# Patient Record
Sex: Female | Born: 1985 | Race: Black or African American | Hispanic: No | Marital: Single | Smoking: Never smoker
Health system: Southern US, Community
[De-identification: ages and names within clinical notes are randomized; demographics above are authoritative.]

## PROBLEM LIST (undated history)

## (undated) ENCOUNTER — Inpatient Hospital Stay (HOSPITAL_COMMUNITY): Payer: Self-pay

## (undated) DIAGNOSIS — D696 Thrombocytopenia, unspecified: Secondary | ICD-10-CM

## (undated) DIAGNOSIS — O99119 Other diseases of the blood and blood-forming organs and certain disorders involving the immune mechanism complicating pregnancy, unspecified trimester: Secondary | ICD-10-CM

## (undated) DIAGNOSIS — O24419 Gestational diabetes mellitus in pregnancy, unspecified control: Secondary | ICD-10-CM

## (undated) DIAGNOSIS — E119 Type 2 diabetes mellitus without complications: Secondary | ICD-10-CM

## (undated) DIAGNOSIS — R51 Headache: Secondary | ICD-10-CM

## (undated) DIAGNOSIS — N760 Acute vaginitis: Secondary | ICD-10-CM

## (undated) DIAGNOSIS — IMO0002 Reserved for concepts with insufficient information to code with codable children: Secondary | ICD-10-CM

## (undated) DIAGNOSIS — O1404 Mild to moderate pre-eclampsia, complicating childbirth: Secondary | ICD-10-CM

## (undated) DIAGNOSIS — Z8742 Personal history of other diseases of the female genital tract: Secondary | ICD-10-CM

## (undated) DIAGNOSIS — K429 Umbilical hernia without obstruction or gangrene: Secondary | ICD-10-CM

## (undated) DIAGNOSIS — B9689 Other specified bacterial agents as the cause of diseases classified elsewhere: Secondary | ICD-10-CM

## (undated) DIAGNOSIS — R87619 Unspecified abnormal cytological findings in specimens from cervix uteri: Secondary | ICD-10-CM

## (undated) HISTORY — DX: Mild to moderate pre-eclampsia, complicating childbirth: O14.04

## (undated) HISTORY — PX: WISDOM TOOTH EXTRACTION: SHX21

## (undated) HISTORY — DX: Type 2 diabetes mellitus without complications: E11.9

## (undated) HISTORY — DX: Personal history of other diseases of the female genital tract: Z87.42

## (undated) HISTORY — DX: Thrombocytopenia, unspecified: D69.6

## (undated) HISTORY — DX: Thrombocytopenia, unspecified: O99.119

---
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
## 2006-03-24 ENCOUNTER — Emergency Department (HOSPITAL_COMMUNITY): Admission: EM | Admit: 2006-03-24 | Discharge: 2006-03-25 | Payer: Self-pay | Admitting: Emergency Medicine

## 2006-04-09 ENCOUNTER — Emergency Department (HOSPITAL_COMMUNITY): Admission: EM | Admit: 2006-04-09 | Discharge: 2006-04-09 | Payer: Self-pay | Admitting: Family Medicine

## 2006-04-17 ENCOUNTER — Emergency Department (HOSPITAL_COMMUNITY): Admission: EM | Admit: 2006-04-17 | Discharge: 2006-04-17 | Payer: Self-pay | Admitting: *Deleted

## 2006-08-17 ENCOUNTER — Emergency Department (HOSPITAL_COMMUNITY): Admission: EM | Admit: 2006-08-17 | Discharge: 2006-08-17 | Payer: Self-pay | Admitting: Emergency Medicine

## 2006-09-03 ENCOUNTER — Emergency Department (HOSPITAL_COMMUNITY): Admission: EM | Admit: 2006-09-03 | Discharge: 2006-09-03 | Payer: Self-pay | Admitting: Emergency Medicine

## 2006-09-10 ENCOUNTER — Emergency Department (HOSPITAL_COMMUNITY): Admission: EM | Admit: 2006-09-10 | Discharge: 2006-09-10 | Payer: Self-pay | Admitting: Emergency Medicine

## 2006-12-05 ENCOUNTER — Emergency Department (HOSPITAL_COMMUNITY): Admission: EM | Admit: 2006-12-05 | Discharge: 2006-12-05 | Payer: Self-pay | Admitting: Emergency Medicine

## 2006-12-26 ENCOUNTER — Emergency Department (HOSPITAL_COMMUNITY): Admission: EM | Admit: 2006-12-26 | Discharge: 2006-12-26 | Payer: Self-pay | Admitting: Emergency Medicine

## 2007-01-26 ENCOUNTER — Emergency Department (HOSPITAL_COMMUNITY): Admission: EM | Admit: 2007-01-26 | Discharge: 2007-01-26 | Payer: Self-pay | Admitting: Emergency Medicine

## 2007-01-31 ENCOUNTER — Emergency Department (HOSPITAL_COMMUNITY): Admission: EM | Admit: 2007-01-31 | Discharge: 2007-01-31 | Payer: Self-pay | Admitting: Emergency Medicine

## 2007-04-02 ENCOUNTER — Encounter: Payer: Self-pay | Admitting: Family Medicine

## 2007-04-02 ENCOUNTER — Ambulatory Visit: Payer: Self-pay | Admitting: *Deleted

## 2007-05-13 ENCOUNTER — Emergency Department (HOSPITAL_COMMUNITY): Admission: EM | Admit: 2007-05-13 | Discharge: 2007-05-13 | Payer: Self-pay | Admitting: Emergency Medicine

## 2007-06-17 ENCOUNTER — Emergency Department (HOSPITAL_COMMUNITY): Admission: EM | Admit: 2007-06-17 | Discharge: 2007-06-17 | Payer: Self-pay | Admitting: Emergency Medicine

## 2007-09-19 ENCOUNTER — Emergency Department (HOSPITAL_COMMUNITY): Admission: EM | Admit: 2007-09-19 | Discharge: 2007-09-20 | Payer: Self-pay | Admitting: Emergency Medicine

## 2008-04-24 ENCOUNTER — Emergency Department (HOSPITAL_COMMUNITY): Admission: EM | Admit: 2008-04-24 | Discharge: 2008-04-25 | Payer: Self-pay | Admitting: Emergency Medicine

## 2008-12-16 ENCOUNTER — Encounter (INDEPENDENT_AMBULATORY_CARE_PROVIDER_SITE_OTHER): Payer: Self-pay | Admitting: Family Medicine

## 2008-12-16 ENCOUNTER — Ambulatory Visit: Payer: Self-pay | Admitting: Family Medicine

## 2008-12-17 ENCOUNTER — Ambulatory Visit (HOSPITAL_COMMUNITY): Admission: RE | Admit: 2008-12-17 | Discharge: 2008-12-17 | Payer: Self-pay | Admitting: Obstetrics and Gynecology

## 2008-12-17 ENCOUNTER — Encounter: Payer: Self-pay | Admitting: Obstetrics & Gynecology

## 2008-12-17 LAB — CONVERTED CEMR LAB
ALT: 42 units/L — ABNORMAL HIGH (ref 0–35)
AST: 29 units/L (ref 0–37)
Alkaline Phosphatase: 77 units/L (ref 39–117)
BUN: 7 mg/dL (ref 6–23)
Creatinine, Ser: 0.56 mg/dL (ref 0.40–1.20)
Free T4: 0.93 ng/dL (ref 0.80–1.80)
HCT: 39.5 % (ref 36.0–46.0)
HCV Ab: NEGATIVE
Hemoglobin: 13.1 g/dL (ref 12.0–15.0)
Hep A IgM: NEGATIVE
MCHC: 33.2 g/dL (ref 30.0–36.0)
Platelets: 154 10*3/uL (ref 150–400)
Potassium: 3.9 meq/L (ref 3.5–5.3)
RDW: 15.3 % (ref 11.5–15.5)
TSH: 1.259 microintl units/mL (ref 0.350–4.500)

## 2008-12-20 ENCOUNTER — Emergency Department (HOSPITAL_COMMUNITY): Admission: EM | Admit: 2008-12-20 | Discharge: 2008-12-21 | Payer: Self-pay | Admitting: *Deleted

## 2008-12-23 ENCOUNTER — Emergency Department (HOSPITAL_COMMUNITY): Admission: EM | Admit: 2008-12-23 | Discharge: 2008-12-23 | Payer: Self-pay | Admitting: Family Medicine

## 2009-01-06 ENCOUNTER — Ambulatory Visit: Payer: Self-pay | Admitting: Obstetrics and Gynecology

## 2009-01-07 ENCOUNTER — Encounter: Payer: Self-pay | Admitting: Obstetrics and Gynecology

## 2009-01-07 LAB — CONVERTED CEMR LAB
Basophils Absolute: 0 10*3/uL (ref 0.0–0.1)
Basophils Relative: 0 % (ref 0–1)
Eosinophils Absolute: 0.2 10*3/uL (ref 0.0–0.7)
Hgb A2 Quant: 2.8 % (ref 2.2–3.2)
Hgb A: 97.2 % (ref 96.8–97.8)
MCHC: 33 g/dL (ref 30.0–36.0)
MCV: 81.7 fL (ref 78.0–100.0)
Monocytes Relative: 4 % (ref 3–12)
Neutrophils Relative %: 72 % (ref 43–77)
Platelets: 145 10*3/uL — ABNORMAL LOW (ref 150–400)
RDW: 15.3 % (ref 11.5–15.5)

## 2009-01-19 ENCOUNTER — Emergency Department (HOSPITAL_COMMUNITY): Admission: EM | Admit: 2009-01-19 | Discharge: 2009-01-19 | Payer: Self-pay | Admitting: Emergency Medicine

## 2009-01-20 ENCOUNTER — Inpatient Hospital Stay (HOSPITAL_COMMUNITY): Admission: AD | Admit: 2009-01-20 | Discharge: 2009-01-20 | Payer: Self-pay | Admitting: Obstetrics and Gynecology

## 2009-01-26 ENCOUNTER — Ambulatory Visit: Payer: Self-pay | Admitting: Obstetrics & Gynecology

## 2009-01-26 ENCOUNTER — Encounter: Payer: Self-pay | Admitting: Obstetrics & Gynecology

## 2009-01-26 ENCOUNTER — Encounter: Payer: Self-pay | Admitting: Family

## 2009-01-28 ENCOUNTER — Ambulatory Visit (HOSPITAL_COMMUNITY): Admission: RE | Admit: 2009-01-28 | Discharge: 2009-01-28 | Payer: Self-pay | Admitting: Obstetrics & Gynecology

## 2009-02-02 ENCOUNTER — Ambulatory Visit: Payer: Self-pay | Admitting: Obstetrics and Gynecology

## 2009-02-04 ENCOUNTER — Ambulatory Visit (HOSPITAL_COMMUNITY): Admission: RE | Admit: 2009-02-04 | Discharge: 2009-02-04 | Payer: Self-pay | Admitting: Obstetrics & Gynecology

## 2009-02-14 ENCOUNTER — Ambulatory Visit: Payer: Self-pay | Admitting: Obstetrics & Gynecology

## 2009-02-15 ENCOUNTER — Encounter: Payer: Self-pay | Admitting: Obstetrics & Gynecology

## 2009-03-02 ENCOUNTER — Ambulatory Visit: Payer: Self-pay | Admitting: Obstetrics & Gynecology

## 2009-03-02 LAB — CONVERTED CEMR LAB
MCV: 80.3 fL (ref 78.0–100.0)
Platelets: 98 10*3/uL — ABNORMAL LOW (ref 150–400)
WBC: 11.9 10*3/uL — ABNORMAL HIGH (ref 4.0–10.5)

## 2009-03-04 ENCOUNTER — Ambulatory Visit (HOSPITAL_COMMUNITY): Admission: RE | Admit: 2009-03-04 | Discharge: 2009-03-04 | Payer: Self-pay | Admitting: Family Medicine

## 2009-03-15 ENCOUNTER — Encounter: Admission: RE | Admit: 2009-03-15 | Discharge: 2009-04-18 | Payer: Self-pay | Admitting: Obstetrics & Gynecology

## 2009-03-16 ENCOUNTER — Ambulatory Visit: Payer: Self-pay | Admitting: Obstetrics & Gynecology

## 2009-03-31 ENCOUNTER — Ambulatory Visit: Payer: Self-pay | Admitting: Obstetrics & Gynecology

## 2009-04-07 ENCOUNTER — Ambulatory Visit: Payer: Self-pay | Admitting: Family Medicine

## 2009-04-11 ENCOUNTER — Ambulatory Visit (HOSPITAL_COMMUNITY): Admission: RE | Admit: 2009-04-11 | Discharge: 2009-04-11 | Payer: Self-pay | Admitting: Family Medicine

## 2009-04-11 ENCOUNTER — Ambulatory Visit: Payer: Self-pay | Admitting: Obstetrics & Gynecology

## 2009-04-11 ENCOUNTER — Encounter: Payer: Self-pay | Admitting: Obstetrics & Gynecology

## 2009-04-11 LAB — CONVERTED CEMR LAB
Hemoglobin: 12.2 g/dL (ref 12.0–15.0)
Platelets: 96 10*3/uL — ABNORMAL LOW (ref 150–400)
RDW: 16 % — ABNORMAL HIGH (ref 11.5–15.5)

## 2009-04-14 ENCOUNTER — Ambulatory Visit: Payer: Self-pay | Admitting: Obstetrics & Gynecology

## 2009-04-18 ENCOUNTER — Encounter: Payer: Self-pay | Admitting: Family

## 2009-04-18 ENCOUNTER — Ambulatory Visit: Payer: Self-pay | Admitting: Obstetrics & Gynecology

## 2009-04-21 ENCOUNTER — Ambulatory Visit: Payer: Self-pay | Admitting: Obstetrics & Gynecology

## 2009-04-26 ENCOUNTER — Ambulatory Visit (HOSPITAL_COMMUNITY): Admission: RE | Admit: 2009-04-26 | Discharge: 2009-04-26 | Payer: Self-pay | Admitting: Obstetrics and Gynecology

## 2009-04-26 ENCOUNTER — Ambulatory Visit: Payer: Self-pay | Admitting: Obstetrics & Gynecology

## 2009-04-28 ENCOUNTER — Ambulatory Visit: Payer: Self-pay | Admitting: Obstetrics & Gynecology

## 2009-05-02 ENCOUNTER — Ambulatory Visit: Payer: Self-pay | Admitting: Obstetrics & Gynecology

## 2009-05-02 LAB — CONVERTED CEMR LAB: GC Probe Amp, Genital: NEGATIVE

## 2009-05-03 ENCOUNTER — Encounter: Payer: Self-pay | Admitting: Obstetrics & Gynecology

## 2009-05-05 ENCOUNTER — Observation Stay (HOSPITAL_COMMUNITY): Admission: AD | Admit: 2009-05-05 | Discharge: 2009-05-06 | Payer: Self-pay | Admitting: Family Medicine

## 2009-05-05 ENCOUNTER — Encounter: Payer: Self-pay | Admitting: Family Medicine

## 2009-05-05 ENCOUNTER — Ambulatory Visit: Payer: Self-pay | Admitting: Obstetrics & Gynecology

## 2009-05-05 ENCOUNTER — Ambulatory Visit: Payer: Self-pay | Admitting: Obstetrics and Gynecology

## 2009-05-06 ENCOUNTER — Encounter: Payer: Self-pay | Admitting: Family Medicine

## 2009-05-12 ENCOUNTER — Ambulatory Visit (HOSPITAL_COMMUNITY): Admission: RE | Admit: 2009-05-12 | Discharge: 2009-05-12 | Payer: Self-pay | Admitting: Obstetrics & Gynecology

## 2009-05-12 ENCOUNTER — Ambulatory Visit: Payer: Self-pay | Admitting: Obstetrics & Gynecology

## 2009-05-16 ENCOUNTER — Ambulatory Visit: Payer: Self-pay | Admitting: Obstetrics & Gynecology

## 2009-05-19 ENCOUNTER — Inpatient Hospital Stay (HOSPITAL_COMMUNITY): Admission: RE | Admit: 2009-05-19 | Discharge: 2009-05-21 | Payer: Self-pay | Admitting: Obstetrics & Gynecology

## 2009-05-19 ENCOUNTER — Ambulatory Visit: Payer: Self-pay | Admitting: Advanced Practice Midwife

## 2009-09-10 ENCOUNTER — Emergency Department (HOSPITAL_COMMUNITY): Admission: EM | Admit: 2009-09-10 | Discharge: 2009-09-10 | Payer: Self-pay | Admitting: Emergency Medicine

## 2009-11-24 ENCOUNTER — Emergency Department (HOSPITAL_COMMUNITY): Admission: EM | Admit: 2009-11-24 | Discharge: 2009-11-24 | Payer: Self-pay | Admitting: Family Medicine

## 2009-12-07 ENCOUNTER — Ambulatory Visit: Payer: Self-pay | Admitting: Obstetrics and Gynecology

## 2009-12-08 ENCOUNTER — Encounter: Payer: Self-pay | Admitting: Obstetrics and Gynecology

## 2009-12-08 LAB — CONVERTED CEMR LAB
HCT: 40.8 % (ref 36.0–46.0)
MCV: 81.3 fL (ref 78.0–100.0)
Platelets: 216 10*3/uL (ref 150–400)
RDW: 15.7 % — ABNORMAL HIGH (ref 11.5–15.5)
Yeast Wet Prep HPF POC: NONE SEEN

## 2010-01-04 ENCOUNTER — Ambulatory Visit: Payer: Self-pay | Admitting: Obstetrics & Gynecology

## 2010-02-22 ENCOUNTER — Ambulatory Visit: Payer: Self-pay | Admitting: Obstetrics and Gynecology

## 2010-03-01 ENCOUNTER — Ambulatory Visit: Payer: Self-pay | Admitting: Obstetrics & Gynecology

## 2010-03-01 ENCOUNTER — Other Ambulatory Visit: Admission: RE | Admit: 2010-03-01 | Discharge: 2010-03-01 | Payer: Self-pay | Admitting: Obstetrics & Gynecology

## 2010-03-15 ENCOUNTER — Ambulatory Visit: Payer: Self-pay | Admitting: Obstetrics and Gynecology

## 2010-04-20 ENCOUNTER — Ambulatory Visit: Payer: Self-pay | Admitting: Obstetrics and Gynecology

## 2010-04-21 ENCOUNTER — Encounter: Payer: Self-pay | Admitting: Obstetrics & Gynecology

## 2010-04-21 ENCOUNTER — Encounter: Payer: Self-pay | Admitting: Obstetrics and Gynecology

## 2010-04-21 LAB — CONVERTED CEMR LAB
Chlamydia, DNA Probe: POSITIVE — AB
hCG, Beta Chain, Quant, S: <2 m[IU]/mL

## 2010-04-28 ENCOUNTER — Ambulatory Visit (HOSPITAL_COMMUNITY): Admission: RE | Admit: 2010-04-28 | Discharge: 2010-04-28 | Payer: Self-pay | Admitting: Family Medicine

## 2010-06-23 ENCOUNTER — Emergency Department (HOSPITAL_COMMUNITY): Admission: EM | Admit: 2010-06-23 | Discharge: 2010-06-23 | Payer: Self-pay | Admitting: Emergency Medicine

## 2010-07-31 ENCOUNTER — Ambulatory Visit: Payer: Self-pay | Admitting: Obstetrics and Gynecology

## 2010-08-09 ENCOUNTER — Emergency Department (HOSPITAL_COMMUNITY)
Admission: EM | Admit: 2010-08-09 | Discharge: 2010-08-09 | Payer: Self-pay | Source: Home / Self Care | Admitting: Emergency Medicine

## 2010-08-16 ENCOUNTER — Ambulatory Visit: Payer: Self-pay | Admitting: Obstetrics & Gynecology

## 2010-08-20 NOTE — L&D Delivery Note (Signed)
Delivery Note At 05:59 a.m.  a viable and healthy female was delivered via controlled delivery by the RN , a vertex Presentation singleton infant  ).  APGAR:9  9, ; weight  8 lb 2 oz .  I arrived a 1 min p del, and then Placenta delivered intact, duncan Presentation, with cord blood obtained, 3-V  Cord:  with the following complications:  none .  Cord pH: n/a  Anesthesia: none  Episiotomy: none Lacerations: none  Suture Repair: none Est. Blood Loss (mL): 500  Mom to postpartum.  Baby to nursery-stable.  Loralie Malta V 03/08/2011, 6:15 AM

## 2010-08-31 ENCOUNTER — Emergency Department (HOSPITAL_COMMUNITY)
Admission: EM | Admit: 2010-08-31 | Discharge: 2010-08-31 | Payer: Self-pay | Source: Home / Self Care | Admitting: Emergency Medicine

## 2010-09-04 LAB — URINE CULTURE
Colony Count: NO GROWTH
Culture  Setup Time: 201201121458
Culture: NO GROWTH

## 2010-09-04 LAB — URINALYSIS, ROUTINE W REFLEX MICROSCOPIC
Bilirubin Urine: NEGATIVE
Hgb urine dipstick: NEGATIVE
Ketones, ur: 15 mg/dL — AB
Nitrite: NEGATIVE
Protein, ur: NEGATIVE mg/dL
Specific Gravity, Urine: 1.023 (ref 1.005–1.030)
Urine Glucose, Fasting: NEGATIVE mg/dL
Urobilinogen, UA: 1 mg/dL (ref 0.0–1.0)
pH: 7 (ref 5.0–8.0)

## 2010-09-05 ENCOUNTER — Emergency Department (HOSPITAL_COMMUNITY)
Admission: EM | Admit: 2010-09-05 | Discharge: 2010-09-05 | Payer: Self-pay | Source: Home / Self Care | Admitting: Emergency Medicine

## 2010-09-06 LAB — HEPATIC FUNCTION PANEL
ALT: 27 U/L (ref 0–35)
AST: 27 U/L (ref 0–37)
Albumin: 3 g/dL — ABNORMAL LOW (ref 3.5–5.2)
Alkaline Phosphatase: 58 U/L (ref 39–117)
Bilirubin, Direct: 0.1 mg/dL (ref 0.0–0.3)
Indirect Bilirubin: 0.5 mg/dL (ref 0.3–0.9)
Total Bilirubin: 0.6 mg/dL (ref 0.3–1.2)
Total Protein: 6.8 g/dL (ref 6.0–8.3)

## 2010-09-06 LAB — DIFFERENTIAL
Basophils Absolute: 0 10*3/uL (ref 0.0–0.1)
Basophils Relative: 0 % (ref 0–1)
Eosinophils Absolute: 0.1 10*3/uL (ref 0.0–0.7)
Eosinophils Relative: 1 % (ref 0–5)
Lymphocytes Relative: 14 % (ref 12–46)
Lymphs Abs: 1.2 10*3/uL (ref 0.7–4.0)
Monocytes Absolute: 0.5 10*3/uL (ref 0.1–1.0)
Monocytes Relative: 6 % (ref 3–12)
Neutro Abs: 7.3 10*3/uL (ref 1.7–7.7)
Neutrophils Relative %: 80 % — ABNORMAL HIGH (ref 43–77)

## 2010-09-06 LAB — CBC
HCT: 38.4 % (ref 36.0–46.0)
Hemoglobin: 13.1 g/dL (ref 12.0–15.0)
MCH: 27.7 pg (ref 26.0–34.0)
MCHC: 34.1 g/dL (ref 30.0–36.0)
MCV: 81.2 fL (ref 78.0–100.0)
Platelets: 127 10*3/uL — ABNORMAL LOW (ref 150–400)
RBC: 4.73 MIL/uL (ref 3.87–5.11)
RDW: 15.1 % (ref 11.5–15.5)
WBC: 9.1 10*3/uL (ref 4.0–10.5)

## 2010-09-06 LAB — URINALYSIS, ROUTINE W REFLEX MICROSCOPIC
Bilirubin Urine: NEGATIVE
Hgb urine dipstick: NEGATIVE
Ketones, ur: 40 mg/dL — AB
Nitrite: NEGATIVE
Protein, ur: NEGATIVE mg/dL
Specific Gravity, Urine: 1.027 (ref 1.005–1.030)
Urine Glucose, Fasting: NEGATIVE mg/dL
Urobilinogen, UA: 1 mg/dL (ref 0.0–1.0)
pH: 6 (ref 5.0–8.0)

## 2010-09-06 LAB — BASIC METABOLIC PANEL
BUN: 8 mg/dL (ref 6–23)
CO2: 25 mEq/L (ref 19–32)
Calcium: 8.8 mg/dL (ref 8.4–10.5)
Chloride: 103 mEq/L (ref 96–112)
Creatinine, Ser: 0.57 mg/dL (ref 0.4–1.2)
GFR calc Af Amer: >60 mL/min (ref >60)
GFR calc non Af Amer: >60 mL/min (ref >60)
Glucose, Bld: 127 mg/dL — ABNORMAL HIGH (ref 70–99)
Potassium: 3.3 mEq/L — ABNORMAL LOW (ref 3.5–5.1)
Sodium: 136 mEq/L (ref 135–145)

## 2010-09-06 LAB — URINE MICROSCOPIC-ADD ON

## 2010-09-06 LAB — LIPASE, BLOOD: Lipase: 19 U/L (ref 11–59)

## 2010-09-06 LAB — PREGNANCY, URINE: Preg Test, Ur: POSITIVE

## 2010-09-15 ENCOUNTER — Ambulatory Visit: Admit: 2010-09-15 | Payer: Self-pay | Admitting: Obstetrics & Gynecology

## 2010-09-17 ENCOUNTER — Inpatient Hospital Stay (HOSPITAL_COMMUNITY)
Admission: AD | Admit: 2010-09-17 | Discharge: 2010-09-17 | Payer: Self-pay | Source: Home / Self Care | Attending: Obstetrics & Gynecology | Admitting: Obstetrics & Gynecology

## 2010-09-17 LAB — CBC
MCH: 27.1 pg (ref 26.0–34.0)
Platelets: 128 10*3/uL — ABNORMAL LOW (ref 150–400)
RBC: 4.73 MIL/uL (ref 3.87–5.11)
RDW: 15 % (ref 11.5–15.5)
WBC: 9.7 10*3/uL (ref 4.0–10.5)

## 2010-09-17 LAB — DIFFERENTIAL
Basophils Relative: 0 % (ref 0–1)
Eosinophils Absolute: 0.1 10*3/uL (ref 0.0–0.7)
Monocytes Relative: 5 % (ref 3–12)
Neutrophils Relative %: 73 % (ref 43–77)

## 2010-09-17 LAB — WET PREP, GENITAL: Yeast Wet Prep HPF POC: NONE SEEN

## 2010-09-17 LAB — URINALYSIS, ROUTINE W REFLEX MICROSCOPIC
Protein, ur: NEGATIVE mg/dL
Urobilinogen, UA: 0.2 mg/dL (ref 0.0–1.0)

## 2010-09-18 LAB — GC/CHLAMYDIA PROBE AMP, GENITAL
Chlamydia, DNA Probe: NEGATIVE
GC Probe Amp, Genital: NEGATIVE

## 2010-09-27 ENCOUNTER — Other Ambulatory Visit: Payer: Self-pay | Admitting: Family Medicine

## 2010-09-27 DIAGNOSIS — E669 Obesity, unspecified: Secondary | ICD-10-CM

## 2010-10-11 ENCOUNTER — Ambulatory Visit (HOSPITAL_COMMUNITY)
Admission: RE | Admit: 2010-10-11 | Discharge: 2010-10-11 | Disposition: A | Discharge status: Home / Self Care | Payer: Medicaid Other | Source: Ambulatory Visit | Attending: Family Medicine | Admitting: Family Medicine

## 2010-10-11 DIAGNOSIS — O358XX0 Maternal care for other (suspected) fetal abnormality and damage, not applicable or unspecified: Secondary | ICD-10-CM | POA: Insufficient documentation

## 2010-10-11 DIAGNOSIS — Z363 Encounter for antenatal screening for malformations: Secondary | ICD-10-CM | POA: Insufficient documentation

## 2010-10-11 DIAGNOSIS — E669 Obesity, unspecified: Secondary | ICD-10-CM | POA: Insufficient documentation

## 2010-10-11 DIAGNOSIS — O99210 Obesity complicating pregnancy, unspecified trimester: Secondary | ICD-10-CM | POA: Insufficient documentation

## 2010-10-11 DIAGNOSIS — Z1389 Encounter for screening for other disorder: Secondary | ICD-10-CM | POA: Insufficient documentation

## 2010-10-30 LAB — URINE CULTURE: Colony Count: 50000

## 2010-10-30 LAB — HCG, QUANTITATIVE, PREGNANCY: hCG, Beta Chain, Quant, S: 75275 m[IU]/mL — ABNORMAL HIGH (ref <5)

## 2010-10-30 LAB — URINALYSIS, ROUTINE W REFLEX MICROSCOPIC
Bilirubin Urine: NEGATIVE
Hgb urine dipstick: NEGATIVE
Nitrite: NEGATIVE
Protein, ur: NEGATIVE mg/dL
Urobilinogen, UA: 0.2 mg/dL (ref 0.0–1.0)

## 2010-10-30 LAB — DIFFERENTIAL
Eosinophils Relative: 1 % (ref 0–5)
Lymphocytes Relative: 26 % (ref 12–46)
Lymphs Abs: 2.2 10*3/uL (ref 0.7–4.0)
Monocytes Absolute: 0.5 10*3/uL (ref 0.1–1.0)
Monocytes Relative: 6 % (ref 3–12)

## 2010-10-30 LAB — CBC
HCT: 38.4 % (ref 36.0–46.0)
Hemoglobin: 12.9 g/dL (ref 12.0–15.0)
MCV: 82.1 fL (ref 78.0–100.0)
RBC: 4.68 MIL/uL (ref 3.87–5.11)
WBC: 8.4 10*3/uL (ref 4.0–10.5)

## 2010-10-30 LAB — GC/CHLAMYDIA PROBE AMP, GENITAL: Chlamydia, DNA Probe: NEGATIVE

## 2010-10-31 LAB — URINALYSIS, ROUTINE W REFLEX MICROSCOPIC
Bilirubin Urine: NEGATIVE
Hgb urine dipstick: NEGATIVE
Ketones, ur: NEGATIVE mg/dL
Protein, ur: NEGATIVE mg/dL
Urobilinogen, UA: 0.2 mg/dL (ref 0.0–1.0)

## 2010-10-31 LAB — DIFFERENTIAL
Basophils Relative: 0 % (ref 0–1)
Eosinophils Absolute: 0.1 10*3/uL (ref 0.0–0.7)
Eosinophils Relative: 2 % (ref 0–5)
Monocytes Absolute: 0.5 10*3/uL (ref 0.1–1.0)
Monocytes Relative: 6 % (ref 3–12)
Neutrophils Relative %: 49 % (ref 43–77)

## 2010-10-31 LAB — CBC
HCT: 39.6 % (ref 36.0–46.0)
Hemoglobin: 13.4 g/dL (ref 12.0–15.0)
RDW: 15 % (ref 11.5–15.5)
WBC: 7.7 10*3/uL (ref 4.0–10.5)

## 2010-10-31 LAB — GC/CHLAMYDIA PROBE AMP, GENITAL
Chlamydia, DNA Probe: NEGATIVE
GC Probe Amp, Genital: NEGATIVE

## 2010-10-31 LAB — COMPREHENSIVE METABOLIC PANEL
ALT: 22 U/L (ref 0–35)
Alkaline Phosphatase: 61 U/L (ref 39–117)
Glucose, Bld: 160 mg/dL — ABNORMAL HIGH (ref 70–99)
Potassium: 3.8 mEq/L (ref 3.5–5.1)
Sodium: 140 mEq/L (ref 135–145)
Total Protein: 6.8 g/dL (ref 6.0–8.3)

## 2010-11-02 LAB — POCT PREGNANCY, URINE: Preg Test, Ur: NEGATIVE

## 2010-11-03 ENCOUNTER — Other Ambulatory Visit: Payer: Self-pay | Admitting: Family Medicine

## 2010-11-03 DIAGNOSIS — Z1389 Encounter for screening for other disorder: Secondary | ICD-10-CM

## 2010-11-05 LAB — DIFFERENTIAL
Basophils Absolute: 0 10*3/uL (ref 0.0–0.1)
Eosinophils Absolute: 0.2 10*3/uL (ref 0.0–0.7)
Lymphocytes Relative: 32 % (ref 12–46)
Monocytes Relative: 7 % (ref 3–12)
Neutro Abs: 4.6 10*3/uL (ref 1.7–7.7)
Neutrophils Relative %: 59 % (ref 43–77)

## 2010-11-05 LAB — URINALYSIS, ROUTINE W REFLEX MICROSCOPIC
Bilirubin Urine: NEGATIVE
Glucose, UA: NEGATIVE mg/dL
Hgb urine dipstick: NEGATIVE
Protein, ur: NEGATIVE mg/dL
Urobilinogen, UA: 1 mg/dL (ref 0.0–1.0)

## 2010-11-05 LAB — POCT I-STAT, CHEM 8
BUN: 8 mg/dL (ref 6–23)
Creatinine, Ser: 0.7 mg/dL (ref 0.4–1.2)
Hemoglobin: 15 g/dL (ref 12.0–15.0)
Potassium: 3.5 mEq/L (ref 3.5–5.1)
Sodium: 140 mEq/L (ref 135–145)

## 2010-11-05 LAB — WET PREP, GENITAL
Trich, Wet Prep: NONE SEEN
WBC, Wet Prep HPF POC: NONE SEEN

## 2010-11-05 LAB — POCT PREGNANCY, URINE: Preg Test, Ur: NEGATIVE

## 2010-11-05 LAB — HCG, SERUM, QUALITATIVE: Preg, Serum: NEGATIVE

## 2010-11-05 LAB — CBC
Hemoglobin: 13.9 g/dL (ref 12.0–15.0)
RBC: 4.96 MIL/uL (ref 3.87–5.11)
RDW: 15.5 % (ref 11.5–15.5)

## 2010-11-05 LAB — GC/CHLAMYDIA PROBE AMP, GENITAL
Chlamydia, DNA Probe: NEGATIVE
GC Probe Amp, Genital: NEGATIVE

## 2010-11-07 LAB — POCT PREGNANCY, URINE: Preg Test, Ur: NEGATIVE

## 2010-11-16 ENCOUNTER — Ambulatory Visit (HOSPITAL_COMMUNITY)
Admission: RE | Admit: 2010-11-16 | Discharge: 2010-11-16 | Disposition: A | Discharge status: Home / Self Care | Payer: Medicaid Other | Source: Ambulatory Visit | Attending: Family Medicine | Admitting: Family Medicine

## 2010-11-16 DIAGNOSIS — Z3689 Encounter for other specified antenatal screening: Secondary | ICD-10-CM | POA: Insufficient documentation

## 2010-11-16 DIAGNOSIS — Z1389 Encounter for screening for other disorder: Secondary | ICD-10-CM

## 2010-11-16 DIAGNOSIS — O3660X0 Maternal care for excessive fetal growth, unspecified trimester, not applicable or unspecified: Secondary | ICD-10-CM | POA: Insufficient documentation

## 2010-11-23 LAB — CBC
HCT: 38 % (ref 36.0–46.0)
HCT: 38.3 % (ref 36.0–46.0)
Hemoglobin: 12.5 g/dL (ref 12.0–15.0)
Platelets: 64 10*3/uL — ABNORMAL LOW (ref 150–400)
RBC: 4.43 MIL/uL (ref 3.87–5.11)
RDW: 15.9 % — ABNORMAL HIGH (ref 11.5–15.5)
WBC: 10.5 10*3/uL (ref 4.0–10.5)
WBC: 12.8 10*3/uL — ABNORMAL HIGH (ref 4.0–10.5)

## 2010-11-23 LAB — DIFFERENTIAL
Eosinophils Relative: 0 % (ref 0–5)
Lymphocytes Relative: 16 % (ref 12–46)
Lymphs Abs: 2 10*3/uL (ref 0.7–4.0)
Monocytes Relative: 7 % (ref 3–12)

## 2010-11-24 LAB — GLUCOSE, CAPILLARY
Glucose-Capillary: 101 mg/dL — ABNORMAL HIGH (ref 70–99)
Glucose-Capillary: 76 mg/dL (ref 70–99)
Glucose-Capillary: 90 mg/dL (ref 70–99)

## 2010-11-24 LAB — POCT URINALYSIS DIP (DEVICE)
Bilirubin Urine: NEGATIVE
Bilirubin Urine: NEGATIVE
Glucose, UA: NEGATIVE mg/dL
Hgb urine dipstick: NEGATIVE
Ketones, ur: NEGATIVE mg/dL
Nitrite: NEGATIVE
Protein, ur: NEGATIVE mg/dL
Specific Gravity, Urine: 1.02 (ref 1.005–1.030)
Specific Gravity, Urine: 1.025 (ref 1.005–1.030)
Urobilinogen, UA: 0.2 mg/dL (ref 0.0–1.0)
pH: 6.5 (ref 5.0–8.0)

## 2010-11-24 LAB — CBC
HCT: 41.3 % (ref 36.0–46.0)
Hemoglobin: 13.6 g/dL (ref 12.0–15.0)
MCV: 85.2 fL (ref 78.0–100.0)
Platelets: 60 10*3/uL — ABNORMAL LOW (ref 150–400)
RDW: 15.8 % — ABNORMAL HIGH (ref 11.5–15.5)

## 2010-11-25 LAB — POCT URINALYSIS DIP (DEVICE)
Bilirubin Urine: NEGATIVE
Bilirubin Urine: NEGATIVE
Bilirubin Urine: NEGATIVE
Glucose, UA: NEGATIVE mg/dL
Glucose, UA: NEGATIVE mg/dL
Glucose, UA: NEGATIVE mg/dL
Glucose, UA: NEGATIVE mg/dL
Hgb urine dipstick: NEGATIVE
Ketones, ur: NEGATIVE mg/dL
Ketones, ur: NEGATIVE mg/dL
Ketones, ur: NEGATIVE mg/dL
Nitrite: NEGATIVE
Nitrite: NEGATIVE
Specific Gravity, Urine: 1.025 (ref 1.005–1.030)
Specific Gravity, Urine: 1.025 (ref 1.005–1.030)
Urobilinogen, UA: 0.2 mg/dL (ref 0.0–1.0)
pH: 6.5 (ref 5.0–8.0)
pH: 6.5 (ref 5.0–8.0)

## 2010-11-26 LAB — POCT URINALYSIS DIP (DEVICE)
Bilirubin Urine: NEGATIVE
Bilirubin Urine: NEGATIVE
Glucose, UA: NEGATIVE mg/dL
Hgb urine dipstick: NEGATIVE
Ketones, ur: NEGATIVE mg/dL
Ketones, ur: NEGATIVE mg/dL
Nitrite: NEGATIVE
Specific Gravity, Urine: 1.025 (ref 1.005–1.030)
Specific Gravity, Urine: 1.025 (ref 1.005–1.030)
pH: 6 (ref 5.0–8.0)
pH: 6 (ref 5.0–8.0)

## 2010-11-27 LAB — POCT URINALYSIS DIP (DEVICE)
Bilirubin Urine: NEGATIVE
Glucose, UA: NEGATIVE mg/dL
Glucose, UA: NEGATIVE mg/dL
Hgb urine dipstick: NEGATIVE
Hgb urine dipstick: NEGATIVE
Ketones, ur: NEGATIVE mg/dL
Protein, ur: 30 mg/dL — AB
Specific Gravity, Urine: 1.025 (ref 1.005–1.030)
Specific Gravity, Urine: 1.025 (ref 1.005–1.030)
pH: 6 (ref 5.0–8.0)

## 2010-11-28 LAB — ABO/RH: ABO/RH(D): A POS

## 2010-11-28 LAB — GC/CHLAMYDIA PROBE AMP, GENITAL
Chlamydia, DNA Probe: POSITIVE — AB
GC Probe Amp, Genital: NEGATIVE

## 2010-11-28 LAB — URINALYSIS, ROUTINE W REFLEX MICROSCOPIC
Bilirubin Urine: NEGATIVE
Glucose, UA: NEGATIVE mg/dL
Hgb urine dipstick: NEGATIVE
Specific Gravity, Urine: 1.031 — ABNORMAL HIGH (ref 1.005–1.030)

## 2010-11-28 LAB — TYPE AND SCREEN: ABO/RH(D): A POS

## 2010-11-28 LAB — COMPREHENSIVE METABOLIC PANEL
Alkaline Phosphatase: 71 U/L (ref 39–117)
BUN: 6 mg/dL (ref 6–23)
Calcium: 8.9 mg/dL (ref 8.4–10.5)
Creatinine, Ser: 0.55 mg/dL (ref 0.4–1.2)
Glucose, Bld: 95 mg/dL (ref 70–99)
Total Protein: 6.2 g/dL (ref 6.0–8.3)

## 2010-11-28 LAB — LIPASE, BLOOD: Lipase: 22 U/L (ref 11–59)

## 2010-11-28 LAB — WET PREP, GENITAL: Trich, Wet Prep: NONE SEEN

## 2010-11-28 LAB — URINE CULTURE: Colony Count: 40000

## 2010-11-28 LAB — POCT PREGNANCY, URINE: Preg Test, Ur: POSITIVE

## 2010-12-09 ENCOUNTER — Emergency Department (HOSPITAL_COMMUNITY)
Admission: EM | Admit: 2010-12-09 | Discharge: 2010-12-09 | Disposition: A | Discharge status: Home / Self Care | Payer: Medicaid Other | Attending: Emergency Medicine | Admitting: Emergency Medicine

## 2010-12-09 DIAGNOSIS — X58XXXA Exposure to other specified factors, initial encounter: Secondary | ICD-10-CM | POA: Insufficient documentation

## 2010-12-09 DIAGNOSIS — H5710 Ocular pain, unspecified eye: Secondary | ICD-10-CM | POA: Insufficient documentation

## 2010-12-09 DIAGNOSIS — O99891 Other specified diseases and conditions complicating pregnancy: Secondary | ICD-10-CM | POA: Insufficient documentation

## 2010-12-09 DIAGNOSIS — S058X9A Other injuries of unspecified eye and orbit, initial encounter: Secondary | ICD-10-CM | POA: Insufficient documentation

## 2011-01-02 NOTE — Group Therapy Note (Signed)
NAMECHASADY, LONGWELL NO.:  [id number]   MEDICAL RECORD NO.:  [id number]          PATIENT TYPE:  WOC   LOCATION:  [facility]                   FACILITY:  [facility]   PHYSICIAN:  Allie Bossier, MD        DATE OF BIRTH:  17-Jun-1986   DATE OF SERVICE:  12/16/2008                                  CLINIC NOTE   REASON FOR VISIT:  Annual exam.   HISTORY OF PRESENT ILLNESS:  [name] is a 25 year old  nulligravida patient here for her annual Pap smear examination.  She  relates a history of abnormal menstrual cycles both in time and  duration.  She notes her last cycle was approximately 4 months ago.  She  relates when she does have cycles, they are heavy and accompanied with  moderate cramping.  Also of note, the patient relates that she is not  using any form of birth control and having a partner for the last year  and having unprotected sex without getting pregnant to the best of her  knowledge.   PAST MEDICAL HISTORY:  Negative.   PAST SURGICAL HISTORY:  Negative test.   PAST OBSTETRICAL HISTORY:  Negative.   PAST GYN HISTORY:  She has a history of abnormal Pap smear and  colposcopy which was then followed up by Pap smears.  She also relates a  history of gonorrhea.   ALLERGIES:  she has no known drug allergies.   SOCIAL HISTORY:  She denies any tobacco or alcohol use.   REVIEW OF SYSTEMS:  Negative.   PHYSICAL EXAM:  VITAL SIGNS:  Reviewed and they are normal.  She is 5  feet 6 inches tall and weighs 256 pounds, which corresponds to a BMI of  41.3.  She is healthy-appearing in no acute distress.  NECK:  Supple without evidence of adenopathy or thyromegaly.  LUNGS:  Clear.  CARDIOVASCULAR EXAM:  Regular without murmur.  ABDOMEN:  Soft, obese, nontender, no hepatosplenomegaly appreciated.   GU EXAM:  Her external female genitalia are normal-appearing.  Her  vagina shows normal rugae.  Her cervix is nulliparous, without lesion.  There is some mild  mucoid vaginal discharge.  Bimanual exam:  It was  difficult to feel any of her internal organs due to her body habitus.   ASSESSMENT/PLAN:  1. Well-woman exam.  2. Menometrorrhagia.  I have a  high suspicion that this is probably      related to polycystic ovarian syndrome given the patient's habitus      and her infertility as well as her abnormal menstrual cycles.  I      will check CBC, CMP and TSH today and have the patient return in 2      weeks.  If the labs are unremarkable, I would consider starting her      on metformin 5 mg twice daily to increase to 1000 mg after 1 month.      Pap smear was also collected today and the patient will be notified      of the results.     ______________________________  Monique Schmidt, D.O.  ______________________________  Allie Bossier, MD    MC/MEDQ  D:  12/16/2008  T:  12/16/2008  Job:  [id number]   cc:   Allie Bossier, MD

## 2011-01-02 NOTE — Group Therapy Note (Signed)
NAME:  Monique Schmidt, EVERITT NO.:  [id number]   MEDICAL RECORD NO.:  [id number]          PATIENT TYPE:  WOC   LOCATION[facility]  [facility]                   FACILITY:  [facility]   PHYSICIAN:  Wilburt Finlay, M.D.     DATE OF BIRTH:  05-27-1986   DATE OF SERVICE:  04/02/2007                                  CLINIC NOTE   CHIEF COMPLAINT:  The patient presents for routine GYN exam.  Also  thinks she might be pregnant.   HISTORY OF PRESENT ILLNESS:  The patient is a 25 year old nulligravida  female who presents for annual GYN exam.  The patient also thinks that  she may be pregnant.  Reports is the partner of 4 months and has been  trying to get pregnant.  She reportedly has regular periods up until  last month when she had a period for 1 day, has not had a period since.  Has taken 2 home pregnancy tests, 1 of which was positive, the other  negative.  Pregnancy test done here at the office was negative.  The  patient denies any abnormal vaginal discharge, no vaginal itching, no  breast tenderness, no significant leaking within the last couple of  months.  She reports using condoms sometimes for contraception.  Has  never tried any other form of birth control in the past.  Her  gynecological history is significant for an abnormal Pap smear in  February, 2007, which she had done at a private physician's office.  Reports having a colposcopy done that same month and a repeat Pap smear  that was normal.  She may have had a biopsy of the cervix done.  It was  not clear on questioning of the patient.   PAST MEDICAL HISTORY:  Noncontributory.   PAST SURGICAL HISTORY:  Noncontributory.   FAMILY HISTORY:  Significant for diabetes in her mother and father.  Father with a heart attack, high blood pressure and the mother with  throat cancer.   SOCIAL HISTORY:  The patient lives by herself.  Denies smoking, drugs or  alcohol use.   ALLERGIES:  No known drug allergies.   CURRENT MEDICATIONS:   None.   REVIEW OF SYSTEMS:  Negative for no nausea, vomiting, no fevers, chills,  no abnormal abdominal pain, no chest pain, shortness of breath.  No  abnormal vaginal bleeding other than 1 abnormal period last month.  No  abnormal vaginal discharge.   ON EXAM:  VITAL SIGNS:  Her temperature is 98.4, pulse 84, blood  pressure 123/86, weight 281.3 lb, height 5 feet 6 inches.  GENERAL:  The patient appears in no apparent distress.  Is morbidly  obese.  HEENT:  Within normal limits.  CV:  Regular rate and rhythm.  No murmurs appreciated.  LUNGS:  Clear to auscultation bilaterally.  ABDOMEN:  Soft, nontender, very obese.  No palpable masses appreciated.  PELVIC:  Normal external genitalia.  Normal vaginal mucosa, Normal  cervix.  No cervical motion tenderness noted.  No adnexal fullness,  masses or tenderness noted.  Uterus appears to be small and mobile,  although somewhat limited by body habitus.  BREAST:  The patient with very large breasts, appeared nontender and no  masses __________ .  EXTREMITIES:  No edema.   ASSESSMENT/PLAN:  A 25 year old nulligravida female in for routine  gynecological exam.   PLAN:  Pregnancy test done today was negative.  Pap smear, gonorrhea,  chlamydia done today.  The patient encouraged to take multivitamin daily  including folic acid since she does intend to become pregnant.  She is  to increase her exercise level and try to lose some weight.  The patient  is also to keep a record of her menstrual periods, especially since she  plans on becoming pregnant.  If her amenorrhea continues and the patient  is concerned, she is asked to follow up with [place] to further evaluate her  amenorrhea.  Otherwise, return to clinic as needed.           ______________________________  Wilburt Finlay, M.D.     LJ/MEDQ  D:  04/02/2007  T:  04/03/2007  Job:  [id number]

## 2011-01-02 NOTE — Assessment & Plan Note (Signed)
NAME:  ALYZE, LAUF NO.:  [id number]   MEDICAL RECORD NO.:  [id number]          PATIENT TYPE:  WOC   LOCATION:  [facility]         FACILITY:  [facility]   PHYSICIAN:  Caren Griffins, CNM       DATE OF BIRTH:  1986/03/12   DATE OF SERVICE:  03/15/2010                                  CLINIC NOTE   REASON FOR VISIT:  Results.   HISTORY:  This is a 25 year old G1, P1-0-0-1, who has a 27-month-old and  is morbidly obese.  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  Her  endocervix showed benign endocervical glands.  These results were shared  with her and explained and she understands the need to get another Pap  smear in 6 months.   She is very anxious about becoming pregnant.  She is wanting to become  pregnant.  She says her baby daddy wants her to become pregnant as soon  as possible and she has been trying for 5-6 months to no avail.   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  Followup Pap in 6 months, had a long discussion with  her about the benefits of waiting until she has a normal Pap smear  before attempting pregnancy.  However, she is against doing this and for  that reason is given a prescription for prenatal vitamins to begin  taking one a day.  We also explained ovulation and gave her a  prescription for basal body temperature if she wants to do that.  We  also discussed the benefits of weight loss and the risk of morbid  obesity in pregnancy.  She agrees to return for another Pap in 6 months.           ______________________________  Caren Griffins, CNM     DP/MEDQ  D:  03/15/2010  T:  03/16/2010  Job:  [id number]

## 2011-01-31 ENCOUNTER — Encounter: Payer: Medicaid Other | Attending: Obstetrics and Gynecology

## 2011-01-31 DIAGNOSIS — O99810 Abnormal glucose complicating pregnancy: Secondary | ICD-10-CM | POA: Insufficient documentation

## 2011-01-31 DIAGNOSIS — Z713 Dietary counseling and surveillance: Secondary | ICD-10-CM | POA: Insufficient documentation

## 2011-02-02 ENCOUNTER — Inpatient Hospital Stay (HOSPITAL_COMMUNITY)
Admission: AD | Admit: 2011-02-02 | Discharge: 2011-02-03 | Disposition: A | Discharge status: Home / Self Care | Payer: Medicaid Other | Source: Ambulatory Visit | Attending: Obstetrics and Gynecology | Admitting: Obstetrics and Gynecology

## 2011-02-02 DIAGNOSIS — O2390 Unspecified genitourinary tract infection in pregnancy, unspecified trimester: Secondary | ICD-10-CM

## 2011-02-02 DIAGNOSIS — N390 Urinary tract infection, site not specified: Secondary | ICD-10-CM | POA: Insufficient documentation

## 2011-02-02 LAB — URINALYSIS, ROUTINE W REFLEX MICROSCOPIC
Ketones, ur: 15 mg/dL — AB
Nitrite: NEGATIVE
pH: 6.5 (ref 5.0–8.0)

## 2011-02-02 LAB — WET PREP, GENITAL: Yeast Wet Prep HPF POC: NONE SEEN

## 2011-02-02 LAB — URINE MICROSCOPIC-ADD ON

## 2011-02-03 LAB — GC/CHLAMYDIA PROBE AMP, GENITAL: Chlamydia, DNA Probe: NEGATIVE

## 2011-02-05 ENCOUNTER — Encounter: Payer: Medicaid Other | Admitting: Dietician

## 2011-02-05 ENCOUNTER — Other Ambulatory Visit: Payer: Self-pay | Admitting: Obstetrics & Gynecology

## 2011-02-05 DIAGNOSIS — O99810 Abnormal glucose complicating pregnancy: Secondary | ICD-10-CM

## 2011-02-05 DIAGNOSIS — IMO0002 Reserved for concepts with insufficient information to code with codable children: Secondary | ICD-10-CM

## 2011-02-05 LAB — URINE CULTURE
Colony Count: 85000
Culture  Setup Time: 201206160117

## 2011-02-05 LAB — POCT URINALYSIS DIP (DEVICE)
Glucose, UA: NEGATIVE mg/dL
Nitrite: NEGATIVE
Protein, ur: NEGATIVE mg/dL
Urobilinogen, UA: 0.2 mg/dL (ref 0.0–1.0)
pH: 6.5 (ref 5.0–8.0)

## 2011-02-08 ENCOUNTER — Other Ambulatory Visit: Payer: Self-pay | Admitting: Obstetrics & Gynecology

## 2011-02-08 ENCOUNTER — Other Ambulatory Visit: Payer: Medicaid Other

## 2011-02-08 DIAGNOSIS — O99340 Other mental disorders complicating pregnancy, unspecified trimester: Secondary | ICD-10-CM

## 2011-02-08 DIAGNOSIS — O24419 Gestational diabetes mellitus in pregnancy, unspecified control: Secondary | ICD-10-CM

## 2011-02-08 LAB — STREP B DNA PROBE: GBS: NEGATIVE

## 2011-02-12 ENCOUNTER — Other Ambulatory Visit: Payer: Self-pay | Admitting: Family Medicine

## 2011-02-12 DIAGNOSIS — O99340 Other mental disorders complicating pregnancy, unspecified trimester: Secondary | ICD-10-CM

## 2011-02-12 LAB — POCT URINALYSIS DIP (DEVICE)
Hgb urine dipstick: NEGATIVE
Nitrite: NEGATIVE
Protein, ur: 30 mg/dL — AB
Urobilinogen, UA: 0.2 mg/dL (ref 0.0–1.0)
pH: 6.5 (ref 5.0–8.0)

## 2011-02-15 ENCOUNTER — Ambulatory Visit (HOSPITAL_COMMUNITY): Payer: Medicaid Other

## 2011-02-15 ENCOUNTER — Other Ambulatory Visit: Payer: Medicaid Other

## 2011-02-15 ENCOUNTER — Ambulatory Visit (HOSPITAL_COMMUNITY)
Admission: RE | Admit: 2011-02-15 | Discharge: 2011-02-15 | Disposition: A | Discharge status: Home / Self Care | Payer: Medicaid Other | Source: Ambulatory Visit | Attending: Obstetrics & Gynecology | Admitting: Obstetrics & Gynecology

## 2011-02-15 DIAGNOSIS — O3660X0 Maternal care for excessive fetal growth, unspecified trimester, not applicable or unspecified: Secondary | ICD-10-CM | POA: Insufficient documentation

## 2011-02-15 DIAGNOSIS — O99810 Abnormal glucose complicating pregnancy: Secondary | ICD-10-CM | POA: Insufficient documentation

## 2011-02-15 DIAGNOSIS — O24419 Gestational diabetes mellitus in pregnancy, unspecified control: Secondary | ICD-10-CM

## 2011-02-19 ENCOUNTER — Encounter: Payer: Medicaid Other | Attending: Obstetrics and Gynecology | Admitting: Dietician

## 2011-02-19 ENCOUNTER — Other Ambulatory Visit: Payer: Medicaid Other

## 2011-02-19 ENCOUNTER — Other Ambulatory Visit: Payer: Self-pay | Admitting: Obstetrics & Gynecology

## 2011-02-19 DIAGNOSIS — O0930 Supervision of pregnancy with insufficient antenatal care, unspecified trimester: Secondary | ICD-10-CM

## 2011-02-19 DIAGNOSIS — O99810 Abnormal glucose complicating pregnancy: Secondary | ICD-10-CM | POA: Insufficient documentation

## 2011-02-19 DIAGNOSIS — Z713 Dietary counseling and surveillance: Secondary | ICD-10-CM | POA: Insufficient documentation

## 2011-02-19 LAB — POCT URINALYSIS DIP (DEVICE)
Nitrite: NEGATIVE
Protein, ur: NEGATIVE mg/dL
pH: 6.5 (ref 5.0–8.0)

## 2011-02-22 ENCOUNTER — Other Ambulatory Visit: Payer: Medicaid Other

## 2011-02-22 DIAGNOSIS — O99810 Abnormal glucose complicating pregnancy: Secondary | ICD-10-CM

## 2011-02-26 ENCOUNTER — Other Ambulatory Visit: Payer: Self-pay | Admitting: Obstetrics & Gynecology

## 2011-02-26 ENCOUNTER — Ambulatory Visit: Payer: Medicaid Other | Admitting: Family Medicine

## 2011-02-26 DIAGNOSIS — O99810 Abnormal glucose complicating pregnancy: Secondary | ICD-10-CM

## 2011-02-26 DIAGNOSIS — O0930 Supervision of pregnancy with insufficient antenatal care, unspecified trimester: Secondary | ICD-10-CM

## 2011-02-26 LAB — POCT URINALYSIS DIP (DEVICE)
Protein, ur: NEGATIVE mg/dL
Specific Gravity, Urine: 1.02 (ref 1.005–1.030)
Urobilinogen, UA: 0.2 mg/dL (ref 0.0–1.0)
pH: 6.5 (ref 5.0–8.0)

## 2011-03-01 ENCOUNTER — Ambulatory Visit (INDEPENDENT_AMBULATORY_CARE_PROVIDER_SITE_OTHER): Payer: Medicaid Other | Admitting: Family Medicine

## 2011-03-01 DIAGNOSIS — O99810 Abnormal glucose complicating pregnancy: Secondary | ICD-10-CM

## 2011-03-01 LAB — FETAL NONSTRESS TEST

## 2011-03-05 ENCOUNTER — Ambulatory Visit (HOSPITAL_COMMUNITY)
Admission: RE | Admit: 2011-03-05 | Discharge: 2011-03-05 | Disposition: A | Discharge status: Home / Self Care | Payer: Medicaid Other | Source: Ambulatory Visit | Attending: Family Medicine | Admitting: Family Medicine

## 2011-03-05 ENCOUNTER — Other Ambulatory Visit: Payer: Self-pay | Admitting: Family Medicine

## 2011-03-05 ENCOUNTER — Ambulatory Visit (INDEPENDENT_AMBULATORY_CARE_PROVIDER_SITE_OTHER): Payer: Medicaid Other | Admitting: Family Medicine

## 2011-03-05 DIAGNOSIS — O99810 Abnormal glucose complicating pregnancy: Secondary | ICD-10-CM

## 2011-03-05 DIAGNOSIS — O368390 Maternal care for abnormalities of the fetal heart rate or rhythm, unspecified trimester, not applicable or unspecified: Secondary | ICD-10-CM | POA: Insufficient documentation

## 2011-03-05 DIAGNOSIS — IMO0002 Reserved for concepts with insufficient information to code with codable children: Secondary | ICD-10-CM

## 2011-03-05 DIAGNOSIS — O24919 Unspecified diabetes mellitus in pregnancy, unspecified trimester: Secondary | ICD-10-CM

## 2011-03-05 DIAGNOSIS — Z3689 Encounter for other specified antenatal screening: Secondary | ICD-10-CM | POA: Insufficient documentation

## 2011-03-05 LAB — POCT URINALYSIS DIP (DEVICE)
Hgb urine dipstick: NEGATIVE
Ketones, ur: NEGATIVE mg/dL
Protein, ur: NEGATIVE mg/dL
pH: 6 (ref 5.0–8.0)

## 2011-03-07 ENCOUNTER — Inpatient Hospital Stay (HOSPITAL_COMMUNITY): Admission: RE | Admit: 2011-03-07 | Payer: Medicaid Other | Source: Ambulatory Visit

## 2011-03-07 ENCOUNTER — Encounter (HOSPITAL_COMMUNITY): Payer: Self-pay | Admitting: *Deleted

## 2011-03-07 NOTE — Plan of Care (Signed)
Spoke with Dorene Grebe, CNM regarding patient being here for induction of labor prior to her scheduled time and bed availability. Per Dorene Grebe, OK to go have some dinner and check back afterwards. Will decide the plan after she returns from dinner.

## 2011-03-08 ENCOUNTER — Inpatient Hospital Stay (HOSPITAL_COMMUNITY)
Admission: AD | Admit: 2011-03-08 | Discharge: 2011-03-10 | DRG: 774 | Disposition: A | Discharge status: Home / Self Care | Payer: Medicaid Other | Source: Ambulatory Visit | Attending: Obstetrics and Gynecology | Admitting: Obstetrics and Gynecology

## 2011-03-08 ENCOUNTER — Encounter (HOSPITAL_COMMUNITY): Payer: Self-pay | Admitting: *Deleted

## 2011-03-08 DIAGNOSIS — D689 Coagulation defect, unspecified: Secondary | ICD-10-CM | POA: Diagnosis not present

## 2011-03-08 DIAGNOSIS — D696 Thrombocytopenia, unspecified: Secondary | ICD-10-CM | POA: Diagnosis not present

## 2011-03-08 DIAGNOSIS — O9913 Other diseases of the blood and blood-forming organs and certain disorders involving the immune mechanism complicating the puerperium: Secondary | ICD-10-CM | POA: Diagnosis not present

## 2011-03-08 DIAGNOSIS — O99814 Abnormal glucose complicating childbirth: Secondary | ICD-10-CM

## 2011-03-08 HISTORY — DX: Gestational diabetes mellitus in pregnancy, unspecified control: O24.419

## 2011-03-08 HISTORY — DX: Reserved for concepts with insufficient information to code with codable children: IMO0002

## 2011-03-08 HISTORY — DX: Unspecified abnormal cytological findings in specimens from cervix uteri: R87.619

## 2011-03-08 LAB — CBC
HCT: 37.3 % (ref 36.0–46.0)
HCT: 39.4 % (ref 36.0–46.0)
Hemoglobin: 13.2 g/dL (ref 12.0–15.0)
MCHC: 33.5 g/dL (ref 30.0–36.0)
Platelets: 60 10*3/uL — ABNORMAL LOW (ref 150–400)
RBC: 4.68 MIL/uL (ref 3.87–5.11)
RBC: 4.95 MIL/uL (ref 3.87–5.11)
RDW: 15.9 % — ABNORMAL HIGH (ref 11.5–15.5)
WBC: 8.2 10*3/uL (ref 4.0–10.5)

## 2011-03-08 LAB — COMPREHENSIVE METABOLIC PANEL
Alkaline Phosphatase: 162 U/L — ABNORMAL HIGH (ref 39–117)
BUN: 10 mg/dL (ref 6–23)
CO2: 20 mEq/L (ref 19–32)
GFR calc Af Amer: >60 mL/min (ref >60)
GFR calc non Af Amer: >60 mL/min (ref >60)
Glucose, Bld: 152 mg/dL — ABNORMAL HIGH (ref 70–99)
Potassium: 3.5 mEq/L (ref 3.5–5.1)
Total Bilirubin: 0.2 mg/dL — ABNORMAL LOW (ref 0.3–1.2)
Total Protein: 7.1 g/dL (ref 6.0–8.3)

## 2011-03-08 LAB — GLUCOSE, CAPILLARY
Glucose-Capillary: 209 mg/dL — ABNORMAL HIGH (ref 70–99)
Glucose-Capillary: 148 mg/dL — ABNORMAL HIGH (ref 70–99)
Glucose-Capillary: 175 mg/dL — ABNORMAL HIGH (ref 70–99)
Glucose-Capillary: 128 mg/dL — ABNORMAL HIGH (ref 70–99)

## 2011-03-08 LAB — RPR: RPR: NONREACTIVE

## 2011-03-08 MED ORDER — TERBUTALINE SULFATE 1 MG/ML IJ SOLN: 0.2500 mg | Freq: Once | INTRAMUSCULAR | Status: DC | PRN

## 2011-03-08 MED ORDER — ONDANSETRON HCL 4 MG/2ML IJ SOLN: 4.0000 mg | INTRAMUSCULAR | Status: DC | PRN

## 2011-03-08 MED ORDER — LACTATED RINGERS IV SOLN: 500.0000 mL | Freq: Once | INTRAVENOUS | Status: DC

## 2011-03-08 MED ORDER — BENZOCAINE-MENTHOL 20-0.5 % EX AERO
1.0000 "application " | INHALATION_SPRAY | CUTANEOUS | Status: DC | PRN
  Administered 2011-03-08: 19:00:00 via TOPICAL

## 2011-03-08 MED ORDER — ACETAMINOPHEN 325 MG PO TABS: 650.0000 mg | ORAL_TABLET | ORAL | Status: DC | PRN

## 2011-03-08 MED ORDER — EPHEDRINE 5 MG/ML INJ
10.0000 mg | INTRAVENOUS | Status: DC | PRN
  Filled 2011-03-08: qty 4

## 2011-03-08 MED ORDER — ONDANSETRON HCL 4 MG/2ML IJ SOLN: 4.0000 mg | Freq: Four times a day (QID) | INTRAMUSCULAR | Status: DC | PRN

## 2011-03-08 MED ORDER — DEXTROSE IN LACTATED RINGERS 5 % IV SOLN: INTRAVENOUS | Status: DC | PRN

## 2011-03-08 MED ORDER — SODIUM CHLORIDE 0.9 % IV SOLN
INTRAVENOUS | Status: DC
  Administered 2011-03-08: 0.8 [IU]/h via INTRAVENOUS
  Filled 2011-03-08: qty 1

## 2011-03-08 MED ORDER — SODIUM CHLORIDE 0.9 % IJ SOLN: 3.0000 mL | INTRAMUSCULAR | Status: DC | PRN

## 2011-03-08 MED ORDER — PRENATAL PLUS 27-1 MG PO TABS
1.0000 | ORAL_TABLET | Freq: Every day | ORAL | Status: DC
  Administered 2011-03-08 – 2011-03-10 (×3): 1 via ORAL
  Filled 2011-03-08 (×3): qty 1

## 2011-03-08 MED ORDER — SODIUM CHLORIDE 0.9 % IV SOLN: 250.0000 mL | INTRAVENOUS | Status: DC

## 2011-03-08 MED ORDER — LACTATED RINGERS IV SOLN
INTRAVENOUS | Status: DC
  Administered 2011-03-08: 500 mL via INTRAVENOUS
  Administered 2011-03-08 (×2): 1000 mL via INTRAVENOUS

## 2011-03-08 MED ORDER — LANOLIN HYDROUS EX OINT: TOPICAL_OINTMENT | CUTANEOUS | Status: DC | PRN

## 2011-03-08 MED ORDER — OXYTOCIN 20 UNITS IN LACTATED RINGERS INFUSION - SIMPLE: 125.0000 mL/h | INTRAVENOUS | Status: DC | PRN

## 2011-03-08 MED ORDER — SIMETHICONE 80 MG PO CHEW: 80.0000 mg | CHEWABLE_TABLET | ORAL | Status: DC | PRN

## 2011-03-08 MED ORDER — LIDOCAINE HCL (PF) 1 % IJ SOLN
30.0000 mL | INTRAMUSCULAR | Status: DC | PRN
  Filled 2011-03-08 (×2): qty 30

## 2011-03-08 MED ORDER — DEXTROSE 50 % IV SOLN: 25.0000 mL | INTRAVENOUS | Status: DC | PRN

## 2011-03-08 MED ORDER — OXYCODONE-ACETAMINOPHEN 5-325 MG PO TABS
1.0000 | ORAL_TABLET | ORAL | Status: DC | PRN
  Administered 2011-03-08: 2 via ORAL
  Administered 2011-03-08 – 2011-03-09 (×2): 1 via ORAL
  Administered 2011-03-09: 2 via ORAL
  Administered 2011-03-09 – 2011-03-10 (×5): 1 via ORAL
  Filled 2011-03-08: qty 1
  Filled 2011-03-08: qty 2
  Filled 2011-03-08 (×5): qty 1
  Filled 2011-03-08: qty 2
  Filled 2011-03-08: qty 1

## 2011-03-08 MED ORDER — DEXTROSE-NACL 5-0.45 % IV SOLN: INTRAVENOUS | Status: DC

## 2011-03-08 MED ORDER — PHENYLEPHRINE 40 MCG/ML (10ML) SYRINGE FOR IV PUSH (FOR BLOOD PRESSURE SUPPORT)
80.0000 ug | PREFILLED_SYRINGE | INTRAVENOUS | Status: DC | PRN
  Filled 2011-03-08: qty 5

## 2011-03-08 MED ORDER — SODIUM CHLORIDE 0.9 % IV SOLN: INTRAVENOUS | Status: DC

## 2011-03-08 MED ORDER — ZOLPIDEM TARTRATE 10 MG PO TABS
10.0000 mg | ORAL_TABLET | Freq: Every evening | ORAL | Status: DC | PRN
  Administered 2011-03-08: 10 mg via ORAL
  Filled 2011-03-08: qty 1

## 2011-03-08 MED ORDER — MISOPROSTOL 25 MCG QUARTER TABLET
25.0000 ug | ORAL_TABLET | ORAL | Status: DC | PRN
  Administered 2011-03-08: 25 ug via VAGINAL
  Filled 2011-03-08: qty 0.25
  Filled 2011-03-08: qty 1

## 2011-03-08 MED ORDER — IBUPROFEN 600 MG PO TABS
600.0000 mg | ORAL_TABLET | Freq: Four times a day (QID) | ORAL | Status: DC
  Administered 2011-03-08 – 2011-03-10 (×7): 600 mg via ORAL
  Filled 2011-03-08 (×6): qty 1

## 2011-03-08 MED ORDER — SENNOSIDES-DOCUSATE SODIUM 8.6-50 MG PO TABS
1.0000 | ORAL_TABLET | Freq: Every day | ORAL | Status: DC
Start: 2011-03-08 — End: 2011-03-10
  Administered 2011-03-08: 2 via ORAL

## 2011-03-08 MED ORDER — TETANUS-DIPHTH-ACELL PERTUSSIS 5-2.5-18.5 LF-MCG/0.5 IM SUSP
0.5000 mL | Freq: Once | INTRAMUSCULAR | Status: DC
  Filled 2011-03-08: qty 0.5

## 2011-03-08 MED ORDER — ZOLPIDEM TARTRATE 5 MG PO TABS: 5.0000 mg | ORAL_TABLET | Freq: Every evening | ORAL | Status: DC | PRN

## 2011-03-08 MED ORDER — FLEET ENEMA 7-19 GM/118ML RE ENEM: 1.0000 | ENEMA | RECTAL | Status: DC | PRN

## 2011-03-08 MED ORDER — WITCH HAZEL-GLYCERIN EX PADS: MEDICATED_PAD | CUTANEOUS | Status: DC | PRN

## 2011-03-08 MED ORDER — LACTATED RINGERS IV SOLN
500.0000 mL | INTRAVENOUS | Status: AC | PRN
  Administered 2011-03-08: 500 mL via INTRAVENOUS

## 2011-03-08 MED ORDER — OXYTOCIN 10 UNIT/ML IJ SOLN
INTRAMUSCULAR | Status: AC
  Administered 2011-03-08: 10 [IU] via INTRAMUSCULAR
  Filled 2011-03-08: qty 1

## 2011-03-08 MED ORDER — BENZOCAINE-MENTHOL 20-0.5 % EX AERO
INHALATION_SPRAY | CUTANEOUS | Status: AC
  Filled 2011-03-08: qty 56

## 2011-03-08 MED ORDER — SODIUM CHLORIDE 0.9 % IJ SOLN: 3.0000 mL | Freq: Two times a day (BID) | INTRAMUSCULAR | Status: DC

## 2011-03-08 MED ORDER — DIPHENHYDRAMINE HCL 25 MG PO CAPS
25.0000 mg | ORAL_CAPSULE | Freq: Four times a day (QID) | ORAL | Status: DC | PRN
  Administered 2011-03-08 (×2): 25 mg via ORAL
  Filled 2011-03-08 (×2): qty 1

## 2011-03-08 MED ORDER — IBUPROFEN 600 MG PO TABS
600.0000 mg | ORAL_TABLET | Freq: Four times a day (QID) | ORAL | Status: DC | PRN
  Administered 2011-03-08 (×2): 600 mg via ORAL
  Filled 2011-03-08 (×2): qty 1

## 2011-03-08 MED ORDER — FENTANYL 2.5 MCG/ML BUPIVACAINE 1/10 % EPIDURAL INFUSION (WH - ANES): 14.0000 mL/h | INTRAMUSCULAR | Status: DC

## 2011-03-08 MED ORDER — CITRIC ACID-SODIUM CITRATE 334-500 MG/5ML PO SOLN: 30.0000 mL | ORAL | Status: DC | PRN

## 2011-03-08 MED ORDER — DIPHENHYDRAMINE HCL 50 MG/ML IJ SOLN: 12.5000 mg | INTRAMUSCULAR | Status: DC | PRN

## 2011-03-08 MED ORDER — ONDANSETRON HCL 4 MG PO TABS: 4.0000 mg | ORAL_TABLET | ORAL | Status: DC | PRN

## 2011-03-08 MED ORDER — SODIUM CHLORIDE 0.9 % IV SOLN
INTRAVENOUS | Status: DC
  Filled 2011-03-08: qty 1

## 2011-03-08 MED ORDER — OXYTOCIN 20 UNITS IN LACTATED RINGERS INFUSION - SIMPLE
125.0000 mL/h | Freq: Once | INTRAVENOUS | Status: DC
  Filled 2011-03-08: qty 1000

## 2011-03-08 MED ORDER — OXYCODONE-ACETAMINOPHEN 5-325 MG PO TABS
2.0000 | ORAL_TABLET | ORAL | Status: DC | PRN
  Administered 2011-03-08 (×2): 2 via ORAL
  Filled 2011-03-08 (×2): qty 2

## 2011-03-08 NOTE — Progress Notes (Signed)
Called Dr. Darrol Angel re. Pt's CBG  175.  She Said "no orders, continue to watch."

## 2011-03-08 NOTE — H&P (Signed)
  Subjective:  Monique Schmidt is a 25 y.o. [id number] female with [facility] 03/14/11 at 39.[redacted]weeks gestation who is being admitted for induction due to GDM A2.  Her current obstetrical history is significant for GDM A2, on glyburide 5mg  bid.  Patient reports some mild contractions. Good fetal movement. Denies any bleeding, discharge or leaking of fluid.     Objective:  pt comfortable.  Vital signs in last 24 hours: Temp:  [98.2 F (36.8 C)] 98.2 F (36.8 C) (07/19 0138) Pulse Rate:  [89] 89  (07/19 0138) Resp:  [20] 20  (07/19 0138) BP: (125)/(80) 125/80 mmHg (07/19 0138) Weight:  [286 lb (129.729 kg)] 286 lb (129.729 kg) (07/19 0138)   General:   alert and cooperative, obese  Skin:   normal  HEENT:  PERRLA and extra ocular movement intact  Lungs:   clear to auscultation bilaterally  Heart:   regular rate and rhythm, S1, S2 normal, no murmur, click, rub or gallop  Breasts:   deferred  Abdomen:  gravid  Pelvis:  deferred  FHT:  150 BPM  Uterine Size: deferred  Presentations: vertex  Cervix:    Dilation: 3cm   Effacement: 50%   Station:  -3   Consistency: medium   Position: posterior   Lab Review  A, Rh -, Rubella-immune, Hepatitis B surface antigen non-reactive, GBS negative, RPR NR  One hour GTT: 214.   Assessment/Plan:  [id number] weeks gestation who presents for induction of labor due to GDM A2. 1. Start cytotec. 2. Expectant management.  3. GDM: pt to be put on glucose stabilizer protocol.    Risks, benefits, alternatives and possible complications have been discussed in detail with the patient.  Pre-admission, admission, and post admission procedures and expectations were discussed in detail.  All questions answered, all appropriate consents will be signed at the Hospital. Admission is planned for today.

## 2011-03-09 LAB — CBC
MCH: 26.3 pg (ref 26.0–34.0)
MCHC: 33 g/dL (ref 30.0–36.0)
Platelets: 72 10*3/uL — ABNORMAL LOW (ref 150–400)
RBC: 4.64 MIL/uL (ref 3.87–5.11)
RDW: 15.8 % — ABNORMAL HIGH (ref 11.5–15.5)

## 2011-03-09 LAB — GLUCOSE, CAPILLARY: Glucose-Capillary: 125 mg/dL — ABNORMAL HIGH (ref 70–99)

## 2011-03-09 NOTE — Progress Notes (Signed)
Mother has very large breast infant maintains latch well . inst mother to use washcloth or rolled blanket under breast to assist with breast support. X- cradle hold on pillow works well . Encourage mom to pre pump before feeding to stimulate nipple. Encouraged freq skin to skin , mom has introduced bottle but states infant didn't like it.

## 2011-03-09 NOTE — Progress Notes (Signed)
UR chart review completed.  

## 2011-03-09 NOTE — Progress Notes (Signed)
Post Partum Day 1 Subjective: no complaints  Objective: Blood pressure 129/95, pulse 87, temperature 98.1 F (36.7 C), temperature source Oral, resp. rate 18, height 5\' 6"  (1.676 m), weight 129.729 kg (286 lb), unknown if currently breastfeeding.  Physical Exam:  General: alert, cooperative and no distress Lochia: appropriate Uterine Fundus: firm DVT Evaluation: No evidence of DVT seen on physical exam.   Basename 03/09/11 0515 03/08/11 0552  HGB 12.2 13.2  HCT 37.0 39.4    Assessment/Plan: Plan for discharge tomorrow, Breastfeeding, Lactation consult and Contraception POPs   LOS: 1 day   Apple Dearmas S 03/09/2011, 7:34 AM

## 2011-03-10 LAB — GLUCOSE, CAPILLARY: Glucose-Capillary: 184 mg/dL — ABNORMAL HIGH (ref 70–99)

## 2011-03-10 MED ORDER — SENNOSIDES-DOCUSATE SODIUM 8.6-50 MG PO TABS: 1.0000 | ORAL_TABLET | Freq: Every day | ORAL | Status: DC

## 2011-03-10 MED ORDER — METFORMIN HCL 500 MG PO TABS: 500.0000 mg | ORAL_TABLET | Freq: Two times a day (BID) | ORAL | Status: DC

## 2011-03-10 MED ORDER — IBUPROFEN 600 MG PO TABS: 600.0000 mg | ORAL_TABLET | Freq: Four times a day (QID) | ORAL | Status: AC

## 2011-03-10 MED ORDER — NORETHINDRONE 0.35 MG PO TABS: 1.0000 | ORAL_TABLET | Freq: Every day | ORAL | Status: DC

## 2011-03-10 NOTE — Discharge Summary (Signed)
  Obstetric Discharge Summary Reason for Admission: onset of labor Prenatal Procedures: NST and ultrasound Intrapartum Procedures: spontaneous vaginal delivery Postpartum Procedures: none Complications-Operative and Postpartum: none  Hemoglobin  Date Value Range Status  03/09/2011 12.2  12.0-15.0 (g/dL) Final     HCT  Date Value Range Status  03/09/2011 37.0  36.0-46.0 (%) Final    Discharge Diagnoses: Term Pregnancy-delivered GDM-possible pregestational DM, thrombocytopenia-resolving  Discharge Information: Date: 03/10/2011 Activity: pelvic rest Diet: routine Medications: PNV, Ibuprophen, Colace and micronor, metformin Condition: stable Instructions: refer to practice specific booklet Discharge to: home Follow-up Information    [facility]. Make an appointment in 6 weeks. (postpartum check. )          Newborn Data: Live born  Information for the patient's newborn:  Danny, Zimny [[id number]]  female  ; APGAR 9/9 , ; weight 8 lb 2 oz ;  Home with mother.  Aquan Kope 03/10/2011, 6:42 AM

## 2011-03-10 NOTE — Progress Notes (Signed)
Post Partum Day 2 Subjective: no complaints, tolerating PO, + flatus and +BM, breast and bottlefeeding, minimal lochia, desires OCPs for pp contraception.  Objective: Blood pressure 102/73, pulse 102, temperature 98.6 F (37 C), temperature source Oral, resp. rate 18, height 5\' 6"  (1.676 m), weight 129.729 kg (286 lb), unknown if currently breastfeeding.  Physical Exam:  General: alert and cooperative Lochia: appropriate Chest: CTAB Heart: RRR no m/r/g Uterine Fundus: firm at umbilicus DVT Evaluation: No evidence of DVT seen on physical exam.   Basename 03/09/11 0515 03/08/11 0552  HGB 12.2 13.2  HCT 37.0 39.4    Assessment/Plan: Discharge home Will discharge home with metformin BID for likely pregestational DM due to CBGs pp being greater than 140.  Thrombocytopenia slightly improved.  Will recheck at pp visit.    LOS: 2 days   Monique Schmidt 03/10/2011, 6:31 AM

## 2011-04-11 ENCOUNTER — Ambulatory Visit: Payer: Medicaid Other | Admitting: Family Medicine

## 2011-04-16 ENCOUNTER — Ambulatory Visit (INDEPENDENT_AMBULATORY_CARE_PROVIDER_SITE_OTHER): Payer: Medicaid Other | Admitting: Family Medicine

## 2011-04-16 ENCOUNTER — Other Ambulatory Visit (HOSPITAL_COMMUNITY)
Admission: RE | Admit: 2011-04-16 | Discharge: 2011-04-16 | Disposition: A | Discharge status: Home / Self Care | Payer: Medicaid Other | Source: Ambulatory Visit | Attending: Family Medicine | Admitting: Family Medicine

## 2011-04-16 ENCOUNTER — Encounter: Payer: Self-pay | Admitting: Family Medicine

## 2011-04-16 DIAGNOSIS — Z113 Encounter for screening for infections with a predominantly sexual mode of transmission: Secondary | ICD-10-CM | POA: Insufficient documentation

## 2011-04-16 DIAGNOSIS — E119 Type 2 diabetes mellitus without complications: Secondary | ICD-10-CM

## 2011-04-16 DIAGNOSIS — R87612 Low grade squamous intraepithelial lesion on cytologic smear of cervix (LGSIL): Secondary | ICD-10-CM

## 2011-04-16 DIAGNOSIS — Z01419 Encounter for gynecological examination (general) (routine) without abnormal findings: Secondary | ICD-10-CM | POA: Insufficient documentation

## 2011-04-16 LAB — POCT PREGNANCY, URINE: Preg Test, Ur: NEGATIVE

## 2011-04-16 NOTE — Progress Notes (Signed)
Patient was seen and examined with PA-S Deniece Portela, agree with her progress note dated 04/16/2011.  Patient had unprotected intercourse approximately 1 week ago. Pregnancy test today was negative. Will have patient return to clinic for repeat pregnancy test and if negative will give Depo shot. Pt instructed to use barrier methods if she has intercourse between now and then. She v/u and agrees.  Patient was late to prenatal care, and did not have a pap during pregnancy. Was LSIL 12/2009 with LSIL on colpo in 02/2010 without additional f/u. Pap done today as noted. Pt will have additional testing/treatment as warranted per pap findings.

## 2011-04-16 NOTE — Progress Notes (Signed)
After the allotted 3 minutes for urine HCG, it was noted to be positive instead of negative. I tried calling all the numbers listed in patient's demographics to ask her to come by for a blood quantitative HCG test. I was unable to reach the patient directly. I left a message with the emergency contact to have her ask the patient to contact our clinic, but she stated that she does not know the patient's new cell phone number. Clinic staff aware of my trying to reach the patient regarding her HCG test results.

## 2011-04-16 NOTE — Progress Notes (Signed)
°  Subjective:     Monique Schmidt is a 25 y.o. female who presents for a postpartum visit. She is 6 weeks postpartum following induction of labor for gestational diabetes with vaginal delivery. I have fully reviewed the prenatal and intrapartum course. The delivery was at 39.1 gestational weeks. Outcome: spontaneous vaginal delivery. Anesthesia: none. Postpartum course has been without complications. Baby's course has been normal. Baby is feeding by bottle. Bleeding no bleeding. Bowel function is normal. Bladder function is normal. Patient is sexually active. Contraception method is none, was taking pills but aunt threw away, is now wanting a depo shot.. Postpartum depression screening: negative.  The following portions of the patient's history were reviewed and updated as appropriate: allergies, current medications, past family history, past medical history, past social history, past surgical history and problem list.  Review of Systems Pertinent items are noted in HPI.   Objective:    Temp(Src) 98.4 F (36.9 C) (Oral)   Ht 5\' 6"  (1.676 m)   Wt 122.063 kg (269 lb 1.6 oz)   BMI 43.43 kg/m2   Breastfeeding? No  General:  alert, cooperative and no distress     Lungs: clear to auscultation bilaterally  Heart:  regular rate and rhythm, S1, S2 normal, no murmur, click, rub or gallop  Abdomen: soft, non-tender; bowel sounds normal; no masses,  no organomegaly   Vulva:  normal  Vagina: normal vagina, no discharge, exudate, lesion, or erythema  Cervix:  no bleeding following Pap, no cervical motion tenderness and no lesions  Corpus: normal  Adnexa:  normal adnexa           Assessment:    6 week postpartum exam. Pap smear done at today's visit.   Plan:    1. Contraception: planning Depo upon second negative pregnancy test, see above 2. Pap smear was done, due to previous abnormal pap 3. Follow up in: 1 week for Depo pending negative pregnancy test or as needed.

## 2011-04-16 NOTE — Patient Instructions (Signed)
Your pregnancy test was negative today. Do not have unprotected intercourse for one week, then return for repeat pregnancy test and Depo shot as you voiced interest in today.  De Quervain's Disease Suzette Battiest disease is a condition often seen in racquet sports where there is a soreness (inflammation) in the cord like structures (tendons) which attach muscle to bone on the thumb side of the wrist. There may be a tightening of the tissues around the tendons. This condition is often helped by giving up or modifying the activity which caused it. When conservative treatment does not help, surgery may be required. Conservative treatment could include changes in the activity which brought about the problem or made it worse. Anti-inflammatory medications and injections may be used to help decrease the inflammation and help with pain control. Your caregiver will help you determine which is best for you. DIAGNOSIS Often the diagnosis (learning what is wrong) can be made by examination. Sometimes x-rays are required. HOME CARE INSTRUCTIONS  Apply ice to the sore area for 20 minutes, 3 times per day while awake. Put the ice in a plastic bag and place a towel between the bag of ice and your skin. This is especially helpful if it can be done after all activities involving the sore wrist.   Temporary splinting may help.   Only take over-the-counter or prescription medicines for pain, discomfort or fever as directed by your caregiver.  SEEK MEDICAL CARE IF:  Pain relief is not obtained with medications, or if you have increasing pain and seem to be getting worse rather than better.  MAKE SURE YOU:   Understand these instructions.   Will watch your condition.   Will get help right away if you are not doing well or get worse.  Document Released: 05/01/2001 Document Re-Released: 08/28/2009 [facility] Patient Information 2011 [place], [place].

## 2011-04-19 ENCOUNTER — Telehealth: Payer: Self-pay | Admitting: Family Medicine

## 2011-04-19 DIAGNOSIS — Z3009 Encounter for other general counseling and advice on contraception: Secondary | ICD-10-CM

## 2011-04-19 NOTE — Telephone Encounter (Signed)
This patient's pap test was normal. We need to contact her regarding her positive pregnancy test and that she needs to come in for a blood test. She is scheduled to come in on 9/4 for repeat urine test and possible depo, but because her urine pregnancy test became positive just after the 3 minutes x 2 tries, we need to get a blood pregnancy test. Please call her and have her come to the clinic for this test, as ordered above. Thanks!

## 2011-04-20 NOTE — Telephone Encounter (Signed)
Pt had returned call earlier and was aware of result. Pt is scheduled on Tues and will do lab work.

## 2011-04-24 ENCOUNTER — Other Ambulatory Visit: Payer: Medicaid Other

## 2011-04-25 ENCOUNTER — Telehealth: Payer: Self-pay | Admitting: *Deleted

## 2011-04-25 ENCOUNTER — Other Ambulatory Visit: Payer: Medicaid Other

## 2011-04-25 NOTE — Telephone Encounter (Signed)
Pt did not show for her lab draw for the beta hcg today. Attempted to call patient all of her numbers did not work. Called her emergency contact Romanie and she gave [place] another number for her [phone]. Called patient at this number but got no answer and no voicemail. Will attempt to call again later.

## 2011-04-26 NOTE — Telephone Encounter (Signed)
Called patient home number and heard a message this number was not valid. Called patient mobile number , also heard message number not valid. Called patient work number and was told she is not there. Called pt. Contact 1 number and told by a female to call the number she gave [place] yesterday. Called that number and was unable to leave a message- no answer and no answering machine or voicemail picked up.

## 2011-04-30 NOTE — Telephone Encounter (Signed)
Attempted to call pt. No answer, no voicemail.  

## 2011-05-02 ENCOUNTER — Encounter: Payer: Self-pay | Admitting: Obstetrics and Gynecology

## 2011-05-02 NOTE — Telephone Encounter (Signed)
Called pt and phone was not answered, unable to leave message. Will forward to front office to send patient telling her she missed her lab appointment and it is very important she come in asap to get it drawn (needs bhcg) after letter is sent please send this back to clinical pool so we can decide if we need to continue to call.

## 2011-05-10 LAB — CBC
HCT: 41.4
Hemoglobin: 13.7
MCV: 82.2
RDW: 15.5
WBC: 9.4

## 2011-05-10 LAB — DIFFERENTIAL
Eosinophils Absolute: 0.6
Eosinophils Relative: 6 — ABNORMAL HIGH
Lymphocytes Relative: 27
Lymphs Abs: 2.6
Monocytes Absolute: 0.6

## 2011-05-10 LAB — I-STAT 8, (EC8 V) (CONVERTED LAB)
Bicarbonate: 27 — ABNORMAL HIGH
Glucose, Bld: 107 — ABNORMAL HIGH
TCO2: 28
pCO2, Ven: 45.8
pH, Ven: 7.379 — ABNORMAL HIGH

## 2011-05-10 LAB — WET PREP, GENITAL
Trich, Wet Prep: NONE SEEN
WBC, Wet Prep HPF POC: NONE SEEN
Yeast Wet Prep HPF POC: NONE SEEN

## 2011-05-10 LAB — URINALYSIS, ROUTINE W REFLEX MICROSCOPIC
Bilirubin Urine: NEGATIVE
Specific Gravity, Urine: 1.022
pH: 6.5

## 2011-05-10 LAB — PREGNANCY, URINE: Preg Test, Ur: NEGATIVE

## 2011-05-10 LAB — URINE MICROSCOPIC-ADD ON

## 2011-05-10 LAB — POCT I-STAT CREATININE: Operator id: 282201

## 2011-05-16 ENCOUNTER — Inpatient Hospital Stay (INDEPENDENT_AMBULATORY_CARE_PROVIDER_SITE_OTHER)
Admission: RE | Admit: 2011-05-16 | Discharge: 2011-05-16 | Disposition: A | Discharge status: Home / Self Care | Payer: Medicaid Other | Source: Ambulatory Visit | Attending: Family Medicine | Admitting: Family Medicine

## 2011-05-16 ENCOUNTER — Emergency Department (HOSPITAL_COMMUNITY)
Admission: EM | Admit: 2011-05-16 | Discharge: 2011-05-16 | Discharge status: Against Medical Advice | Payer: Medicaid Other

## 2011-05-16 DIAGNOSIS — L03039 Cellulitis of unspecified toe: Secondary | ICD-10-CM

## 2011-05-23 LAB — POCT PREGNANCY, URINE: Preg Test, Ur: NEGATIVE

## 2011-05-23 LAB — URINALYSIS, ROUTINE W REFLEX MICROSCOPIC
Bilirubin Urine: NEGATIVE
Glucose, UA: NEGATIVE
Ketones, ur: 15 — AB
Protein, ur: NEGATIVE

## 2011-05-23 LAB — DIFFERENTIAL
Basophils Absolute: 0
Lymphs Abs: 1.1
Monocytes Relative: 1 — ABNORMAL LOW

## 2011-05-23 LAB — CBC
HCT: 46.9 — ABNORMAL HIGH
Platelets: 234
RDW: 14.1

## 2011-05-23 LAB — URINE CULTURE
Colony Count: NO GROWTH
Culture: NO GROWTH

## 2011-05-23 LAB — URINE MICROSCOPIC-ADD ON

## 2011-05-30 LAB — URINALYSIS, ROUTINE W REFLEX MICROSCOPIC
Hgb urine dipstick: NEGATIVE
Nitrite: NEGATIVE
Protein, ur: NEGATIVE
Specific Gravity, Urine: 1.03
Urobilinogen, UA: 0.2

## 2011-05-30 LAB — DIFFERENTIAL
Basophils Absolute: 0.1
Basophils Relative: 1
Lymphocytes Relative: 29
Monocytes Absolute: 0.5
Monocytes Relative: 5
Neutro Abs: 7
Neutrophils Relative %: 64

## 2011-05-30 LAB — CBC
HCT: 44.9
Hemoglobin: 14.7
MCV: 83.7
Platelets: 216
RDW: 15.3 — ABNORMAL HIGH

## 2011-05-30 LAB — COMPREHENSIVE METABOLIC PANEL
Albumin: 3.8
Alkaline Phosphatase: 88
BUN: 7
Creatinine, Ser: 0.66
Glucose, Bld: 105 — ABNORMAL HIGH
Total Bilirubin: 0.7
Total Protein: 7.8

## 2011-05-31 LAB — I-STAT 8, (EC8 V) (CONVERTED LAB)
Acid-Base Excess: 2
Chloride: 104
HCT: 46
Operator id: 270111
Potassium: 3.6
Sodium: 141
TCO2: 31
pH, Ven: 7.347 — ABNORMAL HIGH

## 2011-05-31 LAB — URINALYSIS, ROUTINE W REFLEX MICROSCOPIC
Ketones, ur: NEGATIVE
Nitrite: NEGATIVE
Protein, ur: NEGATIVE
Urobilinogen, UA: 1

## 2011-05-31 LAB — POCT I-STAT CREATININE
Creatinine, Ser: 0.8
Operator id: 270111

## 2011-05-31 LAB — URINE MICROSCOPIC-ADD ON

## 2011-05-31 LAB — WET PREP, GENITAL
WBC, Wet Prep HPF POC: NONE SEEN
Yeast Wet Prep HPF POC: NONE SEEN

## 2011-05-31 LAB — GC/CHLAMYDIA PROBE AMP, GENITAL: GC Probe Amp, Genital: NEGATIVE

## 2011-06-04 LAB — POCT PREGNANCY, URINE
Operator id: 297281
Preg Test, Ur: NEGATIVE

## 2011-07-03 ENCOUNTER — Other Ambulatory Visit: Payer: Medicaid Other

## 2011-07-05 ENCOUNTER — Other Ambulatory Visit: Payer: Medicaid Other

## 2011-07-05 LAB — HCG, QUANTITATIVE, PREGNANCY: hCG, Beta Chain, Quant, S: <2 m[IU]/mL

## 2011-07-06 ENCOUNTER — Telehealth: Payer: Self-pay | Admitting: *Deleted

## 2011-07-06 NOTE — Telephone Encounter (Signed)
Called pt and informed her of test results from yesterday and that she may have her Depo-provera injection.  Pt states that this same thing happened before where she had negative blood pregnancy tests and then was actually pregnant. She is concerned that it will happen again. I told pt that if she would like to abstain from intercourse completely for a month, we can repeat her blood pregnancy teat and then decide what to do. Pt stated that she would like to do that. She also asked for an appt to check for BV as she says she is having vaginal d/c and odor. I gave her an appt for 07/09/11 @ 1300 and she will discuss the pregnancy test/depo situation with the provider as well. Pt voiced understanding.

## 2011-07-06 NOTE — Telephone Encounter (Signed)
Message copied by Jill Side on Fri Jul 06, 2011 11:33 AM ------      Message from: August Luz      Created: Fri Jul 06, 2011 10:07 AM       Pt can have depo

## 2011-07-09 ENCOUNTER — Ambulatory Visit: Payer: Medicaid Other | Admitting: Physician Assistant

## 2011-07-20 ENCOUNTER — Emergency Department (HOSPITAL_COMMUNITY)
Admission: EM | Admit: 2011-07-20 | Discharge: 2011-07-20 | Disposition: A | Discharge status: Home / Self Care | Payer: Medicaid Other | Attending: Emergency Medicine | Admitting: Emergency Medicine

## 2011-07-20 ENCOUNTER — Emergency Department (HOSPITAL_COMMUNITY): Payer: Medicaid Other

## 2011-07-20 ENCOUNTER — Encounter (HOSPITAL_COMMUNITY): Payer: Self-pay | Admitting: *Deleted

## 2011-07-20 DIAGNOSIS — J069 Acute upper respiratory infection, unspecified: Secondary | ICD-10-CM | POA: Insufficient documentation

## 2011-07-20 DIAGNOSIS — R5381 Other malaise: Secondary | ICD-10-CM | POA: Insufficient documentation

## 2011-07-20 DIAGNOSIS — B9789 Other viral agents as the cause of diseases classified elsewhere: Secondary | ICD-10-CM | POA: Insufficient documentation

## 2011-07-20 DIAGNOSIS — B349 Viral infection, unspecified: Secondary | ICD-10-CM

## 2011-07-20 DIAGNOSIS — R5383 Other fatigue: Secondary | ICD-10-CM | POA: Insufficient documentation

## 2011-07-20 DIAGNOSIS — R05 Cough: Secondary | ICD-10-CM | POA: Insufficient documentation

## 2011-07-20 DIAGNOSIS — R059 Cough, unspecified: Secondary | ICD-10-CM | POA: Insufficient documentation

## 2011-07-20 LAB — URINE MICROSCOPIC-ADD ON

## 2011-07-20 LAB — URINALYSIS, ROUTINE W REFLEX MICROSCOPIC
Bilirubin Urine: NEGATIVE
Glucose, UA: NEGATIVE mg/dL
Ketones, ur: NEGATIVE mg/dL
Nitrite: NEGATIVE
Protein, ur: NEGATIVE mg/dL
pH: 6.5 (ref 5.0–8.0)

## 2011-07-20 MED ORDER — ACETAMINOPHEN 500 MG PO TABS
1000.0000 mg | ORAL_TABLET | Freq: Once | ORAL | Status: AC
  Administered 2011-07-20: 1000 mg via ORAL
  Filled 2011-07-20: qty 2

## 2011-07-20 MED ORDER — IBUPROFEN 200 MG PO TABS
400.0000 mg | ORAL_TABLET | Freq: Once | ORAL | Status: AC
  Administered 2011-07-20: 400 mg via ORAL
  Filled 2011-07-20: qty 2

## 2011-07-20 MED ORDER — ALBUTEROL SULFATE HFA 108 (90 BASE) MCG/ACT IN AERS
2.0000 | INHALATION_SPRAY | RESPIRATORY_TRACT | Status: AC
  Administered 2011-07-20: 2 via RESPIRATORY_TRACT

## 2011-07-20 MED ORDER — BENZONATATE 100 MG PO CAPS: 100.0000 mg | ORAL_CAPSULE | Freq: Three times a day (TID) | ORAL | Status: AC | PRN

## 2011-07-20 NOTE — ED Notes (Signed)
Pt states "started feeling bad yesterday, cough with yellow & green, chills, fever, aching all over"

## 2011-07-20 NOTE — ED Provider Notes (Signed)
History     CSN: [id number] Arrival date & time: 07/20/2011  7:12 AM   Chief Complaint  Patient presents with  . URI    HPI Pt was seen at 0740.   Per pt, c/o gradual onset and persistence of constant runny/stuffy nose, cough, subjective home fevers/chills, generlized body aches and fatigue x2 days.  Denies CP/SOB, no abd pain, no N/V/D, no rash, no back pain.   Past Medical History  Diagnosis Date  . Gestational diabetes   . Abnormal Pap smear     Past Surgical History  Procedure Date  . No past surgeries     Family History  Problem Relation Age of Onset  . Hypertension Father   . Diabetes Father   . Diabetes Mother   . Seizures Brother     History  Substance Use Topics  . Smoking status: Never Smoker   . Smokeless tobacco: Never Used  . Alcohol Use: No    OB History    Grav Para Term Preterm Abortions TAB SAB Ect Mult Living   2 2 1       2       Review of Systems ROS: Statement: All systems negative except as marked or noted in the HPI; Constitutional: +subjective home fevers/chills. +generalized body aches/fatigue; ; Eyes: Negative for eye pain, redness and discharge. ; ; ENMT: Negative for ear pain, hoarseness, +nasal congestion, sinus pressure and sore throat. ; ; Cardiovascular: Negative for chest pain, palpitations, diaphoresis, dyspnea and peripheral edema. ; ; Respiratory: +cough.  Negative for wheezing and stridor. ; ; Gastrointestinal: Negative for nausea, vomiting, diarrhea and abdominal pain, blood in stool, hematemesis, jaundice and rectal bleeding. . ; ; Genitourinary: Negative for dysuria, flank pain and hematuria. ; ; Musculoskeletal: Negative for back pain and neck pain. Negative for swelling and trauma.; ; Skin: Negative for pruritus, rash, abrasions, blisters, bruising and skin lesion.; ; Neuro: Negative for headache, lightheadedness and neck stiffness. Negative for weakness, altered level of consciousness , altered mental status, extremity weakness,  paresthesias, involuntary movement, seizure and syncope.     Allergies  Latex  Home Medications   Current Outpatient Rx  Name Route Sig Dispense Refill  . PE-DM-APAP & DOXYLAMIN-DM-APAP (LIQUID) PO MISC Oral Take 30 mLs by mouth once. For cold symptoms     . METFORMIN HCL 500 MG PO TABS Oral Take 1 tablet (500 mg total) by mouth 2 (two) times daily with a meal. 60 tablet 2  . NORETHINDRONE 0.35 MG PO TABS Oral Take 1 tablet (0.35 mg total) by mouth daily. 28 tablet 3  . PRENATAL PLUS 27-1 MG PO TABS Oral Take 1 tablet by mouth daily.      Bernadette Hoit SODIUM 8.6-50 MG PO TABS Oral Take 1-2 tablets by mouth at bedtime. 60 tablet 0    BP 111/74  Pulse 95  Temp(Src) 99.9 F (37.7 C) (Oral)  Resp 18  SpO2 100%  Physical Exam 0745: Physical examination:  Nursing notes reviewed; Vital signs and O2 SAT reviewed;  Constitutional: Well developed, Well nourished, Well hydrated, In no acute distress; Head:  Normocephalic, atraumatic; Eyes: EOMI, PERRL, No scleral icterus; ENMT: TM's clear bilat.  +edemetous nasal turbinates bilat with clear rhinorrhea. Mouth and pharynx normal, Mucous membranes moist; Neck: Supple, Full range of motion, No lymphadenopathy; Cardiovascular: Regular rate and rhythm, No murmur, rub, or gallop; Respiratory: Breath sounds clear & equal bilaterally, No rales, rhonchi, wheezes, Speaking full sentences with ease.  Normal respiratory effort/excursion; Chest: Nontender, Movement  normal; Abdomen: Soft, Nontender, Nondistended, Normal bowel sounds; Extremities: Pulses normal, No tenderness, No edema, No calf edema or asymmetry.; Neuro: AA&Ox3, Major CN grossly intact.  No gross focal motor or sensory deficits in extremities. Gait steady, normal coordination; Skin: Color normal, Warm, Dry, no rash, no petechiae.   ED Course  Procedures  MDM  MDM Reviewed: nursing note and vitals Interpretation: labs and x-ray   Results for orders placed during the hospital  encounter of 07/20/11  URINALYSIS, ROUTINE W REFLEX MICROSCOPIC      Component Value Range   Color, Urine YELLOW  YELLOW    APPearance CLOUDY (*) CLEAR    Specific Gravity, Urine 1.021  1.005 - 1.030    pH 6.5  5.0 - 8.0    Glucose, UA NEGATIVE  NEGATIVE (mg/dL)   Hgb urine dipstick SMALL (*) NEGATIVE    Bilirubin Urine NEGATIVE  NEGATIVE    Ketones, ur NEGATIVE  NEGATIVE (mg/dL)   Protein, ur NEGATIVE  NEGATIVE (mg/dL)   Urobilinogen, UA 0.2  0.0 - 1.0 (mg/dL)   Nitrite NEGATIVE  NEGATIVE    Leukocytes, UA NEGATIVE  NEGATIVE   RAPID STREP SCREEN      Component Value Range   Streptococcus, Group A Screen (Direct) NEGATIVE  NEGATIVE   POCT PREGNANCY, URINE      Component Value Range   Preg Test, Ur NEGATIVE    URINE MICROSCOPIC-ADD ON      Component Value Range   Squamous Epithelial / LPF FEW (*) RARE    WBC, UA 0-2  <3 (WBC/hpf)   RBC / HPF 0-2  <3 (RBC/hpf)   Bacteria, UA MANY (*) RARE    Dg Chest 2 View  07/20/2011  *RADIOLOGY REPORT*  Clinical Data: Cough, chest pain for 1 day  CHEST - 2 VIEW  Comparison: Chest x-ray of 09/19/2007  Findings: The lungs are not optimally aerated.  No infiltrate or effusion is seen. There is minimal peribronchial thickening present.  The heart is within normal limits in size.  No bony abnormality is seen.  IMPRESSION: No active lung disease.  Minimal peribronchial thickening.  Original Report Authenticated By: Juline Patch, M.D.     9:31 AM:  Appears viral illness/URI at this time.  Will tx symptomatically for now.  Dx testing d/w pt.  Questions answered.  Verb understanding, agreeable to d/c home with outpt f/u.        Cashton Hosley Allison Quarry, DO 07/20/11 2038

## 2011-07-20 NOTE — ED Notes (Signed)
ZOX:[id number]<AV> Expected date:07/20/11<BR> Expected time: 5:45 AM<BR> Means of arrival:Ambulance<BR> Comments:<BR> Fall, head and rib pain

## 2011-08-21 NOTE — L&D Delivery Note (Signed)
Delivery Note Called to room by RN and notified that pt delivered precipitously after AROM at 5 cm 15 minutes prior.  At 5:01 AM a viable and healthy female was delivered via Vaginal, Spontaneous Delivery (Presentation: Middle Occiput Anterior).  At arrival in room infant crying vigorously.  APGAR:pending , ; weight pending.   Placenta status: Intact, Spontaneous.  Cord:  with the following complications: .  Cord pH: n/a  Anesthesia: None  Episiotomy: None Lacerations: None Suture Repair: n/a Est. Blood Loss (mL): 50  Mom to postpartum.  Baby to nursery-stable.  [facility] 06/26/2012, 5:38 AM

## 2011-08-28 ENCOUNTER — Emergency Department (HOSPITAL_COMMUNITY)
Admission: EM | Admit: 2011-08-28 | Discharge: 2011-08-29 | Disposition: A | Discharge status: Home / Self Care | Payer: Medicaid Other | Attending: Emergency Medicine | Admitting: Emergency Medicine

## 2011-08-28 ENCOUNTER — Encounter (HOSPITAL_COMMUNITY): Payer: Self-pay | Admitting: Emergency Medicine

## 2011-08-28 DIAGNOSIS — L03119 Cellulitis of unspecified part of limb: Secondary | ICD-10-CM

## 2011-08-28 DIAGNOSIS — K0889 Other specified disorders of teeth and supporting structures: Secondary | ICD-10-CM

## 2011-08-28 DIAGNOSIS — K089 Disorder of teeth and supporting structures, unspecified: Secondary | ICD-10-CM | POA: Insufficient documentation

## 2011-08-28 DIAGNOSIS — L02519 Cutaneous abscess of unspecified hand: Secondary | ICD-10-CM | POA: Insufficient documentation

## 2011-08-29 NOTE — ED Provider Notes (Signed)
History     CSN: [id number]  Arrival date & time 08/28/11  2247   First MD Initiated Contact with Patient 08/29/11 0009      Chief Complaint  Patient presents with  . Hand Pain    swelling since this am.  thinks she was bit by something.  . Dental Pain    HPI Patient reports right hand swelling, itching and pain that began this morning. She reports that she woke up this morning and noticed a "bump" on her right 4th finger. She thought it was a bite of some kind and began to scratch it because it was itching. Since this morning, it has increased in size and redness. Her right hand is now swollen, red, tender, and she has difficulty forming a fist. She has not tried any medications for relief.  Patient also complains of dental pain that began several weeks ago. She reports associated difficulty eating.  She has not tried anything for relief. She does have a dentist and has tried to get an appt.   Past Medical History  Diagnosis Date  . Gestational diabetes   . Abnormal Pap smear     Past Surgical History  Procedure Date  . No past surgeries     Family History  Problem Relation Age of Onset  . Hypertension Father   . Diabetes Father   . Diabetes Mother   . Seizures Brother     History  Substance Use Topics  . Smoking status: Never Smoker   . Smokeless tobacco: Never Used  . Alcohol Use: No    OB History    Grav Para Term Preterm Abortions TAB SAB Ect Mult Living   2 2 1       2       Review of Systems  Allergies  Latex  Home Medications   Current Outpatient Rx  Name Route Sig Dispense Refill  . METFORMIN HCL 500 MG PO TABS Oral Take 1 tablet (500 mg total) by mouth 2 (two) times daily with a meal. 60 tablet 2  . NORETHINDRONE 0.35 MG PO TABS Oral Take 1 tablet (0.35 mg total) by mouth daily. 28 tablet 3  . PE-DM-APAP & DOXYLAMIN-DM-APAP (LIQUID) PO MISC Oral Take 30 mLs by mouth once. For cold symptoms     . PRENATAL PLUS 27-1 MG PO TABS Oral Take 1 tablet by  mouth daily.      Bernadette Hoit SODIUM 8.6-50 MG PO TABS Oral Take 1-2 tablets by mouth at bedtime. 60 tablet 0    BP 104/69  Pulse 66  Temp(Src) 98.5 F (36.9 C) (Oral)  Resp 16  Wt 270 lb (122.471 kg)  SpO2 100%  Physical Exam  Constitutional: She appears well-developed and well-nourished. No distress.  HENT:  Mouth/Throat: Oropharynx is clear and moist and mucous membranes are normal. Normal dentition. No dental abscesses or dental caries.  Musculoskeletal: She exhibits edema and tenderness.       Right hand: She exhibits decreased range of motion, tenderness and swelling. normal sensation noted. Normal strength noted.       Hands: Skin: Skin is warm and dry.    ED Course  Procedures (including critical care time)         The patient return here in the morning for reevaluation of her hand.  Told to return here for that time for any worsening in her condition.  She is advised to use warm compresses on the area MDM  Cellulitis based on history and  physical exam findings.     The patient's full discharge instructions and prescriptions were done by hand as there was a down time in this computer system.  That is the delay for the documentation portion of this as well   Carlyle Dolly, PA-C 08/31/11 1143  Carlyle Dolly, PA-C 08/31/11 1145

## 2011-09-02 NOTE — ED Provider Notes (Signed)
Medical screening examination/treatment/procedure(s) were performed by non-physician practitioner and as supervising physician I was immediately available for consultation/collaboration.   Danyela Posas D Villarin, MD 09/02/11 0552 

## 2011-11-30 ENCOUNTER — Other Ambulatory Visit: Payer: Self-pay | Admitting: Advanced Practice Midwife

## 2011-11-30 ENCOUNTER — Encounter (HOSPITAL_COMMUNITY): Payer: Self-pay | Admitting: *Deleted

## 2011-11-30 ENCOUNTER — Inpatient Hospital Stay (HOSPITAL_COMMUNITY)
Admission: AD | Admit: 2011-11-30 | Discharge: 2011-11-30 | Disposition: A | Discharge status: Home / Self Care | Payer: Medicaid Other | Source: Ambulatory Visit | Attending: Obstetrics & Gynecology | Admitting: Obstetrics & Gynecology

## 2011-11-30 DIAGNOSIS — N760 Acute vaginitis: Secondary | ICD-10-CM | POA: Insufficient documentation

## 2011-11-30 DIAGNOSIS — M545 Low back pain, unspecified: Secondary | ICD-10-CM | POA: Insufficient documentation

## 2011-11-30 DIAGNOSIS — A499 Bacterial infection, unspecified: Secondary | ICD-10-CM | POA: Insufficient documentation

## 2011-11-30 DIAGNOSIS — J301 Allergic rhinitis due to pollen: Secondary | ICD-10-CM | POA: Insufficient documentation

## 2011-11-30 DIAGNOSIS — R51 Headache: Secondary | ICD-10-CM | POA: Insufficient documentation

## 2011-11-30 DIAGNOSIS — R109 Unspecified abdominal pain: Secondary | ICD-10-CM | POA: Insufficient documentation

## 2011-11-30 DIAGNOSIS — B9689 Other specified bacterial agents as the cause of diseases classified elsewhere: Secondary | ICD-10-CM | POA: Insufficient documentation

## 2011-11-30 DIAGNOSIS — O2390 Unspecified genitourinary tract infection in pregnancy, unspecified trimester: Secondary | ICD-10-CM | POA: Insufficient documentation

## 2011-11-30 DIAGNOSIS — N390 Urinary tract infection, site not specified: Secondary | ICD-10-CM | POA: Insufficient documentation

## 2011-11-30 DIAGNOSIS — O2340 Unspecified infection of urinary tract in pregnancy, unspecified trimester: Secondary | ICD-10-CM

## 2011-11-30 LAB — URINALYSIS, ROUTINE W REFLEX MICROSCOPIC
Glucose, UA: NEGATIVE mg/dL
Ketones, ur: 15 mg/dL — AB
Nitrite: NEGATIVE
Specific Gravity, Urine: >1.03 — ABNORMAL HIGH (ref 1.005–1.030)
pH: 6 (ref 5.0–8.0)

## 2011-11-30 LAB — URINE MICROSCOPIC-ADD ON

## 2011-11-30 LAB — WET PREP, GENITAL
Clue Cells Wet Prep HPF POC: NONE SEEN
Trich, Wet Prep: NONE SEEN
Yeast Wet Prep HPF POC: NONE SEEN

## 2011-11-30 MED ORDER — PRENATAL VITAMINS (DIS) PO TABS: 1.0000 | ORAL_TABLET | Freq: Every day | ORAL | Status: DC

## 2011-11-30 MED ORDER — CEPHALEXIN 500 MG PO CAPS: 500.0000 mg | ORAL_CAPSULE | Freq: Two times a day (BID) | ORAL | Status: AC

## 2011-11-30 MED ORDER — METRONIDAZOLE 500 MG PO TABS: 500.0000 mg | ORAL_TABLET | Freq: Two times a day (BID) | ORAL | Status: AC

## 2011-11-30 MED ORDER — ACETAMINOPHEN 500 MG PO TABS
1000.0000 mg | ORAL_TABLET | Freq: Once | ORAL | Status: AC
  Administered 2011-11-30: 1000 mg via ORAL
  Filled 2011-11-30: qty 2

## 2011-11-30 NOTE — MAU Note (Signed)
Onset of back pain and stomach pain x 2 to 3 weeks, headaches  For 3 or 4 days, LMP 08/14/11

## 2011-11-30 NOTE — MAU Provider Note (Signed)
History     CSN: [id number]  Arrival date and time: 11/30/11 1319   First Provider Initiated Contact with Patient 11/30/11 1522      Chief Complaint  Patient presents with  . Back Pain  . Headache  . Abdominal Pain   HPI 26 y.o. G3P1002 at [redacted]w[redacted]d c/o low back and abd pain x 2 weeks, no dysuria, fever, chills, n/v, + vaginal odor, no bleeding. Also c/o headache x 2 days - has not taken anything for headache. Also c/o seasonal allergies - not taking anything.     Past Medical History  Diagnosis Date  . Gestational diabetes   . Abnormal Pap smear     Past Surgical History  Procedure Date  . No past surgeries     Family History  Problem Relation Age of Onset  . Hypertension Father   . Diabetes Father   . Diabetes Mother   . Seizures Brother     History  Substance Use Topics  . Smoking status: Never Smoker   . Smokeless tobacco: Never Used  . Alcohol Use: No    Allergies:  Allergies  Allergen Reactions  . Latex Itching and Rash    No prescriptions prior to admission    Review of Systems  Constitutional: Negative.   HENT:       + sneezing   Respiratory: Negative.   Cardiovascular: Negative.   Gastrointestinal: Positive for abdominal pain. Negative for nausea, vomiting, diarrhea and constipation.  Genitourinary: Negative for dysuria, urgency, frequency, hematuria and flank pain.       Negative for vaginal bleeding, + discharge   Musculoskeletal: Positive for back pain.  Neurological: Positive for headaches.  Psychiatric/Behavioral: Negative.    Physical Exam   Blood pressure 110/67, pulse 76, temperature 98.5 F (36.9 C), temperature source Oral, resp. rate 16, height 5\' 6"  (1.676 m), weight 280 lb 6.4 oz (127.189 kg), last menstrual period 08/14/2011.  Physical Exam  Constitutional: She is oriented to person, place, and time. She appears well-developed and well-nourished. No distress.  HENT:  Head: Normocephalic and atraumatic.  Cardiovascular:  Normal rate, regular rhythm and normal heart sounds.   Respiratory: Effort normal and breath sounds normal. No respiratory distress.  GI: Soft. Bowel sounds are normal. She exhibits no distension and no mass. There is no tenderness. There is no rebound and no guarding.  Genitourinary: There is no rash or lesion on the right labia. There is no rash or lesion on the left labia. Uterus is not deviated, not enlarged, not fixed and not tender. Cervix exhibits no motion tenderness, no discharge and no friability. Right adnexum displays no mass, no tenderness and no fullness. Left adnexum displays no mass, no tenderness and no fullness. No erythema, tenderness or bleeding around the vagina. Vaginal discharge (white, extremely malodorous) found.  Neurological: She is alert and oriented to person, place, and time.  Skin: Skin is warm and dry.  Psychiatric: She has a normal mood and affect.    MAU Course  Procedures Results for orders placed during the hospital encounter of 11/30/11 (from the past 24 hour(s))  URINALYSIS, ROUTINE W REFLEX MICROSCOPIC     Status: Abnormal   Collection Time   11/30/11  1:40 PM      Component Value Range   Color, Urine YELLOW  YELLOW    APPearance CLOUDY (*) CLEAR    Specific Gravity, Urine >1.030 (*) 1.005 - 1.030    pH 6.0  5.0 - 8.0    Glucose, UA  NEGATIVE  NEGATIVE (mg/dL)   Hgb urine dipstick TRACE (*) NEGATIVE    Bilirubin Urine NEGATIVE  NEGATIVE    Ketones, ur 15 (*) NEGATIVE (mg/dL)   Protein, ur 30 (*) NEGATIVE (mg/dL)   Urobilinogen, UA 0.2  0.0 - 1.0 (mg/dL)   Nitrite NEGATIVE  NEGATIVE    Leukocytes, UA MODERATE (*) NEGATIVE   URINE MICROSCOPIC-ADD ON     Status: Abnormal   Collection Time   11/30/11  1:40 PM      Component Value Range   Squamous Epithelial / LPF MANY (*) RARE    WBC, UA 21-50  <3 (WBC/hpf)   RBC / HPF 3-6  <3 (RBC/hpf)   Bacteria, UA MANY (*) RARE   POCT PREGNANCY, URINE     Status: Abnormal   Collection Time   11/30/11  2:19 PM        Component Value Range   Preg Test, Ur POSITIVE (*) NEGATIVE   WET PREP, GENITAL     Status: Abnormal   Collection Time   11/30/11  3:15 PM      Component Value Range   Yeast Wet Prep HPF POC NONE SEEN  NONE SEEN    Trich, Wet Prep NONE SEEN  NONE SEEN    Clue Cells Wet Prep HPF POC NONE SEEN  NONE SEEN    WBC, Wet Prep HPF POC FEW (*) NONE SEEN    + FHR on bedside u/s  Assessment and Plan  26 y.o. G3P1002 at [redacted]w[redacted]d UTI - culture sent, rx Keflex BV - wet prep negative, but will prescribe flagyl based on exam F/U for prenatal care ASAP - pregnancy verification given Headache - tylenol PRN Seasonal allergies - claritin OTC   Neeko Pharo 11/30/2011, 3:50 PM

## 2011-12-01 LAB — GC/CHLAMYDIA PROBE AMP, GENITAL: GC Probe Amp, Genital: NEGATIVE

## 2011-12-02 LAB — URINE CULTURE

## 2011-12-03 ENCOUNTER — Telehealth: Payer: Self-pay | Admitting: *Deleted

## 2011-12-03 NOTE — Telephone Encounter (Signed)
Pharmacist called and stated needed call back about brand of PNV.

## 2011-12-04 NOTE — Telephone Encounter (Signed)
Request routed to Georges Mouse CNM- prescriber of PNV.

## 2011-12-04 NOTE — MAU Provider Note (Signed)
Attestation of Attending Supervision of Advanced Practitioner: Evaluation and management procedures were performed by the OB Fellow/PA/CNM/NP under my supervision and collaboration. Chart reviewed, and agree with management and plan.  Esgar Barnick, M.D. 12/04/2011 11:34 AM   

## 2011-12-05 ENCOUNTER — Ambulatory Visit: Payer: Medicaid Other | Admitting: Family Medicine

## 2011-12-17 ENCOUNTER — Other Ambulatory Visit: Payer: Self-pay

## 2011-12-17 LAB — OB RESULTS CONSOLE RPR: RPR: NONREACTIVE

## 2011-12-17 LAB — OB RESULTS CONSOLE HIV ANTIBODY (ROUTINE TESTING): HIV: NONREACTIVE

## 2011-12-17 LAB — OB RESULTS CONSOLE PLATELET COUNT: Platelets: 164 10*3/uL

## 2012-01-21 ENCOUNTER — Encounter (HOSPITAL_COMMUNITY): Payer: Self-pay | Admitting: Emergency Medicine

## 2012-01-21 ENCOUNTER — Emergency Department (INDEPENDENT_AMBULATORY_CARE_PROVIDER_SITE_OTHER)
Admission: EM | Admit: 2012-01-21 | Discharge: 2012-01-21 | Disposition: A | Discharge status: Home / Self Care | Payer: Medicaid Other | Source: Home / Self Care | Attending: Family Medicine | Admitting: Family Medicine

## 2012-01-21 DIAGNOSIS — Z3490 Encounter for supervision of normal pregnancy, unspecified, unspecified trimester: Secondary | ICD-10-CM

## 2012-01-21 DIAGNOSIS — N898 Other specified noninflammatory disorders of vagina: Secondary | ICD-10-CM

## 2012-01-21 DIAGNOSIS — R197 Diarrhea, unspecified: Secondary | ICD-10-CM

## 2012-01-21 DIAGNOSIS — Z331 Pregnant state, incidental: Secondary | ICD-10-CM

## 2012-01-21 LAB — WET PREP, GENITAL: Yeast Wet Prep HPF POC: NONE SEEN

## 2012-01-21 LAB — POCT URINALYSIS DIP (DEVICE)
Glucose, UA: NEGATIVE mg/dL
Nitrite: NEGATIVE
Protein, ur: NEGATIVE mg/dL
Specific Gravity, Urine: >=1.03 (ref 1.005–1.030)
Urobilinogen, UA: 0.2 mg/dL (ref 0.0–1.0)
pH: 6 (ref 5.0–8.0)

## 2012-01-21 NOTE — ED Notes (Signed)
Pt c/o stomach pain, bad headache, nausea for 2 days. Pt says she is [redacted] weeks pregnant and is having some bleeding, which happened in her first pregnancy also when exposed to an STD. Pt states she has frequency and dysuria, pain, odor and discharge.

## 2012-01-21 NOTE — Discharge Instructions (Signed)
Keep well-hydrated.  We'll call you if any of the pending result is abnormal, we might ask you to return for treatment if necessary. Followup with your OB specialist as scheduled.  [facility] symptoms like pelvic pain, vaginal bleeding or any other concern.

## 2012-01-22 LAB — GC/CHLAMYDIA PROBE AMP, GENITAL
Chlamydia, DNA Probe: NEGATIVE
GC Probe Amp, Genital: NEGATIVE

## 2012-01-23 ENCOUNTER — Telehealth (HOSPITAL_COMMUNITY): Payer: Self-pay | Admitting: *Deleted

## 2012-01-23 NOTE — ED Provider Notes (Signed)
History     CSN: [id number]  Arrival date & time 01/21/12  1150   First MD Initiated Contact with Patient 01/21/12 1224      Chief Complaint  Patient presents with  . Exposure to STD    (Consider location/radiation/quality/duration/timing/severity/associated sxs/prior treatment) HPI Comments: 26 y/o female states she is [redacted] weeks pregnant. Comes c/o vaginal discharge and bad odor for 1 week. Denies pelvic pain or cramps. Reports had pink discharge earlier during the week that is now resolved. Patient denied to me symptoms like burning on urination. Wants to be checked for STD as her "baby daddy has been messing around and has an STD" Patient [facility] also was treated for BV.  Also reports diarrhea in last 2 days no blood. Had had 2 bowel movements today drinking fluids and tolerating solids with no nausea or vomiting. No fever or chills.    Past Medical History  Diagnosis Date  . Gestational diabetes   . Abnormal Pap smear     Past Surgical History  Procedure Date  . No past surgeries     Family History  Problem Relation Age of Onset  . Hypertension Father   . Diabetes Father   . Diabetes Mother   . Seizures Brother     History  Substance Use Topics  . Smoking status: Never Smoker   . Smokeless tobacco: Never Used  . Alcohol Use: No    OB History    Grav Para Term Preterm Abortions TAB SAB Ect Mult Living   3 2 1       2       Review of Systems  Constitutional: Negative for fever, chills and appetite change.  Gastrointestinal: Positive for diarrhea. Negative for nausea, vomiting, abdominal pain and blood in stool.  Genitourinary: Positive for vaginal discharge. Negative for dysuria, flank pain, vaginal bleeding, pelvic pain and dyspareunia.  Musculoskeletal: Negative for back pain.  Skin: Negative for rash.  Neurological: Negative for dizziness and headaches.     Allergies  Latex  Home Medications   Current Outpatient Rx  Name Route Sig Dispense Refill  . PRENATAL VITAMINS (DIS) PO TABS Oral Take 1 tablet by mouth daily. 30 tablet 11    BP 108/71  Pulse 80  Temp(Src) 98 F (36.7 C) (Oral)  Resp 16  SpO2 100%  LMP 08/14/2011  Physical Exam  Nursing note and vitals reviewed. Constitutional: She is oriented to person, place, and time. She appears well-developed and well-nourished. No distress.       Obese. appears comfortable text ing on her phone during most part of the encounter.  HENT:  Mouth/Throat: Oropharynx is clear and moist.  Eyes: No scleral icterus.  Cardiovascular: Normal heart sounds.   Pulmonary/Chest: Breath sounds normal.  Abdominal: Soft. Bowel sounds are normal. She exhibits no distension and no mass. There is no tenderness. There is no rebound and no guarding.       Obese abdomen. Otherwise normal exam. No CVT.  Genitourinary:       White thin aginal discharge.  No vaginal bleeding Cervix close. No tenderness to cervical motion. Uterus enlarged. No tender.   Lymphadenopathy:    She has no cervical adenopathy.  Neurological: She is alert and oriented to person, place, and time.  Skin: No rash noted.    ED Course  Procedures (including critical care time)  Labs Reviewed  POCT URINALYSIS DIP (DEVICE) - Abnormal;  Notable for the following:    Hgb urine dipstick TRACE (*)    All other components within normal limits  POCT PREGNANCY, URINE - Abnormal; Notable for the following:    Preg Test, Ur POSITIVE (*)    All other components within normal limits  WET PREP, GENITAL - Abnormal; Notable for the following:    WBC, Wet Prep HPF POC MODERATE (*)    All other components within normal limits  GC/CHLAMYDIA PROBE AMP, GENITAL  LAB REPORT - SCANNED   No results found.   1. Vaginal Discharge   2. Pregnancy   3. Diarrhea       MDM  Early pregnancy with vaginal discharge. Concerned about STD exposure:  no clue cells, trich or yeast. negative GC/Chl. Normal UA. No oral treatment. Encouraged condom use or abstinence if worried about partner promiscuity. Asked to follow up with Dr. Gaynell Face to monitor he symptoms. Mild diarrhea with no signs of dehydration: encouraged hydration.         Sharin Grave, MD 01/23/12 [phone]

## 2012-01-23 NOTE — ED Notes (Signed)
Pt. called for her lab results.  Pt. verified x 2 and given results (GC/Chlamydia neg., Wet prep: mod. clue cells). Vassie Moselle 01/23/2012

## 2012-01-26 ENCOUNTER — Encounter (HOSPITAL_COMMUNITY): Payer: Self-pay | Admitting: Obstetrics and Gynecology

## 2012-01-26 ENCOUNTER — Inpatient Hospital Stay (HOSPITAL_COMMUNITY)
Admission: AD | Admit: 2012-01-26 | Discharge: 2012-01-26 | Disposition: A | Discharge status: Home / Self Care | Payer: Medicaid Other | Source: Ambulatory Visit | Attending: Obstetrics | Admitting: Obstetrics

## 2012-01-26 DIAGNOSIS — G44209 Tension-type headache, unspecified, not intractable: Secondary | ICD-10-CM | POA: Insufficient documentation

## 2012-01-26 DIAGNOSIS — O99891 Other specified diseases and conditions complicating pregnancy: Secondary | ICD-10-CM | POA: Insufficient documentation

## 2012-01-26 DIAGNOSIS — R51 Headache: Secondary | ICD-10-CM

## 2012-01-26 DIAGNOSIS — O368190 Decreased fetal movements, unspecified trimester, not applicable or unspecified: Secondary | ICD-10-CM | POA: Insufficient documentation

## 2012-01-26 MED ORDER — BUTALBITAL-APAP-CAFFEINE 50-325-40 MG PO TABS
2.0000 | ORAL_TABLET | Freq: Once | ORAL | Status: AC
  Administered 2012-01-26: 2 via ORAL
  Filled 2012-01-26: qty 2

## 2012-01-26 MED ORDER — BUTALBITAL-APAP-CAFFEINE 50-325-40 MG PO TABS: 1.0000 | ORAL_TABLET | Freq: Four times a day (QID) | ORAL | Status: DC | PRN

## 2012-01-26 NOTE — MAU Provider Note (Signed)
  History   Pt presents today c/o HA. She states she has HAs that come and go and is somewhat relieved by Tylenol. She has not taken anything today. She also reports decreased fetal movement. She denies vag dc, bleeding, abd pain, or any other sx at this time.  CSN: [id number]  Arrival date and time: 01/26/12 0746   None     No chief complaint on file.  HPI  OB History    Grav Para Term Preterm Abortions TAB SAB Ect Mult Living   3 2 1       2       Past Medical History  Diagnosis Date  . Gestational diabetes   . Abnormal Pap smear     Past Surgical History  Procedure Date  . No past surgeries     Family History  Problem Relation Age of Onset  . Hypertension Father   . Diabetes Father   . Diabetes Mother   . Seizures Brother     History  Substance Use Topics  . Smoking status: Never Smoker   . Smokeless tobacco: Never Used  . Alcohol Use: No    Allergies:  Allergies  Allergen Reactions  . Latex Itching and Rash    Prescriptions prior to admission  Medication Sig Dispense Refill  . Prenatal Vitamins (DIS) TABS Take 1 tablet by mouth daily.  30 tablet  11    Review of Systems  Constitutional: Negative for fever and chills.  Eyes: Negative for blurred vision, double vision and photophobia.  Respiratory: Negative for cough, hemoptysis, sputum production, shortness of breath and wheezing.   Cardiovascular: Negative for chest pain and palpitations.  Gastrointestinal: Negative for nausea, vomiting, abdominal pain, diarrhea and constipation.  Genitourinary: Negative for dysuria, urgency, frequency, hematuria and flank pain.  Neurological: Positive for headaches. Negative for dizziness.  Psychiatric/Behavioral: Negative for depression and suicidal ideas.   Physical Exam   Last menstrual period 08/14/2011.  Physical Exam  Nursing note and vitals reviewed. Constitutional: She is oriented to person, place, and time. She appears well-developed and  well-nourished. No distress.  HENT:  Head: Normocephalic and atraumatic.  Eyes: EOM are normal. Pupils are equal, round, and reactive to light.  Cardiovascular: Normal rate and regular rhythm.  Exam reveals no gallop and no friction rub.   No murmur heard. Respiratory: Effort normal and breath sounds normal. No respiratory distress. She has no wheezes. She has no rales. She exhibits no tenderness.  GI: Soft. She exhibits no distension and no mass. There is no tenderness. There is no rebound and no guarding.  Neurological: She is alert and oriented to person, place, and time.  Skin: Skin is warm and dry. She is not diaphoretic.  Psychiatric: She has a normal mood and affect. Her behavior is normal. Judgment and thought content normal.    MAU Course  Procedures Bedside US done to confirm FHT. US shows single IUP with good cardiac activity in the 150s.   Pt reports her HA has now completely resolved following po fioricet.  Assessment and Plan  Tension HA: discussed with pt at length. Will give Rx for fioricet. She has a f/u appt scheduled with Dr. Gaynell Face. Discussed diet, activity, risks, and precautions.  Clinton Gallant. Shye Doty III, DrHSc, MPAS, PA-C  01/26/2012, 8:08 AM

## 2012-01-26 NOTE — Discharge Instructions (Signed)
Headache Headaches are caused by many different problems. Most commonly, headache is caused by muscle tension from an injury, fatigue, or emotional upset. Excessive muscle contractions in the scalp and neck result in a headache that often feels like a tight band around the head. Tension headaches often have areas of tenderness over the scalp and the back of the neck. These headaches may last for hours, days, or longer, and some may contribute to migraines in those who have migraine problems. Migraines usually cause a throbbing headache, which is made worse by activity. Sometimes only one side of the head hurts. Nausea, vomiting, eye pain, and avoidance of food are common with migraines. Visual symptoms such as light sensitivity, blind spots, or flashing lights may also occur. Loud noises may worsen migraine headaches. Many factors may cause migraine headaches:  Emotional stress, lack of sleep, and menstrual periods.   Alcohol and some drugs (such as birth control pills).   Diet factors (fasting, caffeine, food preservatives, chocolate).   Environmental factors (weather changes, bright lights, odors, smoke).  Other causes of headaches include minor injuries to the head. Arthritis in the neck; problems with the jaw, eyes, ears, or nose are also causes of headaches. Allergies, drugs, alcohol, and exposure to smoke can also cause moderate headaches. Rebound headaches can occur if someone uses pain medications for a long period of time and then stops. Less commonly, blood vessel problems in the neck and brain (including stroke) can cause various types of headache. Treatment of headaches includes medicines for pain and relaxation. Ice packs or heat applied to the back of the head and neck help some people. Massaging the shoulders, neck and scalp are often very useful. Relaxation techniques and stretching can help prevent these headaches. Avoid alcohol and cigarette smoking as these tend to make headaches  worse. Please see your caregiver if your headache is not better in 2 days.  SEEK IMMEDIATE MEDICAL CARE IF:   You develop a high fever, chills, or repeated vomiting.   You faint or have difficulty with vision.   You develop unusual numbness or weakness of your arms or legs.   Relief of pain is inadequate with medication, or you develop severe pain.   You develop confusion, or neck stiffness.   You have a worsening of a headache or do not obtain relief.  Document Released: 08/06/2005 Document Revised: 07/26/2011 Document Reviewed: 01/30/2007 [employer] Patient Information 2012 [employer]. 

## 2012-02-13 ENCOUNTER — Encounter (HOSPITAL_COMMUNITY): Payer: Self-pay | Admitting: Obstetrics and Gynecology

## 2012-02-13 ENCOUNTER — Inpatient Hospital Stay (HOSPITAL_COMMUNITY)
Admission: AD | Admit: 2012-02-13 | Discharge: 2012-02-13 | Disposition: A | Discharge status: Home / Self Care | Payer: Medicaid Other | Source: Ambulatory Visit | Attending: Obstetrics | Admitting: Obstetrics

## 2012-02-13 DIAGNOSIS — O211 Hyperemesis gravidarum with metabolic disturbance: Secondary | ICD-10-CM | POA: Insufficient documentation

## 2012-02-13 DIAGNOSIS — E860 Dehydration: Secondary | ICD-10-CM | POA: Insufficient documentation

## 2012-02-13 DIAGNOSIS — R51 Headache: Secondary | ICD-10-CM | POA: Insufficient documentation

## 2012-02-13 DIAGNOSIS — R109 Unspecified abdominal pain: Secondary | ICD-10-CM | POA: Insufficient documentation

## 2012-02-13 DIAGNOSIS — R112 Nausea with vomiting, unspecified: Secondary | ICD-10-CM

## 2012-02-13 LAB — URINALYSIS, ROUTINE W REFLEX MICROSCOPIC
Hgb urine dipstick: NEGATIVE
Nitrite: NEGATIVE
Protein, ur: NEGATIVE mg/dL
Specific Gravity, Urine: >1.03 — ABNORMAL HIGH (ref 1.005–1.030)
Urobilinogen, UA: 1 mg/dL (ref 0.0–1.0)

## 2012-02-13 LAB — URINE MICROSCOPIC-ADD ON

## 2012-02-13 MED ORDER — ONDANSETRON 4 MG PO TBDP
4.0000 mg | ORAL_TABLET | Freq: Once | ORAL | Status: AC
  Administered 2012-02-13: 4 mg via ORAL
  Filled 2012-02-13: qty 1

## 2012-02-13 MED ORDER — ONDANSETRON 8 MG PO TBDP: 8.0000 mg | ORAL_TABLET | Freq: Three times a day (TID) | ORAL | Status: AC | PRN

## 2012-02-13 MED ORDER — PROMETHAZINE HCL 25 MG/ML IJ SOLN
25.0000 mg | Freq: Once | INTRAVENOUS | Status: DC
  Filled 2012-02-13: qty 1

## 2012-02-13 MED ORDER — ONDANSETRON HCL 4 MG/2ML IJ SOLN: 4.0000 mg | Freq: Once | INTRAMUSCULAR | Status: DC

## 2012-02-13 NOTE — Discharge Instructions (Signed)
Keep your appointment tomorrow with Dr. Gaynell Face.  Return here as needed.  B.R.A.T. Diet Your doctor has recommended the B.R.A.T. diet for you or your child until the condition improves. This is often used to help control diarrhea and vomiting symptoms. If you or your child can tolerate clear liquids, you may have:  Bananas.   Rice.   Applesauce.   Toast (and other simple starches such as crackers, potatoes, noodles).  Be sure to avoid dairy products, meats, and fatty foods until symptoms are better. Fruit juices such as apple, grape, and prune juice can make diarrhea worse. Avoid these. Continue this diet for 2 days or as instructed by your caregiver. Document Released: 08/06/2005 Document Revised: 07/26/2011 Document Reviewed: 01/23/2007 [facility] Patient Information 2012 [place], [place].
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
## 2012-03-24 ENCOUNTER — Encounter (HOSPITAL_COMMUNITY): Payer: Self-pay | Admitting: *Deleted

## 2012-03-24 ENCOUNTER — Inpatient Hospital Stay (HOSPITAL_COMMUNITY)
Admission: AD | Admit: 2012-03-24 | Discharge: 2012-03-25 | Disposition: A | Discharge status: Home / Self Care | Payer: Medicaid Other | Source: Ambulatory Visit | Attending: Obstetrics | Admitting: Obstetrics

## 2012-03-24 DIAGNOSIS — B359 Dermatophytosis, unspecified: Secondary | ICD-10-CM

## 2012-03-24 DIAGNOSIS — M79669 Pain in unspecified lower leg: Secondary | ICD-10-CM

## 2012-03-24 DIAGNOSIS — R51 Headache: Secondary | ICD-10-CM | POA: Insufficient documentation

## 2012-03-24 DIAGNOSIS — R109 Unspecified abdominal pain: Secondary | ICD-10-CM | POA: Insufficient documentation

## 2012-03-24 DIAGNOSIS — M549 Dorsalgia, unspecified: Secondary | ICD-10-CM

## 2012-03-24 DIAGNOSIS — N61 Mastitis without abscess: Secondary | ICD-10-CM | POA: Insufficient documentation

## 2012-03-24 LAB — URINALYSIS, ROUTINE W REFLEX MICROSCOPIC
Glucose, UA: NEGATIVE mg/dL
Hgb urine dipstick: NEGATIVE
Ketones, ur: 15 mg/dL — AB
Leukocytes, UA: NEGATIVE
Protein, ur: NEGATIVE mg/dL
pH: 6 (ref 5.0–8.0)

## 2012-03-24 LAB — WET PREP, GENITAL: Clue Cells Wet Prep HPF POC: NONE SEEN

## 2012-03-24 MED ORDER — CYCLOBENZAPRINE HCL 10 MG PO TABS
10.0000 mg | ORAL_TABLET | Freq: Once | ORAL | Status: AC
  Administered 2012-03-25: 10 mg via ORAL
  Filled 2012-03-24: qty 1

## 2012-03-24 NOTE — MAU Note (Signed)
Started having pains in back and belly last night, then to work. Had a big cramp in the back of leg while at work.  Then head started hurting.  Has rash under breast

## 2012-03-24 NOTE — MAU Provider Note (Signed)
History     CSN: [id number]  Arrival date and time: 03/24/12 4098   First Provider Initiated Contact with Patient 03/24/12 2245      Chief Complaint  Patient presents with  . Abdominal Pain   HPI  Pains in mid lower back and pelvic region that started last night; pt went to work and began to have big cramp in left calf that lasted for approximately 5 min.  No calf pain at this time.  Pt also reports frontal headache that started while at work today.  +similar headaches in pregnancy before.  No vision changes, nausea or vomiting.  . In addition pt report rash under both breasts, +itching.  Pt denies vaginal bleeding or leaking of fluid.     Past Medical History  Diagnosis Date  . Abnormal Pap smear   . Gestational diabetes     2012    Past Surgical History  Procedure Date  . No past surgeries     Family History  Problem Relation Age of Onset  . Hypertension Father   . Diabetes Father   . Diabetes Mother   . Seizures Brother     History  Substance Use Topics  . Smoking status: Never Smoker   . Smokeless tobacco: Never Used  . Alcohol Use: No    Allergies:  Allergies  Allergen Reactions  . Latex Itching and Rash    Prescriptions prior to admission  Medication Sig Dispense Refill  . Prenatal Vit-Fe Fumarate-FA (PRENATAL MULTIVITAMIN) TABS Take 1 tablet by mouth daily.        Review of Systems  Constitutional: Negative.   Eyes: Negative.   Respiratory: Negative.   Cardiovascular: Negative.   Gastrointestinal: Positive for abdominal pain (lower pelvic pain).  Musculoskeletal: Positive for back pain.       Left calf pain  Skin: Positive for rash (breasts).  Neurological: Positive for headaches.   Physical Exam   Blood pressure 110/63, pulse 87, temperature 98.8 F (37.1 C), temperature source Oral, resp. rate 20, height 5\' 5"  (1.651 m), weight 280 lb (127.007 kg), last menstrual period 08/14/2011, SpO2 100.00%, unknown if currently  breastfeeding.  Physical Exam  Constitutional: She is oriented to person, place, and time. She appears well-developed and well-nourished. No distress.  HENT:  Head: Normocephalic.  Neck: Normal range of motion. Neck supple.  Cardiovascular: Normal rate, regular rhythm and normal heart sounds.   Respiratory: Effort normal and breath sounds normal.       Dry, red area under both breast bilat  GI: Soft. There is no tenderness.  Genitourinary: No bleeding around the vagina. Vaginal discharge (white, adherent) found.       Cervix - closed/thick  Musculoskeletal:       Left calf - 48 cm; right calf - 48.5 cm; no warmth or mass palpated  Neurological: She is alert and oriented to person, place, and time.  Skin: Skin is warm and dry.   FHR 140's, +accels Toco - none  MAU Course  Procedures  Flexeril 10 mg PO > pt reports relief  Consulted with Dr. Gaynell Face > DC home, obtain Doppler Studies in AM, F/U in office  Results for orders placed during the hospital encounter of 03/24/12 (from the past 24 hour(s))  URINALYSIS, ROUTINE W REFLEX MICROSCOPIC     Status: Abnormal   Collection Time   03/24/12  8:25 PM      Component Value Range   Color, Urine YELLOW  YELLOW   APPearance CLEAR  CLEAR  Specific Gravity, Urine >1.030 (*) 1.005 - 1.030   pH 6.0  5.0 - 8.0   Glucose, UA NEGATIVE  NEGATIVE mg/dL   Hgb urine dipstick NEGATIVE  NEGATIVE   Bilirubin Urine NEGATIVE  NEGATIVE   Ketones, ur 15 (*) NEGATIVE mg/dL   Protein, ur NEGATIVE  NEGATIVE mg/dL   Urobilinogen, UA 0.2  0.0 - 1.0 mg/dL   Nitrite NEGATIVE  NEGATIVE   Leukocytes, UA NEGATIVE  NEGATIVE  WET PREP, GENITAL     Status: Abnormal   Collection Time   03/24/12 11:00 PM      Component Value Range   Yeast Wet Prep HPF POC NONE SEEN  NONE SEEN   Trich, Wet Prep NONE SEEN  NONE SEEN   Clue Cells Wet Prep HPF POC NONE SEEN  NONE SEEN   WBC, Wet Prep HPF POC FEW (*) NONE SEEN     Assessment and Plan  Back Pain Calf  Pain Fungal Infection - Breast  Plan: DC to home RX Flexeril Venous Doppler Study in AM Lotrimin Cream - Apply BID  [facility] 03/24/2012, 10:46 PM

## 2012-03-24 NOTE — MAU Note (Signed)
Pt reports pain in lower back and lower abd today. Denies dysuria. Denies bleeding. Also reports a rash under both breasts.

## 2012-03-25 DIAGNOSIS — M549 Dorsalgia, unspecified: Secondary | ICD-10-CM

## 2012-03-25 DIAGNOSIS — O26899 Other specified pregnancy related conditions, unspecified trimester: Secondary | ICD-10-CM

## 2012-03-25 LAB — GC/CHLAMYDIA PROBE AMP, GENITAL: GC Probe Amp, Genital: NEGATIVE

## 2012-03-25 MED ORDER — CYCLOBENZAPRINE HCL 10 MG PO TABS: 10.0000 mg | ORAL_TABLET | Freq: Three times a day (TID) | ORAL | Status: AC | PRN

## 2012-03-25 MED ORDER — CLOTRIMAZOLE 1 % EX CREA: TOPICAL_CREAM | Freq: Two times a day (BID) | CUTANEOUS | Status: DC

## 2012-04-10 LAB — OB RESULTS CONSOLE RPR: RPR: NONREACTIVE

## 2012-04-15 ENCOUNTER — Encounter (HOSPITAL_COMMUNITY): Payer: Self-pay | Admitting: *Deleted

## 2012-04-15 ENCOUNTER — Inpatient Hospital Stay (HOSPITAL_COMMUNITY)
Admission: AD | Admit: 2012-04-15 | Discharge: 2012-04-16 | Disposition: A | Discharge status: Home / Self Care | Payer: Medicaid Other | Source: Ambulatory Visit | Attending: Obstetrics | Admitting: Obstetrics

## 2012-04-15 DIAGNOSIS — O2390 Unspecified genitourinary tract infection in pregnancy, unspecified trimester: Secondary | ICD-10-CM | POA: Insufficient documentation

## 2012-04-15 DIAGNOSIS — O4700 False labor before 37 completed weeks of gestation, unspecified trimester: Secondary | ICD-10-CM | POA: Insufficient documentation

## 2012-04-15 DIAGNOSIS — R109 Unspecified abdominal pain: Secondary | ICD-10-CM | POA: Insufficient documentation

## 2012-04-15 DIAGNOSIS — O2340 Unspecified infection of urinary tract in pregnancy, unspecified trimester: Secondary | ICD-10-CM

## 2012-04-15 DIAGNOSIS — N390 Urinary tract infection, site not specified: Secondary | ICD-10-CM | POA: Insufficient documentation

## 2012-04-15 LAB — URINALYSIS, ROUTINE W REFLEX MICROSCOPIC
Glucose, UA: NEGATIVE mg/dL
Hgb urine dipstick: NEGATIVE
Ketones, ur: NEGATIVE mg/dL
Protein, ur: NEGATIVE mg/dL
Urobilinogen, UA: 0.2 mg/dL (ref 0.0–1.0)

## 2012-04-15 LAB — URINE MICROSCOPIC-ADD ON

## 2012-04-15 NOTE — MAU Note (Signed)
Pt reports "hurting real bad" indicates rt lower abd and groin area. Also reports no bowel movement x 2 days.

## 2012-04-16 LAB — CBC WITH DIFFERENTIAL/PLATELET
Hemoglobin: 11.2 g/dL — ABNORMAL LOW (ref 12.0–15.0)
Lymphocytes Relative: 29 % (ref 12–46)
Lymphs Abs: 2.3 10*3/uL (ref 0.7–4.0)
Neutro Abs: 5.2 10*3/uL (ref 1.7–7.7)
Neutrophils Relative %: 64 % (ref 43–77)
Platelets: 102 10*3/uL — ABNORMAL LOW (ref 150–400)
RBC: 4.12 MIL/uL (ref 3.87–5.11)
WBC: 8.1 10*3/uL (ref 4.0–10.5)

## 2012-04-16 LAB — WET PREP, GENITAL

## 2012-04-16 MED ORDER — NITROFURANTOIN MONOHYD MACRO 100 MG PO CAPS: 100.0000 mg | ORAL_CAPSULE | Freq: Two times a day (BID) | ORAL | Status: AC

## 2012-04-16 NOTE — MAU Provider Note (Signed)
History     CSN: [id number]  Arrival date and time: 04/15/12 2059   First Provider Initiated Contact with Patient 04/15/12 2344      Chief Complaint  Patient presents with  . Abdominal Pain   HPI ROSAMARY Schmidt is a 26 y.o. female @ [redacted]w[redacted]d gestation who presents to MAU with abdominal pain. The pain started 3 days ago. She describes the pain as cramping that comes and goes. She rates the pain as 9/10. The pain is located in the lower abdomen. Associated symptoms include increase vaginal discharge, back pain, nausea and headache. She denies vaginal bleeding. She is concerned because with a previous pregnancy she had similar symptoms and had decreased fluid. Last sexual intercourse 2 days ago. The history was provided by the patient. OB History    Grav Para Term Preterm Abortions TAB SAB Ect Mult Living   4 2 1  1  1   2       Past Medical History  Diagnosis Date  . Abnormal Pap smear   . Gestational diabetes     2012    Past Surgical History  Procedure Date  . No past surgeries     Family History  Problem Relation Age of Onset  . Hypertension Father   . Diabetes Father   . Diabetes Mother   . Seizures Brother     History  Substance Use Topics  . Smoking status: Never Smoker   . Smokeless tobacco: Never Used  . Alcohol Use: No    Allergies:  Allergies  Allergen Reactions  . Latex Itching and Rash    No prescriptions prior to admission    Review of Systems  Constitutional: Negative for fever and chills.  HENT: Positive for ear pain. Negative for nosebleeds, congestion, sore throat and neck pain.   Eyes: Negative for blurred vision, double vision, photophobia and pain.  Respiratory: Negative for cough, shortness of breath and wheezing.   Cardiovascular: Positive for chest pain. Negative for palpitations and leg swelling.  Gastrointestinal: Positive for heartburn, nausea, abdominal pain and constipation. Negative for vomiting and diarrhea.  Genitourinary:  Positive for frequency. Negative for dysuria and urgency (pressure).  Musculoskeletal: Positive for back pain. Negative for myalgias.  Skin: Negative for itching and rash.  Neurological: Positive for dizziness and headaches. Negative for sensory change, speech change, seizures and weakness.  Endo/Heme/Allergies: Does not bruise/bleed easily.  Psychiatric/Behavioral: Negative for depression. The patient is not nervous/anxious.    Physical Exam   Blood pressure 97/54, pulse 87, temperature 98.2 F (36.8 C), temperature source Oral, resp. rate 18, height 5\' 5"  (1.651 m), weight 283 lb (128.368 kg), last menstrual period 08/14/2011, SpO2 99.00%, unknown if currently breastfeeding.  Physical Exam  Constitutional: She is oriented to person, place, and time. She appears well-developed and well-nourished. No distress.  HENT:  Head: Normocephalic and atraumatic.  Eyes: EOM are normal.  Neck: Neck supple.  Cardiovascular: Normal rate.   Respiratory: Effort normal.  GI: Soft. There is no tenderness.  Genitourinary:       External genitalia without lesions. Malodorous white discharge vaginal vault. Cervix closed, thick, high.          Musculoskeletal: Normal range of motion.  Neurological: She is alert and oriented to person, place, and time.  Skin: Skin is warm and dry.  Psychiatric: She has a normal mood and affect. Her behavior is normal. Judgment and thought content normal.   Results for orders placed during the hospital encounter of 04/15/12 (from  the past 24 hour(s))  URINALYSIS, ROUTINE W REFLEX MICROSCOPIC     Status: Abnormal   Collection Time   04/15/12  9:15 PM      Component Value Range   Color, Urine YELLOW  YELLOW   APPearance CLEAR  CLEAR   Specific Gravity, Urine >1.030 (*) 1.005 - 1.030   pH 6.0  5.0 - 8.0   Glucose, UA NEGATIVE  NEGATIVE mg/dL   Hgb urine dipstick NEGATIVE  NEGATIVE   Bilirubin Urine NEGATIVE  NEGATIVE   Ketones, ur NEGATIVE  NEGATIVE mg/dL   Protein,  ur NEGATIVE  NEGATIVE mg/dL   Urobilinogen, UA 0.2  0.0 - 1.0 mg/dL   Nitrite NEGATIVE  NEGATIVE   Leukocytes, UA SMALL (*) NEGATIVE  URINE MICROSCOPIC-ADD ON     Status: Abnormal   Collection Time   04/15/12  9:15 PM      Component Value Range   Squamous Epithelial / LPF FEW (*) RARE   WBC, UA 7-10  <3 WBC/hpf   RBC / HPF 3-6  <3 RBC/hpf   Bacteria, UA MANY (*) RARE  WET PREP, GENITAL     Status: Abnormal   Collection Time   04/16/12 12:05 AM      Component Value Range   Yeast Wet Prep HPF POC NONE SEEN  NONE SEEN   Trich, Wet Prep NONE SEEN  NONE SEEN   Clue Cells Wet Prep HPF POC FEW (*) NONE SEEN   WBC, Wet Prep HPF POC FEW (*) NONE SEEN  FETAL FIBRONECTIN     Status: Normal   Collection Time   04/16/12 12:05 AM      Component Value Range   Fetal Fibronectin NEGATIVE  NEGATIVE  CBC WITH DIFFERENTIAL     Status: Abnormal   Collection Time   04/16/12 12:25 AM      Component Value Range   WBC 8.1  4.0 - 10.5 K/uL   RBC 4.12  3.87 - 5.11 MIL/uL   Hemoglobin 11.2 (*) 12.0 - 15.0 g/dL   HCT 16.1 (*) 09.6 - 04.5 %   MCV 81.3  78.0 - 100.0 fL   MCH 27.2  26.0 - 34.0 pg   MCHC 33.4  30.0 - 36.0 g/dL   RDW 40.9  81.1 - 91.4 %   Platelets 102 (*) 150 - 400 K/uL   Neutrophils Relative 64  43 - 77 %   Neutro Abs 5.2  1.7 - 7.7 K/uL   Lymphocytes Relative 29  12 - 46 %   Lymphs Abs 2.3  0.7 - 4.0 K/uL   Monocytes Relative 6  3 - 12 %   Monocytes Absolute 0.5  0.1 - 1.0 K/uL   Eosinophils Relative 1  0 - 5 %   Eosinophils Absolute 0.1  0.0 - 0.7 K/uL   Basophils Relative 0  0 - 1 %   Basophils Absolute 0.0  0.0 - 0.1 K/uL   EFM reactive tracing, no decelerations, baseline 145, occasional contraction. MAU Course: Discussed with Dr. Gaynell Face  Procedures Assessment: Abdominal pain in pregnancy   Possible early UTI  Plan:  Urine sent for culture l  Rx Macrobid   Follow up in the office Follow-up Information    Call Kathreen Cosier, MD.   Contact information:   [address] [place] [address] [phone]         Medication List  As of 04/16/2012  5:47 AM   START taking these medications  nitrofurantoin (macrocrystal-monohydrate) 100 MG capsule   Commonly known as: MACROBID   Take 1 capsule (100 mg total) by mouth 2 (two) times daily.         CONTINUE taking these medications         cyclobenzaprine 10 MG tablet   Commonly known as: FLEXERIL      prenatal multivitamin Tabs          Where to get your medications    These are the prescriptions that you need to pick up.   You may get these medications from any pharmacy.         nitrofurantoin (macrocrystal-monohydrate) 100 MG capsule          Discussed with the patient and all questioned fully answered. She will call or return if any problems arise. Andreika Vandagriff, RN, FNP, [facility] 04/16/2012, 5:46 AM

## 2012-04-17 LAB — URINE CULTURE
Colony Count: >=100000
Special Requests: NORMAL

## 2012-04-30 ENCOUNTER — Encounter (HOSPITAL_COMMUNITY): Payer: Self-pay | Admitting: Obstetrics

## 2012-05-08 ENCOUNTER — Ambulatory Visit (HOSPITAL_COMMUNITY)
Admission: RE | Admit: 2012-05-08 | Discharge: 2012-05-08 | Disposition: A | Discharge status: Home / Self Care | Payer: Medicaid Other | Source: Ambulatory Visit | Attending: Obstetrics | Admitting: Obstetrics

## 2012-05-08 ENCOUNTER — Encounter: Payer: Medicaid Other | Attending: Obstetrics | Admitting: Dietician

## 2012-05-08 ENCOUNTER — Other Ambulatory Visit: Payer: Self-pay | Admitting: Obstetrics

## 2012-05-08 ENCOUNTER — Encounter (HOSPITAL_COMMUNITY): Payer: Self-pay

## 2012-05-08 DIAGNOSIS — O99810 Abnormal glucose complicating pregnancy: Secondary | ICD-10-CM | POA: Insufficient documentation

## 2012-05-08 DIAGNOSIS — Z713 Dietary counseling and surveillance: Secondary | ICD-10-CM | POA: Insufficient documentation

## 2012-05-08 NOTE — ED Notes (Signed)
Diabetes Education: HT: 66 in, WT: 284.0 lb  G3 P2.  EDD: 07/02/2012.  Seen for GDM Counseling.  3 hr OGGT: Fasting=109, 1 hr= 222; 2 hr= 190; 3 hr= 81.  Currently taking prenatal vitamins.  Only other medication is Tylenol #3 for headaches.  Currently working approximately 4-5 hours per day, 5 days per week at [employer].  No regular exercise other than walking to and from the bus stop on a daily basis for her transportation.  Completed review of the diet and self-care management for gestational diabetes.  Review of carb counting and the menu/meal carbohydrate recommendations.  Provided an Accu-Chek Smartview Meter Kit with Accu-Chek Softclix lancing device.  Provided instruction and on return demonstration her blood glucose was 130 mg at 2 :15 PM. Instructed to monitor fasting and 2 hr post-meal blood glucose levels, to record blood glucose levels and to take her meter and blood glucose log to all clinic appointments.  Maggie Kelton Bultman, RN, CDE

## 2012-05-09 NOTE — Consult Note (Signed)
MATERNAL FETAL MEDICINE CONSULT  Patient Name: Monique Schmidt Medical Record Number:  [id number] Date of Birth: 1986-07-28 Requesting Physician Name:  No att. providers found Date of Service: 05/09/2012  Chief Complaint GDM  History of Present Illness Monique Schmidt was seen today for consultation due to a recent diagnosis of gestational diabetes at the request of Dr. Gaynell Schmidt.  The patient is a 26 y.o. [id number],JY [redacted]w[redacted]d with an EDD of 07/02/2012. She has a history of GDM in her previous two pregnancies. In this currently pregnancy, her 3 hour OGTT results were: 782,956,213 and 81. Monique Schmidt saw her today for diabetes teaching.  It was also noticed in a review of her labs that she has thrombocytopenia. Monique Schmidt did not mention this to me. Her platelet counts found in EPIC are as follows: 09/17/10 - 128; 03/08/11 - 60; 03/09/11 - 72; and 04/16/12 - 102.  Review of Systems Pertinent items are noted in HPI.  Patient History OB History    Grav Para Term Preterm Abortions TAB SAB Ect Mult Living   4 2 1  1  1   2      # Outc Date GA Lbr Len/2nd Wgt Sex Del Anes PTL Lv   1 PAR 9/10    F SVD None No Yes   2 TRM 7/12 [redacted]w[redacted]d 01:27 / 00:02 8lb9oz(3.884kg) F SVD None  Yes   Comments: none   3 SAB            4 CUR               Past Medical History  Diagnosis Date  . Abnormal Pap smear   . Gestational diabetes     2012    Past Surgical History  Procedure Date  . No past surgeries     History   Social History  . Marital Status: Single    Spouse Name: N/A    Number of Children: N/A  . Years of Education: N/A   Social History Main Topics  . Smoking status: Never Smoker   . Smokeless tobacco: Never Used  . Alcohol Use: No  . Drug Use: No  . Sexually Active: Yes    Birth Control/ Protection: None     lst time 1 week ago   Other Topics Concern  . Not on file   Social History Narrative  . No narrative on file    Family History  Problem Relation Age of Onset  .  Hypertension Father   . Diabetes Father   . Diabetes Mother   . Seizures Brother    In addition, the patient has no family history of mental retardation, birth defects, or genetic diseases.  Physical Examination There were no vitals filed for this visit.   Assessment and Recommendations 1) IUP at 32 weeks 2) GDM 3) H/O GDM in past 2 pregnancies 4) Thrombocytopenia 5) Obesity  Monique Schmidt had diabetic teaching today by Monique Schmidt. She understands the goals of therapy include fasting blood sugars less than 90 mg/dl and 2-hour postprandial less than 120 mg/dl with avoidance of hypoglycemia. As pregnancy progresses if diet therapy is not successful in achieving adequate glycemic control, she will require medical therapy. I recommend first starting glyburide 2.5 mg orally with breakfast and increasing as needed to reach desired glycemic goals. If the maximum daily dose of glyburide is reached (10 mg bid) and glycemic control remains inadequate, the patient should be stared on insulin. If the patient requires medical treatment of her  diabetes then fetal surveillance should be instituted with interval serial growth ultrasounds every 4 weeks and twice weekly antepartum testing beginning at 32 weeks.   We reviewed that the goal of good glycemic control is to prevent macrosomia and the subsequent complications such as birth trauma, increased need for C/S and metabolic disturbances in the neonate.  Monique Schmidt will start the diabetic diet and begin checking fasting and 2 hour postprandial blood sugars. She was asked to contact us within a week with her BSs.  As far as I know, she has not had a work-up for thrombocytopenia and she has not been seen a hematologist. 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  Suggest checking a platelet count every 2 weeks. She Schmidt need a course of steroids to receive regional anesthesia.    Monique Nearing,  MD

## 2012-05-25 ENCOUNTER — Inpatient Hospital Stay (HOSPITAL_COMMUNITY)
Admission: AD | Admit: 2012-05-25 | Discharge: 2012-05-25 | Disposition: A | Discharge status: Home / Self Care | Payer: Medicaid Other | Source: Ambulatory Visit | Attending: Obstetrics | Admitting: Obstetrics

## 2012-05-25 ENCOUNTER — Encounter (HOSPITAL_COMMUNITY): Payer: Self-pay | Admitting: *Deleted

## 2012-05-25 DIAGNOSIS — R51 Headache: Secondary | ICD-10-CM | POA: Insufficient documentation

## 2012-05-25 DIAGNOSIS — O2650 Maternal hypotension syndrome, unspecified trimester: Secondary | ICD-10-CM | POA: Insufficient documentation

## 2012-05-25 DIAGNOSIS — O26899 Other specified pregnancy related conditions, unspecified trimester: Secondary | ICD-10-CM

## 2012-05-25 DIAGNOSIS — O479 False labor, unspecified: Secondary | ICD-10-CM

## 2012-05-25 DIAGNOSIS — O4700 False labor before 37 completed weeks of gestation, unspecified trimester: Secondary | ICD-10-CM | POA: Insufficient documentation

## 2012-05-25 LAB — URINE MICROSCOPIC-ADD ON

## 2012-05-25 LAB — COMPREHENSIVE METABOLIC PANEL
AST: 27 U/L (ref 0–37)
BUN: 10 mg/dL (ref 6–23)
CO2: 22 mEq/L (ref 19–32)
Chloride: 101 mEq/L (ref 96–112)
Creatinine, Ser: 0.63 mg/dL (ref 0.50–1.10)
GFR calc non Af Amer: >90 mL/min (ref >90)
Glucose, Bld: 117 mg/dL — ABNORMAL HIGH (ref 70–99)
Total Bilirubin: 0.2 mg/dL — ABNORMAL LOW (ref 0.3–1.2)

## 2012-05-25 LAB — CBC
HCT: 37.2 % (ref 36.0–46.0)
Hemoglobin: 12.1 g/dL (ref 12.0–15.0)
MCV: 80.3 fL (ref 78.0–100.0)
Platelets: 95 10*3/uL — ABNORMAL LOW (ref 150–400)
RBC: 4.63 MIL/uL (ref 3.87–5.11)
WBC: 9 10*3/uL (ref 4.0–10.5)

## 2012-05-25 LAB — WET PREP, GENITAL
Trich, Wet Prep: NONE SEEN
Yeast Wet Prep HPF POC: NONE SEEN

## 2012-05-25 LAB — URINALYSIS, ROUTINE W REFLEX MICROSCOPIC
Ketones, ur: 15 mg/dL — AB
Nitrite: NEGATIVE
pH: 6 (ref 5.0–8.0)

## 2012-05-25 MED ORDER — NIFEDIPINE 10 MG PO CAPS
20.0000 mg | ORAL_CAPSULE | Freq: Once | ORAL | Status: AC
  Administered 2012-05-25: 20 mg via ORAL
  Filled 2012-05-25: qty 2

## 2012-05-25 MED ORDER — LACTATED RINGERS IV BOLUS (SEPSIS)
1000.0000 mL | Freq: Once | INTRAVENOUS | Status: AC
  Administered 2012-05-25: 1000 mL via INTRAVENOUS

## 2012-05-25 NOTE — MAU Provider Note (Signed)
History     CSN: [id number]  Arrival date and time: 05/25/12 1736   None     Chief Complaint  Patient presents with  . Headache  . Contractions  . Loss of Consciousness   HPI 26 y.o. [id number] at [redacted]w[redacted]d with contractions q 3-5 min since last night, no bleeding or LOF. States contractions are getting worse. Headache since last night - no better with benadryl. Also c/o sinus symptoms. Has frequent headaches in pregnancy. Also reports episode at work earlier today, vision "went black" for five-eight minutes while sitting on toilet (states she had her eyes closed during this time). Did not fall or lose consciousness.    Past Medical History  Diagnosis Date  . Abnormal Pap smear   . Gestational diabetes     2012    Past Surgical History  Procedure Date  . No past surgeries     Family History  Problem Relation Age of Onset  . Hypertension Father   . Diabetes Father   . Diabetes Mother   . Seizures Brother     History  Substance Use Topics  . Smoking status: Never Smoker   . Smokeless tobacco: Never Used  . Alcohol Use: No    Allergies:  Allergies  Allergen Reactions  . Latex Itching and Rash    Prescriptions prior to admission  Medication Sig Dispense Refill  . acetaminophen-codeine (TYLENOL #3) 300-30 MG per tablet Take 1 tablet by mouth every 4 (four) hours as needed. pain      . cyclobenzaprine (FLEXERIL) 10 MG tablet Take 10 mg by mouth 3 (three) times daily as needed. For back pain      . Prenatal Vit-Fe Fumarate-FA (PRENATAL MULTIVITAMIN) TABS Take 1 tablet by mouth daily.        Review of Systems  Constitutional: Negative.   Respiratory: Negative.   Cardiovascular: Negative.   Gastrointestinal: Positive for abdominal pain. Negative for nausea, vomiting, diarrhea and constipation.  Genitourinary: Negative for dysuria, urgency, frequency, hematuria and flank pain.       Negative for vaginal bleeding, Positive cramping/contractions and discharge with odor    Musculoskeletal: Negative.   Neurological: Positive for headaches.  Psychiatric/Behavioral: Negative.    Physical Exam   Blood pressure 113/67, pulse 95, temperature 97.7 F (36.5 C), temperature source Oral, resp. rate 20, last menstrual period 08/14/2011, SpO2 100.00%, unknown if currently breastfeeding.  Physical Exam  Nursing note and vitals reviewed. Constitutional: She is oriented to person, place, and time. She appears well-developed and well-nourished. No distress.  Cardiovascular: Normal rate.   Respiratory: Effort normal.  GI: Soft. There is no tenderness.  Genitourinary: No bleeding around the vagina. No vaginal discharge found.       SVE: FT/thick/ballotable   Musculoskeletal: Normal range of motion.  Neurological: She is alert and oriented to person, place, and time.  Skin: Skin is warm and dry.  Psychiatric: She has a normal mood and affect.   EFM: reactive, TOCO: q 3-5 minutes   MAU Course  Procedures Results for orders placed during the hospital encounter of 05/25/12 (from the past 48 hour(s))  CBC     Status: Abnormal   Collection Time   05/25/12  6:03 PM      Component Value Range Comment   WBC 9.0  4.0 - 10.5 K/uL    RBC 4.63  3.87 - 5.11 MIL/uL    Hemoglobin 12.1  12.0 - 15.0 g/dL    HCT 14.7  82.9 - 56.2 %  MCV 80.3  78.0 - 100.0 fL    MCH 26.1  26.0 - 34.0 pg    MCHC 32.5  30.0 - 36.0 g/dL    RDW 16.1  09.6 - 04.5 %    Platelets 95 (*) 150 - 400 K/uL   COMPREHENSIVE METABOLIC PANEL     Status: Abnormal   Collection Time   05/25/12  6:03 PM      Component Value Range Comment   Sodium 135  135 - 145 mEq/L    Potassium 3.3 (*) 3.5 - 5.1 mEq/L    Chloride 101  96 - 112 mEq/L    CO2 22  19 - 32 mEq/L    Glucose, Bld 117 (*) 70 - 99 mg/dL    BUN 10  6 - 23 mg/dL    Creatinine, Ser 4.09  0.50 - 1.10 mg/dL    Calcium 9.1  8.4 - 81.1 mg/dL    Total Protein 6.4  6.0 - 8.3 g/dL    Albumin 2.7 (*) 3.5 - 5.2 g/dL    AST 27  0 - 37 U/L    ALT 34  0 -  35 U/L    Alkaline Phosphatase 88  39 - 117 U/L    Total Bilirubin 0.2 (*) 0.3 - 1.2 mg/dL    GFR calc non Af Amer >90  >90 mL/min    GFR calc Af Amer >90  >90 mL/min   URINALYSIS, ROUTINE W REFLEX MICROSCOPIC     Status: Abnormal   Collection Time   05/25/12  6:25 PM      Component Value Range Comment   Color, Urine YELLOW  YELLOW    APPearance CLOUDY (*) CLEAR    Specific Gravity, Urine >1.030 (*) 1.005 - 1.030    pH 6.0  5.0 - 8.0    Glucose, UA NEGATIVE  NEGATIVE mg/dL    Hgb urine dipstick NEGATIVE  NEGATIVE    Bilirubin Urine SMALL (*) NEGATIVE    Ketones, ur 15 (*) NEGATIVE mg/dL    Protein, ur NEGATIVE  NEGATIVE mg/dL    Urobilinogen, UA 1.0  0.0 - 1.0 mg/dL    Nitrite NEGATIVE  NEGATIVE    Leukocytes, UA TRACE (*) NEGATIVE   WET PREP, GENITAL     Status: Abnormal   Collection Time   05/25/12  6:25 PM      Component Value Range Comment   Yeast Wet Prep HPF POC NONE SEEN  NONE SEEN    Trich, Wet Prep NONE SEEN  NONE SEEN    Clue Cells Wet Prep HPF POC NONE SEEN  NONE SEEN    WBC, Wet Prep HPF POC FEW (*) NONE SEEN MODERATE BACTERIA SEEN  URINE MICROSCOPIC-ADD ON     Status: Abnormal   Collection Time   05/25/12  6:25 PM      Component Value Range Comment   Squamous Epithelial / LPF MANY (*) RARE    WBC, UA 3-6  <3 WBC/hpf    Bacteria, UA FEW (*) RARE    Urine-Other MUCOUS PRESENT         . lactated ringers  1,000 mL Intravenous Once  . NIFEdipine  20 mg Oral Once     Assessment and Plan   1. Preterm contractions   2. Headache in pregnancy       Medication List     As of 05/26/2012  7:04 PM    CONTINUE taking these medications         acetaminophen-codeine  300-30 MG per tablet   Commonly known as: TYLENOL #3      cyclobenzaprine 10 MG tablet   Commonly known as: FLEXERIL      prenatal multivitamin Tabs            Follow-up Information    Follow up with MARSHALL,BERNARD A, MD. (as scheduled)    Contact information:   [address]  [address] [place] [address] [phone]            FRAZIER,NATALIE 05/25/2012, 6:03 PM

## 2012-05-25 NOTE — MAU Note (Signed)
Pts states she was having shortness of breath last night, states she went to the bathroom at work and passed out today

## 2012-06-08 ENCOUNTER — Encounter (HOSPITAL_COMMUNITY): Payer: Self-pay | Admitting: *Deleted

## 2012-06-08 ENCOUNTER — Inpatient Hospital Stay (HOSPITAL_COMMUNITY)
Admission: AD | Admit: 2012-06-08 | Discharge: 2012-06-08 | Disposition: A | Discharge status: Home / Self Care | Payer: Medicaid Other | Source: Ambulatory Visit | Attending: Obstetrics | Admitting: Obstetrics

## 2012-06-08 DIAGNOSIS — O479 False labor, unspecified: Secondary | ICD-10-CM | POA: Insufficient documentation

## 2012-06-08 NOTE — MAU Note (Signed)
Pt states she has been having contractions for a couple of hours

## 2012-06-08 NOTE — MAU Note (Signed)
To recheck pt-if no cervical change-to discharge home-no need to call Dr Tamela Oddi back

## 2012-06-16 ENCOUNTER — Other Ambulatory Visit: Payer: Self-pay | Admitting: Obstetrics and Gynecology

## 2012-06-16 ENCOUNTER — Encounter: Payer: Self-pay | Admitting: Obstetrics and Gynecology

## 2012-06-16 ENCOUNTER — Encounter: Payer: Self-pay | Admitting: *Deleted

## 2012-06-16 ENCOUNTER — Encounter: Payer: Medicaid Other | Attending: Obstetrics | Admitting: Dietician

## 2012-06-16 ENCOUNTER — Ambulatory Visit (INDEPENDENT_AMBULATORY_CARE_PROVIDER_SITE_OTHER): Payer: Medicaid Other | Admitting: Obstetrics and Gynecology

## 2012-06-16 ENCOUNTER — Ambulatory Visit (HOSPITAL_COMMUNITY)
Admission: RE | Admit: 2012-06-16 | Discharge: 2012-06-16 | Disposition: A | Discharge status: Home / Self Care | Payer: Medicaid Other | Source: Ambulatory Visit | Attending: Obstetrics and Gynecology | Admitting: Obstetrics and Gynecology

## 2012-06-16 VITALS — BP 106/76 | Temp 96.8°F | Wt 287.7 lb

## 2012-06-16 DIAGNOSIS — O99810 Abnormal glucose complicating pregnancy: Secondary | ICD-10-CM | POA: Insufficient documentation

## 2012-06-16 DIAGNOSIS — O0990 Supervision of high risk pregnancy, unspecified, unspecified trimester: Secondary | ICD-10-CM

## 2012-06-16 DIAGNOSIS — O24419 Gestational diabetes mellitus in pregnancy, unspecified control: Secondary | ICD-10-CM

## 2012-06-16 DIAGNOSIS — E669 Obesity, unspecified: Secondary | ICD-10-CM | POA: Insufficient documentation

## 2012-06-16 DIAGNOSIS — O24919 Unspecified diabetes mellitus in pregnancy, unspecified trimester: Secondary | ICD-10-CM

## 2012-06-16 DIAGNOSIS — Z713 Dietary counseling and surveillance: Secondary | ICD-10-CM | POA: Insufficient documentation

## 2012-06-16 DIAGNOSIS — E119 Type 2 diabetes mellitus without complications: Secondary | ICD-10-CM

## 2012-06-16 LAB — POCT URINALYSIS DIP (DEVICE)
Bilirubin Urine: NEGATIVE
Hgb urine dipstick: NEGATIVE
Ketones, ur: NEGATIVE mg/dL
pH: 6.5 (ref 5.0–8.0)

## 2012-06-16 LAB — GLUCOSE, CAPILLARY: Glucose-Capillary: 186 mg/dL — ABNORMAL HIGH (ref 70–99)

## 2012-06-16 NOTE — Progress Notes (Signed)
Nutrition Note: 1st visit consult Pt seen for GDM diet education. Pt has gained 27.7# @ [redacted]w[redacted]d, which is > expected. Pt reports having 3 meals & 3 snacks/ d and drinks mostly water. Pt has had GDM during all of her pregnancies & controlled them with diet. When asked what foods affect her BS, pt stated starches, juice & soda. Pt reports no N&V & is taking PNV. Pt given verbal & written GDM education. Reminded pt of all foods that affect BS & reviewed sample meal plan. Disc wt gain goals of 11-20# total or 0.5#/wk. Disc importance of BF & physical activity & eating healthy postpartum to help with wt loss.  Pt agrees to follow GDM diet including 3 meals & 3 snacks with proper CHO/ protein combination. Pt does not receive WIC but plans to apply. Pt does plan to BF. F/u as needed Blondell Reveal, MS, RD, LDN

## 2012-06-16 NOTE — Progress Notes (Signed)
U/S scheduled today at 145pm.

## 2012-06-16 NOTE — Progress Notes (Signed)
Pulse- 93  Edema-feet  Pain/pressure- lower abd  Contractions- started this morning lasted 3 min x 2 Clear vaginal discharge New ob packet given Discuss weight gain 11-20lb

## 2012-06-16 NOTE — Progress Notes (Incomplete)
Diabetes Education:  Seen this AM for meter and instruction.  Her youngest baby is 69 months old and she still has the meter (Accu-Chek) that we gave her with the last pregnancy.  But, she does not have the $3.00 co-pay to buy her strips and lancets for monitoring.  The baby is due on 06/30/2012.  I went ahead and provide her with a True Track meter kit Lot: [id number] Exp: 2014/06/07 and 1 box of strips: [id number] Exp: 2014/07/19 and 1 box Lancets:Lot: [id number]  Exp: 2016/01/21.  On return demonstration, her glucose post-breakfast was 132 using this meter.  Instructed to check glucose levels fasting and 2 hours after the first bite of each meal.  Instructed to record and to bring glucose log and meter to all clinic appointments.  Provided handout "Nutrition, Diabetes and Pregnancy" and reviewed the testing schedule.  [name], RN, RD, CDE

## 2012-06-16 NOTE — Progress Notes (Signed)
Patient transferred care from Dr. Gaynell Face. Patient reports this pregnany complicated by GDM- diet control. All pregnancies affected by GDM, last baby 8lb9oz (biggest baby). Patient did not bring meter or log book. Random CBG today- 186 (patient had bacon and egg croissant 1hr ago). Will schedule follow-up growth ultrasound. Will request records from Dr. Elsie Stain office. FM/labor precautions reviewed GBS and cultures done today. Patient planning on using Nexplanon for birth control

## 2012-06-17 ENCOUNTER — Encounter: Payer: Self-pay | Admitting: *Deleted

## 2012-06-17 LAB — GC/CHLAMYDIA PROBE AMP, GENITAL: GC Probe Amp, Genital: NEGATIVE

## 2012-06-19 ENCOUNTER — Encounter: Payer: Self-pay | Admitting: Obstetrics and Gynecology

## 2012-06-23 ENCOUNTER — Encounter: Payer: Self-pay | Admitting: Family Medicine

## 2012-06-23 ENCOUNTER — Ambulatory Visit (INDEPENDENT_AMBULATORY_CARE_PROVIDER_SITE_OTHER): Payer: Medicaid Other | Admitting: Family Medicine

## 2012-06-23 ENCOUNTER — Encounter: Payer: Medicaid Other | Admitting: Obstetrics & Gynecology

## 2012-06-23 ENCOUNTER — Encounter (HOSPITAL_COMMUNITY): Payer: Self-pay | Admitting: *Deleted

## 2012-06-23 ENCOUNTER — Telehealth (HOSPITAL_COMMUNITY): Payer: Self-pay | Admitting: *Deleted

## 2012-06-23 VITALS — BP 112/81 | Temp 97.6°F | Wt 288.0 lb

## 2012-06-23 DIAGNOSIS — O0990 Supervision of high risk pregnancy, unspecified, unspecified trimester: Secondary | ICD-10-CM

## 2012-06-23 DIAGNOSIS — N898 Other specified noninflammatory disorders of vagina: Secondary | ICD-10-CM

## 2012-06-23 DIAGNOSIS — O24919 Unspecified diabetes mellitus in pregnancy, unspecified trimester: Secondary | ICD-10-CM

## 2012-06-23 LAB — POCT URINALYSIS DIP (DEVICE)
Bilirubin Urine: NEGATIVE
Ketones, ur: NEGATIVE mg/dL
Protein, ur: NEGATIVE mg/dL

## 2012-06-23 MED ORDER — METRONIDAZOLE 500 MG PO TABS: 500.0000 mg | ORAL_TABLET | Freq: Two times a day (BID) | ORAL | Status: DC

## 2012-06-23 MED ORDER — GLYBURIDE 5 MG PO TABS: 5.0000 mg | ORAL_TABLET | Freq: Two times a day (BID) | ORAL | Status: DC

## 2012-06-23 NOTE — Progress Notes (Signed)
Pulse:77 Has a thin clear discharge, c/o odor.

## 2012-06-23 NOTE — Telephone Encounter (Signed)
Preadmission screen  

## 2012-06-23 NOTE — Progress Notes (Signed)
Scheduled for IOL on 06/25/12 @ 0730

## 2012-06-23 NOTE — Progress Notes (Signed)
FBS-112-165 2 hr pp 85-240 1 in range--truly an A2--needs induction.  Will start Glyburide for a couple of days to try to improve neonatal BS. U/S 10/28--6 # 13 oz, vtx, AFI 15, AC measures ahead.  Denies issues with shoulders at previous births. Needs NST for fetal well being today.  NST reviewed and reactive.

## 2012-06-25 ENCOUNTER — Encounter (HOSPITAL_COMMUNITY): Payer: Self-pay

## 2012-06-25 ENCOUNTER — Inpatient Hospital Stay (HOSPITAL_COMMUNITY)
Admission: RE | Admit: 2012-06-25 | Discharge: 2012-06-28 | DRG: 775 | Disposition: A | Discharge status: Home / Self Care | Payer: Medicaid Other | Source: Ambulatory Visit | Attending: Obstetrics & Gynecology | Admitting: Obstetrics & Gynecology

## 2012-06-25 DIAGNOSIS — O99814 Abnormal glucose complicating childbirth: Principal | ICD-10-CM | POA: Diagnosis present

## 2012-06-25 DIAGNOSIS — B9689 Other specified bacterial agents as the cause of diseases classified elsewhere: Secondary | ICD-10-CM | POA: Diagnosis present

## 2012-06-25 DIAGNOSIS — D689 Coagulation defect, unspecified: Secondary | ICD-10-CM | POA: Diagnosis present

## 2012-06-25 DIAGNOSIS — O2390 Unspecified genitourinary tract infection in pregnancy, unspecified trimester: Secondary | ICD-10-CM | POA: Diagnosis present

## 2012-06-25 DIAGNOSIS — D696 Thrombocytopenia, unspecified: Secondary | ICD-10-CM | POA: Diagnosis present

## 2012-06-25 DIAGNOSIS — N898 Other specified noninflammatory disorders of vagina: Secondary | ICD-10-CM

## 2012-06-25 DIAGNOSIS — O24919 Unspecified diabetes mellitus in pregnancy, unspecified trimester: Secondary | ICD-10-CM

## 2012-06-25 DIAGNOSIS — A499 Bacterial infection, unspecified: Secondary | ICD-10-CM | POA: Diagnosis present

## 2012-06-25 DIAGNOSIS — O0990 Supervision of high risk pregnancy, unspecified, unspecified trimester: Secondary | ICD-10-CM

## 2012-06-25 DIAGNOSIS — N760 Acute vaginitis: Secondary | ICD-10-CM | POA: Diagnosis present

## 2012-06-25 LAB — CBC
MCH: 27.4 pg (ref 26.0–34.0)
Platelets: 69 10*3/uL — ABNORMAL LOW (ref 150–400)
RBC: 4.63 MIL/uL (ref 3.87–5.11)
WBC: 8.4 10*3/uL (ref 4.0–10.5)

## 2012-06-25 LAB — BASIC METABOLIC PANEL
Calcium: 8.6 mg/dL (ref 8.4–10.5)
GFR calc Af Amer: >90 mL/min (ref >90)
GFR calc non Af Amer: >90 mL/min (ref >90)
Potassium: 3.3 mEq/L — ABNORMAL LOW (ref 3.5–5.1)
Sodium: 135 mEq/L (ref 135–145)

## 2012-06-25 LAB — PREPARE RBC (CROSSMATCH)

## 2012-06-25 LAB — RPR: RPR Ser Ql: NONREACTIVE

## 2012-06-25 LAB — GLUCOSE, CAPILLARY: Glucose-Capillary: 90 mg/dL (ref 70–99)

## 2012-06-25 MED ORDER — OXYTOCIN 40 UNITS IN LACTATED RINGERS INFUSION - SIMPLE MED: 62.5000 mL/h | INTRAVENOUS | Status: DC

## 2012-06-25 MED ORDER — CITRIC ACID-SODIUM CITRATE 334-500 MG/5ML PO SOLN: 30.0000 mL | ORAL | Status: DC | PRN

## 2012-06-25 MED ORDER — NALBUPHINE SYRINGE 5 MG/0.5 ML
10.0000 mg | INJECTION | INTRAMUSCULAR | Status: DC | PRN
  Administered 2012-06-25 – 2012-06-26 (×5): 10 mg via INTRAVENOUS
  Filled 2012-06-25 (×4): qty 1
  Filled 2012-06-25 (×2): qty 0.5

## 2012-06-25 MED ORDER — TERBUTALINE SULFATE 1 MG/ML IJ SOLN: 0.2500 mg | Freq: Once | INTRAMUSCULAR | Status: AC | PRN

## 2012-06-25 MED ORDER — LIDOCAINE HCL (PF) 1 % IJ SOLN
30.0000 mL | INTRAMUSCULAR | Status: DC | PRN
  Filled 2012-06-25: qty 30

## 2012-06-25 MED ORDER — OXYTOCIN 40 UNITS IN LACTATED RINGERS INFUSION - SIMPLE MED
1.0000 m[IU]/min | INTRAVENOUS | Status: DC
  Administered 2012-06-25: 2 m[IU]/min via INTRAVENOUS
  Filled 2012-06-25: qty 1000

## 2012-06-25 MED ORDER — ACETAMINOPHEN 325 MG PO TABS: 650.0000 mg | ORAL_TABLET | ORAL | Status: DC | PRN

## 2012-06-25 MED ORDER — IBUPROFEN 600 MG PO TABS
600.0000 mg | ORAL_TABLET | Freq: Four times a day (QID) | ORAL | Status: DC | PRN
  Administered 2012-06-26: 600 mg via ORAL
  Filled 2012-06-25: qty 1

## 2012-06-25 MED ORDER — LACTATED RINGERS IV SOLN: 500.0000 mL | INTRAVENOUS | Status: DC | PRN

## 2012-06-25 MED ORDER — OXYTOCIN BOLUS FROM INFUSION
500.0000 mL | INTRAVENOUS | Status: DC
  Administered 2012-06-26: 500 mL via INTRAVENOUS

## 2012-06-25 MED ORDER — ONDANSETRON HCL 4 MG/2ML IJ SOLN: 4.0000 mg | Freq: Four times a day (QID) | INTRAMUSCULAR | Status: DC | PRN

## 2012-06-25 MED ORDER — OXYCODONE-ACETAMINOPHEN 5-325 MG PO TABS
1.0000 | ORAL_TABLET | ORAL | Status: DC | PRN
  Administered 2012-06-26: 1 via ORAL
  Filled 2012-06-25: qty 1

## 2012-06-25 MED ORDER — LACTATED RINGERS IV SOLN
INTRAVENOUS | Status: DC
  Administered 2012-06-25 (×3): via INTRAVENOUS

## 2012-06-25 NOTE — H&P (Signed)
Monique Schmidt is a 26 y.o. female [phone] [redacted]w[redacted]d presenting for IOL d/t uncontrolled gestational diabetes. She had followed with Dr. Gaynell Face for part of her pregnancy and appeared to be non-compliant with her diabetes regimen. She presented to the [facility] earlier this week for treatment and was scheduled for induction. She was placed on Glyburide for the interm time, and asked to hold it for today. She also had been taking flagyl for BV since Monday. She has a history of thrombocytopenia. She has delivered two babies by SVD. Maternal Medical History:  Reason for admission: Reason for Admission:   nauseaFetal activity: Perceived fetal activity is normal.    Prenatal complications: Thrombocytopenia.   Prenatal Complications - Diabetes: gestational.    OB History    Grav Para Term Preterm Abortions TAB SAB Ect Mult Living   4 2 2  1  1   2      Past Medical History  Diagnosis Date  . Abnormal Pap smear   . Gestational diabetes     all pregnancies glyburide started 11/4   Past Surgical History  Procedure Date  . No past surgeries    Family History: family history includes Diabetes in her father and mother; Hypertension in her father; and Seizures in her brother. Social History:  reports that she has never smoked. She has never used smokeless tobacco. She reports that she does not drink alcohol or use illicit drugs.   Prenatal Transfer Tool  Maternal Diabetes: Yes:  Diabetes Type:  Diet controlled Genetic Screening: Normal Maternal Ultrasounds/Referrals: Normal Fetal Ultrasounds or other Referrals:  None Maternal Substance Abuse:  No Significant Maternal Medications:  None Significant Maternal Lab Results:  Lab values include: Group B Strep negative Other Comments:  uncontrolled diabeteic   Review of Systems  Constitutional: Negative for fever and chills.  Eyes: Negative for blurred vision and double vision.  Respiratory: Negative for shortness of breath.   Cardiovascular:  Negative for chest pain.  Gastrointestinal: Negative for nausea, vomiting, abdominal pain and diarrhea.  Genitourinary: Negative for dysuria.  Musculoskeletal: Negative for back pain.  Skin: Negative for rash.  Neurological: Negative for dizziness, weakness and headaches.  All other systems reviewed and are negative.      Blood pressure 110/74, pulse 86, temperature 98.2 F (36.8 C), temperature source Oral, resp. rate 20, height 5\' 6"  (1.676 m), weight 130.636 kg (288 lb), last menstrual period 08/14/2011, unknown if currently breastfeeding. Maternal Exam:  Introitus: Normal vulva. Normal vagina.  Vagina is negative for discharge.  Pelvis: adequate for delivery.   Cervix: Cervix evaluated by digital exam.     Fetal Exam Fetal Monitor Review: Variability: moderate (6-25 bpm).   Pattern: accelerations present and no decelerations.    Fetal State Assessment: Category I - tracings are normal.     Physical Exam  Constitutional: She is oriented to person, place, and time. She appears well-developed and well-nourished. No distress.       Obese   HENT:  Head: Normocephalic and atraumatic.  Eyes: Conjunctivae normal and EOM are normal. Pupils are equal, round, and reactive to light. Right eye exhibits no discharge. Left eye exhibits no discharge. No scleral icterus.  Neck: Normal range of motion. Neck supple.  Cardiovascular: Normal rate, regular rhythm, normal heart sounds and intact distal pulses.   No murmur heard. Respiratory: Effort normal and breath sounds normal. No respiratory distress. She has no wheezes. She has no rales.  GI: Soft. Bowel sounds are normal. She exhibits no distension. There  is no tenderness. There is no rebound and no guarding.  Genitourinary: Vagina normal and uterus normal. No vaginal discharge found.  Musculoskeletal: She exhibits no edema and no tenderness.  Neurological: She is alert and oriented to person, place, and time.  Skin: Skin is warm and  dry. No rash noted.    Prenatal labs: ABO, Rh: A/Positive/-- (04/29 0000) Antibody: Negative (04/29 0000) Rubella:   RPR: Nonreactive (08/22 0000)  HBsAg: Negative (04/29 0000)  HIV: Non-reactive (04/29 0000)  GBS: Negative (10/11 0000)   Assessment/Plan: 1. IOL for gestational diabetes at term 2. Admit to LD with return IOL orders 3. GBS negative 4. Anticipate NSVD 5. Chronic Thrombocytopenia  7. Gestational diabetes  Felix Pacini 06/25/2012, 9:24 AM

## 2012-06-25 NOTE — Progress Notes (Signed)
Patient ID: Monique Schmidt, female   DOB: 01-Sep-1985, 26 y.o.   MRN: [id number] Monique Schmidt is a 26 y.o. [id number] at [redacted]w[redacted]d by ultrasound admitted for induction of labor due to Gestational diabetes.  Subjective: Patient is doing well, asking for pain medicine.   Objective: BP 108/62  Pulse 76  Temp 98 F (36.7 C) (Oral)  Resp 20  Ht 5\' 6"  (1.676 m)  Wt 130.636 kg (288 lb)  BMI 46.48 kg/m2  LMP 08/14/2011  Breastfeeding? Unknown      FHT:  FHR: 145's bpm, variability: moderate,  accelerations:  Present,  decelerations:  Absent UC:  Regular every 5 minutes SVE:   Dilation: 2.5 Effacement (%): 70 Station: -3 Exam by:: bp  Labs: Lab Results  Component Value Date   WBC 8.4 06/25/2012   HGB 12.7 06/25/2012   HCT 37.0 06/25/2012   MCV 79.9 06/25/2012   PLT 69* 06/25/2012   BMET    Component Value Date/Time   NA 135 06/25/2012 0800   K 3.3* 06/25/2012 0800   CL 103 06/25/2012 0800   CO2 21 06/25/2012 0800   GLUCOSE 152* 06/25/2012 0800   BUN 8 06/25/2012 0800   CREATININE 0.51 06/25/2012 0800   CALCIUM 8.6 06/25/2012 0800   GFRNONAA >90 06/25/2012 0800   GFRAA >90 06/25/2012 0800    Assessment / Plan: IOL for gestational diabetes. Pitocin started this morning Thrombocytopenia (chronic) Gestational diabetic Labor: pitocin stated, will AROM will able Preeclampsia:  labs stable Fetal Wellbeing:  Category I Pain Control:  IV pain meds available I/D:  n/a Anticipated MOD:  NSVD  Monique Schmidt 06/25/2012, 12:47 PM

## 2012-06-25 NOTE — Progress Notes (Signed)
RN spoke to MD and dicussed POC for pt regarding CBGs. Orders received will continue to monitor.

## 2012-06-25 NOTE — Progress Notes (Signed)
Monique Schmidt is a 26 y.o. [phone] at [redacted]w[redacted]d by ultrasound admitted for induction of labor due to Gestational diabetes.  Subjective: Patient is doing well. She is an uncontrolled diabetic.  Objective: BP 107/59  Pulse 76  Temp 98 F (36.7 C) (Oral)  Resp 20  Ht 5\' 6"  (1.676 m)  Wt 130.636 kg (288 lb)  BMI 46.48 kg/m2  LMP 08/14/2011  Breastfeeding? Unknown      FHT:  FHR: 145's bpm, variability: moderate,  accelerations:  Present,  decelerations:  Absent UC:   irregular, every 5-10 minutes SVE:   Dilation: 2.5 Effacement (%): 50 Station: -3 Exam by:: B.Smith RN  Labs: Lab Results  Component Value Date   WBC 8.4 06/25/2012   HGB 12.7 06/25/2012   HCT 37.0 06/25/2012   MCV 79.9 06/25/2012   PLT 69* 06/25/2012   BMET    Component Value Date/Time   NA 135 06/25/2012 0800   K 3.3* 06/25/2012 0800   CL 103 06/25/2012 0800   CO2 21 06/25/2012 0800   GLUCOSE 152* 06/25/2012 0800   BUN 8 06/25/2012 0800   CREATININE 0.51 06/25/2012 0800   CALCIUM 8.6 06/25/2012 0800   GFRNONAA >90 06/25/2012 0800   GFRAA >90 06/25/2012 0800    Assessment / Plan: IOL for gestational diabetes. Pitocin started Thrombocytopenia (chronic) Gestational diabetic Labor: pitocin stated, will AROM will able Preeclampsia:  labs stable Fetal Wellbeing:  Category I Pain Control:  IV pain meds available I/D:  n/a Anticipated MOD:  NSVD  Kuneff, Renee 06/25/2012, 11:09 AM

## 2012-06-25 NOTE — Progress Notes (Signed)
   Subjective: Pt reports feeling contractions and pressure; requests pain meds.  Objective: BP 113/83  Pulse 69  Temp 98.2 F (36.8 C) (Oral)  Resp 18  Ht 5\' 6"  (1.676 m)  Wt 130.636 kg (288 lb)  BMI 46.48 kg/m2  LMP 08/14/2011  Breastfeeding? Unknown      FHT:  FHR: 130's bpm, variability: moderate,  accelerations:  Present,  decelerations:  Present intermittent variable with return to baseline. UC:   regular, every 2-4 minutes SVE:   Dilation: 3 Effacement (%): 90 Station: -3 Exam by:: Everrett Coombe RN  Labs: Lab Results  Component Value Date   WBC 8.4 06/25/2012   HGB 12.7 06/25/2012   HCT 37.0 06/25/2012   MCV 79.9 06/25/2012   PLT 69* 06/25/2012    Assessment / Plan: Induction of Labor  Labor: Induction of Labor Preeclampsia:  n/a Fetal Wellbeing:  Category I Pain Control:  Nubain  I/D:  n/a Anticipated MOD:  NSVD  [facility] 06/25/2012, 9:46 PM

## 2012-06-25 NOTE — Progress Notes (Signed)
Patient ID: Monique Schmidt, female   DOB: 1986/04/01, 26 y.o.   MRN: [id number] Monique Schmidt is a 26 y.o. [id number] at [redacted]w[redacted]d by ultrasound admitted for induction of labor due to Gestational diabetes.  Subjective: Patient is doing well. Not uncomfortable.   Objective: BP 126/79  Pulse 76  Temp 98.5 F (36.9 C) (Oral)  Resp 18  Ht 5\' 6"  (1.676 m)  Wt 130.636 kg (288 lb)  BMI 46.48 kg/m2  LMP 08/14/2011  Breastfeeding? Unknown      FHT:  FHR: 145's bpm, variability: moderate,  accelerations:  Present,  decelerations:  Absent UC:  Regular every 1-5 minutes SVE:   Dilation: 3 Effacement (%): 80 Station: -2 Exam by:: [employer]: Lab Results  Component Value Date   WBC 8.4 06/25/2012   HGB 12.7 06/25/2012   HCT 37.0 06/25/2012   MCV 79.9 06/25/2012   PLT 69* 06/25/2012   BMET    Component Value Date/Time   NA 135 06/25/2012 0800   K 3.3* 06/25/2012 0800   CL 103 06/25/2012 0800   CO2 21 06/25/2012 0800   GLUCOSE 152* 06/25/2012 0800   BUN 8 06/25/2012 0800   CREATININE 0.51 06/25/2012 0800   CALCIUM 8.6 06/25/2012 0800   GFRNONAA >90 06/25/2012 0800   GFRAA >90 06/25/2012 0800    Assessment / Plan: IOL for gestational diabetes. Pitocin started this morning Thrombocytopenia (chronic) Gestational diabetic Labor: pitocin, AROM Preeclampsia:  labs stable Fetal Wellbeing:  Category I Pain Control:  IV pain meds available I/D:  n/a Anticipated MOD:  NSVD  Kuneff, Renee 06/25/2012, 4:38 PM

## 2012-06-25 NOTE — Progress Notes (Signed)
1356 to now spent setting up [facility]

## 2012-06-25 NOTE — Progress Notes (Signed)
Patient ID: Monique Schmidt, female   DOB: 1985/12/23, 26 y.o.   MRN: [id number] Monique Schmidt is a 26 y.o. [id number] at [redacted]w[redacted]d by ultrasound admitted for induction of labor due to Gestational diabetes.  Subjective: Patient is doing well. Feeling contractions, but they are mild.    Objective: BP 126/79  Pulse 76  Temp 98.5 F (36.9 C) (Oral)  Resp 18  Ht 5\' 6"  (1.676 m)  Wt 130.636 kg (288 lb)  BMI 46.48 kg/m2  LMP 08/14/2011  Breastfeeding? Unknown      FHT:  FHR: 145's bpm, variability: moderate,  accelerations:  Present,  decelerations:  Absent UC:  Regular every 1-2 minutes SVE:   Dilation: 3 Effacement (%): 80 Station: -3 Exam by:: Ferne Coe RN  Labs: Lab Results  Component Value Date   WBC 8.4 06/25/2012   HGB 12.7 06/25/2012   HCT 37.0 06/25/2012   MCV 79.9 06/25/2012   PLT 69* 06/25/2012   BMET    Component Value Date/Time   NA 135 06/25/2012 0800   K 3.3* 06/25/2012 0800   CL 103 06/25/2012 0800   CO2 21 06/25/2012 0800   GLUCOSE 152* 06/25/2012 0800   BUN 8 06/25/2012 0800   CREATININE 0.51 06/25/2012 0800   CALCIUM 8.6 06/25/2012 0800   GFRNONAA >90 06/25/2012 0800   GFRAA >90 06/25/2012 0800    Assessment / Plan: IOL for gestational diabetes. Pitocin started this morning Thrombocytopenia (chronic) Gestational diabetic Labor: pitocin, relatively no cervical change, attempt to AROM when capable - Considering Pitocin break with cytotec overnight Preeclampsia:  labs stable Fetal Wellbeing:  Category I Pain Control:  IV pain meds available I/D:  n/a Anticipated MOD:  NSVD  Monique Schmidt 06/25/2012, 6:54 PM

## 2012-06-26 ENCOUNTER — Encounter (HOSPITAL_COMMUNITY): Payer: Self-pay

## 2012-06-26 DIAGNOSIS — O2390 Unspecified genitourinary tract infection in pregnancy, unspecified trimester: Secondary | ICD-10-CM

## 2012-06-26 DIAGNOSIS — D696 Thrombocytopenia, unspecified: Secondary | ICD-10-CM

## 2012-06-26 DIAGNOSIS — O9912 Other diseases of the blood and blood-forming organs and certain disorders involving the immune mechanism complicating childbirth: Secondary | ICD-10-CM

## 2012-06-26 DIAGNOSIS — D689 Coagulation defect, unspecified: Secondary | ICD-10-CM

## 2012-06-26 DIAGNOSIS — O99814 Abnormal glucose complicating childbirth: Secondary | ICD-10-CM

## 2012-06-26 MED ORDER — SENNOSIDES-DOCUSATE SODIUM 8.6-50 MG PO TABS
2.0000 | ORAL_TABLET | Freq: Every day | ORAL | Status: DC
  Administered 2012-06-26 – 2012-06-27 (×2): 2 via ORAL

## 2012-06-26 MED ORDER — ONDANSETRON HCL 4 MG/2ML IJ SOLN: 4.0000 mg | INTRAMUSCULAR | Status: DC | PRN

## 2012-06-26 MED ORDER — ZOLPIDEM TARTRATE 5 MG PO TABS: 5.0000 mg | ORAL_TABLET | Freq: Every evening | ORAL | Status: DC | PRN

## 2012-06-26 MED ORDER — TETANUS-DIPHTH-ACELL PERTUSSIS 5-2.5-18.5 LF-MCG/0.5 IM SUSP
0.5000 mL | Freq: Once | INTRAMUSCULAR | Status: AC
  Administered 2012-06-27: 0.5 mL via INTRAMUSCULAR
  Filled 2012-06-26: qty 0.5

## 2012-06-26 MED ORDER — METRONIDAZOLE 500 MG PO TABS
500.0000 mg | ORAL_TABLET | Freq: Two times a day (BID) | ORAL | Status: DC
  Administered 2012-06-26 – 2012-06-28 (×5): 500 mg via ORAL
  Filled 2012-06-26 (×7): qty 1

## 2012-06-26 MED ORDER — PRENATAL MULTIVITAMIN CH: 1.0000 | ORAL_TABLET | Freq: Every day | ORAL | Status: DC

## 2012-06-26 MED ORDER — SIMETHICONE 80 MG PO CHEW: 80.0000 mg | CHEWABLE_TABLET | ORAL | Status: DC | PRN

## 2012-06-26 MED ORDER — ONDANSETRON HCL 4 MG PO TABS: 4.0000 mg | ORAL_TABLET | ORAL | Status: DC | PRN

## 2012-06-26 MED ORDER — WITCH HAZEL-GLYCERIN EX PADS: 1.0000 "application " | MEDICATED_PAD | CUTANEOUS | Status: DC | PRN

## 2012-06-26 MED ORDER — DIBUCAINE 1 % RE OINT: 1.0000 "application " | TOPICAL_OINTMENT | RECTAL | Status: DC | PRN

## 2012-06-26 MED ORDER — IBUPROFEN 600 MG PO TABS
600.0000 mg | ORAL_TABLET | Freq: Four times a day (QID) | ORAL | Status: DC
  Administered 2012-06-26 – 2012-06-28 (×9): 600 mg via ORAL
  Filled 2012-06-26 (×9): qty 1

## 2012-06-26 MED ORDER — OXYCODONE-ACETAMINOPHEN 5-325 MG PO TABS
1.0000 | ORAL_TABLET | ORAL | Status: DC | PRN
  Administered 2012-06-26 – 2012-06-28 (×7): 1 via ORAL
  Administered 2012-06-28: 2 via ORAL
  Filled 2012-06-26 (×7): qty 1
  Filled 2012-06-26: qty 2

## 2012-06-26 MED ORDER — DIPHENHYDRAMINE HCL 25 MG PO CAPS
25.0000 mg | ORAL_CAPSULE | Freq: Four times a day (QID) | ORAL | Status: DC | PRN
  Administered 2012-06-27 – 2012-06-28 (×2): 25 mg via ORAL
  Filled 2012-06-26 (×2): qty 1

## 2012-06-26 MED ORDER — BENZOCAINE-MENTHOL 20-0.5 % EX AERO
1.0000 "application " | INHALATION_SPRAY | CUTANEOUS | Status: DC | PRN
  Filled 2012-06-26: qty 56

## 2012-06-26 MED ORDER — LANOLIN HYDROUS EX OINT: TOPICAL_OINTMENT | CUTANEOUS | Status: DC | PRN

## 2012-06-26 MED ORDER — PRENATAL MULTIVITAMIN CH
1.0000 | ORAL_TABLET | Freq: Every day | ORAL | Status: DC
  Administered 2012-06-26 – 2012-06-28 (×3): 1 via ORAL
  Filled 2012-06-26 (×3): qty 1

## 2012-06-26 NOTE — Progress Notes (Signed)
UR Chart review completed.  

## 2012-06-26 NOTE — Progress Notes (Signed)
   Subjective: Pt reports continued contractions.  Objective: BP 133/86  Pulse 69  Temp 98.3 F (36.8 C) (Oral)  Resp 18  Ht 5\' 6"  (1.676 m)  Wt 130.636 kg (288 lb)  BMI 46.48 kg/m2  LMP 08/14/2011  Breastfeeding? Unknown      FHT:  FHR: 130's bpm, variability: moderate,  accelerations:  Present,  decelerations:  Absent UC:   regular, every 2-3 minutes SVE:   Dilation: 4.5 Effacement (%): 90 Station: -3 Exam by:: Muhammad CNM  AROM > clear fluid  Labs: Lab Results  Component Value Date   WBC 8.4 06/25/2012   HGB 12.7 06/25/2012   HCT 37.0 06/25/2012   MCV 79.9 06/25/2012   PLT 69* 06/25/2012   CBG (last 3)   Basename 06/26/12 0414 06/26/12 0008 06/25/12 2001  GLUCAP 112* 110* 90      Assessment / Plan: Induction of Labor - Slow Progression  Labor: Induction of Labor - Slow Progression Preeclampsia:  n/a Fetal Wellbeing:  Category I Pain Control:  IV Pain Meds I/D:  n/a Anticipated MOD:  NSVD  [facility] 06/26/2012, 4:42 AM

## 2012-06-27 LAB — CBC
MCH: 26.8 pg (ref 26.0–34.0)
MCHC: 33.3 g/dL (ref 30.0–36.0)
MCV: 80.5 fL (ref 78.0–100.0)
Platelets: 82 10*3/uL — ABNORMAL LOW (ref 150–400)
RBC: 4.47 MIL/uL (ref 3.87–5.11)

## 2012-06-27 NOTE — Discharge Summary (Signed)
Obstetric Discharge Summary Reason for Admission: Induction of Labor due to uncontrolled gestational diabetes Prenatal Procedures: none Intrapartum Procedures: spontaneous vaginal delivery Postpartum Procedures: none Complications-Operative and Postpartum: none  Pt states she is doing well. Denies significant pain, states the medication she is receiving here is controlling the pain well. Reports minimal bleeding, only spotting and has not soaked through any pads. States she feels cramps in her uterus, especially when she is breast feeding. States that her milk has come in and the baby is latching, but she would like to talk to lactation consultant again to get more assistance before she goes home. Is tolerating PO well, is up ad lib. Has not had a bowel movement since delivery but was given a stool softener today. Does not feel any pain from constipation. Pt plans for 6-week postpartum follow-up at the [facility]. Plans for Nexplanon for contraception. Denies headache, chest pain, shortness of breath, nausea, vomiting. Pt has history of chronic thrombocytopenia. Platelets are 82 today, which is improved from previous measurement of 69.  Hemoglobin  Date Value Range Status  06/27/2012 12.0  12.0 - 15.0 g/dL Final  1/61/0960 45.4   Final     HCT  Date Value Range Status  06/27/2012 36.0  36.0 - 46.0 % Final  12/17/2011 39   Final   Results for orders placed during the hospital encounter of 06/25/12 (from the past 24 hour(s))  CBC     Status: Abnormal   Collection Time   06/27/12  5:38 AM      Component Value Range   WBC 9.1  4.0 - 10.5 K/uL   RBC 4.47  3.87 - 5.11 MIL/uL   Hemoglobin 12.0  12.0 - 15.0 g/dL   HCT 09.8  11.9 - 14.7 %   MCV 80.5  78.0 - 100.0 fL   MCH 26.8  26.0 - 34.0 pg   MCHC 33.3  30.0 - 36.0 g/dL   RDW 82.9 (*) 56.2 - 13.0 %   Platelets 82 (*) 150 - 400 K/uL     Physical Exam:  General: alert, cooperative and no distress Lochia: appropriate Uterine Fundus: firm DVT  Evaluation: No evidence of DVT seen on physical exam. Negative Homan's sign. No significant calf/ankle edema.  Discharge Diagnoses: Term Pregnancy-delivered  Discharge Information: Date: 06/27/2012 Activity: pelvic rest Diet: routine Medications: Ibuprofen Condition: stable Discharge to: home   Newborn Data: Live born female  Birth Weight: 7 lb 2 oz (3232 g) APGAR: 8, 9  Home with mother.  Monique Schmidt 06/27/2012, 7:33 AM

## 2012-06-28 MED ORDER — IBUPROFEN 600 MG PO TABS: 600.0000 mg | ORAL_TABLET | Freq: Four times a day (QID) | ORAL | Status: DC

## 2012-06-28 NOTE — Discharge Summary (Signed)
I saw and examined patient and agree with above. Patient does not feel ready to go home yet. Will stay until tomorrow. Napoleon Form, MD

## 2012-06-28 NOTE — Discharge Summary (Signed)
Obstetric Discharge Summary Reason for Admission: induction of labor for uncontrolled gestational diabetes Prenatal Procedures: none Intrapartum Procedures: spontaneous vaginal delivery Postpartum Procedures: none Complications-Operative and Postpartum: none  Monique Schmidt is a 26 y.o. female [phone] [redacted]w[redacted]d presenting for IOL d/t uncontrolled gestational diabetes. She had followed with Dr. Gaynell Face for part of her pregnancy and appeared to be non-compliant with her diabetes regimen. She presented to the [facility] earlier this week for treatment and was scheduled for induction. She was placed on Glyburide for the interm time, and asked to hold it for today. She also had been taking flagyl for BV since Monday. She has a history of thrombocytopenia. She has delivered two babies by SVD.  Hemoglobin  Date Value Range Status  06/27/2012 12.0  12.0 - 15.0 g/dL Final  4/54/0981 19.1   Final     HCT  Date Value Range Status  06/27/2012 36.0  36.0 - 46.0 % Final  12/17/2011 39   Final    Physical Exam:  General: alert, cooperative and no distress Lochia: appropriate Uterine Fundus: firm IDVT Evaluation: No evidence of DVT seen on physical exam.  Discharge Diagnoses: Term Pregnancy-delivered; GDM- uncontrolled  Discharge Information: Date: 06/28/2012 Activity: pelvic rest Diet: routine Medications: PNV and Ibuprofen Condition: stable Instructions: refer to practice specific booklet Discharge to: home Follow-up Information    Call [facility]. (Call to make appointment for birth control and 6-week postpartum follow-up)    Contact information:   [facility] [address] [place] [address] [phone]         Newborn Data: Live born female  Birth Weight: 7 lb 2 oz (3232 g) APGAR: 8, 9  Home with mother. Breast and bottlefeeding; desires Nexplanon for contraception.  [name] 06/28/2012, 7:16 AM

## 2012-06-29 LAB — TYPE AND SCREEN: Antibody Screen: NEGATIVE

## 2012-07-14 ENCOUNTER — Telehealth: Payer: Self-pay | Admitting: Obstetrics and Gynecology

## 2012-07-14 NOTE — Telephone Encounter (Signed)
Patient called with c/o boils under her arm; states she's had this before but does not have primary care provider anymore. Advised patient of visiting [facility]--Gave information. Patient agrees and satisfied.

## 2012-07-30 ENCOUNTER — Ambulatory Visit: Payer: Medicaid Other | Admitting: Obstetrics and Gynecology

## 2012-08-01 ENCOUNTER — Ambulatory Visit: Payer: Medicaid Other | Admitting: Obstetrics & Gynecology

## 2012-09-09 ENCOUNTER — Telehealth: Payer: Self-pay | Admitting: *Deleted

## 2012-09-09 ENCOUNTER — Encounter: Payer: Self-pay | Admitting: *Deleted

## 2012-09-09 NOTE — Telephone Encounter (Addendum)
Pt left message which was extremely difficult to comprehend- very muffled.  She said something about needing a note for maternity leave. 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 It needs to state that she went out on maternity leave on 06/26/12 due to birth of child and that she has not returned to work because of missed clinic appts. Her post partum appt is on 09/22/12 @ 3pm.  She asked that the letter be faxed to # [phone]. I told pt that I will compose letter and fax as requested.  Letter was faxed and confirmation of receipt was received.

## 2012-09-22 ENCOUNTER — Ambulatory Visit: Payer: Medicaid Other | Admitting: Obstetrics and Gynecology

## 2012-10-23 ENCOUNTER — Other Ambulatory Visit: Payer: Self-pay | Admitting: Medical

## 2012-10-23 ENCOUNTER — Encounter: Payer: Self-pay | Admitting: Medical

## 2012-10-23 ENCOUNTER — Ambulatory Visit (INDEPENDENT_AMBULATORY_CARE_PROVIDER_SITE_OTHER): Payer: Medicaid Other | Admitting: Medical

## 2012-10-23 VITALS — BP 122/81 | HR 76 | Ht 65.0 in | Wt 290.3 lb

## 2012-10-23 DIAGNOSIS — Z3009 Encounter for other general counseling and advice on contraception: Secondary | ICD-10-CM

## 2012-10-23 LAB — POCT PREGNANCY, URINE: Preg Test, Ur: NEGATIVE

## 2012-10-23 LAB — POCT URINALYSIS DIP (DEVICE)
Hgb urine dipstick: NEGATIVE
Protein, ur: NEGATIVE mg/dL
Specific Gravity, Urine: >=1.03 (ref 1.005–1.030)
Urobilinogen, UA: 0.2 mg/dL (ref 0.0–1.0)

## 2012-10-23 MED ORDER — NORGESTIMATE-ETH ESTRADIOL 0.25-35 MG-MCG PO TABS: 1.0000 | ORAL_TABLET | Freq: Every day | ORAL | Status: DC

## 2012-10-23 NOTE — Progress Notes (Signed)
Patient ID: Monique Schmidt, female   DOB: 1985-12-30, 27 y.o.   MRN: [id number] Subjective:     Monique Schmidt is a 27 y.o. female who presents for a postpartum visit. She is 4 months postpartum following a spontaneous vaginal delivery. I have fully reviewed the prenatal and intrapartum course. The delivery was at 39 gestational weeks. Outcome: spontaneous vaginal delivery. Anesthesia: IV sedation. Postpartum course has been normal. Baby's course has been normal. Baby is feeding by bottle - patient unsure. Bleeding regular periods. Bowel function is normal. Bladder function is normal. Patient is sexually active. Contraception method is none. Postpartum depression screening: negative. The patient is complaining of intermittent mid to lower back pain. The patient feels that this is due to the large size of her breasts. She has been evaluated for this previously and was told she would have to lose weight before she would be considered a surgical candidate. The patient has been given contact information for [facility] in the past to establish primary care.   The following portions of the patient's history were reviewed and updated as appropriate: allergies, current medications, past family history, past medical history, past social history, past surgical history and problem list.  Review of Systems Pertinent items are noted in HPI.   Objective:    BP 122/81  Pulse 76  Ht 5\' 5"  (1.651 m)  Wt 290 lb 4.8 oz (131.679 kg)  BMI 48.31 kg/m2  LMP 09/26/2012  Breastfeeding? No  General:  alert and cooperative   Breasts:  not performed  Lungs: clear to auscultation bilaterally  Heart:  regular rate and rhythm, S1, S2 normal, no murmur, click, rub or gallop  Abdomen: soft, non-tender; bowel sounds normal; no masses,  no organomegaly   Vulva:  normal  Vagina: normal vagina, no discharge, exudate, lesion, or erythema  Cervix:  clean, normal discharge, no bleeding  Corpus: normal size, contour, position,  consistency, mobility, non-tender  Adnexa:  normal adnexa and no mass, fullness, tenderness  Rectal Exam: Not performed.         Assessment:     Normal postpartum exam. Pap smear not done at today's visit.   Plan:    1. Contraception: OCP (estrogen/progesterone) 2. Rx sent to patient's pharmacy. UPT negative today. Patient instructed to start pills the Sunday after her next period.  3. Patient instructed to contact [facility] to establish primary care and discuss her back pain. May take Ibuprofen for the discomfort as needed until then.  4. Follow up in: 1 year for annual exam or sooner as needed

## 2012-10-23 NOTE — Patient Instructions (Signed)

## 2012-11-10 ENCOUNTER — Emergency Department (HOSPITAL_COMMUNITY)
Admission: EM | Admit: 2012-11-10 | Discharge: 2012-11-10 | Disposition: A | Discharge status: Home / Self Care | Payer: Medicaid Other | Attending: Emergency Medicine | Admitting: Emergency Medicine

## 2012-11-10 ENCOUNTER — Emergency Department (HOSPITAL_COMMUNITY): Payer: Medicaid Other

## 2012-11-10 ENCOUNTER — Encounter (HOSPITAL_COMMUNITY): Payer: Self-pay | Admitting: Nurse Practitioner

## 2012-11-10 DIAGNOSIS — W010XXA Fall on same level from slipping, tripping and stumbling without subsequent striking against object, initial encounter: Secondary | ICD-10-CM | POA: Insufficient documentation

## 2012-11-10 DIAGNOSIS — Y9289 Other specified places as the place of occurrence of the external cause: Secondary | ICD-10-CM | POA: Insufficient documentation

## 2012-11-10 DIAGNOSIS — X500XXA Overexertion from strenuous movement or load, initial encounter: Secondary | ICD-10-CM | POA: Insufficient documentation

## 2012-11-10 DIAGNOSIS — Z862 Personal history of diseases of the blood and blood-forming organs and certain disorders involving the immune mechanism: Secondary | ICD-10-CM | POA: Insufficient documentation

## 2012-11-10 DIAGNOSIS — S93401A Sprain of unspecified ligament of right ankle, initial encounter: Secondary | ICD-10-CM

## 2012-11-10 DIAGNOSIS — Z79899 Other long term (current) drug therapy: Secondary | ICD-10-CM | POA: Insufficient documentation

## 2012-11-10 DIAGNOSIS — S93409A Sprain of unspecified ligament of unspecified ankle, initial encounter: Secondary | ICD-10-CM | POA: Insufficient documentation

## 2012-11-10 DIAGNOSIS — Z8632 Personal history of gestational diabetes: Secondary | ICD-10-CM | POA: Insufficient documentation

## 2012-11-10 DIAGNOSIS — Y939 Activity, unspecified: Secondary | ICD-10-CM | POA: Insufficient documentation

## 2012-11-10 MED ORDER — IBUPROFEN 400 MG PO TABS
800.0000 mg | ORAL_TABLET | Freq: Once | ORAL | Status: AC
  Administered 2012-11-10: 800 mg via ORAL
  Filled 2012-11-10: qty 2

## 2012-11-10 MED ORDER — IBUPROFEN 800 MG PO TABS: 800.0000 mg | ORAL_TABLET | Freq: Three times a day (TID) | ORAL | Status: DC

## 2012-11-10 NOTE — Progress Notes (Signed)
Orthopedic Tech Progress Note Patient Details:  Monique Schmidt 1986-02-14 [id number]  Ortho Devices Type of Ortho Device: ASO;Crutches Ortho Device/Splint Location: right ankle Ortho Device/Splint Interventions: Application   Nikki Dom 11/10/2012, 9:31 PM

## 2012-11-10 NOTE — ED Notes (Addendum)
Per ems: pt called for R ankle pain after falling on curb today. Denies any other injuries or complaints. No obvious injuries. Cms intact, ambulatory

## 2012-11-10 NOTE — ED Provider Notes (Signed)
History     CSN: [id number]  Arrival date & time 11/10/12  1647   First MD Initiated Contact with Patient 11/10/12 2024      Chief Complaint  Patient presents with  . Ankle Pain    (Consider location/radiation/quality/duration/timing/severity/associated sxs/prior treatment) HPI Comments: Patient is a 27 year old female who presents with right ankle pain that started earlier today after she tripped on a curb. The mechanism of injury was sudden ankle inversion. Patient reports hearing a "pop" sudden onset of throbbing, severe pain that is localized to right ankle. Patient reports progressive worsening of pain. Ankle movement and weight bearing activity make the pain worse. Nothing makes the pain better. Patient reports associated swelling. Patient has not tried anything for pain relief. Patient denies obvious deformity, numbness/tingling, coolness/weakness of extremity, bruising, and any other injury.     Patient is a 27 y.o. female presenting with ankle pain.  Ankle Pain   Past Medical History  Diagnosis Date  . Abnormal Pap smear   . Gestational diabetes     all pregnancies glyburide started 11/4  . Thrombocytopenia     Past Surgical History  Procedure Laterality Date  . No past surgeries      Family History  Problem Relation Age of Onset  . Hypertension Father   . Diabetes Father   . Diabetes Mother   . Seizures Brother     History  Substance Use Topics  . Smoking status: Never Smoker   . Smokeless tobacco: Never Used  . Alcohol Use: No    OB History   Grav Para Term Preterm Abortions TAB SAB Ect Mult Living   4 3 3  1  1   3       Review of Systems  Musculoskeletal: Positive for arthralgias.  All other systems reviewed and are negative.    Allergies  Latex  Home Medications   Current Outpatient Rx  Name  Route  Sig  Dispense  Refill  . norgestimate-ethinyl estradiol (ORTHO-CYCLEN,SPRINTEC,PREVIFEM) 0.25-35 MG-MCG tablet   Oral   Take 1 tablet  by mouth daily.   1 Package   11     BP 121/89  Pulse 74  Temp(Src) 97.7 F (36.5 C) (Oral)  Resp 20  SpO2 98%  LMP 09/26/2012  Breastfeeding? Unknown  Physical Exam  Nursing note and vitals reviewed. Constitutional: She is oriented to person, place, and time. She appears well-developed and well-nourished. No distress.  HENT:  Head: Normocephalic and atraumatic.  Eyes: Conjunctivae are normal.  Neck: Normal range of motion. Neck supple.  Cardiovascular: Normal rate and regular rhythm.  Exam reveals no gallop and no friction rub.   No murmur heard. Pulmonary/Chest: Effort normal and breath sounds normal. She has no wheezes. She has no rales. She exhibits no tenderness.  Abdominal: Soft. There is no tenderness.  Musculoskeletal:  Right ankle ROM limited due to pain. Mild edema. No obvious deformity. Tenderness to palpation of medical and lateral malleolus.   Neurological: She is alert and oriented to person, place, and time. Coordination normal.  Speech is goal-oriented. Moves limbs without ataxia.   Skin: Skin is warm and dry.  Psychiatric: She has a normal mood and affect. Her behavior is normal.    ED Course  Procedures (including critical care time)  Labs Reviewed - No data to display Dg Ankle Complete Right  11/10/2012  *RADIOLOGY REPORT*  Clinical Data: The right ankle pain and swelling secondary to a twisting fall today.  RIGHT ANKLE -  COMPLETE 3+ VIEW  Comparison: None.  Findings: There is no fracture, dislocation, or appreciable joint effusion.  IMPRESSION: Normal exam.   Original Report Authenticated By: Francene Boyers, M.D.      1. Right ankle sprain, initial encounter       MDM  8:33 PM Xray shows no fracture. Patient will have ASo ankle brace, crutches, and pain medication. No signs of neurovascular compromise. No further evaluation needed at this time. Patient instructed to return with worsening or concerning symptoms.        Emilia Beck,  PA-C 11/10/12 2235

## 2012-11-11 NOTE — ED Provider Notes (Signed)
Medical screening examination/treatment/procedure(s) were performed by non-physician practitioner and as supervising physician I was immediately available for consultation/collaboration.  Flint Melter, MD 11/11/12 [phone]

## 2013-01-02 ENCOUNTER — Encounter (HOSPITAL_COMMUNITY): Payer: Self-pay

## 2013-01-02 ENCOUNTER — Inpatient Hospital Stay (HOSPITAL_COMMUNITY)
Admission: AD | Admit: 2013-01-02 | Discharge: 2013-01-02 | Disposition: A | Discharge status: Home / Self Care | Payer: Medicaid Other | Source: Ambulatory Visit | Attending: Obstetrics & Gynecology | Admitting: Obstetrics & Gynecology

## 2013-01-02 DIAGNOSIS — M545 Low back pain, unspecified: Secondary | ICD-10-CM | POA: Insufficient documentation

## 2013-01-02 DIAGNOSIS — N949 Unspecified condition associated with female genital organs and menstrual cycle: Secondary | ICD-10-CM | POA: Insufficient documentation

## 2013-01-02 DIAGNOSIS — R102 Pelvic and perineal pain: Secondary | ICD-10-CM

## 2013-01-02 DIAGNOSIS — M549 Dorsalgia, unspecified: Secondary | ICD-10-CM

## 2013-01-02 DIAGNOSIS — N912 Amenorrhea, unspecified: Secondary | ICD-10-CM

## 2013-01-02 LAB — CBC
HCT: 38.7 % (ref 36.0–46.0)
Hemoglobin: 13.1 g/dL (ref 12.0–15.0)
MCH: 27.7 pg (ref 26.0–34.0)
MCHC: 33.9 g/dL (ref 30.0–36.0)
RBC: 4.73 MIL/uL (ref 3.87–5.11)

## 2013-01-02 LAB — WET PREP, GENITAL
Clue Cells Wet Prep HPF POC: NONE SEEN
Trich, Wet Prep: NONE SEEN

## 2013-01-02 LAB — URINALYSIS, ROUTINE W REFLEX MICROSCOPIC
Glucose, UA: NEGATIVE mg/dL
Leukocytes, UA: NEGATIVE
Nitrite: NEGATIVE
Specific Gravity, Urine: >1.03 — ABNORMAL HIGH (ref 1.005–1.030)
pH: 6 (ref 5.0–8.0)

## 2013-01-02 LAB — POCT PREGNANCY, URINE: Preg Test, Ur: NEGATIVE

## 2013-01-02 MED ORDER — IBUPROFEN 600 MG PO TABS: 600.0000 mg | ORAL_TABLET | Freq: Four times a day (QID) | ORAL | Status: DC | PRN

## 2013-01-02 NOTE — MAU Note (Signed)
Pt states has had back pain since January and no menstrual cycle since December. Has not taken pregnancy test. Did note spotting three days ago, however none today. Denies uti s/s.

## 2013-01-02 NOTE — Discharge Instructions (Signed)
Back Exercises Back exercises help treat and prevent back injuries. The goal is to increase your strength in your belly (abdominal) and back muscles. These exercises can also help with flexibility. Start these exercises when told by your doctor. HOME CARE Back exercises include: Pelvic Tilt.  Lie on your back with your knees bent. Tilt your pelvis until the lower part of your back is against the floor. Hold this position 5 to 10 sec. Repeat this exercise 5 to 10 times. Knee to Chest.  Pull 1 knee up against your chest and hold for 20 to 30 seconds. Repeat this with the other knee. This may be done with the other leg straight or bent, whichever feels better. Then, pull both knees up against your chest. Sit-Ups or Curl-Ups.  Bend your knees 90 degrees. Start with tilting your pelvis, and do a partial, slow sit-up. Only lift your upper half 30 to 45 degrees off the floor. Take at least 2 to 3 seonds for each sit-up. Do not do sit-ups with your knees out straight. If partial sit-ups are difficult, simply do the above but with only tightening your belly (abdominal) muscles and holding it as told. Hip-Lift.  Lie on your back with your knees flexed 90 degrees. Push down with your feet and shoulders as you raise your hips 2 inches off the floor. Hold for 10 seconds, repeat 5 to 10 times. Back Arches.  Lie on your stomach. Prop yourself up on bent elbows. Slowly press on your hands, causing an arch in your low back. Repeat 3 to 5 times. Shoulder-Lifts.  Lie face down with arms beside your body. Keep hips and belly pressed to floor as you slowly lift your head and shoulders off the floor. Do not overdo your exercises. Be careful in the beginning. Exercises may cause you some mild back discomfort. If the pain lasts for more than 15 minutes, stop the exercises until you see your doctor. Improvement with exercise for back problems is slow.  Document Released: 09/08/2010 Document Revised: 10/29/2011  Document Reviewed: 06/07/2011 Exit[employer] Patient Information 2013 [employer].  

## 2013-01-02 NOTE — MAU Provider Note (Signed)
History     CSN: [id number]  Arrival date and time: 01/02/13 1510   First Provider Initiated Contact with Patient 01/02/13 1610      Chief Complaint  Patient presents with  . Back Pain  . Possible Pregnancy   HPI [name] is a 27 y.o. [phone] who presents to MAU today with complaint of pelvic pain, low back pain and amenorrhea. The patient states that she hasn't had a period since December. She delivered her last child in late November and was breast feeding x 1 month. She stopped, but states that she is still "leaking milk." She had some spotting last month, but denies other bleeding. She denies vaginal discharge, UTI symptoms or constipation. She has had nausea without vomiting and occasional loose stools. She rates her back and pelvic pain at 8/10 now. She has not taken any pain medication. She states that she has been tired and sometimes feels tachycardic.   OB History   Grav Para Term Preterm Abortions TAB SAB Ect Mult Living   4 3 3  1  1   3       Past Medical History  Diagnosis Date  . Abnormal Pap smear   . Gestational diabetes     all pregnancies glyburide started 11/4  . Thrombocytopenia     Past Surgical History  Procedure Laterality Date  . No past surgeries      Family History  Problem Relation Age of Onset  . Hypertension Father   . Diabetes Father   . Diabetes Mother   . Seizures Brother     History  Substance Use Topics  . Smoking status: Never Smoker   . Smokeless tobacco: Never Used  . Alcohol Use: No    Allergies:  Allergies  Allergen Reactions  . Latex Itching and Rash    Prescriptions prior to admission  Medication Sig Dispense Refill  . amoxicillin (AMOXIL) 500 MG capsule Take 500 mg by mouth 3 (three) times daily.      . [DISCONTINUED] ibuprofen (ADVIL,MOTRIN) 800 MG tablet Take 1 tablet (800 mg total) by mouth 3 (three) times daily.  21 tablet  0  . [DISCONTINUED] norgestimate-ethinyl estradiol  (ORTHO-CYCLEN,SPRINTEC,PREVIFEM) 0.25-35 MG-MCG tablet Take 1 tablet by mouth daily.  1 Package  11    Review of Systems  Constitutional: Negative for fever and malaise/fatigue.  Gastrointestinal: Positive for nausea, abdominal pain and diarrhea. Negative for vomiting and constipation.  Genitourinary: Negative for dysuria, urgency and frequency.       Neg - vaginal bleeding, discharge  Musculoskeletal: Positive for back pain.   Physical Exam   Blood pressure 119/78, pulse 78, temperature 97.4 F (36.3 C), temperature source Oral, resp. rate 18, height 5\' 5"  (1.651 m), weight 297 lb 8 oz (134.945 kg), not currently breastfeeding.  Physical Exam  Constitutional: She is oriented to person, place, and time. She appears well-developed and well-nourished. No distress.  HENT:  Head: Normocephalic and atraumatic.  Cardiovascular: Normal rate, regular rhythm and normal heart sounds.   Respiratory: Effort normal and breath sounds normal. No respiratory distress.  GI: Soft. Bowel sounds are normal. She exhibits no distension. There is tenderness (very mild diffuse tenderness to palpation of the abdomen).  Genitourinary: Uterus is not enlarged and not tender. Cervix exhibits no motion tenderness, no discharge and no friability. Right adnexum displays tenderness. Right adnexum displays no mass. Left adnexum displays no mass and no tenderness. Vaginal discharge (scant thin white discharge noted) found.  Musculoskeletal: She exhibits  no edema and no tenderness.       Lumbar back: She exhibits pain. She exhibits no tenderness, no bony tenderness, no swelling and no edema.  Neurological: She is alert and oriented to person, place, and time.  Skin: Skin is warm and dry. No erythema.  Psychiatric: She has a normal mood and affect.   Results for orders placed during the hospital encounter of 01/02/13 (from the past 24 hour(s))  URINALYSIS, ROUTINE W REFLEX MICROSCOPIC     Status: Abnormal   Collection  Time    01/02/13  4:07 PM      Result Value Range   Color, Urine YELLOW  YELLOW   APPearance CLEAR  CLEAR   Specific Gravity, Urine >1.030 (*) 1.005 - 1.030   pH 6.0  5.0 - 8.0   Glucose, UA NEGATIVE  NEGATIVE mg/dL   Hgb urine dipstick NEGATIVE  NEGATIVE   Bilirubin Urine NEGATIVE  NEGATIVE   Ketones, ur NEGATIVE  NEGATIVE mg/dL   Protein, ur NEGATIVE  NEGATIVE mg/dL   Urobilinogen, UA 0.2  0.0 - 1.0 mg/dL   Nitrite NEGATIVE  NEGATIVE   Leukocytes, UA NEGATIVE  NEGATIVE  POCT PREGNANCY, URINE     Status: None   Collection Time    01/02/13  4:07 PM      Result Value Range   Preg Test, Ur NEGATIVE  NEGATIVE  WET PREP, GENITAL     Status: Abnormal   Collection Time    01/02/13  4:21 PM      Result Value Range   Yeast Wet Prep HPF POC NONE SEEN  NONE SEEN   Trich, Wet Prep NONE SEEN  NONE SEEN   Clue Cells Wet Prep HPF POC NONE SEEN  NONE SEEN   WBC, Wet Prep HPF POC FEW (*) NONE SEEN  HCG, SERUM, QUALITATIVE     Status: None   Collection Time    01/02/13  4:27 PM      Result Value Range   Preg, Serum NEGATIVE  NEGATIVE  CBC     Status: Abnormal   Collection Time    01/02/13  4:27 PM      Result Value Range   WBC 8.1  4.0 - 10.5 K/uL   RBC 4.73  3.87 - 5.11 MIL/uL   Hemoglobin 13.1  12.0 - 15.0 g/dL   HCT 40.9  81.1 - 91.4 %   MCV 81.8  78.0 - 100.0 fL   MCH 27.7  26.0 - 34.0 pg   MCHC 33.9  30.0 - 36.0 g/dL   RDW 78.2  95.6 - 21.3 %   Platelets 144 (*) 150 - 400 K/uL    MAU Course  Procedures None  MDM UA, UPT, Wet prep, GC/Chlamydia, CBC and qualitative hCG today  Assessment and Plan  A: Low back pain Pelvic pain Amenorrhea  P: Discharge home Patient advised to follow-up with [facility] clinic for further evaluation of pelvic pain after [place]. Order placed for [place] for next week.  Patient advised to call [facility] to establish primary care for back pain if persists Patient AVS contains information about back pain Discussed with patient that continuing to produce milk is  most likely reason for amenorrhea, since she is no longer breast feeding, she may start OCPs today.  Patient may return to MAU as needed or if her condition were to change or worsen  Freddi Starr, PA-C  01/02/2013, 5:28 PM

## 2013-01-03 LAB — GC/CHLAMYDIA PROBE AMP
CT Probe RNA: NEGATIVE
GC Probe RNA: NEGATIVE

## 2013-01-06 NOTE — MAU Provider Note (Signed)

## 2013-01-15 ENCOUNTER — Ambulatory Visit (HOSPITAL_COMMUNITY): Admission: RE | Admit: 2013-01-15 | Payer: Medicaid Other | Source: Ambulatory Visit

## 2013-01-16 ENCOUNTER — Ambulatory Visit (HOSPITAL_COMMUNITY): Admission: RE | Admit: 2013-01-16 | Payer: Medicaid Other | Source: Ambulatory Visit

## 2013-01-28 ENCOUNTER — Ambulatory Visit (HOSPITAL_COMMUNITY)
Admission: RE | Admit: 2013-01-28 | Discharge: 2013-01-28 | Disposition: A | Discharge status: Home / Self Care | Payer: Medicaid Other | Source: Ambulatory Visit | Attending: Medical | Admitting: Medical

## 2013-01-28 ENCOUNTER — Inpatient Hospital Stay (HOSPITAL_COMMUNITY)
Admission: AD | Admit: 2013-01-28 | Discharge: 2013-01-28 | Disposition: A | Discharge status: Home / Self Care | Payer: Medicaid Other | Source: Ambulatory Visit | Attending: Obstetrics & Gynecology | Admitting: Obstetrics & Gynecology

## 2013-01-28 DIAGNOSIS — N912 Amenorrhea, unspecified: Secondary | ICD-10-CM | POA: Insufficient documentation

## 2013-01-28 DIAGNOSIS — N949 Unspecified condition associated with female genital organs and menstrual cycle: Secondary | ICD-10-CM | POA: Insufficient documentation

## 2013-01-28 LAB — POCT PREGNANCY, URINE: Preg Test, Ur: NEGATIVE

## 2013-01-28 MED ORDER — IBUPROFEN 600 MG PO TABS
600.0000 mg | ORAL_TABLET | Freq: Once | ORAL | Status: AC
  Administered 2013-01-28: 600 mg via ORAL
  Filled 2013-01-28: qty 1

## 2013-01-28 MED ORDER — MEDROXYPROGESTERONE ACETATE 10 MG PO TABS: 10.0000 mg | ORAL_TABLET | Freq: Every day | ORAL | Status: DC

## 2013-01-28 NOTE — MAU Provider Note (Signed)
[name] is a 27 y.o. [phone] who presents to MAU today after outpatient [place]. The patient was supposed to follow-up in clinic for further evaluation of amenorrhea, but was brought here for [place] results instead. She states that she has been taking the OCPs previously prescribed but has still not had a period. She states she started spotting today very lightly. She denies pain today. Patient states that she is in the last week of her first pack of OCPs.   BP 116/76  Pulse 81  Temp(Src) 98.1 F (36.7 C) (Oral)  Resp 20  SpO2 100%  LMP 01/14/2013 GENERAL: Well-developed, well-nourished female in no acute distress.  HEENT: Normocephalic, atraumatic.   LUNGS: Effort normal HEART: Regular rate  SKIN: Warm, dry and without erythema PSYCH: Normal mood and affect  US Transvaginal Non-ob  01/28/2013   *RADIOLOGY REPORT*  Clinical Data: Pelvic pain.  Amenorrhea.  TRANSABDOMINAL AND TRANSVAGINAL ULTRASOUND OF PELVIS  Technique:  Both transabdominal and transvaginal ultrasound examinations of the pelvis were performed.  Transabdominal technique was performed for global imaging of the pelvis including uterus, ovaries, adnexal regions, and pelvic cul-de-sac.  It was necessary to proceed with endovaginal exam following the transabdominal exam to visualize the endometrium and ovaries.  Comparison:  None.  Findings: Uterus:  9.5 x 5.3 x 5.6 cm.  No fibroids identified. Heterogeneous echogenicity of uterine myometrium noted.  Endometrium: Double layer endometrial thickness measures 13 mm transvaginally.  No focal lesion visualized.   Somewhat indistinct endometrial - myometrial junction.  Right ovary: 3.7 x 2.5 x 2.9 cm.  Normal appearance.  No adnexal mass identified.  Left ovary: 3.2 x 2.3 x 2.4 cm.  Normal appearance.  No adnexal mass identified.  Other Findings:  No free fluid  IMPRESSION:  1.  Nonspecific heterogeneous myometrium and indistinct endometrial - myometrial junction.  This finding can be seen  with uterine adenomyosis.  Pelvic MRI without contrast could be performed for further evaluation if clinically warranted. 2.  Normal appearance of both ovaries.  No adnexal mass identified.   Original Report Authenticated By: Myles Rosenthal, M.D.   US Pelvis Complete  01/28/2013   *RADIOLOGY REPORT*  Clinical Data: Pelvic pain.  Amenorrhea.  TRANSABDOMINAL AND TRANSVAGINAL ULTRASOUND OF PELVIS  Technique:  Both transabdominal and transvaginal ultrasound examinations of the pelvis were performed.  Transabdominal technique was performed for global imaging of the pelvis including uterus, ovaries, adnexal regions, and pelvic cul-de-sac.  It was necessary to proceed with endovaginal exam following the transabdominal exam to visualize the endometrium and ovaries.  Comparison:  None.  Findings: Uterus:  9.5 x 5.3 x 5.6 cm.  No fibroids identified. Heterogeneous echogenicity of uterine myometrium noted.  Endometrium: Double layer endometrial thickness measures 13 mm transvaginally.  No focal lesion visualized.   Somewhat indistinct endometrial - myometrial junction.  Right ovary: 3.7 x 2.5 x 2.9 cm.  Normal appearance.  No adnexal mass identified.  Left ovary: 3.2 x 2.3 x 2.4 cm.  Normal appearance.  No adnexal mass identified.  Other Findings:  No free fluid  IMPRESSION:  1.  Nonspecific heterogeneous myometrium and indistinct endometrial - myometrial junction.  This finding can be seen with uterine adenomyosis.  Pelvic MRI without contrast could be performed for further evaluation if clinically warranted. 2.  Normal appearance of both ovaries.  No adnexal mass identified.   Original Report Authenticated By: Myles Rosenthal, M.D.   MDM UPT today - Negative Rx for Provera sent to pharmacy and cancelled after  patient confirmed that she had not yet completed a full pack of OCPs. Will follow-up in clinic and see if she has a period at the end of this pack. If not then we can consider other options.    A: Amenorrhea  P: Discharge home Spotting today may indicate the beginning of a period as she should have one this week based on OCPs Patient encouraged to follow-up in clinic in 3-4 weeks. Referral note sent. They will call patient with an appointment Patient may return to MAU as needed.   Freddi Starr, PA-C 01/28/2013 3:06 PM

## 2013-01-28 NOTE — MAU Note (Signed)
Patient to MAU after a pelvic ultrasound. Patient states she is having spotting, no pain.

## 2013-01-28 NOTE — MAU Provider Note (Signed)
Attestation of Attending Supervision of Advanced Practitioner (CNM/NP): Evaluation and management procedures were performed by the Advanced Practitioner under my supervision and collaboration.  I have reviewed the Advanced Practitioner's note and chart, and I agree with the management and plan.  HARRAWAY-SMITH, Maysie Parkhill 4:57 PM     

## 2013-02-19 ENCOUNTER — Ambulatory Visit (INDEPENDENT_AMBULATORY_CARE_PROVIDER_SITE_OTHER): Payer: Medicaid Other | Admitting: Family Medicine

## 2013-02-19 ENCOUNTER — Encounter: Payer: Self-pay | Admitting: Family Medicine

## 2013-02-19 VITALS — BP 117/84 | HR 86 | Ht 66.0 in | Wt 297.9 lb

## 2013-02-19 DIAGNOSIS — L738 Other specified follicular disorders: Secondary | ICD-10-CM

## 2013-02-19 DIAGNOSIS — L739 Follicular disorder, unspecified: Secondary | ICD-10-CM

## 2013-02-19 DIAGNOSIS — N938 Other specified abnormal uterine and vaginal bleeding: Secondary | ICD-10-CM

## 2013-02-19 DIAGNOSIS — N949 Unspecified condition associated with female genital organs and menstrual cycle: Secondary | ICD-10-CM

## 2013-02-19 LAB — POCT PREGNANCY, URINE: Preg Test, Ur: NEGATIVE

## 2013-02-19 MED ORDER — CLINDAMYCIN HCL 300 MG PO CAPS: 300.0000 mg | ORAL_CAPSULE | Freq: Three times a day (TID) | ORAL | Status: DC

## 2013-02-19 NOTE — Progress Notes (Signed)
Patient ID: Monique Schmidt, female   DOB: 12/16/1985, 27 y.o.   MRN: [id number]  S:  Pt seen in MAU 6/11 for amenorrhea.  LMP February, just spotting. Had baby in November, breastfed for 2 months, not currently breastfeeding but has resumed sexual activity. No real periods since then. Has had irregular periods since menarche (age 71), but more regular prior to first pregnancy. Previously monthly lasting 7 days. Has been on Depo shots and OCPs at various times in past but no current birth control. Started OCPs in May but continues to have some spotting, pt not sure if it is during placebo week of OCP pack. This is only her second pack of pills since starting. She is currently on the white placebo pills of current pack and has had no bleeding this week. However, today she has had some spotting and cramping.  Pelvic ultrasound in May 2014 showed: "Nonspecific heterogeneous myometrium and indistinct endometrial - myometrial junction. This finding can be seen with uterine  adenomyosis. Pelvic MRI without contrast could be performed for further evaluation if clinically warranted. Normal appearance of both ovaries. No adnexal mass identified."  Pt also complains of frequent boils in axillae and current sores on left breast, some healed, some draining pus. No fever/chills.   Past Medical History  Diagnosis Date  . Abnormal Pap smear   . Gestational diabetes     all pregnancies glyburide started 11/4  . Thrombocytopenia    Past Surgical History  Procedure Laterality Date  . No past surgeries     No smoking  Filed Vitals:   02/19/13 1524  BP: 117/84  Pulse: 86   GEN:  Morbidly obese, no distress HEENT:  NCAT, EOMI, conjunctiva clear BREAST/AXILLAE:  Several small 0.5 cm lesions on left breast, some with hyperpigmentation and scarring, some with scabs, some pustular with surrounding erythema., No axillary lesions or lymphadenopathy. CV: RRR, no murmur RESP:  CTAB ABD:  Soft, non-tender, no  guarding or rebound, normal bowel sounds EXTREM:  Warm, well perfused, no edema or tenderness NEURO:  Alert, oriented, no focal deficits GU:  Deferred (GC/Chlamydia, wet prep, pelvic exam done in MAU in May)    Current Outpatient Prescriptions on File Prior to Visit  Medication Sig Dispense Refill  . amoxicillin (AMOXIL) 500 MG capsule Take 500 mg by mouth 3 (three) times daily.      Marland Kitchen ibuprofen (ADVIL,MOTRIN) 600 MG tablet Take 1 tablet (600 mg total) by mouth every 6 (six) hours as needed for pain.  30 tablet  0  . medroxyPROGESTERone (PROVERA) 10 MG tablet Take 1 tablet (10 mg total) by mouth daily.  10 tablet  0   No current facility-administered medications on file prior to visit.   ASSESSMENT/PLAN: - Continue current OCP regimen.  - She is morbidly obese and likely has some degree of PCOS. However, she has been able to conceive. OCPs will ensure that she does not have unopposed estrogen. - Reassured pt that she does not have to have a heavy period each month. However, she could continue to monitor her bleeding, and if she has undesirable amount of breakthrough bleeding during the first three weeks of her pill packs, we may need to change dosage of OCPs. - Follow up in 3-6 months - Likely hidradenitis suppurativa, folliculitis. Treat with clindamycin PO. Hot compresses.

## 2013-02-19 NOTE — Patient Instructions (Signed)
Folliculitis  Folliculitis is redness, soreness, and swelling (inflammation) of the hair follicles. This condition can occur anywhere on the body. People with weakened immune systems, diabetes, or obesity have a greater risk of getting folliculitis. CAUSES  Bacterial infection. This is the most common cause.  Fungal infection.  Viral infection.  Contact with certain chemicals, especially oils and tars. Long-term folliculitis can result from bacteria that live in the nostrils. The bacteria may trigger multiple outbreaks of folliculitis over time. SYMPTOMS Folliculitis most commonly occurs on the scalp, thighs, legs, back, buttocks, and areas where hair is shaved frequently. An early sign of folliculitis is a small, white or yellow, pus-filled, itchy lesion (pustule). These lesions appear on a red, inflamed follicle. They are usually less than 0.2 inches (5 mm) wide. When there is an infection of the follicle that goes deeper, it becomes a boil or furuncle. A group of closely packed boils creates a larger lesion (carbuncle). Carbuncles tend to occur in hairy, sweaty areas of the body. DIAGNOSIS  Your caregiver can usually tell what is wrong by doing a physical exam. A sample may be taken from one of the lesions and tested in a lab. This can help determine what is causing your folliculitis. TREATMENT  Treatment may include:  Applying warm compresses to the affected areas.  Taking antibiotic medicines orally or applying them to the skin.  Draining the lesions if they contain a large amount of pus or fluid.  Laser hair removal for cases of long-lasting folliculitis. This helps to prevent regrowth of the hair. HOME CARE INSTRUCTIONS  Apply warm compresses to the affected areas as directed by your caregiver.  If antibiotics are prescribed, take them as directed. Finish them even if you start to feel better.  You may take over-the-counter medicines to relieve itching.  Do not shave  irritated skin.  Follow up with your caregiver as directed. SEEK IMMEDIATE MEDICAL CARE IF:   You have increasing redness, swelling, or pain in the affected area.  You have a fever. MAKE SURE YOU:  Understand these instructions.  Will watch your condition.  Will get help right away if you are not doing well or get worse. Document Released: 10/15/2001 Document Revised: 02/05/2012 Document Reviewed: 11/06/2011 [facility] Patient Information 2014 [place], [place].  Dysfunctional Uterine Bleeding Normally, menstrual periods begin between ages 68 to 26 in young women. A normal menstrual cycle/period may begin every 23 days up to 35 days and lasts from 1 to 7 days. Around 12 to 14 days before your menstrual period starts, ovulation (ovary produces an egg) occurs. When counting the time between menstrual periods, count from the first day of bleeding of the previous period to the first day of bleeding of the next period. Dysfunctional (abnormal) uterine bleeding is bleeding that is different from a normal menstrual period. Your periods may come earlier or later than usual. They may be lighter, have blood clots or be heavier. You may have bleeding between periods, or you may skip one period or more. You may have bleeding after sexual intercourse, bleeding after menopause, or no menstrual period. CAUSES   Pregnancy (normal, miscarriage, tubal).  IUDs (intrauterine device, birth control).  Birth control pills.  Hormone treatment.  Menopause.  Infection of the cervix.  Blood clotting problems.  Infection of the inside lining of the uterus.  Endometriosis, inside lining of the uterus growing in the pelvis and other female organs.  Adhesions (scar tissue) inside the uterus.  Obesity or severe weight loss.  Uterine polyps inside the uterus.  Cancer of the vagina, cervix, or uterus.  Ovarian cysts or polycystic ovary syndrome.  Medical problems (diabetes, thyroid disease).  Uterine  fibroids (noncancerous tumor).  Problems with your female hormones.  Endometrial hyperplasia, very thick lining and enlarged cells inside of the uterus.  Medicines that interfere with ovulation.  Radiation to the pelvis or abdomen.  Chemotherapy. DIAGNOSIS   Your doctor will discuss the history of your menstrual periods, medicines you are taking, changes in your weight, stress in your life, and any medical problems you may have.  Your doctor will do a physical and pelvic examination.  Your doctor may want to perform certain tests to make a diagnosis, such as:  Pap test.  Blood tests.  Cultures for infection.  CT scan.  Ultrasound.  Hysteroscopy.  Laparoscopy.  MRI.  Hysterosalpingography.  D and C.  Endometrial biopsy. TREATMENT  Treatment will depend on the cause of the dysfunctional uterine bleeding (DUB). Treatment may include:  Observing your menstrual periods for a couple of months.  Prescribing medicines for medical problems, including:  Antibiotics.  Hormones.  Birth control pills.  Removing an IUD (intrauterine device, birth control).  Surgery:  D and C (scrape and remove tissue from inside the uterus).  Laparoscopy (examine inside the abdomen with a lighted tube).  Uterine ablation (destroy lining of the uterus with electrical current, laser, heat, or freezing).  Hysteroscopy (examine cervix and uterus with a lighted tube).  Hysterectomy (remove the uterus). HOME CARE INSTRUCTIONS   If medicines were prescribed, take exactly as directed. Do not change or switch medicines without consulting your caregiver.  Long term heavy bleeding may result in iron deficiency. Your caregiver may have prescribed iron pills. They help replace the iron that your body lost from heavy bleeding. Take exactly as directed.  Do not take aspirin or medicines that contain aspirin one week before or during your menstrual period. Aspirin may make the bleeding  worse.  If you need to change your sanitary pad or tampon more than once every 2 hours, stay in bed with your feet elevated and a cold pack on your lower abdomen. Rest as much as possible, until the bleeding stops or slows down.  Eat well-balanced meals. Eat foods high in iron. Examples are:  Leafy green vegetables.  Whole-grain breads and cereals.  Eggs.  Meat.  Liver.  Do not try to lose weight until the abnormal bleeding has stopped and your blood iron level is back to normal. Do not lift more than ten pounds or do strenuous activities when you are bleeding.  For a couple of months, make note on your calendar, marking the start and ending of your period, and the type of bleeding (light, medium, heavy, spotting, clots or missed periods). This is for your caregiver to better evaluate your problem. SEEK MEDICAL CARE IF:   You develop nausea (feeling sick to your stomach) and vomiting, dizziness, or diarrhea while you are taking your medicine.  You are getting lightheaded or weak.  You have any problems that may be related to the medicine you are taking.  You develop pain with your DUB.  You want to remove your IUD.  You want to stop or change your birth control pills or hormones.  You have any type of abnormal bleeding mentioned above.  You are over 24 years old and have not had a menstrual period yet.  You are 27 years old and you are still having menstrual periods.  You have any of the symptoms mentioned above.  You develop a rash. SEEK IMMEDIATE MEDICAL CARE IF:   An oral temperature above 102 F (38.9 C) develops.  You develop chills.  You are changing your sanitary pad or tampon more than once an hour.  You develop abdominal pain.  You pass out or faint. Document Released: 08/03/2000 Document Revised: 10/29/2011 Document Reviewed: 07/05/2009 [facility] Patient Information 2014 [place], [place].

## 2013-03-02 ENCOUNTER — Emergency Department (HOSPITAL_COMMUNITY)
Admission: EM | Admit: 2013-03-02 | Discharge: 2013-03-02 | Disposition: A | Discharge status: Home / Self Care | Payer: Medicaid Other | Attending: Emergency Medicine | Admitting: Emergency Medicine

## 2013-03-02 ENCOUNTER — Encounter (HOSPITAL_COMMUNITY): Payer: Self-pay | Admitting: Family Medicine

## 2013-03-02 DIAGNOSIS — Z862 Personal history of diseases of the blood and blood-forming organs and certain disorders involving the immune mechanism: Secondary | ICD-10-CM | POA: Insufficient documentation

## 2013-03-02 DIAGNOSIS — Z91040 Latex allergy status: Secondary | ICD-10-CM | POA: Insufficient documentation

## 2013-03-02 DIAGNOSIS — Z3202 Encounter for pregnancy test, result negative: Secondary | ICD-10-CM | POA: Insufficient documentation

## 2013-03-02 DIAGNOSIS — Z8632 Personal history of gestational diabetes: Secondary | ICD-10-CM | POA: Insufficient documentation

## 2013-03-02 DIAGNOSIS — R112 Nausea with vomiting, unspecified: Secondary | ICD-10-CM | POA: Insufficient documentation

## 2013-03-02 DIAGNOSIS — T398X1A Poisoning by other nonopioid analgesics and antipyretics, not elsewhere classified, accidental (unintentional), initial encounter: Secondary | ICD-10-CM | POA: Insufficient documentation

## 2013-03-02 DIAGNOSIS — Z79899 Other long term (current) drug therapy: Secondary | ICD-10-CM | POA: Insufficient documentation

## 2013-03-02 DIAGNOSIS — T50905A Adverse effect of unspecified drugs, medicaments and biological substances, initial encounter: Secondary | ICD-10-CM

## 2013-03-02 DIAGNOSIS — Y929 Unspecified place or not applicable: Secondary | ICD-10-CM | POA: Insufficient documentation

## 2013-03-02 DIAGNOSIS — Y939 Activity, unspecified: Secondary | ICD-10-CM | POA: Insufficient documentation

## 2013-03-02 LAB — URINALYSIS, ROUTINE W REFLEX MICROSCOPIC
Bilirubin Urine: NEGATIVE
Hgb urine dipstick: NEGATIVE
Nitrite: NEGATIVE
Specific Gravity, Urine: 1.029 (ref 1.005–1.030)
Urobilinogen, UA: 1 mg/dL (ref 0.0–1.0)
pH: 6 (ref 5.0–8.0)

## 2013-03-02 LAB — URINE MICROSCOPIC-ADD ON

## 2013-03-02 LAB — POCT PREGNANCY, URINE: Preg Test, Ur: NEGATIVE

## 2013-03-02 MED ORDER — IBUPROFEN 600 MG PO TABS: 600.0000 mg | ORAL_TABLET | Freq: Four times a day (QID) | ORAL | Status: DC | PRN

## 2013-03-02 MED ORDER — OXYCODONE-ACETAMINOPHEN 5-325 MG PO TABS: 1.0000 | ORAL_TABLET | Freq: Four times a day (QID) | ORAL | Status: DC | PRN

## 2013-03-02 MED ORDER — ONDANSETRON 4 MG PO TBDP
4.0000 mg | ORAL_TABLET | Freq: Once | ORAL | Status: AC
  Administered 2013-03-02: 4 mg via ORAL
  Filled 2013-03-02: qty 1

## 2013-03-02 MED ORDER — OXYCODONE-ACETAMINOPHEN 5-325 MG PO TABS
1.0000 | ORAL_TABLET | Freq: Once | ORAL | Status: AC
  Administered 2013-03-02: 1 via ORAL
  Filled 2013-03-02: qty 1

## 2013-03-02 MED ORDER — IBUPROFEN 800 MG PO TABS
800.0000 mg | ORAL_TABLET | Freq: Once | ORAL | Status: AC
  Administered 2013-03-02: 800 mg via ORAL
  Filled 2013-03-02: qty 1

## 2013-03-02 NOTE — ED Provider Notes (Signed)
History    CSN: [id number] Arrival date & time 03/02/13  0023  First MD Initiated Contact with Patient 03/02/13 0413     Chief Complaint  Patient presents with  . Emesis   HPI Monique Schmidt is a 27 y.o. female who had 5 teeth pulled under general anesthesia on Tuesday, she's had some nausea and vomiting since that time, she notices this when she takes Vicodin for pain, she has no abdominal pain, no fevers, no chills. Patient did not get her prescription for antibiotics filled. Nausea and vomiting has been moderate to severe, ongoing, intermittent, without alleviating or exacerbating factors.  Past Medical History  Diagnosis Date  . Abnormal Pap smear   . Gestational diabetes     all pregnancies glyburide started 11/4  . Thrombocytopenia    Past Surgical History  Procedure Laterality Date  . Wisdom tooth extraction     Family History  Problem Relation Age of Onset  . Hypertension Father   . Diabetes Father   . Diabetes Mother   . Seizures Brother    History  Substance Use Topics  . Smoking status: Never Smoker   . Smokeless tobacco: Never Used  . Alcohol Use: No   OB History   Grav Para Term Preterm Abortions TAB SAB Ect Mult Living   4 3 3  1  1   3      Review of Systems At least 10pt or greater review of systems completed and are negative except where specified in the HPI.  Allergies  Latex  Home Medications   Current Outpatient Rx  Name  Route  Sig  Dispense  Refill  . HYDROcodone-acetaminophen (NORCO/VICODIN) 5-325 MG per tablet   Oral   Take 1 tablet by mouth every 6 (six) hours as needed for pain.         . norgestimate-ethinyl estradiol (ORTHO-CYCLEN,SPRINTEC,PREVIFEM) 0.25-35 MG-MCG tablet   Oral   Take 1 tablet by mouth daily.          BP 111/73  Pulse 85  Temp(Src) 98.9 F (37.2 C) (Oral)  Resp 20  Ht 5\' 6"  (1.676 m)  Wt 275 lb (124.739 kg)  BMI 44.41 kg/m2  SpO2 100%  LMP 02/20/2013 Physical Exam  Nursing notes reviewed.   Electronic medical record reviewed. VITAL SIGNS:   Filed Vitals:   03/02/13 0058 03/02/13 0641  BP: 111/73 105/60  Pulse: 85 90  Temp: 98.9 F (37.2 C)   TempSrc: Oral   Resp: 20 18  Height: 5\' 6"  (1.676 m)   Weight: 275 lb (124.739 kg)   SpO2: 100% 100%   CONSTITUTIONAL: Awake, oriented, appears non-toxic HENT: Atraumatic, normocephalic, oral mucosa pink and moist, airway patent. Tooth sockets are closed, they do not appear erythematous, there is no purulent drainage.  Nares patent without drainage. External ears normal. EYES: Conjunctiva clear, EOMI, PERRLA NECK: Trachea midline, non-tender, supple CARDIOVASCULAR: Normal heart rate, Normal rhythm, No murmurs, rubs, gallops PULMONARY/CHEST: Clear to auscultation, no rhonchi, wheezes, or rales. Symmetrical breath sounds. Non-tender. ABDOMINAL: Non-distended, soft, non-tender - no rebound or guarding.  BS normal. NEUROLOGIC: Non-focal, moving all four extremities, no gross sensory or motor deficits. EXTREMITIES: No clubbing, cyanosis, or edema SKIN: Warm, Dry, No erythema, No rash  ED Course  Procedures (including critical care time) Labs Reviewed  URINALYSIS, ROUTINE W REFLEX MICROSCOPIC - Abnormal; Notable for the following:    APPearance CLOUDY (*)    Leukocytes, UA MODERATE (*)    All other components within normal  limits  URINE MICROSCOPIC-ADD ON - Abnormal; Notable for the following:    Squamous Epithelial / LPF MANY (*)    All other components within normal limits  POCT PREGNANCY, URINE   No results found. 1. Nausea and vomiting   2. Medication side effect, initial encounter     MDM  On further history, patient is concerned she is pregnant, urine pregnancy test is negative. Nausea and vomiting secondary likely to Vicodin, nausea control Zofran in the emergency department, prescribed Percocet instead of Vicodin. Tooth sockets are intact, do not appear infected, I do not think she needs antibiotics at this time.  Followup with dentist.  Jones Skene, MD 03/06/13 1610

## 2013-03-02 NOTE — ED Notes (Signed)
Patient states that she had teeth pulled under general anesthesia on Tuesday. States she has had nausea and vomiting since that time. Has been taking Vicodin for pain.

## 2013-05-26 ENCOUNTER — Emergency Department (HOSPITAL_COMMUNITY)
Admission: EM | Admit: 2013-05-26 | Discharge: 2013-05-26 | Disposition: A | Discharge status: Home / Self Care | Payer: Medicaid Other | Attending: Emergency Medicine | Admitting: Emergency Medicine

## 2013-05-26 ENCOUNTER — Encounter (HOSPITAL_COMMUNITY): Payer: Self-pay | Admitting: Emergency Medicine

## 2013-05-26 DIAGNOSIS — O9989 Other specified diseases and conditions complicating pregnancy, childbirth and the puerperium: Secondary | ICD-10-CM | POA: Insufficient documentation

## 2013-05-26 DIAGNOSIS — R05 Cough: Secondary | ICD-10-CM | POA: Insufficient documentation

## 2013-05-26 DIAGNOSIS — R51 Headache: Secondary | ICD-10-CM | POA: Insufficient documentation

## 2013-05-26 DIAGNOSIS — R6883 Chills (without fever): Secondary | ICD-10-CM | POA: Insufficient documentation

## 2013-05-26 DIAGNOSIS — R059 Cough, unspecified: Secondary | ICD-10-CM | POA: Insufficient documentation

## 2013-05-26 DIAGNOSIS — O2390 Unspecified genitourinary tract infection in pregnancy, unspecified trimester: Secondary | ICD-10-CM | POA: Insufficient documentation

## 2013-05-26 DIAGNOSIS — Z880 Allergy status to penicillin: Secondary | ICD-10-CM | POA: Insufficient documentation

## 2013-05-26 DIAGNOSIS — R112 Nausea with vomiting, unspecified: Secondary | ICD-10-CM | POA: Insufficient documentation

## 2013-05-26 DIAGNOSIS — R5381 Other malaise: Secondary | ICD-10-CM | POA: Insufficient documentation

## 2013-05-26 DIAGNOSIS — R197 Diarrhea, unspecified: Secondary | ICD-10-CM | POA: Insufficient documentation

## 2013-05-26 DIAGNOSIS — H9209 Otalgia, unspecified ear: Secondary | ICD-10-CM | POA: Insufficient documentation

## 2013-05-26 DIAGNOSIS — Z79899 Other long term (current) drug therapy: Secondary | ICD-10-CM | POA: Insufficient documentation

## 2013-05-26 DIAGNOSIS — O98819 Other maternal infectious and parasitic diseases complicating pregnancy, unspecified trimester: Secondary | ICD-10-CM | POA: Insufficient documentation

## 2013-05-26 DIAGNOSIS — Z91040 Latex allergy status: Secondary | ICD-10-CM | POA: Insufficient documentation

## 2013-05-26 DIAGNOSIS — Z8632 Personal history of gestational diabetes: Secondary | ICD-10-CM | POA: Insufficient documentation

## 2013-05-26 DIAGNOSIS — Z3490 Encounter for supervision of normal pregnancy, unspecified, unspecified trimester: Secondary | ICD-10-CM

## 2013-05-26 DIAGNOSIS — B9689 Other specified bacterial agents as the cause of diseases classified elsewhere: Secondary | ICD-10-CM

## 2013-05-26 DIAGNOSIS — Z862 Personal history of diseases of the blood and blood-forming organs and certain disorders involving the immune mechanism: Secondary | ICD-10-CM | POA: Insufficient documentation

## 2013-05-26 LAB — CBC WITH DIFFERENTIAL/PLATELET
Hemoglobin: 14 g/dL (ref 12.0–15.0)
Lymphs Abs: 2 10*3/uL (ref 0.7–4.0)
Monocytes Relative: 6 % (ref 3–12)
Neutro Abs: 6 10*3/uL (ref 1.7–7.7)
Neutrophils Relative %: 69 % (ref 43–77)
RBC: 5 MIL/uL (ref 3.87–5.11)

## 2013-05-26 LAB — COMPREHENSIVE METABOLIC PANEL
Albumin: 3.6 g/dL (ref 3.5–5.2)
Alkaline Phosphatase: 81 U/L (ref 39–117)
BUN: 8 mg/dL (ref 6–23)
Chloride: 98 mEq/L (ref 96–112)
GFR calc non Af Amer: >90 mL/min (ref >90)
Glucose, Bld: 102 mg/dL — ABNORMAL HIGH (ref 70–99)
Potassium: 3.5 mEq/L (ref 3.5–5.1)

## 2013-05-26 LAB — URINALYSIS, ROUTINE W REFLEX MICROSCOPIC
Leukocytes, UA: NEGATIVE
Nitrite: NEGATIVE
Specific Gravity, Urine: 1.034 — ABNORMAL HIGH (ref 1.005–1.030)
pH: 6 (ref 5.0–8.0)

## 2013-05-26 LAB — WET PREP, GENITAL

## 2013-05-26 LAB — POCT PREGNANCY, URINE: Preg Test, Ur: POSITIVE — AB

## 2013-05-26 MED ORDER — METRONIDAZOLE 500 MG PO TABS
2000.0000 mg | ORAL_TABLET | Freq: Once | ORAL | Status: AC
  Administered 2013-05-26: 2000 mg via ORAL
  Filled 2013-05-26: qty 4

## 2013-05-26 MED ORDER — ONDANSETRON 4 MG PO TBDP
4.0000 mg | ORAL_TABLET | Freq: Once | ORAL | Status: AC
  Administered 2013-05-26: 4 mg via ORAL
  Filled 2013-05-26: qty 1

## 2013-05-26 MED ORDER — SODIUM CHLORIDE 0.9 % IV BOLUS (SEPSIS)
1000.0000 mL | Freq: Once | INTRAVENOUS | Status: AC
  Administered 2013-05-26: 1000 mL via INTRAVENOUS

## 2013-05-26 MED ORDER — ONDANSETRON HCL 4 MG PO TABS: 4.0000 mg | ORAL_TABLET | Freq: Four times a day (QID) | ORAL | Status: DC

## 2013-05-26 MED ORDER — ACETAMINOPHEN 325 MG PO TABS
650.0000 mg | ORAL_TABLET | Freq: Once | ORAL | Status: AC
  Administered 2013-05-26: 650 mg via ORAL
  Filled 2013-05-26: qty 2

## 2013-05-26 NOTE — ED Provider Notes (Signed)
CSN: [id number]     Arrival date & time 05/26/13  1438 History  This chart was scribed for non-physician practitioner Marlon Pel, PA-C, working with Juliet Rude. Rubin Payor, MD by Ronal Fear, ED scribe. This patient was seen in room TR08C/TR08C and the patient's care was started at 5:20 PM.    Chief Complaint  Patient presents with  . Influenza    Patient is a 27 y.o. female presenting with flu symptoms. The history is provided by the patient. No language interpreter was used.  Influenza Presenting symptoms: cough, diarrhea, fatigue, headache, nausea and vomiting   Presenting symptoms: no sore throat   Severity:  Mild Onset quality:  Gradual Duration:  1 week Progression:  Worsening Chronicity:  New Relieved by:  None tried Worsened by:  Nothing tried Ineffective treatments:  Drinking and rest Associated symptoms: chills and ear pain    Pt states that she has a foul smelling vaginal discharge. she denies cramping,abdominal pain, sore throat. Pt has no hx of HTN asthma or diabetes.  Pt denies recent travel to another country. Has been in [place] for the past 6 months. She has not been around any sick contacts.  LMP - 2 months ago  Past Medical History  Diagnosis Date  . Abnormal Pap smear   . Gestational diabetes     all pregnancies glyburide started 11/4  . Thrombocytopenia    Past Surgical History  Procedure Laterality Date  . Wisdom tooth extraction     Family History  Problem Relation Age of Onset  . Hypertension Father   . Diabetes Father   . Diabetes Mother   . Seizures Brother    History  Substance Use Topics  . Smoking status: Never Smoker   . Smokeless tobacco: Never Used  . Alcohol Use: No   OB History   Grav Para Term Preterm Abortions TAB SAB Ect Mult Living   4 3 3  1  1   3      Review of Systems  Constitutional: Positive for chills and fatigue.  HENT: Positive for ear pain. Negative for sore throat.   Respiratory: Positive for cough.    Gastrointestinal: Positive for nausea, vomiting and diarrhea.  Genitourinary: Positive for vaginal discharge.  Neurological: Positive for headaches.    Allergies  Penicillins and Latex  Home Medications   Current Outpatient Rx  Name  Route  Sig  Dispense  Refill  . norgestimate-ethinyl estradiol (ORTHO-CYCLEN,SPRINTEC,PREVIFEM) 0.25-35 MG-MCG tablet   Oral   Take 1 tablet by mouth daily.         . ondansetron (ZOFRAN) 4 MG tablet   Oral   Take 1 tablet (4 mg total) by mouth every 6 (six) hours.   12 tablet   0    BP 116/56  Pulse 91  Temp(Src) 98.7 F (37.1 C) (Oral)  Resp 18  SpO2 98% Physical Exam  Nursing note and vitals reviewed. Constitutional: She appears well-developed and well-nourished. No distress.  HENT:  Head: Normocephalic and atraumatic.  Eyes: Pupils are equal, round, and reactive to light.  Neck: Normal range of motion. Neck supple.  Cardiovascular: Normal rate and regular rhythm.   Pulmonary/Chest: Effort normal.  Abdominal: Soft.  Genitourinary: Uterus is enlarged. No tenderness or bleeding around the vagina. No foreign body around the vagina. Vaginal discharge found.  Foul smelling odor  Neurological: She is alert.  Skin: Skin is warm and dry.    ED Course  Procedures (including critical care time)  DIAGNOSTIC STUDIES: Oxygen Saturation  is 98% on room air, normal by my interpretation.   Pt has positive urine pregnancy test  COORDINATION OF CARE:      Labs Review Labs Reviewed  WET PREP, GENITAL - Abnormal; Notable for the following:    Clue Cells Wet Prep HPF POC FEW (*)    WBC, Wet Prep HPF POC FEW (*)    All other components within normal limits  URINALYSIS, ROUTINE W REFLEX MICROSCOPIC - Abnormal; Notable for the following:    APPearance CLOUDY (*)    Specific Gravity, Urine 1.034 (*)    All other components within normal limits  COMPREHENSIVE METABOLIC PANEL - Abnormal; Notable for the following:    Sodium 134 (*)     Glucose, Bld 102 (*)    Total Bilirubin 0.2 (*)    All other components within normal limits  POCT PREGNANCY, URINE - Abnormal; Notable for the following:    Preg Test, Ur POSITIVE (*)    All other components within normal limits  GC/CHLAMYDIA PROBE AMP  CBC WITH DIFFERENTIAL  LIPASE, BLOOD   Imaging Review No results found.  MDM   1. Pregnancy   2. Bacterial vaginosis      Patient has positive urine pregnancy. She was informed about this and is upset since she has an 8 month old baby. Wet prep shows.   Plan: 1. prenatal vitamins. 2. Flagyl to treat for BV. 3. Will wait  for gc cultures for treatment of poss STD's since pt says she is monogamous   [facility]  27 y.o.Monique Schmidt's evaluation in the Emergency Department is complete. It has been determined that no acute conditions requiring further emergency intervention are present at this time. The patient/guardian have been advised of the diagnosis and plan. We have discussed signs and symptoms that warrant return to the ED, such as changes or worsening in symptoms.  Vital signs are stable at discharge. Filed Vitals:   05/26/13 1445  BP: 116/56  Pulse: 91  Temp: 98.7 F (37.1 C)  Resp: 18    Patient/guardian has voiced understanding and agreed to follow-up with the PCP or specialist.  I personally performed the services described in this documentation, which was scribed in my presence. The recorded information has been reviewed and is accurate.   Dorthula Matas, PA-C 05/26/13 1716  Dorthula Matas, PA-C 05/26/13 1720

## 2013-05-26 NOTE — ED Notes (Signed)
Pt here with flu like sx with cough, congestion and body aches

## 2013-05-27 LAB — GC/CHLAMYDIA PROBE AMP: GC Probe RNA: NEGATIVE

## 2013-05-29 NOTE — ED Provider Notes (Signed)
Medical screening examination/treatment/procedure(s) were performed by non-physician practitioner and as supervising physician I was immediately available for consultation/collaboration.  Serita Degroote R. Kamyla Olejnik, MD 05/29/13 1016 

## 2013-06-15 ENCOUNTER — Emergency Department (HOSPITAL_COMMUNITY): Payer: Medicaid Other

## 2013-06-15 ENCOUNTER — Encounter (HOSPITAL_COMMUNITY): Payer: Self-pay | Admitting: Emergency Medicine

## 2013-06-15 ENCOUNTER — Emergency Department (HOSPITAL_COMMUNITY)
Admission: EM | Admit: 2013-06-15 | Discharge: 2013-06-15 | Disposition: A | Discharge status: Home / Self Care | Payer: Medicaid Other | Attending: Emergency Medicine | Admitting: Emergency Medicine

## 2013-06-15 DIAGNOSIS — R059 Cough, unspecified: Secondary | ICD-10-CM | POA: Insufficient documentation

## 2013-06-15 DIAGNOSIS — Z91040 Latex allergy status: Secondary | ICD-10-CM | POA: Insufficient documentation

## 2013-06-15 DIAGNOSIS — J3489 Other specified disorders of nose and nasal sinuses: Secondary | ICD-10-CM | POA: Insufficient documentation

## 2013-06-15 DIAGNOSIS — Z880 Allergy status to penicillin: Secondary | ICD-10-CM | POA: Insufficient documentation

## 2013-06-15 DIAGNOSIS — R51 Headache: Secondary | ICD-10-CM | POA: Insufficient documentation

## 2013-06-15 DIAGNOSIS — Z3490 Encounter for supervision of normal pregnancy, unspecified, unspecified trimester: Secondary | ICD-10-CM

## 2013-06-15 DIAGNOSIS — E669 Obesity, unspecified: Secondary | ICD-10-CM | POA: Insufficient documentation

## 2013-06-15 DIAGNOSIS — B9689 Other specified bacterial agents as the cause of diseases classified elsewhere: Secondary | ICD-10-CM | POA: Insufficient documentation

## 2013-06-15 DIAGNOSIS — M549 Dorsalgia, unspecified: Secondary | ICD-10-CM | POA: Insufficient documentation

## 2013-06-15 DIAGNOSIS — A499 Bacterial infection, unspecified: Secondary | ICD-10-CM | POA: Insufficient documentation

## 2013-06-15 DIAGNOSIS — R110 Nausea: Secondary | ICD-10-CM | POA: Insufficient documentation

## 2013-06-15 DIAGNOSIS — Z862 Personal history of diseases of the blood and blood-forming organs and certain disorders involving the immune mechanism: Secondary | ICD-10-CM | POA: Insufficient documentation

## 2013-06-15 DIAGNOSIS — O2390 Unspecified genitourinary tract infection in pregnancy, unspecified trimester: Secondary | ICD-10-CM | POA: Insufficient documentation

## 2013-06-15 DIAGNOSIS — H9209 Otalgia, unspecified ear: Secondary | ICD-10-CM | POA: Insufficient documentation

## 2013-06-15 DIAGNOSIS — J029 Acute pharyngitis, unspecified: Secondary | ICD-10-CM | POA: Insufficient documentation

## 2013-06-15 DIAGNOSIS — R109 Unspecified abdominal pain: Secondary | ICD-10-CM | POA: Insufficient documentation

## 2013-06-15 DIAGNOSIS — R0602 Shortness of breath: Secondary | ICD-10-CM | POA: Insufficient documentation

## 2013-06-15 DIAGNOSIS — Z792 Long term (current) use of antibiotics: Secondary | ICD-10-CM | POA: Insufficient documentation

## 2013-06-15 DIAGNOSIS — R0789 Other chest pain: Secondary | ICD-10-CM | POA: Insufficient documentation

## 2013-06-15 DIAGNOSIS — N760 Acute vaginitis: Secondary | ICD-10-CM | POA: Insufficient documentation

## 2013-06-15 DIAGNOSIS — R05 Cough: Secondary | ICD-10-CM | POA: Insufficient documentation

## 2013-06-15 DIAGNOSIS — K5900 Constipation, unspecified: Secondary | ICD-10-CM | POA: Insufficient documentation

## 2013-06-15 LAB — URINALYSIS, ROUTINE W REFLEX MICROSCOPIC
Bilirubin Urine: NEGATIVE
Glucose, UA: NEGATIVE mg/dL
Hgb urine dipstick: NEGATIVE
Nitrite: NEGATIVE
Specific Gravity, Urine: 1.027 (ref 1.005–1.030)
pH: 6 (ref 5.0–8.0)

## 2013-06-15 LAB — COMPREHENSIVE METABOLIC PANEL
ALT: 16 U/L (ref 0–35)
AST: 16 U/L (ref 0–37)
Albumin: 3.1 g/dL — ABNORMAL LOW (ref 3.5–5.2)
Calcium: 9.3 mg/dL (ref 8.4–10.5)
Chloride: 99 mEq/L (ref 96–112)
Creatinine, Ser: 0.56 mg/dL (ref 0.50–1.10)
Sodium: 133 mEq/L — ABNORMAL LOW (ref 135–145)

## 2013-06-15 LAB — POCT I-STAT TROPONIN I: Troponin i, poc: 0.01 ng/mL (ref 0.00–0.08)

## 2013-06-15 LAB — CBC WITH DIFFERENTIAL/PLATELET
Basophils Relative: 0 % (ref 0–1)
Eosinophils Relative: 4 % (ref 0–5)
Hemoglobin: 12.5 g/dL (ref 12.0–15.0)
Lymphs Abs: 1.4 10*3/uL (ref 0.7–4.0)
MCH: 27.2 pg (ref 26.0–34.0)
MCHC: 34.3 g/dL (ref 30.0–36.0)
MCV: 79.1 fL (ref 78.0–100.0)
Monocytes Absolute: 0.5 10*3/uL (ref 0.1–1.0)
Neutro Abs: 4.7 10*3/uL (ref 1.7–7.7)
RBC: 4.6 MIL/uL (ref 3.87–5.11)

## 2013-06-15 LAB — WET PREP, GENITAL
Trich, Wet Prep: NONE SEEN
Yeast Wet Prep HPF POC: NONE SEEN

## 2013-06-15 LAB — HCG, QUANTITATIVE, PREGNANCY: hCG, Beta Chain, Quant, S: 39622 m[IU]/mL — ABNORMAL HIGH (ref <5)

## 2013-06-15 MED ORDER — POTASSIUM CHLORIDE CRYS ER 20 MEQ PO TBCR
40.0000 meq | EXTENDED_RELEASE_TABLET | Freq: Once | ORAL | Status: AC
  Administered 2013-06-15: 40 meq via ORAL
  Filled 2013-06-15: qty 2

## 2013-06-15 MED ORDER — SODIUM CHLORIDE 0.9 % IV BOLUS (SEPSIS)
1000.0000 mL | Freq: Once | INTRAVENOUS | Status: AC
  Administered 2013-06-15: 1000 mL via INTRAVENOUS

## 2013-06-15 MED ORDER — ONDANSETRON HCL 4 MG PO TABS: 4.0000 mg | ORAL_TABLET | Freq: Four times a day (QID) | ORAL | Status: DC

## 2013-06-15 MED ORDER — ONDANSETRON HCL 4 MG/2ML IJ SOLN
4.0000 mg | Freq: Once | INTRAMUSCULAR | Status: AC
Start: 2013-06-15 — End: 2013-06-15
  Administered 2013-06-15: 4 mg via INTRAVENOUS
  Filled 2013-06-15: qty 2

## 2013-06-15 MED ORDER — METRONIDAZOLE 500 MG PO TABS: 500.0000 mg | ORAL_TABLET | Freq: Two times a day (BID) | ORAL | Status: DC

## 2013-06-15 MED ORDER — PRENATAL COMPLETE 14-0.4 MG PO TABS: 1.0000 | ORAL_TABLET | Freq: Once | ORAL | Status: DC

## 2013-06-15 NOTE — ED Notes (Signed)
Pt presents with c/o bilateral ear pain, sore throat, cold symptoms, and abdominal pain. Pt says she is pregnant and her abdominal pain also started two days ago. Pt denies nausea and vomiting, but does admit to a vaginal odor.

## 2013-06-15 NOTE — ED Provider Notes (Signed)
CSN: [id number]     Arrival date & time 06/15/13  1455 History   First MD Initiated Contact with Patient 06/15/13 1541     Chief Complaint  Patient presents with  . Headache  . Sore Throat  . Abdominal Pain   (Consider location/radiation/quality/duration/timing/severity/associated sxs/prior Treatment) The history is provided by the patient. No language interpreter was used.  Monique Schmidt is a 27 y/o F with PMHx of gestational DM, thrombocytopenia, presenting to the ED with cold-like symptoms. Patient reported that she has been having sore throat, cough, nasal congestion, that has been ongoing for the past 2 weeks. Patient reported that the sore throat has gotten progressively worse - stated that it "hurts real bad" - worse with swallowing; patient concern do to daughter having streptococcal pharyngitis. Patient reported that started to experience abdominal pain x 2 days ago - patient reported that she has been having mild abdominal cramping localized to the lower abdomen. Patient reported that she started to notice an odor described as a fishy odor to the vaginal region approximately 2 days ago. Patient reported that she's been having bilateral ear pain-described as a throbbing soreness sensation-denied drainage. Patient reports she's been experiencing chest discomfort, described as a throbbing sensation localized to the center of the chest that is intermittent lasting approximately 30 minutes-no trend identified. Patient denied taking any prenatal vitamins, reported that the last and that she was seen emergency apartment 05/26/2013 she was diagnosed with pregnancy. G4 P3 003. LMP according to patient was back in August 2014. Denied vaginal bleeding, vaginal discharge, dysuria, hematuria, neck pain, neck stiffness, diarrhea, vomiting. [facility] - patient reported that she has an appointment on 06/22/2013.  Past Medical History  Diagnosis Date  . Abnormal Pap smear   . Gestational  diabetes     all pregnancies glyburide started 11/4  . Thrombocytopenia    Past Surgical History  Procedure Laterality Date  . Wisdom tooth extraction     Family History  Problem Relation Age of Onset  . Hypertension Father   . Diabetes Father   . Diabetes Mother   . Seizures Brother    History  Substance Use Topics  . Smoking status: Never Smoker   . Smokeless tobacco: Never Used  . Alcohol Use: No   OB History   Grav Para Term Preterm Abortions TAB SAB Ect Mult Living   5 3 3  1  1   3      Review of Systems  Constitutional: Negative for fever and chills.  HENT: Positive for sore throat. Negative for trouble swallowing.   Respiratory: Positive for shortness of breath. Negative for chest tightness.   Cardiovascular: Positive for chest pain.  Gastrointestinal: Positive for nausea, abdominal pain and constipation. Negative for vomiting, blood in stool and anal bleeding.  Genitourinary: Negative for vaginal bleeding, vaginal discharge and vaginal pain.  Musculoskeletal: Positive for back pain.  Neurological: Positive for headaches. Negative for dizziness and weakness.  All other systems reviewed and are negative.    Allergies  Penicillins and Latex  Home Medications   Current Outpatient Rx  Name  Route  Sig  Dispense  Refill  . metroNIDAZOLE (FLAGYL) 500 MG tablet   Oral   Take 1 tablet (500 mg total) by mouth 2 (two) times daily.   14 tablet   0   . ondansetron (ZOFRAN) 4 MG tablet   Oral   Take 1 tablet (4 mg total) by mouth every 6 (six) hours.   12  tablet   0   . Prenatal Vit-Fe Fumarate-FA (PRENATAL COMPLETE) 14-0.4 MG TABS   Oral   Take 1 tablet by mouth once.   60 each   0    BP 135/83  Pulse 91  Temp(Src) 97.6 F (36.4 C) (Oral)  Resp 16  SpO2 100%  LMP 02/20/2013 Physical Exam  Nursing note and vitals reviewed. Constitutional: She is oriented to person, place, and time. She appears well-developed and well-nourished. No distress.  HENT:    Head: Normocephalic and atraumatic.  Right Ear: External ear normal.  Left Ear: External ear normal.  Mouth/Throat: Oropharynx is clear and moist. No oropharyngeal exudate.  Negative erythema, swelling, inflammation, exudate, petechiae noted to the posterior oropharynx and tonsils bilaterally. Uvula midline, symmetrical elevation. Negative peritonsillar abscess findings.    Eyes: Conjunctivae and EOM are normal. Pupils are equal, round, and reactive to light. Right eye exhibits no discharge. Left eye exhibits no discharge.  Neck: Normal range of motion. Neck supple.  Cardiovascular: Normal rate, regular rhythm and normal heart sounds.  Exam reveals no friction rub.   No murmur heard. Pulses:      Radial pulses are 2+ on the right side, and 2+ on the left side.       Dorsalis pedis pulses are 2+ on the right side, and 2+ on the left side.  Pulmonary/Chest: Effort normal and breath sounds normal. No respiratory distress. She has no wheezes. She has no rales.  Abdominal: Soft. Bowel sounds are normal. She exhibits no distension. There is tenderness. There is no rebound.    Obese Discomfort upon palpation to bilateral pelvic regions and suprapubic  Genitourinary: Vagina normal. No vaginal discharge found.  Negative swelling, erythema, inflammation, lesions, sores noted to the external genitalia and vaginal canal. Negative blood in the vaginal vault. Negative discharge noted. Mild odor noted. Negative CMT. Mild  bilateral adnexal tenderness.  Pelvic exam chaperoned with tech  Musculoskeletal: Normal range of motion.  Lymphadenopathy:    She has no cervical adenopathy.  Neurological: She is alert and oriented to person, place, and time. She exhibits normal muscle tone. Coordination normal.  Skin: Skin is warm and dry. No rash noted. She is not diaphoretic. No erythema.  Psychiatric: She has a normal mood and affect. Her behavior is normal. Thought content normal.    ED Course  Procedures  (including critical care time)  7:13 PM This provider was at bedside and spoke with the patient regarding labs and imaging in great detail. Discussed with patient the medications that she is to take. Discussed with patient to avoid any alcohol, illicit drug use, and smoking during pregnancy. Discussed with patient importance of following up with [facility] hospital. Discussed with patient signs and symptoms of threatened pregnancy.    Date: 06/15/2013  Rate: 85  Rhythm: normal sinus rhythm  QRS Axis: normal  Intervals: normal  ST/T Wave abnormalities: normal  Conduction Disutrbances:none  Narrative Interpretation:   Old EKG Reviewed: none available EKG analyzed reviewed by this provider and attending physician.  Labs Review Labs Reviewed  WET PREP, GENITAL - Abnormal; Notable for the following:    Clue Cells Wet Prep HPF POC FEW (*)    WBC, Wet Prep HPF POC FEW (*)    All other components within normal limits  HCG, QUANTITATIVE, PREGNANCY - Abnormal; Notable for the following:    hCG, Beta Chain, Quant, [place] [address] (*)    All other components within normal limits  COMPREHENSIVE METABOLIC PANEL - Abnormal; Notable  for the following:    Sodium 133 (*)    Potassium 3.3 (*)    Albumin 3.1 (*)    Total Bilirubin <0.1 (*)    All other components within normal limits  CBC WITH DIFFERENTIAL - Abnormal; Notable for the following:    Platelets 81 (*)    All other components within normal limits  URINALYSIS, ROUTINE W REFLEX MICROSCOPIC - Abnormal; Notable for the following:    APPearance CLOUDY (*)    All other components within normal limits  RAPID STREP SCREEN  GC/CHLAMYDIA PROBE AMP  CULTURE, GROUP A STREP  POCT I-STAT TROPONIN I   Imaging Review US Ob Comp Less 14 Wks  06/15/2013   CLINICAL DATA:  Pelvic pain. Rule out ectopic pregnancy. Quantitative beta HCG is [id number].  EXAM: OBSTETRIC <14 WK ULTRASOUND  TECHNIQUE: Transabdominal ultrasound was performed for evaluation of the  gestation as well as the maternal uterus and adnexal regions.  COMPARISON:  01/28/2013 pelvic ultrasound  FINDINGS: Intrauterine gestational sac: Present  Yolk sac:  Present  Embryo:  Present  Cardiac Activity: Present  Heart Rate: 162 bpm  CRL:   65.4  mm   12 w 6 d                  [place] EDC: 12/22/2013  Maternal uterus/adnexae: No subchorionic hemorrhage. The right ovary is not seen. The left ovary has a normal appearance.  IMPRESSION: 1. Single living intrauterine embryo corresponding to an age of 12 weeks 6 days. 2. EDC is 12/22/2013 by today's exam. 3. No ectopic pregnancy or adnexal mass identified.   Electronically Signed   By: Rosalie Gums M.D.   On: 06/15/2013 18:34    EKG Interpretation     Ventricular Rate:  85 PR Interval:  146 QRS Duration: 85 QT Interval:  332 QTC Calculation: 395 R Axis:   69 Text Interpretation:  Sinus rhythm Low voltage, precordial leads            MDM   1. Bacterial vaginosis   2. Pregnant   3. Abdominal pain     Patient presenting to the ED with cold-like symptoms. Patient reported chest pain localize the center for chest, throbbing.  Patient was seen in the emergency department on 05/26/2013 for similar symptoms. Patient was diagnosed being pregnant. Patient has not been on prenatal vitamins. Reported that she's been experiencing lower, pain for the past 2 days, described as a cramping sensation-denied vaginal bleeding, vaginal discharge, dysuria, hematuria. Reports she's been experiencing a foul odor to the vaginal region. Alert and oriented. Lungs clear to auscultation bilaterally. Heart rate and rhythm normal. Pulses strong and palpable, radial and DP bilaterally. Bowel sounds normal active in all 4 quadrants, soft. Obese. Discomfort upon palpation to bilateral pelvic and suprapubic region. Pelvic exam performed-bilateral adnexal tenderness identified. Negative blood in vaginal vault. Negative discharge identified. EKG negative ischemic findings.  Negative elevation troponin. Urine negative for infection, negative nitrites and leukocytosis identified-negative hemoglobin in urine. CMP noted dehydration-hyponatremia of 133, hypokalemia of 3.3. CBC negative elevation white blood cell count. Beta hCG [id number]. Rapid strep test negative. Few clue cells noted on wet prep. GC Chlamydia probe pending. Ultrasound noted single living intrauterine pregnancy with a gestational age of [redacted] weeks 6 days, [facility] 12/22/2013. Yolk sac present, embryo present, cardiac activity with 162 beats per minute. No ectopic pregnancy or adnexal masses identified. Patient properly hydrated in ED setting, IV fluids and by mouth potassium administered. Patient able to tolerate food  by mouth and drink by mouth. Zofran IV administered. Patient feeling better. Doubt PE. Doubt cardiac issue. Negative findings for ectopic pregnancy. Suspicion to be URI viral vs allergic. Possible beginnings of bacterial vaginosis. Patient stable, afebrile. Discharged patient with zofran, prenatal vitamins, and flagyl. Discussed with patient to keep appointment with [facility] on 06/22/2013. Discussed with patient to rest and stay hydrated. Discussed with patient to closely monitor symptoms and if symptoms are to worsen or change report back to emergency department - strict return instructions given. Patient agreed to plan of care, understood, all questions answered.      Raymon Mutton, PA-C 06/16/13 1441

## 2013-06-16 LAB — GC/CHLAMYDIA PROBE AMP
CT Probe RNA: NEGATIVE
GC Probe RNA: NEGATIVE

## 2013-06-16 NOTE — ED Provider Notes (Signed)
Medical screening examination/treatment/procedure(s) were performed by non-physician practitioner and as supervising physician I was immediately available for consultation/collaboration.  EKG Interpretation     Ventricular Rate:  85 PR Interval:  146 QRS Duration: 85 QT Interval:  332 QTC Calculation: 395 R Axis:   69 Text Interpretation:  Sinus rhythm Low voltage, precordial leads              Dagmar Hait, MD 06/16/13 2311

## 2013-06-17 LAB — CULTURE, GROUP A STREP

## 2013-06-22 ENCOUNTER — Encounter: Payer: Self-pay | Admitting: Obstetrics

## 2013-07-13 ENCOUNTER — Encounter: Payer: Medicaid Other | Admitting: Obstetrics & Gynecology

## 2013-07-14 ENCOUNTER — Encounter (HOSPITAL_COMMUNITY): Payer: Self-pay | Admitting: *Deleted

## 2013-07-14 ENCOUNTER — Inpatient Hospital Stay (HOSPITAL_COMMUNITY)
Admission: AD | Admit: 2013-07-14 | Discharge: 2013-07-14 | Disposition: A | Discharge status: Home / Self Care | Payer: Medicaid Other | Source: Ambulatory Visit | Attending: Family Medicine | Admitting: Family Medicine

## 2013-07-14 DIAGNOSIS — O368190 Decreased fetal movements, unspecified trimester, not applicable or unspecified: Secondary | ICD-10-CM | POA: Insufficient documentation

## 2013-07-14 DIAGNOSIS — O99891 Other specified diseases and conditions complicating pregnancy: Secondary | ICD-10-CM | POA: Insufficient documentation

## 2013-07-14 DIAGNOSIS — M545 Low back pain, unspecified: Secondary | ICD-10-CM | POA: Insufficient documentation

## 2013-07-14 DIAGNOSIS — R21 Rash and other nonspecific skin eruption: Secondary | ICD-10-CM | POA: Insufficient documentation

## 2013-07-14 DIAGNOSIS — R109 Unspecified abdominal pain: Secondary | ICD-10-CM | POA: Insufficient documentation

## 2013-07-14 LAB — URINALYSIS, ROUTINE W REFLEX MICROSCOPIC
Bilirubin Urine: NEGATIVE
Hgb urine dipstick: NEGATIVE
Leukocytes, UA: NEGATIVE
Nitrite: NEGATIVE
Protein, ur: NEGATIVE mg/dL
Urobilinogen, UA: 0.2 mg/dL (ref 0.0–1.0)
pH: 6 (ref 5.0–8.0)

## 2013-07-14 MED ORDER — HYDROCORTISONE 1 % EX CREA: TOPICAL_CREAM | CUTANEOUS | Status: DC

## 2013-07-14 NOTE — MAU Provider Note (Signed)
Chart reviewed and agree with management and plan.  

## 2013-07-14 NOTE — MAU Note (Signed)
Lower abd pain for a week. Feels like have uti. No pain when voids. Has not felt FM for 2 wks. No PNC

## 2013-07-14 NOTE — MAU Provider Note (Signed)
History     CSN: [id number]  Arrival date and time: 07/14/13 1421   First Provider Initiated Contact with Patient 07/14/13 1524      Chief Complaint  Patient presents with  . Abdominal Pain  . Decreased Fetal Movement   HPI Monique Schmidt is a 27 y.o. [phone] at [redacted]w[redacted]d who presents to MAU today with complaint of rash and back pain. The patient states that she has been having occasional lower back pain and lower abdominal pain. She also states that she noticed some bumps on her skin with itching last night. She denies any swelling of the lips or tongue or SOB or difficulty breathing. She states that she took 2 Benadryl last night with out much relief. She denies any pets or anyone else at home with the same symptoms. She states that she has a fairly new mattress. She has not yet started prenatal care. She had an appointment in the clinic yesterday, but wasn't able to go.   OB History   Grav Para Term Preterm Abortions TAB SAB Ect Mult Living   5 3 3  1  1   3       Past Medical History  Diagnosis Date  . Abnormal Pap smear   . Gestational diabetes     all pregnancies glyburide started 11/4  . Thrombocytopenia     Past Surgical History  Procedure Laterality Date  . Wisdom tooth extraction      Family History  Problem Relation Age of Onset  . Hypertension Father   . Diabetes Father   . Diabetes Mother   . Seizures Brother     History  Substance Use Topics  . Smoking status: Never Smoker   . Smokeless tobacco: Never Used  . Alcohol Use: No    Allergies:  Allergies  Allergen Reactions  . Penicillins Itching  . Latex Itching and Rash    Prescriptions prior to admission  Medication Sig Dispense Refill  . acetaminophen (TYLENOL) 500 MG tablet Take 1,000 mg by mouth every 6 (six) hours as needed for moderate pain.      . diphenhydrAMINE (BENADRYL) 25 MG tablet Take 25 mg by mouth 2 (two) times daily.      . Prenatal Vit-Fe Fumarate-FA (PRENATAL COMPLETE)  14-0.4 MG TABS Take 1 tablet by mouth once.  60 each  0    Review of Systems  Gastrointestinal: Positive for abdominal pain. Negative for nausea, vomiting, diarrhea and constipation.  Genitourinary: Negative for dysuria, urgency and frequency.       Neg - vaginal bleeding, discharge  Musculoskeletal: Positive for back pain.  Skin: Positive for itching and rash.   Physical Exam   Blood pressure 96/53, pulse 82, temperature 98.7 F (37.1 C), resp. rate 20, height 5\' 6"  (1.676 m), weight 302 lb 3.2 oz (137.077 kg), last menstrual period 02/20/2013, SpO2 100.00%.  Physical Exam  Constitutional: She is oriented to person, place, and time. She appears well-developed and well-nourished. No distress.  HENT:  Head: Normocephalic and atraumatic.  Cardiovascular: Normal rate.   Respiratory: Effort normal.  GI: Soft. She exhibits no distension and no mass. There is no tenderness. There is no rebound and no guarding.  Neurological: She is alert and oriented to person, place, and time.  Skin: Skin is warm and dry. Rash noted. No erythema.  Multiple 0.5 cm raised macules with significant excoriations on the face, chest and arms. No active drainage.   Psychiatric: She has a normal mood and affect.  Results for orders placed during the hospital encounter of 07/14/13 (from the past 24 hour(s))  URINALYSIS, ROUTINE W REFLEX MICROSCOPIC     Status: Abnormal   Collection Time    07/14/13  2:49 PM      Result Value Range   Color, Urine YELLOW  YELLOW   APPearance HAZY (*) CLEAR   Specific Gravity, Urine >1.030 (*) 1.005 - 1.030   pH 6.0  5.0 - 8.0   Glucose, UA NEGATIVE  NEGATIVE mg/dL   Hgb urine dipstick NEGATIVE  NEGATIVE   Bilirubin Urine NEGATIVE  NEGATIVE   Ketones, ur NEGATIVE  NEGATIVE mg/dL   Protein, ur NEGATIVE  NEGATIVE mg/dL   Urobilinogen, UA 0.2  0.0 - 1.0 mg/dL   Nitrite NEGATIVE  NEGATIVE   Leukocytes, UA NEGATIVE  NEGATIVE    MAU Course  Procedures None  MDM Discussed  patient with Dr. Shawnie Pons. She came to MAU to see the patient's rash. She recommends Benadryl, hydrocortisone cream and re-washing all bed sheets.   Assessment and Plan  A: Rash Round ligament pain  P: Discharge home Rx for Hydrocortisone cream sent to patient's pharmacy Patient advised to take Zyrtec and Benadryl over-the-counter Patient advised to take Tylenol PRN for pain Patient advised to call [facility] clinic to reschedule appointment to start prenatal care Patient may return to MAU as needed or if her condition were to change or worsen  Monique Starr, PA-C  07/14/2013, 3:24 PM

## 2013-07-14 NOTE — MAU Note (Signed)
fht obtained 150-157. Patient is in with c/o new onset of generalized rash and itching (denies having any new products or eaten anything new). She states that she noticed the rash yesterday and it have gotten worse. The rash is red, non-draining throughout her body, face included. She also c/o llq constant pain (patient states that she had that type of pain when she had UTI in the past). She denies vaginal bleeding or abnormal vaginal discharge. Patient states that she missed her first appt at the [facility] clinic yesterday.

## 2013-07-23 ENCOUNTER — Ambulatory Visit (INDEPENDENT_AMBULATORY_CARE_PROVIDER_SITE_OTHER): Payer: Medicaid Other | Admitting: Obstetrics & Gynecology

## 2013-07-23 ENCOUNTER — Encounter: Payer: Self-pay | Admitting: Obstetrics & Gynecology

## 2013-07-23 ENCOUNTER — Other Ambulatory Visit (HOSPITAL_COMMUNITY)
Admission: RE | Admit: 2013-07-23 | Discharge: 2013-07-23 | Disposition: A | Discharge status: Home / Self Care | Payer: Medicaid Other | Source: Ambulatory Visit | Attending: Obstetrics & Gynecology | Admitting: Obstetrics & Gynecology

## 2013-07-23 VITALS — BP 105/61 | Temp 97.6°F | Ht 65.0 in | Wt 301.5 lb

## 2013-07-23 DIAGNOSIS — O24919 Unspecified diabetes mellitus in pregnancy, unspecified trimester: Secondary | ICD-10-CM

## 2013-07-23 DIAGNOSIS — O26892 Other specified pregnancy related conditions, second trimester: Secondary | ICD-10-CM

## 2013-07-23 DIAGNOSIS — O24912 Unspecified diabetes mellitus in pregnancy, second trimester: Secondary | ICD-10-CM

## 2013-07-23 DIAGNOSIS — O0992 Supervision of high risk pregnancy, unspecified, second trimester: Secondary | ICD-10-CM

## 2013-07-23 DIAGNOSIS — Z01419 Encounter for gynecological examination (general) (routine) without abnormal findings: Secondary | ICD-10-CM | POA: Insufficient documentation

## 2013-07-23 DIAGNOSIS — O26899 Other specified pregnancy related conditions, unspecified trimester: Secondary | ICD-10-CM | POA: Insufficient documentation

## 2013-07-23 DIAGNOSIS — O9989 Other specified diseases and conditions complicating pregnancy, childbirth and the puerperium: Secondary | ICD-10-CM

## 2013-07-23 DIAGNOSIS — Z113 Encounter for screening for infections with a predominantly sexual mode of transmission: Secondary | ICD-10-CM | POA: Insufficient documentation

## 2013-07-23 LAB — POCT URINALYSIS DIP (DEVICE)
Glucose, UA: NEGATIVE mg/dL
Hgb urine dipstick: NEGATIVE
Ketones, ur: NEGATIVE mg/dL
Leukocytes, UA: NEGATIVE
Nitrite: NEGATIVE
Urobilinogen, UA: 0.2 mg/dL (ref 0.0–1.0)

## 2013-07-23 NOTE — Progress Notes (Signed)
U/S scheduled for 07/28/13 at 330 pm.ML for [facility] office to call for an appointment with Remonia Richter.

## 2013-07-23 NOTE — Patient Instructions (Signed)
Migraine Headache A migraine headache is an intense, throbbing pain on one or both sides of your head. A migraine can last for 30 minutes to several hours. CAUSES  The exact cause of a migraine headache is not always known. However, a migraine may be caused when nerves in the brain become irritated and release chemicals that cause inflammation. This causes pain. SYMPTOMS  Pain on one or both sides of your head.  Pulsating or throbbing pain.  Severe pain that prevents daily activities.  Pain that is aggravated by any physical activity.  Nausea, vomiting, or both.  Dizziness.  Pain with exposure to bright lights, loud noises, or activity.  General sensitivity to bright lights, loud noises, or smells. Before you get a migraine, you may get warning signs that a migraine is coming (aura). An aura may include:  Seeing flashing lights.  Seeing bright spots, halos, or zig-zag lines.  Having tunnel vision or blurred vision.  Having feelings of numbness or tingling.  Having trouble talking.  Having muscle weakness. MIGRAINE TRIGGERS  Alcohol.  Smoking.  Stress.  Menstruation.  Aged cheeses.  Foods or drinks that contain nitrates, glutamate, aspartame, or tyramine.  Lack of sleep.  Chocolate.  Caffeine.  Hunger.  Physical exertion.  Fatigue.  Medicines used to treat chest pain (nitroglycerine), birth control pills, estrogen, and some blood pressure medicines. DIAGNOSIS  A migraine headache is often diagnosed based on:  Symptoms.  Physical examination.  A CT scan or MRI of your head. TREATMENT Medicines may be given for pain and nausea. Medicines can also be given to help prevent recurrent migraines.  HOME CARE INSTRUCTIONS  Only take over-the-counter or prescription medicines for pain or discomfort as directed by your caregiver. The use of long-term narcotics is not recommended.  Lie down in a dark, quiet room when you have a migraine.  Keep a journal  to find out what may trigger your migraine headaches. For example, write down:  What you eat and drink.  How much sleep you get.  Any change to your diet or medicines.  Limit alcohol consumption.  Quit smoking if you smoke.  Get 7 to 9 hours of sleep, or as recommended by your caregiver.  Limit stress.  Keep lights dim if bright lights bother you and make your migraines worse. SEEK IMMEDIATE MEDICAL CARE IF:   Your migraine becomes severe.  You have a fever.  You have a stiff neck.  You have vision loss.  You have muscular weakness or loss of muscle control.  You start losing your balance or have trouble walking.  You feel faint or pass out.  You have severe symptoms that are different from your first symptoms. MAKE SURE YOU:   Understand these instructions.  Will watch your condition.  Will get help right away if you are not doing well or get worse. Document Released: 08/06/2005 Document Revised: 10/29/2011 Document Reviewed: 07/27/2011 [employer] Patient Information 2014 [employer].  

## 2013-07-23 NOTE — Progress Notes (Signed)
P=96 Discharge with odor. Initial prenatal visit with early glucola

## 2013-07-23 NOTE — Progress Notes (Signed)
   Subjective: new OB visit    Monique Schmidt is a [id number] [redacted]w[redacted]d being seen today for her first obstetrical visit.  Her obstetrical history is significant for Gestational diabetes and obesity, headaches. Patient does intend to breast feed. Pregnancy history fully reviewed.  Patient reports headache.  Filed Vitals:   07/23/13 0838 07/23/13 0843  BP: 105/61   Temp: 97.6 F (36.4 C)   Height:  5\' 5"  (1.651 m)  Weight: 301 lb 8 oz (136.76 kg)     HISTORY: OB History  Gravida Para Term Preterm AB SAB TAB Ectopic Multiple Living  5 3 3  1 1    3     # Outcome Date GA Lbr Len/2nd Weight Sex Delivery Anes PTL Lv  5 CUR           4 TRM 06/26/12 [redacted]w[redacted]d 21:07 / 00:01 7 lb 2 oz (3.232 kg) F SVD None  Y  3 TRM 03/08/11 [redacted]w[redacted]d 01:27 / 00:02 8 lb 9 oz (3.884 kg) F SVD None  Y     Comments: none  2 TRM 05/19/09 [redacted]w[redacted]d   F SVD None N Y  1 SAB              Past Medical History  Diagnosis Date  . Abnormal Pap smear   . Gestational diabetes     all pregnancies glyburide started 11/4  . Thrombocytopenia    Past Surgical History  Procedure Laterality Date  . Wisdom tooth extraction     Family History  Problem Relation Age of Onset  . Hypertension Father   . Diabetes Father   . Diabetes Mother   . Seizures Brother      Exam    Uterus:     Pelvic Exam:    Perineum: No Hemorrhoids   Vulva: normal   Vagina:  normal discharge   pH:     Cervix: no lesions   Adnexa: not evaluated   Bony Pelvis: gynecoid  System: Breast:  normal appearance, no masses or tenderness, large, pendelous    Skin: normal coloration and turgor, no rashes    Neurologic: oriented, normal mood   Extremities: normal strength, tone, and muscle mass   HEENT PERRLA and thyroid without masses   Mouth/Teeth dental hygiene good   Neck supple   Cardiovascular: regular rate and rhythm   Respiratory:  appears well, vitals normal, no respiratory distress, acyanotic, normal RR, neck free of mass or lymphadenopathy,  chest clear, no wheezing, crepitations, rhonchi, normal symmetric air entry   Abdomen: obese, pannus   Urinary: urethral meatus normal      Assessment:    Pregnancy: [id number] Patient Active Problem List   Diagnosis Date Noted  . Headache in pregnancy, antepartum 07/23/2013  . Supervision of high-risk pregnancy 06/16/2012  . Diabetes mellitus, antepartum 06/16/2012  . Diabetes mellitus 04/16/2011  . Low grade squamous intraepithelial lesion on cytologic smear of cervix (lgsil) 04/16/2011        Plan:     Initial labs drawn. Prenatal vitamins. Problem list reviewed and updated. Genetic Screening discussed too late  Ultrasound discussed; fetal survey: ordered.  Follow up in 2 weeks. 50% of 30 min visit spent on counseling and coordination of care.  See Monique Schmidt to evaluate headache   Monique Schmidt 07/23/2013

## 2013-07-23 NOTE — Progress Notes (Signed)
Nutrition note: 1st visit consult Pt has h/o obesity and has had GDM in previous pregnancies. Pt has gained 3.5# @ [redacted]w[redacted]d, which is wnl.  Pt reports eating 3 meals & 3 snacks/d. Pt reports having heartburn but no N/V. Pt is taking PNV. Pt received verbal & written education on general nutrition during pregnancy. Discussed healthy/ safe fish consumption during pregnancy. Discussed wt gain goals of 11-20# or 0.5#/wk. Pt agrees to continue taking PNV. Pt does not have WIC but plans to apply. Pt plans to BF. F/u if referred Blondell Reveal, MS, RD, LDN

## 2013-07-24 LAB — OBSTETRIC PANEL
Antibody Screen: NEGATIVE
Basophils Absolute: 0 10*3/uL (ref 0.0–0.1)
Basophils Relative: 0 % (ref 0–1)
Eosinophils Relative: 4 % (ref 0–5)
HCT: 34.6 % — ABNORMAL LOW (ref 36.0–46.0)
MCHC: 34.4 g/dL (ref 30.0–36.0)
Monocytes Absolute: 0.3 10*3/uL (ref 0.1–1.0)
Monocytes Relative: 4 % (ref 3–12)
Neutro Abs: 6 10*3/uL (ref 1.7–7.7)
RDW: 15.5 % (ref 11.5–15.5)
WBC: 8.4 10*3/uL (ref 4.0–10.5)

## 2013-07-24 LAB — PRESCRIPTION MONITORING PROFILE (19 PANEL)
Amphetamine/Meth: NEGATIVE ng/mL
Barbiturate Screen, Urine: NEGATIVE ng/mL
Benzodiazepine Screen, Urine: NEGATIVE ng/mL
Buprenorphine, Urine: NEGATIVE ng/mL
Cannabinoid Scrn, Ur: NEGATIVE ng/mL
Fentanyl, Ur: NEGATIVE ng/mL
Methaqualone: NEGATIVE ng/mL
Opiate Screen, Urine: NEGATIVE ng/mL
Phencyclidine, Ur: NEGATIVE ng/mL
Tapentadol, urine: NEGATIVE ng/mL
Zolpidem, Urine: NEGATIVE ng/mL

## 2013-07-24 LAB — CULTURE, OB URINE: Organism ID, Bacteria: NO GROWTH

## 2013-07-24 LAB — HIV ANTIBODY (ROUTINE TESTING W REFLEX): HIV: NONREACTIVE

## 2013-07-27 ENCOUNTER — Encounter: Payer: Medicaid Other | Admitting: Nurse Practitioner

## 2013-07-27 ENCOUNTER — Telehealth: Payer: Self-pay | Admitting: *Deleted

## 2013-07-27 LAB — HEMOGLOBINOPATHY EVALUATION
Hgb A2 Quant: 2.8 % (ref 2.2–3.2)
Hgb A: 97.2 % (ref 96.8–97.8)
Hgb F Quant: 0 % (ref 0.0–2.0)

## 2013-07-27 NOTE — Telephone Encounter (Signed)
Pt called the front desk and pt wanted to know her test results cause "I have a boil and I want to know if I have an infection."  I asked pt what test results.  Pt stated "that I had left a urine".  I informed pt that her urine came back normal so she does not have a UTI.  Pt then starts talking about how she is itching all over and that she went to the hospital and they did not do anything for it.  I advised pt to take benadryl.  Pt stated "I have taken Benadryl and as soon as it wears off I start itching again."  I advised pt to continue to take benadryl as instructed on directions and when she comes in to her appt scheduled on Monday they will be able to further assess her itching.   Somehow the call was disconnected.  Attempted call back pt and was unable to leave message.

## 2013-07-27 NOTE — Telephone Encounter (Signed)
Pt called nurse line requesting test results and request a prescription for vitamins.

## 2013-07-28 ENCOUNTER — Ambulatory Visit (HOSPITAL_COMMUNITY): Admission: RE | Admit: 2013-07-28 | Payer: Medicaid Other | Source: Ambulatory Visit

## 2013-07-30 ENCOUNTER — Ambulatory Visit (HOSPITAL_COMMUNITY)
Admission: RE | Admit: 2013-07-30 | Discharge: 2013-07-30 | Disposition: A | Discharge status: Home / Self Care | Payer: Medicaid Other | Source: Ambulatory Visit | Attending: Obstetrics & Gynecology | Admitting: Obstetrics & Gynecology

## 2013-07-30 DIAGNOSIS — O358XX0 Maternal care for other (suspected) fetal abnormality and damage, not applicable or unspecified: Secondary | ICD-10-CM | POA: Insufficient documentation

## 2013-07-30 DIAGNOSIS — E669 Obesity, unspecified: Secondary | ICD-10-CM | POA: Insufficient documentation

## 2013-07-30 DIAGNOSIS — O09299 Supervision of pregnancy with other poor reproductive or obstetric history, unspecified trimester: Secondary | ICD-10-CM | POA: Insufficient documentation

## 2013-07-30 DIAGNOSIS — O0992 Supervision of high risk pregnancy, unspecified, second trimester: Secondary | ICD-10-CM

## 2013-08-03 ENCOUNTER — Ambulatory Visit (INDEPENDENT_AMBULATORY_CARE_PROVIDER_SITE_OTHER): Payer: Medicaid Other | Admitting: Obstetrics & Gynecology

## 2013-08-03 VITALS — BP 103/61 | Temp 97.8°F | Wt 303.0 lb

## 2013-08-03 DIAGNOSIS — O9989 Other specified diseases and conditions complicating pregnancy, childbirth and the puerperium: Secondary | ICD-10-CM

## 2013-08-03 DIAGNOSIS — O0992 Supervision of high risk pregnancy, unspecified, second trimester: Secondary | ICD-10-CM

## 2013-08-03 DIAGNOSIS — Z23 Encounter for immunization: Secondary | ICD-10-CM

## 2013-08-03 DIAGNOSIS — R062 Wheezing: Secondary | ICD-10-CM

## 2013-08-03 DIAGNOSIS — N898 Other specified noninflammatory disorders of vagina: Secondary | ICD-10-CM

## 2013-08-03 DIAGNOSIS — R87612 Low grade squamous intraepithelial lesion on cytologic smear of cervix (LGSIL): Secondary | ICD-10-CM

## 2013-08-03 DIAGNOSIS — D689 Coagulation defect, unspecified: Secondary | ICD-10-CM

## 2013-08-03 DIAGNOSIS — D696 Thrombocytopenia, unspecified: Secondary | ICD-10-CM | POA: Insufficient documentation

## 2013-08-03 LAB — POCT URINALYSIS DIP (DEVICE)
Bilirubin Urine: NEGATIVE
Glucose, UA: NEGATIVE mg/dL
Hgb urine dipstick: NEGATIVE
Leukocytes, UA: NEGATIVE
Nitrite: NEGATIVE
Specific Gravity, Urine: >=1.03 (ref 1.005–1.030)
Urobilinogen, UA: 0.2 mg/dL (ref 0.0–1.0)
pH: 7 (ref 5.0–8.0)

## 2013-08-03 LAB — TSH: TSH: 0.029 u[IU]/mL — ABNORMAL LOW (ref 0.350–4.500)

## 2013-08-03 MED ORDER — AZITHROMYCIN 250 MG PO TABS: ORAL_TABLET | ORAL | Status: DC

## 2013-08-03 MED ORDER — ALBUTEROL SULFATE HFA 108 (90 BASE) MCG/ACT IN AERS: 2.0000 | INHALATION_SPRAY | Freq: Four times a day (QID) | RESPIRATORY_TRACT | Status: DC | PRN

## 2013-08-03 MED ORDER — CETIRIZINE HCL 10 MG PO TABS: 10.0000 mg | ORAL_TABLET | Freq: Every day | ORAL | Status: DC

## 2013-08-03 NOTE — Progress Notes (Signed)
P=93  Pt c/o of smelly discharge.  Pt c/o of night sweats.

## 2013-08-03 NOTE — Progress Notes (Signed)
U/S scheduled 08/25/13 at 1015 am. Patient referred to [facility] to see Dr. Reola Calkins. [facility] will call patient and ML at home # to advise of this. Patient has contact information for [facility].

## 2013-08-03 NOTE — Progress Notes (Signed)
Nml anatomy.  Needs f/u views for heart and CI.  Failed early 1 hour GCT.  Needs 3 hr.  AFP tumor marker sent rather than quad screen.  Will resend today. Unable to palpate fundus due to habitus.  FH difficult to obtain (under pannus on the left). Pt c/o productive cough, wheezing for 3 months.  Pt has not had relief from Claritin.  Pt has been prescribed an inhaler in the past but does not know if she has asthma.  Needs referral to [facility]. End expiratory wheezes on lung exam. Z pack, zyrtec, and albuterol inhaler given. Pt c/o vag odor--apocrine gland odor appreciated during exam.  BD affirm sent.

## 2013-08-04 ENCOUNTER — Encounter: Payer: Self-pay | Admitting: Obstetrics & Gynecology

## 2013-08-04 ENCOUNTER — Other Ambulatory Visit: Payer: Self-pay | Admitting: Obstetrics & Gynecology

## 2013-08-04 DIAGNOSIS — E038 Other specified hypothyroidism: Secondary | ICD-10-CM

## 2013-08-04 LAB — AFP, QUAD SCREEN
Age Alone: 1:765 {titer}
HCG, Total: 15756 m[IU]/mL
Interpretation-AFP: NEGATIVE
MoM for AFP: 1.46
MoM for hCG: 1.63
Open Spina bifida: NEGATIVE
Tri 18 Scr Risk Est: NEGATIVE
uE3 Mom: 0.69
uE3 Value: 0.7 ng/mL

## 2013-08-07 ENCOUNTER — Ambulatory Visit: Payer: Medicaid Other | Admitting: Family Medicine

## 2013-08-07 ENCOUNTER — Encounter: Payer: Self-pay | Admitting: Family Medicine

## 2013-08-07 ENCOUNTER — Ambulatory Visit (INDEPENDENT_AMBULATORY_CARE_PROVIDER_SITE_OTHER): Payer: Medicaid Other | Admitting: Family Medicine

## 2013-08-07 VITALS — BP 139/84 | HR 90 | Temp 98.3°F | Wt 307.0 lb

## 2013-08-07 DIAGNOSIS — J45909 Unspecified asthma, uncomplicated: Secondary | ICD-10-CM

## 2013-08-07 DIAGNOSIS — J4540 Moderate persistent asthma, uncomplicated: Secondary | ICD-10-CM | POA: Insufficient documentation

## 2013-08-07 HISTORY — DX: Moderate persistent asthma, uncomplicated: J45.40

## 2013-08-07 MED ORDER — BECLOMETHASONE DIPROPIONATE 40 MCG/ACT IN AERS: 2.0000 | INHALATION_SPRAY | Freq: Two times a day (BID) | RESPIRATORY_TRACT | Status: DC

## 2013-08-07 NOTE — Progress Notes (Signed)
Patient ID: Monique Schmidt, female   DOB: 1986-05-29, 29 y.o.   MRN: [id number] FAMILY MEDICINE OFFICE NOTE  Chief Complaint:  R/o asthma  Primary Care Physician: Default, Provider, MD  HPI:  Monique Schmidt is a 27 yo [phone] @[redacted]weeks  gestation who presents for wheezing and SOB as a new patient at the request of Dr. Penne Lash.   - has been having difficulty with breathing ever since October - no cold or inciting event - has had wheezing in all her pregnancies that was treated with albuterol inhaler but never carried a diagnosis of asthma - no wheezing in between  - now when she lays down or walks she has wheezing - when she comes up the stairs any day she has wheezing - wakes her up almost nightly since October.   Triggers: cigarrette smoke and exercise  No fevers, chills, cough, chest pain, nausea, vomiting, diarrhea.   - also has sinus congestion during her pregnancy.  Occasional sweats  Was seen by Dr. Penne Lash on 12/15 and given zpack, zyrtec and albuterol inhaler but has not filled them yet. Planning on going today.   Pregnancy going well- +FM, no vb, lof, ctx   PMHx:  Past Medical History  Diagnosis Date  . Abnormal Pap smear   . Gestational diabetes     all pregnancies glyburide started 11/4  . Thrombocytopenia     Past Surgical History  Procedure Laterality Date  . Wisdom tooth extraction      FAMHx:  Family History  Problem Relation Age of Onset  . Hypertension Father   . Diabetes Father   . Diabetes Mother   . Seizures Brother     SOCHx:   reports that she has never smoked. She has never used smokeless tobacco. She reports that she does not drink alcohol or use illicit drugs.  ALLERGIES:  Allergies  Allergen Reactions  . Penicillins Itching  . Latex Itching and Rash    ROS: Pertinent ROS as seen in HPI. Otherwise negative.   HOME MEDS: Current Outpatient Prescriptions  Medication Sig Dispense Refill  . acetaminophen (TYLENOL) 500 MG  tablet Take 1,000 mg by mouth every 6 (six) hours as needed for moderate pain.      Marland Kitchen albuterol (PROVENTIL HFA;VENTOLIN HFA) 108 (90 BASE) MCG/ACT inhaler Inhale 2 puffs into the lungs every 6 (six) hours as needed for wheezing or shortness of breath.  1 Inhaler  2  . azithromycin (ZITHROMAX Z-PAK) 250 MG tablet Take 2 tablets the first day then one tablet each day until pack is finished.  6 each  0  . beclomethasone (QVAR) 40 MCG/ACT inhaler Inhale 2 puffs into the lungs 2 (two) times daily.  1 Inhaler  12  . cetirizine (ZYRTEC) 10 MG tablet Take 1 tablet (10 mg total) by mouth daily.  30 tablet  3  . diphenhydrAMINE (BENADRYL) 25 MG tablet Take 25 mg by mouth 2 (two) times daily.      . hydrocortisone cream 1 % Apply to affected area 2 times daily  15 g  0  . Prenatal Vit-Fe Fumarate-FA (PRENATAL COMPLETE) 14-0.4 MG TABS Take 1 tablet by mouth once.  60 each  0   No current facility-administered medications for this visit.    LABS/IMAGING: No results found for this or any previous visit (from the past 48 hour(s)). No results found.  VITALS: BP 139/84  Pulse 90  Temp(Src) 98.3 F (36.8 C) (Oral)  Wt 307 lb (139.254 kg)  SpO2 100%  LMP 02/20/2013  EXAM: Gen: NAD, well appearing, obese HEENT: oropharynx clear, pupils equal and reactive PULM: slightly prolonged expiration, end expiratory wheezing noted. No rhonchi, rales. Good air movement CV: RRR, no murmurs ABD: soft, NT, ND. Gravid EXT: 2+ DP pulses, no edema  PEAK FLOWS 220, 210, 270   ASSESSMENT: Moderate persistent asthma  PLAN: See assessment and plan section

## 2013-08-07 NOTE — Patient Instructions (Signed)
Asthma and Asthma Action Plan, Adult Asthma is a condition that affects your lungs. It is characterized by swelling and narrowing of your airways as well as increased mucus production. The narrowing comes from swelling and muscle spasms inside the airways. When this happens, breathing can be difficult and you can have coughing, wheezing, and shortness of breath. Knowing more about asthma can help you manage it better. Asthma cannot be cured, but medicines and lifestyle changes can help control it. Asthma can be a minor problem for some people but if it is not controlled it can lead to a life-threatening asthma attack. Asthma can change over time. It is important to work with your caregiver to manage your asthma symptoms.  CAUSES  The exact cause of asthma is unknown. Asthma is believed to be caused by inherited (genetic) and environmental exposures. Swelling and redness (inflammation) of the airways occurs in asthma. This can be triggered by allergies, viral lung infections, or irritants in the air. Allergic reactions can cause you to wheeze immediately or several hours after an exposure. Asthma triggers are different for each person. It is important to pay attention and know what triggers your asthma. Common triggers for asthma attacks include:  Animal dander from the skin, hair, or feathers of animals.  Dust mites contained in house dust.  Cockroaches.  Pollen from trees or grass.  Mold.  Cigarette or tobacco smoke. Smoking cannot be allowed in homes of people with asthma. People with asthma should not smoke and should not be around smokers.  Air pollutants such as dust, household cleaners, hair sprays, aerosol sprays, paint fumes, strong chemicals, or strong odors.  Cold air or weather changes. Cold air may cause inflammation. Winds increase molds and pollens in the air. There is not one best climate for people with asthma.  Strong emotions such as crying or laughing  hard.  Stress.  Certain medicines such as aspirin or beta-blockers.  Sulfites in such foods and drinks as dried fruits and wine.  Infections or inflammatory conditions such as the flu, a cold, or an inflammation of the nasal membranes (rhinitis).  Gastroesophageal reflux disease (GERD). GERD is a condition where stomach acid backs up into your throat (esophagus).  Exercise or strenous activity. Proper pre-exercise medicines allow most people to participate in sports. SYMPTOMS   Feeling short of breath.  Chest tightness or pain.  Difficulty sleeping due to coughing, wheezing, or feeling short of breath.  A whistling or wheezing sound with exhalation.  Coughing or wheezing that is worse when you.  Have a virus (such as a cold or the flu).  Are suffering from allergies.  Are exposed to certain fumes or chemicals.  Exercise. Signs that your asthma is probably getting worse include:   More frequent and bothersome asthma signs and symptoms.  Increasing difficulty breathing. This can be measured by a peak flow meter, which is a simple device used to check how well your lungs are working.  An increasingly frequent need to use a quick-relief inhaler. DIAGNOSIS  The diagnosis of asthma is made by review of your medical history, a physical exam, and possibly from other tests. Lung function studies may help with the diagnosis. TREATMENT  Asthma cannot be cured. However, for the majority of adults, asthma can be controlled with treatment. Besides avoidance of triggers of your asthma, medicines are often required. There are 2 classes of medicine used for asthma treatment: controller medicines (reduce inflammation and symptoms) andreliever or rescue medicines (relieve asthma symptoms during  acute attacks). You may require daily medicines to control your asthma. The most effective long-term controller medicines for asthma are inhaled corticosteroids (blocks inflammation). Other long-term  control medicines include:  Leukotriene receptor antagonists (blocks a pathway of inflammation).  Long-acting beta2-agonists (relaxes the muscles of the airways for at least 12 hours) with an inhaled corticosteroid.  Cromolyn sodium or nedocromil (alters certain inflammatory cells' ability to release chemicals that cause inflammation).  Immunomodulators (alters the immune system to prevent asthma symptoms).  Theophylline (relaxes muscles in the airways). You may also require a short-acting beta2-agonist to relieve asthma symptoms during an acute attack. You should understand what to do during an acute attack. Inhaled medicines are effective when used properly. Read the instructions on how to use your medicines correctly and speak to your caregiver if you have questions. Follow up with your caregiver on a regular basis to make sure your asthma is well-controlled. If your asthma is not well-controlled, if you have been hospitalized for asthma, or if multiple medicines or medium to high doses of inhaled corticosteroids are needed to control your asthma, request a referral to an asthma specialist. HOME CARE INSTRUCTIONS   Take medicines as directed by your caregiver.  Control your home environment in the following ways to help prevent asthma attacks:  Change your heating and air conditioning filter at least once a month.  Place a filter or cheesecloth over your heating and air conditioning vents.  Limit the use of fireplaces and wood stoves.  Do not smoke. Do not stay in places where others are smoking.  Get rid of pests (such as roaches and mice) and their droppings.  If you see mold on a plant, throw it away.  Clean your floors and dust every week. Use unscented cleaning products. Use a vacuum cleaner with a HEPA filter if possible. If vacuuming or cleaning triggers your asthma, try to find someone else to do these chores.  Floors in your house should be wood, tile, or vinyl. Carpet  can trap dander and dust.  Use allergy-proof pillows, mattress covers, and box spring covers.  Wash bedsheets and blankets every week in hot water and dry in a dryer.  Use a blanket that is made of polyester or cotton with a tight nap.  Do not use a dust ruffle on your bed.  Clean bathrooms and kitchens with bleach and repaint with mold-resistant paint.  Wash hands frequently.  Talk to your caregiver about an action plan for managing asthma attacks. This includes the use of a peak flow meter which measures the severity of the attack and medicines that can help stop the attack. An action plan can help minimize or stop the attack without having to seek medical care.  Remain calm during an asthma attack.  Always have a plan prepared for seeking medical attention. This should include contacting your caregiver and in the case of a severe attack, calling your local emergency services 911 in [place] SEEK MEDICAL CARE IF:   You have wheezing, shortness of breath, or a cough even if taking medicine to prevent attacks.  You have thickening of sputum.  Your sputum changes from clear or white to yellow, green, gray, or bloody.  You have any problems that may be related to the medicines you are taking (such as a rash, itching, swelling, or trouble breathing).  You are using a reliever medicine more than 2 3 times per week.  Your peak flow is still at 50 79% of personal best after  following your action plan for 1 hour. SEEK IMMEDIATE MEDICAL CARE IF:   You are short of breath even at rest.  You get short of breath when doing very little physical activity.  You have difficulty eating, drinking, or talking due to asthma symptoms.  You have chest pain or you feel that your heart is beating fast.  You have a bluish color to your lips or fingernails.  You are lightheaded, dizzy, or faint.  You have a fever or persistent symptoms for more than 2 3 days.  You have a fever and symptoms  suddenly get worse.  You seem to be getting worse and are unresponsive to treatment during an asthma attack.  Your peak flow is less than 50% of personal best.   ASTHMA ACTION PLAN, ADULT Patient Name: __________________________________________________ Date: ________ Follow-up appointment with physician:   Physician Name: ____________________  Telephone: ____________________  Follow-up recommendation: ____________________ Always take all of your medicines to all of your appointments. POSSIBLE TRIGGERS  Animal dander from the skin, hair, or feathers of animals.  Dust mites contained in house dust.  Cockroaches.  Pollen from trees or grass.  Mold.  Cigarette or tobacco smoke.  Air pollutants such as dust, household cleaners, hair sprays, aerosol sprays, paint fumes, strong chemicals, or strong odors.  Cold air or weather changes. Cold air may cause inflammation. Winds increase molds and pollens in the air.  Strong emotions such as crying or laughing hard.  Stress.  Certain medicines such as aspirin or beta-blockers.  Sulfites in such foods and drinks as dried fruits and wine.  Infections or inflammatory conditions such as a flu, cold, or inflammation of the nasal membranes (rhinitis).  Gastroesophageal reflux disease (GERD). GERD is a condition where stomach acid backs up into your throat (esophagus).  Exercise or strenous activity. WHEN WELL: ASTHMA UNDER CONTROL Symptoms: No cough or wheezing, chest tightness, or shortness of breath either during the day or at night; can participate in usual activities. If using a peak flow meter: My optimal peak flow is: _____ to _____ (should be 80 100% of personal best) Medicines: Every day:  Controller: ________________ How much? ________________ When? ________________  Controller: ________________ How much? ________________ When? ________________ Before exercise:  Reliever: ________________ How much? ________________  When? ________________ If symptoms are noted:  Reliever/Rescue: ________________ How much? ________________ When? ________________ Call your physician if using a reliever more than 2 3 times per week. WHEN NOT WELL: ASTHMA GETTING WORSE Symptoms: Cough, wheeze, shortness of breath, chest tightness, waking at night due to asthma, unable to participate in all of usual activities. If using a peak flow meter: My peak flow is: _____ to _____ (50-79% of personal best) Add the following medicine to those used daily:  Reliever/Rescue: ________________ How much? ________________ When? ________________ If symptoms and peak flow return to GREEN ZONE after 1 hour of above treatment, continue monitoring to make sure you remain in green zone. If symptoms and peak flow DO NOT return to GREEN ZONE after 1 hour of above treatment:  Reliever/Rescue: ________________ How much? ________________ When? ________________  Oral Steroids: ________________ How much? ________________ When? ________________  Call your physician if: ________________________________________________________________ IF SYMPTOMS GET WORSE: ASTHMA IS SEVERE  GET HELP NOW! Symptoms: Severely short of breath, rescue meds have not helped, cannot participate in usual activities, you are having trouble walking or talking due to asthma symptoms, you are dizzy or faint, your fingernails or lips are bluish, your symptoms are the same or worse after 24  hours in Yellow Caution Zone. If using a peak flow meter: My peak flow is: less than _____ (50% of personal best) Add the following medicine to those used daily:  Reliever/Rescue: ________________ How much? ________________ When? ________________  Oral Steroids: ________________ How much? ________________ When? ________________  CALL YOUR PHYSICIAN IMMEDIATELY. If you are in the red danger zone and cannot reach your physician immediately, call your local emergency services (911 in [place] without  delay. If emergency services are far away and will take a long time to get to you, have someone drive you directly to a hospital emergency department or meet emergency services en route to you. Document Released: 06/03/2009 Document Revised: 07/23/2012 Document Reviewed: 05/02/2011 [facility] Patient Information 2014 [place], [place].

## 2013-08-07 NOTE — Assessment & Plan Note (Signed)
Suspect given hx and exam that pt does have an underlying diagnosis of asthma.  Currently not controlled and thus not appropriate to further diagnose with formal PFTs.  - Peak flows severely limited to <40% of expected but in part due to poor effort - given amount of nighttime symptoms, suspect more of a moderate persistent asthma currently - rx of QVAR given with instructions for use - urged to fill prescriptions from Dr. Penne Lash and to take as directed - rx albuterol for prn use - asthma action plan filled out and given.  - plan to f/u in 2 weeks for repeat visit and to re-assess symptoms.

## 2013-08-11 ENCOUNTER — Institutional Professional Consult (permissible substitution): Payer: Medicaid Other | Admitting: Nurse Practitioner

## 2013-08-13 ENCOUNTER — Encounter (HOSPITAL_COMMUNITY): Payer: Self-pay | Admitting: *Deleted

## 2013-08-13 ENCOUNTER — Inpatient Hospital Stay (HOSPITAL_COMMUNITY)
Admission: AD | Admit: 2013-08-13 | Discharge: 2013-08-13 | Disposition: A | Discharge status: Home / Self Care | Payer: Medicaid Other | Source: Ambulatory Visit | Attending: Obstetrics & Gynecology | Admitting: Obstetrics & Gynecology

## 2013-08-13 DIAGNOSIS — O368190 Decreased fetal movements, unspecified trimester, not applicable or unspecified: Secondary | ICD-10-CM | POA: Insufficient documentation

## 2013-08-13 DIAGNOSIS — O99891 Other specified diseases and conditions complicating pregnancy: Secondary | ICD-10-CM | POA: Insufficient documentation

## 2013-08-13 DIAGNOSIS — N898 Other specified noninflammatory disorders of vagina: Secondary | ICD-10-CM | POA: Insufficient documentation

## 2013-08-13 DIAGNOSIS — O26899 Other specified pregnancy related conditions, unspecified trimester: Secondary | ICD-10-CM

## 2013-08-13 LAB — WET PREP, GENITAL
Clue Cells Wet Prep HPF POC: NONE SEEN
Yeast Wet Prep HPF POC: NONE SEEN

## 2013-08-13 LAB — URINALYSIS, ROUTINE W REFLEX MICROSCOPIC
Bilirubin Urine: NEGATIVE
Glucose, UA: NEGATIVE mg/dL
Hgb urine dipstick: NEGATIVE
Ketones, ur: 15 mg/dL — AB
Leukocytes, UA: NEGATIVE
Protein, ur: NEGATIVE mg/dL
Urobilinogen, UA: 1 mg/dL (ref 0.0–1.0)
pH: 6 (ref 5.0–8.0)

## 2013-08-13 MED ORDER — ACETAMINOPHEN 325 MG PO TABS
650.0000 mg | ORAL_TABLET | Freq: Once | ORAL | Status: AC
  Administered 2013-08-13: 650 mg via ORAL
  Filled 2013-08-13: qty 2

## 2013-08-13 NOTE — MAU Note (Signed)
Pt reports no fetal movement x 1 week. Vaginal odor.

## 2013-08-13 NOTE — MAU Provider Note (Signed)
History     CSN: [id number]  Arrival date and time: 08/13/13 2100   First Provider Initiated Contact with Patient 08/13/13 2145      Chief Complaint  Patient presents with  . Decreased Fetal Movement   HPI 27 yo [id number] at 21wks 2 days presents with vaginal discharge that started 1 week ago.  Had a recent wet prep, which was negative.  Was treated with bronchitis with azithromycin about 1 week ago.  Discharge not improved after antibiotics and remains unchanged.  Admits to foul smell.  Denies dysuria, nausea, vomiting, abdominal pain.  Does have slight headache.  No provoking or palliating factors.    OB History   Grav Para Term Preterm Abortions TAB SAB Ect Mult Living   5 3 3  1  1   3       Past Medical History  Diagnosis Date  . Abnormal Pap smear   . Gestational diabetes     all pregnancies glyburide started 11/4  . Thrombocytopenia     Past Surgical History  Procedure Laterality Date  . Wisdom tooth extraction      Family History  Problem Relation Age of Onset  . Hypertension Father   . Diabetes Father   . Diabetes Mother   . Seizures Brother     History  Substance Use Topics  . Smoking status: Never Smoker   . Smokeless tobacco: Never Used  . Alcohol Use: No    Allergies:  Allergies  Allergen Reactions  . Penicillins Itching  . Latex Itching and Rash    Prescriptions prior to admission  Medication Sig Dispense Refill  . acetaminophen (TYLENOL) 500 MG tablet Take 1,000 mg by mouth every 6 (six) hours as needed for moderate pain.      Marland Kitchen albuterol (PROVENTIL HFA;VENTOLIN HFA) 108 (90 BASE) MCG/ACT inhaler Inhale 2 puffs into the lungs every 6 (six) hours as needed for wheezing or shortness of breath.  1 Inhaler  2  . azithromycin (ZITHROMAX Z-PAK) 250 MG tablet Take 2 tablets the first day then one tablet each day until pack is finished.  6 each  0  . beclomethasone (QVAR) 40 MCG/ACT inhaler Inhale 2 puffs into the lungs 2 (two) times daily.  1  Inhaler  12  . cetirizine (ZYRTEC) 10 MG tablet Take 1 tablet (10 mg total) by mouth daily.  30 tablet  3  . diphenhydrAMINE (BENADRYL) 25 MG tablet Take 25 mg by mouth 2 (two) times daily.      . hydrocortisone cream 1 % Apply to affected area 2 times daily  15 g  0  . Prenatal Vit-Fe Fumarate-FA (PRENATAL COMPLETE) 14-0.4 MG TABS Take 1 tablet by mouth once.  60 each  0    Review of Systems  Constitutional: Negative for fever and chills.  Eyes: Negative for blurred vision.  Cardiovascular: Negative for chest pain.  Gastrointestinal: Negative for nausea, vomiting, abdominal pain, diarrhea and constipation.  Genitourinary: Negative for dysuria, urgency, frequency and flank pain.  Neurological: Positive for headaches. Negative for dizziness.   Physical Exam   Blood pressure 114/79, pulse 87, temperature 97.8 F (36.6 C), temperature source Oral, resp. rate 20, height 5\' 6"  (1.676 m), weight 137.893 kg (304 lb), last menstrual period 02/20/2013, SpO2 99.00%.  Physical Exam  Constitutional: She is oriented to person, place, and time. She appears well-developed and well-nourished.  GI: Soft. She exhibits no distension and no mass. There is no tenderness. There is no rebound and no guarding.  Genitourinary: Vaginal discharge (thin, white) found.  Neurological: She is alert and oriented to person, place, and time.  Skin: Skin is warm.  Psychiatric: She has a normal mood and affect. Her behavior is normal. Judgment and thought content normal.   FHT: 166  Microscopic wet-mount exam shows negative for pathogens, normal epithelial cells, white blood cells. Urine dipstick shows negative for all components.     MAU Course  Procedures  MDM No evidence of BV or yeast infection.  Assessment and Plan  1.  Vaginal Discharge - no evidence of pathogens.  Likely physiological in nature.  GC/Chlamydia pending.  F/u with outpt clinic on Monday.  STINSON, JACOB JEHIEL 08/13/2013, 9:58 PM

## 2013-08-14 LAB — GC/CHLAMYDIA PROBE AMP
CT Probe RNA: NEGATIVE
GC Probe RNA: NEGATIVE

## 2013-08-17 ENCOUNTER — Encounter: Payer: Medicaid Other | Admitting: Family

## 2013-08-18 ENCOUNTER — Telehealth: Payer: Self-pay

## 2013-08-18 ENCOUNTER — Institutional Professional Consult (permissible substitution): Payer: Medicaid Other | Admitting: Nurse Practitioner

## 2013-08-18 NOTE — Telephone Encounter (Signed)
Called pt. Phone number listed never rang-- heard a single beep. No other number listed. Will send letter stating we are trying to schedule an appointment for 3hr test.

## 2013-08-18 NOTE — Telephone Encounter (Signed)
Message copied by Louanna Raw on Tue Aug 18, 2013 11:24 AM ------      Message from: Lesly Dukes      Created: Sat Aug 15, 2013  9:37 AM       Please call patient and see when she pans on coming to complete her GTT.  Please follow on a list to make sure she returns ------

## 2013-08-19 NOTE — Telephone Encounter (Signed)
Will also forward to front office so they can attempt to reschedule obfu and 3hr gtt

## 2013-08-19 NOTE — Telephone Encounter (Signed)
Called Monique Schmidt and left a message we are trying to reach her to schedule an appointment , please call clinic. Also called Contact number and heard message number is disconnected.  Needs to schedule ob fu and gtt.  Letter already sent.

## 2013-08-20 NOTE — L&D Delivery Note (Signed)
Delivery Note At 7:15 PM a viable female was delivered via Vaginal, Spontaneous Delivery (Presentation: Left Occiput Anterior).  APGAR: 9, 9; weight .   Placenta status: Intact, Spontaneous.  Cord: 3 vessels with the following complications: None.  Cord pH: NA  Anesthesia: None  Episiotomy: None Lacerations: 1st degree Suture Repair: hemostatic Est. Blood Loss (mL): 250  Mom to postpartum.  Baby to Couplet care / Skin to Skin.  Called to delivery. Mother pushed over 1st degree tear with only nurse in the room. Infant delivered to maternal abdomen. I arrived once infant on maternal abdomen. Cord clamped and cut by sister. Active management of 3rd stage with traction and Pitocin. Placenta delivered intact with 3v cord. Tear hemostatic and did not require a suture EBL 250. Counts correct. Hemostatic.   Tawana ScaleMichael Ryan Jkayla Spiewak, MD OB Fellow    Minta BalsamMichael R Tiondra Fang 12/15/2013, 7:50 PM

## 2013-08-25 ENCOUNTER — Telehealth: Payer: Self-pay

## 2013-08-25 ENCOUNTER — Ambulatory Visit (HOSPITAL_COMMUNITY): Payer: Medicaid Other

## 2013-08-25 NOTE — Telephone Encounter (Signed)
Message copied by Louanna RawAMPBELL, Adalyna Godbee M on Tue Aug 25, 2013  4:05 PM ------      Message from: Odelia GageLINTON, CHERYL A      Created: Tue Aug 25, 2013  1:12 PM      Regarding: 3 hr       Patient has an appointment on 08/28/2013 @ 8:45 for her 3 hr.            Thank you ------

## 2013-08-25 NOTE — Telephone Encounter (Signed)
Called pt. To inform her of appointment on 1/9 for 3hr GTT. Pt. Knew about appointment on 1/12  But was unaware of appointment on 1/9. Informed pt. She needs to come to this appointment as her results for 1hr gtt were abnormal and it is important that we have the 3hr gtt results for appointment on 1/12. Informed pt. She will have to come to appointment fasting and will be here for 3 hours. Pt. Verbalized understanding and stated she will be here.

## 2013-08-28 ENCOUNTER — Other Ambulatory Visit: Payer: Medicaid Other

## 2013-08-28 DIAGNOSIS — O219 Vomiting of pregnancy, unspecified: Secondary | ICD-10-CM

## 2013-08-28 MED ORDER — ONDANSETRON HCL 4 MG PO TABS: 4.0000 mg | ORAL_TABLET | Freq: Three times a day (TID) | ORAL | Status: DC | PRN

## 2013-08-31 ENCOUNTER — Ambulatory Visit (HOSPITAL_COMMUNITY): Payer: Medicaid Other

## 2013-08-31 ENCOUNTER — Encounter: Payer: Medicaid Other | Admitting: Obstetrics & Gynecology

## 2013-09-03 ENCOUNTER — Ambulatory Visit: Payer: Medicaid Other | Admitting: Family Medicine

## 2013-09-07 ENCOUNTER — Encounter (HOSPITAL_COMMUNITY): Payer: Self-pay | Admitting: Emergency Medicine

## 2013-09-07 ENCOUNTER — Emergency Department (HOSPITAL_COMMUNITY)
Admission: EM | Admit: 2013-09-07 | Discharge: 2013-09-07 | Disposition: A | Discharge status: Home / Self Care | Payer: Medicaid Other | Attending: Emergency Medicine | Admitting: Emergency Medicine

## 2013-09-07 ENCOUNTER — Ambulatory Visit (HOSPITAL_COMMUNITY): Payer: Medicaid Other | Attending: Obstetrics & Gynecology

## 2013-09-07 ENCOUNTER — Encounter: Payer: Medicaid Other | Admitting: Obstetrics & Gynecology

## 2013-09-07 DIAGNOSIS — IMO0002 Reserved for concepts with insufficient information to code with codable children: Secondary | ICD-10-CM | POA: Insufficient documentation

## 2013-09-07 DIAGNOSIS — O9989 Other specified diseases and conditions complicating pregnancy, childbirth and the puerperium: Secondary | ICD-10-CM | POA: Insufficient documentation

## 2013-09-07 DIAGNOSIS — R21 Rash and other nonspecific skin eruption: Secondary | ICD-10-CM | POA: Insufficient documentation

## 2013-09-07 DIAGNOSIS — Z880 Allergy status to penicillin: Secondary | ICD-10-CM | POA: Insufficient documentation

## 2013-09-07 DIAGNOSIS — N390 Urinary tract infection, site not specified: Secondary | ICD-10-CM

## 2013-09-07 DIAGNOSIS — Z3490 Encounter for supervision of normal pregnancy, unspecified, unspecified trimester: Secondary | ICD-10-CM

## 2013-09-07 DIAGNOSIS — R102 Pelvic and perineal pain: Secondary | ICD-10-CM

## 2013-09-07 DIAGNOSIS — O2390 Unspecified genitourinary tract infection in pregnancy, unspecified trimester: Secondary | ICD-10-CM | POA: Insufficient documentation

## 2013-09-07 DIAGNOSIS — Z862 Personal history of diseases of the blood and blood-forming organs and certain disorders involving the immune mechanism: Secondary | ICD-10-CM | POA: Insufficient documentation

## 2013-09-07 DIAGNOSIS — Z91040 Latex allergy status: Secondary | ICD-10-CM | POA: Insufficient documentation

## 2013-09-07 DIAGNOSIS — Z79899 Other long term (current) drug therapy: Secondary | ICD-10-CM | POA: Insufficient documentation

## 2013-09-07 DIAGNOSIS — Z8632 Personal history of gestational diabetes: Secondary | ICD-10-CM | POA: Insufficient documentation

## 2013-09-07 LAB — URINALYSIS, ROUTINE W REFLEX MICROSCOPIC
Bilirubin Urine: NEGATIVE
Glucose, UA: NEGATIVE mg/dL
KETONES UR: NEGATIVE mg/dL
NITRITE: NEGATIVE
PH: 6 (ref 5.0–8.0)
PROTEIN: 30 mg/dL — AB
Specific Gravity, Urine: 1.034 — ABNORMAL HIGH (ref 1.005–1.030)
Urobilinogen, UA: 1 mg/dL (ref 0.0–1.0)

## 2013-09-07 LAB — URINE MICROSCOPIC-ADD ON

## 2013-09-07 MED ORDER — CEPHALEXIN 500 MG PO CAPS: 500.0000 mg | ORAL_CAPSULE | Freq: Two times a day (BID) | ORAL | Status: DC

## 2013-09-07 MED ORDER — CEFTRIAXONE SODIUM 1 G IJ SOLR
1.0000 g | Freq: Once | INTRAMUSCULAR | Status: AC
  Administered 2013-09-07: 1 g via INTRAMUSCULAR
  Filled 2013-09-07: qty 10

## 2013-09-07 MED ORDER — DIPHENHYDRAMINE HCL 25 MG PO CAPS
50.0000 mg | ORAL_CAPSULE | Freq: Once | ORAL | Status: AC
  Administered 2013-09-07: 50 mg via ORAL
  Filled 2013-09-07: qty 2

## 2013-09-07 NOTE — Discharge Instructions (Signed)
Take antibiotics. Follow up very closely with OB on Tuesday.  If you were given medicines take as directed.  If you are on coumadin or contraceptives realize their levels and effectiveness is altered by many different medicines.  If you have any reaction (rash, tongues swelling, other) to the medicines stop taking and see a physician.   Please follow up as directed and return to the ER or see a physician for new or worsening symptoms.  Thank you.

## 2013-09-07 NOTE — ED Notes (Signed)
Pt states every time she eats anything for the last 4 days, she breaks out with a rash and itching to mid chest. Pt states shes also c/o 4 days ago lower abdominal pain that comes and goes. Pt states she has white vaginal discharge with itchiness. Pt states she fell last week and hurt her back. Pt in NAD at this time, ambulatory to room. Rash noted to chest area. Pt also requesting STD check. Pt states it burns when she pees. Pt also states shes had diarrhea four days straight. Pt threw up once yesterday. Denies nausea at this time.

## 2013-09-07 NOTE — ED Notes (Signed)
The pt is c/o lower abd pain with n diarrhea.  She also wants a rash checked she had ahd for a while and she wanst to bechecked for a std with back pains.  lmp July .  She reports that she is 20some weeks preg and she is high risk.  When asked  Why she [facility] here [facility] of [facility] she replied because they would sen her back here and tell her nothing was wrong plus this was the closest hosp

## 2013-09-08 LAB — URINE CULTURE
Colony Count: NO GROWTH
Culture: NO GROWTH

## 2013-09-10 NOTE — ED Provider Notes (Signed)
CSN: [id number]     Arrival date & time 09/07/13  0042 History   First MD Initiated Contact with Patient 09/07/13 0207     Chief Complaint  Patient presents with  . multiple complaints    (Consider location/radiation/quality/duration/timing/severity/associated sxs/prior Treatment) HPI Comments: 28 yo female [redacted] wks pregnant presents with multiple concerns.  General Rash for 4 days, worse with eating any type of food, pruritic.  Pt has seen OB for issues and per pt they tell her everything is okay and no treatment is given. She has dysuria, pt has had UTI in the past, feels similar, no current abx.  She has OB to fup with.  Suprapubic abdominal pain, worse with urinating.  No vaginal bleeding.  Pt has not felt contractions.  The history is provided by the patient.    Past Medical History  Diagnosis Date  . Abnormal Pap smear   . Gestational diabetes     all pregnancies glyburide started 11/4  . Thrombocytopenia    Past Surgical History  Procedure Laterality Date  . Wisdom tooth extraction     Family History  Problem Relation Age of Onset  . Hypertension Father   . Diabetes Father   . Diabetes Mother   . Seizures Brother    History  Substance Use Topics  . Smoking status: Never Smoker   . Smokeless tobacco: Never Used  . Alcohol Use: No   OB History   Grav Para Term Preterm Abortions TAB SAB Ect Mult Living   5 3 3  1  1   3      Review of Systems  Constitutional: Negative for fever and chills.  HENT: Negative for congestion.   Eyes: Negative for visual disturbance.  Respiratory: Negative for shortness of breath.   Cardiovascular: Negative for chest pain.  Gastrointestinal: Positive for abdominal pain. Negative for vomiting.  Genitourinary: Negative for dysuria, flank pain and vaginal bleeding.  Musculoskeletal: Positive for back pain. Negative for neck pain and neck stiffness.  Skin: Positive for rash.  Neurological: Negative for light-headedness and headaches.     Allergies  Penicillins and Latex  Home Medications   Current Outpatient Rx  Name  Route  Sig  Dispense  Refill  . acetaminophen (TYLENOL) 500 MG tablet   Oral   Take 1,000 mg by mouth every 6 (six) hours as needed for moderate pain.         Marland Kitchen. albuterol (PROVENTIL HFA;VENTOLIN HFA) 108 (90 BASE) MCG/ACT inhaler   Inhalation   Inhale 2 puffs into the lungs every 6 (six) hours as needed for wheezing or shortness of breath.   1 Inhaler   2   . beclomethasone (QVAR) 40 MCG/ACT inhaler   Inhalation   Inhale 2 puffs into the lungs 2 (two) times daily.   1 Inhaler   12   . Prenatal Vit-Fe Fumarate-FA (PRENATAL COMPLETE) 14-0.4 MG TABS   Oral   Take 1 tablet by mouth once.   60 each   0   . cephALEXin (KEFLEX) 500 MG capsule   Oral   Take 1 capsule (500 mg total) by mouth 2 (two) times daily.   14 capsule   0   . cetirizine (ZYRTEC) 10 MG tablet   Oral   Take 1 tablet (10 mg total) by mouth daily.   30 tablet   3    BP 122/82  Pulse 93  Temp(Src) 97.6 F (36.4 C) (Oral)  Resp 17  Wt 305 lb 9.6 oz (138.619  kg)  SpO2 100%  LMP 02/20/2013 Physical Exam  Nursing note and vitals reviewed. Constitutional: She is oriented to person, place, and time. She appears well-developed and well-nourished.  HENT:  Head: Normocephalic and atraumatic.  Eyes: Conjunctivae are normal. Right eye exhibits no discharge. Left eye exhibits no discharge.  Neck: Normal range of motion. Neck supple. No tracheal deviation present.  Cardiovascular: Normal rate and regular rhythm.   Pulmonary/Chest: Effort normal and breath sounds normal.  Abdominal: Soft. She exhibits no distension (obese). There is no tenderness. There is no guarding.  Musculoskeletal: She exhibits no edema.  Neurological: She is alert and oriented to person, place, and time.  Skin: Skin is warm. Rash (mild papular on arms, mild excoriations, no petechia) noted.  Psychiatric: She has a normal mood and affect.    ED  Course  Procedures (including critical care time) Labs Review Labs Reviewed  URINALYSIS, ROUTINE W REFLEX MICROSCOPIC - Abnormal; Notable for the following:    APPearance TURBID (*)    Specific Gravity, Urine 1.034 (*)    Hgb urine dipstick TRACE (*)    Protein, ur 30 (*)    Leukocytes, UA LARGE (*)    All other components within normal limits  URINE MICROSCOPIC-ADD ON - Abnormal; Notable for the following:    Squamous Epithelial / LPF MANY (*)    Bacteria, UA MANY (*)    All other components within normal limits  URINE CULTURE   Imaging Review No results found.  EKG Interpretation   None       MDM   1. UTI (lower urinary tract infection)   2. Pregnancy   3. Suprapubic pain    Rash - non specific, discussed po benadryl as needed. Discussed potential risk for all medicines.  Abd discomfort/ dysuria - UTI.  Rocephin in ED and po abx. No vaginal bleeding, abd exam benign.  Discussed performing vaginal exam - risks/ benefits with unknown placenta issues/ position, pt will fup with OB for pelvic exam. Stressed close fup with OB. Well appearing in ED.  Results and differential diagnosis were discussed with the patient. Close follow up outpatient was discussed, patient comfortable with the plan.   Called patient to check clinical status, spoke with family who said patient was doing okay but unable to talk at that time, gave information for call back.     Enid Skeens, MD 09/11/13 [phone]

## 2013-09-17 ENCOUNTER — Telehealth: Payer: Self-pay | Admitting: *Deleted

## 2013-09-17 DIAGNOSIS — B379 Candidiasis, unspecified: Secondary | ICD-10-CM

## 2013-09-17 DIAGNOSIS — Z3490 Encounter for supervision of normal pregnancy, unspecified, unspecified trimester: Secondary | ICD-10-CM

## 2013-09-17 MED ORDER — PRENATAL COMPLETE 14-0.4 MG PO TABS: 1.0000 | ORAL_TABLET | Freq: Once | ORAL | Status: DC

## 2013-09-17 MED ORDER — FLUCONAZOLE 150 MG PO TABS: 150.0000 mg | ORAL_TABLET | Freq: Once | ORAL | Status: DC

## 2013-09-17 NOTE — Telephone Encounter (Signed)
Pt called the front desk, requesting to speak with nurse.  Spoke with pt and pt desires prescription for prenatal vitamins and yeast infection.  Pharmacy verified.  Pt has no further request/questions.

## 2013-09-21 ENCOUNTER — Ambulatory Visit (INDEPENDENT_AMBULATORY_CARE_PROVIDER_SITE_OTHER): Payer: Medicaid Other | Admitting: Obstetrics & Gynecology

## 2013-09-21 ENCOUNTER — Other Ambulatory Visit: Payer: Self-pay | Admitting: Obstetrics & Gynecology

## 2013-09-21 VITALS — BP 118/76 | Temp 99.1°F | Wt 305.8 lb

## 2013-09-21 DIAGNOSIS — G4700 Insomnia, unspecified: Secondary | ICD-10-CM

## 2013-09-21 DIAGNOSIS — O99119 Other diseases of the blood and blood-forming organs and certain disorders involving the immune mechanism complicating pregnancy, unspecified trimester: Secondary | ICD-10-CM

## 2013-09-21 DIAGNOSIS — O24419 Gestational diabetes mellitus in pregnancy, unspecified control: Secondary | ICD-10-CM | POA: Insufficient documentation

## 2013-09-21 DIAGNOSIS — O0990 Supervision of high risk pregnancy, unspecified, unspecified trimester: Secondary | ICD-10-CM

## 2013-09-21 DIAGNOSIS — O26899 Other specified pregnancy related conditions, unspecified trimester: Secondary | ICD-10-CM

## 2013-09-21 DIAGNOSIS — O99810 Abnormal glucose complicating pregnancy: Secondary | ICD-10-CM

## 2013-09-21 DIAGNOSIS — O9989 Other specified diseases and conditions complicating pregnancy, childbirth and the puerperium: Secondary | ICD-10-CM

## 2013-09-21 DIAGNOSIS — O99713 Diseases of the skin and subcutaneous tissue complicating pregnancy, third trimester: Secondary | ICD-10-CM

## 2013-09-21 DIAGNOSIS — J4540 Moderate persistent asthma, uncomplicated: Secondary | ICD-10-CM

## 2013-09-21 DIAGNOSIS — R51 Headache: Secondary | ICD-10-CM

## 2013-09-21 DIAGNOSIS — L299 Pruritus, unspecified: Secondary | ICD-10-CM

## 2013-09-21 DIAGNOSIS — B379 Candidiasis, unspecified: Secondary | ICD-10-CM

## 2013-09-21 DIAGNOSIS — D696 Thrombocytopenia, unspecified: Secondary | ICD-10-CM

## 2013-09-21 DIAGNOSIS — E669 Obesity, unspecified: Secondary | ICD-10-CM

## 2013-09-21 DIAGNOSIS — R519 Headache, unspecified: Secondary | ICD-10-CM

## 2013-09-21 DIAGNOSIS — O99210 Obesity complicating pregnancy, unspecified trimester: Secondary | ICD-10-CM | POA: Insufficient documentation

## 2013-09-21 DIAGNOSIS — D689 Coagulation defect, unspecified: Secondary | ICD-10-CM

## 2013-09-21 DIAGNOSIS — J45909 Unspecified asthma, uncomplicated: Secondary | ICD-10-CM

## 2013-09-21 HISTORY — DX: Obesity complicating pregnancy, unspecified trimester: O99.210

## 2013-09-21 LAB — COMPREHENSIVE METABOLIC PANEL
ALK PHOS: 73 U/L (ref 39–117)
ALT: 8 U/L (ref 0–35)
AST: 12 U/L (ref 0–37)
Albumin: 3.2 g/dL — ABNORMAL LOW (ref 3.5–5.2)
BUN: 6 mg/dL (ref 6–23)
CALCIUM: 8.6 mg/dL (ref 8.4–10.5)
CHLORIDE: 101 meq/L (ref 96–112)
CO2: 22 mEq/L (ref 19–32)
Creat: 0.45 mg/dL — ABNORMAL LOW (ref 0.50–1.10)
Glucose, Bld: 125 mg/dL — ABNORMAL HIGH (ref 70–99)
POTASSIUM: 3.5 meq/L (ref 3.5–5.3)
Sodium: 133 mEq/L — ABNORMAL LOW (ref 135–145)
TOTAL PROTEIN: 6 g/dL (ref 6.0–8.3)
Total Bilirubin: 0.2 mg/dL (ref 0.2–1.2)

## 2013-09-21 LAB — HEMOGLOBIN A1C
Hgb A1c MFr Bld: 6.6 % — ABNORMAL HIGH (ref <5.7)
Mean Plasma Glucose: 143 mg/dL — ABNORMAL HIGH (ref <117)

## 2013-09-21 LAB — HIV ANTIBODY (ROUTINE TESTING W REFLEX): HIV: NONREACTIVE

## 2013-09-21 LAB — RPR

## 2013-09-21 MED ORDER — ZOLPIDEM TARTRATE 10 MG PO TABS: 5.0000 mg | ORAL_TABLET | Freq: Every evening | ORAL | Status: DC | PRN

## 2013-09-21 MED ORDER — HYDROXYZINE HCL 25 MG PO TABS: 25.0000 mg | ORAL_TABLET | Freq: Four times a day (QID) | ORAL | Status: DC | PRN

## 2013-09-21 MED ORDER — FLUCONAZOLE 150 MG PO TABS: 150.0000 mg | ORAL_TABLET | Freq: Once | ORAL | Status: DC

## 2013-09-21 NOTE — Patient Instructions (Signed)
Return to clinic for any obstetric concerns or go to MAU for evaluation  

## 2013-09-21 NOTE — Progress Notes (Signed)
P= 100 C/o of still having a little white discharge/itching. Took Diflucan on 1/29.  Discussed the need to have a 3hr gtt; pt. Did not come to lab appointment on 1/9. Pt. Agrees to schedule appt. For when it would work best for her before she leaves today.  States "my body doesn't feel right today, I feel weak." C/o of intermittent dizziness and states she has felt "off" for the last two days.  Requests sleep aid prescription.   Ambien prescription printed and pt. Left before she could get it. Prescription called in to [facility] on [address]

## 2013-09-21 NOTE — Progress Notes (Signed)
Needs 3 hr GTT, had 1 hr GTT of 169 at 18 weeks.  Keeps missing appointments.  If no 3 hr GTT by 30 weeks, consider treating as outright GDM.  Patient did not give urine sample. Diffuse pruritus reported, no rashes, cholestasis labs checked in addition to third trimester labs Diflucan refill ordered for possible yeast infection.   Ambien prescribed as needed for insomnia. Next growth scan is 09/22/13, will follow up results and manage accordingly. No other complaints or concerns.  Fetal movement and labor precautions reviewed.

## 2013-09-22 ENCOUNTER — Ambulatory Visit (HOSPITAL_COMMUNITY): Payer: Medicaid Other

## 2013-09-22 LAB — CBC
HCT: 33.4 % — ABNORMAL LOW (ref 36.0–46.0)
Hemoglobin: 11.5 g/dL — ABNORMAL LOW (ref 12.0–15.0)
MCH: 26.6 pg (ref 26.0–34.0)
MCHC: 34.4 g/dL (ref 30.0–36.0)
MCV: 77.3 fL — AB (ref 78.0–100.0)
PLATELETS: 126 10*3/uL — AB (ref 150–400)
RBC: 4.32 MIL/uL (ref 3.87–5.11)
RDW: 15.6 % — AB (ref 11.5–15.5)
WBC: 9.3 10*3/uL (ref 4.0–10.5)

## 2013-09-23 LAB — BILE ACIDS, TOTAL: Bile Acids Total: 7 umol/L (ref 0–19)

## 2013-09-25 ENCOUNTER — Other Ambulatory Visit: Payer: Medicaid Other

## 2013-09-25 ENCOUNTER — Ambulatory Visit (HOSPITAL_COMMUNITY)
Admission: RE | Admit: 2013-09-25 | Discharge: 2013-09-25 | Disposition: A | Discharge status: Home / Self Care | Payer: Medicaid Other | Source: Ambulatory Visit | Attending: Obstetrics & Gynecology | Admitting: Obstetrics & Gynecology

## 2013-09-25 DIAGNOSIS — E669 Obesity, unspecified: Secondary | ICD-10-CM | POA: Insufficient documentation

## 2013-09-25 DIAGNOSIS — O99210 Obesity complicating pregnancy, unspecified trimester: Principal | ICD-10-CM

## 2013-09-25 DIAGNOSIS — Z3689 Encounter for other specified antenatal screening: Secondary | ICD-10-CM | POA: Insufficient documentation

## 2013-09-25 DIAGNOSIS — O0992 Supervision of high risk pregnancy, unspecified, second trimester: Secondary | ICD-10-CM

## 2013-09-25 DIAGNOSIS — O09299 Supervision of pregnancy with other poor reproductive or obstetric history, unspecified trimester: Secondary | ICD-10-CM | POA: Insufficient documentation

## 2013-09-25 DIAGNOSIS — O99810 Abnormal glucose complicating pregnancy: Secondary | ICD-10-CM

## 2013-09-26 ENCOUNTER — Encounter: Payer: Self-pay | Admitting: Obstetrics & Gynecology

## 2013-09-26 LAB — GLUCOSE TOLERANCE, 3 HOURS
GLUCOSE 3 HOUR GTT: 143 mg/dL (ref 70–144)
GLUCOSE, 1 HOUR-GESTATIONAL: 237 mg/dL — AB (ref 70–189)
GLUCOSE, 2 HOUR-GESTATIONAL: 219 mg/dL — AB (ref 70–164)
Glucose Tolerance, Fasting: 139 mg/dL — ABNORMAL HIGH (ref 70–104)

## 2013-09-27 LAB — T4, FREE: FREE T4: 1.1 ng/dL (ref 0.80–1.80)

## 2013-09-27 LAB — TSH: TSH: 0.292 u[IU]/mL — ABNORMAL LOW (ref 0.350–4.500)

## 2013-09-27 LAB — T3, FREE: T3, Free: 3 pg/mL (ref 2.3–4.2)

## 2013-09-28 ENCOUNTER — Telehealth: Payer: Self-pay | Admitting: *Deleted

## 2013-09-28 ENCOUNTER — Encounter: Payer: Self-pay | Admitting: Obstetrics & Gynecology

## 2013-09-28 NOTE — Telephone Encounter (Signed)
Spoke with patient concerning GDM diagnosis, will schedule appt to meet with diabetic educator next Monday 10/05/13 @ 1000.  Pt verbalizes understanding.

## 2013-09-28 NOTE — Telephone Encounter (Signed)
Message copied by Dorothyann PengHAIZLIP, Markeshia Giebel E on Mon Sep 28, 2013  4:09 PM ------      Message from: Tereso NewcomerANYANWU, UGONNA A      Created: Sat Sep 26, 2013  6:38 PM       Patient has GDM, needs appointment on Monday to get DM education and supplies.Please call to inform patient of results and recommendations.       ------

## 2013-09-29 ENCOUNTER — Encounter: Payer: Self-pay | Admitting: Obstetrics & Gynecology

## 2013-10-02 ENCOUNTER — Encounter: Payer: Self-pay | Admitting: *Deleted

## 2013-10-05 ENCOUNTER — Ambulatory Visit: Payer: Medicaid Other

## 2013-10-07 ENCOUNTER — Encounter: Payer: Self-pay | Admitting: *Deleted

## 2013-10-12 ENCOUNTER — Encounter: Payer: Medicaid Other | Attending: Family Medicine | Admitting: *Deleted

## 2013-10-12 ENCOUNTER — Ambulatory Visit (INDEPENDENT_AMBULATORY_CARE_PROVIDER_SITE_OTHER): Payer: Medicaid Other | Admitting: Family Medicine

## 2013-10-12 ENCOUNTER — Encounter: Payer: Self-pay | Admitting: Family Medicine

## 2013-10-12 VITALS — BP 111/65 | Temp 97.6°F | Wt 309.8 lb

## 2013-10-12 DIAGNOSIS — E669 Obesity, unspecified: Secondary | ICD-10-CM

## 2013-10-12 DIAGNOSIS — O99713 Diseases of the skin and subcutaneous tissue complicating pregnancy, third trimester: Secondary | ICD-10-CM

## 2013-10-12 DIAGNOSIS — Z713 Dietary counseling and surveillance: Secondary | ICD-10-CM | POA: Insufficient documentation

## 2013-10-12 DIAGNOSIS — O9989 Other specified diseases and conditions complicating pregnancy, childbirth and the puerperium: Secondary | ICD-10-CM

## 2013-10-12 DIAGNOSIS — O99119 Other diseases of the blood and blood-forming organs and certain disorders involving the immune mechanism complicating pregnancy, unspecified trimester: Secondary | ICD-10-CM

## 2013-10-12 DIAGNOSIS — D689 Coagulation defect, unspecified: Secondary | ICD-10-CM

## 2013-10-12 DIAGNOSIS — O0990 Supervision of high risk pregnancy, unspecified, unspecified trimester: Secondary | ICD-10-CM

## 2013-10-12 DIAGNOSIS — O99810 Abnormal glucose complicating pregnancy: Secondary | ICD-10-CM | POA: Insufficient documentation

## 2013-10-12 DIAGNOSIS — L299 Pruritus, unspecified: Secondary | ICD-10-CM

## 2013-10-12 DIAGNOSIS — O24419 Gestational diabetes mellitus in pregnancy, unspecified control: Secondary | ICD-10-CM

## 2013-10-12 DIAGNOSIS — D696 Thrombocytopenia, unspecified: Secondary | ICD-10-CM

## 2013-10-12 DIAGNOSIS — O99210 Obesity complicating pregnancy, unspecified trimester: Secondary | ICD-10-CM

## 2013-10-12 LAB — POCT URINALYSIS DIP (DEVICE)
Bilirubin Urine: NEGATIVE
Glucose, UA: 500 mg/dL — AB
Ketones, ur: NEGATIVE mg/dL
Leukocytes, UA: NEGATIVE
Nitrite: NEGATIVE
PH: 6 (ref 5.0–8.0)
PROTEIN: NEGATIVE mg/dL
Specific Gravity, Urine: >=1.03 (ref 1.005–1.030)
Urobilinogen, UA: 0.2 mg/dL (ref 0.0–1.0)

## 2013-10-12 LAB — PROTEIN / CREATININE RATIO, URINE
Creatinine, Urine: 169.9 mg/dL
Protein Creatinine Ratio: 0.09 (ref <0.15)
Total Protein, Urine: 15 mg/dL

## 2013-10-12 MED ORDER — ACCU-CHEK FASTCLIX LANCETS MISC: 1.0000 | Freq: Four times a day (QID) | Status: DC

## 2013-10-12 MED ORDER — GLUCOSE BLOOD VI STRP: ORAL_STRIP | Status: DC

## 2013-10-12 MED ORDER — CYCLOBENZAPRINE HCL 10 MG PO TABS: 10.0000 mg | ORAL_TABLET | Freq: Every evening | ORAL | Status: DC | PRN

## 2013-10-12 NOTE — Progress Notes (Signed)
  Patient was seen for Gestational Diabetes self-management. She has a history of same, required medication    States the definition of Gestational Diabetes  States when to check blood glucose levels  Demonstrates proper blood glucose monitoring techniques  States the effect of stress and exercise on blood glucose levels  States the importance of limiting caffeine and abstaining from alcohol and smoking  Plan:  Consider  increasing your activity level by walking daily as tolerated Begin checking BG before breakfast and 1-2 hours after first bit of breakfast, lunch and dinner after  as directed by MD  Take medication  as directed by MD  Blood glucose monitor given: Accu [employer]k Nano BG Monitoring Kit Lot # [id number] 11/18/14 Exp: 11/18/14 Blood glucose reading: 143  Patient instructed to monitor glucose levels: FBS: 60 - <90 2 hour: <120  Patient received the following handouts:  Nutrition Diabetes and Pregnancy  Patient will be seen for follow-up next week.

## 2013-10-12 NOTE — Patient Instructions (Signed)
Third Trimester of Pregnancy  The third trimester is from week 29 through week 42, months 7 through 9. The third trimester is a time when the fetus is growing rapidly. At the end of the ninth month, the fetus is about 20 inches in length and weighs 6 10 pounds.   BODY CHANGES  Your body goes through many changes during pregnancy. The changes vary from woman to woman.    Your weight will continue to increase. You can expect to gain 25 35 pounds (11 16 kg) by the end of the pregnancy.   You may begin to get stretch marks on your hips, abdomen, and breasts.   You may urinate more often because the fetus is moving lower into your pelvis and pressing on your bladder.   You may develop or continue to have heartburn as a result of your pregnancy.   You may develop constipation because certain hormones are causing the muscles that push waste through your intestines to slow down.   You may develop hemorrhoids or swollen, bulging veins (varicose veins).   You may have pelvic pain because of the weight gain and pregnancy hormones relaxing your joints between the bones in your pelvis. Back aches may result from over exertion of the muscles supporting your posture.   Your breasts will continue to grow and be tender. A yellow discharge may leak from your breasts called colostrum.   Your belly button may stick out.   You may feel short of breath because of your expanding uterus.   You may notice the fetus "dropping," or moving lower in your abdomen.   You may have a bloody mucus discharge. This usually occurs a few days to a week before labor begins.   Your cervix becomes thin and soft (effaced) near your due date.  WHAT TO EXPECT AT YOUR PRENATAL EXAMS   You will have prenatal exams every 2 weeks until week 36. Then, you will have weekly prenatal exams. During a routine prenatal visit:   You will be weighed to make sure you and the fetus are growing normally.   Your blood pressure is taken.   Your abdomen will be  measured to track your baby's growth.   The fetal heartbeat will be listened to.   Any test results from the previous visit will be discussed.   You may have a cervical check near your due date to see if you have effaced.  At around 36 weeks, your caregiver will check your cervix. At the same time, your caregiver will also perform a test on the secretions of the vaginal tissue. This test is to determine if a type of bacteria, Group B streptococcus, is present. Your caregiver will explain this further.  Your caregiver may ask you:   What your birth plan is.   How you are feeling.   If you are feeling the baby move.   If you have had any abnormal symptoms, such as leaking fluid, bleeding, severe headaches, or abdominal cramping.   If you have any questions.  Other tests or screenings that may be performed during your third trimester include:   Blood tests that check for low iron levels (anemia).   Fetal testing to check the health, activity level, and growth of the fetus. Testing is done if you have certain medical conditions or if there are problems during the pregnancy.  FALSE LABOR  You may feel small, irregular contractions that eventually go away. These are called Braxton Hicks contractions, or   false labor. Contractions may last for hours, days, or even weeks before true labor sets in. If contractions come at regular intervals, intensify, or become painful, it is best to be seen by your caregiver.   SIGNS OF LABOR    Menstrual-like cramps.   Contractions that are 5 minutes apart or less.   Contractions that start on the top of the uterus and spread down to the lower abdomen and back.   A sense of increased pelvic pressure or back pain.   A watery or bloody mucus discharge that comes from the vagina.  If you have any of these signs before the 37th week of pregnancy, call your caregiver right away. You need to go to the hospital to get checked immediately.  HOME CARE INSTRUCTIONS    Avoid all  smoking, herbs, alcohol, and unprescribed drugs. These chemicals affect the formation and growth of the baby.   Follow your caregiver's instructions regarding medicine use. There are medicines that are either safe or unsafe to take during pregnancy.   Exercise only as directed by your caregiver. Experiencing uterine cramps is a good sign to stop exercising.   Continue to eat regular, healthy meals.   Wear a good support bra for breast tenderness.   Do not use hot tubs, steam rooms, or saunas.   Wear your seat belt at all times when driving.   Avoid raw meat, uncooked cheese, cat litter boxes, and soil used by cats. These carry germs that can cause birth defects in the baby.   Take your prenatal vitamins.   Try taking a stool softener (if your caregiver approves) if you develop constipation. Eat more high-fiber foods, such as fresh vegetables or fruit and whole grains. Drink plenty of fluids to keep your urine clear or pale yellow.   Take warm sitz baths to soothe any pain or discomfort caused by hemorrhoids. Use hemorrhoid cream if your caregiver approves.   If you develop varicose veins, wear support hose. Elevate your feet for 15 minutes, 3 4 times a day. Limit salt in your diet.   Avoid heavy lifting, wear low heal shoes, and practice good posture.   Rest a lot with your legs elevated if you have leg cramps or low back pain.   Visit your dentist if you have not gone during your pregnancy. Use a soft toothbrush to brush your teeth and be gentle when you floss.   A sexual relationship may be continued unless your caregiver directs you otherwise.   Do not travel far distances unless it is absolutely necessary and only with the approval of your caregiver.   Take prenatal classes to understand, practice, and ask questions about the labor and delivery.   Make a trial run to the hospital.   Pack your hospital bag.   Prepare the baby's nursery.   Continue to go to all your prenatal visits as directed  by your caregiver.  SEEK MEDICAL CARE IF:   You are unsure if you are in labor or if your water has broken.   You have dizziness.   You have mild pelvic cramps, pelvic pressure, or nagging pain in your abdominal area.   You have persistent nausea, vomiting, or diarrhea.   You have a bad smelling vaginal discharge.   You have pain with urination.  SEEK IMMEDIATE MEDICAL CARE IF:    You have a fever.   You are leaking fluid from your vagina.   You have spotting or bleeding from your vagina.     You have severe abdominal cramping or pain.   You have rapid weight loss or gain.   You have shortness of breath with chest pain.   You notice sudden or extreme swelling of your face, hands, ankles, feet, or legs.   You have not felt your baby move in over an hour.   You have severe headaches that do not go away with medicine.   You have vision changes.  Document Released: 07/31/2001 Document Revised: 04/08/2013 Document Reviewed: 10/07/2012  ExitCare Patient Information 2014 [employer].

## 2013-10-12 NOTE — Progress Notes (Signed)
P=107  had ctx yesterday but resolved with rest/hydration

## 2013-10-12 NOTE — Progress Notes (Signed)
Nutrition note: GDM diet education Pt is a newly diagnosed GDM pt but had it in a previous pregnancy. Pt has gained 11.8# @ 7945w6d, which is wnl. Pt reports eating 3 meals & 3 snacks/d. Pt reports drinking mostly water but does drink 20 oz Dr Reino KentPepper most days. Pt is taking PNV. Reviewed GDM diet with pt & encouraged pt to decrease/ stop drinking Dr Reino KentPepper. Pt agrees to follow GDM diet with 3 meals & 3 snacks/d with proper CHO/ protein combination. F/u in 2-4 wks Blondell RevealLaura Sumire Halbleib, MS, RD, LDN, [facility]

## 2013-10-12 NOTE — Progress Notes (Signed)
S: 28 yo [id number] @ 367w6d here for ROBV - new diagnosis of gestational DM - doesn't have meter or strips - irregular contractions that resolved with rest yesterday. None since.  - having back pain - no itching - no lof, vb.  +FM  O: see flowsheet  A/P DM - diabetes education and nutrition today. Will need meter and strips - have her f/u in 1 week for review and likely medication start - prot/cr ratio today  Back pain - msk pain- rx of flexeril x10 but let her know she cannot take with ambien at the same time  Asthma - controlled.   F/u in 1 week

## 2013-10-14 ENCOUNTER — Telehealth: Payer: Self-pay | Admitting: General Practice

## 2013-10-14 DIAGNOSIS — O24419 Gestational diabetes mellitus in pregnancy, unspecified control: Secondary | ICD-10-CM

## 2013-10-14 MED ORDER — GLUCOSE BLOOD VI STRP: ORAL_STRIP | Status: DC

## 2013-10-14 NOTE — Telephone Encounter (Signed)
Pt called and stated she wanted to know what was going on with her strips and that she needs another Rx for flagyl.

## 2013-10-14 NOTE — Telephone Encounter (Signed)
Called pt and informed her that her Rx for glucose test strips has been sent to her pharmacy.  She also requested Rx for Flagyl because she thinks she has another bacterial infection. I explained that she will need evaluation prior to receiving that Rx. I advised her to be sure to mention her sx when she comes in for her next visit on 10/19/13 so that appropriate testing can be done. Pt voiced understanding.

## 2013-10-19 ENCOUNTER — Telehealth: Payer: Self-pay | Admitting: Obstetrics and Gynecology

## 2013-10-19 ENCOUNTER — Encounter: Payer: Medicaid Other | Admitting: Family Medicine

## 2013-10-19 NOTE — Telephone Encounter (Signed)
Patient states she overslept

## 2013-10-26 ENCOUNTER — Encounter: Payer: Self-pay | Admitting: Obstetrics and Gynecology

## 2013-10-26 ENCOUNTER — Encounter: Payer: Medicaid Other | Admitting: Obstetrics and Gynecology

## 2013-10-27 ENCOUNTER — Encounter: Payer: Self-pay | Admitting: *Deleted

## 2013-10-29 ENCOUNTER — Inpatient Hospital Stay (HOSPITAL_COMMUNITY)
Admission: AD | Admit: 2013-10-29 | Discharge: 2013-10-29 | Disposition: A | Discharge status: Home / Self Care | Payer: Medicaid Other | Source: Ambulatory Visit | Attending: Obstetrics & Gynecology | Admitting: Obstetrics & Gynecology

## 2013-10-29 ENCOUNTER — Encounter (HOSPITAL_COMMUNITY): Payer: Self-pay | Admitting: *Deleted

## 2013-10-29 DIAGNOSIS — L299 Pruritus, unspecified: Secondary | ICD-10-CM

## 2013-10-29 DIAGNOSIS — M549 Dorsalgia, unspecified: Secondary | ICD-10-CM | POA: Insufficient documentation

## 2013-10-29 DIAGNOSIS — O99713 Diseases of the skin and subcutaneous tissue complicating pregnancy, third trimester: Secondary | ICD-10-CM

## 2013-10-29 DIAGNOSIS — A499 Bacterial infection, unspecified: Secondary | ICD-10-CM | POA: Insufficient documentation

## 2013-10-29 DIAGNOSIS — E669 Obesity, unspecified: Secondary | ICD-10-CM | POA: Insufficient documentation

## 2013-10-29 DIAGNOSIS — O4700 False labor before 37 completed weeks of gestation, unspecified trimester: Secondary | ICD-10-CM | POA: Insufficient documentation

## 2013-10-29 DIAGNOSIS — B9689 Other specified bacterial agents as the cause of diseases classified elsewhere: Secondary | ICD-10-CM | POA: Insufficient documentation

## 2013-10-29 DIAGNOSIS — O99210 Obesity complicating pregnancy, unspecified trimester: Secondary | ICD-10-CM

## 2013-10-29 DIAGNOSIS — O2390 Unspecified genitourinary tract infection in pregnancy, unspecified trimester: Secondary | ICD-10-CM | POA: Insufficient documentation

## 2013-10-29 DIAGNOSIS — R109 Unspecified abdominal pain: Secondary | ICD-10-CM | POA: Insufficient documentation

## 2013-10-29 DIAGNOSIS — N760 Acute vaginitis: Secondary | ICD-10-CM | POA: Insufficient documentation

## 2013-10-29 DIAGNOSIS — O99810 Abnormal glucose complicating pregnancy: Secondary | ICD-10-CM | POA: Insufficient documentation

## 2013-10-29 DIAGNOSIS — O24419 Gestational diabetes mellitus in pregnancy, unspecified control: Secondary | ICD-10-CM

## 2013-10-29 HISTORY — DX: Headache: R51

## 2013-10-29 HISTORY — DX: Umbilical hernia without obstruction or gangrene: K42.9

## 2013-10-29 LAB — WET PREP, GENITAL
Trich, Wet Prep: NONE SEEN
YEAST WET PREP: NONE SEEN

## 2013-10-29 LAB — URINALYSIS, ROUTINE W REFLEX MICROSCOPIC
Bilirubin Urine: NEGATIVE
GLUCOSE, UA: NEGATIVE mg/dL
Hgb urine dipstick: NEGATIVE
Ketones, ur: NEGATIVE mg/dL
LEUKOCYTES UA: NEGATIVE
Nitrite: NEGATIVE
PH: 6 (ref 5.0–8.0)
Protein, ur: NEGATIVE mg/dL
Specific Gravity, Urine: >1.03 — ABNORMAL HIGH (ref 1.005–1.030)
Urobilinogen, UA: 0.2 mg/dL (ref 0.0–1.0)

## 2013-10-29 LAB — FETAL FIBRONECTIN: Fetal Fibronectin: POSITIVE — AB

## 2013-10-29 LAB — OB RESULTS CONSOLE GC/CHLAMYDIA
CHLAMYDIA, DNA PROBE: NEGATIVE
Gonorrhea: NEGATIVE

## 2013-10-29 MED ORDER — BUTALBITAL-APAP-CAFFEINE 50-325-40 MG PO TABS: 1.0000 | ORAL_TABLET | Freq: Four times a day (QID) | ORAL | Status: DC | PRN

## 2013-10-29 MED ORDER — METRONIDAZOLE 500 MG PO TABS: 500.0000 mg | ORAL_TABLET | Freq: Two times a day (BID) | ORAL | Status: DC

## 2013-10-29 MED ORDER — BUTALBITAL-APAP-CAFFEINE 50-325-40 MG PO TABS
2.0000 | ORAL_TABLET | Freq: Once | ORAL | Status: AC
  Administered 2013-10-29: 2 via ORAL
  Filled 2013-10-29: qty 2

## 2013-10-29 NOTE — MAU Note (Signed)
Patient states she has back pain for 3-4 days. Started having lower abdominal pain today that feels "like baby dropping". Feels like she cannot urinate at times. Feels dizzy, with some nausea, no vomiting. Had spotting a few days ago, none now but has a mucus discharge. Hurts with walking.

## 2013-10-29 NOTE — MAU Provider Note (Signed)
History   [name] is a 28 yo G5P3013 at 6644w2d reporting to the MAU for back pain for 3-4 days. The pain comes and goes. She also started having lower abdominal pain today that feels "like baby dropping". Feels like she has to urinate but none comes out. The patient reports that she feels dizzy and has experienced some nausea today but no vomiting. She had light spotting a few days ago which has subsided, but now reports a mucus-like discharge. Patient states that it "hurts when she walks".  [facility] The pregnancy is complicated by gestational diabetes (A2-glyburide), obesity and headaches. +FM, Contractions, and Headaches.  Denies VB, LOF.    CSN: [id number]  Arrival date and time: 10/29/13 1846   None     Chief Complaint  Patient presents with  . Back Pain  . Abdominal Pain   HPI  OB History   Grav Para Term Preterm Abortions TAB SAB Ect Mult Living   5 3 3  1  1   3       Past Medical History  Diagnosis Date  . Abnormal Pap smear   . Gestational diabetes     all pregnancies glyburide started 11/4  . Thrombocytopenia   . Headache(784.0)   . Vaginal Pap smear, abnormal   . Hernia, umbilical     Past Surgical History  Procedure Laterality Date  . Wisdom tooth extraction      Family History  Problem Relation Age of Onset  . Hypertension Father   . Diabetes Father   . Diabetes Mother   . Seizures Brother     History  Substance Use Topics  . Smoking status: Never Smoker   . Smokeless tobacco: Never Used  . Alcohol Use: No    Allergies:  Allergies  Allergen Reactions  . Penicillins Itching  . Latex Itching and Rash    Prescriptions prior to admission  Medication Sig Dispense Refill  . albuterol (PROVENTIL HFA;VENTOLIN HFA) 108 (90 BASE) MCG/ACT inhaler Inhale 2 puffs into the lungs every 6 (six) hours as needed for wheezing or shortness of breath.  1 Inhaler  2  . beclomethasone (QVAR) 40 MCG/ACT inhaler Inhale 2 puffs into the  lungs 2 (two) times daily.  1 Inhaler  12  . cyclobenzaprine (FLEXERIL) 10 MG tablet Take 1 tablet (10 mg total) by mouth at bedtime as needed for muscle spasms.  10 tablet  0  . hydrOXYzine (ATARAX/VISTARIL) 25 MG tablet Take 1-2 tablets (25-50 mg total) by mouth every 6 (six) hours as needed for anxiety or itching.  60 tablet  2  . Prenatal Vit-Fe Fumarate-FA (PRENATAL MULTIVITAMIN) TABS tablet Take 1 tablet by mouth daily at 12 noon.      Marland Kitchen. zolpidem (AMBIEN) 10 MG tablet Take 5-10 mg by mouth at bedtime as needed for sleep.      Marland Kitchen. ACCU-CHEK FASTCLIX LANCETS MISC 1 each by Percutaneous route 4 (four) times daily.  102 each  12  . glucose blood test strip Check blood sugar 4 times daily as instructed  50 each  12  . zolpidem (AMBIEN) 10 MG tablet Take 0.5-1 tablets (5-10 mg total) by mouth at bedtime as needed for sleep.  30 tablet  3    Review of Systems  Constitutional: Negative.   Eyes: Negative.   Respiratory: Negative.   Cardiovascular: Negative.   Gastrointestinal: Positive for nausea and abdominal pain.  Genitourinary: Positive for dysuria.  Musculoskeletal: Positive for back pain.  Skin: Negative.   Neurological: Positive for dizziness and headaches.  Endo/Heme/Allergies: Negative.   Psychiatric/Behavioral: Negative.    Physical Exam   Blood pressure 102/58, pulse 102, temperature 98.4 F (36.9 C), temperature source Oral, resp. rate 18, height 5' 1.5" (1.562 m), weight 144.244 kg (318 lb), last menstrual period 02/20/2013, SpO2 100.00%.  Physical Exam  Constitutional: She is oriented to person, place, and time. She appears well-developed and well-nourished.  HENT:  Head: Normocephalic and atraumatic.  Eyes: Conjunctivae are normal.  Neck: Normal range of motion. Neck supple.  Cardiovascular: Normal rate, regular rhythm and normal heart sounds.   Respiratory: Effort normal and breath sounds normal.  Genitourinary:  Gravid uterus Leukorrhea SVE closed/thick/high   Musculoskeletal: Normal range of motion.  Neurological: She is alert and oriented to person, place, and time.  Skin: Skin is warm and dry.  Psychiatric: She has a normal mood and affect. Her behavior is normal. Thought content normal.   EFM Baseline 145 Moderate variability Accelerations Present No decels Category 1 MAU Course  Procedures  MDM Results for orders placed during the hospital encounter of 10/29/13 (from the past 24 hour(s))  URINALYSIS, ROUTINE W REFLEX MICROSCOPIC     Status: Abnormal   Collection Time    10/29/13  7:05 PM      Result Value Ref Range   Color, Urine YELLOW  YELLOW   APPearance CLEAR  CLEAR   Specific Gravity, Urine >1.030 (*) 1.005 - 1.030   pH 6.0  5.0 - 8.0   Glucose, UA NEGATIVE  NEGATIVE mg/dL   Hgb urine dipstick NEGATIVE  NEGATIVE   Bilirubin Urine NEGATIVE  NEGATIVE   Ketones, ur NEGATIVE  NEGATIVE mg/dL   Protein, ur NEGATIVE  NEGATIVE mg/dL   Urobilinogen, UA 0.2  0.0 - 1.0 mg/dL   Nitrite NEGATIVE  NEGATIVE   Leukocytes, UA NEGATIVE  NEGATIVE  WET PREP, GENITAL     Status: Abnormal   Collection Time    10/29/13  8:50 PM      Result Value Ref Range   Yeast Wet Prep HPF POC NONE SEEN  NONE SEEN   Trich, Wet Prep NONE SEEN  NONE SEEN   Clue Cells Wet Prep HPF POC MODERATE (*) NONE SEEN   WBC, Wet Prep HPF POC MODERATE (*) NONE SEEN  FETAL FIBRONECTIN     Status: Abnormal   Collection Time    10/29/13  8:50 PM      Result Value Ref Range   Fetal Fibronectin POSITIVE (*) NEGATIVE     Assessment and Plan  28 yo [id number] with IUP @ [redacted]w[redacted]d Obesity GDMA2 +FFN: discussed  With Dr. Macon Large; no need for tx with a closed cx and rare contractions +BV  Discharge to home with PTL precautions Rx for Metronidazole Encouraged to increase PO fluid intake Keep next scheduled prenatal visit at [facility]   Monique Schmidt 10/29/2013, 8:59 PM   I have seen and examined this patient and agree the above  assessment. Monique Schmidt,Monique Schmidt 10/29/2013 10:40 PM

## 2013-10-29 NOTE — Discharge Instructions (Signed)
Abdominal Pain During Pregnancy °Abdominal pain is common in pregnancy. Most of the time, it does not cause harm. There are many causes of abdominal pain. Some causes are more serious than others. Some of the causes of abdominal pain in pregnancy are easily diagnosed. Occasionally, the diagnosis takes time to understand. Other times, the cause is not determined. Abdominal pain can be a sign that something is very wrong with the pregnancy, or the pain may have nothing to do with the pregnancy at all. For this reason, always tell your health care provider if you have any abdominal discomfort. °HOME CARE INSTRUCTIONS  °Monitor your abdominal pain for any changes. The following actions may help to alleviate any discomfort you are experiencing: °· Do not have sexual intercourse or put anything in your vagina until your symptoms go away completely. °· Get plenty of rest until your pain improves. °· Drink clear fluids if you feel nauseous. Avoid solid food as long as you are uncomfortable or nauseous. °· Only take over-the-counter or prescription medicine as directed by your health care provider. °· Keep all follow-up appointments with your health care provider. °SEEK IMMEDIATE MEDICAL CARE IF: °· You are bleeding, leaking fluid, or passing tissue from the vagina. °· You have increasing pain or cramping. °· You have persistent vomiting. °· You have painful or bloody urination. °· You have a fever. °· You notice a decrease in your baby's movements. °· You have extreme weakness or feel faint. °· You have shortness of breath, with or without abdominal pain. °· You develop a severe headache with abdominal pain. °· You have abnormal vaginal discharge with abdominal pain. °· You have persistent diarrhea. °· You have abdominal pain that continues even after rest, or gets worse. °MAKE SURE YOU:  °· Understand these instructions. °· Will watch your condition. °· Will get help right away if you are not doing well or get  worse. °Document Released: 08/06/2005 Document Revised: 05/27/2013 Document Reviewed: 03/05/2013 °Exit[employer]® Patient Information ©2014 [employer]. ° °

## 2013-10-30 ENCOUNTER — Other Ambulatory Visit: Payer: Self-pay | Admitting: Obstetrics & Gynecology

## 2013-10-30 LAB — GC/CHLAMYDIA PROBE AMP
CT Probe RNA: NEGATIVE
GC Probe RNA: NEGATIVE

## 2013-10-30 NOTE — MAU Provider Note (Signed)
Attestation of Attending Supervision of Advanced Practitioner (PA/CNM/NP): Evaluation and management procedures were performed by the Advanced Practitioner under my supervision and collaboration.  I have reviewed the Advanced Practitioner's note and chart, and I agree with the management and plan.  Idonia Zollinger, MD, FACOG Attending Obstetrician & Gynecologist Faculty Practice, [facility]  

## 2013-11-09 ENCOUNTER — Encounter: Payer: Self-pay | Admitting: Family Medicine

## 2013-11-09 ENCOUNTER — Encounter: Payer: Medicaid Other | Attending: Obstetrics and Gynecology | Admitting: *Deleted

## 2013-11-09 ENCOUNTER — Telehealth: Payer: Self-pay | Admitting: *Deleted

## 2013-11-09 ENCOUNTER — Ambulatory Visit (INDEPENDENT_AMBULATORY_CARE_PROVIDER_SITE_OTHER): Payer: Medicaid Other | Admitting: Family Medicine

## 2013-11-09 VITALS — BP 125/80 | Temp 97.6°F | Wt 314.6 lb

## 2013-11-09 DIAGNOSIS — O24419 Gestational diabetes mellitus in pregnancy, unspecified control: Secondary | ICD-10-CM

## 2013-11-09 DIAGNOSIS — O99210 Obesity complicating pregnancy, unspecified trimester: Secondary | ICD-10-CM

## 2013-11-09 DIAGNOSIS — E669 Obesity, unspecified: Secondary | ICD-10-CM | POA: Insufficient documentation

## 2013-11-09 DIAGNOSIS — O99810 Abnormal glucose complicating pregnancy: Secondary | ICD-10-CM

## 2013-11-09 DIAGNOSIS — Z713 Dietary counseling and surveillance: Secondary | ICD-10-CM | POA: Insufficient documentation

## 2013-11-09 DIAGNOSIS — O0990 Supervision of high risk pregnancy, unspecified, unspecified trimester: Secondary | ICD-10-CM

## 2013-11-09 DIAGNOSIS — Z6841 Body Mass Index (BMI) 40.0 and over, adult: Secondary | ICD-10-CM

## 2013-11-09 DIAGNOSIS — Z23 Encounter for immunization: Secondary | ICD-10-CM

## 2013-11-09 LAB — POCT URINALYSIS DIP (DEVICE)
GLUCOSE, UA: NEGATIVE mg/dL
HGB URINE DIPSTICK: NEGATIVE
KETONES UR: 40 mg/dL — AB
Nitrite: NEGATIVE
Protein, ur: 30 mg/dL — AB
Urobilinogen, UA: 1 mg/dL (ref 0.0–1.0)
pH: 6 (ref 5.0–8.0)

## 2013-11-09 MED ORDER — TETANUS-DIPHTH-ACELL PERTUSSIS 5-2.5-18.5 LF-MCG/0.5 IM SUSP
0.5000 mL | Freq: Once | INTRAMUSCULAR | Status: AC
  Administered 2013-11-09: 0.5 mL via INTRAMUSCULAR

## 2013-11-09 MED ORDER — GLYBURIDE 5 MG PO TABS: 5.0000 mg | ORAL_TABLET | Freq: Two times a day (BID) | ORAL | Status: DC

## 2013-11-09 NOTE — Patient Instructions (Signed)
Third Trimester of Pregnancy  The third trimester is from week 29 through week 42, months 7 through 9. The third trimester is a time when the fetus is growing rapidly. At the end of the ninth month, the fetus is about 20 inches in length and weighs 6 10 pounds.   BODY CHANGES  Your body goes through many changes during pregnancy. The changes vary from woman to woman.    Your weight will continue to increase. You can expect to gain 25 35 pounds (11 16 kg) by the end of the pregnancy.   You may begin to get stretch marks on your hips, abdomen, and breasts.   You may urinate more often because the fetus is moving lower into your pelvis and pressing on your bladder.   You may develop or continue to have heartburn as a result of your pregnancy.   You may develop constipation because certain hormones are causing the muscles that push waste through your intestines to slow down.   You may develop hemorrhoids or swollen, bulging veins (varicose veins).   You may have pelvic pain because of the weight gain and pregnancy hormones relaxing your joints between the bones in your pelvis. Back aches may result from over exertion of the muscles supporting your posture.   Your breasts will continue to grow and be tender. A yellow discharge may leak from your breasts called colostrum.   Your belly button may stick out.   You may feel short of breath because of your expanding uterus.   You may notice the fetus "dropping," or moving lower in your abdomen.   You may have a bloody mucus discharge. This usually occurs a few days to a week before labor begins.   Your cervix becomes thin and soft (effaced) near your due date.  WHAT TO EXPECT AT YOUR PRENATAL EXAMS   You will have prenatal exams every 2 weeks until week 36. Then, you will have weekly prenatal exams. During a routine prenatal visit:   You will be weighed to make sure you and the fetus are growing normally.   Your blood pressure is taken.   Your abdomen will be  measured to track your baby's growth.   The fetal heartbeat will be listened to.   Any test results from the previous visit will be discussed.   You may have a cervical check near your due date to see if you have effaced.  At around 36 weeks, your caregiver will check your cervix. At the same time, your caregiver will also perform a test on the secretions of the vaginal tissue. This test is to determine if a type of bacteria, Group B streptococcus, is present. Your caregiver will explain this further.  Your caregiver may ask you:   What your birth plan is.   How you are feeling.   If you are feeling the baby move.   If you have had any abnormal symptoms, such as leaking fluid, bleeding, severe headaches, or abdominal cramping.   If you have any questions.  Other tests or screenings that may be performed during your third trimester include:   Blood tests that check for low iron levels (anemia).   Fetal testing to check the health, activity level, and growth of the fetus. Testing is done if you have certain medical conditions or if there are problems during the pregnancy.  FALSE LABOR  You may feel small, irregular contractions that eventually go away. These are called Braxton Hicks contractions, or   false labor. Contractions may last for hours, days, or even weeks before true labor sets in. If contractions come at regular intervals, intensify, or become painful, it is best to be seen by your caregiver.   SIGNS OF LABOR    Menstrual-like cramps.   Contractions that are 5 minutes apart or less.   Contractions that start on the top of the uterus and spread down to the lower abdomen and back.   A sense of increased pelvic pressure or back pain.   A watery or bloody mucus discharge that comes from the vagina.  If you have any of these signs before the 37th week of pregnancy, call your caregiver right away. You need to go to the hospital to get checked immediately.  HOME CARE INSTRUCTIONS    Avoid all  smoking, herbs, alcohol, and unprescribed drugs. These chemicals affect the formation and growth of the baby.   Follow your caregiver's instructions regarding medicine use. There are medicines that are either safe or unsafe to take during pregnancy.   Exercise only as directed by your caregiver. Experiencing uterine cramps is a good sign to stop exercising.   Continue to eat regular, healthy meals.   Wear a good support bra for breast tenderness.   Do not use hot tubs, steam rooms, or saunas.   Wear your seat belt at all times when driving.   Avoid raw meat, uncooked cheese, cat litter boxes, and soil used by cats. These carry germs that can cause birth defects in the baby.   Take your prenatal vitamins.   Try taking a stool softener (if your caregiver approves) if you develop constipation. Eat more high-fiber foods, such as fresh vegetables or fruit and whole grains. Drink plenty of fluids to keep your urine clear or pale yellow.   Take warm sitz baths to soothe any pain or discomfort caused by hemorrhoids. Use hemorrhoid cream if your caregiver approves.   If you develop varicose veins, wear support hose. Elevate your feet for 15 minutes, 3 4 times a day. Limit salt in your diet.   Avoid heavy lifting, wear low heal shoes, and practice good posture.   Rest a lot with your legs elevated if you have leg cramps or low back pain.   Visit your dentist if you have not gone during your pregnancy. Use a soft toothbrush to brush your teeth and be gentle when you floss.   A sexual relationship may be continued unless your caregiver directs you otherwise.   Do not travel far distances unless it is absolutely necessary and only with the approval of your caregiver.   Take prenatal classes to understand, practice, and ask questions about the labor and delivery.   Make a trial run to the hospital.   Pack your hospital bag.   Prepare the baby's nursery.   Continue to go to all your prenatal visits as directed  by your caregiver.  SEEK MEDICAL CARE IF:   You are unsure if you are in labor or if your water has broken.   You have dizziness.   You have mild pelvic cramps, pelvic pressure, or nagging pain in your abdominal area.   You have persistent nausea, vomiting, or diarrhea.   You have a bad smelling vaginal discharge.   You have pain with urination.  SEEK IMMEDIATE MEDICAL CARE IF:    You have a fever.   You are leaking fluid from your vagina.   You have spotting or bleeding from your vagina.     You have severe abdominal cramping or pain.   You have rapid weight loss or gain.   You have shortness of breath with chest pain.   You notice sudden or extreme swelling of your face, hands, ankles, feet, or legs.   You have not felt your baby move in over an hour.   You have severe headaches that do not go away with medicine.   You have vision changes.  Document Released: 07/31/2001 Document Revised: 04/08/2013 Document Reviewed: 10/07/2012  ExitCare Patient Information 2014 [employer].

## 2013-11-09 NOTE — Addendum Note (Signed)
Addended by: Minta BalsamDOM, Reynard Christoffersen R on: 11/09/2013 10:47 AM   Modules accepted: Orders

## 2013-11-09 NOTE — Progress Notes (Signed)
Pulse-92  Pain/pressure-vaginal pressure

## 2013-11-09 NOTE — Telephone Encounter (Addendum)
Message copied by Jill SideAY, Zerina Hallinan L on Mon Nov 09, 2013  4:48 PM ------      Message from: Jolyn LentDOM, MICHAEL R      Created: Mon Nov 09, 2013 12:25 PM       Please coordinate NST biweekly and AFI for A2/B diabetes. Thank you.            MRO  ------ Called pt and informed her that we need to begin twice weekly NST's due to her having Diabetes.  Pt agreed to appt on 3/26 @ 1400.  Pt also agreed to time change for appt on 3/30 to 1000 in order to complete NST and Ob visit.         Lucca Ballo RNC

## 2013-11-09 NOTE — Progress Notes (Signed)
Unable to accurately measure fundal height - q6wk growth scan. Fasting 158/116/141/141 2hB 180/142/261/181/136 2HL 131/121 2HD 125/116/108 Need additional data but limited numbers. Based on current values will start on glyburide 5mg  BID Likely chornic - Fetal echo and optho exam Baseline pro Cr  +FM, no lof, no vb, occ ctx Otherwise no complaints. Improved itching  Monique Schmidt is a 28 y.o. [phone] at 1037w6d here for ROB visit.  Discussed with Patient:  -Plans to breast feed.  All questions answered. -Continue prenatal vitamins. -Reviewed fetal kick counts Pt to perform daily at a time when the baby is active, lie laterally with both hands on belly in quiet room and count all movements (hiccups, shoulder rolls, obvious kicks, etc); pt is to report to clinic L&D for less than 10 movements felt in a one hour time period-pt told as soon as she counts 10 movements the count is complete.  - Routine precautions discussed (depression, infection s/s).   Patient provided with all pertinent phone numbers for emergencies. - RTC for any VB, regular, painful cramps/ctxs occurring at a rate of >2/10 min, fever (100.5 or higher), n/v/d, any pain that is unresolving or worsening, LOF, decreased fetal movement, CP, SOB, edema - RTC in 1 weeks for next appt.   Problems: Patient Active Problem List   Diagnosis Date Noted  . BMI 50.0-59.9, adult 11/09/2013  . Pruritus of pregnancy in third trimester 09/21/2013  . Obesity in pregnancy, antepartum 09/21/2013  . Gestational diabetes mellitus, antepartum 09/21/2013  . Moderate persistent asthma 08/07/2013  . Thrombocytopenia complicating pregnancy 08/03/2013  . Headache in pregnancy, antepartum 07/23/2013  . Supervision of high-risk pregnancy 06/16/2012  . 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 Tdap will be given today 2. F/u in 1 week to meet with provider and review glucose  numbers 3. q6wk growth scan based on body habitus  [ ]  Vaccines: Flu:  Tdap:

## 2013-11-09 NOTE — Addendum Note (Signed)
Addended by: Candelaria StagersHAIZLIP, Aysa Larivee E on: 11/09/2013 11:35 AM   Modules accepted: Orders

## 2013-11-09 NOTE — Progress Notes (Signed)
[facility] ( no available [facility] that would work [facility]

## 2013-11-09 NOTE — Progress Notes (Signed)
DIABETES: Review of need for structure of testing glucose, eating and taking medications.  *States why dietary management is important in controlling blood glucose  Describes the effects of carbohydrates on blood glucose levels  Demonstrates ability to create a balanced meal plan  Demonstrates carbohydrate counting   States when to check blood glucose levels  Demonstrates proper blood glucose monitoring techniques  States the effect of stress and exercise on blood glucose levels  States the importance of limiting caffeine and abstaining from alcohol and smoking  Plan:  Aim for 2 Carb Choices per meal (30 grams) +/- 1 either way for breakfast Aim for 3 Carb Choices per meal (45 grams) +/- 1 either way from lunch and dinner Aim for 1-2 Carbs per snack Continue reading food labels for Total Carbohydrate and sugar grams of foods Consider  increasing your activity level by walking daily as tolerated Begin checking BG before breakfast and 2 hours after first bit of breakfast, lunch and dinner after  as directed by MD  Take medication  as directed by MD  Patient received the following handouts:  Carbohydrate Counting List  Meal Planning worksheet  Patient will be seen for follow-up as needed.

## 2013-11-10 LAB — PROTEIN / CREATININE RATIO, URINE
Creatinine, Urine: 201.7 mg/dL
Protein Creatinine Ratio: 0.07 (ref <0.15)
Total Protein, Urine: 14 mg/dL

## 2013-11-12 ENCOUNTER — Ambulatory Visit (INDEPENDENT_AMBULATORY_CARE_PROVIDER_SITE_OTHER): Payer: Medicaid Other | Admitting: Obstetrics & Gynecology

## 2013-11-12 VITALS — BP 101/67

## 2013-11-12 DIAGNOSIS — O0990 Supervision of high risk pregnancy, unspecified, unspecified trimester: Secondary | ICD-10-CM

## 2013-11-12 DIAGNOSIS — O99810 Abnormal glucose complicating pregnancy: Secondary | ICD-10-CM

## 2013-11-12 DIAGNOSIS — O24419 Gestational diabetes mellitus in pregnancy, unspecified control: Secondary | ICD-10-CM

## 2013-11-12 LAB — POCT URINALYSIS DIP (DEVICE)
Bilirubin Urine: NEGATIVE
Glucose, UA: NEGATIVE mg/dL
HGB URINE DIPSTICK: NEGATIVE
Ketones, ur: NEGATIVE mg/dL
Nitrite: NEGATIVE
PH: 6.5 (ref 5.0–8.0)
Protein, ur: NEGATIVE mg/dL
Specific Gravity, Urine: 1.025 (ref 1.005–1.030)
Urobilinogen, UA: 1 mg/dL (ref 0.0–1.0)

## 2013-11-12 LAB — US OB FOLLOW UP

## 2013-11-12 NOTE — Progress Notes (Signed)
NST performed today was reviewed and was found to be reactive.  Continue recommended antenatal testing and prenatal care. Recently treated for BV after wet prep on 10/29/13 showed moderate clue cells. Wet prep repeated today, will follow up results and manage accordingly. No other complaints or concerns.  Fetal movement and labor precautions reviewed.

## 2013-11-12 NOTE — Progress Notes (Signed)
P = 94   Pt reports having vaginal d/c w/ fishy odor.  She also thinks she has a UTI - CCUA obtained - negative.   Pt to exam room for wet prep

## 2013-11-12 NOTE — Patient Instructions (Signed)
Return to clinic for any obstetric concerns or go to MAU for evaluation  

## 2013-11-13 LAB — WET PREP, GENITAL
Clue Cells Wet Prep HPF POC: NONE SEEN
Trich, Wet Prep: NONE SEEN
WBC WET PREP: NONE SEEN
Yeast Wet Prep HPF POC: NONE SEEN

## 2013-11-13 NOTE — Progress Notes (Signed)
NST reviewed and reactive.  Elnita Surprenant L. Harraway-Smith, M.D., FACOG    

## 2013-11-16 ENCOUNTER — Ambulatory Visit (INDEPENDENT_AMBULATORY_CARE_PROVIDER_SITE_OTHER): Payer: Medicaid Other | Admitting: Family Medicine

## 2013-11-16 VITALS — BP 116/78 | Temp 97.0°F | Wt 318.2 lb

## 2013-11-16 DIAGNOSIS — O99810 Abnormal glucose complicating pregnancy: Secondary | ICD-10-CM

## 2013-11-16 DIAGNOSIS — O0990 Supervision of high risk pregnancy, unspecified, unspecified trimester: Secondary | ICD-10-CM

## 2013-11-16 DIAGNOSIS — Z6841 Body Mass Index (BMI) 40.0 and over, adult: Secondary | ICD-10-CM

## 2013-11-16 DIAGNOSIS — O24419 Gestational diabetes mellitus in pregnancy, unspecified control: Secondary | ICD-10-CM

## 2013-11-16 LAB — POCT URINALYSIS DIP (DEVICE)
Bilirubin Urine: NEGATIVE
Glucose, UA: NEGATIVE mg/dL
Hgb urine dipstick: NEGATIVE
KETONES UR: NEGATIVE mg/dL
NITRITE: NEGATIVE
PROTEIN: NEGATIVE mg/dL
Specific Gravity, Urine: 1.02 (ref 1.005–1.030)
Urobilinogen, UA: 0.2 mg/dL (ref 0.0–1.0)
pH: 7 (ref 5.0–8.0)

## 2013-11-16 MED ORDER — GLYBURIDE 5 MG PO TABS: 10.0000 mg | ORAL_TABLET | Freq: Every day | ORAL | Status: DC

## 2013-11-16 NOTE — Progress Notes (Signed)
Fasting 136/85/118/132/104/90/95/94 2hB 252/111/116/108/152/98/101/84 2hL 150/125/63/157/162/122/168/ 2hD125/180/178/158/197/164 Glyburide 5mg  [employer] to 10mg  for elevated sugars Optho and echo scheduled. Labs reviewed NST/AFI started  Reactive NST  US unable to measure fh acurately 44cm +Fm, no lof, no vb, occassional Ctx No other complaints Monique SprinklesLatasha N Schmidt is a 28 y.o. [id number] at [id number] here for ROB visit.  Discussed with Patient:  -Plans to breast/bottle feed.  All questions answered. -Continue prenatal vitamins. -Reviewed fetal kick counts Pt to perform daily at a time when the baby is active, lie laterally with both hands on belly in quiet room and count all movements (hiccups, shoulder rolls, obvious kicks, etc); pt is to report to clinic L&D for less than 10 movements felt in a one hour time period-pt told as soon as she counts 10 movements the count is complete.  - Routine precautions discussed (depression, infection s/s).   Patient provided with all pertinent phone numbers for emergencies. - RTC for any VB, regular, painful cramps/ctxs occurring at a rate of >2/10 min, fever (100.5 or higher), n/v/d, any pain that is unresolving or worsening, LOF, decreased fetal movement, CP, SOB, edema - RTC in 1 weeks for next appt.  Problems: Patient Active Problem List   Diagnosis Date Noted  . BMI 50.0-59.9, adult 11/09/2013  . Pruritus of pregnancy in third trimester 09/21/2013  . Obesity in pregnancy, antepartum 09/21/2013  . Gestational diabetes mellitus, antepartum 09/21/2013  . Moderate persistent asthma 08/07/2013  . Thrombocytopenia complicating pregnancy 08/03/2013  . Headache in pregnancy, antepartum 07/23/2013  . Supervision of high-risk pregnancy 06/16/2012  . 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   [ ]  Vaccines: recd [ ]  BCM: nexplanon [ ]  Readiness: baby has a place to sleep, car seat, other baby  necessities.  Edu: [x ] PTL precautions

## 2013-11-16 NOTE — Patient Instructions (Signed)
Third Trimester of Pregnancy  The third trimester is from week 29 through week 42, months 7 through 9. The third trimester is a time when the fetus is growing rapidly. At the end of the ninth month, the fetus is about 20 inches in length and weighs 6 10 pounds.   BODY CHANGES  Your body goes through many changes during pregnancy. The changes vary from woman to woman.    Your weight will continue to increase. You can expect to gain 25 35 pounds (11 16 kg) by the end of the pregnancy.   You may begin to get stretch marks on your hips, abdomen, and breasts.   You may urinate more often because the fetus is moving lower into your pelvis and pressing on your bladder.   You may develop or continue to have heartburn as a result of your pregnancy.   You may develop constipation because certain hormones are causing the muscles that push waste through your intestines to slow down.   You may develop hemorrhoids or swollen, bulging veins (varicose veins).   You may have pelvic pain because of the weight gain and pregnancy hormones relaxing your joints between the bones in your pelvis. Back aches may result from over exertion of the muscles supporting your posture.   Your breasts will continue to grow and be tender. A yellow discharge may leak from your breasts called colostrum.   Your belly button may stick out.   You may feel short of breath because of your expanding uterus.   You may notice the fetus "dropping," or moving lower in your abdomen.   You may have a bloody mucus discharge. This usually occurs a few days to a week before labor begins.   Your cervix becomes thin and soft (effaced) near your due date.  WHAT TO EXPECT AT YOUR PRENATAL EXAMS   You will have prenatal exams every 2 weeks until week 36. Then, you will have weekly prenatal exams. During a routine prenatal visit:   You will be weighed to make sure you and the fetus are growing normally.   Your blood pressure is taken.   Your abdomen will be  measured to track your baby's growth.   The fetal heartbeat will be listened to.   Any test results from the previous visit will be discussed.   You may have a cervical check near your due date to see if you have effaced.  At around 36 weeks, your caregiver will check your cervix. At the same time, your caregiver will also perform a test on the secretions of the vaginal tissue. This test is to determine if a type of bacteria, Group B streptococcus, is present. Your caregiver will explain this further.  Your caregiver may ask you:   What your birth plan is.   How you are feeling.   If you are feeling the baby move.   If you have had any abnormal symptoms, such as leaking fluid, bleeding, severe headaches, or abdominal cramping.   If you have any questions.  Other tests or screenings that may be performed during your third trimester include:   Blood tests that check for low iron levels (anemia).   Fetal testing to check the health, activity level, and growth of the fetus. Testing is done if you have certain medical conditions or if there are problems during the pregnancy.  FALSE LABOR  You may feel small, irregular contractions that eventually go away. These are called Braxton Hicks contractions, or   false labor. Contractions may last for hours, days, or even weeks before true labor sets in. If contractions come at regular intervals, intensify, or become painful, it is best to be seen by your caregiver.   SIGNS OF LABOR    Menstrual-like cramps.   Contractions that are 5 minutes apart or less.   Contractions that start on the top of the uterus and spread down to the lower abdomen and back.   A sense of increased pelvic pressure or back pain.   A watery or bloody mucus discharge that comes from the vagina.  If you have any of these signs before the 37th week of pregnancy, call your caregiver right away. You need to go to the hospital to get checked immediately.  HOME CARE INSTRUCTIONS    Avoid all  smoking, herbs, alcohol, and unprescribed drugs. These chemicals affect the formation and growth of the baby.   Follow your caregiver's instructions regarding medicine use. There are medicines that are either safe or unsafe to take during pregnancy.   Exercise only as directed by your caregiver. Experiencing uterine cramps is a good sign to stop exercising.   Continue to eat regular, healthy meals.   Wear a good support bra for breast tenderness.   Do not use hot tubs, steam rooms, or saunas.   Wear your seat belt at all times when driving.   Avoid raw meat, uncooked cheese, cat litter boxes, and soil used by cats. These carry germs that can cause birth defects in the baby.   Take your prenatal vitamins.   Try taking a stool softener (if your caregiver approves) if you develop constipation. Eat more high-fiber foods, such as fresh vegetables or fruit and whole grains. Drink plenty of fluids to keep your urine clear or pale yellow.   Take warm sitz baths to soothe any pain or discomfort caused by hemorrhoids. Use hemorrhoid cream if your caregiver approves.   If you develop varicose veins, wear support hose. Elevate your feet for 15 minutes, 3 4 times a day. Limit salt in your diet.   Avoid heavy lifting, wear low heal shoes, and practice good posture.   Rest a lot with your legs elevated if you have leg cramps or low back pain.   Visit your dentist if you have not gone during your pregnancy. Use a soft toothbrush to brush your teeth and be gentle when you floss.   A sexual relationship may be continued unless your caregiver directs you otherwise.   Do not travel far distances unless it is absolutely necessary and only with the approval of your caregiver.   Take prenatal classes to understand, practice, and ask questions about the labor and delivery.   Make a trial run to the hospital.   Pack your hospital bag.   Prepare the baby's nursery.   Continue to go to all your prenatal visits as directed  by your caregiver.  SEEK MEDICAL CARE IF:   You are unsure if you are in labor or if your water has broken.   You have dizziness.   You have mild pelvic cramps, pelvic pressure, or nagging pain in your abdominal area.   You have persistent nausea, vomiting, or diarrhea.   You have a bad smelling vaginal discharge.   You have pain with urination.  SEEK IMMEDIATE MEDICAL CARE IF:    You have a fever.   You are leaking fluid from your vagina.   You have spotting or bleeding from your vagina.     You have severe abdominal cramping or pain.   You have rapid weight loss or gain.   You have shortness of breath with chest pain.   You notice sudden or extreme swelling of your face, hands, ankles, feet, or legs.   You have not felt your baby move in over an hour.   You have severe headaches that do not go away with medicine.   You have vision changes.  Document Released: 07/31/2001 Document Revised: 04/08/2013 Document Reviewed: 10/07/2012  ExitCare Patient Information 2014 [employer].

## 2013-11-16 NOTE — Progress Notes (Signed)
P - 97 

## 2013-11-19 ENCOUNTER — Ambulatory Visit (HOSPITAL_COMMUNITY)
Admission: RE | Admit: 2013-11-19 | Discharge: 2013-11-19 | Disposition: A | Discharge status: Home / Self Care | Payer: Medicaid Other | Source: Ambulatory Visit | Attending: Family Medicine | Admitting: Family Medicine

## 2013-11-19 DIAGNOSIS — O24419 Gestational diabetes mellitus in pregnancy, unspecified control: Secondary | ICD-10-CM

## 2013-11-19 DIAGNOSIS — O99810 Abnormal glucose complicating pregnancy: Secondary | ICD-10-CM | POA: Insufficient documentation

## 2013-11-19 DIAGNOSIS — O99210 Obesity complicating pregnancy, unspecified trimester: Secondary | ICD-10-CM

## 2013-11-19 DIAGNOSIS — O09299 Supervision of pregnancy with other poor reproductive or obstetric history, unspecified trimester: Secondary | ICD-10-CM | POA: Insufficient documentation

## 2013-11-19 DIAGNOSIS — Z6841 Body Mass Index (BMI) 40.0 and over, adult: Secondary | ICD-10-CM

## 2013-11-19 DIAGNOSIS — E669 Obesity, unspecified: Secondary | ICD-10-CM | POA: Insufficient documentation

## 2013-11-23 ENCOUNTER — Ambulatory Visit (INDEPENDENT_AMBULATORY_CARE_PROVIDER_SITE_OTHER): Payer: Medicaid Other | Admitting: Family Medicine

## 2013-11-23 ENCOUNTER — Encounter: Payer: Self-pay | Admitting: Family Medicine

## 2013-11-23 VITALS — BP 124/63 | Temp 97.9°F | Wt 318.4 lb

## 2013-11-23 DIAGNOSIS — O99810 Abnormal glucose complicating pregnancy: Secondary | ICD-10-CM

## 2013-11-23 DIAGNOSIS — O24419 Gestational diabetes mellitus in pregnancy, unspecified control: Secondary | ICD-10-CM

## 2013-11-23 LAB — OB RESULTS CONSOLE GBS: STREP GROUP B AG: NEGATIVE

## 2013-11-23 MED ORDER — FLUCONAZOLE 150 MG PO TABS: 150.0000 mg | ORAL_TABLET | Freq: Every day | ORAL | Status: DC

## 2013-11-23 NOTE — Progress Notes (Signed)
NST reviewed and reactive. No BS--reports FBS 86 today U/S growth 4/2 6 lb 3 oz 76% GBS today C/o itching--looks like yeast--will treat.

## 2013-11-23 NOTE — Progress Notes (Signed)
P=  C/o of white discharge causing itching and burning-- wet prep today.

## 2013-11-23 NOTE — Patient Instructions (Signed)
Gestational Diabetes Mellitus Gestational diabetes mellitus, often simply referred to as gestational diabetes, is a type of diabetes that some women develop during pregnancy. In gestational diabetes, the pancreas does not make enough insulin (a hormone), the cells are less responsive to the insulin that is made (insulin resistance), or both.Normally, insulin moves sugars from food into the tissue cells. The tissue cells use the sugars for energy. The lack of insulin or the lack of normal response to insulin causes excess sugars to build up in the blood instead of going into the tissue cells. As a result, high blood sugar (hyperglycemia) develops. The effect of high sugar (glucose) levels can cause many complications.  RISK FACTORS You have an increased chance of developing gestational diabetes if you have a family history of diabetes and also have one or more of the following risk factors:  A body mass index over 30 (obesity).  A previous pregnancy with gestational diabetes.  An older age at the time of pregnancy. If blood glucose levels are kept in the normal range during pregnancy, women can have a healthy pregnancy. If your blood glucose levels are not well controlled, there may be risks to you, your unborn baby (fetus), your labor and delivery, or your newborn baby.  SYMPTOMS  If symptoms are experienced, they are much like symptoms you would normally expect during pregnancy. The symptoms of gestational diabetes include:   Increased thirst (polydipsia).  Increased urination (polyuria).  Increased urination during the night (nocturia).  Weight loss. This weight loss may be rapid.  Frequent, recurring infections.  Tiredness (fatigue).  Weakness.  Vision changes, such as blurred vision.  Fruity smell to your breath.  Abdominal pain. DIAGNOSIS Diabetes is diagnosed when blood glucose levels are increased. Your blood glucose level may be checked by one or more of the following  blood tests:  A fasting blood glucose test. You will not be allowed to eat for at least 8 hours before a blood sample is taken.  A random blood glucose test. Your blood glucose is checked at any time of the day regardless of when you ate.  A hemoglobin A1c blood glucose test. A hemoglobin A1c test provides information about blood glucose control over the previous 3 months.  An oral glucose tolerance test (OGTT). Your blood glucose is measured after you have not eaten (fasted) for 1 3 hours and then after you drink a glucose-containing beverage. Since the hormones that cause insulin resistance are highest at about 24 28 weeks of a pregnancy, an OGTT is usually performed during that time. If you have risk factors for gestational diabetes, your caregiver may test you for gestational diabetes earlier than 24 weeks of pregnancy. TREATMENT   You will need to take diabetes medicine or insulin daily to keep blood glucose levels in the desired range.  You will need to match insulin dosing with exercise and healthy food choices. The treatment goal is to maintain the before meal (preprandial), bedtime, and overnight blood glucose level at 60 99 mg/dL during pregnancy. The treatment goal is to further maintain peak after meal blood sugar (postprandial glucose) level at 100 140 mg/dL. HOME CARE INSTRUCTIONS   Have your hemoglobin A1c level checked twice a year.  Perform daily blood glucose monitoring as directed by your caregiver. It is common to perform frequent blood glucose monitoring.  Monitor urine ketones when you are ill and as directed by your caregiver.  Take your diabetes medicine and insulin as directed by your caregiver to maintain   your blood glucose level in the desired range.  Never run out of diabetes medicine or insulin. It is needed every day.  Adjust insulin based on your intake of carbohydrates. Carbohydrates can raise blood glucose levels but need to be included in your diet.  Carbohydrates provide vitamins, minerals, and fiber which are an essential part of a healthy diet. Carbohydrates are found in fruits, vegetables, whole grains, dairy products, legumes, and foods containing added sugars.    Eat healthy foods. Alternate 3 meals with 3 snacks.  Maintain a healthy weight gain. The usual total expected weight gain varies according to your prepregnancy body mass index (BMI).  Carry a medical alert card or wear your medical alert jewelry.  Carry a 15 gram carbohydrate snack with you at all times to treat low blood glucose (hypoglycemia). Some examples of 15 gram carbohydrate snacks include:  Glucose tablets, 3 or 4   Glucose gel, 15 gram tube  Raisins, 2 tablespoons (24 g)  Jelly beans, 6  Animal crackers, 8  Fruit juice, regular soda, or low fat milk, 4 ounces (120 mL)  Gummy treats, 9    Recognize hypoglycemia. Hypoglycemia during pregnancy occurs with blood glucose levels of 60 mg/dL and below. The risk for hypoglycemia increases when fasting or skipping meals, during or after intense exercise, and during sleep. Hypoglycemia symptoms can include:  Tremors or shakes.  Decreased ability to concentrate.  Sweating.  Increased heart rate.  Headache.  Dry mouth.  Hunger.  Irritability.  Anxiety.  Restless sleep.  Altered speech or coordination.  Confusion.  Treat hypoglycemia promptly. If you are alert and able to safely swallow, follow the 15:15 rule:  Take 15 20 grams of rapid-acting glucose or carbohydrate. Rapid-acting options include glucose gel, glucose tablets, or 4 ounces (120 mL) of fruit juice, regular soda, or low fat milk.  Check your blood glucose level 15 minutes after taking the glucose.   Take 15 20 grams more of glucose if the repeat blood glucose level is still 70 mg/dL or below.  Eat a meal or snack within 1 hour once blood glucose levels return to normal.  Be alert to polyuria and polydipsia which are early  signs of hyperglycemia. An early awareness of hyperglycemia allows for prompt treatment. Treat hyperglycemia as directed by your caregiver.  Engage in at least 30 minutes of physical activity a day or as directed by your caregiver. Ten minutes of physical activity timed 30 minutes after each meal is encouraged to control postprandial blood glucose levels.  Adjust your insulin dosing and food intake as needed if you start a new exercise or sport.  Follow your sick day plan at any time you are unable to eat or drink as usual.  Avoid tobacco and alcohol use.  Follow up with your caregiver regularly.  Follow the advice of your caregiver regarding your prenatal and post-delivery (postpartum) appointments, meal planning, exercise, medicines, vitamins, blood tests, other medical tests, and physical activities.  Perform daily skin and foot care. Examine your skin and feet daily for cuts, bruises, redness, nail problems, bleeding, blisters, or sores.  Brush your teeth and gums at least twice a day and floss at least once a day. Follow up with your dentist regularly.  Schedule an eye exam during the first trimester of your pregnancy or as directed by your caregiver.  Share your diabetes management plan with your workplace or school.  Stay up-to-date with immunizations.  Learn to manage stress.  Obtain ongoing diabetes education and   support as needed. SEEK MEDICAL CARE IF:   You are unable to eat food or drink fluids for more than 6 hours.  You have nausea and vomiting for more than 6 hours.  You have a blood glucose level of 200 mg/dL and you have ketones in your urine.  There is a change in mental status.  You develop vision problems.  You have a persistent headache.  You have upper abdominal pain or discomfort.  You develop an additional serious illness.  You have diarrhea for more than 6 hours.  You have been sick or have had a fever for a couple of days and are not getting  better. SEEK IMMEDIATE MEDICAL CARE IF:   You have difficulty breathing.  You no longer feel the baby moving.  You are bleeding or have discharge from your vagina.  You start having premature contractions or labor. MAKE SURE YOU:  Understand these instructions.  Will watch your condition.  Will get help right away if you are not doing well or get worse. Document Released: 11/12/2000 Document Revised: 12/01/2012 Document Reviewed: 03/04/2012 [employer] Patient Information 2014 [employer].  Breastfeeding Deciding to breastfeed is one of the best choices you can make for you and your baby. A change in hormones during pregnancy causes your breast tissue to grow and increases the number and size of your milk ducts. These hormones also allow proteins, sugars, and fats from your blood supply to make breast milk in your milk-producing glands. Hormones prevent breast milk from being released before your baby is born as well as prompt milk flow after birth. Once breastfeeding has begun, thoughts of your baby, as well as his or her sucking or crying, can stimulate the release of milk from your milk-producing glands.  BENEFITS OF BREASTFEEDING For Your Baby  Your first milk (colostrum) helps your baby's digestive system function better.   There are antibodies in your milk that help your baby fight off infections.   Your baby has a lower incidence of asthma, allergies, and sudden infant death syndrome.   The nutrients in breast milk are better for your baby than infant formulas and are designed uniquely for your baby's needs.   Breast milk improves your baby's brain development.   Your baby is less likely to develop other conditions, such as childhood obesity, asthma, or type 2 diabetes mellitus.  For You   Breastfeeding helps to create a very special bond between you and your baby.   Breastfeeding is convenient. Breast milk is always available at the correct temperature and  costs nothing.   Breastfeeding helps to burn calories and helps you lose the weight gained during pregnancy.   Breastfeeding makes your uterus contract to its prepregnancy size faster and slows bleeding (lochia) after you give birth.   Breastfeeding helps to lower your risk of developing type 2 diabetes mellitus, osteoporosis, and breast or ovarian cancer later in life. SIGNS THAT YOUR BABY IS HUNGRY Early Signs of Hunger  Increased alertness or activity.  Stretching.  Movement of the head from side to side.  Movement of the head and opening of the mouth when the corner of the mouth or cheek is stroked (rooting).  Increased sucking sounds, smacking lips, cooing, sighing, or squeaking.  Hand-to-mouth movements.  Increased sucking of fingers or hands. Late Signs of Hunger  Fussing.  Intermittent crying. Extreme Signs of Hunger Signs of extreme hunger will require calming and consoling before your baby will be able to breastfeed successfully. Do not   wait for the following signs of extreme hunger to occur before you initiate breastfeeding:   Restlessness.  A loud, strong cry.   Screaming. BREASTFEEDING BASICS Breastfeeding Initiation  Find a comfortable place to sit or lie down, with your neck and back well supported.  Place a pillow or rolled up blanket under your baby to bring him or her to the level of your breast (if you are seated). Nursing pillows are specially designed to help support your arms and your baby while you breastfeed.  Make sure that your baby's abdomen is facing your abdomen.   Gently massage your breast. With your fingertips, massage from your chest wall toward your nipple in a circular motion. This encourages milk flow. You may need to continue this action during the feeding if your milk flows slowly.  Support your breast with 4 fingers underneath and your thumb above your nipple. Make sure your fingers are well away from your nipple and your  baby's mouth.   Stroke your baby's lips gently with your finger or nipple.   When your baby's mouth is open wide enough, quickly bring your baby to your breast, placing your entire nipple and as much of the colored area around your nipple (areola) as possible into your baby's mouth.   More areola should be visible above your baby's upper lip than below the lower lip.   Your baby's tongue should be between his or her lower gum and your breast.   Ensure that your baby's mouth is correctly positioned around your nipple (latched). Your baby's lips should create a seal on your breast and be turned out (everted).  It is common for your baby to suck about 2 3 minutes in order to start the flow of breast milk. Latching Teaching your baby how to latch on to your breast properly is very important. An improper latch can cause nipple pain and decreased milk supply for you and poor weight gain in your baby. Also, if your baby is not latched onto your nipple properly, he or she may swallow some air during feeding. This can make your baby fussy. Burping your baby when you switch breasts during the feeding can help to get rid of the air. However, teaching your baby to latch on properly is still the best way to prevent fussiness from swallowing air while breastfeeding. Signs that your baby has successfully latched on to your nipple:    Silent tugging or silent sucking, without causing you pain.   Swallowing heard between every 3 4 sucks.    Muscle movement above and in front of his or her ears while sucking.  Signs that your baby has not successfully latched on to nipple:   Sucking sounds or smacking sounds from your baby while breastfeeding.  Nipple pain. If you think your baby has not latched on correctly, slip your finger into the corner of your baby's mouth to break the suction and place it between your baby's gums. Attempt breastfeeding initiation again. Signs of Successful  Breastfeeding Signs from your baby:   A gradual decrease in the number of sucks or complete cessation of sucking.   Falling asleep.   Relaxation of his or her body.   Retention of a small amount of milk in his or her mouth.   Letting go of your breast by himself or herself. Signs from you:  Breasts that have increased in firmness, weight, and size 1 3 hours after feeding.   Breasts that are softer immediately after breastfeeding.    Increased milk volume, as well as a change in milk consistency and color by the 5th day of breastfeeding.   Nipples that are not sore, cracked, or bleeding. Signs That Your Baby is Getting Enough Milk  Wetting at least 3 diapers in a 24-hour period. The urine should be clear and pale yellow by age 5 days.  At least 3 stools in a 24-hour period by age 5 days. The stool should be soft and yellow.  At least 3 stools in a 24-hour period by age 7 days. The stool should be seedy and yellow.  No loss of weight greater than 10% of birth weight during the first 3 days of age.  Average weight gain of 4 7 ounces (120 210 mL) per week after age 4 days.  Consistent daily weight gain by age 5 days, without weight loss after the age of 2 weeks. After a feeding, your baby may spit up a small amount. This is common. BREASTFEEDING FREQUENCY AND DURATION Frequent feeding will help you make more milk and can prevent sore nipples and breast engorgement. Breastfeed when you feel the need to reduce the fullness of your breasts or when your baby shows signs of hunger. This is called "breastfeeding on demand." Avoid introducing a pacifier to your baby while you are working to establish breastfeeding (the first 4 6 weeks after your baby is born). After this time you may choose to use a pacifier. Research has shown that pacifier use during the first year of a baby's life decreases the risk of sudden infant death syndrome (SIDS). Allow your baby to feed on each breast as  long as he or she wants. Breastfeed until your baby is finished feeding. When your baby unlatches or falls asleep while feeding from the first breast, offer the second breast. Because newborns are often sleepy in the first few weeks of life, you may need to awaken your baby to get him or her to feed. Breastfeeding times will vary from baby to baby. However, the following rules can serve as a guide to help you ensure that your baby is properly fed:  Newborns (babies 4 weeks of age or younger) may breastfeed every 1 3 hours.  Newborns should not go longer than 3 hours during the day or 5 hours during the night without breastfeeding.  You should breastfeed your baby a minimum of 8 times in a 24-hour period until you begin to introduce solid foods to your baby at around 6 months of age. BREAST MILK PUMPING Pumping and storing breast milk allows you to ensure that your baby is exclusively fed your breast milk, even at times when you are unable to breastfeed. This is especially important if you are going back to work while you are still breastfeeding or when you are not able to be present during feedings. Your lactation consultant can give you guidelines on how long it is safe to store breast milk.  A breast pump is a machine that allows you to pump milk from your breast into a sterile bottle. The pumped breast milk can then be stored in a refrigerator or freezer. Some breast pumps are operated by hand, while others use electricity. Ask your lactation consultant which type will work best for you. Breast pumps can be purchased, but some hospitals and breastfeeding support groups lease breast pumps on a monthly basis. A lactation consultant can teach you how to hand express breast milk, if you prefer not to use a pump.  CARING FOR   YOUR BREASTS WHILE YOU BREASTFEED Nipples can become dry, cracked, and sore while breastfeeding. The following recommendations can help keep your breasts moisturized and  healthy:  Avoid using soap on your nipples.   Wear a supportive bra. Although not required, special nursing bras and tank tops are designed to allow access to your breasts for breastfeeding without taking off your entire bra or top. Avoid wearing underwire style bras or extremely tight bras.  Air dry your nipples for 3 4minutes after each feeding.   Use only cotton bra pads to absorb leaked breast milk. Leaking of breast milk between feedings is normal.   Use lanolin on your nipples after breastfeeding. Lanolin helps to maintain your skin's normal moisture barrier. If you use pure lanolin you do not need to wash it off before feeding your baby again. Pure lanolin is not toxic to your baby. You may also hand express a few drops of breast milk and gently massage that milk into your nipples and allow the milk to air dry. In the first few weeks after giving birth, some women experience extremely full breasts (engorgement). Engorgement can make your breasts feel heavy, warm, and tender to the touch. Engorgement peaks within 3 5 days after you give birth. The following recommendations can help ease engorgement:  Completely empty your breasts while breastfeeding or pumping. You may want to start by applying warm, moist heat (in the shower or with warm water-soaked hand towels) just before feeding or pumping. This increases circulation and helps the milk flow. If your baby does not completely empty your breasts while breastfeeding, pump any extra milk after he or she is finished.  Wear a snug bra (nursing or regular) or tank top for 1 2 days to signal your body to slightly decrease milk production.  Apply ice packs to your breasts, unless this is too uncomfortable for you.  Make sure that your baby is latched on and positioned properly while breastfeeding. If engorgement persists after 48 hours of following these recommendations, contact your health care provider or a lactation consultant. OVERALL  HEALTH CARE RECOMMENDATIONS WHILE BREASTFEEDING  Eat healthy foods. Alternate between meals and snacks, eating 3 of each per day. Because what you eat affects your breast milk, some of the foods may make your baby more irritable than usual. Avoid eating these foods if you are sure that they are negatively affecting your baby.  Drink milk, fruit juice, and water to satisfy your thirst (about 10 glasses a day).   Rest often, relax, and continue to take your prenatal vitamins to prevent fatigue, stress, and anemia.  Continue breast self-awareness checks.  Avoid chewing and smoking tobacco.  Avoid alcohol and drug use. Some medicines that may be harmful to your baby can pass through breast milk. It is important to ask your health care provider before taking any medicine, including all over-the-counter and prescription medicine as well as vitamin and herbal supplements. It is possible to become pregnant while breastfeeding. If birth control is desired, ask your health care provider about options that will be safe for your baby. SEEK MEDICAL CARE IF:   You feel like you want to stop breastfeeding or have become frustrated with breastfeeding.  You have painful breasts or nipples.  Your nipples are cracked or bleeding.  Your breasts are red, tender, or warm.  You have a swollen area on either breast.  You have a fever or chills.  You have nausea or vomiting.  You have drainage other than breast   milk from your nipples.  Your breasts do not become full before feedings by the 5th day after you give birth.  You feel sad and depressed.  Your baby is too sleepy to eat well.  Your baby is having trouble sleeping.   Your baby is wetting less than 3 diapers in a 24-hour period.  Your baby has less than 3 stools in a 24-hour period.  Your baby's skin or the white part of his or her eyes becomes yellow.   Your baby is not gaining weight by 5 days of age. SEEK IMMEDIATE MEDICAL CARE  IF:   Your baby is overly tired (lethargic) and does not want to wake up and feed.  Your baby develops an unexplained fever. Document Released: 08/06/2005 Document Revised: 04/08/2013 Document Reviewed: 01/28/2013 [employer] Patient Information 2014 [employer].  

## 2013-11-24 LAB — WET PREP, GENITAL
Clue Cells Wet Prep HPF POC: NONE SEEN
Trich, Wet Prep: NONE SEEN
WBC, Wet Prep HPF POC: NONE SEEN

## 2013-11-26 ENCOUNTER — Ambulatory Visit (INDEPENDENT_AMBULATORY_CARE_PROVIDER_SITE_OTHER): Payer: Medicaid Other | Admitting: *Deleted

## 2013-11-26 ENCOUNTER — Encounter: Payer: Self-pay | Admitting: Family Medicine

## 2013-11-26 VITALS — BP 115/61

## 2013-11-26 DIAGNOSIS — O99810 Abnormal glucose complicating pregnancy: Secondary | ICD-10-CM | POA: Diagnosis not present

## 2013-11-26 DIAGNOSIS — O24419 Gestational diabetes mellitus in pregnancy, unspecified control: Secondary | ICD-10-CM

## 2013-11-26 LAB — US OB FOLLOW UP

## 2013-11-26 LAB — CULTURE, BETA STREP (GROUP B ONLY)

## 2013-11-26 NOTE — Progress Notes (Addendum)
P = 96  Category 1 tracing with baseline in 140s.  Multiple accels.

## 2013-11-30 ENCOUNTER — Encounter: Payer: Self-pay | Admitting: Obstetrics & Gynecology

## 2013-11-30 ENCOUNTER — Ambulatory Visit (INDEPENDENT_AMBULATORY_CARE_PROVIDER_SITE_OTHER): Payer: Medicaid Other | Admitting: Obstetrics & Gynecology

## 2013-11-30 VITALS — BP 108/70 | Wt 318.1 lb

## 2013-11-30 DIAGNOSIS — O99119 Other diseases of the blood and blood-forming organs and certain disorders involving the immune mechanism complicating pregnancy, unspecified trimester: Secondary | ICD-10-CM

## 2013-11-30 DIAGNOSIS — O99810 Abnormal glucose complicating pregnancy: Secondary | ICD-10-CM

## 2013-11-30 DIAGNOSIS — D696 Thrombocytopenia, unspecified: Secondary | ICD-10-CM

## 2013-11-30 DIAGNOSIS — D689 Coagulation defect, unspecified: Secondary | ICD-10-CM

## 2013-11-30 DIAGNOSIS — O24419 Gestational diabetes mellitus in pregnancy, unspecified control: Secondary | ICD-10-CM

## 2013-11-30 LAB — CBC
HCT: 35.8 % — ABNORMAL LOW (ref 36.0–46.0)
Hemoglobin: 12 g/dL (ref 12.0–15.0)
MCH: 25.7 pg — ABNORMAL LOW (ref 26.0–34.0)
MCHC: 33.5 g/dL (ref 30.0–36.0)
MCV: 76.7 fL — AB (ref 78.0–100.0)
Platelets: 104 10*3/uL — ABNORMAL LOW (ref 150–400)
RBC: 4.67 MIL/uL (ref 3.87–5.11)
RDW: 16.1 % — AB (ref 11.5–15.5)
WBC: 11.2 10*3/uL — AB (ref 4.0–10.5)

## 2013-11-30 LAB — COMPREHENSIVE METABOLIC PANEL
ALT: 15 U/L (ref 0–35)
AST: 18 U/L (ref 0–37)
Albumin: 3.3 g/dL — ABNORMAL LOW (ref 3.5–5.2)
Alkaline Phosphatase: 102 U/L (ref 39–117)
BILIRUBIN TOTAL: 0.2 mg/dL (ref 0.2–1.2)
BUN: 8 mg/dL (ref 6–23)
CO2: 23 mEq/L (ref 19–32)
CREATININE: 0.52 mg/dL (ref 0.50–1.10)
Calcium: 9.3 mg/dL (ref 8.4–10.5)
Chloride: 105 mEq/L (ref 96–112)
Glucose, Bld: 75 mg/dL (ref 70–99)
Potassium: 3.5 mEq/L (ref 3.5–5.3)
Sodium: 137 mEq/L (ref 135–145)
Total Protein: 6.1 g/dL (ref 6.0–8.3)

## 2013-11-30 LAB — POCT URINALYSIS DIP (DEVICE)
Bilirubin Urine: NEGATIVE
Glucose, UA: NEGATIVE mg/dL
Hgb urine dipstick: NEGATIVE
Ketones, ur: NEGATIVE mg/dL
NITRITE: NEGATIVE
PROTEIN: NEGATIVE mg/dL
Specific Gravity, Urine: 1.02 (ref 1.005–1.030)
UROBILINOGEN UA: 1 mg/dL (ref 0.0–1.0)
pH: 7 (ref 5.0–8.0)

## 2013-11-30 NOTE — Patient Instructions (Signed)
Return to clinic for any obstetric concerns or go to MAU for evaluation  

## 2013-11-30 NOTE — Progress Notes (Signed)
P = 83   US for growth on 4/23

## 2013-11-30 NOTE — Progress Notes (Signed)
Blood sugars Fasting 82/92/ 85/70/99/93 2 hr PP B: 137/119/152/114/114/113  L: 70/230/233/92/120/126 D: 130/116/123/114/92/98 Had two days 4/8 and 4/9 of high BS, normal range after this. Emphasized diet control. Continue Gluburide 10 mg po bid for now, reevaluate next week. 11/19/13 scan EFW 2818g/76%, AFI 16.04, next growth scan 4/23. Will check Hgb A1C today, also CBC (has thrombocytopenia) and CMET. NST performed today was reviewed and was found to be reactive.  Continue recommended antenatal testing and prenatal care. No other complaints or concerns.  Fetal movement and labor precautions reviewed.

## 2013-12-01 LAB — HEMOGLOBIN A1C
Hgb A1c MFr Bld: 7.2 % — ABNORMAL HIGH (ref <5.7)
Mean Plasma Glucose: 160 mg/dL — ABNORMAL HIGH (ref <117)

## 2013-12-03 ENCOUNTER — Ambulatory Visit (INDEPENDENT_AMBULATORY_CARE_PROVIDER_SITE_OTHER): Payer: Medicaid Other | Admitting: *Deleted

## 2013-12-03 DIAGNOSIS — O99810 Abnormal glucose complicating pregnancy: Secondary | ICD-10-CM

## 2013-12-03 DIAGNOSIS — O24419 Gestational diabetes mellitus in pregnancy, unspecified control: Secondary | ICD-10-CM

## 2013-12-03 LAB — US OB FOLLOW UP

## 2013-12-03 NOTE — Progress Notes (Signed)
NST performed today was reviewed and was found to be reactive. Normal AFI at 11.7 cm.  Continue recommended antenatal testing and prenatal care.

## 2013-12-07 ENCOUNTER — Other Ambulatory Visit: Payer: Medicaid Other

## 2013-12-10 ENCOUNTER — Encounter: Payer: Self-pay | Admitting: Family Medicine

## 2013-12-10 ENCOUNTER — Ambulatory Visit (INDEPENDENT_AMBULATORY_CARE_PROVIDER_SITE_OTHER): Payer: Medicaid Other | Admitting: Family Medicine

## 2013-12-10 ENCOUNTER — Ambulatory Visit (HOSPITAL_COMMUNITY)
Admission: RE | Admit: 2013-12-10 | Discharge: 2013-12-10 | Disposition: A | Discharge status: Home / Self Care | Payer: Medicaid Other | Source: Ambulatory Visit | Attending: Family Medicine | Admitting: Family Medicine

## 2013-12-10 VITALS — BP 107/69 | HR 83

## 2013-12-10 DIAGNOSIS — O0990 Supervision of high risk pregnancy, unspecified, unspecified trimester: Secondary | ICD-10-CM

## 2013-12-10 DIAGNOSIS — O24419 Gestational diabetes mellitus in pregnancy, unspecified control: Secondary | ICD-10-CM

## 2013-12-10 DIAGNOSIS — O99810 Abnormal glucose complicating pregnancy: Secondary | ICD-10-CM | POA: Insufficient documentation

## 2013-12-10 MED ORDER — GLYBURIDE 5 MG PO TABS: 10.0000 mg | ORAL_TABLET | Freq: Two times a day (BID) | ORAL | Status: DC

## 2013-12-10 NOTE — Progress Notes (Signed)
+  FM, no lof, no vb, occassional ctx  Pt reports not taking sugars, Pt ran out of glybrudie this week Reactive and reassuring NST Schedule IOL for 39wk ( 28April) 0730 Growth scan 89%tile (AC>97% HC 5%tile - reorder glyburide - continue testing - concern for non compliance - NST monday  Complains of vaginal burning and odor Wet mount collected.

## 2013-12-10 NOTE — Progress Notes (Signed)
Pt states she missed appt on 4/20 due to court date.  US for growth done today.  She c/o vaginal burning and odor.  IOL scheduled on 4/28 @ 0730.

## 2013-12-10 NOTE — Patient Instructions (Signed)
Third Trimester of Pregnancy  The third trimester is from week 29 through week 42, months 7 through 9. The third trimester is a time when the fetus is growing rapidly. At the end of the ninth month, the fetus is about 20 inches in length and weighs 6 10 pounds.   BODY CHANGES  Your body goes through many changes during pregnancy. The changes vary from woman to woman.    Your weight will continue to increase. You can expect to gain 25 35 pounds (11 16 kg) by the end of the pregnancy.   You may begin to get stretch marks on your hips, abdomen, and breasts.   You may urinate more often because the fetus is moving lower into your pelvis and pressing on your bladder.   You may develop or continue to have heartburn as a result of your pregnancy.   You may develop constipation because certain hormones are causing the muscles that push waste through your intestines to slow down.   You may develop hemorrhoids or swollen, bulging veins (varicose veins).   You may have pelvic pain because of the weight gain and pregnancy hormones relaxing your joints between the bones in your pelvis. Back aches may result from over exertion of the muscles supporting your posture.   Your breasts will continue to grow and be tender. A yellow discharge may leak from your breasts called colostrum.   Your belly button may stick out.   You may feel short of breath because of your expanding uterus.   You may notice the fetus "dropping," or moving lower in your abdomen.   You may have a bloody mucus discharge. This usually occurs a few days to a week before labor begins.   Your cervix becomes thin and soft (effaced) near your due date.  WHAT TO EXPECT AT YOUR PRENATAL EXAMS   You will have prenatal exams every 2 weeks until week 36. Then, you will have weekly prenatal exams. During a routine prenatal visit:   You will be weighed to make sure you and the fetus are growing normally.   Your blood pressure is taken.   Your abdomen will be  measured to track your baby's growth.   The fetal heartbeat will be listened to.   Any test results from the previous visit will be discussed.   You may have a cervical check near your due date to see if you have effaced.  At around 36 weeks, your caregiver will check your cervix. At the same time, your caregiver will also perform a test on the secretions of the vaginal tissue. This test is to determine if a type of bacteria, Group B streptococcus, is present. Your caregiver will explain this further.  Your caregiver may ask you:   What your birth plan is.   How you are feeling.   If you are feeling the baby move.   If you have had any abnormal symptoms, such as leaking fluid, bleeding, severe headaches, or abdominal cramping.   If you have any questions.  Other tests or screenings that may be performed during your third trimester include:   Blood tests that check for low iron levels (anemia).   Fetal testing to check the health, activity level, and growth of the fetus. Testing is done if you have certain medical conditions or if there are problems during the pregnancy.  FALSE LABOR  You may feel small, irregular contractions that eventually go away. These are called Braxton Hicks contractions, or   false labor. Contractions may last for hours, days, or even weeks before true labor sets in. If contractions come at regular intervals, intensify, or become painful, it is best to be seen by your caregiver.   SIGNS OF LABOR    Menstrual-like cramps.   Contractions that are 5 minutes apart or less.   Contractions that start on the top of the uterus and spread down to the lower abdomen and back.   A sense of increased pelvic pressure or back pain.   A watery or bloody mucus discharge that comes from the vagina.  If you have any of these signs before the 37th week of pregnancy, call your caregiver right away. You need to go to the hospital to get checked immediately.  HOME CARE INSTRUCTIONS    Avoid all  smoking, herbs, alcohol, and unprescribed drugs. These chemicals affect the formation and growth of the baby.   Follow your caregiver's instructions regarding medicine use. There are medicines that are either safe or unsafe to take during pregnancy.   Exercise only as directed by your caregiver. Experiencing uterine cramps is a good sign to stop exercising.   Continue to eat regular, healthy meals.   Wear a good support bra for breast tenderness.   Do not use hot tubs, steam rooms, or saunas.   Wear your seat belt at all times when driving.   Avoid raw meat, uncooked cheese, cat litter boxes, and soil used by cats. These carry germs that can cause birth defects in the baby.   Take your prenatal vitamins.   Try taking a stool softener (if your caregiver approves) if you develop constipation. Eat more high-fiber foods, such as fresh vegetables or fruit and whole grains. Drink plenty of fluids to keep your urine clear or pale yellow.   Take warm sitz baths to soothe any pain or discomfort caused by hemorrhoids. Use hemorrhoid cream if your caregiver approves.   If you develop varicose veins, wear support hose. Elevate your feet for 15 minutes, 3 4 times a day. Limit salt in your diet.   Avoid heavy lifting, wear low heal shoes, and practice good posture.   Rest a lot with your legs elevated if you have leg cramps or low back pain.   Visit your dentist if you have not gone during your pregnancy. Use a soft toothbrush to brush your teeth and be gentle when you floss.   A sexual relationship may be continued unless your caregiver directs you otherwise.   Do not travel far distances unless it is absolutely necessary and only with the approval of your caregiver.   Take prenatal classes to understand, practice, and ask questions about the labor and delivery.   Make a trial run to the hospital.   Pack your hospital bag.   Prepare the baby's nursery.   Continue to go to all your prenatal visits as directed  by your caregiver.  SEEK MEDICAL CARE IF:   You are unsure if you are in labor or if your water has broken.   You have dizziness.   You have mild pelvic cramps, pelvic pressure, or nagging pain in your abdominal area.   You have persistent nausea, vomiting, or diarrhea.   You have a bad smelling vaginal discharge.   You have pain with urination.  SEEK IMMEDIATE MEDICAL CARE IF:    You have a fever.   You are leaking fluid from your vagina.   You have spotting or bleeding from your vagina.     You have severe abdominal cramping or pain.   You have rapid weight loss or gain.   You have shortness of breath with chest pain.   You notice sudden or extreme swelling of your face, hands, ankles, feet, or legs.   You have not felt your baby move in over an hour.   You have severe headaches that do not go away with medicine.   You have vision changes.  Document Released: 07/31/2001 Document Revised: 04/08/2013 Document Reviewed: 10/07/2012  ExitCare Patient Information 2014 [employer].

## 2013-12-11 ENCOUNTER — Telehealth: Payer: Self-pay | Admitting: Family Medicine

## 2013-12-11 ENCOUNTER — Telehealth (HOSPITAL_COMMUNITY): Payer: Self-pay | Admitting: *Deleted

## 2013-12-11 LAB — WET PREP, GENITAL
Clue Cells Wet Prep HPF POC: NONE SEEN
TRICH WET PREP: NONE SEEN

## 2013-12-11 NOTE — Telephone Encounter (Signed)
Preadmission screen  

## 2013-12-14 ENCOUNTER — Ambulatory Visit (INDEPENDENT_AMBULATORY_CARE_PROVIDER_SITE_OTHER): Payer: Medicaid Other | Admitting: *Deleted

## 2013-12-14 VITALS — BP 123/67 | HR 95

## 2013-12-14 DIAGNOSIS — O99810 Abnormal glucose complicating pregnancy: Secondary | ICD-10-CM

## 2013-12-14 DIAGNOSIS — O24419 Gestational diabetes mellitus in pregnancy, unspecified control: Secondary | ICD-10-CM

## 2013-12-14 NOTE — Progress Notes (Signed)
NST reviewed and reactive.  

## 2013-12-14 NOTE — Progress Notes (Signed)
IOL scheduled tomorrow @ 0730 

## 2013-12-15 ENCOUNTER — Encounter (HOSPITAL_COMMUNITY): Payer: Self-pay

## 2013-12-15 ENCOUNTER — Inpatient Hospital Stay (HOSPITAL_COMMUNITY)
Admission: RE | Admit: 2013-12-15 | Discharge: 2013-12-17 | DRG: 774 | Disposition: A | Discharge status: Home / Self Care | Payer: Medicaid Other | Source: Ambulatory Visit | Attending: Obstetrics & Gynecology | Admitting: Obstetrics & Gynecology

## 2013-12-15 VITALS — BP 130/89 | HR 88 | Temp 97.9°F | Resp 20 | Ht 66.0 in | Wt 318.0 lb

## 2013-12-15 DIAGNOSIS — E669 Obesity, unspecified: Secondary | ICD-10-CM | POA: Diagnosis present

## 2013-12-15 DIAGNOSIS — D696 Thrombocytopenia, unspecified: Secondary | ICD-10-CM

## 2013-12-15 DIAGNOSIS — O99814 Abnormal glucose complicating childbirth: Secondary | ICD-10-CM

## 2013-12-15 DIAGNOSIS — Z6841 Body Mass Index (BMI) 40.0 and over, adult: Secondary | ICD-10-CM

## 2013-12-15 DIAGNOSIS — L299 Pruritus, unspecified: Secondary | ICD-10-CM

## 2013-12-15 DIAGNOSIS — O864 Pyrexia of unknown origin following delivery: Secondary | ICD-10-CM

## 2013-12-15 DIAGNOSIS — O9912 Other diseases of the blood and blood-forming organs and certain disorders involving the immune mechanism complicating childbirth: Secondary | ICD-10-CM

## 2013-12-15 DIAGNOSIS — O99214 Obesity complicating childbirth: Secondary | ICD-10-CM

## 2013-12-15 DIAGNOSIS — D689 Coagulation defect, unspecified: Secondary | ICD-10-CM | POA: Diagnosis present

## 2013-12-15 DIAGNOSIS — O99713 Diseases of the skin and subcutaneous tissue complicating pregnancy, third trimester: Secondary | ICD-10-CM

## 2013-12-15 DIAGNOSIS — O24419 Gestational diabetes mellitus in pregnancy, unspecified control: Secondary | ICD-10-CM

## 2013-12-15 DIAGNOSIS — Z3490 Encounter for supervision of normal pregnancy, unspecified, unspecified trimester: Secondary | ICD-10-CM

## 2013-12-15 LAB — CBC
HCT: 38.6 % (ref 36.0–46.0)
HCT: 38.7 % (ref 36.0–46.0)
HEMOGLOBIN: 13.1 g/dL (ref 12.0–15.0)
Hemoglobin: 13.2 g/dL (ref 12.0–15.0)
MCH: 27.2 pg (ref 26.0–34.0)
MCH: 27.6 pg (ref 26.0–34.0)
MCHC: 33.9 g/dL (ref 30.0–36.0)
MCHC: 34.1 g/dL (ref 30.0–36.0)
MCV: 80.2 fL (ref 78.0–100.0)
MCV: 80.8 fL (ref 78.0–100.0)
PLATELETS: 75 10*3/uL — AB (ref 150–400)
Platelets: 86 10*3/uL — ABNORMAL LOW (ref 150–400)
RBC: 4.81 MIL/uL (ref 3.87–5.11)
RBC: 4.79 MIL/uL (ref 3.87–5.11)
RDW: 16.2 % — ABNORMAL HIGH (ref 11.5–15.5)
RDW: 16.1 % — AB (ref 11.5–15.5)
WBC: 9.7 10*3/uL (ref 4.0–10.5)
WBC: 8.6 10*3/uL (ref 4.0–10.5)

## 2013-12-15 LAB — RPR

## 2013-12-15 LAB — GLUCOSE, CAPILLARY
GLUCOSE-CAPILLARY: 184 mg/dL — AB (ref 70–99)
GLUCOSE-CAPILLARY: 83 mg/dL (ref 70–99)
GLUCOSE-CAPILLARY: 96 mg/dL (ref 70–99)
GLUCOSE-CAPILLARY: 93 mg/dL (ref 70–99)
Glucose-Capillary: 131 mg/dL — ABNORMAL HIGH (ref 70–99)
Glucose-Capillary: 136 mg/dL — ABNORMAL HIGH (ref 70–99)
Glucose-Capillary: 114 mg/dL — ABNORMAL HIGH (ref 70–99)
Glucose-Capillary: 101 mg/dL — ABNORMAL HIGH (ref 70–99)
Glucose-Capillary: 94 mg/dL (ref 70–99)

## 2013-12-15 LAB — TYPE AND SCREEN
ABO/RH(D): A POS
Antibody Screen: NEGATIVE

## 2013-12-15 MED ORDER — ONDANSETRON HCL 4 MG/2ML IJ SOLN: 4.0000 mg | Freq: Four times a day (QID) | INTRAMUSCULAR | Status: DC | PRN

## 2013-12-15 MED ORDER — DIPHENHYDRAMINE HCL 25 MG PO CAPS
25.0000 mg | ORAL_CAPSULE | Freq: Four times a day (QID) | ORAL | Status: DC | PRN
  Administered 2013-12-15 – 2013-12-16 (×2): 25 mg via ORAL
  Filled 2013-12-15 (×3): qty 1

## 2013-12-15 MED ORDER — WITCH HAZEL-GLYCERIN EX PADS: 1.0000 "application " | MEDICATED_PAD | CUTANEOUS | Status: DC | PRN

## 2013-12-15 MED ORDER — BENZOCAINE-MENTHOL 20-0.5 % EX AERO
1.0000 "application " | INHALATION_SPRAY | CUTANEOUS | Status: DC | PRN
  Administered 2013-12-16: 1 via TOPICAL
  Filled 2013-12-15: qty 56

## 2013-12-15 MED ORDER — TETANUS-DIPHTH-ACELL PERTUSSIS 5-2.5-18.5 LF-MCG/0.5 IM SUSP: 0.5000 mL | Freq: Once | INTRAMUSCULAR | Status: DC

## 2013-12-15 MED ORDER — OXYTOCIN BOLUS FROM INFUSION: 500.0000 mL | INTRAVENOUS | Status: DC

## 2013-12-15 MED ORDER — LANOLIN HYDROUS EX OINT: TOPICAL_OINTMENT | CUTANEOUS | Status: DC | PRN

## 2013-12-15 MED ORDER — OXYCODONE-ACETAMINOPHEN 5-325 MG PO TABS
1.0000 | ORAL_TABLET | ORAL | Status: DC | PRN
  Administered 2013-12-16 (×2): 2 via ORAL
  Filled 2013-12-15 (×2): qty 2

## 2013-12-15 MED ORDER — ACETAMINOPHEN 325 MG PO TABS
650.0000 mg | ORAL_TABLET | ORAL | Status: DC | PRN
  Administered 2013-12-15: 650 mg via ORAL
  Filled 2013-12-15: qty 2

## 2013-12-15 MED ORDER — LACTATED RINGERS IV SOLN
INTRAVENOUS | Status: DC
  Administered 2013-12-15: 08:00:00 via INTRAVENOUS

## 2013-12-15 MED ORDER — DEXTROSE IN LACTATED RINGERS 5 % IV SOLN: INTRAVENOUS | Status: DC

## 2013-12-15 MED ORDER — SENNOSIDES-DOCUSATE SODIUM 8.6-50 MG PO TABS
2.0000 | ORAL_TABLET | ORAL | Status: DC
  Administered 2013-12-15 – 2013-12-16 (×2): 2 via ORAL
  Filled 2013-12-15 (×2): qty 2

## 2013-12-15 MED ORDER — SODIUM CHLORIDE 0.9 % IV SOLN
INTRAVENOUS | Status: DC
  Filled 2013-12-15: qty 1

## 2013-12-15 MED ORDER — ZOLPIDEM TARTRATE 5 MG PO TABS: 5.0000 mg | ORAL_TABLET | Freq: Every evening | ORAL | Status: DC | PRN

## 2013-12-15 MED ORDER — CITRIC ACID-SODIUM CITRATE 334-500 MG/5ML PO SOLN: 30.0000 mL | ORAL | Status: DC | PRN

## 2013-12-15 MED ORDER — SODIUM CHLORIDE 0.9 % IV SOLN
INTRAVENOUS | Status: DC
  Administered 2013-12-15: 1.4 [IU]/h via INTRAVENOUS
  Filled 2013-12-15: qty 1

## 2013-12-15 MED ORDER — LACTATED RINGERS IV SOLN: 500.0000 mL | INTRAVENOUS | Status: DC | PRN

## 2013-12-15 MED ORDER — PRENATAL MULTIVITAMIN CH
1.0000 | ORAL_TABLET | Freq: Every day | ORAL | Status: DC
  Administered 2013-12-16 – 2013-12-17 (×2): 1 via ORAL
  Filled 2013-12-15 (×2): qty 1

## 2013-12-15 MED ORDER — IBUPROFEN 600 MG PO TABS
600.0000 mg | ORAL_TABLET | Freq: Four times a day (QID) | ORAL | Status: DC
  Filled 2013-12-15: qty 1

## 2013-12-15 MED ORDER — DIBUCAINE 1 % RE OINT: 1.0000 "application " | TOPICAL_OINTMENT | RECTAL | Status: DC | PRN

## 2013-12-15 MED ORDER — IBUPROFEN 600 MG PO TABS: 600.0000 mg | ORAL_TABLET | Freq: Four times a day (QID) | ORAL | Status: DC | PRN

## 2013-12-15 MED ORDER — TERBUTALINE SULFATE 1 MG/ML IJ SOLN
0.2500 mg | Freq: Once | INTRAMUSCULAR | Status: DC | PRN
Start: 2013-12-15 — End: 2013-12-15

## 2013-12-15 MED ORDER — OXYTOCIN 40 UNITS IN LACTATED RINGERS INFUSION - SIMPLE MED
1.0000 m[IU]/min | INTRAVENOUS | Status: DC
  Administered 2013-12-15: 1 m[IU]/min via INTRAVENOUS
  Administered 2013-12-15: 3 m[IU]/min via INTRAVENOUS
  Administered 2013-12-15: 4 m[IU]/min via INTRAVENOUS
  Filled 2013-12-15: qty 1000

## 2013-12-15 MED ORDER — ONDANSETRON HCL 4 MG PO TABS: 4.0000 mg | ORAL_TABLET | ORAL | Status: DC | PRN

## 2013-12-15 MED ORDER — SIMETHICONE 80 MG PO CHEW: 80.0000 mg | CHEWABLE_TABLET | ORAL | Status: DC | PRN

## 2013-12-15 MED ORDER — FENTANYL CITRATE 0.05 MG/ML IJ SOLN
100.0000 ug | INTRAMUSCULAR | Status: DC | PRN
  Administered 2013-12-15 (×2): 100 ug via INTRAVENOUS
  Filled 2013-12-15 (×2): qty 2

## 2013-12-15 MED ORDER — ONDANSETRON HCL 4 MG/2ML IJ SOLN: 4.0000 mg | INTRAMUSCULAR | Status: DC | PRN

## 2013-12-15 MED ORDER — OXYTOCIN 40 UNITS IN LACTATED RINGERS INFUSION - SIMPLE MED: 62.5000 mL/h | INTRAVENOUS | Status: DC

## 2013-12-15 MED ORDER — OXYCODONE-ACETAMINOPHEN 5-325 MG PO TABS
1.0000 | ORAL_TABLET | ORAL | Status: DC | PRN
  Administered 2013-12-15: 2 via ORAL
  Filled 2013-12-15: qty 2

## 2013-12-15 MED ORDER — LIDOCAINE HCL (PF) 1 % IJ SOLN
30.0000 mL | INTRAMUSCULAR | Status: DC | PRN
  Filled 2013-12-15: qty 30

## 2013-12-15 NOTE — Progress Notes (Signed)
Monique SprinklesLatasha N Schmidt is a 28 y.o. [phone] at 228w0d by R=12 admitted for induction of labor due to Gestational diabetes.  Subjective: Resting, experiencing increasing pressure and some pain with each contraction. Declines epidural, requests IV pain medicine. Denies LOF.  Objective: BP 96/68  Pulse 77  Temp(Src) 98.4 F (36.9 C) (Oral)  Resp 18  Ht 5\' 6"  (1.676 m)  Wt 144.244 kg (318 lb)  BMI 51.35 kg/m2  LMP 02/20/2013      FHT:  FHR: 150s bpm, variability: moderate (improved, prior minimal variability 1552 to 1610),  accelerations:  Present,  decelerations:  Absent UC:   regular, every 2-4 minutes, pressure > pain SVE:   Dilation: 5 Effacement (%): 50 Station: -2 Exam by:: Cammy CopaJennifer Lineberry, RN  Labs: Lab Results  Component Value Date   WBC 9.7 12/15/2013   HGB 13.1 12/15/2013   HCT 38.6 12/15/2013   MCV 80.2 12/15/2013   PLT 86* 12/15/2013   Results for Monique SprinklesMILLER, Monique N (MRN [id number]) as of 12/15/2013 17:27  Ref. Range 12/15/2013 10:31 12/15/2013 12:05 12/15/2013 13:09 12/15/2013 15:01 12/15/2013 16:04  Glucose-Capillary Latest Range: 70-99 mg/dL 147131 (H) 829136 (H) 562114 (H) 94 83    Assessment / Plan: Induction of labor due to gestational diabetes,  progressing well on pitocin  Labor: IOL: Progressing on Pitocin, will continue to increase then AROM Preeclampsia:  n/a - stable BPs Fetal Wellbeing:  Category I Pain Control:  Fentanyl 100mg  q 1 hr PRN. (Declines epidural) I/D:  GBS (negative) Anticipated MOD:  NSVD DM2: Glucostablizer goal < 120. Currently improved 136-->114-->94-->83 Obesity: monitor for PPH and shoulder dystocia.   Monique PilarAlexander Karamalegos, DO [facility], PGY-1 12/15/2013, 4:21 PM

## 2013-12-15 NOTE — Progress Notes (Signed)
Order placed for CBC. 

## 2013-12-15 NOTE — Progress Notes (Signed)
Delivery table requested.

## 2013-12-15 NOTE — H&P (Signed)
Attestation of Attending Supervision of Advanced Practitioner (CNM/NP): Evaluation and management procedures were performed by the Advanced Practitioner under my supervision and collaboration.  I have reviewed the Advanced Practitioner's note and chart, and I agree with the management and plan.  Rebekah Sprinkle Harraway-Smith 12:06 PM

## 2013-12-15 NOTE — Telephone Encounter (Signed)
complete

## 2013-12-15 NOTE — Progress Notes (Signed)
Beacon Monitor not available. Patient turned to right side and able to trace FHR.

## 2013-12-15 NOTE — Progress Notes (Signed)
Marisa SprinklesLatasha N Barnfield is a 28 y.o. [phone] at 4665w0d by R=12 admitted for induction of labor due to Gestational diabetes.  Subjective: Resting, having significant pain with contractions. Fentanyl having decreased efficacy.  Objective: BP 108/57  Pulse 77  Temp(Src) 98.4 F (36.9 C) (Oral)  Resp 18  Ht 5\' 6"  (1.676 m)  Wt 144.244 kg (318 lb)  BMI 51.35 kg/m2  LMP 02/20/2013      FHT:  FHR: 150s bpm, variability: moderate (improved, prior minimal variability 1552 to 1610),  accelerations:  Present,  decelerations:  Absent UC:   regular, every 2-4 minutes, pressure > pain SVE:   Dilation: 8 Effacement (%): 80;70 Station: -2 Exam by:: Dr. Ike Benedom  Labs: Lab Results  Component Value Date   WBC 9.7 12/15/2013   HGB 13.1 12/15/2013   HCT 38.6 12/15/2013   MCV 80.2 12/15/2013   PLT 86* 12/15/2013   Results for Marisa SprinklesMILLER, Dustyn N (MRN [id number]) as of 12/15/2013 19:11  Ref. Range 12/15/2013 14:01 12/15/2013 15:01 12/15/2013 16:04 12/15/2013 17:10 12/15/2013 18:09  Glucose-Capillary Latest Range: 70-99 mg/dL 147101 (H) 94 83 96 93   Assessment / Plan: Induction of labor due to gestational diabetes,  progressing well on pitocin  Labor: IOL: Progressing on Pitocin, will continue to increase then AROM Preeclampsia:  n/a - stable BPs Fetal Wellbeing:  Category I Pain Control:  Fentanyl 100mg  q 1 hr PRN. Unable to get epidural because of plt of 86. Repeating CBC Thrombocytopenia: normal Blood pressure. Typical through pregnancy I/D:  GBS (negative) Anticipated MOD:  NSVD DM2: Glucostablizer goal < 120. Currently stable at goal Obesity: monitor for [facility] and shoulder dystocia. Also hx of asthma. No hemabate  Tawana ScaleMichael Ryan Macgregor Aeschliman, MD OB Fellow

## 2013-12-15 NOTE — Progress Notes (Signed)
Patient up to bathroom

## 2013-12-15 NOTE — Progress Notes (Signed)
Monique SprinklesLatasha N Schmidt is a 28 y.o. [phone] at 8020w0d by R=12 admitted for induction of labor due to Gestational diabetes.  Subjective:   Objective: BP 122/87  Pulse 82  Temp(Src) 98.5 F (36.9 C) (Oral)  Resp 18  Ht 5\' 6"  (1.676 m)  Wt 144.244 kg (318 lb)  BMI 51.35 kg/m2  LMP 02/20/2013      FHT:  FHR: 150s bpm, variability: moderate,  accelerations:  Present,  decelerations:  Absent UC:   regular, every 2-4 minutes, minimally painful SVE:   Dilation: 4 Effacement (%): 40 Station: -3 Exam by:: Dr. Ike Benedom  Labs: Lab Results  Component Value Date   WBC 9.7 12/15/2013   HGB 13.1 12/15/2013   HCT 38.6 12/15/2013   MCV 80.2 12/15/2013   PLT 86* 12/15/2013   Results for Monique SprinklesMILLER, Monique N (MRN [id number]) as of 12/15/2013 12:36  Ref. Range 12/15/2013 09:27 12/15/2013 10:31 12/15/2013 12:05  Glucose-Capillary Latest Range: 70-99 mg/dL 147184 (H) 829131 (H) 562136 (H)   Assessment / Plan: Induction of labor due to gestational diabetes,  progressing well on pitocin  Labor: Progressing on Pitocin, will continue to increase then AROM Preeclampsia:  no signs or symptoms of toxicity Fetal Wellbeing:  Category I Pain Control:  Epidural and Fentanyl PRN I/D:  n/a Anticipated MOD:  NSVD DM2: Glucostablizer goal < 120. Currently 130s Obesity: monitor for PPH and shoulder dystocia.   Minta BalsamMichael R Abbygale Schmidt 12/15/2013, 12:32 PM

## 2013-12-15 NOTE — H&P (Signed)
Monique Schmidt is a 28 y.o. female @ 7219w0d by L/2 presenting for IOL 2/2 GDM. History  OB History   Grav Para Term Preterm Abortions TAB SAB Ect Mult Living   5 3 3  1  1   3      Past Medical History  Diagnosis Date  . Abnormal Pap smear   . Gestational diabetes     all pregnancies glyburide started 11/4  . Thrombocytopenia   . Headache(784.0)   . Vaginal Pap smear, abnormal   . Hernia, umbilical    Past Surgical History  Procedure Laterality Date  . Wisdom tooth extraction     Family History: family history includes Diabetes in her father and mother; Hypertension in her father; Seizures in her brother. Social History:  reports that she has never smoked. She has never used smokeless tobacco. She reports that she does not drink alcohol or use illicit drugs.   Prenatal Transfer Tool  Maternal Diabetes: Yes:  Diabetes Type:  Insulin/Medication controlled Genetic Screening: Declined tool late to care.  Maternal Ultrasounds/Referrals: Normal Fetal Ultrasounds or other Referrals:  None Maternal Substance Abuse:  No Significant Maternal Medications:  None Significant Maternal Lab Results:  None Other Comments:  None  ROS  Dilation: 2 Effacement (%): 80 Station: -2 Exam by:: Thressa ShellerHeather Hogan, CNM Blood pressure 109/62, pulse 104, temperature 97.8 F (36.6 C), temperature source Oral, resp. rate 18, height 5\' 6"  (1.676 m), weight 144.244 kg (318 lb), last menstrual period 02/20/2013. Exam Physical Exam  Nursing note and vitals reviewed. Constitutional: She is oriented to person, place, and time. She appears well-developed and well-nourished. No distress.  Cardiovascular: Normal rate.   Respiratory: Effort normal.  GI: Soft. There is no tenderness.  Neurological: She is alert and oriented to person, place, and time.  Skin: Skin is warm and dry.  Psychiatric: She has a normal mood and affect.    Prenatal labs: ABO, Rh: A/POS/-- (12/04 0943) Antibody: NEG (12/04  0943) Rubella: 6.62 (12/04 0943) RPR: NON REAC (02/02 1130)  HBsAg: NEGATIVE (12/04 0943)  HIV: NON REACTIVE (02/02 1130)  GBS: Negative (04/06 0000)   Assessment/Plan: 28 y.o. [id number] at 5619w0d here for IOL 2/2 GDM Admit to labor and delivery Routine care Anticipate NSVD Glucose q 4 hours. Low dose pitocin per protocol     Tawnya CrookHeather Donovan Hogan 12/15/2013, 7:59 AM

## 2013-12-16 ENCOUNTER — Telehealth: Payer: Self-pay

## 2013-12-16 LAB — GLUCOSE, CAPILLARY: Glucose-Capillary: 151 mg/dL — ABNORMAL HIGH (ref 70–99)

## 2013-12-16 MED ORDER — HYDROCODONE-ACETAMINOPHEN 5-325 MG PO TABS
2.0000 | ORAL_TABLET | Freq: Four times a day (QID) | ORAL | Status: DC | PRN
  Administered 2013-12-16 – 2013-12-17 (×2): 2 via ORAL
  Filled 2013-12-16 (×3): qty 2

## 2013-12-16 MED ORDER — FLUCONAZOLE 150 MG PO TABS: 150.0000 mg | ORAL_TABLET | Freq: Once | ORAL | Status: DC

## 2013-12-16 MED ORDER — METFORMIN HCL 500 MG PO TABS
500.0000 mg | ORAL_TABLET | Freq: Two times a day (BID) | ORAL | Status: DC
  Filled 2013-12-16 (×3): qty 1

## 2013-12-16 MED ORDER — HYDROXYZINE HCL 25 MG PO TABS
25.0000 mg | ORAL_TABLET | ORAL | Status: DC | PRN
  Administered 2013-12-16: 25 mg via ORAL
  Filled 2013-12-16: qty 1

## 2013-12-16 MED ORDER — TRAMADOL HCL 50 MG PO TABS
50.0000 mg | ORAL_TABLET | Freq: Four times a day (QID) | ORAL | Status: DC | PRN
  Administered 2013-12-16 – 2013-12-17 (×4): 50 mg via ORAL
  Filled 2013-12-16 (×4): qty 1

## 2013-12-16 NOTE — Telephone Encounter (Signed)
Called pt and informed pt of her yeast infection.  Pt informed me that she has already taken the medication that starts with "F".  Pt did not have any other questions.

## 2013-12-16 NOTE — Progress Notes (Signed)
Post Partum Day #1 Subjective: Pt reports adequate pain control. Fever up to 101 on two occasions last evening.  Pt denies chills, c/o diaphoresis.  No dyspnea or chest pain.  Tolerating PO well.  Ambulating and voiding. Lochia rubra decreasing.   Objective: Blood pressure 124/84, pulse 82, temperature 98.4 F (36.9 C), temperature source Oral, resp. rate 17, height 5\' 6"  (1.676 m), weight 144.244 kg (318 lb), last menstrual period 02/20/2013, SpO2 100.00%, unknown if currently breastfeeding.  Physical Exam:  General: alert and no distress CV: RRR, No MRG Lungs: CTA BL Lochia: appropriate Uterine Fundus: firm Incision: NA DVT Evaluation: No calf or ankle edema present. Tenderness to palpation of right calf. Homan's negative. No discrepancy in circumference   Recent Labs  12/15/13 0759 12/15/13 1838  HGB 13.1 13.2  HCT 38.6 38.7   Results for orders placed during the hospital encounter of 12/15/13 (from the past 24 hour(s))  GLUCOSE, CAPILLARY     Status: Abnormal   Collection Time    12/15/13  9:27 AM      Result Value Ref Range   Glucose-Capillary 184 (*) 70 - 99 mg/dL  GLUCOSE, CAPILLARY     Status: Abnormal   Collection Time    12/15/13 10:31 AM      Result Value Ref Range   Glucose-Capillary 131 (*) 70 - 99 mg/dL  GLUCOSE, CAPILLARY     Status: Abnormal   Collection Time    12/15/13 12:05 PM      Result Value Ref Range   Glucose-Capillary 136 (*) 70 - 99 mg/dL  GLUCOSE, CAPILLARY     Status: Abnormal   Collection Time    12/15/13  1:09 PM      Result Value Ref Range   Glucose-Capillary 114 (*) 70 - 99 mg/dL  GLUCOSE, CAPILLARY     Status: Abnormal   Collection Time    12/15/13  2:01 PM      Result Value Ref Range   Glucose-Capillary 101 (*) 70 - 99 mg/dL  GLUCOSE, CAPILLARY     Status: None   Collection Time    12/15/13  3:01 PM      Result Value Ref Range   Glucose-Capillary 94  70 - 99 mg/dL  GLUCOSE, CAPILLARY     Status: None   Collection Time   12/15/13  4:04 PM      Result Value Ref Range   Glucose-Capillary 83  70 - 99 mg/dL  GLUCOSE, CAPILLARY     Status: None   Collection Time    12/15/13  5:10 PM      Result Value Ref Range   Glucose-Capillary 96  70 - 99 mg/dL  GLUCOSE, CAPILLARY     Status: None   Collection Time    12/15/13  6:09 PM      Result Value Ref Range   Glucose-Capillary 93  70 - 99 mg/dL  CBC     Status: Abnormal   Collection Time    12/15/13  6:38 PM      Result Value Ref Range   WBC 8.6  4.0 - 10.5 K/uL   RBC 4.79  3.87 - 5.11 MIL/uL   Hemoglobin 13.2  12.0 - 15.0 g/dL   HCT 16.038.7  10.936.0 - 32.346.0 %   MCV 80.8  78.0 - 100.0 fL   MCH 27.6  26.0 - 34.0 pg   MCHC 34.1  30.0 - 36.0 g/dL   RDW 55.716.1 (*) 32.211.5 - 02.515.5 %   Platelets  75 (*) 150 - 400 K/uL  GLUCOSE, CAPILLARY     Status: Abnormal   Collection Time    12/16/13  6:04 AM      Result Value Ref Range   Glucose-Capillary 151 (*) 70 - 99 mg/dL     Assessment/Plan: 28 y.o. [id number] 7578w0d s/p NSVD after induction for GDM. 1. GDM: fasting glucose this morning 151 but was not true fasting.  Pt will need 70mg -3hour Glucose tolerance test at 6 week follow-up. Continue Glyburide.  Pt deferred treatment with Metformin 2. Fever: monitor fever curve today 3. Continue routine postpartum care 4. Breastfeeding and bottlefeeding 5. Plans mirena for contraception.   LOS: 1 day   [name] 12/16/2013, 8:03 AM   I spoke with and examined patient and agree with resident's note and plan of care.  Tawana ScaleMichael Ryan Wilford Merryfield, MD OB Fellow 12/16/2013 9:18 AM

## 2013-12-16 NOTE — Discharge Summary (Signed)
Obstetric Discharge Summary Reason for Admission: induction of labor and For GDM Prenatal Procedures: none Intrapartum Procedures: spontaneous vaginal delivery Postpartum Procedures: none Complications-Operative and Postpartum: none Hemoglobin  Date Value Ref Range Status  12/15/2013 13.2  12.0 - 15.0 g/dL Final  1/61/09604/29/2013 45.412.8   Final     HCT  Date Value Ref Range Status  12/15/2013 38.7  36.0 - 46.0 % Final  12/17/2011 39   Final    Physical Exam:  General: alert and no distress Lochia: appropriate Uterine Fundus: firm Incision: NA DVT Evaluation: No evidence of DVT seen on physical exam. Negative Homan's sign.  Discharge Diagnoses: Term Pregnancy-delivered  Hospital Course: Marisa SprinklesLatasha N Volkman is a 28 y.o. female [phone] who presented @ 1151w0d by L/2  for IOL 2/2 GDM.  At 7:15 PM a viable female was delivered via Vaginal, Spontaneous Delivery (Presentation: Left Occiput Anterior). APGAR: 9, 9; weight .  Placenta status: Intact, Spontaneous. Cord: 3 vessels with the following complications: None. Cord pH: NA  Anesthesia: None  Episiotomy: None  Lacerations: 1st degree  Suture Repair: hemostatic  Est. Blood Loss (mL): 250  Mom to postpartum. Baby to Couplet care / Skin to Skin.  Called to delivery. Mother pushed over 1st degree tear with only nurse in the room. Infant delivered to maternal abdomen. I arrived once infant on maternal abdomen. Cord clamped and cut by sister. Active management of 3rd stage with traction and Pitocin. Placenta delivered intact with 3v cord. Tear hemostatic and did not require a suture EBL 250. Counts correct. Hemostatic.   For her GDM treatment with Metformin was initiated during the postpartum period in leui to prior regimen of Glyburide. Pt was noncompliant in hospital and unlikely to take at home. Patient will need 2hour 75mg  Glucose test at first postpartum follow-up appointment. Pt aware.  The patient was stable and was discharged home on PPD  #2. Breastfeeding and Bottlefeeding with [facility].  Plans Mirena for contraception.  Discharge Information: Date: 12/17/2013 Activity: pelvic rest Diet: routine Medications: Ibuprofen Condition: stable Instructions: refer to practice specific booklet Discharge to: home   Newborn Data: Live born female  Birth Weight: 8 lb 13.8 oz (4020 g) APGAR: 9, 9  Home with mother.

## 2013-12-16 NOTE — Progress Notes (Signed)
UR chart review completed.  

## 2013-12-17 MED ORDER — METFORMIN HCL 500 MG PO TABS: 500.0000 mg | ORAL_TABLET | Freq: Two times a day (BID) | ORAL | Status: DC

## 2013-12-17 MED ORDER — IBUPROFEN 600 MG PO TABS: 600.0000 mg | ORAL_TABLET | Freq: Four times a day (QID) | ORAL | Status: DC

## 2013-12-17 NOTE — Discharge Instructions (Signed)
Breastfeeding °Deciding to breastfeed is one of the best choices you can make for you and your baby. A change in hormones during pregnancy causes your breast tissue to grow and increases the number and size of your milk ducts. These hormones also allow proteins, sugars, and fats from your blood supply to make breast milk in your milk-producing glands. Hormones prevent breast milk from being released before your baby is born as well as prompt milk flow after birth. Once breastfeeding has begun, thoughts of your baby, as well as his or her sucking or crying, can stimulate the release of milk from your milk-producing glands.  °BENEFITS OF BREASTFEEDING °For Your Baby °· Your first milk (colostrum) helps your baby's digestive system function better.   °· There are antibodies in your milk that help your baby fight off infections.   °· Your baby has a lower incidence of asthma, allergies, and sudden infant death syndrome.   °· The nutrients in breast milk are better for your baby than infant formulas and are designed uniquely for your baby's needs.   °· Breast milk improves your baby's brain development.   °· Your baby is less likely to develop other conditions, such as childhood obesity, asthma, or type 2 diabetes mellitus.   °For You  °· Breastfeeding helps to create a very special bond between you and your baby.   °· Breastfeeding is convenient. Breast milk is always available at the correct temperature and costs nothing.   °· Breastfeeding helps to burn calories and helps you lose the weight gained during pregnancy.   °· Breastfeeding makes your uterus contract to its prepregnancy size faster and slows bleeding (lochia) after you give birth.   °· Breastfeeding helps to lower your risk of developing type 2 diabetes mellitus, osteoporosis, and breast or ovarian cancer later in life. °SIGNS THAT YOUR BABY IS HUNGRY °Early Signs of Hunger  °· Increased alertness or activity. °· Stretching. °· Movement of the head from  side to side. °· Movement of the head and opening of the mouth when the corner of the mouth or cheek is stroked (rooting). °· Increased sucking sounds, smacking lips, cooing, sighing, or squeaking. °· Hand-to-mouth movements. °· Increased sucking of fingers or hands. °Late Signs of Hunger °· Fussing. °· Intermittent crying. °Extreme Signs of Hunger °Signs of extreme hunger will require calming and consoling before your baby will be able to breastfeed successfully. Do not wait for the following signs of extreme hunger to occur before you initiate breastfeeding:   °· Restlessness. °· A loud, strong cry. °·  Screaming. °BREASTFEEDING BASICS °Breastfeeding Initiation °· Find a comfortable place to sit or lie down, with your neck and back well supported. °· Place a pillow or rolled up blanket under your baby to bring him or her to the level of your breast (if you are seated). Nursing pillows are specially designed to help support your arms and your baby while you breastfeed. °· Make sure that your baby's abdomen is facing your abdomen.   °· Gently massage your breast. With your fingertips, massage from your chest wall toward your nipple in a circular motion. This encourages milk flow. You may need to continue this action during the feeding if your milk flows slowly. °· Support your breast with 4 fingers underneath and your thumb above your nipple. Make sure your fingers are well away from your nipple and your baby's mouth.   °· Stroke your baby's lips gently with your finger or nipple.   °· When your baby's mouth is open wide enough, quickly bring your baby to your   breast, placing your entire nipple and as much of the colored area around your nipple (areola) as possible into your baby's mouth.   °· More areola should be visible above your baby's upper lip than below the lower lip.   °· Your baby's tongue should be between his or her lower gum and your breast.   °· Ensure that your baby's mouth is correctly positioned  around your nipple (latched). Your baby's lips should create a seal on your breast and be turned out (everted). °· It is common for your baby to suck about 2 3 minutes in order to start the flow of breast milk. °Latching °Teaching your baby how to latch on to your breast properly is very important. An improper latch can cause nipple pain and decreased milk supply for you and poor weight gain in your baby. Also, if your baby is not latched onto your nipple properly, he or she may swallow some air during feeding. This can make your baby fussy. Burping your baby when you switch breasts during the feeding can help to get rid of the air. However, teaching your baby to latch on properly is still the best way to prevent fussiness from swallowing air while breastfeeding. °Signs that your baby has successfully latched on to your nipple:    °· Silent tugging or silent sucking, without causing you pain.   °· Swallowing heard between every 3 4 sucks.   °·  Muscle movement above and in front of his or her ears while sucking.   °Signs that your baby has not successfully latched on to nipple:  °· Sucking sounds or smacking sounds from your baby while breastfeeding. °· Nipple pain. °If you think your baby has not latched on correctly, slip your finger into the corner of your baby's mouth to break the suction and place it between your baby's gums. Attempt breastfeeding initiation again. °Signs of Successful Breastfeeding °Signs from your baby:   °· A gradual decrease in the number of sucks or complete cessation of sucking.   °· Falling asleep.   °· Relaxation of his or her body.   °· Retention of a small amount of milk in his or her mouth.   °· Letting go of your breast by himself or herself. °Signs from you: °· Breasts that have increased in firmness, weight, and size 1 3 hours after feeding.   °· Breasts that are softer immediately after breastfeeding. °· Increased milk volume, as well as a change in milk consistency and color by  the 5th day of breastfeeding.   °· Nipples that are not sore, cracked, or bleeding. °Signs That Your Baby is Getting Enough Milk °· Wetting at least 3 diapers in a 24-hour period. The urine should be clear and pale yellow by age 5 days. °· At least 3 stools in a 24-hour period by age 5 days. The stool should be soft and yellow. °· At least 3 stools in a 24-hour period by age 7 days. The stool should be seedy and yellow. °· No loss of weight greater than 10% of birth weight during the first 3 days of age. °· Average weight gain of 4 7 ounces (120 210 mL) per week after age 4 days. °· Consistent daily weight gain by age 5 days, without weight loss after the age of 2 weeks. °After a feeding, your baby may spit up a small amount. This is common. °BREASTFEEDING FREQUENCY AND DURATION °Frequent feeding will help you make more milk and can prevent sore nipples and breast engorgement. Breastfeed when you feel the need to reduce   the fullness of your breasts or when your baby shows signs of hunger. This is called "breastfeeding on demand." Avoid introducing a pacifier to your baby while you are working to establish breastfeeding (the first 4 6 weeks after your baby is born). After this time you may choose to use a pacifier. Research has shown that pacifier use during the first year of a baby's life decreases the risk of sudden infant death syndrome (SIDS). °Allow your baby to feed on each breast as long as he or she wants. Breastfeed until your baby is finished feeding. When your baby unlatches or falls asleep while feeding from the first breast, offer the second breast. Because newborns are often sleepy in the first few weeks of life, you may need to awaken your baby to get him or her to feed. °Breastfeeding times will vary from baby to baby. However, the following rules can serve as a guide to help you ensure that your baby is properly fed: °· Newborns (babies 4 weeks of age or younger) may breastfeed every 1 3  hours. °· Newborns should not go longer than 3 hours during the day or 5 hours during the night without breastfeeding. °· You should breastfeed your baby a minimum of 8 times in a 24-hour period until you begin to introduce solid foods to your baby at around 6 months of age. °BREAST MILK PUMPING °Pumping and storing breast milk allows you to ensure that your baby is exclusively fed your breast milk, even at times when you are unable to breastfeed. This is especially important if you are going back to work while you are still breastfeeding or when you are not able to be present during feedings. Your lactation consultant can give you guidelines on how long it is safe to store breast milk.  °A breast pump is a machine that allows you to pump milk from your breast into a sterile bottle. The pumped breast milk can then be stored in a refrigerator or freezer. Some breast pumps are operated by hand, while others use electricity. Ask your lactation consultant which type will work best for you. Breast pumps can be purchased, but some hospitals and breastfeeding support groups lease breast pumps on a monthly basis. A lactation consultant can teach you how to hand express breast milk, if you prefer not to use a pump.  °CARING FOR YOUR BREASTS WHILE YOU BREASTFEED °Nipples can become dry, cracked, and sore while breastfeeding. The following recommendations can help keep your breasts moisturized and healthy: °· Avoid using soap on your nipples.   °· Wear a supportive bra. Although not required, special nursing bras and tank tops are designed to allow access to your breasts for breastfeeding without taking off your entire bra or top. Avoid wearing underwire style bras or extremely tight bras. °· Air dry your nipples for 3 4 minutes after each feeding.   °· Use only cotton bra pads to absorb leaked breast milk. Leaking of breast milk between feedings is normal.   °· Use lanolin on your nipples after breastfeeding. Lanolin helps to  maintain your skin's normal moisture barrier. If you use pure lanolin you do not need to wash it off before feeding your baby again. Pure lanolin is not toxic to your baby. You may also hand express a few drops of breast milk and gently massage that milk into your nipples and allow the milk to air dry. °In the first few weeks after giving birth, some women experience extremely full breasts (engorgement). Engorgement can make   your breasts feel heavy, warm, and tender to the touch. Engorgement peaks within 3 5 days after you give birth. The following recommendations can help ease engorgement: °· Completely empty your breasts while breastfeeding or pumping. You may want to start by applying warm, moist heat (in the shower or with warm water-soaked hand towels) just before feeding or pumping. This increases circulation and helps the milk flow. If your baby does not completely empty your breasts while breastfeeding, pump any extra milk after he or she is finished. °· Wear a snug bra (nursing or regular) or tank top for 1 2 days to signal your body to slightly decrease milk production. °· Apply ice packs to your breasts, unless this is too uncomfortable for you. °· Make sure that your baby is latched on and positioned properly while breastfeeding. °If engorgement persists after 48 hours of following these recommendations, contact your health care provider or a lactation consultant. °OVERALL HEALTH CARE RECOMMENDATIONS WHILE BREASTFEEDING °· Eat healthy foods. Alternate between meals and snacks, eating 3 of each per day. Because what you eat affects your breast milk, some of the foods may make your baby more irritable than usual. Avoid eating these foods if you are sure that they are negatively affecting your baby. °· Drink milk, fruit juice, and water to satisfy your thirst (about 10 glasses a day).   °· Rest often, relax, and continue to take your prenatal vitamins to prevent fatigue, stress, and anemia. °· Continue  breast self-awareness checks. °· Avoid chewing and smoking tobacco. °· Avoid alcohol and drug use. °Some medicines that may be harmful to your baby can pass through breast milk. It is important to ask your health care provider before taking any medicine, including all over-the-counter and prescription medicine as well as vitamin and herbal supplements. °It is possible to become pregnant while breastfeeding. If birth control is desired, ask your health care provider about options that will be safe for your baby. °SEEK MEDICAL CARE IF:  °· You feel like you want to stop breastfeeding or have become frustrated with breastfeeding. °· You have painful breasts or nipples. °· Your nipples are cracked or bleeding. °· Your breasts are red, tender, or warm. °· You have a swollen area on either breast. °· You have a fever or chills. °· You have nausea or vomiting. °· You have drainage other than breast milk from your nipples. °· Your breasts do not become full before feedings by the 5th day after you give birth. °· You feel sad and depressed. °· Your baby is too sleepy to eat well. °· Your baby is having trouble sleeping.   °· Your baby is wetting less than 3 diapers in a 24-hour period. °· Your baby has less than 3 stools in a 24-hour period. °· Your baby's skin or the white part of his or her eyes becomes yellow.   °· Your baby is not gaining weight by 5 days of age. °SEEK IMMEDIATE MEDICAL CARE IF:  °· Your baby is overly tired (lethargic) and does not want to wake up and feed. °· Your baby develops an unexplained fever. °Document Released: 08/06/2005 Document Revised: 04/08/2013 Document Reviewed: 01/28/2013 °[employer]® Patient Information ©2014 [employer]. ° °Vaginal Delivery °Care After °Refer to this sheet in the next few weeks. These discharge instructions provide you with information on caring for yourself after delivery. Your caregiver may also give you specific instructions. Your treatment has been planned  according to the most current medical practices available, but problems sometimes occur.   Call your caregiver if you have any problems or questions after you go home. °HOME CARE INSTRUCTIONS °· Take over-the-counter or prescription medicines only as directed by your caregiver or pharmacist. °· Do not drink alcohol, especially if you are breastfeeding or taking medicine to relieve pain. °· Do not chew or smoke tobacco. °· Do not use illegal drugs. °· Continue to use good perineal care. Good perineal care includes: °· Wiping your perineum from front to back. °· Keeping your perineum clean. °· Do not use tampons or douche until your caregiver says it is okay. °· Shower, wash your hair, and take tub baths as directed by your caregiver. °· Wear a well-fitting bra that provides breast support. °· Eat healthy foods. °· Drink enough fluids to keep your urine clear or pale yellow. °· Eat high-fiber foods such as whole grain cereals and breads, brown rice, beans, and fresh fruits and vegetables every day. These foods may help prevent or relieve constipation. °· Follow your cargiver's recommendations regarding resumption of activities such as climbing stairs, driving, lifting, exercising, or traveling. °· Talk to your caregiver about resuming sexual activities. Resumption of sexual activities is dependent upon your risk of infection, your rate of healing, and your comfort and desire to resume sexual activity. °· Try to have someone help you with your household activities and your newborn for at least a few days after you leave the hospital. °· Rest as much as possible. Try to rest or take a nap when your newborn is sleeping. °· Increase your activities gradually. °· Keep all of your scheduled postpartum appointments. It is very important to keep your scheduled follow-up appointments. At these appointments, your caregiver will be checking to make sure that you are healing physically and emotionally. °SEEK MEDICAL CARE IF:   °· You are passing large clots from your vagina. Save any clots to show your caregiver. °· You have a foul smelling discharge from your vagina. °· You have trouble urinating. °· You are urinating frequently. °· You have pain when you urinate. °· You have a change in your bowel movements. °· You have increasing redness, pain, or swelling near your vaginal incision (episiotomy) or vaginal tear. °· You have pus draining from your episiotomy or vaginal tear. °· Your episiotomy or vaginal tear is separating. °· You have painful, hard, or reddened breasts. °· You have a severe headache. °· You have blurred vision or see spots. °· You feel sad or depressed. °· You have thoughts of hurting yourself or your newborn. °· You have questions about your care, the care of your newborn, or medicines. °· You are dizzy or lightheaded. °· You have a rash. °· You have nausea or vomiting. °· You were breastfeeding and have not had a menstrual period within 12 weeks after you stopped breastfeeding. °· You are not breastfeeding and have not had a menstrual period by the 12th week after delivery. °· You have a fever. °SEEK IMMEDIATE MEDICAL CARE IF:  °· You have persistent pain. °· You have chest pain. °· You have shortness of breath. °· You faint. °· You have leg pain. °· You have stomach pain. °· Your vaginal bleeding saturates two or more sanitary pads in 1 hour. °MAKE SURE YOU:  °· Understand these instructions. °· Will watch your condition. °· Will get help right away if you are not doing well or get worse. °Document Released: 08/03/2000 Document Revised: 04/30/2012 Document Reviewed: 04/02/2012 °Exit[employer]® Patient Information ©2014 [employer]. ° °

## 2013-12-17 NOTE — Discharge Summary (Signed)
Attestation of Attending Supervision of Fellow: Evaluation and management procedures were performed by the Fellow under my supervision and collaboration.  I have reviewed the Fellow's note and chart, and I agree with the management and plan.    

## 2014-01-13 ENCOUNTER — Encounter: Payer: Self-pay | Admitting: General Practice

## 2014-01-18 ENCOUNTER — Encounter: Payer: Self-pay | Admitting: Family Medicine

## 2014-01-18 ENCOUNTER — Ambulatory Visit (INDEPENDENT_AMBULATORY_CARE_PROVIDER_SITE_OTHER): Payer: Medicaid Other | Admitting: Family Medicine

## 2014-01-18 MED ORDER — NORGESTIMATE-ETH ESTRADIOL 0.25-35 MG-MCG PO TABS: 1.0000 | ORAL_TABLET | Freq: Every day | ORAL | Status: DC

## 2014-01-18 MED ORDER — PRENATAL VITAMINS 0.8 MG PO TABS: 1.0000 | ORAL_TABLET | Freq: Every day | ORAL | Status: DC

## 2014-01-18 NOTE — Progress Notes (Signed)
Subjective:    Monique Schmidt is a 28 y.o. [phone] African American female who presents for a postpartum visit. She is 6 weeks postpartum following a spontaneous vaginal delivery. I have fully reviewed the prenatal and intrapartum course. The delivery was at 39 gestational weeks. Outcome: spontaneous vaginal delivery. Anesthesia: epidural. Postpartum course has been uncomplicated. She did have gestational DM with this pregnancy but did not fast today for a 2 hour.  Baby's course has been uncomplicated. Baby is feeding by breast. Bleeding no bleeding. Bowel function is normal. Bladder function is normal. Patient is not sexually active. Contraception method is OCP (estrogen/progesterone). Postpartum depression screening: negative.  The following portions of the patient's history were reviewed and updated as appropriate: allergies, current medications, past medical history, past surgical history and problem list.  Review of Systems Pertinent items are noted in HPI.   Filed Vitals:   01/18/14 1538  BP: 129/87  Pulse: 86  Temp: 98.7 F (37.1 C)  TempSrc: Oral  Weight: 304 lb 1.6 oz (137.939 kg)    Objective:     General:  alert, cooperative and no distress   Breasts:  deferred, no complaints  Lungs: clear to auscultation bilaterally  Heart:  regular rate and rhythm  Abdomen: soft, nontender   Vulva: normal  Vagina: normal vagina  Cervix:  closed  Corpus: Well-involuted  Adnexa:  Non-palpable  Rectal Exam: no hemorrhoids        Assessment:   normal postpartum exam 6 wks s/p SVD Depression screening Contraception counseling   Plan:   Contraception: discussed mirena but pt states her aunt just have to have surgery after it perforated her uterus .discussed other long acting forms of birth control but pt declines stating her boyfriend is in prison until next year so will "think about it" wants OCPs in the meantime. orthocyclen lo rxd  Follow up in: 1 year or as needed.

## 2014-01-18 NOTE — Progress Notes (Signed)
Patient states that she is undecided on birth control. She is complaining of pain from her hernia. Thinks she may have BV.

## 2014-01-19 ENCOUNTER — Telehealth: Payer: Self-pay | Admitting: *Deleted

## 2014-01-19 LAB — WET PREP, GENITAL
TRICH WET PREP: NONE SEEN
WBC, Wet Prep HPF POC: NONE SEEN
Yeast Wet Prep HPF POC: NONE SEEN

## 2014-01-19 MED ORDER — METRONIDAZOLE 500 MG PO TABS: 500.0000 mg | ORAL_TABLET | Freq: Two times a day (BID) | ORAL | Status: DC

## 2014-01-19 NOTE — Addendum Note (Signed)
Addended by: Catalina Antigua on: 01/19/2014 09:35 AM   Modules accepted: Orders

## 2014-01-19 NOTE — Telephone Encounter (Signed)
Message copied by Dorothyann Peng on Tue Jan 19, 2014 11:06 AM ------      Message from: Catalina Antigua      Created: Tue Jan 19, 2014  9:35 AM       Please inform patient of positive BV, Rx e-prescribed            Thanks      Peggy ------

## 2014-01-19 NOTE — Telephone Encounter (Signed)
Attempted to call patient, no answer, message left for patient to call clinic but prescription was waiting for her at her pharmacy.

## 2014-01-21 NOTE — Telephone Encounter (Signed)
Called patient, no answer- left message that we are trying to reach you with some results, nothing urgent but please give us a call back at the clinics 

## 2014-01-25 ENCOUNTER — Encounter: Payer: Self-pay | Admitting: General Practice

## 2014-01-25 NOTE — Telephone Encounter (Signed)
Called patient, no answer- left message that we are trying to reach you with some results, nothing urgent but please give [place] a call back. Will send letter

## 2014-02-06 ENCOUNTER — Other Ambulatory Visit: Payer: Self-pay | Admitting: Obstetrics and Gynecology

## 2014-02-24 ENCOUNTER — Other Ambulatory Visit: Payer: Self-pay | Admitting: *Deleted

## 2014-02-24 DIAGNOSIS — B9689 Other specified bacterial agents as the cause of diseases classified elsewhere: Secondary | ICD-10-CM

## 2014-02-24 DIAGNOSIS — N760 Acute vaginitis: Principal | ICD-10-CM

## 2014-02-24 MED ORDER — METRONIDAZOLE 500 MG PO TABS: 500.0000 mg | ORAL_TABLET | Freq: Two times a day (BID) | ORAL | Status: DC

## 2014-02-24 NOTE — Telephone Encounter (Signed)
Pt called and stated she vomited medication for BV, reordered Flagyl.  Encouraged patient to try gel, pt refused.

## 2014-04-15 ENCOUNTER — Ambulatory Visit: Payer: Medicaid Other | Admitting: Family Medicine

## 2014-05-19 ENCOUNTER — Emergency Department (INDEPENDENT_AMBULATORY_CARE_PROVIDER_SITE_OTHER)
Admission: EM | Admit: 2014-05-19 | Discharge: 2014-05-19 | Disposition: A | Discharge status: Home / Self Care | Payer: Medicaid Other | Source: Home / Self Care | Attending: Emergency Medicine | Admitting: Emergency Medicine

## 2014-05-19 ENCOUNTER — Encounter (HOSPITAL_COMMUNITY): Payer: Self-pay | Admitting: Emergency Medicine

## 2014-05-19 DIAGNOSIS — N926 Irregular menstruation, unspecified: Secondary | ICD-10-CM

## 2014-05-19 DIAGNOSIS — R1030 Lower abdominal pain, unspecified: Secondary | ICD-10-CM

## 2014-05-19 DIAGNOSIS — R109 Unspecified abdominal pain: Secondary | ICD-10-CM

## 2014-05-19 LAB — POCT URINALYSIS DIP (DEVICE)
BILIRUBIN URINE: NEGATIVE
Glucose, UA: NEGATIVE mg/dL
Ketones, ur: NEGATIVE mg/dL
LEUKOCYTES UA: NEGATIVE
Nitrite: NEGATIVE
PROTEIN: NEGATIVE mg/dL
Specific Gravity, Urine: 1.025 (ref 1.005–1.030)
UROBILINOGEN UA: 0.2 mg/dL (ref 0.0–1.0)
pH: 5.5 (ref 5.0–8.0)

## 2014-05-19 LAB — POCT PREGNANCY, URINE: Preg Test, Ur: NEGATIVE

## 2014-05-19 LAB — HCG, QUANTITATIVE, PREGNANCY: hCG, Beta Chain, Quant, S: <1 m[IU]/mL (ref <5)

## 2014-05-19 NOTE — ED Provider Notes (Signed)
Medical screening examination/treatment/procedure(s) were performed by non-physician practitioner and as supervising physician I was immediately available for consultation/collaboration.  Leslee Homeavid Marquerite Forsman, M.D.  Reuben Likesavid C Kagen Kunath, MD 05/19/14 [phone]

## 2014-05-19 NOTE — ED Provider Notes (Signed)
CSN: [id number]     Arrival date & time 05/19/14  1823 History   First MD Initiated Contact with Patient 05/19/14 1942     Chief Complaint  Patient presents with  . Hernia  . Possible Pregnancy   (Consider location/radiation/quality/duration/timing/severity/associated sxs/prior Treatment) HPI     28 year old female presents requesting a pregnancy test. She says that she has a hernia that she has already seen a Careers advisersurgeon about. She was told that she had to come here to make sure she is not pregnant because she missed her last period. She says that in previous pregnancy she had negative urine pregnancy test but the blood test was positive. She has abdominal pain from the hernia that is unchanged. She is still having normal bowel movements. No systemic symptoms. No previous history of missed periods apart from when she was pregnant  Past Medical History  Diagnosis Date  . Abnormal Pap smear   . Gestational diabetes     all pregnancies glyburide started 11/4  . Thrombocytopenia   . Headache(784.0)   . Vaginal Pap smear, abnormal   . Hernia, umbilical    Past Surgical History  Procedure Laterality Date  . Wisdom tooth extraction     Family History  Problem Relation Age of Onset  . Hypertension Father   . Diabetes Father   . Diabetes Mother   . Seizures Brother    History  Substance Use Topics  . Smoking status: Never Smoker   . Smokeless tobacco: Never Used  . Alcohol Use: No   OB History   Grav Para Term Preterm Abortions TAB SAB Ect Mult Living   5 4 4  1  1   4      Review of Systems  Gastrointestinal: Positive for abdominal pain. Negative for constipation.  Genitourinary: Positive for menstrual problem.  All other systems reviewed and are negative.   Allergies  Penicillins and Latex  Home Medications   Prior to Admission medications   Medication Sig Start Date End Date Taking? Authorizing Provider  albuterol (PROVENTIL HFA;VENTOLIN HFA) 108 (90 BASE) MCG/ACT  inhaler Inhale 2 puffs into the lungs every 6 (six) hours as needed for wheezing or shortness of breath. 08/03/13   Lesly DukesKelly H Leggett, MD  metroNIDAZOLE (FLAGYL) 500 MG tablet Take 1 tablet (500 mg total) by mouth 2 (two) times daily. 02/24/14   Peggy Constant, MD  norgestimate-ethinyl estradiol (ORTHO-CYCLEN,SPRINTEC,PREVIFEM) 0.25-35 MG-MCG tablet Take 1 tablet by mouth daily. 01/18/14   Vale HavenKeli L Beck, MD  Prenatal Multivit-Min-Fe-FA (PRENATAL VITAMINS) 0.8 MG tablet Take 1 tablet by mouth daily. 01/18/14   Vale HavenKeli L Beck, MD   BP 111/83  Pulse 87  Temp(Src) 97.7 F (36.5 C) (Oral)  Resp 16  SpO2 99%  Breastfeeding? Yes Physical Exam  Nursing note and vitals reviewed. Constitutional: She is oriented to person, place, and time. Vital signs are normal. She appears well-developed and well-nourished. No distress.  HENT:  Head: Normocephalic and atraumatic.  Pulmonary/Chest: Effort normal. No respiratory distress.  Neurological: She is alert and oriented to person, place, and time. She has normal strength. Coordination normal.  Skin: Skin is warm and dry. No rash noted. She is not diaphoretic.  Psychiatric: She has a normal mood and affect. Judgment normal.    ED Course  Procedures (including critical care time) Labs Review Labs Reviewed  POCT URINALYSIS DIP (DEVICE) - Abnormal; Notable for the following:    Hgb urine dipstick SMALL (*)    All other components within normal limits  HCG, QUANTITATIVE, PREGNANCY  POCT PREGNANCY, URINE    Imaging Review No results found.   MDM   1. Missed period   2. Lower abdominal pain    Will send hCG quantitative. She may call the surgeon tomorrow to schedule her preop appointment.    Graylon Good, PA-C 05/19/14 2008

## 2014-05-19 NOTE — Discharge Instructions (Signed)
Abdominal Pain, Women °Abdominal (stomach, pelvic, or belly) pain can be caused by many things. It is important to tell your doctor: °· The location of the pain. °· Does it come and go or is it present all the time? °· Are there things that start the pain (eating certain foods, exercise)? °· Are there other symptoms associated with the pain (fever, nausea, vomiting, diarrhea)? °All of this is helpful to know when trying to find the cause of the pain. °CAUSES  °· Stomach: virus or bacteria infection, or ulcer. °· Intestine: appendicitis (inflamed appendix), regional ileitis (Crohn's disease), ulcerative colitis (inflamed colon), irritable bowel syndrome, diverticulitis (inflamed diverticulum of the colon), or cancer of the stomach or intestine. °· Gallbladder disease or stones in the gallbladder. °· Kidney disease, kidney stones, or infection. °· Pancreas infection or cancer. °· Fibromyalgia (pain disorder). °· Diseases of the female organs: °¨ Uterus: fibroid (non-cancerous) tumors or infection. °¨ Fallopian tubes: infection or tubal pregnancy. °¨ Ovary: cysts or tumors. °¨ Pelvic adhesions (scar tissue). °¨ Endometriosis (uterus lining tissue growing in the pelvis and on the pelvic organs). °¨ Pelvic congestion syndrome (female organs filling up with blood just before the menstrual period). °¨ Pain with the menstrual period. °¨ Pain with ovulation (producing an egg). °¨ Pain with an IUD (intrauterine device, birth control) in the uterus. °¨ Cancer of the female organs. °· Functional pain (pain not caused by a disease, may improve without treatment). °· Psychological pain. °· Depression. °DIAGNOSIS  °Your doctor will decide the seriousness of your pain by doing an examination. °· Blood tests. °· X-rays. °· Ultrasound. °· CT scan (computed tomography, special type of X-ray). °· MRI (magnetic resonance imaging). °· Cultures, for infection. °· Barium enema (dye inserted in the large intestine, to better view it with  X-rays). °· Colonoscopy (looking in intestine with a lighted tube). °· Laparoscopy (minor surgery, looking in abdomen with a lighted tube). °· Major abdominal exploratory surgery (looking in abdomen with a large incision). °TREATMENT  °The treatment will depend on the cause of the pain.  °· Many cases can be observed and treated at home. °· Over-the-counter medicines recommended by your caregiver. °· Prescription medicine. °· Antibiotics, for infection. °· Birth control pills, for painful periods or for ovulation pain. °· Hormone treatment, for endometriosis. °· Nerve blocking injections. °· Physical therapy. °· Antidepressants. °· Counseling with a psychologist or psychiatrist. °· Minor or major surgery. °HOME CARE INSTRUCTIONS  °· Do not take laxatives, unless directed by your caregiver. °· Take over-the-counter pain medicine only if ordered by your caregiver. Do not take aspirin because it can cause an upset stomach or bleeding. °· Try a clear liquid diet (broth or water) as ordered by your caregiver. Slowly move to a bland diet, as tolerated, if the pain is related to the stomach or intestine. °· Have a thermometer and take your temperature several times a day, and record it. °· Bed rest and sleep, if it helps the pain. °· Avoid sexual intercourse, if it causes pain. °· Avoid stressful situations. °· Keep your follow-up appointments and tests, as your caregiver orders. °· If the pain does not go away with medicine or surgery, you may try: °¨ Acupuncture. °¨ Relaxation exercises (yoga, meditation). °¨ Group therapy. °¨ Counseling. °SEEK MEDICAL CARE IF:  °· You notice certain foods cause stomach pain. °· Your home care treatment is not helping your pain. °· You need stronger pain medicine. °· You want your IUD removed. °· You feel faint or   lightheaded. °· You develop nausea and vomiting. °· You develop a rash. °· You are having side effects or an allergy to your medicine. °SEEK IMMEDIATE MEDICAL CARE IF:  °· Your  pain does not go away or gets worse. °· You have a fever. °· Your pain is felt only in portions of the abdomen. The right side could possibly be appendicitis. The left lower portion of the abdomen could be colitis or diverticulitis. °· You are passing blood in your stools (bright red or black tarry stools, with or without vomiting). °· You have blood in your urine. °· You develop chills, with or without a fever. °· You pass out. °MAKE SURE YOU:  °· Understand these instructions. °· Will watch your condition. °· Will get help right away if you are not doing well or get worse. °Document Released: 06/03/2007 Document Revised: 12/21/2013 Document Reviewed: 06/23/2009 °ExitCare® Patient Information ©2015 [employer]. This information is not intended to replace advice given to you by your health care provider. Make sure you discuss any questions you have with your health care provider. ° °

## 2014-05-19 NOTE — ED Notes (Signed)
C/o umbilical hernia onset x3 yrs; painful; 8/10 Also reports poss preg; LMP = 03/26/14 Alert, no signs of acute distress.

## 2014-05-26 ENCOUNTER — Encounter (HOSPITAL_COMMUNITY): Payer: Self-pay | Admitting: *Deleted

## 2014-05-26 ENCOUNTER — Inpatient Hospital Stay (HOSPITAL_COMMUNITY)
Admission: AD | Admit: 2014-05-26 | Discharge: 2014-05-26 | Disposition: A | Discharge status: Home / Self Care | Payer: Medicaid Other | Source: Ambulatory Visit | Attending: Obstetrics & Gynecology | Admitting: Obstetrics & Gynecology

## 2014-05-26 DIAGNOSIS — R1033 Periumbilical pain: Secondary | ICD-10-CM | POA: Diagnosis not present

## 2014-05-26 DIAGNOSIS — N390 Urinary tract infection, site not specified: Secondary | ICD-10-CM | POA: Insufficient documentation

## 2014-05-26 DIAGNOSIS — N898 Other specified noninflammatory disorders of vagina: Secondary | ICD-10-CM | POA: Diagnosis present

## 2014-05-26 LAB — URINALYSIS, ROUTINE W REFLEX MICROSCOPIC
Bilirubin Urine: NEGATIVE
GLUCOSE, UA: NEGATIVE mg/dL
Ketones, ur: NEGATIVE mg/dL
Nitrite: NEGATIVE
Protein, ur: NEGATIVE mg/dL
Specific Gravity, Urine: 1.025 (ref 1.005–1.030)
UROBILINOGEN UA: 0.2 mg/dL (ref 0.0–1.0)
pH: 6 (ref 5.0–8.0)

## 2014-05-26 LAB — URINE MICROSCOPIC-ADD ON

## 2014-05-26 LAB — POCT PREGNANCY, URINE: PREG TEST UR: NEGATIVE

## 2014-05-26 LAB — WET PREP, GENITAL
Clue Cells Wet Prep HPF POC: NONE SEEN
Trich, Wet Prep: NONE SEEN
YEAST WET PREP: NONE SEEN

## 2014-05-26 LAB — HIV ANTIBODY (ROUTINE TESTING W REFLEX): HIV: NONREACTIVE

## 2014-05-26 MED ORDER — ACETAMINOPHEN 500 MG PO TABS
1000.0000 mg | ORAL_TABLET | Freq: Once | ORAL | Status: AC
  Administered 2014-05-26: 1000 mg via ORAL

## 2014-05-26 MED ORDER — ACETAMINOPHEN 500 MG PO TABS
ORAL_TABLET | ORAL | Status: AC
  Filled 2014-05-26: qty 1

## 2014-05-26 MED ORDER — CIPROFLOXACIN HCL 500 MG PO TABS: 500.0000 mg | ORAL_TABLET | Freq: Two times a day (BID) | ORAL | Status: DC

## 2014-05-26 MED ORDER — FLUCONAZOLE 150 MG PO TABS: 150.0000 mg | ORAL_TABLET | Freq: Once | ORAL | Status: DC

## 2014-05-26 NOTE — Discharge Instructions (Signed)

## 2014-05-26 NOTE — MAU Note (Signed)
Patient states she has had lower abdominal pain for about one month. Has a know umbilical hernia. Brown vaginal discharge. Nausea and vomiting for about one month.

## 2014-05-26 NOTE — MAU Provider Note (Signed)
CC: Possible Pregnancy, Hernia and Abdominal Pain    First Provider Initiated Contact with Patient 05/26/14 1757      HPI Monique Schmidt is a 28 y.o. [id number] who presents with onset of 1-2 wks ago of malodorous vaginal discharge, vaginal itch, dysuria, frequency and urgency of urination. She has chronic periumbilical pain from a hernia diagnosed last pregnancy. Missed appointment with [facility] for F/U. BMs normal.  LMP 03/26/14. 5 months postpartum and bottle feeding. On OCPs and missed at least 2 doses early in last 2 cycles, did not double up.  Has a headache now. Gets yeast with abx.   Past Medical History  Diagnosis Date  . Abnormal Pap smear   . Gestational diabetes     all pregnancies glyburide started 11/4  . Thrombocytopenia   . Headache(784.0)   . Vaginal Pap smear, abnormal   . Hernia, umbilical     OB History  Gravida Para Term Preterm AB SAB TAB Ectopic Multiple Living  '5 4 4  1 1    4    ' # Outcome Date GA Lbr Len/2nd Weight Sex Delivery Anes PTL Lv  5 TRM 12/15/13 37w0d06:43 / 00:02 4.02 kg (8 lb 13.8 oz) F SVD None  Y  4 TRM 06/26/12 322w1d1:07 / 00:01 3.232 kg (7 lb 2 oz) F SVD None  Y  3 TRM 03/08/11 3926w1d:27 / 00:02 3.884 kg (8 lb 9 oz) F SVD None  Y     Comments: none  2 TRM 05/19/09 40w31w0d SVD None N Y  1 SAB               Past Surgical History  Procedure Laterality Date  . Wisdom tooth extraction      History   Social History  . Marital Status: Single    Spouse Name: N/A    Number of Children: N/A  . Years of Education: N/A   Occupational History  . Not on file.   Social History Main Topics  . Smoking status: Never Smoker   . Smokeless tobacco: Never Used  . Alcohol Use: No  . Drug Use: No  . Sexual Activity: Yes    Birth Control/ Protection: None, Pill     Comment: lst time 1 week ago   Other Topics Concern  . Not on file   Social History Narrative  . No narrative on file    No current  facility-administered medications on file prior to encounter.   Current Outpatient Prescriptions on File Prior to Encounter  Medication Sig Dispense Refill  . albuterol (PROVENTIL HFA;VENTOLIN HFA) 108 (90 BASE) MCG/ACT inhaler Inhale 2 puffs into the lungs every 6 (six) hours as needed for wheezing or shortness of breath.  1 Inhaler  2  . metroNIDAZOLE (FLAGYL) 500 MG tablet Take 1 tablet (500 mg total) by mouth 2 (two) times daily.  14 tablet  0  . norgestimate-ethinyl estradiol (ORTHO-CYCLEN,SPRINTEC,PREVIFEM) 0.25-35 MG-MCG tablet Take 1 tablet by mouth daily.  1 Package  11  . Prenatal Multivit-Min-Fe-FA (PRENATAL VITAMINS) 0.8 MG tablet Take 1 tablet by mouth daily.  30 tablet  12    Allergies  Allergen Reactions  . Penicillins Itching  . Latex Itching and Rash    ROS Pertinent items in HPI  PHYSICAL EXAM Filed Vitals:   05/26/14 1751  BP: 125/62  Pulse: 79  Temp: 98.6 F (37 C)  Resp: 18   General: Morbidly obese AA female in  no acute distress Cardiovascular: Normal rate Respiratory: Normal effort Abdomen: Soft, nontender Back: No CVAT Extremities: No edema Neurologic: Alert and oriented Speculum exam: NEFG; vagina with malodorous white discharge discharge, no blood; cervix clean Bimanual exam: cervix closed, no CMT; uterus NSSP; no adnexal tenderness or masses   LAB RESULTS Results for orders placed during the hospital encounter of 05/26/14 (from the past 24 hour(s))  URINALYSIS, ROUTINE W REFLEX MICROSCOPIC     Status: Abnormal   Collection Time    05/26/14  5:15 PM      Result Value Ref Range   Color, Urine YELLOW  YELLOW   APPearance CLOUDY (*) CLEAR   Specific Gravity, Urine 1.025  1.005 - 1.030   pH 6.0  5.0 - 8.0   Glucose, UA NEGATIVE  NEGATIVE mg/dL   Hgb urine dipstick MODERATE (*) NEGATIVE   Bilirubin Urine NEGATIVE  NEGATIVE   Ketones, ur NEGATIVE  NEGATIVE mg/dL   Protein, ur NEGATIVE  NEGATIVE mg/dL   Urobilinogen, UA 0.2  0.0 - 1.0 mg/dL    Nitrite NEGATIVE  NEGATIVE   Leukocytes, UA MODERATE (*) NEGATIVE  URINE MICROSCOPIC-ADD ON     Status: Abnormal   Collection Time    05/26/14  5:15 PM      Result Value Ref Range   Squamous Epithelial / LPF MANY (*) RARE   WBC, UA 21-50  <3 WBC/hpf   RBC / HPF 3-6  <3 RBC/hpf   Bacteria, UA MANY (*) RARE  POCT PREGNANCY, URINE     Status: None   Collection Time    05/26/14  5:35 PM      Result Value Ref Range   Preg Test, Ur NEGATIVE  NEGATIVE  WET PREP, GENITAL     Status: Abnormal   Collection Time    05/26/14  6:00 PM      Result Value Ref Range   Yeast Wet Prep HPF POC NONE SEEN  NONE SEEN   Trich, Wet Prep NONE SEEN  NONE SEEN   Clue Cells Wet Prep HPF POC NONE SEEN  NONE SEEN   WBC, Wet Prep HPF POC FEW (*) NONE SEEN    IMAGING No results found.  MAU COURSE Acetaminophen 1000 given> H/A resolved   ASSESSMENT  1. UTI (lower urinary tract infection)   Hx umbilical hernia with chronic abd pain  PLAN Discharge home. See AVS for patient education. Reviewed how to take OCPs and LARC advised.  Advised needs 2 hr OGTT to F/U GDM   Medication List         albuterol 108 (90 BASE) MCG/ACT inhaler  Commonly known as:  PROVENTIL HFA;VENTOLIN HFA  Inhale 2 puffs into the lungs every 6 (six) hours as needed for wheezing or shortness of breath.     ciprofloxacin 500 MG tablet  Commonly known as:  CIPRO  Take 1 tablet (500 mg total) by mouth 2 (two) times daily.     fluconazole 150 MG tablet  Commonly known as:  DIFLUCAN  Take 1 tablet (150 mg total) by mouth once.     Prenatal Vitamins 0.8 MG tablet  Take 1 tablet by mouth daily.       Follow-up Information   Follow up with [facility]    . Schedule an appointment as soon as possible for a visit in 1 week.   Contact information:   [place] [id number] [phone]        Dayten Juba  Freida Busman, CNM 05/26/2014 6:06 PM

## 2014-05-26 NOTE — MAU Note (Addendum)
5 children with pt in [facility], unable to bring other pts to area due to noise

## 2014-05-27 LAB — GC/CHLAMYDIA PROBE AMP
CT Probe RNA: NEGATIVE
GC Probe RNA: NEGATIVE

## 2014-05-27 NOTE — MAU Provider Note (Signed)
Attestation of Attending Supervision of Advanced Practitioner: Evaluation and management procedures were performed by the PA/NP/CNM/OB Fellow under my supervision/collaboration. Chart reviewed and agree with management and plan.  Tamra Koos V 05/27/2014 4:13 PM

## 2014-06-05 ENCOUNTER — Encounter (HOSPITAL_COMMUNITY): Payer: Self-pay | Admitting: Emergency Medicine

## 2014-06-05 ENCOUNTER — Emergency Department (HOSPITAL_COMMUNITY)
Admission: EM | Admit: 2014-06-05 | Discharge: 2014-06-05 | Disposition: A | Discharge status: Home / Self Care | Payer: Medicaid Other | Attending: Emergency Medicine | Admitting: Emergency Medicine

## 2014-06-05 ENCOUNTER — Emergency Department (HOSPITAL_COMMUNITY): Payer: Medicaid Other

## 2014-06-05 DIAGNOSIS — S8391XA Sprain of unspecified site of right knee, initial encounter: Secondary | ICD-10-CM | POA: Insufficient documentation

## 2014-06-05 DIAGNOSIS — D696 Thrombocytopenia, unspecified: Secondary | ICD-10-CM | POA: Insufficient documentation

## 2014-06-05 DIAGNOSIS — W19XXXA Unspecified fall, initial encounter: Secondary | ICD-10-CM

## 2014-06-05 DIAGNOSIS — E669 Obesity, unspecified: Secondary | ICD-10-CM | POA: Diagnosis not present

## 2014-06-05 DIAGNOSIS — Z91040 Latex allergy status: Secondary | ICD-10-CM | POA: Insufficient documentation

## 2014-06-05 DIAGNOSIS — Z8719 Personal history of other diseases of the digestive system: Secondary | ICD-10-CM | POA: Insufficient documentation

## 2014-06-05 DIAGNOSIS — W01198A Fall on same level from slipping, tripping and stumbling with subsequent striking against other object, initial encounter: Secondary | ICD-10-CM | POA: Diagnosis not present

## 2014-06-05 DIAGNOSIS — S8392XA Sprain of unspecified site of left knee, initial encounter: Secondary | ICD-10-CM

## 2014-06-05 DIAGNOSIS — Z880 Allergy status to penicillin: Secondary | ICD-10-CM | POA: Diagnosis not present

## 2014-06-05 DIAGNOSIS — Z8632 Personal history of gestational diabetes: Secondary | ICD-10-CM | POA: Insufficient documentation

## 2014-06-05 DIAGNOSIS — S93402A Sprain of unspecified ligament of left ankle, initial encounter: Secondary | ICD-10-CM | POA: Diagnosis not present

## 2014-06-05 DIAGNOSIS — Y92009 Unspecified place in unspecified non-institutional (private) residence as the place of occurrence of the external cause: Secondary | ICD-10-CM | POA: Diagnosis not present

## 2014-06-05 DIAGNOSIS — S8991XA Unspecified injury of right lower leg, initial encounter: Secondary | ICD-10-CM | POA: Diagnosis present

## 2014-06-05 DIAGNOSIS — Z79899 Other long term (current) drug therapy: Secondary | ICD-10-CM | POA: Insufficient documentation

## 2014-06-05 MED ORDER — OXYCODONE-ACETAMINOPHEN 5-325 MG PO TABS: 1.0000 | ORAL_TABLET | Freq: Once | ORAL | Status: DC

## 2014-06-05 MED ORDER — ONDANSETRON HCL 4 MG/2ML IJ SOLN
4.0000 mg | Freq: Once | INTRAMUSCULAR | Status: AC
  Administered 2014-06-05: 4 mg via INTRAVENOUS
  Filled 2014-06-05: qty 2

## 2014-06-05 MED ORDER — METHOCARBAMOL 500 MG PO TABS: 500.0000 mg | ORAL_TABLET | Freq: Two times a day (BID) | ORAL | Status: DC | PRN

## 2014-06-05 MED ORDER — SODIUM CHLORIDE 0.9 % IV BOLUS (SEPSIS)
1000.0000 mL | Freq: Once | INTRAVENOUS | Status: AC
  Administered 2014-06-05: 1000 mL via INTRAVENOUS

## 2014-06-05 MED ORDER — MORPHINE SULFATE 4 MG/ML IJ SOLN
4.0000 mg | Freq: Once | INTRAMUSCULAR | Status: AC
  Administered 2014-06-05: 4 mg via INTRAVENOUS
  Filled 2014-06-05: qty 1

## 2014-06-05 NOTE — ED Provider Notes (Signed)
Medical screening examination/treatment/procedure(s) were performed by non-physician practitioner and as supervising physician I was immediately available for consultation/collaboration.   EKG Interpretation None        Lyanne CoKevin M Akshath Mccarey, MD 06/05/14 [phone]

## 2014-06-05 NOTE — Discharge Instructions (Signed)
Rest, Ice intermittently (in the first 24-48 hours), Gentle compression with an Ace wrap, and elevate (Limb above the level of the heart) °  °Take up to 800mg of ibuprofen (that is usually 4 over the counter pills)  3 times a day for 5 days. Take with food. ° ° °Please follow with your primary care doctor in the next 2 days for a check-up. They must obtain records for further management.  ° °Do not hesitate to return to the Emergency Department for any new, worsening or concerning symptoms.  ° ° ° °Ankle Sprain °An ankle sprain is an injury to the strong, fibrous tissues (ligaments) that hold the bones of your ankle joint together.  °CAUSES °An ankle sprain is usually caused by a fall or by twisting your ankle. Ankle sprains most commonly occur when you step on the outer edge of your foot, and your ankle turns inward. People who participate in sports are more prone to these types of injuries.  °SYMPTOMS  °· Pain in your ankle. The pain may be present at rest or only when you are trying to stand or walk. °· Swelling. °· Bruising. Bruising may develop immediately or within 1 to 2 days after your injury. °· Difficulty standing or walking, particularly when turning corners or changing directions. °DIAGNOSIS  °Your caregiver will ask you details about your injury and perform a physical exam of your ankle to determine if you have an ankle sprain. During the physical exam, your caregiver will press on and apply pressure to specific areas of your foot and ankle. Your caregiver will try to move your ankle in certain ways. An X-ray exam may be done to be sure a bone was not broken or a ligament did not separate from one of the bones in your ankle (avulsion fracture).  °TREATMENT  °Certain types of braces can help stabilize your ankle. Your caregiver can make a recommendation for this. Your caregiver may recommend the use of medicine for pain. If your sprain is severe, your caregiver may refer you to a surgeon who helps to  restore function to parts of your skeletal system (orthopedist) or a physical therapist. °HOME CARE INSTRUCTIONS  °· Apply ice to your injury for 1-2 days or as directed by your caregiver. Applying ice helps to reduce inflammation and pain. °¨ Put ice in a plastic bag. °¨ Place a towel between your skin and the bag. °¨ Leave the ice on for 15-20 minutes at a time, every 2 hours while you are awake. °· Only take over-the-counter or prescription medicines for pain, discomfort, or fever as directed by your caregiver. °· Elevate your injured ankle above the level of your heart as much as possible for 2-3 days. °· If your caregiver recommends crutches, use them as instructed. Gradually put weight on the affected ankle. Continue to use crutches or a cane until you can walk without feeling pain in your ankle. °· If you have a plaster splint, wear the splint as directed by your caregiver. Do not rest it on anything harder than a pillow for the first 24 hours. Do not put weight on it. Do not get it wet. You may take it off to take a shower or bath. °· You may have been given an elastic bandage to wear around your ankle to provide support. If the elastic bandage is too tight (you have numbness or tingling in your foot or your foot becomes cold and blue), adjust the bandage to make it comfortable. °·   If you have an air splint, you may blow more air into it or let air out to make it more comfortable. You may take your splint off at night and before taking a shower or bath. Wiggle your toes in the splint several times per day to decrease swelling. °SEEK MEDICAL CARE IF:  °· You have rapidly increasing bruising or swelling. °· Your toes feel extremely cold or you lose feeling in your foot. °· Your pain is not relieved with medicine. °SEEK IMMEDIATE MEDICAL CARE IF: °· Your toes are numb or blue. °· You have severe pain that is increasing. °MAKE SURE YOU:  °· Understand these instructions. °· Will watch your condition. °· Will get  help right away if you are not doing well or get worse. °Document Released: 08/06/2005 Document Revised: 04/30/2012 Document Reviewed: 08/18/2011 °Exit[employer]® Patient Information ©2015 [employer]. This information is not intended to replace advice given to you by your health care provider. Make sure you discuss any questions you have with your health care provider. ° °

## 2014-06-05 NOTE — ED Notes (Signed)
Patient transported to X-ray 

## 2014-06-05 NOTE — ED Notes (Signed)
Bed: [id number] Expected date:  Expected time:  Means of arrival:  Comments: EMS 55F ankle injury

## 2014-06-05 NOTE — ED Provider Notes (Signed)
CSN: [id number]     Arrival date & time 06/05/14  2440 History   First MD Initiated Contact with Patient 06/05/14 0248     Chief Complaint  Patient presents with  . Fall  . right leg injury      (Consider location/radiation/quality/duration/timing/severity/associated sxs/prior Treatment) HPI  Monique Schmidt is a 28 y.o. female complaint to right lower leg below the knee. Patient states she was dancing, states that she thinks she tripped over a rug in the right leg twisted in front of her. She has been drinking beer tonight. Pain is severe, 10 out of 10, exacerbated by movement, palpation. She's been nonambulatory since the event. Initially patient was given 3 doses of IV fentanyl and route with relief she states the pain is 10 out of 10 again. States that she cannot properly localize the pain, pain is diffuse below the knee. Endorses decreased sensation to the lower tremor he  Past Medical History  Diagnosis Date  . Abnormal Pap smear   . Gestational diabetes     all pregnancies glyburide started 11/4  . Thrombocytopenia   . Headache(784.0)   . Vaginal Pap smear, abnormal   . Hernia, umbilical    Past Surgical History  Procedure Laterality Date  . Wisdom tooth extraction     Family History  Problem Relation Age of Onset  . Hypertension Father   . Diabetes Father   . Diabetes Mother   . Seizures Brother    History  Substance Use Topics  . Smoking status: Never Smoker   . Smokeless tobacco: Never Used  . Alcohol Use: No   OB History   Grav Para Term Preterm Abortions TAB SAB Ect Mult Living   5 4 4  1  1   4      Review of Systems  10 systems reviewed and found to be negative, except as noted in the HPI.   Allergies  Penicillins and Latex  Home Medications   Prior to Admission medications   Medication Sig Start Date End Date Taking? Authorizing Provider  albuterol (PROVENTIL HFA;VENTOLIN HFA) 108 (90 BASE) MCG/ACT inhaler Inhale 2 puffs into the lungs every  6 (six) hours as needed for wheezing or shortness of breath. 08/03/13  Yes Lesly Dukes, MD  Prenatal Vit-Fe Fumarate-FA (PRENATAL MULTIVITAMIN) TABS tablet Take 1 tablet by mouth daily at 12 noon.   Yes Historical Provider, MD  methocarbamol (ROBAXIN) 500 MG tablet Take 1 tablet (500 mg total) by mouth 2 (two) times daily as needed for muscle spasms. 06/05/14   Donnalynn Wheeless, PA-C   BP 126/67  Pulse 89  Temp(Src) 98.6 F (37 C) (Oral)  Resp 17  SpO2 100%  LMP 04/23/2014 Physical Exam  Nursing note and vitals reviewed. Constitutional: She is oriented to person, place, and time. She appears well-developed and well-nourished. No distress.  Obese  HENT:  Head: Normocephalic.  Eyes: Conjunctivae and EOM are normal.  Cardiovascular: Normal rate, regular rhythm and intact distal pulses.   Pulmonary/Chest: Effort normal and breath sounds normal. No stridor.  Abdominal: Soft. Bowel sounds are normal.  Musculoskeletal: Normal range of motion. She exhibits tenderness.  Right Lower extremity and splint.  No gross deformity. Dorsalis pedis 2+. Patient is diffusely tender to palpation in the knee, tib-fib and ankle area. Reports reduced sensation to the foot and toes. Cap refill is brisk  No deformity, erythema or abrasions. FROM. No effusion or crepitance. Anterior and posterior drawer show no abnormal laxity. Stable to valgus  and varus stress. Joint lines are non-tender. Neurovascularly intact. Pt ambulates with non-antalgic gait.    Neurological: She is alert and oriented to person, place, and time.  Psychiatric: She has a normal mood and affect.    ED Course  Procedures (including critical care time) Labs Review Labs Reviewed - No data to display  Imaging Review Dg Ankle Complete Right  06/05/2014   CLINICAL DATA:  Larey SeatFell on a couch, twisting leg this evening.  EXAM: RIGHT ANKLE - COMPLETE 3+ VIEW; RIGHT FOOT COMPLETE - 3+ VIEW  COMPARISON:  None.  FINDINGS: No fracture deformity  nor dislocation. The ankle mortise appears congruent and the tibiofibular syndesmosis intact. No destructive bony lesions. Ankle soft tissue swelling without subcutaneous gas radiopaque foreign bodies.  IMPRESSION: Ankle soft tissue swelling without fracture deformity or dislocation.   Electronically Signed   By: Awilda Metroourtnay  Bloomer   On: 06/05/2014 04:00   Dg Knee Complete 4 Views Right  06/05/2014   CLINICAL DATA:  Fall.  Knee pain that radiates.  Initial encounter  EXAM: RIGHT KNEE - COMPLETE 4+ VIEW  COMPARISON:  None.  FINDINGS: There is no evidence of fracture, dislocation, or definitive joint effusion. No joint narrowing.  IMPRESSION: No acute fracture.   Electronically Signed   By: Tiburcio PeaJonathan  Watts M.D.   On: 06/05/2014 03:57   Dg Foot Complete Right  06/05/2014   CLINICAL DATA:  Larey SeatFell on a couch, twisting leg this evening.  EXAM: RIGHT ANKLE - COMPLETE 3+ VIEW; RIGHT FOOT COMPLETE - 3+ VIEW  COMPARISON:  None.  FINDINGS: No fracture deformity nor dislocation. The ankle mortise appears congruent and the tibiofibular syndesmosis intact. No destructive bony lesions. Ankle soft tissue swelling without subcutaneous gas radiopaque foreign bodies.  IMPRESSION: Ankle soft tissue swelling without fracture deformity or dislocation.   Electronically Signed   By: Awilda Metroourtnay  Bloomer   On: 06/05/2014 04:00     EKG Interpretation None      MDM   Final diagnoses:  Fall at home, initial encounter  Knee sprain and strain, left, initial encounter  Ankle sprain, left, initial encounter    Filed Vitals:   06/05/14 0239 06/05/14 0300 06/05/14 0500  BP:  125/71 126/67  Pulse:  84 89  Temp:  98.8 F (37.1 C) 98.6 F (37 C)  TempSrc:  Oral Oral  Resp:  19 17  SpO2: 97% 100% 100%    Medications  morphine 4 MG/ML injection 4 mg (4 mg Intravenous Given 06/05/14 0353)  sodium chloride 0.9 % bolus 1,000 mL (0 mLs Intravenous Stopped 06/05/14 0455)  ondansetron (ZOFRAN) injection 4 mg (4 mg Intravenous  Given 06/05/14 0351)    [name] is a 28 y.o. female presenting with diffuse pain to right lower extremity distal to the knee. Patient slipped and fell at home. She been drinking prior to arrival. She is neurovascularly intact, no focal tenderness to palpation on the leg and knee. X-rays of knee ankle and foot are negative. No gross ligamentous injury on physical exam of the knee. Patient will be placed in a knee immobilizer, given crutches and pain medication. I have asked her to follow with the orthopedist Dr. Roda ShuttersXu  Evaluation does not show pathology that would require ongoing emergent intervention or inpatient treatment. Pt is hemodynamically stable and mentating appropriately. Discussed findings and plan with patient/guardian, who agrees with care plan. All questions answered. Return precautions discussed and outpatient follow up given.   Discharge Medication List as of 06/05/2014  4:41 AM  START taking these medications   Details  methocarbamol (ROBAXIN) 500 MG tablet Take 1 tablet (500 mg total) by mouth 2 (two) times daily as needed for muscle spasms., Starting 06/05/2014, Until Discontinued, [employer]             Samari Bittinger, PA-C 06/05/14 [phone]

## 2014-06-05 NOTE — ED Notes (Addendum)
Per EMS , pt. Is from home with complained of fall  with right lower leg injury, denies LOC. Pt. Was partying at home with family @ 0145 this morning,  fell and landed on a couch but "had right leg twisted forward" . Pt. Claimed of drinking beers  During the party. Splint on affected LE in placed. Alert and oriented x3. 3 doses of of IV Fentanyl given per EMS, claimed relief of pain upon arrival to ED.

## 2014-06-07 ENCOUNTER — Ambulatory Visit: Payer: Medicaid Other | Admitting: Internal Medicine

## 2014-06-21 ENCOUNTER — Encounter (HOSPITAL_COMMUNITY): Payer: Self-pay | Admitting: Emergency Medicine

## 2014-07-21 ENCOUNTER — Encounter: Payer: Self-pay | Admitting: Family Medicine

## 2014-07-22 ENCOUNTER — Encounter: Payer: Self-pay | Admitting: Obstetrics & Gynecology

## 2014-07-28 ENCOUNTER — Inpatient Hospital Stay (HOSPITAL_COMMUNITY)
Admission: AD | Admit: 2014-07-28 | Discharge: 2014-07-28 | Disposition: A | Discharge status: Home / Self Care | Payer: Medicaid Other | Source: Ambulatory Visit | Attending: Obstetrics & Gynecology | Admitting: Obstetrics & Gynecology

## 2014-07-28 DIAGNOSIS — M545 Low back pain: Secondary | ICD-10-CM

## 2014-07-28 DIAGNOSIS — Z3200 Encounter for pregnancy test, result unknown: Secondary | ICD-10-CM | POA: Diagnosis present

## 2014-07-28 DIAGNOSIS — Z3202 Encounter for pregnancy test, result negative: Secondary | ICD-10-CM | POA: Diagnosis not present

## 2014-07-28 LAB — URINALYSIS, ROUTINE W REFLEX MICROSCOPIC
Bilirubin Urine: NEGATIVE
Glucose, UA: NEGATIVE mg/dL
Hgb urine dipstick: NEGATIVE
Ketones, ur: NEGATIVE mg/dL
Leukocytes, UA: NEGATIVE
NITRITE: NEGATIVE
PH: 6 (ref 5.0–8.0)
Protein, ur: NEGATIVE mg/dL
Urobilinogen, UA: 1 mg/dL (ref 0.0–1.0)

## 2014-07-28 LAB — POCT PREGNANCY, URINE: Preg Test, Ur: NEGATIVE

## 2014-07-28 NOTE — MAU Provider Note (Signed)
Subjective:  Ms. Marisa SprinklesLatasha N Leicht is a 28 y.o. female [phone] who presents for a pregnancy test. She has had pregnancy symptoms; irregular period and back pain. She has not taken a pregnancy test at home.    Objective:  Filed Vitals:   07/28/14 1833  BP: 106/71  Pulse: 83  Temp: 98.3 F (36.8 C)  Resp: 18   GENERAL: Well-developed, well-nourished female in no acute distress.  HEENT: Normocephalic, atraumatic.   LUNGS: Effort normal SKIN: Warm, dry and without erythema PSYCH: Normal mood and affect  Results for orders placed or performed during the hospital encounter of 07/28/14 (from the past 48 hour(s))  Pregnancy, urine POC     Status: None   Collection Time: 07/28/14  6:44 PM  Result Value Ref Range   Preg Test, Ur NEGATIVE NEGATIVE    Comment:        THE SENSITIVITY OF THIS METHODOLOGY IS >24 mIU/mL     Assessment: Encounter for pregnancy test with results negative.    Plan: Discharge home in stable condition At home pregnancy test encouraged Return to MAU for emergencies only  Iona HansenJennifer Irene Rasch, NP 07/28/2014 7:23 PM

## 2014-07-28 NOTE — MAU Note (Signed)
No period since September, has occasional spotting but not a normal period.  Has not done HPT.  Lower abd & back pain for last 3 days.  Denies bleeding, has an odor but has not noticed a discharge.  Rash on neck.

## 2014-08-16 ENCOUNTER — Telehealth: Payer: Self-pay | Admitting: General Practice

## 2014-08-16 NOTE — Telephone Encounter (Signed)
-----   Message from Lesly DukesKelly H Leggett, MD sent at 08/16/2014  9:49 AM EST ----- It's an old TSH she needs.  Can you call her and see if someone is following her thyroid.   ----- Message -----    From: Kathee Deltonarrie L Merridy Pascoe, RN    Sent: 08/16/2014   9:38 AM      To: Lesly DukesKelly H Leggett, MD  Patient isn't currently pregnant. Are you thinking of someone else maybe?   ----- Message -----    From: Lesly DukesKelly H Leggett, MD    Sent: 08/16/2014   8:59 AM      To: [facility]  Where is patient getting prenatal care?  I think she needs a TSH drawn.

## 2014-08-16 NOTE — Telephone Encounter (Signed)
Per chart review, [facility]. Called patient, no answer- left message that we are trying to reach you in regards to current care you're receiving, please call us back at the clinics

## 2014-08-17 NOTE — Telephone Encounter (Signed)
Called patient and discussed need to have thyroid levels drawn for follow up. Patient states that she has [facility] and discuss hernia repair as well. Told patient to contact them to reschedule an appt and they can check her thyroid levels there and discussed importance of follow up. Patient verbalized understanding and states that her breast have been causing problems for years like with her back pain and wants to talk to someone about a breast reduction. Told patient that she could discuss that with a general surgeon who is also the person who would do her hernia repair. Patient verbalized understanding and had no other questions

## 2014-08-25 ENCOUNTER — Ambulatory Visit: Payer: Medicaid Other | Admitting: Family Medicine

## 2014-10-19 ENCOUNTER — Ambulatory Visit (INDEPENDENT_AMBULATORY_CARE_PROVIDER_SITE_OTHER): Payer: Self-pay | Admitting: General Surgery

## 2014-10-19 NOTE — H&P (Signed)
History of Present Illness Axel Filler(Joanathan Affeldt MD; 10/19/2014 2:31 PM) Patient words: hernia.  The patient is a 29 year old female who presents with an umbilical hernia. The patient is a 29 year old female who [facility] an [facility] her[facility]. The patient states that this hernia was there after her last child was born in April 2015. Patient states that she has continued abdominal pain the umbilicus. She does not was a bulge in the area. Patient's had no signs or symptoms of incarceration or strangulation.   Other Problems Fay Records(Ashley Beck, CMA; 10/19/2014 2:08 PM) Back Pain Inguinal Hernia Migraine Headache  Past Surgical History Fay Records(Ashley Beck, CMA; 10/19/2014 2:08 PM) No pertinent past surgical history  Diagnostic Studies History Fay Records(Ashley Beck, CMA; 10/19/2014 2:08 PM) Colonoscopy never Mammogram never Pap Smear 1-5 years ago  Allergies Fay Records(Ashley Beck, CMA; 10/19/2014 2:10 PM) Penicillin G Procaine *PENICILLINS* Latex Itching.  Medication History Fay Records(Ashley Beck, [place]; 10/19/2014 2:10 PM) No Current Medications Medications Reconciled  Social History Fay Records(Ashley Beck, [place]; 10/19/2014 2:09 PM) Caffeine use Carbonated beverages, Coffee, Tea. No alcohol use No drug use Tobacco use Never smoker.  Family History Fay Records(Ashley Beck, [place]; 10/19/2014 2:08 PM) Alcohol Abuse Family Members In General. Diabetes Mellitus Family Members In General, Father, Mother. Heart Disease Father. Heart disease in female family member before age 31 Hypertension Family Members In General, Father. Kidney Disease Family Members In General, Father. Prostate Cancer Family Members In General. Respiratory Condition Mother. Seizure disorder Brother, Family Members In General.  Pregnancy / Birth History Fay Records(Ashley Beck, CMA; 10/19/2014 2:09 PM) Age at menarche 10 years. Gravida 5 Irregular periods Maternal age 31-25 Para 4  Review of Systems Fay Records(Ashley Beck CMA; 10/19/2014 2:09  PM) General Present- Fever, Night Sweats and Weight Gain. Not Present- Appetite Loss, Chills, Fatigue and Weight Loss. Skin Not Present- Change in Wart/Mole, Dryness, Hives, Jaundice, New Lesions, Non-Healing Wounds, Rash and Ulcer. HEENT Present- Seasonal Allergies. Not Present- Earache, Hearing Loss, Hoarseness, Nose Bleed, Oral Ulcers, Ringing in the Ears, Sinus Pain, Sore Throat, Visual Disturbances, Wears glasses/contact lenses and Yellow Eyes. Respiratory Not Present- Bloody sputum, Chronic Cough, Difficulty Breathing, Snoring and Wheezing. Breast Not Present- Breast Mass, Breast Pain, Nipple Discharge and Skin Changes. Cardiovascular Present- Leg Cramps and Swelling of Extremities. Not Present- Chest Pain, Difficulty Breathing Lying Down, Palpitations, Rapid Heart Rate and Shortness of Breath. Gastrointestinal Present- Abdominal Pain, Nausea and Vomiting. Not Present- Bloating, Bloody Stool, Change in Bowel Habits, Chronic diarrhea, Constipation, Difficulty Swallowing, Excessive gas, Gets full quickly at meals, Hemorrhoids, Indigestion and Rectal Pain. Female Genitourinary Not Present- Frequency, Nocturia, Painful Urination, Pelvic Pain and Urgency. Musculoskeletal Present- Back Pain. Not Present- Joint Pain, Joint Stiffness, Muscle Pain, Muscle Weakness and Swelling of Extremities. Neurological Present- Fainting, Headaches and Weakness. Not Present- Decreased Memory, Numbness, Seizures, Tingling, Tremor and Trouble walking. Psychiatric Not Present- Anxiety, Bipolar, Change in Sleep Pattern, Depression, Fearful and Frequent crying. Endocrine Present- Hot flashes. Not Present- Cold Intolerance, Excessive Hunger, Hair Changes, Heat Intolerance and New Diabetes. Hematology Not Present- Easy Bruising, Excessive bleeding, Gland problems, HIV and Persistent Infections.   Vitals Fay Records(Ashley Beck CMA; 10/19/2014 2:11 PM) 10/19/2014 2:10 PM Weight: 311 lb Height: 66in Body Surface Area: 2.56 m Body  Mass Index: 50.2 kg/m Temp.: 96.37F(Temporal)  Pulse: 75 (Regular)  BP: 122/78 (Sitting, Left Arm, Standard)    Physical Exam Axel Filler(Shalik Sanfilippo MD; 10/19/2014 2:31 PM) General Mental Status-Alert. General Appearance-Consistent with stated age. Hydration-Well hydrated. Voice-Normal.  Head and Neck Head-normocephalic, atraumatic with  no lesions or palpable masses. Trachea-midline. Thyroid Gland Characteristics - normal size and consistency.  Chest and Lung Exam Chest and lung exam reveals -quiet, even and easy respiratory effort with no use of accessory muscles and on auscultation, normal breath sounds, no adventitious sounds and normal vocal resonance. Inspection Chest Wall - Normal. Back - normal.  Cardiovascular Cardiovascular examination reveals -normal heart sounds, regular rate and rhythm with no murmurs and normal pedal pulses bilaterally.  Abdomen Inspection Skin - Scar - no surgical scars. Hernias - Umbilical hernia - Reducible(3-4 cm umbilical hernia). Palpation/Percussion Normal exam - Soft, Non Tender, No Rebound tenderness, No Rigidity (guarding) and No hepatosplenomegaly. Auscultation Normal exam - Bowel sounds normal.    Assessment & Plan Axel Filler MD; 10/19/2014 2:32 PM) UMBILICAL HERNIA WITHOUT OBSTRUCTION AND WITHOUT GANGRENE (553.1  K42.9) Impression: 29 year old female with umbilical hernia The patient is to undergo pregnancy test tomorrow. If this is positive we will not schedule surgery. I discussed this with the patient in detail. If the test is negative we'll proceed with laparoscopic umbilical hernia repair with mesh. All risks and benefits were discussed with the patient to generally include, but not limited to: infection, bleeding, damage to surrounding structures, acute and chronic nerve pain, and recurrence. Alternatives were offered and described. All questions were answered and the patient voiced understanding of the  procedure and wishes to proceed at this point with hernia repair.

## 2014-11-09 ENCOUNTER — Telehealth: Payer: Self-pay | Admitting: General Practice

## 2014-11-09 NOTE — Telephone Encounter (Signed)
Patient called into front office stating she was told she needed to follow up on her hormone levels. Told patient that looking in her chart I see where we discussed in December that she needed to have her thyroid levels checked and asked if she had found a PCP yet for that. Patient states no and that she had tried to get in with [facility] but they gave her the run around. Recommended to patient she contact them for appt and to call at 930am each morning until she gets a new patient appt and provided patient with their phone number. Patient verbalized understanding and had no other questions

## 2014-11-11 ENCOUNTER — Encounter (HOSPITAL_COMMUNITY): Payer: Self-pay

## 2014-11-11 ENCOUNTER — Encounter (HOSPITAL_COMMUNITY)
Admission: RE | Admit: 2014-11-11 | Discharge: 2014-11-11 | Disposition: A | Discharge status: Home / Self Care | Payer: Medicaid Other | Source: Ambulatory Visit | Attending: General Surgery | Admitting: General Surgery

## 2014-11-11 DIAGNOSIS — K429 Umbilical hernia without obstruction or gangrene: Secondary | ICD-10-CM | POA: Diagnosis not present

## 2014-11-11 DIAGNOSIS — E119 Type 2 diabetes mellitus without complications: Secondary | ICD-10-CM | POA: Diagnosis not present

## 2014-11-11 DIAGNOSIS — Z6841 Body Mass Index (BMI) 40.0 and over, adult: Secondary | ICD-10-CM | POA: Diagnosis not present

## 2014-11-11 DIAGNOSIS — G43909 Migraine, unspecified, not intractable, without status migrainosus: Secondary | ICD-10-CM | POA: Diagnosis not present

## 2014-11-11 DIAGNOSIS — J45909 Unspecified asthma, uncomplicated: Secondary | ICD-10-CM | POA: Diagnosis not present

## 2014-11-11 LAB — BASIC METABOLIC PANEL
Anion gap: 10 (ref 5–15)
BUN: 11 mg/dL (ref 6–23)
CHLORIDE: 103 mmol/L (ref 96–112)
CO2: 24 mmol/L (ref 19–32)
CREATININE: 0.58 mg/dL (ref 0.50–1.10)
Calcium: 9.2 mg/dL (ref 8.4–10.5)
GFR calc non Af Amer: >90 mL/min (ref >90)
Glucose, Bld: 157 mg/dL — ABNORMAL HIGH (ref 70–99)
Potassium: 3.5 mmol/L (ref 3.5–5.1)
Sodium: 137 mmol/L (ref 135–145)

## 2014-11-11 LAB — CBC
HCT: 41.2 % (ref 36.0–46.0)
Hemoglobin: 13.6 g/dL (ref 12.0–15.0)
MCH: 26.9 pg (ref 26.0–34.0)
MCHC: 33 g/dL (ref 30.0–36.0)
MCV: 81.6 fL (ref 78.0–100.0)
PLATELETS: 140 10*3/uL — AB (ref 150–400)
RBC: 5.05 MIL/uL (ref 3.87–5.11)
RDW: 15.8 % — AB (ref 11.5–15.5)
WBC: 5.7 10*3/uL (ref 4.0–10.5)

## 2014-11-11 LAB — HCG, SERUM, QUALITATIVE: PREG SERUM: NEGATIVE

## 2014-11-11 NOTE — Pre-Procedure Instructions (Signed)
Marisa SprinklesLatasha N Stolarz  11/11/2014   Your procedure is scheduled on:  11/22/14  [facility] at 11 AM.  Call this number if you have problems the morning of surgery: [phone]   Remember:   Do not eat food or drink liquids after midnight.   Take these medicines the morning of surgery with A SIP OF WATER: all inhalers   Do not wear jewelry, make-up or nail polish.  Do not wear lotions, powders, or perfumes. You may wear deodorant.  Do not shave 48 hours prior to surgery. Men may shave face and neck.  Do not bring valuables to the hospital.  [facility] is not responsible                  for any belongings or valuables.               Contacts, dentures or bridgework may not be worn into surgery.  Leave suitcase in the car. After surgery it may be brought to your room.  For patients admitted to the hospital, discharge time is determined by your                treatment team.               Patients discharged the day of surgery will not be allowed to drive  home.  Name and phone number of your driver:  Special Instructions: Shower using CHG 2 nights before surgery and the night before surgery.  If you shower the day of surgery use CHG.  Use special wash - you have one bottle of CHG for all showers.  You should use approximately 1/3 of the bottle for each shower.   Please read over the following fact sheets that you were given: Pain Booklet, Coughing and Deep Breathing and Surgical Site Infection Prevention

## 2014-11-21 MED ORDER — SODIUM CHLORIDE 0.9 % IV SOLN
1500.0000 mg | INTRAVENOUS | Status: AC
  Administered 2014-11-22: 1500 mg via INTRAVENOUS
  Filled 2014-11-21: qty 1500

## 2014-11-22 ENCOUNTER — Encounter (HOSPITAL_COMMUNITY)
Admission: RE | Disposition: A | Discharge status: Home / Self Care | Payer: Self-pay | Source: Ambulatory Visit | Attending: General Surgery

## 2014-11-22 ENCOUNTER — Encounter (HOSPITAL_COMMUNITY): Payer: Self-pay | Admitting: *Deleted

## 2014-11-22 ENCOUNTER — Ambulatory Visit (HOSPITAL_COMMUNITY): Payer: Medicaid Other | Admitting: Certified Registered Nurse Anesthetist

## 2014-11-22 ENCOUNTER — Ambulatory Visit (HOSPITAL_COMMUNITY)
Admission: RE | Admit: 2014-11-22 | Discharge: 2014-11-22 | Disposition: A | Discharge status: Home / Self Care | Payer: Medicaid Other | Source: Ambulatory Visit | Attending: General Surgery | Admitting: General Surgery

## 2014-11-22 DIAGNOSIS — G43909 Migraine, unspecified, not intractable, without status migrainosus: Secondary | ICD-10-CM | POA: Diagnosis not present

## 2014-11-22 DIAGNOSIS — Z6841 Body Mass Index (BMI) 40.0 and over, adult: Secondary | ICD-10-CM | POA: Insufficient documentation

## 2014-11-22 DIAGNOSIS — E119 Type 2 diabetes mellitus without complications: Secondary | ICD-10-CM | POA: Diagnosis not present

## 2014-11-22 DIAGNOSIS — K429 Umbilical hernia without obstruction or gangrene: Secondary | ICD-10-CM | POA: Diagnosis not present

## 2014-11-22 DIAGNOSIS — J45909 Unspecified asthma, uncomplicated: Secondary | ICD-10-CM | POA: Diagnosis not present

## 2014-11-22 HISTORY — PX: UMBILICAL HERNIA REPAIR: SHX196

## 2014-11-22 SURGERY — REPAIR, HERNIA, UMBILICAL, LAPAROSCOPIC
Anesthesia: General | Site: Abdomen

## 2014-11-22 MED ORDER — HYDROMORPHONE HCL 1 MG/ML IJ SOLN
INTRAMUSCULAR | Status: AC
  Administered 2014-11-22: 0.5 mg via INTRAVENOUS
  Filled 2014-11-22: qty 1

## 2014-11-22 MED ORDER — BUPIVACAINE HCL (PF) 0.25 % IJ SOLN
INTRAMUSCULAR | Status: AC
  Filled 2014-11-22: qty 30

## 2014-11-22 MED ORDER — HYDROMORPHONE HCL 1 MG/ML IJ SOLN
0.2500 mg | INTRAMUSCULAR | Status: DC | PRN
  Administered 2014-11-22 (×4): 0.5 mg via INTRAVENOUS

## 2014-11-22 MED ORDER — OXYCODONE HCL 5 MG/5ML PO SOLN: 5.0000 mg | Freq: Once | ORAL | Status: AC | PRN

## 2014-11-22 MED ORDER — ROCURONIUM BROMIDE 100 MG/10ML IV SOLN
INTRAVENOUS | Status: DC | PRN
  Administered 2014-11-22: 20 mg via INTRAVENOUS
  Administered 2014-11-22: 30 mg via INTRAVENOUS

## 2014-11-22 MED ORDER — OXYCODONE HCL 5 MG PO TABS
5.0000 mg | ORAL_TABLET | Freq: Once | ORAL | Status: AC | PRN
  Administered 2014-11-22: 5 mg via ORAL

## 2014-11-22 MED ORDER — MIDAZOLAM HCL 5 MG/5ML IJ SOLN
INTRAMUSCULAR | Status: DC | PRN
  Administered 2014-11-22: 2 mg via INTRAVENOUS

## 2014-11-22 MED ORDER — GLYCOPYRROLATE 0.2 MG/ML IJ SOLN
INTRAMUSCULAR | Status: AC
  Filled 2014-11-22: qty 2

## 2014-11-22 MED ORDER — ALBUTEROL SULFATE HFA 108 (90 BASE) MCG/ACT IN AERS
INHALATION_SPRAY | RESPIRATORY_TRACT | Status: AC
  Filled 2014-11-22: qty 13.4

## 2014-11-22 MED ORDER — LIDOCAINE HCL (CARDIAC) 20 MG/ML IV SOLN
INTRAVENOUS | Status: DC | PRN
  Administered 2014-11-22: 100 mg via INTRAVENOUS

## 2014-11-22 MED ORDER — DEXAMETHASONE SODIUM PHOSPHATE 4 MG/ML IJ SOLN
INTRAMUSCULAR | Status: DC | PRN
  Administered 2014-11-22: 4 mg via INTRAVENOUS

## 2014-11-22 MED ORDER — BUPIVACAINE HCL 0.25 % IJ SOLN
INTRAMUSCULAR | Status: DC | PRN
  Administered 2014-11-22: 12 mL

## 2014-11-22 MED ORDER — SUCCINYLCHOLINE CHLORIDE 20 MG/ML IJ SOLN
INTRAMUSCULAR | Status: DC | PRN
  Administered 2014-11-22: 80 mg via INTRAVENOUS

## 2014-11-22 MED ORDER — ONDANSETRON HCL 4 MG/2ML IJ SOLN
INTRAMUSCULAR | Status: DC | PRN
  Administered 2014-11-22 (×2): 4 mg via INTRAVENOUS

## 2014-11-22 MED ORDER — NEOSTIGMINE METHYLSULFATE 10 MG/10ML IV SOLN
INTRAVENOUS | Status: DC | PRN
  Administered 2014-11-22: 1 mg via INTRAVENOUS
  Administered 2014-11-22: 3 mg via INTRAVENOUS

## 2014-11-22 MED ORDER — GLYCOPYRROLATE 0.2 MG/ML IJ SOLN
INTRAMUSCULAR | Status: DC | PRN
Start: 2014-11-22 — End: 2014-11-22
  Administered 2014-11-22: .2 mg via INTRAVENOUS
  Administered 2014-11-22: .4 mg via INTRAVENOUS

## 2014-11-22 MED ORDER — LACTATED RINGERS IV SOLN
INTRAVENOUS | Status: DC
  Administered 2014-11-22: 12:00:00 via INTRAVENOUS

## 2014-11-22 MED ORDER — OXYCODONE HCL 5 MG PO TABS
5.0000 mg | ORAL_TABLET | ORAL | Status: DC | PRN
  Administered 2014-11-22: 5 mg via ORAL
  Filled 2014-11-22: qty 2

## 2014-11-22 MED ORDER — OXYCODONE-ACETAMINOPHEN 5-325 MG PO TABS: 1.0000 | ORAL_TABLET | ORAL | Status: DC | PRN

## 2014-11-22 MED ORDER — SODIUM CHLORIDE 0.9 % IR SOLN
Status: DC | PRN
  Administered 2014-11-22: 1000 mL

## 2014-11-22 MED ORDER — PROPOFOL 10 MG/ML IV BOLUS
INTRAVENOUS | Status: DC | PRN
  Administered 2014-11-22: 160 mg via INTRAVENOUS
  Administered 2014-11-22: 20 mg via INTRAVENOUS
  Administered 2014-11-22: 10 mg via INTRAVENOUS

## 2014-11-22 MED ORDER — FENTANYL CITRATE 0.05 MG/ML IJ SOLN
INTRAMUSCULAR | Status: DC | PRN
  Administered 2014-11-22 (×2): 50 ug via INTRAVENOUS

## 2014-11-22 MED ORDER — FENTANYL CITRATE 0.05 MG/ML IJ SOLN
INTRAMUSCULAR | Status: AC
  Filled 2014-11-22: qty 5

## 2014-11-22 MED ORDER — OXYCODONE HCL 5 MG PO TABS
ORAL_TABLET | ORAL | Status: AC
  Filled 2014-11-22: qty 2

## 2014-11-22 MED ORDER — CHLORHEXIDINE GLUCONATE 4 % EX LIQD: 1.0000 "application " | Freq: Once | CUTANEOUS | Status: DC

## 2014-11-22 MED ORDER — MIDAZOLAM HCL 2 MG/2ML IJ SOLN
INTRAMUSCULAR | Status: AC
  Filled 2014-11-22: qty 2

## 2014-11-22 MED ORDER — DEXTROSE 5 % IV SOLN
10.0000 mg | INTRAVENOUS | Status: DC | PRN
  Administered 2014-11-22: 15 ug/min via INTRAVENOUS

## 2014-11-22 MED ORDER — PROPOFOL 10 MG/ML IV BOLUS
INTRAVENOUS | Status: AC
  Filled 2014-11-22: qty 20

## 2014-11-22 SURGICAL SUPPLY — 49 items
APPLIER CLIP LOGIC TI 5 (MISCELLANEOUS) IMPLANT
APPLIER CLIP ROT 10 11.4 M/L (STAPLE)
BENZOIN TINCTURE PRP APPL 2/3 (GAUZE/BANDAGES/DRESSINGS) ×3 IMPLANT
BLADE SURG 11 STRL SS (BLADE) ×3 IMPLANT
BLADE SURG ROTATE 9660 (MISCELLANEOUS) IMPLANT
CANISTER SUCTION 2500CC (MISCELLANEOUS) IMPLANT
CHLORAPREP W/TINT 26ML (MISCELLANEOUS) ×3 IMPLANT
CLIP APPLIE ROT 10 11.4 M/L (STAPLE) IMPLANT
CLOSURE WOUND 1/2 X4 (GAUZE/BANDAGES/DRESSINGS) ×1
COVER SURGICAL LIGHT HANDLE (MISCELLANEOUS) ×3 IMPLANT
DEVICE SECURE STRAP 25 ABSORB (INSTRUMENTS) IMPLANT
DEVICE TROCAR PUNCTURE CLOSURE (ENDOMECHANICALS) ×3 IMPLANT
DRAPE LAPAROSCOPIC ABDOMINAL (DRAPES) ×3 IMPLANT
ELECT REM PT RETURN 9FT ADLT (ELECTROSURGICAL) ×3
ELECTRODE REM PT RTRN 9FT ADLT (ELECTROSURGICAL) ×1 IMPLANT
GAUZE SPONGE 2X2 8PLY STRL LF (GAUZE/BANDAGES/DRESSINGS) ×1 IMPLANT
GAUZE SPONGE 4X4 12PLY STRL (GAUZE/BANDAGES/DRESSINGS) IMPLANT
GLOVE BIO SURGEON STRL SZ7.5 (GLOVE) IMPLANT
GLOVE SURG SS PI 7.0 STRL IVOR (GLOVE) ×6 IMPLANT
GLOVE SURG SS PI 7.5 STRL IVOR (GLOVE) ×3 IMPLANT
GOWN STRL REUS W/ TWL LRG LVL3 (GOWN DISPOSABLE) ×2 IMPLANT
GOWN STRL REUS W/ TWL XL LVL3 (GOWN DISPOSABLE) ×1 IMPLANT
GOWN STRL REUS W/TWL LRG LVL3 (GOWN DISPOSABLE) ×4
GOWN STRL REUS W/TWL XL LVL3 (GOWN DISPOSABLE) ×2
KIT BASIN OR (CUSTOM PROCEDURE TRAY) ×3 IMPLANT
KIT ROOM TURNOVER OR (KITS) ×3 IMPLANT
MARKER SKIN DUAL TIP RULER LAB (MISCELLANEOUS) ×3 IMPLANT
MESH VENTRALIGHT ST 4.5IN (Mesh General) ×3 IMPLANT
NEEDLE INSUFFLATION 14GA 120MM (NEEDLE) ×3 IMPLANT
NEEDLE SPNL 22GX3.5 QUINCKE BK (NEEDLE) ×3 IMPLANT
NS IRRIG 1000ML POUR BTL (IV SOLUTION) ×3 IMPLANT
PAD ARMBOARD 7.5X6 YLW CONV (MISCELLANEOUS) ×6 IMPLANT
SCISSORS LAP 5X35 DISP (ENDOMECHANICALS) ×3 IMPLANT
SET IRRIG TUBING LAPAROSCOPIC (IRRIGATION / IRRIGATOR) IMPLANT
SLEEVE ENDOPATH XCEL 5M (ENDOMECHANICALS) ×6 IMPLANT
SPONGE GAUZE 2X2 STER 10/PKG (GAUZE/BANDAGES/DRESSINGS) ×2
STRIP CLOSURE SKIN 1/2X4 (GAUZE/BANDAGES/DRESSINGS) ×2 IMPLANT
SUT CHROMIC 2 0 SH (SUTURE) ×3 IMPLANT
SUT MNCRL AB 4-0 PS2 18 (SUTURE) ×3 IMPLANT
SUT PROLENE 2 0 KS (SUTURE) ×6 IMPLANT
TAPE CLOTH SURG 4X10 WHT LF (GAUZE/BANDAGES/DRESSINGS) ×3 IMPLANT
TOWEL OR 17X24 6PK STRL BLUE (TOWEL DISPOSABLE) ×3 IMPLANT
TOWEL OR 17X26 10 PK STRL BLUE (TOWEL DISPOSABLE) ×3 IMPLANT
TRAY FOLEY CATH 14FR (SET/KITS/TRAYS/PACK) IMPLANT
TRAY LAPAROSCOPIC (CUSTOM PROCEDURE TRAY) ×3 IMPLANT
TROCAR XCEL BLUNT TIP 100MML (ENDOMECHANICALS) IMPLANT
TROCAR XCEL NON-BLD 11X100MML (ENDOMECHANICALS) IMPLANT
TROCAR XCEL NON-BLD 5MMX100MML (ENDOMECHANICALS) ×3 IMPLANT
TUBING INSUFFLATION (TUBING) ×3 IMPLANT

## 2014-11-22 NOTE — Transfer of Care (Signed)
Immediate Anesthesia Transfer of Care Note  Patient: Monique Schmidt  Procedure(s) Performed: Procedure(s): LAPAROSCOPIC UMBILICAL HERNIA (N/A)  Patient Location: PACU  Anesthesia Type:General  Level of Consciousness: awake, alert  and oriented  Airway & Oxygen Therapy: Patient Spontanous Breathing and Patient connected to nasal cannula oxygen  Post-op Assessment: Report given to RN and Post -op Vital signs reviewed and stable  Post vital signs: Reviewed and stable  Last Vitals:  Filed Vitals:   11/22/14 1130  BP: 154/81  Pulse: 75  Temp: 36.7 C  Resp: 18    Complications: No apparent anesthesia complications   Denies pain

## 2014-11-22 NOTE — Op Note (Signed)
11/22/2014  2:14 PM  PATIENT:  Monique Schmidt  29 y.o. female  PRE-OPERATIVE DIAGNOSIS:  umbilical hernia  POST-OPERATIVE DIAGNOSIS:  umbilical hernia  PROCEDURE:  Procedure(s): LAPAROSCOPIC UMBILICAL HERNIA (N/A)  SURGEON:  Surgeon(s) and Role:    * Axel FillerArmando Nyree Applegate, MD - Primary  ANESTHESIA:   local and general  EBL:   <5cc  BLOOD ADMINISTERED:none  DRAINS: none   LOCAL MEDICATIONS USED:  BUPIVICAINE   SPECIMEN:  No Specimen  DISPOSITION OF SPECIMEN:  N/A  COUNTS:  YES  TOURNIQUET:  * No tourniquets in log *  DICTATION: .Dragon Dictation Details of the procedure:   After the patient was consented patient was taken back to the operating room patient was then placed in supine position bilateral SCDs in place.  The patient was prepped and draped in the usual sterile fashion. After antibiotics were confirmed a timeout was called and all facts were verified. The Veress needle technique was used to insuflate the abdomen at Palmer's point. The abdomen was insufflated to 14 mm mercury. Subsequently a 5 mm trocar was placed a camera inserted there was no injury to any intra-abdominal organs.  There was seen to be an umbilical  Hernia 1cm.  A second camera port was in placed into the left lower quadrant.   At this the Falicform ligament was taken down  with Bovie cautery maintaining hemostasis. A 5mm port was placed in the epigastrium . I proceeded to reduce the hernia contents.  Once the hernia was cleared away, a Bard Ventralight 11.4cm  mesh was inserted into the abdomen.  The mesh was secured circumferentially with am Securestrap tacker in a double crown fashion. The falciform ligament was then brought up to the abdominal wall and secured with the Securestrap.  Transfascial sutures were placed at the 12:00, 3:00, 6:00, 9:00 positition with 2-0 prolenes. The omentum was brought over the area of the mesh. The pneumoperitoneum was evacuated  & all trocars  were removed. The skin was  reapproximated with 4-0  Monocryl sutures in a subcuticular fashion. The skin was dressed with Steri-Strips tape and gauze.  The patient was taken to the recovery room in stable condition.    PLAN OF CARE: Discharge to home after PACU  PATIENT DISPOSITION:  PACU - hemodynamically stable.   Delay start of Pharmacological VTE agent (>24hrs) due to surgical blood loss or risk of bleeding: not applicable

## 2014-11-22 NOTE — H&P (Signed)
History of Present Illness Axel Filler(Askari Kinley MD; 10/19/2014 2:31 PM) Patient words: hernia.  The patient is a 29 year old female who presents with an umbilical hernia. The patient is a 29 year old female who is referred by [facility] for evaluation of an umbilical hernia. The patient states that this hernia was there after her last child was born in April 2015. Patient states that she has continued abdominal pain the umbilicus. She does not was a bulge in the area. Patient's had no signs or symptoms of incarceration or strangulation.   Other Problems Fay Records(Ashley Beck, CMA; 10/19/2014 2:08 PM) Back Pain Inguinal Hernia Migraine Headache  Past Surgical History Fay Records(Ashley Beck, CMA; 10/19/2014 2:08 PM) No pertinent past surgical history  Diagnostic Studies History Fay Records(Ashley Beck, CMA; 10/19/2014 2:08 PM) Colonoscopy never Mammogram never Pap Smear 1-5 years ago  Allergies Fay Records(Ashley Beck, CMA; 10/19/2014 2:10 PM) Penicillin G Procaine *PENICILLINS* Latex Itching.  Medication History Fay Records([place]; 10/19/2014 2:10 PM) No Current Medications Medications Reconciled  Social History Fay Records(Ashley Beck[place]; 10/19/2014 2:09 PM) Caffeine use Carbonated beverages, Coffee, Tea. No alcohol use No drug use Tobacco use Never smoker.  Family History Fay Records(Ashley Beck, [place]; 10/19/2014 2:08 PM) Alcohol Abuse Family Members In General. Diabetes Mellitus Family Members In General, Father, Mother. Heart Disease Father. Heart disease in female family member before age 31 Hypertension Family Members In General, Father. Kidney Disease Family Members In General, Father. Prostate Cancer Family Members In General. Respiratory Condition Mother. Seizure disorder Brother, Family Members In General.  Pregnancy / Birth History Fay Records(Ashley Beck, CMA; 10/19/2014 2:09 PM) Age at menarche 10 years. Gravida 5 Irregular periods Maternal age 31-25 Para 4  Review of Systems Fay Records(Ashley Beck CMA; 10/19/2014 2:09  PM) General Present- Fever, Night Sweats and Weight Gain. Not Present- Appetite Loss, Chills, Fatigue and Weight Loss. Skin Not Present- Change in Wart/Mole, Dryness, Hives, Jaundice, New Lesions, Non-Healing Wounds, Rash and Ulcer. HEENT Present- Seasonal Allergies. Not Present- Earache, Hearing Loss, Hoarseness, Nose Bleed, Oral Ulcers, Ringing in the Ears, Sinus Pain, Sore Throat, Visual Disturbances, Wears glasses/contact lenses and Yellow Eyes. Respiratory Not Present- Bloody sputum, Chronic Cough, Difficulty Breathing, Snoring and Wheezing. Breast Not Present- Breast Mass, Breast Pain, Nipple Discharge and Skin Changes. Cardiovascular Present- Leg Cramps and Swelling of Extremities. Not Present- Chest Pain, Difficulty Breathing Lying Down, Palpitations, Rapid Heart Rate and Shortness of Breath. Gastrointestinal Present- Abdominal Pain, Nausea and Vomiting. Not Present- Bloating, Bloody Stool, Change in Bowel Habits, Chronic diarrhea, Constipation, Difficulty Swallowing, Excessive gas, Gets full quickly at meals, Hemorrhoids, Indigestion and Rectal Pain. Female Genitourinary Not Present- Frequency, Nocturia, Painful Urination, Pelvic Pain and Urgency. Musculoskeletal Present- Back Pain. Not Present- Joint Pain, Joint Stiffness, Muscle Pain, Muscle Weakness and Swelling of Extremities. Neurological Present- Fainting, Headaches and Weakness. Not Present- Decreased Memory, Numbness, Seizures, Tingling, Tremor and Trouble walking. Psychiatric Not Present- Anxiety, Bipolar, Change in Sleep Pattern, Depression, Fearful and Frequent crying. Endocrine Present- Hot flashes. Not Present- Cold Intolerance, Excessive Hunger, Hair Changes, Heat Intolerance and New Diabetes. Hematology Not Present- Easy Bruising, Excessive bleeding, Gland problems, HIV and Persistent Infections.   Vitals Fay Records(Ashley Beck CMA; 10/19/2014 2:11 PM) 10/19/2014 2:10 PM Weight: 311 lb Height: 66in Body Surface Area: 2.56 m Body  Mass Index: 50.2 kg/m Temp.: 96.57F(Temporal)  Pulse: 75 (Regular)  BP: 122/78 (Sitting, Left Arm, Standard)    Physical Exam Axel Filler(Tyreisha Ungar MD; 10/19/2014 2:31 PM) General Mental Status-Alert. General Appearance-Consistent with stated age. Hydration-Well hydrated. Voice-Normal.  Head and Neck Head-normocephalic, atraumatic with  no lesions or palpable masses. Trachea-midline. Thyroid Gland Characteristics - normal size and consistency.  Chest and Lung Exam Chest and lung exam reveals -quiet, even and easy respiratory effort with no use of accessory muscles and on auscultation, normal breath sounds, no adventitious sounds and normal vocal resonance. Inspection Chest Wall - Normal. Back - normal.  Cardiovascular Cardiovascular examination reveals -normal heart sounds, regular rate and rhythm with no murmurs and normal pedal pulses bilaterally.  Abdomen Inspection Skin - Scar - no surgical scars. Hernias - Umbilical hernia - Reducible(3-4 cm umbilical hernia). Palpation/Percussion Normal exam - Soft, Non Tender, No Rebound tenderness, No Rigidity (guarding) and No hepatosplenomegaly. Auscultation Normal exam - Bowel sounds normal.    Assessment & Plan Axel Filler MD; 10/19/2014 2:32 PM) UMBILICAL HERNIA WITHOUT OBSTRUCTION AND WITHOUT GANGRENE (553.1  K42.9) Impression: 30 year old female with umbilical hernia We'll proceed with laparoscopic umbilical hernia repair with mesh.  All risks and benefits were discussed with the patient to generally include, but not limited to: infection, bleeding, damage to surrounding structures, acute and chronic nerve pain, and recurrence. Alternatives were offered and described. All questions were answered and the patient voiced understanding of the procedure and wishes to proceed at this point with hernia repair.

## 2014-11-22 NOTE — Anesthesia Preprocedure Evaluation (Signed)
Anesthesia Evaluation  Patient identified by MRN, date of birth, ID band Patient awake    Reviewed: Allergy & Precautions, NPO status , Patient's Chart, lab work & pertinent test results  History of Anesthesia Complications Negative for: history of anesthetic complications  Airway Mallampati: III  TM Distance: >3 FB Neck ROM: Full    Dental  (+) Teeth Intact   Pulmonary asthma ,  breath sounds clear to auscultation        Cardiovascular negative cardio ROS  Rhythm:Regular     Neuro/Psych  Headaches, negative psych ROS   GI/Hepatic negative GI ROS, Neg liver ROS,   Endo/Other  diabetesMorbid obesity  Renal/GU negative Renal ROS     Musculoskeletal   Abdominal (+) + obese,   Peds  Hematology negative hematology ROS (+)   Anesthesia Other Findings   Reproductive/Obstetrics                             Anesthesia Physical Anesthesia Plan  ASA: II  Anesthesia Plan: General   Post-op Pain Management:    Induction: Intravenous  Airway Management Planned: Oral ETT  Additional Equipment: None  Intra-op Plan:   Post-operative Plan: Extubation in OR  Informed Consent: I have reviewed the patients History and Physical, chart, labs and discussed the procedure including the risks, benefits and alternatives for the proposed anesthesia with the patient or authorized representative who has indicated his/her understanding and acceptance.   Dental advisory given  Plan Discussed with: CRNA and Surgeon  Anesthesia Plan Comments:         Anesthesia Quick Evaluation

## 2014-11-22 NOTE — Anesthesia Procedure Notes (Signed)
Procedure Name: Intubation Date/Time: 11/22/2014 1:50 PM Performed by: Daiva EvesAVENEL, Saleha Kalp W Pre-anesthesia Checklist: Patient identified, Timeout performed, Suction available, Patient being monitored and Emergency Drugs available Patient Re-evaluated:Patient Re-evaluated prior to inductionOxygen Delivery Method: Circle system utilized Preoxygenation: Pre-oxygenation with 100% oxygen Intubation Type: IV induction Ventilation: Mask ventilation without difficulty Laryngoscope Size: Mac and 4 Grade View: Grade I Tube type: Oral Tube size: 7.5 mm Number of attempts: 1 Airway Equipment and Method: Stylet Placement Confirmation: ETT inserted through vocal cords under direct vision,  breath sounds checked- equal and bilateral,  positive ETCO2 and CO2 detector Secured at: 22 cm Tube secured with: Tape Dental Injury: Teeth and Oropharynx as per pre-operative assessment

## 2014-11-22 NOTE — Anesthesia Postprocedure Evaluation (Signed)
  Anesthesia Post-op Note  Patient: Monique SprinklesLatasha N Fewell  Procedure(s) Performed: Procedure(s) (LRB): LAPAROSCOPIC UMBILICAL HERNIA (N/A)  Patient Location: PACU  Anesthesia Type: General  Level of Consciousness: awake and alert   Airway and Oxygen Therapy: Patient Spontanous Breathing  Post-op Pain: mild  Post-op Assessment: Post-op Vital signs reviewed, Patient's Cardiovascular Status Stable, Respiratory Function Stable, Patent Airway and No signs of Nausea or vomiting  Last Vitals:  Filed Vitals:   11/22/14 1645  BP:   Pulse: 78  Temp:   Resp:     Post-op Vital Signs: stable   Complications: No apparent anesthesia complications

## 2014-11-22 NOTE — Discharge Instructions (Signed)
[facility] _______[facility] ° °UMBILICAL HERNIA REPAIR: POST OP INSTRUCTIONS ° °Always review your discharge instruction sheet given to you by the facility where your surgery was performed. °IF YOU HAVE DISABILITY OR FAMILY LEAVE FORMS, YOU MUST BRING THEM TO THE OFFICE FOR PROCESSING.   °DO NOT GIVE THEM TO YOUR DOCTOR. ° °1. A  prescription for pain medication may be given to you upon discharge.  Take your pain medication as prescribed, if needed.  If narcotic pain medicine is not needed, then you may take acetaminophen (Tylenol) or ibuprofen (Advil) as needed. °2. Take your usually prescribed medications unless otherwise directed. °3. If you need a refill on your pain medication, please contact your pharmacy.  They will contact our office to request authorization. Prescriptions will not be filled after 5 pm or on week-ends. °4. You should follow a light diet the first 24 hours after arrival home, such as soup and crackers, etc.  Be sure to include lots of fluids daily.  Resume your normal diet the day after surgery. °5. Most patients will experience some swelling and bruising around the umbilicus or in the groin and scrotum.  Ice packs and reclining will help.  Swelling and bruising can take several days to resolve.  °6. It is common to experience some constipation if taking pain medication after surgery.  Increasing fluid intake and taking a stool softener (such as Colace) will usually help or prevent this problem from occurring.  A mild laxative (Milk of Magnesia or Miralax) should be taken according to package directions if there are no bowel movements after 48 hours. °7. Unless discharge instructions indicate otherwise, you may remove your bandages 24-48 hours after surgery, and you may shower at that time.  You may have steri-strips (small skin tapes) in place directly over the incision.  These strips should be left on the skin for 7-10 days.  If your surgeon used skin glue on the incision, you  may shower in 24 hours.  The glue will flake off over the next 2-3 weeks.  Any sutures or staples will be removed at the office during your follow-up visit. °8. ACTIVITIES:  You may resume regular (light) daily activities beginning the next day--such as daily self-care, walking, climbing stairs--gradually increasing activities as tolerated.  You may have sexual intercourse when it is comfortable.  Refrain from any heavy lifting or straining until approved by your doctor. °a. You may drive when you are no longer taking prescription pain medication, you can comfortably wear a seatbelt, and you can safely maneuver your car and apply brakes. °b. RETURN TO WORK:  __________________________________________________________ °9. You should see your doctor in the office for a follow-up appointment approximately 2-3 weeks after your surgery.  Make sure that you call for this appointment within a day or two after you arrive home to insure a convenient appointment time. °10. OTHER INSTRUCTIONS:  __________________________________________________________________________________________________________________________________________________________________________________________  °WHEN TO CALL YOUR DOCTOR: °1. Fever over 101.0 °2. Inability to urinate °3. Nausea and/or vomiting °4. Extreme swelling or bruising °5. Continued bleeding from incision. °6. Increased pain, redness, or drainage from the incision ° °The clinic staff is available to answer your questions during regular business hours.  Please don’t hesitate to call and ask to speak to one of the nurses for clinical concerns.  If you have a medical emergency, go to the nearest emergency room or call 911.  A surgeon from [facility] is always on call at the hospital ° ° °[address], [address], [place], [place]  [address] ? ° [address] [address], [place], [place]   [address] °[phone] ? [phone] ? FAX [phone] °Web site:  [email or web address] ° °

## 2014-11-23 ENCOUNTER — Encounter (HOSPITAL_COMMUNITY): Payer: Self-pay | Admitting: General Surgery

## 2014-12-25 NOTE — Telephone Encounter (Signed)
error 

## 2015-01-12 ENCOUNTER — Encounter (HOSPITAL_COMMUNITY): Payer: Self-pay | Admitting: Emergency Medicine

## 2015-01-12 ENCOUNTER — Emergency Department (HOSPITAL_COMMUNITY)
Admission: EM | Admit: 2015-01-12 | Discharge: 2015-01-12 | Disposition: A | Discharge status: Home / Self Care | Payer: Medicaid Other | Attending: Emergency Medicine | Admitting: Emergency Medicine

## 2015-01-12 DIAGNOSIS — Z8719 Personal history of other diseases of the digestive system: Secondary | ICD-10-CM | POA: Insufficient documentation

## 2015-01-12 DIAGNOSIS — Z880 Allergy status to penicillin: Secondary | ICD-10-CM | POA: Diagnosis not present

## 2015-01-12 DIAGNOSIS — N760 Acute vaginitis: Secondary | ICD-10-CM | POA: Diagnosis not present

## 2015-01-12 DIAGNOSIS — M549 Dorsalgia, unspecified: Secondary | ICD-10-CM | POA: Diagnosis not present

## 2015-01-12 DIAGNOSIS — Z91040 Latex allergy status: Secondary | ICD-10-CM | POA: Diagnosis not present

## 2015-01-12 DIAGNOSIS — Z862 Personal history of diseases of the blood and blood-forming organs and certain disorders involving the immune mechanism: Secondary | ICD-10-CM | POA: Diagnosis not present

## 2015-01-12 DIAGNOSIS — Z8632 Personal history of gestational diabetes: Secondary | ICD-10-CM | POA: Insufficient documentation

## 2015-01-12 DIAGNOSIS — H6692 Otitis media, unspecified, left ear: Secondary | ICD-10-CM | POA: Insufficient documentation

## 2015-01-12 DIAGNOSIS — Z3202 Encounter for pregnancy test, result negative: Secondary | ICD-10-CM | POA: Insufficient documentation

## 2015-01-12 DIAGNOSIS — J069 Acute upper respiratory infection, unspecified: Secondary | ICD-10-CM | POA: Diagnosis not present

## 2015-01-12 DIAGNOSIS — J029 Acute pharyngitis, unspecified: Secondary | ICD-10-CM

## 2015-01-12 DIAGNOSIS — L293 Anogenital pruritus, unspecified: Secondary | ICD-10-CM | POA: Diagnosis present

## 2015-01-12 DIAGNOSIS — B9689 Other specified bacterial agents as the cause of diseases classified elsewhere: Secondary | ICD-10-CM

## 2015-01-12 LAB — URINALYSIS, ROUTINE W REFLEX MICROSCOPIC
BILIRUBIN URINE: NEGATIVE
Glucose, UA: NEGATIVE mg/dL
HGB URINE DIPSTICK: NEGATIVE
Ketones, ur: NEGATIVE mg/dL
Leukocytes, UA: NEGATIVE
Nitrite: NEGATIVE
PH: 6 (ref 5.0–8.0)
Protein, ur: NEGATIVE mg/dL
Specific Gravity, Urine: 1.027 (ref 1.005–1.030)
Urobilinogen, UA: 0.2 mg/dL (ref 0.0–1.0)

## 2015-01-12 LAB — WET PREP, GENITAL
Trich, Wet Prep: NONE SEEN
Yeast Wet Prep HPF POC: NONE SEEN

## 2015-01-12 LAB — POC URINE PREG, ED: Preg Test, Ur: NEGATIVE

## 2015-01-12 LAB — RAPID STREP SCREEN (MED CTR MEBANE ONLY): STREPTOCOCCUS, GROUP A SCREEN (DIRECT): NEGATIVE

## 2015-01-12 MED ORDER — AZITHROMYCIN 250 MG PO TABS: 250.0000 mg | ORAL_TABLET | Freq: Every day | ORAL | Status: DC

## 2015-01-12 MED ORDER — LIDOCAINE HCL (PF) 1 % IJ SOLN
0.9000 mL | Freq: Once | INTRAMUSCULAR | Status: DC
  Filled 2015-01-12: qty 5

## 2015-01-12 MED ORDER — METRONIDAZOLE 500 MG PO TABS: 500.0000 mg | ORAL_TABLET | Freq: Two times a day (BID) | ORAL | Status: DC

## 2015-01-12 MED ORDER — CEFTRIAXONE SODIUM 250 MG IJ SOLR
250.0000 mg | Freq: Once | INTRAMUSCULAR | Status: DC
Start: 2015-01-12 — End: 2015-01-12
  Filled 2015-01-12: qty 250

## 2015-01-12 MED ORDER — AZITHROMYCIN 250 MG PO TABS
1000.0000 mg | ORAL_TABLET | Freq: Once | ORAL | Status: DC
  Filled 2015-01-12: qty 4

## 2015-01-12 NOTE — Discharge Instructions (Signed)
Please call your doctor for a followup appointment within 24-48 hours. When you talk to your doctor please let them know that you were seen in the emergency department and have them acquire all of your records so that they can discuss the findings with you and formulate a treatment plan to fully care for your new and ongoing problems. Please follow-up with health and wellness Center Please follow-up with ear, nose, throat physician Please follow-up with health or health department Please follow-up with women's Please avoid any sexual activity until all lab results have returned Recommend partner(s) within the past 3-6 months to be checked and assessed Please take medication as prescribed. While on Flagyl there is to be no drinking alcohol for this can result in worsening symptoms such as nausea and vomiting. Please continue to monitor symptoms closely and if symptoms are to worsen or change (fever greater than 101, chills, sweating, nausea, vomiting, chest pain, shortness of breathe, difficulty breathing, weakness, numbness, tingling, worsening or changes to pain pattern, neck swelling, visual distortions, headache, dizziness, inability to keep food or fluids down, worsening or changes to ear pain, swelling to the back of the ear, decreased hearing, inability to swallow, coughing up blood, stomach pain, irregular vaginal bleeding) please report back to the Emergency Department immediately.   Otitis Media Otitis media is redness, soreness, and puffiness (swelling) in the space just behind your eardrum (middle ear). It may be caused by allergies or infection. It often happens along with a cold. HOME CARE  Take your medicine as told. Finish it even if you start to feel better.  Only take over-the-counter or prescription medicines for pain, discomfort, or fever as told by your doctor.  Follow up with your doctor as told. GET HELP IF:  You have otitis media only in one ear, or bleeding from your  nose, or both.  You notice a lump on your neck.  You are not getting better in 3-5 days.  You feel worse instead of better. GET HELP RIGHT AWAY IF:   You have pain that is not helped with medicine.  You have puffiness, redness, or pain around your ear.  You get a stiff neck.  You cannot move part of your face (paralysis).  You notice that the bone behind your ear hurts when you touch it. MAKE SURE YOU:   Understand these instructions.  Will watch your condition.  Will get help right away if you are not doing well or get worse. Document Released: 01/23/2008 Document Revised: 08/11/2013 Document Reviewed: 03/03/2013 [facility] Patient Information 2015 [place], [place]. This information is not intended to replace advice given to you by your health care provider. Make sure you discuss any questions you have with your health care provider. Sore Throat A sore throat is pain, burning, irritation, or scratchiness of the throat. There is often pain or tenderness when swallowing or talking. A sore throat may be accompanied by other symptoms, such as coughing, sneezing, fever, and swollen neck glands. A sore throat is often the first sign of another sickness, such as a cold, flu, strep throat, or mononucleosis (commonly known as mono). Most sore throats go away without medical treatment. CAUSES  The most common causes of a sore throat include:  A viral infection, such as a cold, flu, or mono.  A bacterial infection, such as strep throat, tonsillitis, or whooping cough.  Seasonal allergies.  Dryness in the air.  Irritants, such as smoke or pollution.  Gastroesophageal reflux disease (GERD). HOME CARE INSTRUCTIONS  Only take over-the-counter medicines as directed by your caregiver.  Drink enough fluids to keep your urine clear or pale yellow.  Rest as needed.  Try using throat sprays, lozenges, or sucking on hard candy to ease any pain (if older than 4 years or as  directed).  Sip warm liquids, such as broth, herbal tea, or warm water with honey to relieve pain temporarily. You may also eat or drink cold or frozen liquids such as frozen ice pops.  Gargle with salt water (mix 1 tsp salt with 8 oz of water).  Do not smoke and avoid secondhand smoke.  Put a cool-mist humidifier in your bedroom at night to moisten the air. You can also turn on a hot shower and sit in the bathroom with the door closed for 5-10 minutes. SEEK IMMEDIATE MEDICAL CARE IF:  You have difficulty breathing.  You are unable to swallow fluids, soft foods, or your saliva.  You have increased swelling in the throat.  Your sore throat does not get better in 7 days.  You have nausea and vomiting.  You have a fever or persistent symptoms for more than 2-3 days.  You have a fever and your symptoms suddenly get worse. MAKE SURE YOU:   Understand these instructions.  Will watch your condition.  Will get help right away if you are not doing well or get worse. Document Released: 09/13/2004 Document Revised: 07/23/2012 Document Reviewed: 04/13/2012 [facility] Patient Information 2015 [place], [place]. This information is not intended to replace advice given to you by your health care provider. Make sure you discuss any questions you have with your health care provider. Bacterial Vaginosis Bacterial vaginosis is a vaginal infection that occurs when the normal balance of bacteria in the vagina is disrupted. It results from an overgrowth of certain bacteria. This is the most common vaginal infection in women of childbearing age. Treatment is important to prevent complications, especially in pregnant women, as it can cause a premature delivery. CAUSES  Bacterial vaginosis is caused by an increase in harmful bacteria that are normally present in smaller amounts in the vagina. Several different kinds of bacteria can cause bacterial vaginosis. However, the reason that the condition develops is  not fully understood. RISK FACTORS Certain activities or behaviors can put you at an increased risk of developing bacterial vaginosis, including:  Having a new sex partner or multiple sex partners.  Douching.  Using an intrauterine device (IUD) for contraception. Women do not get bacterial vaginosis from toilet seats, bedding, swimming pools, or contact with objects around them. SIGNS AND SYMPTOMS  Some women with bacterial vaginosis have no signs or symptoms. Common symptoms include:  Grey vaginal discharge.  A fishlike odor with discharge, especially after sexual intercourse.  Itching or burning of the vagina and vulva.  Burning or pain with urination. DIAGNOSIS  Your health care provider will take a medical history and examine the vagina for signs of bacterial vaginosis. A sample of vaginal fluid may be taken. Your health care provider will look at this sample under a microscope to check for bacteria and abnormal cells. A vaginal pH test may also be done.  TREATMENT  Bacterial vaginosis may be treated with antibiotic medicines. These may be given in the form of a pill or a vaginal cream. A second round of antibiotics may be prescribed if the condition comes back after treatment.  HOME CARE INSTRUCTIONS   Only take over-the-counter or prescription medicines as directed by your health care provider.  If  antibiotic medicine was prescribed, take it as directed. Make sure you finish it even if you start to feel better.  Do not have sex until treatment is completed.  Tell all sexual partners that you have a vaginal infection. They should see their health care provider and be treated if they have problems, such as a mild rash or itching.  Practice safe sex by using condoms and only having one sex partner. SEEK MEDICAL CARE IF:   Your symptoms are not improving after 3 days of treatment.  You have increased discharge or pain.  You have a fever. MAKE SURE YOU:   Understand these  instructions.  Will watch your condition.  Will get help right away if you are not doing well or get worse. FOR MORE INFORMATION  Centers for Disease Control and Prevention, Division of STD Prevention: SolutionApps.co.za American Sexual Health Association (ASHA): www.ashastd.org  Document Released: 08/06/2005 Document Revised: 05/27/2013 Document Reviewed: 03/18/2013 [facility] Patient Information 2015 [place], [place]. This information is not intended to replace advice given to you by your health care provider. Make sure you discuss any questions you have with your health care provider.   Emergency Department Resource Guide 1) Find a Doctor and Pay Out of Pocket Although you won't have to find out who is covered by your insurance plan, it is a good idea to ask around and get recommendations. You will then need to call the office and see if the doctor you have chosen will accept you as a new patient and what types of options they offer for patients who are self-pay. Some doctors offer discounts or will set up payment plans for their patients who do not have insurance, but you will need to ask so you aren't surprised when you get to your appointment.  2) Contact Your Local Health Department Not all health departments have doctors that can see patients for sick visits, but many do, so it is worth a call to see if yours does. If you don't know where your local health department is, you can check in your phone book. The CDC also has a tool to help you locate your state's health department, and many state websites also have listings of all of their local health departments.  3) Find a Walk-in Clinic If your illness is not likely to be very severe or complicated, you may want to try a walk in clinic. These are popping up all over the country in pharmacies, drugstores, and shopping centers. They're usually staffed by nurse practitioners or physician assistants that have been trained to treat common illnesses  and complaints. They're usually fairly quick and inexpensive. However, if you have serious medical issues or chronic medical problems, these are probably not your best option.  No Primary Care Doctor: - Call Health Connect at  [phone] - they can help you locate a primary care doctor that  accepts your insurance, provides certain services, etc. - Physician Referral Service- [phone]  Chronic Pain Problems: Organization         Address  Phone   Notes  [facility]  [phone] Patients need to be referred by their primary care doctor.   Medication Assistance: Organization         Address  Phone   Notes  [facility] Medication [facility] [address] [place], [place] [address] [phone] --Must be a resident of [facility] -- Must have NO insurance coverage whatsoever (no Medicaid/ Medicare, etc.) -- The  pt. MUST have a primary care doctor that directs their care regularly and follows them in the community   MedAssist  [phone]   [employer]  [phone]    Agencies that provide inexpensive medical care: Organization         Address  Phone   Notes  [facility]  [phone]   [facility]    [phone]   [facility] [address] [place], [place] [address] [phone]   [facility] [address] [place] [place] [phone]   Planned Parenthood    [phone]   [facility]    [phone]   [facility]  [address], [place] Phone:  [phone], Fax:  [phone] Hours of Operation:  9 am - 6 pm, M-F.  Also accepts Medicaid/Medicare and self-pay.  [facility]  [address], [place] Phone: [phone], Fax: [phone]. Hours of Operation:  8:30 am - 5:30 pm, M-F.  Also accepts Medicaid and self-pay.  [facility] [place] [address] [place] Phone: [phone]   [facility] [address] Natasha Bence [place], [place] [phone], Ext. [phone] Mondays & Thursdays: 7-9 AM.  First 15 patients are seen on a first come, first serve basis.    Medicaid-accepting [facility] Providers:  Organization         Address  Phone   Notes  [facility] [address], [place] [phone] Also accepts self-pay patients.  [facility] [address] Laurell Josephs [place], [place]  [phone]   [facility] [address], [place] [phone]   [facility] [address] [place] [phone]   Renaye Rakers [address], [place]   [phone] Only accepts [place] patients after they have their name applied to their card.   Self-Pay (no insurance) in [facility]:  Organization         Address  Phone   Notes  Sickle Cell Patients, [facility] Internal Medicine [address] [place], [place] [phone]   [facility] [address] [place], [place] [phone]   [facility] [place]  [address], [place] [phone]   [facility]/Dr. Osei-Bonsu  [address], [place] or [address], [place] [phone] Phone number for both [place] and [place] locations is the same.  [facility] [address], [place] [phone]   [facility] [address], [place] or [address] [phone]   [facility] [address], [place] [phone], phone; [phone], fax Sees patients 1st and 3rd Saturday of every month.  Must not qualify for public or private insurance (i.e. Medicaid, Medicare, [place] Health Choice, Veterans' Benefits)  Household income should be no more than 200% of the poverty level  The clinic cannot treat you if you are pregnant or think you are pregnant  Sexually transmitted diseases are not treated at the clinic.    Dental Care: Organization         Address  Phone  Notes  [facility] [address]  Joellyn Quails, [place] [phone] Accepts children up to age 72 who are enrolled in Medicaid or [place] Health Choice; pregnant women with a Medicaid card; and children who have applied for Medicaid or [place] Health Choice, but were declined, whose parents can pay a reduced fee at time of service.  [facility]  [address], [place] [phone] Accepts children up to age 44 who are enrolled in [place] or [place] Health Choice; pregnant women with a Medicaid card; and children who have applied for Medicaid or [place] Health Choice, but were declined, whose parents can pay a reduced fee at time of service.  [facility]  [address] [place], [place] [phone] Patients are seen by appointment only. Walk-ins are not accepted. [facility] will see patients 19 years of age and older. Monday - Tuesday (8am-5pm) Most Wednesdays (8:30-5pm) $30 per visit, cash only  [facility]  [address], [facility] [phone] Patients are seen by appointment only. Walk-ins are not accepted. [facility] will see patients 82 years of age and older. One Wednesday Evening (Monthly: Volunteer Based).  $30 per visit, cash only  [employer]  [phone] for adults; Children under age 9, call Graduate Pediatric Dentistry at [phone]. Children aged 9-14, please call [phone] to request a pediatric application.  Dental services are provided in all areas of dental care including fillings, crowns and bridges, complete and partial dentures, implants, gum treatment, root canals, and extractions. Preventive care is  also provided. Treatment is provided to both adults and children. Patients are selected via a lottery and there is often a waiting list.   [facility] [address], [place]  [phone] [email or web address]   [facility] [address] [place], [place] [phone], Ext. [phone] Second and Fourth Thursday of each month, opens at 6:30 AM; Clinic ends at 9 AM.  Patients are seen on a first-come first-served basis, and a limited number are seen during each clinic.   [facility]  [address] Ether Griffins [place], [place] [phone]   Eligibility Requirements You must have lived in [place], [place], or [place] counties for at least the last three months.   You cannot be eligible for state or federal sponsored [place], including [employer], [place], or [employer].   You generally cannot be eligible for healthcare insurance through your employer.    How to apply: Eligibility screenings are held every Tuesday and Wednesday afternoon from 1:00 pm until 4:00 pm. You do not need an appointment for the interview!  [facility] [address], [place], [place] [phone]   [facility] Health Department  [phone]   [facility] Health Department  [phone]   [facility] Health Department  [phone]    Behavioral Health Resources in the Community: Intensive Outpatient Programs Organization         Address  Phone  Notes  [facility] [address], [place], [place] [phone]   [facility] Outpatient [address] [place], [place] [phone]   [facility] [address] [place], [place]  [phone]   [facility] Mental Health [address],  [place], [place] [phone] or [phone]   Substance Abuse Resources Organization         Address  Phone  Notes  [facility] Drug  Services  [phone]   [facility]  [phone]   [facility]  [phone]   [facility]  [phone]   Residential & Outpatient Substance Abuse Program  [phone]   Psychological Services Organization         Address  Phone  Notes  [facility]  [phone]   [facility]  [phone]- [phone]   [facility] [address], [place] [phone] or [phone]    Mobile Crisis Teams Organization         Address  Phone  Notes  [facility], Mobile Crisis Care Unit  [phone]   [facility]  [address] [place], [place] [phone]   Doristine LocksSharon DeEsch [address] [place] [phone]    Self-Help/Support Groups Organization         Address  Phone             Notes  [facility] [place] - variety of support groups  336- [phone] Call for more information  Narcotics Anonymous (NA), [facility] [address], [place]  2 meetings at this location   Statisticianesidential Treatment Programs Organization         Address  Phone  Notes  [facility] [address],    [place]  [phone]   [facility]  [address], [place] [phone], [place], [place] [phone]   [facility] [address], [place] [phone] Admissions: 8am-3pm M-F  [facility] [address]    [place], [place] [phone]   [facility] [address], [place], [place] [phone]   [facility] [address]  [place], [place] [phone]   [facility] - Intensive Outpatient [address], [place], [place] [phone]   [facility] ([facility] [address]  [place], [place] [phone] or [phone]   [facility] ([facility]) [address] [place], [place] [phone] Accepts Medicaid  [facility] [address]  [place] [phone]  Substance Abuse/Addiction Treatment   [facility] Resources Organization         Address  Phone  Notes  [facility]  [phone]   Angie FavaJulie Brannon, PhD [address], [address] [place], [place]   [phone] or [phone](336) [phone]   [facility]   [address] [place], [place] [phone]) [phone]   [facility] [address], [place], [place] [phone](336) [phone] Insurance/Medicaid/sponsorship through [facility]  [facility] [address]                                    [place], [place] [phone](336) [phone] Therapy/tele-psych/case  [facility] [address].   [place], [place] [phone]    Dr. Lolly MustacheArfeen  [phone]   [facility] [place]  [employer] [facility] 1) [address],  2) [address], [place] 3)  [address], [place] [phone]) [phone]  [phone]   [facility] Child Abuse Hotline [phone](336) [phone] or [phone](336) [phone] (After Hours)

## 2015-01-12 NOTE — ED Provider Notes (Signed)
CSN: [id number]     Arrival date & time 01/12/15  1906 History  This chart was scribed for Raymon Mutton, PA-C, working with Arby Barrette, MD by Chestine Spore, ED Scribe. The patient was seen in room TR07C/TR07C at 9:02 PM.    Chief Complaint  Patient presents with  . Sore Throat  . Vaginal Itching     The history is provided by the patient. No language interpreter was used.    HPI Comments: Monique Schmidt is a 29 y.o. female with a medical hx of HA, Thrombocytopenia, gestational DM, and umbilical hernia who presents to the Emergency Department complaining of sore throat and vaginal discharge onset 2 days. Patient stated that the sore throat is worse with swallowing, but states that she is able to swallow. Patient reported that she has an associated dry cough with left ear pressure. Patient reports that she has positive sick contacts of her mother and sister that recently had a 24-hour virus. Patient also reports she is allergic to pollen. Denied taking any medications for relief. Stated that she's been experiencing vaginal itching for the past 2 days with a vaginal discharge with a fishy odor. States that her last menstrual period was in February 2016-reported that her periods normally regular. Patient reports that she is sexually active and sometimes uses protection. Reported that she's been having mild lower back pain. Denied birth control. Denied fever, chills, chest pain, shortness of breath, difficulty breathing, difficulty swallowing, neck pain, neck stiffness, blurred vision, sudden loss of vision, changes to voice, nausea, vomiting, diarrhea, pelvic pain, abdominal pain, melanotic hematochezia, dysuria. PCP none  Past Medical History  Diagnosis Date  . Abnormal Pap smear   . Gestational diabetes     all pregnancies glyburide started 11/4  . Thrombocytopenia   . Headache(784.0)   . Vaginal Pap smear, abnormal   . Hernia, umbilical    Past Surgical History  Procedure Laterality  Date  . Wisdom tooth extraction    . Umbilical hernia repair N/A 11/22/2014    Procedure: LAPAROSCOPIC UMBILICAL HERNIA;  Surgeon: Axel Filler, MD;  Location: [facility] OR;  Service: General;  Laterality: N/A;   Family History  Problem Relation Age of Onset  . Hypertension Father   . Diabetes Father   . Diabetes Mother   . Seizures Brother    History  Substance Use Topics  . Smoking status: Never Smoker   . Smokeless tobacco: Never Used  . Alcohol Use: No   OB History    Gravida Para Term Preterm AB TAB SAB Ectopic Multiple Living   Review of Systems  Constitutional: Negative for fever and chills.  HENT: Positive for ear pain (left) and sore throat. Negative for congestion, trouble swallowing and voice change.   Eyes: Negative for visual disturbance.  Respiratory: Positive for cough. Negative for shortness of breath.   Cardiovascular: Negative for chest pain.  Gastrointestinal: Negative for nausea, vomiting, abdominal pain and blood in stool.  Genitourinary: Positive for vaginal discharge. Negative for dysuria, hematuria and pelvic pain.       Vaginal itching  Musculoskeletal: Positive for back pain. Negative for neck pain and neck stiffness.      Allergies  Penicillins and Latex  Home Medications   Prior to Admission medications   Medication Sig Start Date End Date Taking? Authorizing Provider  azithromycin (ZITHROMAX) 250 MG tablet Take 1 tablet (250 mg total) by mouth daily. Take  first 2 tablets together, then 1 every day until finished. 01/12/15   Pratham Cassatt, PA-C  metroNIDAZOLE (FLAGYL) 500 MG tablet Take 1 tablet (500 mg total) by mouth 2 (two) times daily. 01/12/15   Leshae Mcclay, PA-C  Multiple Vitamins-Minerals (MULTIVITAMIN WITH MINERALS) tablet Take 1 tablet by mouth daily.    Historical Provider, MD  oxyCODONE-acetaminophen (ROXICET) 5-325 MG per tablet Take 1-2 tablets by mouth every 4 (four) hours as needed. 11/22/14   Axel Filler,  MD   BP 129/94 mmHg  Pulse 80  Temp(Src) 97.5 F (36.4 C) (Oral)  Resp 16  SpO2 100% Physical Exam  Constitutional: She is oriented to person, place, and time. She appears well-developed and well-nourished. No distress.  HENT:  Head: Normocephalic and atraumatic.  Right Ear: Hearing and external ear normal.  Left Ear: Hearing, external ear and ear canal normal. No tenderness. No mastoid tenderness. Tympanic membrane is erythematous. Tympanic membrane is not perforated, not retracted and not bulging.  Mouth/Throat: Oropharynx is clear and moist. No oropharyngeal exudate.  Negative swelling, erythema, inflammation, lesions, sores, deformities, exudate identified to the posterior oropharynx and tonsils bilaterally. Negative petechiae noted to soft palate. Negative ulcerations or pustules identified to the posterior oropharynx. Uvula midline with medical elevation. Negative uvula swelling. Negative swelling to the peritonsillar region-negative pain upon palpation to peritonsillar region. Negative sublingual lesions-negative tenderness upon palpation to the floor of the mouth. Negative trismus.   Right ear unremarkable. Left ear noted swelling with erythema identified to the tympanic membrane. Negative findings of perforation.  Eyes: Conjunctivae and EOM are normal. Pupils are equal, round, and reactive to light. Right eye exhibits no discharge. Left eye exhibits no discharge.  Neck: Normal range of motion. Neck supple. No tracheal deviation present.  Negative neck stiffness Negative nuchal rigidity Negative cervical lymphadenopathy Negative meningeal signs  Cardiovascular: Normal rate, regular rhythm and normal heart sounds.  Exam reveals no friction rub.   No murmur heard. Pulmonary/Chest: Effort normal and breath sounds normal. No respiratory distress. She has no wheezes. She has no rales.  Patient is able to speak in full sentences without difficulty Negative use of excess her  muscles Negative stridor  Abdominal: Soft. Bowel sounds are normal. She exhibits no distension. There is no tenderness. There is no rebound and no guarding.  Genitourinary: Vaginal discharge found.  Pelvic exam: Negative swelling, erythema, inflammation, lesions, sores, deformities, area fluctuance or induration identified to the external genitalia. Negative bright red blood in the vaginal vault. Thick white discharge identified with an odor. Cervix identified with negative friability. Negative CMT, bilateral adnexal tenderness. Exam chaperoned with nurse, Clydie Braun.  Musculoskeletal: Normal range of motion.  Lymphadenopathy:    She has no cervical adenopathy.  Neurological: She is alert and oriented to person, place, and time. No cranial nerve deficit. She exhibits normal muscle tone. Coordination normal.  Skin: Skin is warm and dry. No rash noted. She is not diaphoretic. No erythema.  Psychiatric: She has a normal mood and affect. Her behavior is normal. Thought content normal.  Nursing note and vitals reviewed.   ED Course  Procedures (including critical care time) DIAGNOSTIC STUDIES: Oxygen Saturation is 100% on RA, nl by my interpretation.    COORDINATION OF CARE: 9:17 PM-Discussed treatment plan which includes pelvic exam, rapid strep screen and culture, UA with pt at bedside and pt agreed to plan.   Results for orders placed or performed during the hospital encounter of 01/12/15  Rapid strep screen  Result Value Ref Range  Streptococcus, Group A Screen (Direct) NEGATIVE NEGATIVE  Wet prep, genital  Result Value Ref Range   Yeast Wet Prep HPF POC NONE SEEN NONE SEEN   Trich, Wet Prep NONE SEEN NONE SEEN   Clue Cells Wet Prep HPF POC MANY (A) NONE SEEN   WBC, Wet Prep HPF POC RARE (A) NONE SEEN  Urinalysis, Routine w reflex microscopic  Result Value Ref Range   Color, Urine YELLOW YELLOW   APPearance HAZY (A) CLEAR   Specific Gravity, Urine 1.027 1.005 - 1.030   pH 6.0 5.0 -  8.0   Glucose, UA NEGATIVE NEGATIVE mg/dL   Hgb urine dipstick NEGATIVE NEGATIVE   Bilirubin Urine NEGATIVE NEGATIVE   Ketones, ur NEGATIVE NEGATIVE mg/dL   Protein, ur NEGATIVE NEGATIVE mg/dL   Urobilinogen, UA 0.2 0.0 - 1.0 mg/dL   Nitrite NEGATIVE NEGATIVE   Leukocytes, UA NEGATIVE NEGATIVE  POC Urine Pregnancy, ED (do NOT order at [facility])  Result Value Ref Range   Preg Test, Ur NEGATIVE NEGATIVE    Labs Review Labs Reviewed  WET PREP, GENITAL - Abnormal; Notable for the following:    Clue Cells Wet Prep HPF POC MANY (*)    WBC, Wet Prep HPF POC RARE (*)    All other components within normal limits  URINALYSIS, ROUTINE W REFLEX MICROSCOPIC (NOT AT [facility]) - Abnormal; Notable for the following:    APPearance HAZY (*)    All other components within normal limits  RAPID STREP SCREEN (NOT AT [facility])  CULTURE, GROUP A STREP  POC URINE PREG, ED  GC/CHLAMYDIA PROBE AMP ([place]) NOT AT [facility]    Imaging Review No results found.   EKG Interpretation None      MDM   Final diagnoses:  Acute left otitis media, recurrence not specified, unspecified otitis media type  Sore throat  Bacterial vaginosis    Medications - No data to display  Filed Vitals:   01/12/15 1910 01/12/15 2250  BP: 134/85 129/94  Pulse: 93 80  Temp: 98.3 F (36.8 C) 97.5 F (36.4 C)  TempSrc:  Oral  Resp: 18 16  SpO2: 100% 100%   I personally performed the services described in this documentation, which was scribed in my presence. The recorded information has been reviewed and is accurate.  Rapid strep test negative. Urinalysis negative for hemoglobin, nitrites, leukocytes-negative findings of infection. Urine pregnancy negative. Wet prep noted many clue cells with negative findings of yeast, Trichomonas, white blood cells. Patient presenting to the ED with vaginal itching and discharge that started 2 days ago-wet prep identified many clue cells consistent with bacterial vaginosis. Pelvic exam  unremarkable-negative tenderness-doubt PID or cervicitis. Negative findings of UTI or pyelonephritis. Rapid strep test negative-doubt peritonsillar abscess, retropharyngeal abscess, Ludwig's angina. Patient able to speak in full sentences, patient able to tolerate fluids by mouth without difficulty. On physical examination beginnings of left otitis media noted - doubt mastoiditis. Patient stable, afebrile. Patient not septic appearing. Negative signs of respiratory distress. Discharged patient. Discharged patient with anti-biotics. Discussed with patient to rest and stay hydrated. Referred [facility] to [facility], ear nose and throat physician, and [facility]. Discussed with patient to avoid any sexual activity until all results have returned. Discussed with patient to closely monitor symptoms and if symptoms are to worsen or change to report back to the ED - strict return instructions given.  Patient agreed to plan of care, understood, all questions answered.   Raymon MuttonMarissa Zuriyah Shatz, PA-C 01/12/15 2303  Arby Barrette, MD 01/14/15 [phone]

## 2015-01-12 NOTE — ED Notes (Signed)
Pt. reports sore throat with occasional dry cough onset Monday , denies fever or chills, pt. also requesting pregnancy test.

## 2015-01-13 LAB — GC/CHLAMYDIA PROBE AMP (~~LOC~~) NOT AT ARMC
Chlamydia: NEGATIVE
NEISSERIA GONORRHEA: NEGATIVE

## 2015-01-15 LAB — CULTURE, GROUP A STREP

## 2015-03-25 ENCOUNTER — Emergency Department (HOSPITAL_COMMUNITY)
Admission: EM | Admit: 2015-03-25 | Discharge: 2015-03-25 | Disposition: A | Discharge status: Home / Self Care | Payer: Medicaid Other | Attending: Emergency Medicine | Admitting: Emergency Medicine

## 2015-03-25 ENCOUNTER — Emergency Department (HOSPITAL_COMMUNITY)
Admission: EM | Admit: 2015-03-25 | Discharge: 2015-03-25 | Disposition: A | Discharge status: Other Acute Inpatient Hospital | Payer: Medicaid Other

## 2015-03-25 ENCOUNTER — Encounter (HOSPITAL_COMMUNITY): Payer: Self-pay

## 2015-03-25 DIAGNOSIS — G43909 Migraine, unspecified, not intractable, without status migrainosus: Secondary | ICD-10-CM | POA: Diagnosis not present

## 2015-03-25 DIAGNOSIS — R1032 Left lower quadrant pain: Secondary | ICD-10-CM | POA: Insufficient documentation

## 2015-03-25 DIAGNOSIS — Z8719 Personal history of other diseases of the digestive system: Secondary | ICD-10-CM | POA: Diagnosis not present

## 2015-03-25 DIAGNOSIS — Z3202 Encounter for pregnancy test, result negative: Secondary | ICD-10-CM | POA: Diagnosis not present

## 2015-03-25 DIAGNOSIS — Z862 Personal history of diseases of the blood and blood-forming organs and certain disorders involving the immune mechanism: Secondary | ICD-10-CM | POA: Diagnosis not present

## 2015-03-25 DIAGNOSIS — Z8632 Personal history of gestational diabetes: Secondary | ICD-10-CM | POA: Diagnosis not present

## 2015-03-25 DIAGNOSIS — Z9889 Other specified postprocedural states: Secondary | ICD-10-CM | POA: Diagnosis not present

## 2015-03-25 DIAGNOSIS — Z91040 Latex allergy status: Secondary | ICD-10-CM | POA: Diagnosis not present

## 2015-03-25 DIAGNOSIS — Z880 Allergy status to penicillin: Secondary | ICD-10-CM | POA: Diagnosis not present

## 2015-03-25 DIAGNOSIS — R51 Headache: Secondary | ICD-10-CM | POA: Diagnosis present

## 2015-03-25 LAB — I-STAT BETA HCG BLOOD, ED (MC, WL, AP ONLY)

## 2015-03-25 LAB — CBC
HCT: 42.7 % (ref 36.0–46.0)
HEMOGLOBIN: 14.3 g/dL (ref 12.0–15.0)
MCH: 27.4 pg (ref 26.0–34.0)
MCHC: 33.5 g/dL (ref 30.0–36.0)
MCV: 82 fL (ref 78.0–100.0)
Platelets: 164 10*3/uL (ref 150–400)
RBC: 5.21 MIL/uL — ABNORMAL HIGH (ref 3.87–5.11)
RDW: 15.2 % (ref 11.5–15.5)
WBC: 7.7 10*3/uL (ref 4.0–10.5)

## 2015-03-25 LAB — COMPREHENSIVE METABOLIC PANEL
ALBUMIN: 4 g/dL (ref 3.5–5.0)
ALT: 18 U/L (ref 14–54)
ANION GAP: 9 (ref 5–15)
AST: 22 U/L (ref 15–41)
Alkaline Phosphatase: 70 U/L (ref 38–126)
BILIRUBIN TOTAL: 0.6 mg/dL (ref 0.3–1.2)
BUN: 10 mg/dL (ref 6–20)
CO2: 26 mmol/L (ref 22–32)
Calcium: 9.3 mg/dL (ref 8.9–10.3)
Chloride: 104 mmol/L (ref 101–111)
Creatinine, Ser: 0.65 mg/dL (ref 0.44–1.00)
GFR calc non Af Amer: >60 mL/min (ref >60)
GLUCOSE: 130 mg/dL — AB (ref 65–99)
POTASSIUM: 3.7 mmol/L (ref 3.5–5.1)
SODIUM: 139 mmol/L (ref 135–145)
TOTAL PROTEIN: 7.8 g/dL (ref 6.5–8.1)

## 2015-03-25 LAB — URINALYSIS, ROUTINE W REFLEX MICROSCOPIC
Bilirubin Urine: NEGATIVE
Glucose, UA: NEGATIVE mg/dL
HGB URINE DIPSTICK: NEGATIVE
Ketones, ur: NEGATIVE mg/dL
LEUKOCYTES UA: NEGATIVE
Nitrite: NEGATIVE
Protein, ur: NEGATIVE mg/dL
Specific Gravity, Urine: 1.026 (ref 1.005–1.030)
Urobilinogen, UA: 0.2 mg/dL (ref 0.0–1.0)
pH: 6.5 (ref 5.0–8.0)

## 2015-03-25 LAB — LIPASE, BLOOD: LIPASE: 16 U/L — AB (ref 22–51)

## 2015-03-25 MED ORDER — KETOROLAC TROMETHAMINE 30 MG/ML IJ SOLN
30.0000 mg | Freq: Once | INTRAMUSCULAR | Status: AC
Start: 2015-03-25 — End: 2015-03-25
  Administered 2015-03-25: 30 mg via INTRAVENOUS
  Filled 2015-03-25: qty 1

## 2015-03-25 MED ORDER — METOCLOPRAMIDE HCL 5 MG/ML IJ SOLN
10.0000 mg | Freq: Once | INTRAMUSCULAR | Status: AC
Start: 2015-03-25 — End: 2015-03-25
  Administered 2015-03-25: 10 mg via INTRAVENOUS
  Filled 2015-03-25: qty 2

## 2015-03-25 MED ORDER — DIPHENHYDRAMINE HCL 50 MG/ML IJ SOLN
25.0000 mg | Freq: Once | INTRAMUSCULAR | Status: AC
  Administered 2015-03-25: 25 mg via INTRAVENOUS
  Filled 2015-03-25: qty 1

## 2015-03-25 MED ORDER — SODIUM CHLORIDE 0.9 % IV BOLUS (SEPSIS)
1000.0000 mL | Freq: Once | INTRAVENOUS | Status: AC
  Administered 2015-03-25: 1000 mL via INTRAVENOUS

## 2015-03-25 NOTE — ED Provider Notes (Signed)
CSN: [id number]     Arrival date & time 03/25/15  1705 History  This chart was scribed for non-physician practitioner, Emilia Beck, PA-C working with Mancel Bale, MD by Doreatha Martin, ED scribe. This patient was seen in room WTR9/WTR9 and the patient's care was started at 8:49 PM    Chief Complaint  Patient presents with  . Migraine  . Abdominal Pain  . Back Pain   The history is provided by the patient. No language interpreter was used.    HPI Comments: Monique Schmidt is a 29 y.o. female with hx of HA, thrombocytopenia who presents to the Emergency Department complaining of moderate, throbbing HA onset 5 days ago. She states associated moderate upper back pain onset 5 days ago and abdominal pain. She states that she lifted up a heavy object a few days ago causing her abdomen to hurt at the site of her hernia repair. No OTC medications tried PTA. She states Hx of umbilical hernia repair 11/22/14. No head injury noted. She denies fever, vision changes, difficulty walking.    Past Medical History  Diagnosis Date  . Abnormal Pap smear   . Gestational diabetes     all pregnancies glyburide started 11/4  . Thrombocytopenia   . Headache(784.0)   . Vaginal Pap smear, abnormal   . Hernia, umbilical    Past Surgical History  Procedure Laterality Date  . Wisdom tooth extraction    . Umbilical hernia repair N/A 11/22/2014    Procedure: LAPAROSCOPIC UMBILICAL HERNIA;  Surgeon: Axel Filler, MD;  Location: [facility] OR;  Service: General;  Laterality: N/A;   Family History  Problem Relation Age of Onset  . Hypertension Father   . Diabetes Father   . Diabetes Mother   . Seizures Brother    History  Substance Use Topics  . Smoking status: Never Smoker   . Smokeless tobacco: Never Used  . Alcohol Use: No   OB History    Gravida Para Term Preterm AB TAB SAB Ectopic Multiple Living   5 4 4  1  1   4      Review of Systems  Constitutional: Negative for fever.  Eyes: Negative for visual  disturbance.  Gastrointestinal: Positive for abdominal pain.  Musculoskeletal: Positive for back pain.  Neurological: Positive for headaches.  All other systems reviewed and are negative.   Allergies  Penicillins and Latex  Home Medications   Prior to Admission medications   Medication Sig Start Date End Date Taking? Authorizing Provider  oxyCODONE-acetaminophen (PERCOCET) 10-325 MG per tablet Take 1 tablet by mouth 3 (three) times daily as needed for pain.  03/16/15  Yes Historical Provider, MD  azithromycin (ZITHROMAX) 250 MG tablet Take 1 tablet (250 mg total) by mouth daily. Take first 2 tablets together, then 1 every day until finished. Patient not taking: Reported on 03/25/2015 01/12/15   Marissa Sciacca, PA-C  metroNIDAZOLE (FLAGYL) 500 MG tablet Take 1 tablet (500 mg total) by mouth 2 (two) times daily. Patient not taking: Reported on 03/25/2015 01/12/15   Marissa Sciacca, PA-C  oxyCODONE-acetaminophen (ROXICET) 5-325 MG per tablet Take 1-2 tablets by mouth every 4 (four) hours as needed. Patient not taking: Reported on 03/25/2015 11/22/14   Axel Filler, MD   BP 140/88 mmHg  Pulse 95  Temp(Src) 98.2 F (36.8 C) (Oral)  SpO2 99%  LMP 03/10/2015 Physical Exam  Constitutional: She is oriented to person, place, and time. She appears well-developed and well-nourished. No distress.  HENT:  Head: Normocephalic and  atraumatic.  Eyes: Conjunctivae and EOM are normal. Pupils are equal, round, and reactive to light.  Neck: Normal range of motion. Neck supple.  Cardiovascular: Normal rate and regular rhythm.  Exam reveals no gallop and no friction rub.   No murmur heard. Pulmonary/Chest: Effort normal and breath sounds normal. No respiratory distress. She has no wheezes. She has no rales. She exhibits no tenderness.  Abdominal: Soft. She exhibits no distension. There is tenderness. There is no rebound.  Mild LLQ tenderness to palpation. No other focal tenderness.   Musculoskeletal: Normal  range of motion.  Neurological: She is alert and oriented to person, place, and time. Coordination normal.  Speech is goal-oriented. Moves limbs without ataxia.   Skin: Skin is warm and dry.  Psychiatric: She has a normal mood and affect. Her behavior is normal.  Nursing note and vitals reviewed.   ED Course  Procedures (including critical care time) DIAGNOSTIC STUDIES: Oxygen Saturation is 99% on RA, normal by my interpretation.    COORDINATION OF CARE: 8:53 PM Discussed treatment plan with pt at bedside and pt agreed to plan.   Labs Review Labs Reviewed  LIPASE, BLOOD - Abnormal; Notable for the following:    Lipase 16 (*)    All other components within normal limits  COMPREHENSIVE METABOLIC PANEL - Abnormal; Notable for the following:    Glucose, Bld 130 (*)    All other components within normal limits  CBC - Abnormal; Notable for the following:    RBC 5.21 (*)    All other components within normal limits  URINALYSIS, ROUTINE W REFLEX MICROSCOPIC (NOT AT [facility]) - Abnormal; Notable for the following:    APPearance CLOUDY (*)    All other components within normal limits  I-STAT BETA HCG BLOOD, ED ([facility], [facility], [facility] ONLY)    Imaging Review No results found.   EKG Interpretation None      MDM   Final diagnoses:  Migraine without status migrainosus, not intractable, unspecified migraine type  Left lower quadrant pain    10:18 PM Patient reports improvement of her headache and abdominal pain. Vitals stable and patient afebrile. Labs and urinalysis without acute changes. No signs of acute infection.   I personally performed the services described in this documentation, which was scribed in my presence. The recorded information has been reviewed and is accurate.    Emilia Beck, PA-C 03/25/15 2219  Mancel Bale, MD 03/25/15 [phone]

## 2015-03-25 NOTE — Discharge Instructions (Signed)
Refer to attached documents for more information. Return to the ED with worsening or concerning symptoms.  °

## 2015-03-25 NOTE — ED Notes (Signed)
Patient c/o intermittent migraine x 1 week. Patient denies sensitivity to light. Patient has not taken anything for her migraine. Patient also c/o low back pain x 1 week. Patient denies any dysuria or heavy lifting. Patient also c/o lower abdominal pain that started today with diarrhea. Patient denies any vaginal discharge  Or dysuria.

## 2015-04-12 ENCOUNTER — Encounter (HOSPITAL_COMMUNITY): Payer: Self-pay | Admitting: Emergency Medicine

## 2015-04-12 ENCOUNTER — Emergency Department (HOSPITAL_COMMUNITY)
Admission: EM | Admit: 2015-04-12 | Discharge: 2015-04-12 | Disposition: A | Discharge status: Home / Self Care | Payer: Medicaid Other | Attending: Emergency Medicine | Admitting: Emergency Medicine

## 2015-04-12 DIAGNOSIS — N390 Urinary tract infection, site not specified: Secondary | ICD-10-CM | POA: Diagnosis not present

## 2015-04-12 DIAGNOSIS — Z880 Allergy status to penicillin: Secondary | ICD-10-CM | POA: Insufficient documentation

## 2015-04-12 DIAGNOSIS — Z8632 Personal history of gestational diabetes: Secondary | ICD-10-CM | POA: Insufficient documentation

## 2015-04-12 DIAGNOSIS — J029 Acute pharyngitis, unspecified: Secondary | ICD-10-CM | POA: Diagnosis present

## 2015-04-12 DIAGNOSIS — Z91040 Latex allergy status: Secondary | ICD-10-CM | POA: Insufficient documentation

## 2015-04-12 DIAGNOSIS — Z862 Personal history of diseases of the blood and blood-forming organs and certain disorders involving the immune mechanism: Secondary | ICD-10-CM | POA: Insufficient documentation

## 2015-04-12 DIAGNOSIS — H6591 Unspecified nonsuppurative otitis media, right ear: Secondary | ICD-10-CM | POA: Diagnosis not present

## 2015-04-12 DIAGNOSIS — R0602 Shortness of breath: Secondary | ICD-10-CM | POA: Diagnosis not present

## 2015-04-12 DIAGNOSIS — Z3202 Encounter for pregnancy test, result negative: Secondary | ICD-10-CM | POA: Insufficient documentation

## 2015-04-12 DIAGNOSIS — H6091 Unspecified otitis externa, right ear: Secondary | ICD-10-CM | POA: Diagnosis not present

## 2015-04-12 DIAGNOSIS — H6691 Otitis media, unspecified, right ear: Secondary | ICD-10-CM

## 2015-04-12 LAB — CBC WITH DIFFERENTIAL/PLATELET
BASOS ABS: 0.1 10*3/uL (ref 0.0–0.1)
Basophils Relative: 1 % (ref 0–1)
EOS ABS: 0.1 10*3/uL (ref 0.0–0.7)
Eosinophils Relative: 2 % (ref 0–5)
HEMATOCRIT: 39.2 % (ref 36.0–46.0)
HEMOGLOBIN: 13.2 g/dL (ref 12.0–15.0)
LYMPHS PCT: 40 % (ref 12–46)
Lymphs Abs: 2.3 10*3/uL (ref 0.7–4.0)
MCH: 27.9 pg (ref 26.0–34.0)
MCHC: 33.7 g/dL (ref 30.0–36.0)
MCV: 82.9 fL (ref 78.0–100.0)
MONOS PCT: 5 % (ref 3–12)
Monocytes Absolute: 0.3 10*3/uL (ref 0.1–1.0)
NEUTROS ABS: 3 10*3/uL (ref 1.7–7.7)
NEUTROS PCT: 52 % (ref 43–77)
Platelets: 137 10*3/uL — ABNORMAL LOW (ref 150–400)
RBC: 4.73 MIL/uL (ref 3.87–5.11)
RDW: 15.1 % (ref 11.5–15.5)
WBC: 5.8 10*3/uL (ref 4.0–10.5)

## 2015-04-12 LAB — COMPREHENSIVE METABOLIC PANEL
ALBUMIN: 3.4 g/dL — AB (ref 3.5–5.0)
ALT: 94 U/L — ABNORMAL HIGH (ref 14–54)
ANION GAP: 8 (ref 5–15)
AST: 69 U/L — ABNORMAL HIGH (ref 15–41)
Alkaline Phosphatase: 78 U/L (ref 38–126)
BILIRUBIN TOTAL: 0.4 mg/dL (ref 0.3–1.2)
BUN: <5 mg/dL — ABNORMAL LOW (ref 6–20)
CO2: 25 mmol/L (ref 22–32)
Calcium: 8.8 mg/dL — ABNORMAL LOW (ref 8.9–10.3)
Chloride: 105 mmol/L (ref 101–111)
Creatinine, Ser: 0.64 mg/dL (ref 0.44–1.00)
GFR calc non Af Amer: >60 mL/min (ref >60)
Glucose, Bld: 147 mg/dL — ABNORMAL HIGH (ref 65–99)
POTASSIUM: 3.7 mmol/L (ref 3.5–5.1)
Sodium: 138 mmol/L (ref 135–145)
TOTAL PROTEIN: 6.5 g/dL (ref 6.5–8.1)

## 2015-04-12 LAB — URINE MICROSCOPIC-ADD ON

## 2015-04-12 LAB — URINALYSIS, ROUTINE W REFLEX MICROSCOPIC
BILIRUBIN URINE: NEGATIVE
GLUCOSE, UA: NEGATIVE mg/dL
Hgb urine dipstick: NEGATIVE
KETONES UR: NEGATIVE mg/dL
Nitrite: NEGATIVE
PROTEIN: NEGATIVE mg/dL
Specific Gravity, Urine: 1.023 (ref 1.005–1.030)
Urobilinogen, UA: 1 mg/dL (ref 0.0–1.0)
pH: 6 (ref 5.0–8.0)

## 2015-04-12 LAB — POC URINE PREG, ED: Preg Test, Ur: NEGATIVE

## 2015-04-12 MED ORDER — CIPROFLOXACIN-DEXAMETHASONE 0.3-0.1 % OT SUSP
4.0000 [drp] | Freq: Once | OTIC | Status: AC
  Administered 2015-04-12: 4 [drp] via OTIC
  Filled 2015-04-12: qty 7.5

## 2015-04-12 MED ORDER — CLOTRIMAZOLE 1 % VA CREA: 1.0000 | TOPICAL_CREAM | Freq: Every day | VAGINAL | Status: AC

## 2015-04-12 MED ORDER — DEXAMETHASONE 4 MG PO TABS
10.0000 mg | ORAL_TABLET | Freq: Once | ORAL | Status: AC
  Administered 2015-04-12: 10 mg via ORAL
  Filled 2015-04-12: qty 3

## 2015-04-12 MED ORDER — CIPROFLOXACIN-DEXAMETHASONE 0.3-0.1 % OT SUSP: 4.0000 [drp] | Freq: Two times a day (BID) | OTIC | Status: AC

## 2015-04-12 MED ORDER — CEPHALEXIN 250 MG PO CAPS
500.0000 mg | ORAL_CAPSULE | Freq: Once | ORAL | Status: AC
  Administered 2015-04-12: 500 mg via ORAL
  Filled 2015-04-12: qty 2

## 2015-04-12 MED ORDER — CEPHALEXIN 500 MG PO CAPS: 500.0000 mg | ORAL_CAPSULE | Freq: Four times a day (QID) | ORAL | Status: AC

## 2015-04-12 NOTE — ED Provider Notes (Signed)
CSN: [id number]     Arrival date & time 04/12/15  4098 History   First MD Initiated Contact with Patient 04/12/15 1039     Chief Complaint  Patient presents with  . Abdominal Pain  . Sore Throat  . Ear Fullness     (Consider location/radiation/quality/duration/timing/severity/associated sxs/prior Treatment) HPI  29 year old female with multiple complaints however the most concerning to her is she has 1 day of right ear fullness and pain with some discharge. Also sore throat, subjective fevers and nausea. She had some cough. She had similar symptoms in the past with ear infections. Review of systems is largely positive for dysuria, shortness of breath, cough, abdominal pain from her hernia. He has no diarrhea, constipation, vomiting or measured fevers.  Past Medical History  Diagnosis Date  . Abnormal Pap smear   . Gestational diabetes     all pregnancies glyburide started 11/4  . Thrombocytopenia   . Headache(784.0)   . Vaginal Pap smear, abnormal   . Hernia, umbilical    Past Surgical History  Procedure Laterality Date  . Wisdom tooth extraction    . Umbilical hernia repair N/A 11/22/2014    Procedure: LAPAROSCOPIC UMBILICAL HERNIA;  Surgeon: Axel Filler, MD;  Location: [facility] OR;  Service: General;  Laterality: N/A;   Family History  Problem Relation Age of Onset  . Hypertension Father   . Diabetes Father   . Diabetes Mother   . Seizures Brother    Social History  Substance Use Topics  . Smoking status: Never Smoker   . Smokeless tobacco: Never Used  . Alcohol Use: No   OB History    Gravida Para Term Preterm AB TAB SAB Ectopic Multiple Living   Review of Systems  Constitutional: Positive for fever. Negative for chills and appetite change.  HENT: Positive for sore throat. Negative for congestion, drooling, ear pain and voice change.   Eyes: Negative for pain and itching.  Respiratory: Positive for cough and shortness of breath. Negative for  chest tightness and wheezing.   Cardiovascular: Negative for chest pain, palpitations and leg swelling.  Gastrointestinal: Positive for nausea and abdominal pain. Negative for vomiting and constipation.  Endocrine: Negative for polydipsia and polyuria.  Musculoskeletal: Negative for back pain, gait problem and neck pain.  All other systems reviewed and are negative.     Allergies  Penicillins and Latex  Home Medications   Prior to Admission medications   Medication Sig Start Date End Date Taking? Authorizing Provider  azithromycin (ZITHROMAX) 250 MG tablet Take 1 tablet (250 mg total) by mouth daily. Take first 2 tablets together, then 1 every day until finished. Patient not taking: Reported on 03/25/2015 01/12/15   Marissa Sciacca, PA-C  metroNIDAZOLE (FLAGYL) 500 MG tablet Take 1 tablet (500 mg total) by mouth 2 (two) times daily. Patient not taking: Reported on 03/25/2015 01/12/15   Marissa Sciacca, PA-C  oxyCODONE-acetaminophen (PERCOCET) 10-325 MG per tablet Take 1 tablet by mouth 3 (three) times daily as needed for pain.  03/16/15   Historical Provider, MD  oxyCODONE-acetaminophen (ROXICET) 5-325 MG per tablet Take 1-2 tablets by mouth every 4 (four) hours as needed. Patient not taking: Reported on 03/25/2015 11/22/14   Axel Filler, MD   BP 125/89 mmHg  Pulse 77  Temp(Src) 99 F (37.2 C) (Oral)  Resp 18  Ht  (1.676 m)  SpO2 100%  LMP 03/10/2015 Physical Exam  Constitutional: She  is oriented to person, place, and time. She appears well-developed and well-nourished.  HENT:  Head: Normocephalic and atraumatic.  Right Ear: There is swelling. Tympanic membrane is injected. A middle ear effusion is present.  Left Ear: No drainage, swelling or tenderness. Tympanic membrane is not injected. A middle ear effusion is present.  Eyes: Conjunctivae and EOM are normal. Right eye exhibits no discharge. Left eye exhibits no discharge.  Cardiovascular: Normal rate and regular rhythm.    Pulmonary/Chest: Effort normal and breath sounds normal. No respiratory distress.  Abdominal: Soft. Bowel sounds are normal. She exhibits no distension. There is no tenderness. There is no rebound.  Musculoskeletal: Normal range of motion. She exhibits no edema or tenderness.  Neurological: She is alert and oriented to person, place, and time.  Skin: Skin is warm and dry.  Nursing note and vitals reviewed.   ED Course  Procedures (including critical care time) Labs Review Labs Reviewed  URINALYSIS, ROUTINE W REFLEX MICROSCOPIC (NOT AT [facility]) - Abnormal; Notable for the following:    APPearance CLOUDY (*)    Leukocytes, UA MODERATE (*)    All other components within normal limits  COMPREHENSIVE METABOLIC PANEL - Abnormal; Notable for the following:    Glucose, Bld 147 (*)    BUN <5 (*)    Calcium 8.8 (*)    Albumin 3.4 (*)    AST 69 (*)    ALT 94 (*)    All other components within normal limits  CBC WITH DIFFERENTIAL/PLATELET - Abnormal; Notable for the following:    Platelets 137 (*)    All other components within normal limits  URINE MICROSCOPIC-ADD ON - Abnormal; Notable for the following:    Squamous Epithelial / LPF MANY (*)    All other components within normal limits  POC URINE PREG, ED    Imaging Review No results found. I have personally reviewed and evaluated these images and lab results as part of my medical decision-making.   EKG Interpretation None      MDM   Final diagnoses:  Otitis externa, right  Right otitis media, recurrence not specified, unspecified chronicity, unspecified otitis media type  UTI (lower urinary tract infection)   Right middle hear with otitis externa and possibly early bacterial AOM. Will treat with drops and po abx. Also with likely viral pharyngitis, will give decadron. Abdomen benign, doubt obstruction, incarcerated hernia or other significant pathology.   I have personally and contemperaneously reviewed labs and imaging and  used in my decision making as above.   A medical screening exam was performed and I feel the patient has had an appropriate workup for their chief complaint at this time and likelihood of emergent condition existing is low. They have been counseled on decision, discharge, follow up and which symptoms necessitate immediate return to the emergency department. They or their family verbally stated understanding and agreement with plan and discharged in stable condition.      Marily Memos, MD 04/12/15 1728

## 2015-04-12 NOTE — Discharge Instructions (Signed)
°Emergency Department Resource Guide °1) Find a Doctor and Pay Out of Pocket °Although you won't have to find out who is covered by your insurance plan, it is a good idea to ask around and get recommendations. You will then need to call the office and see if the doctor you have chosen will accept you as a new patient and what types of options they offer for patients who are self-pay. Some doctors offer discounts or will set up payment plans for their patients who do not have insurance, but you will need to ask so you aren't surprised when you get to your appointment. ° °2) Contact Your Local Health Department °Not all health departments have doctors that can see patients for sick visits, but many do, so it is worth a call to see if yours does. If you don't know where your local health department is, you can check in your phone book. The CDC also has a tool to help you locate your state's health department, and many state websites also have listings of all of their local health departments. ° °3) Find a Walk-in Clinic °If your illness is not likely to be very severe or complicated, you may want to try a walk in clinic. These are popping up all over the country in pharmacies, drugstores, and shopping centers. They're usually staffed by nurse practitioners or physician assistants that have been trained to treat common illnesses and complaints. They're usually fairly quick and inexpensive. However, if you have serious medical issues or chronic medical problems, these are probably not your best option. ° °No Primary Care Doctor: °- Call [facility] at  832-8000 - they can help you locate a primary care doctor that  accepts your insurance, provides certain services, etc. °- Physician Referral Service- 1-800-533-3463 ° °Chronic Pain Problems: °Organization         Address  Phone   Notes  °[facility]  (336) 297-2271 Patients need to be referred by their primary care doctor.  ° °Medication  Assistance: °Organization         Address  Phone   Notes  °[place] [facility] [address] °[place], [place] [address] (336) 641-8030 --Must be a resident of [place] °-- Must have NO insurance coverage whatsoever (no Medicaid/ Medicare, etc.) °-- The pt. MUST have a primary care doctor that directs their care regularly and follows them in the community °  °[facility]  (866) 331-1348   °[employer]  (888) 892-1162   ° °Agencies that provide inexpensive medical care: °Organization         Address  Phone   Notes  °[facility]  (336) 832-8035   °[facility]    (336) 832-7272   °[facility] [address] °[place], [place] [address] (336) 832-4777   °[facility] [address], °[place] (336) 271-4999   °[facility]    (336) 373-0678   °[facility]    (336) 272-1050   °[facility] ° [address], [place] Phone:  (336) 832-4444, Fax:  (336) 832-4440 Hours of Operation:  9 am - 6 pm, M-F.  Also accepts Medicaid/Medicare and self-pay.  °[facility] ° [address], [place] Phone: (336) 832-3150, Fax: (336) 832-3151. Hours of Operation:  8:30 am - 5:30 pm, M-F.  Also accepts Medicaid and self-pay.  °[facility] [place] [address], [place] Phone: (336) 878-6027   °[facility] [address], [place], [place] (336)723-1848, Ext. 123 Mondays & Thursdays: 7-9 AM.  First 15 patients are seen on a first come, first serve basis. °  ° °Medicaid-accepting [place] Providers: ° °Organization         Address  Phone   Notes  °[facility] [address], [place] (336) 641-2100 Also accepts self-pay patients.  °[facility] [address], [place] ° (336) 856-9996   °[facility] [address], [place]  (336) 288-8857   °[facility] [address] [place] [address], [place] (336) 299-7000   °Veita Bland [address], [place]  ° (336) 373-1557 Only accepts [place] Access Medicaid patients after they have their name applied to their card.  ° °Self-Pay (no insurance) in [place]: ° °Organization         Address  Phone   Notes  °Sickle Cell Patients, [facility] [address], [place] (336) 832-1970   °[facility] [address], [place] (336) 832-4400   °[facility] ° [address], [place] (336) 992-4800   °[facility]/Dr. Osei-Bonsu ° [address] [place] [address], [place] or [address], [place] (336) 841-8500 Phone number for both [place] and [place] locations is the same.  °[facility] [address], [place] (336) 299-0000   °[facility] [address] [place] [address], [place] or [address] (336) 852-7530 °(336) 878-2260   °[facility] [address], [place] (336) 350-1642, phone; (336) 294-5005, fax Sees patients 1st and 3rd Saturday of every month.  Must not qualify for public or private insurance (i.e. Medicaid, Medicare, [place] Health Choice, Veterans' Benefits) • Household income should be no more than 200% of the poverty level •The clinic cannot treat you if you are pregnant or think you are pregnant • Sexually transmitted diseases are not treated at the clinic.  ° ° °Dental Care: °Organization         Address  Phone  Notes  °[place] [facility] [address], [place] (336) 641-6152 Accepts children up to age 21 who are enrolled in Medicaid or [place] Health Choice; pregnant women with a Medicaid card; and children who have applied for Medicaid or [place] Health Choice, but were declined, whose parents can pay a reduced fee at time of service.  °[place]  [facility] [place]  [address], [place] (336) 641-7733 Accepts children up to age 21 who are enrolled in Medicaid or [place] Health Choice; pregnant women with a Medicaid card; and children who have applied for Medicaid or [place] Health Choice, but were declined, whose parents can pay a reduced fee at time of service.  °[facility] ° [address],  (336) 641-4533 Patients are seen by appointment only. Walk-ins are not accepted. [facility] will see patients 18 years of age and older. °Monday - Tuesday (8am-5pm) °Most Wednesdays (8:30-5pm) °$30 per visit, cash only  °[facility] ° [address], [place] (336) 641-4533 Patients are seen by appointment only. Walk-ins are not accepted. [facility] will see patients 18 years of age and older. °One   Wednesday Evening (Monthly: Volunteer Based).  $30 per visit, cash only  °[facility]  (919) 537-3737 for adults; Children under age 4, call Graduate Pediatric Dentistry at (919) [phone]. Children aged 4-14, please call (919) [phone] to request a pediatric application. ° Dental services are provided in all areas of dental care including fillings, crowns and bridges, complete and partial dentures, implants, gum treatment, root canals, and extractions. Preventive care is also provided. Treatment is provided to both adults and children. °Patients are selected via a lottery and there is often a waiting list. °  °[facility] [address], °[place] ° (336) 763-8833 [email or web address] °  °[facility] [address], [place], [place] (336)723-1848, Ext. 123 Second and Fourth Thursday of each month, opens at 6:30 AM; Clinic ends at 9 AM.  Patients are seen on a first-come first-served basis, and a limited number are seen during each clinic.  ° °[facility] ° [address], [place], [place] (336) 723-7904    Eligibility Requirements °You must have lived in [place], or [place] counties for at least the last three months. °  You cannot be eligible for state or federal sponsored healthcare insurance, including Veterans Administration, Medicaid, or Medicare. °  You generally cannot be eligible for healthcare insurance through your employer.  °  How to apply: °Eligibility screenings are held every Tuesday and Wednesday afternoon from 1:00 pm until 4:00 pm. You do not need an appointment for the interview!  °[facility] [address], [place], [place] 336-631-2330   °[facility]  336-342-8273   °[facility]  336-703-3100   °[facility]  336-570-6415   ° °Behavioral Health Resources in the Community: °Intensive Outpatient Programs °Organization         Address  Phone  Notes  °[facility] [address], [place], [place] 336-878-6098   °[facility] [address], [place], [place] 336-832-9800   °[facility] [address], [place], [place] ° 336-882-2125   °[facility] [address],  °[place], [place] 1-800-853-5163 or 336-641-4981   °Substance Abuse Resources °Organization         Address  Phone  Notes  °[facility]  336-882-2125   °[facility]  336-784-9470   °[facility]  336-285-9073   °[facility]  336-845-3988   °Residential & Outpatient Substance Abuse Program  1-800-659-3381   °Psychological Services °Organization         Address  Phone  Notes  °[facility]  336- 832-9600   °[facility]  336- 378-7881   °[facility] [address], [place] 1-800-853-5163 or 336-641-4981   ° °Mobile Crisis Teams °Organization         Address  Phone  Notes  °[facility], Mobile Crisis Care Unit  1-877-626-1772   °Assertive °[facility] ° [address]  [place], [place] 336-834-9664   °Sharon DeEsch [address] °[place] 336-554-5454   ° °Self-Help/Support Groups °Organization         Address  Phone             Notes  °[facility] [place] [facility] [place] - variety of support groups  336- 373-1402 Call for more information  °Narcotics Anonymous (NA), [facility] [address], °[place]  2 meetings at this location  ° °  Residential Treatment Programs °Organization         Address  Phone  Notes  °[facility] [address],    °[place]  1-866-801-8205   °[facility] ° [address], [place], [place] 704-293-8524   °[facility] [address], [place] 336-845-3988 Admissions: 8am-3pm M-F  °[facility] [address]    °[place], [place] 336-841-1104   °[facility] [address], [place], [place] 336-379-7146   °[facility] [address]  °[place], [place] 336-285-9073   °[facility] Intensive Outpatient [address], [place], [place] 336-852-3033   °[facility] [address]  °[place], [place] 1-877-615-2722 or 336-784-9470   °[facility] [address] [place], [place] 336-227-7417 Accepts Medicaid  °[facility] [address]  °[place] 1-800-659-3381 Substance Abuse/Addiction Treatment  ° °[place] Behavioral Health Resources °Organization         Address  Phone  Notes  °[facility]  (888) 581-9988   °Julie Brannon, PhD [address] [place], [place]   (336) 349-5553 or (336) 951-0000   °[facility]   [address] °[place], [place] (336) 349-4454   °[facility] [address], [place], [place] (336) 342-8316 Insurance/Medicaid/sponsorship through [facility]  °[facility] [address]                                    [place], [place] (336) 342-8316 Therapy/tele-psych/case    °[facility] [address]  ° [place], [place] (336) 349-2233    °Dr. Arfeen  (336) 349-4544   °[facility] [place]  [employer] [place] [facility] 1) [address], [place] °2) [address], [place] °3)  [address], [place] (336) 349-3220 °(336) 342-7768 ° °(336) 342-8140   °[place] [facility] (336) 342-1394 or (336) 342-3537 (After Hours)    ° ° °

## 2015-04-12 NOTE — ED Notes (Signed)
Pt here from home with c/o sore throat and right ear fullness , pt also c/o lower abd pain , pt does have burning upon urination , no bleeding or discharge noted

## 2015-05-16 ENCOUNTER — Emergency Department (HOSPITAL_COMMUNITY): Payer: Medicaid Other

## 2015-05-16 ENCOUNTER — Encounter (HOSPITAL_COMMUNITY): Payer: Self-pay | Admitting: Emergency Medicine

## 2015-05-16 ENCOUNTER — Emergency Department (HOSPITAL_COMMUNITY)
Admission: EM | Admit: 2015-05-16 | Discharge: 2015-05-16 | Disposition: A | Discharge status: Home / Self Care | Payer: Medicaid Other | Attending: Emergency Medicine | Admitting: Emergency Medicine

## 2015-05-16 DIAGNOSIS — S161XXA Strain of muscle, fascia and tendon at neck level, initial encounter: Secondary | ICD-10-CM | POA: Diagnosis not present

## 2015-05-16 DIAGNOSIS — Y9389 Activity, other specified: Secondary | ICD-10-CM | POA: Diagnosis not present

## 2015-05-16 DIAGNOSIS — Y92410 Unspecified street and highway as the place of occurrence of the external cause: Secondary | ICD-10-CM | POA: Diagnosis not present

## 2015-05-16 DIAGNOSIS — Z862 Personal history of diseases of the blood and blood-forming organs and certain disorders involving the immune mechanism: Secondary | ICD-10-CM | POA: Diagnosis not present

## 2015-05-16 DIAGNOSIS — M62830 Muscle spasm of back: Secondary | ICD-10-CM | POA: Insufficient documentation

## 2015-05-16 DIAGNOSIS — S29092A Other injury of muscle and tendon of back wall of thorax, initial encounter: Secondary | ICD-10-CM | POA: Diagnosis not present

## 2015-05-16 DIAGNOSIS — S199XXA Unspecified injury of neck, initial encounter: Secondary | ICD-10-CM | POA: Diagnosis present

## 2015-05-16 DIAGNOSIS — Z8632 Personal history of gestational diabetes: Secondary | ICD-10-CM | POA: Insufficient documentation

## 2015-05-16 DIAGNOSIS — Z8719 Personal history of other diseases of the digestive system: Secondary | ICD-10-CM | POA: Diagnosis not present

## 2015-05-16 DIAGNOSIS — Z880 Allergy status to penicillin: Secondary | ICD-10-CM | POA: Diagnosis not present

## 2015-05-16 DIAGNOSIS — Z91040 Latex allergy status: Secondary | ICD-10-CM | POA: Insufficient documentation

## 2015-05-16 DIAGNOSIS — Z3202 Encounter for pregnancy test, result negative: Secondary | ICD-10-CM | POA: Diagnosis not present

## 2015-05-16 DIAGNOSIS — Y998 Other external cause status: Secondary | ICD-10-CM | POA: Diagnosis not present

## 2015-05-16 DIAGNOSIS — R110 Nausea: Secondary | ICD-10-CM | POA: Diagnosis not present

## 2015-05-16 LAB — I-STAT BETA HCG BLOOD, ED (MC, WL, AP ONLY): I-stat hCG, quantitative: <5 m[IU]/mL (ref <5)

## 2015-05-16 MED ORDER — TRAMADOL HCL 50 MG PO TABS: 50.0000 mg | ORAL_TABLET | Freq: Four times a day (QID) | ORAL | Status: DC | PRN

## 2015-05-16 MED ORDER — CYCLOBENZAPRINE HCL 10 MG PO TABS
10.0000 mg | ORAL_TABLET | Freq: Once | ORAL | Status: AC
  Administered 2015-05-16: 10 mg via ORAL
  Filled 2015-05-16: qty 1

## 2015-05-16 MED ORDER — CYCLOBENZAPRINE HCL 10 MG PO TABS: 10.0000 mg | ORAL_TABLET | Freq: Two times a day (BID) | ORAL | Status: DC | PRN

## 2015-05-16 MED ORDER — NAPROXEN 500 MG PO TABS: 500.0000 mg | ORAL_TABLET | Freq: Two times a day (BID) | ORAL | Status: DC

## 2015-05-16 MED ORDER — HYDROCODONE-ACETAMINOPHEN 5-325 MG PO TABS
1.0000 | ORAL_TABLET | Freq: Once | ORAL | Status: AC
  Administered 2015-05-16: 1 via ORAL
  Filled 2015-05-16: qty 1
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
## 2015-05-16 NOTE — ED Notes (Signed)
Declined W/C at D/C and was escorted to lobby by RN. 

## 2015-05-16 NOTE — ED Notes (Signed)
At time of D/C Pt refused Ultram  And Naproxen because they do not help pain. Pt stated " I want to know why they can not give me some vics. I will go see my doctor."

## 2015-06-30 ENCOUNTER — Emergency Department (HOSPITAL_COMMUNITY)
Admission: EM | Admit: 2015-06-30 | Discharge: 2015-06-30 | Disposition: A | Discharge status: Home / Self Care | Payer: Medicaid Other | Attending: Emergency Medicine | Admitting: Emergency Medicine

## 2015-06-30 ENCOUNTER — Encounter (HOSPITAL_COMMUNITY): Payer: Self-pay | Admitting: *Deleted

## 2015-06-30 ENCOUNTER — Emergency Department (HOSPITAL_COMMUNITY): Payer: Medicaid Other

## 2015-06-30 DIAGNOSIS — Z791 Long term (current) use of non-steroidal anti-inflammatories (NSAID): Secondary | ICD-10-CM | POA: Diagnosis not present

## 2015-06-30 DIAGNOSIS — R1030 Lower abdominal pain, unspecified: Secondary | ICD-10-CM | POA: Insufficient documentation

## 2015-06-30 DIAGNOSIS — O9989 Other specified diseases and conditions complicating pregnancy, childbirth and the puerperium: Secondary | ICD-10-CM | POA: Diagnosis not present

## 2015-06-30 DIAGNOSIS — Z91040 Latex allergy status: Secondary | ICD-10-CM | POA: Insufficient documentation

## 2015-06-30 DIAGNOSIS — O24414 Gestational diabetes mellitus in pregnancy, insulin controlled: Secondary | ICD-10-CM | POA: Insufficient documentation

## 2015-06-30 DIAGNOSIS — Z880 Allergy status to penicillin: Secondary | ICD-10-CM | POA: Insufficient documentation

## 2015-06-30 DIAGNOSIS — Z3A09 9 weeks gestation of pregnancy: Secondary | ICD-10-CM | POA: Insufficient documentation

## 2015-06-30 DIAGNOSIS — O26899 Other specified pregnancy related conditions, unspecified trimester: Secondary | ICD-10-CM

## 2015-06-30 DIAGNOSIS — Z862 Personal history of diseases of the blood and blood-forming organs and certain disorders involving the immune mechanism: Secondary | ICD-10-CM | POA: Diagnosis not present

## 2015-06-30 DIAGNOSIS — R102 Pelvic and perineal pain: Secondary | ICD-10-CM

## 2015-06-30 DIAGNOSIS — Z3490 Encounter for supervision of normal pregnancy, unspecified, unspecified trimester: Secondary | ICD-10-CM

## 2015-06-30 LAB — COMPREHENSIVE METABOLIC PANEL
ALT: 16 U/L (ref 14–54)
AST: 22 U/L (ref 15–41)
Albumin: 3.1 g/dL — ABNORMAL LOW (ref 3.5–5.0)
Alkaline Phosphatase: 59 U/L (ref 38–126)
Anion gap: 9 (ref 5–15)
BILIRUBIN TOTAL: 0.3 mg/dL (ref 0.3–1.2)
BUN: 7 mg/dL (ref 6–20)
CHLORIDE: 100 mmol/L — AB (ref 101–111)
CO2: 25 mmol/L (ref 22–32)
CREATININE: 0.67 mg/dL (ref 0.44–1.00)
Calcium: 8.9 mg/dL (ref 8.9–10.3)
Glucose, Bld: 248 mg/dL — ABNORMAL HIGH (ref 65–99)
POTASSIUM: 3.5 mmol/L (ref 3.5–5.1)
Sodium: 134 mmol/L — ABNORMAL LOW (ref 135–145)
TOTAL PROTEIN: 6.8 g/dL (ref 6.5–8.1)

## 2015-06-30 LAB — URINALYSIS, ROUTINE W REFLEX MICROSCOPIC
BILIRUBIN URINE: NEGATIVE
Glucose, UA: NEGATIVE mg/dL
Hgb urine dipstick: NEGATIVE
KETONES UR: NEGATIVE mg/dL
NITRITE: NEGATIVE
PH: 6 (ref 5.0–8.0)
PROTEIN: NEGATIVE mg/dL
Specific Gravity, Urine: 1.03 (ref 1.005–1.030)
UROBILINOGEN UA: 1 mg/dL (ref 0.0–1.0)

## 2015-06-30 LAB — CBC
HCT: 40.1 % (ref 36.0–46.0)
Hemoglobin: 13.6 g/dL (ref 12.0–15.0)
MCH: 28.1 pg (ref 26.0–34.0)
MCHC: 33.9 g/dL (ref 30.0–36.0)
MCV: 82.9 fL (ref 78.0–100.0)
PLATELETS: 154 10*3/uL (ref 150–400)
RBC: 4.84 MIL/uL (ref 3.87–5.11)
RDW: 14.6 % (ref 11.5–15.5)
WBC: 6.8 10*3/uL (ref 4.0–10.5)

## 2015-06-30 LAB — URINE MICROSCOPIC-ADD ON

## 2015-06-30 LAB — WET PREP, GENITAL
Clue Cells Wet Prep HPF POC: NONE SEEN
Trich, Wet Prep: NONE SEEN
Yeast Wet Prep HPF POC: NONE SEEN

## 2015-06-30 LAB — I-STAT BETA HCG BLOOD, ED (MC, WL, AP ONLY)

## 2015-06-30 LAB — LIPASE, BLOOD: LIPASE: 24 U/L (ref 11–51)

## 2015-06-30 MED ORDER — NITROFURANTOIN MONOHYD MACRO 100 MG PO CAPS: 100.0000 mg | ORAL_CAPSULE | Freq: Two times a day (BID) | ORAL | Status: DC

## 2015-06-30 MED ORDER — IBUPROFEN 400 MG PO TABS
600.0000 mg | ORAL_TABLET | Freq: Once | ORAL | Status: AC
  Administered 2015-06-30: 600 mg via ORAL
  Filled 2015-06-30 (×2): qty 1

## 2015-06-30 NOTE — ED Notes (Signed)
PT ambulated with baseline gait; VSS; A&Ox3; no signs of distress; respirations even and unlabored; skin warm and dry; no questions upon discharge.  

## 2015-06-30 NOTE — ED Notes (Signed)
Pt reports abdominal pain and diarrhea for several days. Denies N/V.

## 2015-06-30 NOTE — ED Provider Notes (Signed)
CSN: [id number]     Arrival date & time 06/30/15  0906 History   First MD Initiated Contact with Patient 06/30/15 [phone]     Chief Complaint  Patient presents with  . Pelvic Pain     (Consider location/radiation/quality/duration/timing/severity/associated sxs/prior Treatment) Patient is a 29 y.o. female presenting with pelvic pain.  Pelvic Pain This is a new problem. The current episode started 2 days ago. The problem occurs constantly. The problem has not changed since onset.Associated symptoms include abdominal pain. Pertinent negatives include no chest pain, no headaches and no shortness of breath. The symptoms are aggravated by bending. Nothing relieves the symptoms. She has tried nothing for the symptoms.    Past Medical History  Diagnosis Date  . Abnormal Pap smear   . Gestational diabetes     all pregnancies glyburide started 11/4  . Thrombocytopenia (HCC)   . Headache(784.0)   . Vaginal Pap smear, abnormal   . Hernia, umbilical    Past Surgical History  Procedure Laterality Date  . Wisdom tooth extraction    . Umbilical hernia repair N/A 11/22/2014    Procedure: LAPAROSCOPIC UMBILICAL HERNIA;  Surgeon: Axel Filler, MD;  Location: [facility] OR;  Service: General;  Laterality: N/A;   Family History  Problem Relation Age of Onset  . Hypertension Father   . Diabetes Father   . Diabetes Mother   . Seizures Brother    Social History  Substance Use Topics  . Smoking status: Never Smoker   . Smokeless tobacco: Never Used  . Alcohol Use: No   OB History    Gravida Para Term Preterm AB TAB SAB Ectopic Multiple Living   Review of Systems  Respiratory: Negative for shortness of breath.   Cardiovascular: Negative for chest pain.  Gastrointestinal: Positive for abdominal pain.  Genitourinary: Positive for pelvic pain.  Neurological: Negative for headaches.  All other systems reviewed and are negative.     Allergies  Penicillins and Latex  Home  Medications   Prior to Admission medications   Medication Sig Start Date End Date Taking? Authorizing Provider  cyclobenzaprine (FLEXERIL) 10 MG tablet Take 1 tablet (10 mg total) by mouth 2 (two) times daily as needed for muscle spasms. 05/16/15   Hope Orlene Och, NP  naproxen (NAPROSYN) 500 MG tablet Take 1 tablet (500 mg total) by mouth 2 (two) times daily. 05/16/15   Hope Orlene Och, NP  nitrofurantoin, macrocrystal-monohydrate, (MACROBID) 100 MG capsule Take 1 capsule (100 mg total) by mouth 2 (two) times daily. X 7 days 06/30/15   Mirian Mo, MD  oxyCODONE-acetaminophen (PERCOCET) 10-325 MG per tablet Take 1 tablet by mouth 3 (three) times daily as needed for pain.  03/16/15   Historical Provider, MD  traMADol (ULTRAM) 50 MG tablet Take 1 tablet (50 mg total) by mouth every 6 (six) hours as needed. 05/16/15   Hope Orlene Och, NP   BP 102/59 mmHg  Pulse 85  Temp(Src) 97.9 F (36.6 C) (Oral)  Resp 18  SpO2 100% Physical Exam  Constitutional: She is oriented to person, place, and time. She appears well-developed and well-nourished.  HENT:  Head: Normocephalic and atraumatic.  Right Ear: External ear normal.  Left Ear: External ear normal.  Eyes: Conjunctivae and EOM are normal. Pupils are equal, round, and reactive to light.  Neck: Normal range of motion. Neck supple.  Cardiovascular: Normal rate, regular rhythm, normal heart sounds and intact distal  pulses.   Pulmonary/Chest: Effort normal and breath sounds normal.  Abdominal: Soft. Bowel sounds are normal. There is tenderness in the suprapubic area.  Genitourinary: Cervix exhibits discharge (mild white). Cervix exhibits no motion tenderness and no friability.  Musculoskeletal: Normal range of motion.  Neurological: She is alert and oriented to person, place, and time.  Skin: Skin is warm and dry.  Vitals reviewed.   ED Course  Procedures (including critical care time) Labs Review Labs Reviewed  WET PREP, GENITAL - Abnormal; Notable  for the following:    WBC, Wet Prep HPF POC FEW (*)    All other components within normal limits  COMPREHENSIVE METABOLIC PANEL - Abnormal; Notable for the following:    Sodium 134 (*)    Chloride 100 (*)    Glucose, Bld 248 (*)    Albumin 3.1 (*)    All other components within normal limits  URINALYSIS, ROUTINE W REFLEX MICROSCOPIC (NOT AT [facility]) - Abnormal; Notable for the following:    APPearance CLOUDY (*)    Leukocytes, UA TRACE (*)    All other components within normal limits  URINE MICROSCOPIC-ADD ON - Abnormal; Notable for the following:    Squamous Epithelial / LPF MANY (*)    Bacteria, UA MANY (*)    All other components within normal limits  I-STAT BETA HCG BLOOD, ED ([facility], [facility], [facility] ONLY) - Abnormal; Notable for the following:    I-stat hCG, quantitative >2000.0 (*)    All other components within normal limits  LIPASE, BLOOD  CBC  GC/CHLAMYDIA PROBE AMP ([place]) NOT AT [facility]    Imaging Review [place] Ob Comp Less 14 Wks  06/30/2015  CLINICAL DATA:  Two week history of right lower quadrant and pelvic pain EXAM: OBSTETRIC <14 WK US AND TRANSVAGINAL OB US TECHNIQUE: Both transabdominal and transvaginal ultrasound examinations were performed for complete evaluation of the gestation as well as the maternal uterus, adnexal regions, and pelvic cul-de-sac. Transvaginal technique was performed to assess early pregnancy. COMPARISON:  None. FINDINGS: Intrauterine gestational sac: Visualized/normal in shape. Yolk sac:  Visualized Embryo:  Visualized Cardiac Activity: Visualized Heart Rate: 168  bpm CRL:  25  mm   9 w   2 d                  US EDC: January 31, 2016 Maternal uterus/adnexae: There is a subchorionic hemorrhage measuring 2.3 x 1.6 cm. Cervical os is closed. There are no extrauterine maternal pelvic or adnexal masses. No maternal free pelvic fluid. IMPRESSION: Single live intrauterine gestation with estimated gestational age of 9+ weeks. Subchorionic hemorrhage present. No maternal  extrauterine pelvic mass or free fluid. Electronically Signed   By: Bretta BangWilliam  Woodruff III M.D.   On: 06/30/2015 11:21   [place] Ob Transvaginal  06/30/2015  CLINICAL DATA:  Two week history of right lower quadrant and pelvic pain EXAM: OBSTETRIC <14 WK US AND TRANSVAGINAL OB US TECHNIQUE: Both transabdominal and transvaginal ultrasound examinations were performed for complete evaluation of the gestation as well as the maternal uterus, adnexal regions, and pelvic cul-de-sac. Transvaginal technique was performed to assess early pregnancy. COMPARISON:  None. FINDINGS: Intrauterine gestational sac: Visualized/normal in shape. Yolk sac:  Visualized Embryo:  Visualized Cardiac Activity: Visualized Heart Rate: 168  bpm CRL:  25  mm   9 w   2 d                  US EDC: January 31, 2016 Maternal uterus/adnexae: There  is a subchorionic hemorrhage measuring 2.3 x 1.6 cm. Cervical os is closed. There are no extrauterine maternal pelvic or adnexal masses. No maternal free pelvic fluid. IMPRESSION: Single live intrauterine gestation with estimated gestational age of 9+ weeks. Subchorionic hemorrhage present. No maternal extrauterine pelvic mass or free fluid. Electronically Signed   By: Bretta Bang III M.D.   On: 06/30/2015 11:21   I have personally reviewed and evaluated these images and lab results as part of my medical decision-making.   EKG Interpretation None      MDM   Final diagnoses:  Lower abdominal pain  Pregnancy    29 y.o. female with pertinent PMH of obesity presents with mild abd pain as above.  No dysuria.  Mild vaginal dc.  Physical exam as above.  Wu with new pregnancy.  IUP, viable.  DC home with macrobid for asymptomatic bacteriuria.    I have reviewed all laboratory and imaging studies if ordered as above  1. Lower abdominal pain   2. Pelvic pain during pregnancy   3. Pregnancy         Mirian Mo, MD 06/30/15 1239

## 2015-06-30 NOTE — ED Notes (Signed)
MD at bedside to discuss pregnacy with patient

## 2015-06-30 NOTE — Discharge Instructions (Signed)

## 2015-06-30 NOTE — ED Notes (Signed)
Patient transported to Ultrasound 

## 2015-07-01 LAB — GC/CHLAMYDIA PROBE AMP (~~LOC~~) NOT AT ARMC
Chlamydia: NEGATIVE
Neisseria Gonorrhea: NEGATIVE

## 2015-07-21 ENCOUNTER — Inpatient Hospital Stay (HOSPITAL_COMMUNITY)
Admission: AD | Admit: 2015-07-21 | Discharge: 2015-07-21 | Disposition: A | Discharge status: Home / Self Care | Payer: Medicaid Other | Source: Ambulatory Visit | Attending: Obstetrics & Gynecology | Admitting: Obstetrics & Gynecology

## 2015-07-21 ENCOUNTER — Encounter (HOSPITAL_COMMUNITY): Payer: Self-pay | Admitting: *Deleted

## 2015-07-21 DIAGNOSIS — G43009 Migraine without aura, not intractable, without status migrainosus: Secondary | ICD-10-CM | POA: Diagnosis not present

## 2015-07-21 DIAGNOSIS — Z3A12 12 weeks gestation of pregnancy: Secondary | ICD-10-CM | POA: Diagnosis not present

## 2015-07-21 DIAGNOSIS — O26891 Other specified pregnancy related conditions, first trimester: Secondary | ICD-10-CM | POA: Insufficient documentation

## 2015-07-21 LAB — URINALYSIS, ROUTINE W REFLEX MICROSCOPIC
BILIRUBIN URINE: NEGATIVE
GLUCOSE, UA: NEGATIVE mg/dL
HGB URINE DIPSTICK: NEGATIVE
KETONES UR: NEGATIVE mg/dL
Leukocytes, UA: NEGATIVE
Nitrite: NEGATIVE
PH: 6 (ref 5.0–8.0)
Protein, ur: 30 mg/dL — AB

## 2015-07-21 LAB — URINE MICROSCOPIC-ADD ON: RBC / HPF: NONE SEEN RBC/hpf (ref 0–5)

## 2015-07-21 LAB — WET PREP, GENITAL
Clue Cells Wet Prep HPF POC: NONE SEEN
SPERM: NONE SEEN
TRICH WET PREP: NONE SEEN
Yeast Wet Prep HPF POC: NONE SEEN

## 2015-07-21 MED ORDER — BUTALBITAL-APAP-CAFFEINE 50-325-40 MG PO TABS: 1.0000 | ORAL_TABLET | Freq: Four times a day (QID) | ORAL | Status: AC | PRN

## 2015-07-21 MED ORDER — DEXAMETHASONE SODIUM PHOSPHATE 10 MG/ML IJ SOLN
10.0000 mg | Freq: Once | INTRAMUSCULAR | Status: AC
  Administered 2015-07-21: 10 mg via INTRAVENOUS
  Filled 2015-07-21: qty 1

## 2015-07-21 MED ORDER — LACTATED RINGERS IV BOLUS (SEPSIS)
1000.0000 mL | Freq: Once | INTRAVENOUS | Status: AC
  Administered 2015-07-21: 1000 mL via INTRAVENOUS

## 2015-07-21 MED ORDER — METOCLOPRAMIDE HCL 5 MG/ML IJ SOLN
10.0000 mg | Freq: Once | INTRAMUSCULAR | Status: AC
  Administered 2015-07-21: 10 mg via INTRAVENOUS
  Filled 2015-07-21: qty 2

## 2015-07-21 MED ORDER — DIPHENHYDRAMINE HCL 50 MG/ML IJ SOLN
12.5000 mg | Freq: Once | INTRAMUSCULAR | Status: AC
  Administered 2015-07-21: 12.5 mg via INTRAVENOUS
  Filled 2015-07-21: qty 1

## 2015-07-21 NOTE — MAU Note (Signed)
Pt reports having lower abd pain for 2-3 days. C/o vaginal odor ( no discharge) and a headache.

## 2015-07-21 NOTE — Discharge Instructions (Signed)

## 2015-07-21 NOTE — MAU Provider Note (Signed)
History     CSN: [id number]  Arrival date and time: 07/21/15 [id number]   First Provider Initiated Contact with Patient 07/21/15 1000      Chief Complaint  Patient presents with  . Abdominal Pain  . Headache   HPI   [name] is a 29 y.o. female [phone] at 3131w2d with a history of migraine headache presenting to MAU with a migraine.   The migraine started today around 0700; the pain started from the back and has radiated to the front of her head. She has not taken anything for pain. She currently rates her pain 9  She has taken Fioricet in the past for migraines and feels that has worked well.   Patient has an appointment scheduled in the clinic this month.   OB History    Gravida Para Term Preterm AB TAB SAB Ectopic Multiple Living   6 4 4  1  1   4       Past Medical History  Diagnosis Date  . Abnormal Pap smear   . Gestational diabetes     all pregnancies glyburide started 11/4  . Thrombocytopenia (HCC)   . Headache(784.0)   . Vaginal Pap smear, abnormal   . Hernia, umbilical     Past Surgical History  Procedure Laterality Date  . Wisdom tooth extraction    . Umbilical hernia repair N/A 11/22/2014    Procedure: LAPAROSCOPIC UMBILICAL HERNIA;  Surgeon: Axel FillerArmando Ramirez, MD;  Location: [facility] OR;  Service: General;  Laterality: N/A;    Family History  Problem Relation Age of Onset  . Hypertension Father   . Diabetes Father   . Diabetes Mother   . Seizures Brother     Social History  Substance Use Topics  . Smoking status: Never Smoker   . Smokeless tobacco: Never Used  . Alcohol Use: No    Allergies:  Allergies  Allergen Reactions  . Latex Itching and Rash  . Penicillins Itching and Rash    Has patient had a PCN reaction causing immediate rash, facial/tongue/throat swelling, SOB or lightheadedness with hypotension: Yes Has patient had a PCN reaction causing severe rash involving mucus membranes or skin necrosis: Yes Has patient had a PCN reaction  that required hospitalization No Has patient had a PCN reaction occurring within the last 10 years: Yes If all of the above answers are "NO", then may proceed with Cephalosporin use.     Prescriptions prior to admission  Medication Sig Dispense Refill Last Dose  . aspirin-acetaminophen-caffeine (EXCEDRIN MIGRAINE) 250-250-65 MG tablet Take 3 tablets by mouth daily as needed for headache or migraine.   07/18/2015  . Oxycodone HCl 10 MG TABS Take 1 tablet by mouth 3 (three) times daily as needed. For headaches/back pain  0 Past Month at Unknown time  . cyclobenzaprine (FLEXERIL) 10 MG tablet Take 1 tablet (10 mg total) by mouth 2 (two) times daily as needed for muscle spasms. (Patient not taking: Reported on 07/21/2015) 20 tablet 0 Not Taking at Unknown time  . naproxen (NAPROSYN) 500 MG tablet Take 1 tablet (500 mg total) by mouth 2 (two) times daily. (Patient not taking: Reported on 07/21/2015) 30 tablet 0 Not Taking at Unknown time  . nitrofurantoin, macrocrystal-monohydrate, (MACROBID) 100 MG capsule Take 1 capsule (100 mg total) by mouth 2 (two) times daily. X 7 days (Patient not taking: Reported on 07/21/2015) 14 capsule 0 Not Taking at Unknown time  . traMADol (ULTRAM) 50 MG tablet Take 1 tablet (50 mg total)  by mouth every 6 (six) hours as needed. (Patient not taking: Reported on 07/21/2015) 15 tablet 0 Not Taking at Unknown time   Results for orders placed or performed during the hospital encounter of 07/21/15 (from the past 48 hour(s))  Urinalysis, Routine w reflex microscopic (not at [facility])     Status: Abnormal   Collection Time: 07/21/15  8:35 AM  Result Value Ref Range   Color, Urine YELLOW YELLOW   APPearance CLOUDY (A) CLEAR   Specific Gravity, Urine >1.030 (H) 1.005 - 1.030   pH 6.0 5.0 - 8.0   Glucose, UA NEGATIVE NEGATIVE mg/dL   Hgb urine dipstick NEGATIVE NEGATIVE   Bilirubin Urine NEGATIVE NEGATIVE   Ketones, ur NEGATIVE NEGATIVE mg/dL   Protein, ur 30 (A) NEGATIVE mg/dL    Nitrite NEGATIVE NEGATIVE   Leukocytes, UA NEGATIVE NEGATIVE  Urine microscopic-add on     Status: Abnormal   Collection Time: 07/21/15  8:35 AM  Result Value Ref Range   Squamous Epithelial / LPF 6-30 (A) NONE SEEN   WBC, UA 0-5 0 - 5 WBC/hpf   RBC / HPF NONE SEEN 0 - 5 RBC/hpf   Bacteria, UA FEW (A) NONE SEEN   Urine-Other MUCOUS PRESENT   Wet prep, genital     Status: Abnormal   Collection Time: 07/21/15  9:43 AM  Result Value Ref Range   Yeast Wet Prep HPF POC NONE SEEN NONE SEEN   Trich, Wet Prep NONE SEEN NONE SEEN   Clue Cells Wet Prep HPF POC NONE SEEN NONE SEEN   WBC, Wet Prep HPF POC FEW (A) NONE SEEN    Comment: MODERATE BACTERIA SEEN   Sperm NONE SEEN     Review of Systems  Constitutional: Negative for fever.  Eyes: Positive for photophobia.  Gastrointestinal: Positive for nausea.  Neurological: Positive for headaches.   Physical Exam   Blood pressure 112/57, pulse 93, temperature 98.1 F (36.7 C), temperature source Oral, resp. rate 18, not currently breastfeeding.  Physical Exam  Constitutional: She is oriented to person, place, and time. She appears well-developed and well-nourished. No distress.  HENT:  Head: Normocephalic.  Eyes: Pupils are equal, round, and reactive to light.  Neck: Neck supple.  Respiratory: Effort normal.  Musculoskeletal: Normal range of motion.  Neurological: She is alert and oriented to person, place, and time. She has normal strength and normal reflexes. She is not disoriented. No cranial nerve deficit or sensory deficit. GCS eye subscore is 4. GCS verbal subscore is 5. GCS motor subscore is 6.  Skin: Skin is warm. She is not diaphoretic.    MAU Course  Procedures  None  MDM  + fetal heart tones via doppler  HA cocktail given: decadron, benadryl,  reglan. 1 liter of LR given   Patient rates her HA 0/10 at the time of discharge. Patient tolerating PO fluids and crackers.   Assessment and Plan   A:  1. Migraine  without aura and without status migrainosus, not intractable    P:  Discharge home in stable condition Keep appointment scheduled in the clinic RX: Fioricet Return to MAU if symptoms worsen  Increase PO fluid intake.   Duane Lope, NP 07/21/2015 1:29 PM

## 2015-07-22 LAB — GC/CHLAMYDIA PROBE AMP (~~LOC~~) NOT AT ARMC
Chlamydia: NEGATIVE
Neisseria Gonorrhea: NEGATIVE

## 2015-08-01 ENCOUNTER — Ambulatory Visit (INDEPENDENT_AMBULATORY_CARE_PROVIDER_SITE_OTHER): Payer: Medicaid Other | Admitting: Family Medicine

## 2015-08-01 ENCOUNTER — Encounter: Payer: Medicaid Other | Attending: Family Medicine | Admitting: *Deleted

## 2015-08-01 VITALS — BP 106/61 | HR 85 | Temp 97.4°F | Wt 324.3 lb

## 2015-08-01 DIAGNOSIS — Z713 Dietary counseling and surveillance: Secondary | ICD-10-CM | POA: Insufficient documentation

## 2015-08-01 DIAGNOSIS — E669 Obesity, unspecified: Secondary | ICD-10-CM

## 2015-08-01 DIAGNOSIS — O24419 Gestational diabetes mellitus in pregnancy, unspecified control: Secondary | ICD-10-CM

## 2015-08-01 DIAGNOSIS — O0992 Supervision of high risk pregnancy, unspecified, second trimester: Secondary | ICD-10-CM

## 2015-08-01 DIAGNOSIS — O99212 Obesity complicating pregnancy, second trimester: Secondary | ICD-10-CM

## 2015-08-01 DIAGNOSIS — L298 Other pruritus: Secondary | ICD-10-CM

## 2015-08-01 DIAGNOSIS — O24912 Unspecified diabetes mellitus in pregnancy, second trimester: Secondary | ICD-10-CM

## 2015-08-01 DIAGNOSIS — N898 Other specified noninflammatory disorders of vagina: Secondary | ICD-10-CM

## 2015-08-01 DIAGNOSIS — O24919 Unspecified diabetes mellitus in pregnancy, unspecified trimester: Secondary | ICD-10-CM | POA: Insufficient documentation

## 2015-08-01 LAB — POCT URINALYSIS DIP (DEVICE)
BILIRUBIN URINE: NEGATIVE
GLUCOSE, UA: NEGATIVE mg/dL
Hgb urine dipstick: NEGATIVE
NITRITE: NEGATIVE
Protein, ur: NEGATIVE mg/dL
SPECIFIC GRAVITY, URINE: 1.025 (ref 1.005–1.030)
UROBILINOGEN UA: 0.2 mg/dL (ref 0.0–1.0)
pH: 5.5 (ref 5.0–8.0)

## 2015-08-01 LAB — COMPREHENSIVE METABOLIC PANEL
ALT: 10 U/L (ref 6–29)
AST: 14 U/L (ref 10–30)
Albumin: 3.4 g/dL — ABNORMAL LOW (ref 3.6–5.1)
Alkaline Phosphatase: 50 U/L (ref 33–115)
BUN: 8 mg/dL (ref 7–25)
CHLORIDE: 100 mmol/L (ref 98–110)
CO2: 21 mmol/L (ref 20–31)
Calcium: 9 mg/dL (ref 8.6–10.2)
Creat: 0.46 mg/dL — ABNORMAL LOW (ref 0.50–1.10)
Glucose, Bld: 194 mg/dL — ABNORMAL HIGH (ref 65–99)
POTASSIUM: 3.8 mmol/L (ref 3.5–5.3)
Sodium: 134 mmol/L — ABNORMAL LOW (ref 135–146)
TOTAL PROTEIN: 6.4 g/dL (ref 6.1–8.1)
Total Bilirubin: 0.4 mg/dL (ref 0.2–1.2)

## 2015-08-01 LAB — TSH: TSH: 1.16 u[IU]/mL (ref 0.350–4.500)

## 2015-08-01 MED ORDER — PROMETHAZINE HCL 25 MG PO TABS: 25.0000 mg | ORAL_TABLET | Freq: Four times a day (QID) | ORAL | Status: DC | PRN

## 2015-08-01 MED ORDER — ACCU-CHEK FASTCLIX LANCETS MISC: 1.0000 [IU] | Freq: Four times a day (QID) | Status: DC

## 2015-08-01 MED ORDER — TERCONAZOLE 0.4 % VA CREA
1.0000 | TOPICAL_CREAM | Freq: Every day | VAGINAL | Status: DC
Start: 2015-08-01 — End: 2015-09-12

## 2015-08-01 MED ORDER — PRENATAL VITAMINS 0.8 MG PO TABS: 1.0000 | ORAL_TABLET | Freq: Every day | ORAL | Status: DC

## 2015-08-01 MED ORDER — ASPIRIN 81 MG PO CHEW: 81.0000 mg | CHEWABLE_TABLET | Freq: Every day | ORAL | Status: DC

## 2015-08-01 MED ORDER — GLUCOSE BLOOD VI STRP: ORAL_STRIP | Status: DC

## 2015-08-01 MED ORDER — ACCU-CHEK NANO SMARTVIEW W/DEVICE KIT: 1.0000 | PACK | Status: DC

## 2015-08-01 NOTE — Progress Notes (Signed)
Nutrition note: 1st visit consult & GDM diet education Pt has h/o obesity, had GDM with previous pregnancy, & has GDM with this pregnancy. Pt has gained 19.3# @ 5057w6d, which is > expected. Pt reports eating 3 meals & 2 snacks/d. Pt has not started a PNV yet. Pt reports having some N&V and heartburn. NKFA. Pt reports walking for ~20 mins 4x/wk. Pt received verbal & written review of GDM diet. Encouraged PNV daily. Discussed wt gain goals of 11-20# or 0.5#/wk. Pt agrees to follow GDM diet with 3 meals & 1-3 snacks/d with proper CHO/ protein combination. Pt does not have WIC but plans to apply. Pt plans to BF. F/u in 4-6 wks Blondell RevealLaura Kile Kabler, MS, RD, LDN, [facility]

## 2015-08-01 NOTE — Progress Notes (Signed)
  Patient was seen on 08/01/15 for Gestational Diabetes self-management . This is patient's 5th pregnancy. She had GDM with her 31yo. Both parents have T2DM. FBS today $RemoveB'216mg'gyOuNrBo$ /dl. Patient will begin testing QID today. In discussion with Dr Elly Modena I will call patient on 08/04/15 to review readings and determine pharmacological needs.  The following learning objectives were met by the patient :   States the definition of Gestational Diabetes  States why dietary management is important in controlling blood glucose  States when to check blood glucose levels  Demonstrates proper blood glucose monitoring techniques  States the effect of stress and exercise on blood glucose levels  States the importance of limiting caffeine and abstaining from alcohol and smoking  Plan:  Consider  increasing your activity level by walking daily as tolerated Begin checking BG before breakfast and 2 hours after first bit of breakfast, lunch and dinner after  as directed by MD  Take medication  as directed by MD  Blood glucose monitor given: Accu Chek Nano order sent to pharmacy per Dr. Kennon Rounds Blood glucose reading: FBS $RemoveBefo'216mg'NZYUlxpnfBZ$ /dl  Patient instructed to monitor glucose levels: FBS: 60 - <90 2 hour: <120  Patient received the following handouts:  Nutrition Diabetes and Pregnancy  Carbohydrate Counting List  Meal Planning worksheet  Patient will be seen for follow-up as needed.

## 2015-08-01 NOTE — Patient Instructions (Signed)
Gestational Diabetes Mellitus Gestational diabetes mellitus, often simply referred to as gestational diabetes, is a type of diabetes that some women develop during pregnancy. In gestational diabetes, the pancreas does not make enough insulin (a hormone), the cells are less responsive to the insulin that is made (insulin resistance), or both.Normally, insulin moves sugars from food into the tissue cells. The tissue cells use the sugars for energy. The lack of insulin or the lack of normal response to insulin causes excess sugars to build up in the blood instead of going into the tissue cells. As a result, high blood sugar (hyperglycemia) develops. The effect of high sugar (glucose) levels can cause many problems.  RISK FACTORS You have an increased chance of developing gestational diabetes if you have a family history of diabetes and also have one or more of the following risk factors:  A body mass index over 30 (obesity).  A previous pregnancy with gestational diabetes.  An older age at the time of pregnancy. If blood glucose levels are kept in the normal range during pregnancy, women can have a healthy pregnancy. If your blood glucose levels are not well controlled, there may be risks to you, your unborn baby (fetus), your labor and delivery, or your newborn baby.  SYMPTOMS  If symptoms are experienced, they are much like symptoms you would normally expect during pregnancy. The symptoms of gestational diabetes include:   Increased thirst (polydipsia).  Increased urination (polyuria).  Increased urination during the night (nocturia).  Weight loss. This weight loss may be rapid.  Frequent, recurring infections.  Tiredness (fatigue).  Weakness.  Vision changes, such as blurred vision.  Fruity smell to your breath.  Abdominal pain. DIAGNOSIS Diabetes is diagnosed when blood glucose levels are increased. Your blood glucose level may be checked by one or more of the following blood  tests:  A fasting blood glucose test. You will not be allowed to eat for at least 8 hours before a blood sample is taken.  A random blood glucose test. Your blood glucose is checked at any time of the day regardless of when you ate.  An oral glucose tolerance test (OGTT). Your blood glucose is measured after you have not eaten (fasted) for 1-3 hours and then after you drink a glucose-containing beverage. Since the hormones that cause insulin resistance are highest at about 24-28 weeks of a pregnancy, an OGTT is usually performed during that time. If you have risk factors, you may be screened for undiagnosed type 2 diabetes at your first prenatal visit. TREATMENT  Gestational diabetes should be managed first with diet and exercise. Medicines may be added only if they are needed.  You will need to take diabetes medicine or insulin daily to keep blood glucose levels in the desired range.  You will need to match insulin dosing with exercise and healthy food choices. If you have gestational diabetes, your treatment goal is to maintain the following blood glucose levels:  Before meals (preprandial): at or below 95 mg/dL.  After meals (postprandial):  One hour after a meal: at or below 140 mg/dL.  Two hours after a meal: at or below 120 mg/dL. If you have pre-existing type 1 or type 2 diabetes, your treatment goal is to maintain the following blood glucose levels:  Before meals, at bedtime, and overnight: 60-99 mg/dL.  After meals: peak of 100-129 mg/dL. HOME CARE INSTRUCTIONS   Have your hemoglobin A1c level checked twice a year.  Perform daily blood glucose monitoring as directed by   your health care provider. It is common to perform frequent blood glucose monitoring.  Monitor urine ketones when you are ill and as directed by your health care provider.  Take your diabetes medicine and insulin as directed by your health care provider to maintain your blood glucose level in the desired  range.  Never run out of diabetes medicine or insulin. It is needed every day.  Adjust insulin based on your intake of carbohydrates. Carbohydrates can raise blood glucose levels but need to be included in your diet. Carbohydrates provide vitamins, minerals, and fiber which are an essential part of a healthy diet. Carbohydrates are found in fruits, vegetables, whole grains, dairy products, legumes, and foods containing added sugars.  Eat healthy foods. Alternate 3 meals with 3 snacks.  Maintain a healthy weight gain. The usual total expected weight gain varies according to your prepregnancy body mass index (BMI).  Carry a medical alert card or wear your medical alert jewelry.  Carry a 15-gram carbohydrate snack with you at all times to treat low blood glucose (hypoglycemia). Some examples of 15-gram carbohydrate snacks include:  Glucose tablets, 3 or 4.  Glucose gel, 15-gram tube.  Raisins, 2 tablespoons (24 g).  Jelly beans, 6.  Animal crackers, 8.  Fruit juice, regular soda, or low-fat milk, 4 ounces (120 mL).  Gummy treats, 9.  Recognize hypoglycemia. Hypoglycemia during pregnancy occurs with blood glucose levels of 60 mg/dL and below. The risk for hypoglycemia increases when fasting or skipping meals, during or after intense exercise, and during sleep. Hypoglycemia symptoms can include:  Tremors or shakes.  Decreased ability to concentrate.  Sweating.  Increased heart rate.  Headache.  Dry mouth.  Hunger.  Irritability.  Anxiety.  Restless sleep.  Altered speech or coordination.  Confusion.  Treat hypoglycemia promptly. If you are alert and able to safely swallow, follow the 15:15 rule:  Take 15-20 grams of rapid-acting glucose or carbohydrate. Rapid-acting options include glucose gel, glucose tablets, or 4 ounces (120 mL) of fruit juice, regular soda, or low-fat milk.  Check your blood glucose level 15 minutes after taking the glucose.  Take 15-20  grams more of glucose if the repeat blood glucose level is still 70 mg/dL or below.  Eat a meal or snack within 1 hour once blood glucose levels return to normal.  Be alert to polyuria (excess urination) and polydipsia (excess thirst) which are early signs of hyperglycemia. An early awareness of hyperglycemia allows for prompt treatment. Treat hyperglycemia as directed by your health care provider.  Engage in at least 30 minutes of physical activity a day or as directed by your health care provider. Ten minutes of physical activity timed 30 minutes after each meal is encouraged to control postprandial blood glucose levels.  Adjust your insulin dosing and food intake as needed if you start a new exercise or sport.  Follow your sick-day plan at any time you are unable to eat or drink as usual.  Avoid tobacco and alcohol use.  Keep all follow-up visits as directed by your health care provider.  Follow the advice of your health care provider regarding your prenatal and post-delivery (postpartum) appointments, meal planning, exercise, medicines, vitamins, blood tests, other medical tests, and physical activities.  Perform daily skin and foot care. Examine your skin and feet daily for cuts, bruises, redness, nail problems, bleeding, blisters, or sores.  Brush your teeth and gums at least twice a day and floss at least once a day. Follow up with your dentist   regularly.  Schedule an eye exam during the first trimester of your pregnancy or as directed by your health care provider.  Share your diabetes management plan with your workplace or school.  Stay up-to-date with immunizations.  Learn to manage stress.  Obtain ongoing diabetes education and support as needed.  Learn about and consider breastfeeding your baby.  You should have your blood sugar level checked 6-12 weeks after delivery. This is done with an oral glucose tolerance test (OGTT). SEEK MEDICAL CARE IF:   You are unable to  eat food or drink fluids for more than 6 hours.  You have nausea and vomiting for more than 6 hours.  You have a blood glucose level of 200 mg/dL and you have ketones in your urine.  There is a change in mental status.  You develop vision problems.  You have a persistent headache.  You have upper abdominal pain or discomfort.  You develop an additional serious illness.  You have diarrhea for more than 6 hours.  You have been sick or have had a fever for a couple of days and are not getting better. SEEK IMMEDIATE MEDICAL CARE IF:   You have difficulty breathing.  You no longer feel the baby moving.  You are bleeding or have discharge from your vagina.  You start having premature contractions or labor. MAKE SURE YOU:  Understand these instructions.  Will watch your condition.  Will get help right away if you are not doing well or get worse.   This information is not intended to replace advice given to you by your health care provider. Make sure you discuss any questions you have with your health care provider.   Document Released: 11/12/2000 Document Revised: 08/27/2014 Document Reviewed: 03/04/2012 [employer] Interactive Patient Education 2016 [employer]  Breastfeeding Deciding to breastfeed is one of the best choices you can make for you and your baby. A change in hormones during pregnancy causes your breast tissue to grow and increases the number and size of your milk ducts. These hormones also allow proteins, sugars, and fats from your blood supply to make breast milk in your milk-producing glands. Hormones prevent breast milk from being released before your baby is born as well as prompt milk flow after birth. Once breastfeeding has begun, thoughts of your baby, as well as his or her sucking or crying, can stimulate the release of milk from your milk-producing glands.  BENEFITS OF BREASTFEEDING For Your Baby  Your first milk (colostrum) helps your baby's digestive  system function better.  There are antibodies in your milk that help your baby fight off infections.  Your baby has a lower incidence of asthma, allergies, and sudden infant death syndrome.  The nutrients in breast milk are better for your baby than infant formulas and are designed uniquely for your baby's needs.  Breast milk improves your baby's brain development.  Your baby is less likely to develop other conditions, such as childhood obesity, asthma, or type 2 diabetes mellitus. For You  Breastfeeding helps to create a very special bond between you and your baby.  Breastfeeding is convenient. Breast milk is always available at the correct temperature and costs nothing.  Breastfeeding helps to burn calories and helps you lose the weight gained during pregnancy.  Breastfeeding makes your uterus contract to its prepregnancy size faster and slows bleeding (lochia) after you give birth.   Breastfeeding helps to lower your risk of developing type 2 diabetes mellitus, osteoporosis, and breast or ovarian cancer   later in life. SIGNS THAT YOUR BABY IS HUNGRY Early Signs of Hunger  Increased alertness or activity.  Stretching.  Movement of the head from side to side.  Movement of the head and opening of the mouth when the corner of the mouth or cheek is stroked (rooting).  Increased sucking sounds, smacking lips, cooing, sighing, or squeaking.  Hand-to-mouth movements.  Increased sucking of fingers or hands. Late Signs of Hunger  Fussing.  Intermittent crying. Extreme Signs of Hunger Signs of extreme hunger will require calming and consoling before your baby will be able to breastfeed successfully. Do not wait for the following signs of extreme hunger to occur before you initiate breastfeeding:  Restlessness.  A loud, strong cry.  Screaming. BREASTFEEDING BASICS Breastfeeding Initiation  Find a comfortable place to sit or lie down, with your neck and back well  supported.  Place a pillow or rolled up blanket under your baby to bring him or her to the level of your breast (if you are seated). Nursing pillows are specially designed to help support your arms and your baby while you breastfeed.  Make sure that your baby's abdomen is facing your abdomen.  Gently massage your breast. With your fingertips, massage from your chest wall toward your nipple in a circular motion. This encourages milk flow. You may need to continue this action during the feeding if your milk flows slowly.  Support your breast with 4 fingers underneath and your thumb above your nipple. Make sure your fingers are well away from your nipple and your baby's mouth.  Stroke your baby's lips gently with your finger or nipple.  When your baby's mouth is open wide enough, quickly bring your baby to your breast, placing your entire nipple and as much of the colored area around your nipple (areola) as possible into your baby's mouth.  More areola should be visible above your baby's upper lip than below the lower lip.  Your baby's tongue should be between his or her lower gum and your breast.  Ensure that your baby's mouth is correctly positioned around your nipple (latched). Your baby's lips should create a seal on your breast and be turned out (everted).  It is common for your baby to suck about 2-3 minutes in order to start the flow of breast milk. Latching Teaching your baby how to latch on to your breast properly is very important. An improper latch can cause nipple pain and decreased milk supply for you and poor weight gain in your baby. Also, if your baby is not latched onto your nipple properly, he or she may swallow some air during feeding. This can make your baby fussy. Burping your baby when you switch breasts during the feeding can help to get rid of the air. However, teaching your baby to latch on properly is still the best way to prevent fussiness from swallowing air while  breastfeeding. Signs that your baby has successfully latched on to your nipple:  Silent tugging or silent sucking, without causing you pain.  Swallowing heard between every 3-4 sucks.  Muscle movement above and in front of his or her ears while sucking. Signs that your baby has not successfully latched on to nipple:  Sucking sounds or smacking sounds from your baby while breastfeeding.  Nipple pain. If you think your baby has not latched on correctly, slip your finger into the corner of your baby's mouth to break the suction and place it between your baby's gums. Attempt breastfeeding initiation again. Signs   of Successful Breastfeeding Signs from your baby:  A gradual decrease in the number of sucks or complete cessation of sucking.  Falling asleep.  Relaxation of his or her body.  Retention of a small amount of milk in his or her mouth.  Letting go of your breast by himself or herself. Signs from you:  Breasts that have increased in firmness, weight, and size 1-3 hours after feeding.  Breasts that are softer immediately after breastfeeding.  Increased milk volume, as well as a change in milk consistency and color by the fifth day of breastfeeding.  Nipples that are not sore, cracked, or bleeding. Signs That Your Baby is Getting Enough Milk  Wetting at least 3 diapers in a 24-hour period. The urine should be clear and pale yellow by age 5 days.  At least 3 stools in a 24-hour period by age 5 days. The stool should be soft and yellow.  At least 3 stools in a 24-hour period by age 7 days. The stool should be seedy and yellow.  No loss of weight greater than 10% of birth weight during the first 3 days of age.  Average weight gain of 4-7 ounces (113-198 g) per week after age 4 days.  Consistent daily weight gain by age 5 days, without weight loss after the age of 2 weeks. After a feeding, your baby may spit up a small amount. This is common. BREASTFEEDING FREQUENCY AND  DURATION Frequent feeding will help you make more milk and can prevent sore nipples and breast engorgement. Breastfeed when you feel the need to reduce the fullness of your breasts or when your baby shows signs of hunger. This is called "breastfeeding on demand." Avoid introducing a pacifier to your baby while you are working to establish breastfeeding (the first 4-6 weeks after your baby is born). After this time you may choose to use a pacifier. Research has shown that pacifier use during the first year of a baby's life decreases the risk of sudden infant death syndrome (SIDS). Allow your baby to feed on each breast as long as he or she wants. Breastfeed until your baby is finished feeding. When your baby unlatches or falls asleep while feeding from the first breast, offer the second breast. Because newborns are often sleepy in the first few weeks of life, you may need to awaken your baby to get him or her to feed. Breastfeeding times will vary from baby to baby. However, the following rules can serve as a guide to help you ensure that your baby is properly fed:  Newborns (babies 4 weeks of age or younger) may breastfeed every 1-3 hours.  Newborns should not go longer than 3 hours during the day or 5 hours during the night without breastfeeding.  You should breastfeed your baby a minimum of 8 times in a 24-hour period until you begin to introduce solid foods to your baby at around 6 months of age. BREAST MILK PUMPING Pumping and storing breast milk allows you to ensure that your baby is exclusively fed your breast milk, even at times when you are unable to breastfeed. This is especially important if you are going back to work while you are still breastfeeding or when you are not able to be present during feedings. Your lactation consultant can give you guidelines on how long it is safe to store breast milk. A breast pump is a machine that allows you to pump milk from your breast into a sterile bottle.  The pumped   breast milk can then be stored in a refrigerator or freezer. Some breast pumps are operated by hand, while others use electricity. Ask your lactation consultant which type will work best for you. Breast pumps can be purchased, but some hospitals and breastfeeding support groups lease breast pumps on a monthly basis. A lactation consultant can teach you how to hand express breast milk, if you prefer not to use a pump. CARING FOR YOUR BREASTS WHILE YOU BREASTFEED Nipples can become dry, cracked, and sore while breastfeeding. The following recommendations can help keep your breasts moisturized and healthy:  Avoid using soap on your nipples.  Wear a supportive bra. Although not required, special nursing bras and tank tops are designed to allow access to your breasts for breastfeeding without taking off your entire bra or top. Avoid wearing underwire-style bras or extremely tight bras.  Air dry your nipples for 3-4minutes after each feeding.  Use only cotton bra pads to absorb leaked breast milk. Leaking of breast milk between feedings is normal.  Use lanolin on your nipples after breastfeeding. Lanolin helps to maintain your skin's normal moisture barrier. If you use pure lanolin, you do not need to wash it off before feeding your baby again. Pure lanolin is not toxic to your baby. You may also hand express a few drops of breast milk and gently massage that milk into your nipples and allow the milk to air dry. In the first few weeks after giving birth, some women experience extremely full breasts (engorgement). Engorgement can make your breasts feel heavy, warm, and tender to the touch. Engorgement peaks within 3-5 days after you give birth. The following recommendations can help ease engorgement:  Completely empty your breasts while breastfeeding or pumping. You may want to start by applying warm, moist heat (in the shower or with warm water-soaked hand towels) just before feeding or pumping.  This increases circulation and helps the milk flow. If your baby does not completely empty your breasts while breastfeeding, pump any extra milk after he or she is finished.  Wear a snug bra (nursing or regular) or tank top for 1-2 days to signal your body to slightly decrease milk production.  Apply ice packs to your breasts, unless this is too uncomfortable for you.  Make sure that your baby is latched on and positioned properly while breastfeeding. If engorgement persists after 48 hours of following these recommendations, contact your health care provider or a lactation consultant. OVERALL HEALTH CARE RECOMMENDATIONS WHILE BREASTFEEDING  Eat healthy foods. Alternate between meals and snacks, eating 3 of each per day. Because what you eat affects your breast milk, some of the foods may make your baby more irritable than usual. Avoid eating these foods if you are sure that they are negatively affecting your baby.  Drink milk, fruit juice, and water to satisfy your thirst (about 10 glasses a day).  Rest often, relax, and continue to take your prenatal vitamins to prevent fatigue, stress, and anemia.  Continue breast self-awareness checks.  Avoid chewing and smoking tobacco. Chemicals from cigarettes that pass into breast milk and exposure to secondhand smoke may harm your baby.  Avoid alcohol and drug use, including marijuana. Some medicines that may be harmful to your baby can pass through breast milk. It is important to ask your health care provider before taking any medicine, including all over-the-counter and prescription medicine as well as vitamin and herbal supplements. It is possible to become pregnant while breastfeeding. If birth control is desired, ask   your health care provider about options that will be safe for your baby. SEEK MEDICAL CARE IF:  You feel like you want to stop breastfeeding or have become frustrated with breastfeeding.  You have painful breasts or  nipples.  Your nipples are cracked or bleeding.  Your breasts are red, tender, or warm.  You have a swollen area on either breast.  You have a fever or chills.  You have nausea or vomiting.  You have drainage other than breast milk from your nipples.  Your breasts do not become full before feedings by the fifth day after you give birth.  You feel sad and depressed.  Your baby is too sleepy to eat well.  Your baby is having trouble sleeping.   Your baby is wetting less than 3 diapers in a 24-hour period.  Your baby has less than 3 stools in a 24-hour period.  Your baby's skin or the white part of his or her eyes becomes yellow.   Your baby is not gaining weight by 5 days of age. SEEK IMMEDIATE MEDICAL CARE IF:  Your baby is overly tired (lethargic) and does not want to wake up and feed.  Your baby develops an unexplained fever.   This information is not intended to replace advice given to you by your health care provider. Make sure you discuss any questions you have with your health care provider.   Document Released: 08/06/2005 Document Revised: 04/27/2015 Document Reviewed: 01/28/2013 [employer] Interactive Patient Education 2016 [employer]  

## 2015-08-01 NOTE — Progress Notes (Signed)
Subjective:    Monique Schmidt is a [id number] 56w6dbeing seen today for her first obstetrical visit.  Her obstetrical history is significant for obesity and diabetes - random BS 248 this pregnancy. Pregnancy history fully reviewed.  Patient reports no complaints.  Filed Vitals:   08/01/15 0940  BP: 106/61  Pulse: 85  Temp: 97.4 F (36.3 C)  Weight: 324 lb 4.8 oz (147.102 kg)    HISTORY: OB History  Gravida Para Term Preterm AB SAB TAB Ectopic Multiple Living  _0 # Outcome Date GA Lbr Len/2nd Weight Sex Delivery Anes PTL Lv  6 Current           5 Term 12/15/13 323w0d6:43 / 00:02 8 lb 13.8 oz (4.02 kg) F Vag-Spont None  Y  4 Term 06/26/12 3933w1d:07 / 00:01 7 lb 2 oz (3.232 kg) F Vag-Spont None  Y  3 Term 03/08/11 39w39w1d27 / 00:02 8 lb 9 oz (3.884 kg) F Vag-Spont None  Y     Comments: none  2 Term 05/19/09 40w08w0dVag-Spont None N Y  1 SAB              Past Medical History  Diagnosis Date  . Abnormal Pap smear   . Gestational diabetes     all pregnancies glyburide started 11/4  . Thrombocytopenia (HCC) [place] Headache(784.0)   . Vaginal Pap smear, abnormal   . Hernia, umbilical    Past Surgical History  Procedure Laterality Date  . Wisdom tooth extraction    . Umbilical hernia repair N/A 11/22/2014    Procedure: LAPAROSCOPIC UMBILICAL HERNIA;  Surgeon: ArmanRalene Ok  [facility];  Laterality: N/A;   Family History  Problem Relation Age of Onset  . Hypertension Father   . Diabetes Father   . Diabetes Mother   . Seizures Brother      Exam     Skin: normal coloration and turgor, no rashes    Neurologic: oriented, no focal deficits   Extremities: normal strength, tone, and muscle mass, ROM of all joints is normal   HEENT extra ocular movement intact, sclera clear, anicteric, oropharynx clear, no lesions and neck supple with midline trachea   Mouth/Teeth mucous membranes moist, pharynx normal without lesions    Neck supple   Cardiovascular: regular rate and rhythm, no murmurs or gallops   Respiratory:  appears well, vitals normal, no respiratory distress, acyanotic, normal RR, ear and throat exam is normal, chest clear, no wheezing, crepitations, rhonchi, normal symmetric air entry   Abdomen: soft, non-tender; bowel sounds normal; no masses,  no organomegaly      Assessment/Plan:    Pregnancy: [id number] diabetes mellitus (GDM) in first trimester, gestational diabetes method of control unspecified To see Diabetes and Nutrition today Labs today - Prenatal Profile - Hemoglobinopathy evaluation - Protein / creatinine ratio, urine - Comprehensive metabolic panel - TSH - Hemoglobin A1c - Blood Glucose Monitoring Suppl (ACCU-CHEK NANO SMARTVIEW) W/DEVICE KIT; 1 Device by Does not apply route as directed. Check fasting, and two hours after breakfast, lunch and dinner  Dispense: 1 kit; Refill: 0 - glucose blood (ACCU-CHEK SMARTVIEW) test strip; Check blood sugars 4x/daily  Dispense: 100 each; Refill: 12 - ACCU-CHEK FASTCLIX LANCETS MISC; 1 Units by Percutaneous route 4 (four) times daily.  Dispense: 100 each; Refill: 12 - aspirin 81 MG chewable tablet; Chew  1 tablet (81 mg total) by mouth daily.  Dispense: 90 tablet; Refill: 3  2. Obesity in pregnancy, antepartum, second trimester 15 lbs weight gain maximum  3. Supervision of high-risk pregnancy, second trimester Begin prenatal care.  - Culture, OB Urine - promethazine (PHENERGAN) 25 MG tablet; Take 1 tablet (25 mg total) by mouth every 6 (six) hours as needed for nausea.  Dispense: 42 tablet; Refill: 2 - Prenatal Multivit-Min-Fe-FA (PRENATAL VITAMINS) 0.8 MG tablet; Take 1 tablet by mouth daily.  Dispense: 30 tablet; Refill: 12  4. Vagina itching  - terconazole (TERAZOL 7) 0.4 % vaginal cream; Place 1 applicator vaginally at bedtime.  Dispense: 45 g; Refill: 0      Ryett Hamman S 08/01/2015

## 2015-08-01 NOTE — Progress Notes (Signed)
Pt would like a rx for Prenatal vitamins.  Pt c/o vaginal itching x 2-3 week. She was check at [facility] and all test came back negative.

## 2015-08-02 ENCOUNTER — Encounter: Payer: Self-pay | Admitting: Family Medicine

## 2015-08-02 DIAGNOSIS — D696 Thrombocytopenia, unspecified: Secondary | ICD-10-CM | POA: Insufficient documentation

## 2015-08-02 LAB — PRENATAL PROFILE (SOLSTAS)
Antibody Screen: NEGATIVE
BASOS PCT: 0 % (ref 0–1)
Basophils Absolute: 0 10*3/uL (ref 0.0–0.1)
Eosinophils Absolute: 0.1 10*3/uL (ref 0.0–0.7)
Eosinophils Relative: 1 % (ref 0–5)
HEMATOCRIT: 37.7 % (ref 36.0–46.0)
HEMOGLOBIN: 13 g/dL (ref 12.0–15.0)
HEP B S AG: NEGATIVE
HIV: NONREACTIVE
LYMPHS ABS: 2.1 10*3/uL (ref 0.7–4.0)
LYMPHS PCT: 23 % (ref 12–46)
MCH: 28.1 pg (ref 26.0–34.0)
MCHC: 34.5 g/dL (ref 30.0–36.0)
MCV: 81.6 fL (ref 78.0–100.0)
MONO ABS: 0.5 10*3/uL (ref 0.1–1.0)
MONOS PCT: 5 % (ref 3–12)
NEUTROS ABS: 6.5 10*3/uL (ref 1.7–7.7)
NEUTROS PCT: 71 % (ref 43–77)
Platelets: 127 10*3/uL — ABNORMAL LOW (ref 150–400)
RBC: 4.62 MIL/uL (ref 3.87–5.11)
RDW: 15.3 % (ref 11.5–15.5)
RUBELLA: 11.5 {index} — AB (ref <0.90)
Rh Type: POSITIVE
WBC: 9.2 10*3/uL (ref 4.0–10.5)

## 2015-08-02 LAB — CULTURE, OB URINE

## 2015-08-02 LAB — PROTEIN / CREATININE RATIO, URINE
Creatinine, Urine: 193 mg/dL (ref 20–320)
Protein Creatinine Ratio: 109 mg/g creat (ref 21–161)
Total Protein, Urine: 21 mg/dL (ref 5–24)

## 2015-08-02 LAB — HEMOGLOBIN A1C
HEMOGLOBIN A1C: 8.2 % — AB (ref <5.7)
Mean Plasma Glucose: 189 mg/dL — ABNORMAL HIGH (ref <117)

## 2015-08-03 LAB — HEMOGLOBINOPATHY EVALUATION
HEMOGLOBIN OTHER: 0 %
HGB A2 QUANT: 2.9 % (ref 2.2–3.2)
HGB A: 97.1 % (ref 96.8–97.8)
Hgb F Quant: 0 % (ref 0.0–2.0)
Hgb S Quant: 0 %

## 2015-08-04 ENCOUNTER — Telehealth (HOSPITAL_COMMUNITY): Payer: Self-pay | Admitting: *Deleted

## 2015-08-08 ENCOUNTER — Encounter: Payer: Medicaid Other | Admitting: *Deleted

## 2015-08-08 ENCOUNTER — Ambulatory Visit: Payer: Medicaid Other | Admitting: *Deleted

## 2015-08-08 DIAGNOSIS — O24419 Gestational diabetes mellitus in pregnancy, unspecified control: Secondary | ICD-10-CM

## 2015-08-08 MED ORDER — METFORMIN HCL 500 MG PO TABS: 500.0000 mg | ORAL_TABLET | Freq: Two times a day (BID) | ORAL | Status: DC

## 2015-08-08 MED ORDER — GLYBURIDE 5 MG PO TABS: 5.0000 mg | ORAL_TABLET | Freq: Two times a day (BID) | ORAL | Status: DC

## 2015-08-08 NOTE — Progress Notes (Signed)
Pt present for glucose review. FBS range 88-218, 2hpp range 102-285. Proposed insulin therapy to patient, she declined. I discussed with Dr. Shawnie PonsPratt. Will try PO meds for now. Will see patient back in f/u.

## 2015-08-21 NOTE — L&D Delivery Note (Signed)
Delivery Note At 5:18 PM a viable female was delivered via Vaginal, Spontaneous Delivery (Presentation: Left Occiput Anterior).  APGAR: 9, 9; weight 9 lb 11.7 oz (4414 g).  Cord clamped and cut by Dr Cephus RicherAguilar; hospital cord blood sample collected. Placenta status: Intact, Spontaneous.  Cord: 3 vessels   Anesthesia: None  Episiotomy: None Lacerations: None Est. Blood Loss (mL): 250  Mom to postpartum.  Baby to Couplet care / Skin to Skin.  Delivery of infant and placenta by Dr Cephus RicherAguilar. I was gloved, present, and assisted w/ del of shoulders which was tight but presented no difficulty.   Cam HaiSHAW, Julita Ozbun CNM 01/20/2016, 5:55 PM

## 2015-08-22 ENCOUNTER — Encounter: Payer: Medicaid Other | Admitting: Family Medicine

## 2015-08-29 ENCOUNTER — Encounter: Payer: Medicaid Other | Admitting: Obstetrics and Gynecology

## 2015-08-29 ENCOUNTER — Encounter: Payer: Medicaid Other | Admitting: Obstetrics & Gynecology

## 2015-09-05 ENCOUNTER — Encounter: Payer: Medicaid Other | Admitting: Obstetrics and Gynecology

## 2015-09-05 NOTE — Progress Notes (Deleted)
Patient presents escorted by Dr. Debroah LoopArnold.  FBS range 99-145, 2hpp range 79-181. Will increase NPH to 34AM, 40PM and Novolog to 30 units with each meal. Updated RX send. Will see again in 1 week.

## 2015-09-12 ENCOUNTER — Encounter: Payer: Medicaid Other | Admitting: Obstetrics & Gynecology

## 2015-09-12 ENCOUNTER — Ambulatory Visit (INDEPENDENT_AMBULATORY_CARE_PROVIDER_SITE_OTHER): Payer: Medicaid Other | Admitting: Obstetrics & Gynecology

## 2015-09-12 VITALS — BP 110/68 | HR 70 | Temp 97.4°F | Wt 322.3 lb

## 2015-09-12 DIAGNOSIS — F1110 Opioid abuse, uncomplicated: Secondary | ICD-10-CM

## 2015-09-12 DIAGNOSIS — O99322 Drug use complicating pregnancy, second trimester: Secondary | ICD-10-CM | POA: Diagnosis present

## 2015-09-12 DIAGNOSIS — O24119 Pre-existing diabetes mellitus, type 2, in pregnancy, unspecified trimester: Secondary | ICD-10-CM

## 2015-09-12 DIAGNOSIS — O24112 Pre-existing diabetes mellitus, type 2, in pregnancy, second trimester: Secondary | ICD-10-CM | POA: Diagnosis not present

## 2015-09-12 DIAGNOSIS — Z1379 Encounter for other screening for genetic and chromosomal anomalies: Secondary | ICD-10-CM

## 2015-09-12 LAB — POCT URINALYSIS DIP (DEVICE)
BILIRUBIN URINE: NEGATIVE
Glucose, UA: NEGATIVE mg/dL
HGB URINE DIPSTICK: NEGATIVE
LEUKOCYTES UA: NEGATIVE
Nitrite: NEGATIVE
Protein, ur: NEGATIVE mg/dL
SPECIFIC GRAVITY, URINE: 1.025 (ref 1.005–1.030)
Urobilinogen, UA: 0.2 mg/dL (ref 0.0–1.0)
pH: 6.5 (ref 5.0–8.0)

## 2015-09-12 NOTE — Progress Notes (Signed)
Fetal Echo scheduled with Dr. Elizebeth Brooking on February 3rd @ 1330 Anatomy US scheduled for 09/15/14 @ 1400.

## 2015-09-12 NOTE — Progress Notes (Signed)
Subjective:  Monique Schmidt is a 30 y.o. [phone] at [redacted]w[redacted]d being seen today for ongoing prenatal care.  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  Patient reports no complaints.   . Vag. Bleeding: None.  Movement: Present. Denies leaking of fluid.   The following portions of the patient's history were reviewed and updated as appropriate: allergies, current medications, past family history, past medical history, past social history, past surgical history and problem list. Problem list updated.  Objective:   Filed Vitals:   09/12/15 1202  BP: 110/68  Pulse: 70  Temp: 97.4 F (36.3 C)  Weight: 322 lb 4.8 oz (146.194 kg)    Fetal Status: Fetal Heart Rate (bpm): 159   Movement: Present     General:  Alert, oriented and cooperative. Patient is in no acute distress.  Skin: Skin is warm and dry. No rash noted.   Cardiovascular: Normal heart rate noted  Respiratory: Normal respiratory effort, no problems with respiration noted  Abdomen: Soft, gravid, appropriate for gestational age. Pain/Pressure: Present     Pelvic: Vag. Bleeding: None     Cervical exam deferred        Extremities: Normal range of motion.  Edema: Trace  Mental Status: Normal mood and affect. Normal behavior. Normal judgment and thought content.   Urinalysis:    negative  Assessment and Plan:  Pregnancy: [id number] at [redacted]w[redacted]d  1. Genetic testing - AFP, Quad Screen  2. Type 2 diabetes mellitus affecting pregnancy, antepartum Very few values.  Pt did not take her medicine today or yesterday.  Pt infomred about detrimental  health outcome of noncompliance and hyperglycemia.  Pt understands that fetal death can occur. Pt agrees to test as directed  - [place] MFM OB DETAIL +14 WK; Future - US Fetal Echocardiography; Future - Compression stockings  3. Narcotic use in pregnancy Pt received 90 oxycodone early January.  Discovered on med rec from outside sources.  Pt has been getting prescriptions filled every month.  Pt reports that she does not take them any more since she found out she was pregnant.  If drug screen is positive, will have her meet with neonatal.  - CBC - Prescription Monitoring Profile (17)-[facility]  Preterm labor symptoms and general obstetric precautions including but not limited to vaginal bleeding, contractions, leaking of fluid and fetal movement were reviewed in detail with the patient. Please refer to After Visit Summary for other counseling recommendations.  Pt will see nutrition in one week.  MD in 2 weeks.  Return in about 2 weeks (around 09/26/2015).   Lesly Dukes, MD

## 2015-09-13 LAB — AFP, QUAD SCREEN
AFP: 47 ng/mL
Curr Gest Age: 19.6 wks.days
HCG TOTAL: 13.1 [IU]/mL
INH: 97.6 pg/mL
INTERPRETATION-AFP: NEGATIVE
MOM FOR AFP: 1.6
MOM FOR HCG: 1.04
MoM for INH: 0.9
OPEN SPINA BIFIDA: NEGATIVE
Osb Risk: 1:2130 {titer}
TRI 18 SCR RISK EST: NEGATIVE
UE3 VALUE: 1.29 ng/mL
uE3 Mom: 0.86

## 2015-09-13 LAB — CBC
HEMATOCRIT: 36 % (ref 36.0–46.0)
HEMOGLOBIN: 12.1 g/dL (ref 12.0–15.0)
MCH: 27.6 pg (ref 26.0–34.0)
MCHC: 33.6 g/dL (ref 30.0–36.0)
MCV: 82 fL (ref 78.0–100.0)
Platelets: 124 10*3/uL — ABNORMAL LOW (ref 150–400)
RBC: 4.39 MIL/uL (ref 3.87–5.11)
RDW: 15.4 % (ref 11.5–15.5)
WBC: 8.6 10*3/uL (ref 4.0–10.5)

## 2015-09-16 ENCOUNTER — Other Ambulatory Visit: Payer: Self-pay | Admitting: Obstetrics & Gynecology

## 2015-09-16 ENCOUNTER — Ambulatory Visit (HOSPITAL_COMMUNITY)
Admission: RE | Admit: 2015-09-16 | Discharge: 2015-09-16 | Disposition: A | Discharge status: Home / Self Care | Payer: Medicaid Other | Source: Ambulatory Visit | Attending: Obstetrics & Gynecology | Admitting: Obstetrics & Gynecology

## 2015-09-16 DIAGNOSIS — Z3A20 20 weeks gestation of pregnancy: Secondary | ICD-10-CM | POA: Insufficient documentation

## 2015-09-16 DIAGNOSIS — Z3689 Encounter for other specified antenatal screening: Secondary | ICD-10-CM

## 2015-09-16 DIAGNOSIS — O24119 Pre-existing diabetes mellitus, type 2, in pregnancy, unspecified trimester: Secondary | ICD-10-CM

## 2015-09-16 DIAGNOSIS — O0992 Supervision of high risk pregnancy, unspecified, second trimester: Secondary | ICD-10-CM

## 2015-09-16 DIAGNOSIS — O24112 Pre-existing diabetes mellitus, type 2, in pregnancy, second trimester: Secondary | ICD-10-CM

## 2015-09-16 DIAGNOSIS — Z8632 Personal history of gestational diabetes: Secondary | ICD-10-CM

## 2015-09-16 DIAGNOSIS — O99212 Obesity complicating pregnancy, second trimester: Secondary | ICD-10-CM

## 2015-09-16 DIAGNOSIS — O09292 Supervision of pregnancy with other poor reproductive or obstetric history, second trimester: Secondary | ICD-10-CM

## 2015-09-16 DIAGNOSIS — Z36 Encounter for antenatal screening of mother: Secondary | ICD-10-CM | POA: Insufficient documentation

## 2015-09-16 LAB — PRESCRIPTION MONITORING PROFILE (SOLSTAS)
AMPHETAMINE/METH: NEGATIVE ng/mL
BARBITURATE SCREEN, URINE: NEGATIVE ng/mL
BENZODIAZEPINE SCREEN, URINE: NEGATIVE ng/mL
Buprenorphine, Urine: NEGATIVE ng/mL
CARISOPRODOL, URINE: NEGATIVE ng/mL
Cocaine Metabolites: NEGATIVE ng/mL
Creatinine, Urine: 115.4 mg/dL (ref >20.0)
ECSTASY: NEGATIVE ng/mL
Fentanyl, Ur: NEGATIVE ng/mL
METHADONE SCREEN, URINE: NEGATIVE ng/mL
Meperidine, Ur: NEGATIVE ng/mL
NITRITES URINE, INITIAL: NEGATIVE ug/mL
Opiate Screen, Urine: NEGATIVE ng/mL
Oxycodone Screen, Ur: NEGATIVE ng/mL
PROPOXYPHENE: NEGATIVE ng/mL
TAPENTADOLUR: NEGATIVE ng/mL
TRAMADOL UR: NEGATIVE ng/mL
Zolpidem, Urine: NEGATIVE ng/mL
pH, Initial: 6.5 pH (ref 4.5–8.9)

## 2015-09-16 LAB — CANNABANOIDS (GC/LC/MS), URINE: THC-COOH (GC/LC/MS), ur confirm: 168 ng/mL — AB (ref <5)

## 2015-09-19 ENCOUNTER — Other Ambulatory Visit (HOSPITAL_COMMUNITY)
Admission: RE | Admit: 2015-09-19 | Discharge: 2015-09-19 | Disposition: A | Discharge status: Home / Self Care | Payer: Medicaid Other | Source: Ambulatory Visit | Attending: Obstetrics and Gynecology | Admitting: Obstetrics and Gynecology

## 2015-09-19 ENCOUNTER — Ambulatory Visit (INDEPENDENT_AMBULATORY_CARE_PROVIDER_SITE_OTHER): Payer: Medicaid Other | Admitting: Obstetrics and Gynecology

## 2015-09-19 VITALS — BP 122/76 | HR 89 | Temp 98.8°F | Wt 320.0 lb

## 2015-09-19 DIAGNOSIS — Z113 Encounter for screening for infections with a predominantly sexual mode of transmission: Secondary | ICD-10-CM | POA: Diagnosis not present

## 2015-09-19 DIAGNOSIS — O24419 Gestational diabetes mellitus in pregnancy, unspecified control: Secondary | ICD-10-CM

## 2015-09-19 DIAGNOSIS — O0990 Supervision of high risk pregnancy, unspecified, unspecified trimester: Secondary | ICD-10-CM

## 2015-09-19 LAB — POCT URINALYSIS DIP (DEVICE)
GLUCOSE, UA: NEGATIVE mg/dL
HGB URINE DIPSTICK: NEGATIVE
Ketones, ur: 40 mg/dL — AB
LEUKOCYTES UA: NEGATIVE
NITRITE: NEGATIVE
PROTEIN: NEGATIVE mg/dL
SPECIFIC GRAVITY, URINE: 1.025 (ref 1.005–1.030)
UROBILINOGEN UA: 1 mg/dL (ref 0.0–1.0)
pH: 6.5 (ref 5.0–8.0)

## 2015-09-19 LAB — HEMOGLOBIN A1C
Hgb A1c MFr Bld: 7.3 % — ABNORMAL HIGH (ref <5.7)
MEAN PLASMA GLUCOSE: 163 mg/dL — AB (ref <117)

## 2015-09-19 MED ORDER — GLYBURIDE 5 MG PO TABS: 7.5000 mg | ORAL_TABLET | Freq: Two times a day (BID) | ORAL | Status: DC

## 2015-09-19 NOTE — Progress Notes (Signed)
U/S scheduled for 10/14/2015 :15 am Optho referral to [facility]. Appointment scheduled on 10/03/2015 :15 AM

## 2015-09-19 NOTE — Progress Notes (Signed)
Pt complains of vaginal odor, no discharge Breastfeeding tip of the week reviewed

## 2015-09-19 NOTE — Progress Notes (Signed)
Subjective:  Monique Schmidt is a 30 y.o. [phone] at [redacted]w[redacted]d being seen today for ongoing prenatal care.  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  Patient reports increased vaginal discharge, no pain.  Contractions: Not present. Vag. Bleeding: None.  Movement: Present. Denies leaking of fluid.  Majority of fastings are > 90. Majority of PPs are > 120. Re-started meds one week ago. Glyburide 5 twice a day. Metfor  The following portions of the patient's history were reviewed and updated as appropriate: allergies, current medications, past family history, past medical history, past social history, past surgical history and problem list. Problem list updated.  Objective:   Filed Vitals:   09/19/15 1107  BP: 122/76  Pulse: 89  Temp: 98.8 F (37.1 C)  Weight: 320 lb (145.151 kg)    Fetal Status: Fetal Heart Rate (bpm): 155   Movement: Present     General:  Alert, oriented and cooperative. Patient is in no acute distress.  Skin: Skin is warm and dry. No rash noted.   Cardiovascular: Normal heart rate noted  Respiratory: Normal respiratory effort, no problems with respiration noted  Abdomen: Soft, gravid, appropriate for gestational age. Pain/Pressure: Present     Pelvic: Mod amount white discharge, no pooling, unable to visualize ervix  Extremities: Normal range of motion.  Edema: Trace  Mental Status: Normal mood and affect. Normal behavior. Normal judgment and thought content.   Urinalysis: Urine Protein: Negative Urine Glucose: Negative  Assessment and Plan:  Pregnancy: [id number] at  [redacted]w[redacted]d  # BDM - refused insulin transition - increase glyburide to 7.5 bid and continue metformin - DM educator one week - echo ordered - f/u u/s for growth 4 wks - a1c today  # LSIL pap - in 2011, had normal pap in 2014 but no co-test - attempted to repeat pap today but unable to locate cervix, no other MDs available to assist - will plan to attempt next visit  # Vaginal discharge - wet prep and gc today  Preterm labor symptoms and general obstetric precautions including but not limited to vaginal bleeding, contractions, leaking of fluid and fetal movement were reviewed in detail with the patient. Please refer to After Visit Summary for other counseling recommendations.  F/u 1 wk dm educator  Kathrynn Running, MD

## 2015-09-20 ENCOUNTER — Telehealth: Payer: Self-pay

## 2015-09-20 ENCOUNTER — Encounter: Payer: Self-pay | Admitting: Obstetrics and Gynecology

## 2015-09-20 ENCOUNTER — Telehealth: Payer: Self-pay | Admitting: General Practice

## 2015-09-20 DIAGNOSIS — B9689 Other specified bacterial agents as the cause of diseases classified elsewhere: Secondary | ICD-10-CM | POA: Insufficient documentation

## 2015-09-20 DIAGNOSIS — N760 Acute vaginitis: Secondary | ICD-10-CM

## 2015-09-20 LAB — GC/CHLAMYDIA PROBE AMP (~~LOC~~) NOT AT ARMC
CHLAMYDIA, DNA PROBE: NEGATIVE
NEISSERIA GONORRHEA: NEGATIVE

## 2015-09-20 LAB — WET PREP, GENITAL: Trich, Wet Prep: NONE SEEN

## 2015-09-20 LAB — OB RESULTS CONSOLE GC/CHLAMYDIA
Chlamydia: NEGATIVE
Gonorrhea: NEGATIVE

## 2015-09-20 MED ORDER — METRONIDAZOLE 500 MG PO TABS: 500.0000 mg | ORAL_TABLET | Freq: Two times a day (BID) | ORAL | Status: DC

## 2015-09-20 NOTE — Telephone Encounter (Signed)
Opened in error

## 2015-09-20 NOTE — Telephone Encounter (Signed)
Per Dr. Ashok Pall: Patient's wet prep shows BV and yeast. . I have sent Flagyl script to her pharmacy. She can take OTC monostat for yeast infection (I recommend the 7-day formulation).   I left a message for patient to call the clinics back.

## 2015-09-21 NOTE — Telephone Encounter (Signed)
Spoke with pt and informed her of wet prep result showing +BV and yeast.  Rx for Flagyl has been sent to her pharmacy and she may use the OTC Monistat (7 day formula) for the yeast.  Pt voiced understanding of all information and instructions given.

## 2015-09-26 ENCOUNTER — Ambulatory Visit: Payer: Medicaid Other | Admitting: *Deleted

## 2015-09-26 ENCOUNTER — Encounter: Payer: Medicaid Other | Attending: Family Medicine | Admitting: *Deleted

## 2015-09-26 DIAGNOSIS — O24919 Unspecified diabetes mellitus in pregnancy, unspecified trimester: Secondary | ICD-10-CM

## 2015-09-26 DIAGNOSIS — O24419 Gestational diabetes mellitus in pregnancy, unspecified control: Secondary | ICD-10-CM | POA: Insufficient documentation

## 2015-09-26 DIAGNOSIS — Z713 Dietary counseling and surveillance: Secondary | ICD-10-CM | POA: Insufficient documentation

## 2015-09-26 NOTE — Progress Notes (Signed)
Review of glucose readings: FBS 78-130, 2hpp Breakfast 79-166, Lunch 91-132, dinner 91-150 . Will increase Morning glyburide to 2 tablets in the AM and remain with 1 1/2 tablets in the PM. Will reevaluate medication needs in 1 week.

## 2015-09-28 NOTE — Progress Notes (Signed)
This encounter was created in error - please disregard.

## 2015-09-30 ENCOUNTER — Inpatient Hospital Stay (HOSPITAL_COMMUNITY)
Admission: AD | Admit: 2015-09-30 | Discharge: 2015-09-30 | Disposition: A | Discharge status: Home / Self Care | Payer: Medicaid Other | Source: Ambulatory Visit | Attending: Family Medicine | Admitting: Family Medicine

## 2015-09-30 ENCOUNTER — Encounter (HOSPITAL_COMMUNITY): Payer: Self-pay

## 2015-09-30 DIAGNOSIS — O24415 Gestational diabetes mellitus in pregnancy, controlled by oral hypoglycemic drugs: Secondary | ICD-10-CM | POA: Diagnosis not present

## 2015-09-30 DIAGNOSIS — Z91040 Latex allergy status: Secondary | ICD-10-CM | POA: Diagnosis not present

## 2015-09-30 DIAGNOSIS — Z0189 Encounter for other specified special examinations: Secondary | ICD-10-CM | POA: Insufficient documentation

## 2015-09-30 DIAGNOSIS — Z880 Allergy status to penicillin: Secondary | ICD-10-CM | POA: Insufficient documentation

## 2015-09-30 DIAGNOSIS — Z3A22 22 weeks gestation of pregnancy: Secondary | ICD-10-CM | POA: Diagnosis present

## 2015-09-30 DIAGNOSIS — Z7984 Long term (current) use of oral hypoglycemic drugs: Secondary | ICD-10-CM | POA: Diagnosis not present

## 2015-09-30 DIAGNOSIS — R102 Pelvic and perineal pain: Secondary | ICD-10-CM | POA: Insufficient documentation

## 2015-09-30 DIAGNOSIS — R109 Unspecified abdominal pain: Secondary | ICD-10-CM | POA: Diagnosis present

## 2015-09-30 DIAGNOSIS — Z9889 Other specified postprocedural states: Secondary | ICD-10-CM | POA: Diagnosis not present

## 2015-09-30 DIAGNOSIS — D696 Thrombocytopenia, unspecified: Secondary | ICD-10-CM | POA: Diagnosis not present

## 2015-09-30 DIAGNOSIS — Z7982 Long term (current) use of aspirin: Secondary | ICD-10-CM | POA: Diagnosis not present

## 2015-09-30 DIAGNOSIS — N949 Unspecified condition associated with female genital organs and menstrual cycle: Secondary | ICD-10-CM

## 2015-09-30 LAB — URINALYSIS, ROUTINE W REFLEX MICROSCOPIC
Bilirubin Urine: NEGATIVE
Glucose, UA: NEGATIVE mg/dL
Hgb urine dipstick: NEGATIVE
Ketones, ur: 15 mg/dL — AB
Leukocytes, UA: NEGATIVE
Nitrite: NEGATIVE
Protein, ur: NEGATIVE mg/dL
Specific Gravity, Urine: 1.02 (ref 1.005–1.030)
pH: 7.5 (ref 5.0–8.0)

## 2015-09-30 MED ORDER — CYCLOBENZAPRINE HCL 10 MG PO TABS: 10.0000 mg | ORAL_TABLET | Freq: Two times a day (BID) | ORAL | Status: DC | PRN

## 2015-09-30 NOTE — Discharge Instructions (Signed)
Please get a pregnancy support belt for your round ligament pain. You can also try Tylenol.   Round Ligament Pain The round ligament is a cord of muscle and tissue that helps to support the uterus. It can become a source of pain during pregnancy if it becomes stretched or twisted as the baby grows. The pain usually begins in the second trimester of pregnancy, and it can come and go until the baby is delivered. It is not a serious problem, and it does not cause harm to the baby. Round ligament pain is usually a short, sharp, and pinching pain, but it can also be a dull, lingering, and aching pain. The pain is felt in the lower side of the abdomen or in the groin. It usually starts deep in the groin and moves up to the outside of the hip area. Pain can occur with:  A sudden change in position.  Rolling over in bed.  Coughing or sneezing.  Physical activity. HOME CARE INSTRUCTIONS Watch your condition for any changes. Take these steps to help with your pain:  When the pain starts, relax. Then try:  Sitting down.  Flexing your knees up to your abdomen.  Lying on your side with one pillow under your abdomen and another pillow between your legs.  Sitting in a warm bath for 15-20 minutes or until the pain goes away.  Take over-the-counter and prescription medicines only as told by your health care provider.  Move slowly when you sit and stand.  Avoid long walks if they cause pain.  Stop or lessen your physical activities if they cause pain. SEEK MEDICAL CARE IF:  Your pain does not go away with treatment.  You feel pain in your back that you did not have before.  Your medicine is not helping. SEEK IMMEDIATE MEDICAL CARE IF:  You develop a fever or chills.  You develop uterine contractions.  You develop vaginal bleeding.  You develop nausea or vomiting.  You develop diarrhea.  You have pain when you urinate.   This information is not intended to replace advice given to  you by your health care provider. Make sure you discuss any questions you have with your health care provider.   Document Released: 05/15/2008 Document Revised: 10/29/2011 Document Reviewed: 10/13/2014 Elsevier Interactive Patient Education [employer]

## 2015-09-30 NOTE — MAU Note (Signed)
Notified Dr. Ottie Glazier (resident) on unit patient presents with lower abdominal pain worse when walking or standing, denies vaginal bleeding.

## 2015-09-30 NOTE — MAU Note (Signed)
Onset of lower abdominal pain since last night, worse this morning at work, states "I could hardly walk", denies vaginal bleeding.

## 2015-09-30 NOTE — MAU Provider Note (Signed)
History     CSN: [id number]  Arrival date and time: 09/30/15 1049   First Provider Initiated Contact with Patient 09/30/15 1111      Chief Complaint  Patient presents with  . Abdominal Pain   HPI  Monique Schmidt is a 30 yo [phone] at 82w3dpresenting with right groin pain. Pain is achy and intermittent. Pain started yesterday afternoon and is worse with standing and walking. Pain does not radiate. She also reports vaginal pressure that also started yesterday. Has not tried any OTC medications for pain. Denies changes in BM, dysuria, vaginal discharge, fevers/chills. Has been staying hydrated.   Past Medical History  Diagnosis Date  . Abnormal Pap smear   . Gestational diabetes     all pregnancies glyburide started 11/4  . [place]   . Headache(784.0)   . Vaginal Pap smear, abnormal   . Hernia, umbilical     Past Surgical History  Procedure Laterality Date  . Wisdom tooth extraction    . Umbilical hernia repair N/A 11/22/2014    Procedure: LAPAROSCOPIC UMBILICAL HERNIA;  Surgeon: ARalene Ok MD;  [facility]  Service: General;  Laterality: N/A;    Family History  Problem Relation Age of Onset  . Hypertension Father   . Diabetes Father   . Diabetes Mother   . Seizures Brother     Social History  Substance Use Topics  . Smoking status: Never Smoker   . Smokeless tobacco: Never Used  . Alcohol Use: No    Allergies:  Allergies  Allergen Reactions  . Latex Itching and Rash  . Penicillins Itching and Rash    Has patient had a PCN reaction causing immediate rash, facial/tongue/throat swelling, SOB or lightheadedness with hypotension: Yes Has patient had a PCN reaction causing severe rash involving mucus membranes or skin necrosis: Yes Has patient had a PCN reaction that required hospitalization No Has patient had a PCN reaction occurring within the last 10 years: Yes If all of the above answers are "NO", then may proceed with Cephalosporin use.      Prescriptions prior to admission  Medication Sig Dispense Refill Last Dose  . ACCU-CHEK FASTCLIX LANCETS MISC 1 Units by Percutaneous route 4 (four) times daily. 100 each 12 Taking  . aspirin 81 MG chewable tablet Chew 1 tablet (81 mg total) by mouth daily. 90 tablet 3 Taking  . Blood Glucose Monitoring Suppl (ACCU-CHEK NANO SMARTVIEW) W/DEVICE KIT 1 Device by Does not apply route as directed. Check fasting, and two hours after breakfast, lunch and dinner 1 kit 0 Taking  . butalbital-acetaminophen-caffeine (FIORICET) 50-325-40 MG tablet Take 1 tablet by mouth every 6 (six) hours as needed for headache. 20 tablet 0 Taking  . glucose blood (ACCU-CHEK SMARTVIEW) test strip Check blood sugars 4x/daily 100 each 12 Taking  . glyBURIDE (DIABETA) 5 MG tablet Take 1.5 tablets (7.5 mg total) by mouth 2 (two) times daily with a meal. 90 tablet 3   . metFORMIN (GLUCOPHAGE) 500 MG tablet Take 1 tablet (500 mg total) by mouth 2 (two) times daily with a meal. 30 tablet 1 Taking  . metroNIDAZOLE (FLAGYL) 500 MG tablet Take 1 tablet (500 mg total) by mouth 2 (two) times daily. 14 tablet 0   . Oxycodone HCl 10 MG TABS Reported on 09/19/2015  0 Not Taking  . Prenatal Multivit-Min-Fe-FA (PRENATAL VITAMINS) 0.8 MG tablet Take 1 tablet by mouth daily. 30 tablet 12 Taking  . promethazine (PHENERGAN) 25 MG tablet Take 1 tablet (25  mg total) by mouth every 6 (six) hours as needed for nausea. 42 tablet 2 Taking    ROS Physical Exam   Blood pressure 111/76, pulse 100, temperature 98 F (36.7 C), temperature source Oral, resp. rate 18, not currently breastfeeding.  Physical Exam Gen: NAD CV: RRR, no m/r/g Lungs: CTAB Abdomen: soft, nontender, gravid MSK: some tenderness to palpation of right upper gluteal region.  Cervical exam: closed and thick   MAU Course  Procedures UA unremarkable other than 15 ketones   Assessment and Plan  Symptoms consistent with round ligament pain. Encouraged to get pregnancy  support belt and Tylenol PRN. Return precautions discussed.   Smiley Houseman 09/30/2015, 11:50 AM   The patient was seen and examined by me also Agree with note FHR reassuring UCs: none Cervical exam closed/long Ready for discharge Pregnancy support belt discussed Will have her followup in clinic as scheduled Seabron Spates, CNM

## 2015-10-03 ENCOUNTER — Ambulatory Visit (INDEPENDENT_AMBULATORY_CARE_PROVIDER_SITE_OTHER): Payer: Medicaid Other | Admitting: Obstetrics and Gynecology

## 2015-10-03 ENCOUNTER — Encounter: Payer: Self-pay | Admitting: Obstetrics and Gynecology

## 2015-10-03 VITALS — BP 110/70 | HR 86 | Temp 97.8°F | Wt 328.6 lb

## 2015-10-03 DIAGNOSIS — D696 Thrombocytopenia, unspecified: Secondary | ICD-10-CM

## 2015-10-03 DIAGNOSIS — O99322 Drug use complicating pregnancy, second trimester: Secondary | ICD-10-CM | POA: Diagnosis not present

## 2015-10-03 DIAGNOSIS — O24912 Unspecified diabetes mellitus in pregnancy, second trimester: Secondary | ICD-10-CM

## 2015-10-03 DIAGNOSIS — O99112 Other diseases of the blood and blood-forming organs and certain disorders involving the immune mechanism complicating pregnancy, second trimester: Secondary | ICD-10-CM | POA: Diagnosis not present

## 2015-10-03 DIAGNOSIS — F1110 Opioid abuse, uncomplicated: Secondary | ICD-10-CM

## 2015-10-03 DIAGNOSIS — E669 Obesity, unspecified: Secondary | ICD-10-CM | POA: Diagnosis not present

## 2015-10-03 DIAGNOSIS — Z6841 Body Mass Index (BMI) 40.0 and over, adult: Secondary | ICD-10-CM | POA: Diagnosis not present

## 2015-10-03 DIAGNOSIS — O99212 Obesity complicating pregnancy, second trimester: Secondary | ICD-10-CM | POA: Diagnosis not present

## 2015-10-03 DIAGNOSIS — O0993 Supervision of high risk pregnancy, unspecified, third trimester: Secondary | ICD-10-CM

## 2015-10-03 LAB — POCT URINALYSIS DIP (DEVICE)
Bilirubin Urine: NEGATIVE
Glucose, UA: NEGATIVE mg/dL
Hgb urine dipstick: NEGATIVE
Ketones, ur: NEGATIVE mg/dL
Leukocytes, UA: NEGATIVE
Nitrite: NEGATIVE
Protein, ur: 30 mg/dL — AB
Specific Gravity, Urine: 1.025 (ref 1.005–1.030)
Urobilinogen, UA: 1 mg/dL (ref 0.0–1.0)
pH: 6.5 (ref 5.0–8.0)

## 2015-10-03 NOTE — Progress Notes (Signed)
Subjective:  Monique Schmidt is a 30 y.o. [phone] at [redacted]w[redacted]d being seen today for ongoing prenatal care.  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  Patient reports diarrhea for the past 2 days and has not tried any over the counter treatment.  Contractions: Not present. Vag. Bleeding: None.  Movement: Present. Denies leaking of fluid.   The following portions of the patient's history were reviewed and updated as appropriate: allergies, current medications, past family history, past medical history, past social history, past surgical history and problem list. Problem list updated.  Objective:   Filed Vitals:   10/03/15 1058  BP: 110/70  Pulse: 86  Temp: 97.8 F (36.6 C)  Weight: 328 lb 9.6 oz (149.052 kg)    Fetal Status: Fetal Heart Rate (bpm): 153   Movement: Present     General:  Alert, oriented and cooperative. Patient is in no acute distress.  Skin: Skin is warm and dry. No rash noted.   Cardiovascular: Normal heart rate noted  Respiratory: Normal respiratory effort, no problems with respiration noted  Abdomen: Soft, gravid, appropriate for gestational age. Pain/Pressure: Present     Pelvic: Vag. Bleeding: None     Cervical exam deferred        Extremities: Normal range of motion.  Edema: Trace  Mental Status: Normal mood and affect. Normal behavior. Normal judgment and thought content.   Urinalysis:      Assessment and Plan:  Pregnancy: [id number] at [redacted]w[redacted]d  1. Supervision of high-risk pregnancy, third trimester Patient reports diarrhea for the past 2 days. She denies any sick  contacts. Advised to try immodium and to stay well hydrated  2. Obesity in pregnancy, antepartum, second trimester   3. Diabetes mellitus in pregnancy, antepartum, second trimester Patient did not bring log book. She reports highest fasting 79 and highest 2 hr pp 123 Will continue current glyburide regimen Advised to bring log and meter with her at next appointment  4. BMI 50.0-59.9, adult (HCC)   5. Thrombocytopenia (HCC)   6. Narcotic use in pregnancy   Preterm labor symptoms and general obstetric precautions including but not limited to vaginal bleeding, contractions, leaking of fluid and fetal movement were reviewed in detail with the patient. Please refer to After Visit Summary for other counseling recommendations.  Return in about 3 weeks (around 10/24/2015).   Catalina Antigua, MD

## 2015-10-03 NOTE — Progress Notes (Signed)
Edema- feet   Pain- ligament and pelvic pain  Pt reports having diarrhea; has taken anything recommended immodium.  Pt given medications that can be taken in pregnancy

## 2015-10-03 NOTE — Patient Instructions (Signed)
Contraception Choices Contraception (birth control) is the use of any methods or devices to prevent pregnancy. Below are some methods to help avoid pregnancy. HORMONAL METHODS   Contraceptive implant. This is a thin, plastic tube containing progesterone hormone. It does not contain estrogen hormone. Your health care provider inserts the tube in the inner part of the upper arm. The tube can remain in place for up to 3 years. After 3 years, the implant must be removed. The implant prevents the ovaries from releasing an egg (ovulation), thickens the cervical mucus to prevent sperm from entering the uterus, and thins the lining of the inside of the uterus.  Progesterone-only injections. These injections are given every 3 months by your health care provider to prevent pregnancy. This synthetic progesterone hormone stops the ovaries from releasing eggs. It also thickens cervical mucus and changes the uterine lining. This makes it harder for sperm to survive in the uterus.  Birth control pills. These pills contain estrogen and progesterone hormone. They work by preventing the ovaries from releasing eggs (ovulation). They also cause the cervical mucus to thicken, preventing the sperm from entering the uterus. Birth control pills are prescribed by a health care provider.Birth control pills can also be used to treat heavy periods.  Minipill. This type of birth control pill contains only the progesterone hormone. They are taken every day of each month and must be prescribed by your health care provider.  Birth control patch. The patch contains hormones similar to those in birth control pills. It must be changed once a week and is prescribed by a health care provider.  Vaginal ring. The ring contains hormones similar to those in birth control pills. It is left in the vagina for 3 weeks, removed for 1 week, and then a new one is put back in place. The patient must be comfortable inserting and removing the ring  from the vagina.A health care provider's prescription is necessary.  Emergency contraception. Emergency contraceptives prevent pregnancy after unprotected sexual intercourse. This pill can be taken right after sex or up to 5 days after unprotected sex. It is most effective the sooner you take the pills after having sexual intercourse. Most emergency contraceptive pills are available without a prescription. Check with your pharmacist. Do not use emergency contraception as your only form of birth control. BARRIER METHODS   Female condom. This is a thin sheath (latex or rubber) that is worn over the penis during sexual intercourse. It can be used with spermicide to increase effectiveness.  Female condom. This is a soft, loose-fitting sheath that is put into the vagina before sexual intercourse.  Diaphragm. This is a soft, latex, dome-shaped barrier that must be fitted by a health care provider. It is inserted into the vagina, along with a spermicidal jelly. It is inserted before intercourse. The diaphragm should be left in the vagina for 6 to 8 hours after intercourse.  Cervical cap. This is a round, soft, latex or plastic cup that fits over the cervix and must be fitted by a health care provider. The cap can be left in place for up to 48 hours after intercourse.  Sponge. This is a soft, circular piece of polyurethane foam. The sponge has spermicide in it. It is inserted into the vagina after wetting it and before sexual intercourse.  Spermicides. These are chemicals that kill or block sperm from entering the cervix and uterus. They come in the form of creams, jellies, suppositories, foam, or tablets. They do not require a   prescription. They are inserted into the vagina with an applicator before having sexual intercourse. The process must be repeated every time you have sexual intercourse. INTRAUTERINE CONTRACEPTION  Intrauterine device (IUD). This is a T-shaped device that is put in a woman's uterus  during a menstrual period to prevent pregnancy. There are 2 types:  Copper IUD. This type of IUD is wrapped in copper wire and is placed inside the uterus. Copper makes the uterus and fallopian tubes produce a fluid that kills sperm. It can stay in place for 10 years.  Hormone IUD. This type of IUD contains the hormone progestin (synthetic progesterone). The hormone thickens the cervical mucus and prevents sperm from entering the uterus, and it also thins the uterine lining to prevent implantation of a fertilized egg. The hormone can weaken or kill the sperm that get into the uterus. It can stay in place for 3-5 years, depending on which type of IUD is used. PERMANENT METHODS OF CONTRACEPTION  Female tubal ligation. This is when the woman's fallopian tubes are surgically sealed, tied, or blocked to prevent the egg from traveling to the uterus.  Hysteroscopic sterilization. This involves placing a small coil or insert into each fallopian tube. Your doctor uses a technique called hysteroscopy to do the procedure. The device causes scar tissue to form. This results in permanent blockage of the fallopian tubes, so the sperm cannot fertilize the egg. It takes about 3 months after the procedure for the tubes to become blocked. You must use another form of birth control for these 3 months.  Female sterilization. This is when the female has the tubes that carry sperm tied off (vasectomy).This blocks sperm from entering the vagina during sexual intercourse. After the procedure, the man can still ejaculate fluid (semen). NATURAL PLANNING METHODS  Natural family planning. This is not having sexual intercourse or using a barrier method (condom, diaphragm, cervical cap) on days the woman could become pregnant.  Calendar method. This is keeping track of the length of each menstrual cycle and identifying when you are fertile.  Ovulation method. This is avoiding sexual intercourse during ovulation.  Symptothermal  method. This is avoiding sexual intercourse during ovulation, using a thermometer and ovulation symptoms.  Post-ovulation method. This is timing sexual intercourse after you have ovulated. Regardless of which type or method of contraception you choose, it is important that you use condoms to protect against the transmission of sexually transmitted infections (STIs). Talk with your health care provider about which form of contraception is most appropriate for you.   This information is not intended to replace advice given to you by your health care provider. Make sure you discuss any questions you have with your health care provider.   Document Released: 08/06/2005 Document Revised: 08/11/2013 Document Reviewed: 01/29/2013 Elsevier Interactive Patient Education 2016 [employer]  

## 2015-10-10 ENCOUNTER — Encounter: Payer: Medicaid Other | Admitting: Obstetrics and Gynecology

## 2015-10-14 ENCOUNTER — Ambulatory Visit (HOSPITAL_COMMUNITY)
Admission: RE | Admit: 2015-10-14 | Discharge: 2015-10-14 | Disposition: A | Discharge status: Home / Self Care | Payer: Medicaid Other | Source: Ambulatory Visit | Attending: Obstetrics and Gynecology | Admitting: Obstetrics and Gynecology

## 2015-10-14 ENCOUNTER — Other Ambulatory Visit: Payer: Self-pay | Admitting: Obstetrics and Gynecology

## 2015-10-14 ENCOUNTER — Encounter (HOSPITAL_COMMUNITY): Payer: Self-pay

## 2015-10-14 VITALS — BP 103/73 | HR 94 | Wt 323.0 lb

## 2015-10-14 DIAGNOSIS — Z3A24 24 weeks gestation of pregnancy: Secondary | ICD-10-CM | POA: Diagnosis not present

## 2015-10-14 DIAGNOSIS — O99212 Obesity complicating pregnancy, second trimester: Secondary | ICD-10-CM | POA: Diagnosis not present

## 2015-10-14 DIAGNOSIS — O24112 Pre-existing diabetes mellitus, type 2, in pregnancy, second trimester: Secondary | ICD-10-CM | POA: Insufficient documentation

## 2015-10-14 DIAGNOSIS — Z8632 Personal history of gestational diabetes: Secondary | ICD-10-CM

## 2015-10-14 DIAGNOSIS — O24919 Unspecified diabetes mellitus in pregnancy, unspecified trimester: Secondary | ICD-10-CM

## 2015-10-14 DIAGNOSIS — O09292 Supervision of pregnancy with other poor reproductive or obstetric history, second trimester: Secondary | ICD-10-CM | POA: Diagnosis not present

## 2015-10-14 DIAGNOSIS — O0990 Supervision of high risk pregnancy, unspecified, unspecified trimester: Secondary | ICD-10-CM

## 2015-10-17 ENCOUNTER — Ambulatory Visit (INDEPENDENT_AMBULATORY_CARE_PROVIDER_SITE_OTHER): Payer: Medicaid Other | Admitting: Obstetrics & Gynecology

## 2015-10-17 VITALS — BP 112/72 | HR 97 | Temp 98.5°F | Wt 323.7 lb

## 2015-10-17 DIAGNOSIS — E669 Obesity, unspecified: Secondary | ICD-10-CM | POA: Diagnosis not present

## 2015-10-17 DIAGNOSIS — O24912 Unspecified diabetes mellitus in pregnancy, second trimester: Secondary | ICD-10-CM

## 2015-10-17 DIAGNOSIS — O99212 Obesity complicating pregnancy, second trimester: Secondary | ICD-10-CM | POA: Diagnosis not present

## 2015-10-17 NOTE — Patient Instructions (Signed)
Gestational Diabetes Mellitus  Gestational diabetes mellitus, often simply referred to as gestational diabetes, is a type of diabetes that some women develop during pregnancy. In gestational diabetes, the pancreas does not make enough insulin (a hormone), the cells are less responsive to the insulin that is made (insulin resistance), or both. Normally, insulin moves sugars from food into the tissue cells. The tissue cells use the sugars for energy. The lack of insulin or the lack of normal response to insulin causes excess sugars to build up in the blood instead of going into the tissue cells. As a result, high blood sugar (hyperglycemia) develops. The effect of high sugar (glucose) levels can cause many problems.   RISK FACTORS  You have an increased chance of developing gestational diabetes if you have a family history of diabetes and also have one or more of the following risk factors:  · A body mass index over 30 (obesity).  · A previous pregnancy with gestational diabetes.  · An older age at the time of pregnancy.  If blood glucose levels are kept in the normal range during pregnancy, women can have a healthy pregnancy. If your blood glucose levels are not well controlled, there may be risks to you, your unborn baby (fetus), your labor and delivery, or your newborn baby.   SYMPTOMS   If symptoms are experienced, they are much like symptoms you would normally expect during pregnancy. The symptoms of gestational diabetes include:   · Increased thirst (polydipsia).  · Increased urination (polyuria).  · Increased urination during the night (nocturia).  · Weight loss. This weight loss may be rapid.  · Frequent, recurring infections.  · Tiredness (fatigue).  · Weakness.  · Vision changes, such as blurred vision.  · Fruity smell to your breath.  · Abdominal pain.  DIAGNOSIS  Diabetes is diagnosed when blood glucose levels are increased. Your blood glucose level may be checked by one or more of the following blood  tests:  · A fasting blood glucose test. You will not be allowed to eat for at least 8 hours before a blood sample is taken.  · A random blood glucose test. Your blood glucose is checked at any time of the day regardless of when you ate.  · An oral glucose tolerance test (OGTT). Your blood glucose is measured after you have not eaten (fasted) for 1-3 hours and then after you drink a glucose-containing beverage. Since the hormones that cause insulin resistance are highest at about 24-28 weeks of a pregnancy, an OGTT is usually performed during that time. If you have risk factors, you may be screened for undiagnosed type 2 diabetes at your first prenatal visit.  TREATMENT   Gestational diabetes should be managed first with diet and exercise. Medicines may be added only if they are needed.  · You will need to take diabetes medicine or insulin daily to keep blood glucose levels in the desired range.  · You will need to match insulin dosing with exercise and healthy food choices.  If you have gestational diabetes, your treatment goal is to maintain the following blood glucose levels:  · Before meals (preprandial): at or below 95 mg/dL.  · After meals (postprandial):    One hour after a meal: at or below 140 mg/dL.    Two hours after a meal: at or below 120 mg/dL.  If you have pre-existing type 1 or type 2 diabetes, your treatment goal is to maintain the following blood glucose levels:  · Before   meals, at bedtime, and overnight: 60-99 mg/dL.  · After meals: peak of 100-129 mg/dL.  HOME CARE INSTRUCTIONS   · Have your hemoglobin A1c level checked twice a year.  · Perform daily blood glucose monitoring as directed by your health care provider. It is common to perform frequent blood glucose monitoring.  · Monitor urine ketones when you are ill and as directed by your health care provider.  · Take your diabetes medicine and insulin as directed by your health care provider to maintain your blood glucose level in the desired  range.  ¨ Never run out of diabetes medicine or insulin. It is needed every day.  ¨ Adjust insulin based on your intake of carbohydrates. Carbohydrates can raise blood glucose levels but need to be included in your diet. Carbohydrates provide vitamins, minerals, and fiber which are an essential part of a healthy diet. Carbohydrates are found in fruits, vegetables, whole grains, dairy products, legumes, and foods containing added sugars.  · Eat healthy foods. Alternate 3 meals with 3 snacks.  · Maintain a healthy weight gain. The usual total expected weight gain varies according to your prepregnancy body mass index (BMI).  · Carry a medical alert card or wear your medical alert jewelry.  · Carry a 15-gram carbohydrate snack with you at all times to treat low blood glucose (hypoglycemia). Some examples of 15-gram carbohydrate snacks include:  ¨ Glucose tablets, 3 or 4.  ¨ Glucose gel, 15-gram tube.  ¨ Raisins, 2 tablespoons (24 g).  ¨ Jelly beans, 6.  ¨ Animal crackers, 8.  ¨ Fruit juice, regular soda, or low-fat milk, 4 ounces (120 mL).  ¨ Gummy treats, 9.  · Recognize hypoglycemia. Hypoglycemia during pregnancy occurs with blood glucose levels of 60 mg/dL and below. The risk for hypoglycemia increases when fasting or skipping meals, during or after intense exercise, and during sleep. Hypoglycemia symptoms can include:  ¨ Tremors or shakes.  ¨ Decreased ability to concentrate.  ¨ Sweating.  ¨ Increased heart rate.  ¨ Headache.  ¨ Dry mouth.  ¨ Hunger.  ¨ Irritability.  ¨ Anxiety.  ¨ Restless sleep.  ¨ Altered speech or coordination.  ¨ Confusion.  · Treat hypoglycemia promptly. If you are alert and able to safely swallow, follow the 15:15 rule:  ¨ Take 15-20 grams of rapid-acting glucose or carbohydrate. Rapid-acting options include glucose gel, glucose tablets, or 4 ounces (120 mL) of fruit juice, regular soda, or low-fat milk.  ¨ Check your blood glucose level 15 minutes after taking the glucose.  ¨ Take 15-20  grams more of glucose if the repeat blood glucose level is still 70 mg/dL or below.  ¨ Eat a meal or snack within 1 hour once blood glucose levels return to normal.  · Be alert to polyuria (excess urination) and polydipsia (excess thirst) which are early signs of hyperglycemia. An early awareness of hyperglycemia allows for prompt treatment. Treat hyperglycemia as directed by your health care provider.  · Engage in at least 30 minutes of physical activity a day or as directed by your health care provider. Ten minutes of physical activity timed 30 minutes after each meal is encouraged to control postprandial blood glucose levels.  · Adjust your insulin dosing and food intake as needed if you start a new exercise or sport.  · Follow your sick-day plan at any time you are unable to eat or drink as usual.  · Avoid tobacco and alcohol use.  · Keep all follow-up visits as directed   by your health care provider.  · Follow the advice of your health care provider regarding your prenatal and post-delivery (postpartum) appointments, meal planning, exercise, medicines, vitamins, blood tests, other medical tests, and physical activities.  · Perform daily skin and foot care. Examine your skin and feet daily for cuts, bruises, redness, nail problems, bleeding, blisters, or sores.  · Brush your teeth and gums at least twice a day and floss at least once a day. Follow up with your dentist regularly.  · Schedule an eye exam during the first trimester of your pregnancy or as directed by your health care provider.  · Share your diabetes management plan with your workplace or school.  · Stay up-to-date with immunizations.  · Learn to manage stress.  · Obtain ongoing diabetes education and support as needed.  · Learn about and consider breastfeeding your baby.  · You should have your blood sugar level checked 6-12 weeks after delivery. This is done with an oral glucose tolerance test (OGTT).  SEEK MEDICAL CARE IF:   · You are unable to  eat food or drink fluids for more than 6 hours.  · You have nausea and vomiting for more than 6 hours.  · You have a blood glucose level of 200 mg/dL and you have ketones in your urine.  · There is a change in mental status.  · You develop vision problems.  · You have a persistent headache.  · You have upper abdominal pain or discomfort.  · You develop an additional serious illness.  · You have diarrhea for more than 6 hours.  · You have been sick or have had a fever for a couple of days and are not getting better.  SEEK IMMEDIATE MEDICAL CARE IF:   · You have difficulty breathing.  · You no longer feel the baby moving.  · You are bleeding or have discharge from your vagina.  · You start having premature contractions or labor.  MAKE SURE YOU:  · Understand these instructions.  · Will watch your condition.  · Will get help right away if you are not doing well or get worse.     This information is not intended to replace advice given to you by your health care provider. Make sure you discuss any questions you have with your health care provider.     Document Released: 11/12/2000 Document Revised: 08/27/2014 Document Reviewed: 03/04/2012  Elsevier Interactive Patient Education ©2016 [employer]

## 2015-10-17 NOTE — Progress Notes (Signed)
Breastfeeding tip of the week reviewed. 

## 2015-10-17 NOTE — Progress Notes (Signed)
Subjective:  Monique Schmidt is a 30 y.o. [phone] at [redacted]w[redacted]d being seen today for ongoing prenatal care.  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  Patient reports low abdominal pain.  Contractions: Not present. Vag. Bleeding: None.  Movement: Present. Denies leaking of fluid.   The following portions of the patient's history were reviewed and updated as appropriate: allergies, current medications, past family history, past medical history, past social history, past surgical history and problem list. Problem list updated.  Objective:   Filed Vitals:   10/17/15 1142  BP: 112/72  Pulse: 97  Temp: 98.5 F (36.9 C)  Weight: 323 lb 11.2 oz (146.829 kg)    Fetal Status: Fetal Heart Rate (bpm): 156   Movement: Present     General:  Alert, oriented and cooperative. Patient is in no acute distress.  Skin: Skin is warm and dry. No rash noted.   Cardiovascular: Normal heart rate noted  Respiratory: Normal respiratory effort, no problems with respiration noted  Abdomen: Soft, gravid, appropriate for gestational age. Pain/Pressure: Present     Pelvic: Vag. Bleeding: None     Cervical exam deferred        Extremities: Normal range of motion.  Edema: Trace  Mental Status: Normal mood and affect. Normal behavior. Normal judgment and thought content.   Urinalysis:      Assessment and Plan:  Pregnancy: [id number] at [redacted]w[redacted]d  1. Diabetes mellitus in pregnancy, antepartum, second trimester FBS and PP usually nl most<120, 171 FBS today Continue present dose glyburide Preterm labor symptoms and general  obstetric precautions including but not limited to vaginal bleeding, contractions, leaking of fluid and fetal movement were reviewed in detail with the patient. Please refer to After Visit Summary for other counseling recommendations.  Return in about 2 weeks (around 10/31/2015).   Adam Phenix, MD

## 2015-10-24 ENCOUNTER — Ambulatory Visit (INDEPENDENT_AMBULATORY_CARE_PROVIDER_SITE_OTHER): Payer: Medicaid Other | Admitting: Family Medicine

## 2015-10-24 VITALS — BP 101/59 | HR 92 | Temp 97.8°F | Wt 325.7 lb

## 2015-10-24 DIAGNOSIS — D696 Thrombocytopenia, unspecified: Secondary | ICD-10-CM

## 2015-10-24 DIAGNOSIS — O24912 Unspecified diabetes mellitus in pregnancy, second trimester: Secondary | ICD-10-CM | POA: Diagnosis present

## 2015-10-24 DIAGNOSIS — O0992 Supervision of high risk pregnancy, unspecified, second trimester: Secondary | ICD-10-CM

## 2015-10-24 DIAGNOSIS — O99112 Other diseases of the blood and blood-forming organs and certain disorders involving the immune mechanism complicating pregnancy, second trimester: Secondary | ICD-10-CM

## 2015-10-24 NOTE — Patient Instructions (Signed)
Gestational Diabetes Mellitus Gestational diabetes mellitus, often simply referred to as gestational diabetes, is a type of diabetes that some women develop during pregnancy. In gestational diabetes, the pancreas does not make enough insulin (a hormone), the cells are less responsive to the insulin that is made (insulin resistance), or both.Normally, insulin moves sugars from food into the tissue cells. The tissue cells use the sugars for energy. The lack of insulin or the lack of normal response to insulin causes excess sugars to build up in the blood instead of going into the tissue cells. As a result, high blood sugar (hyperglycemia) develops. The effect of high sugar (glucose) levels can cause many problems.  RISK FACTORS You have an increased chance of developing gestational diabetes if you have a family history of diabetes and also have one or more of the following risk factors:  A body mass index over 30 (obesity).  A previous pregnancy with gestational diabetes.  An older age at the time of pregnancy. If blood glucose levels are kept in the normal range during pregnancy, women can have a healthy pregnancy. If your blood glucose levels are not well controlled, there may be risks to you, your unborn baby (fetus), your labor and delivery, or your newborn baby.  SYMPTOMS  If symptoms are experienced, they are much like symptoms you would normally expect during pregnancy. The symptoms of gestational diabetes include:   Increased thirst (polydipsia).  Increased urination (polyuria).  Increased urination during the night (nocturia).  Weight loss. This weight loss may be rapid.  Frequent, recurring infections.  Tiredness (fatigue).  Weakness.  Vision changes, such as blurred vision.  Fruity smell to your breath.  Abdominal pain. DIAGNOSIS Diabetes is diagnosed when blood glucose levels are increased. Your blood glucose level may be checked by one or more of the following blood  tests:  A fasting blood glucose test. You will not be allowed to eat for at least 8 hours before a blood sample is taken.  A random blood glucose test. Your blood glucose is checked at any time of the day regardless of when you ate.  An oral glucose tolerance test (OGTT). Your blood glucose is measured after you have not eaten (fasted) for 1-3 hours and then after you drink a glucose-containing beverage. Since the hormones that cause insulin resistance are highest at about 24-28 weeks of a pregnancy, an OGTT is usually performed during that time. If you have risk factors, you may be screened for undiagnosed type 2 diabetes at your first prenatal visit. TREATMENT  Gestational diabetes should be managed first with diet and exercise. Medicines may be added only if they are needed.  You will need to take diabetes medicine or insulin daily to keep blood glucose levels in the desired range.  You will need to match insulin dosing with exercise and healthy food choices. If you have gestational diabetes, your treatment goal is to maintain the following blood glucose levels:  Before meals (preprandial): at or below 95 mg/dL.  After meals (postprandial):  One hour after a meal: at or below 140 mg/dL.  Two hours after a meal: at or below 120 mg/dL. If you have pre-existing type 1 or type 2 diabetes, your treatment goal is to maintain the following blood glucose levels:  Before meals, at bedtime, and overnight: 60-99 mg/dL.  After meals: peak of 100-129 mg/dL. HOME CARE INSTRUCTIONS   Have your hemoglobin A1c level checked twice a year.  Perform daily blood glucose monitoring as directed by   your health care provider. It is common to perform frequent blood glucose monitoring.  Monitor urine ketones when you are ill and as directed by your health care provider.  Take your diabetes medicine and insulin as directed by your health care provider to maintain your blood glucose level in the desired  range.  Never run out of diabetes medicine or insulin. It is needed every day.  Adjust insulin based on your intake of carbohydrates. Carbohydrates can raise blood glucose levels but need to be included in your diet. Carbohydrates provide vitamins, minerals, and fiber which are an essential part of a healthy diet. Carbohydrates are found in fruits, vegetables, whole grains, dairy products, legumes, and foods containing added sugars.  Eat healthy foods. Alternate 3 meals with 3 snacks.  Maintain a healthy weight gain. The usual total expected weight gain varies according to your prepregnancy body mass index (BMI).  Carry a medical alert card or wear your medical alert jewelry.  Carry a 15-gram carbohydrate snack with you at all times to treat low blood glucose (hypoglycemia). Some examples of 15-gram carbohydrate snacks include:  Glucose tablets, 3 or 4.  Glucose gel, 15-gram tube.  Raisins, 2 tablespoons (24 g).  Jelly beans, 6.  Animal crackers, 8.  Fruit juice, regular soda, or low-fat milk, 4 ounces (120 mL).  Gummy treats, 9.  Recognize hypoglycemia. Hypoglycemia during pregnancy occurs with blood glucose levels of 60 mg/dL and below. The risk for hypoglycemia increases when fasting or skipping meals, during or after intense exercise, and during sleep. Hypoglycemia symptoms can include:  Tremors or shakes.  Decreased ability to concentrate.  Sweating.  Increased heart rate.  Headache.  Dry mouth.  Hunger.  Irritability.  Anxiety.  Restless sleep.  Altered speech or coordination.  Confusion.  Treat hypoglycemia promptly. If you are alert and able to safely swallow, follow the 15:15 rule:  Take 15-20 grams of rapid-acting glucose or carbohydrate. Rapid-acting options include glucose gel, glucose tablets, or 4 ounces (120 mL) of fruit juice, regular soda, or low-fat milk.  Check your blood glucose level 15 minutes after taking the glucose.  Take 15-20  grams more of glucose if the repeat blood glucose level is still 70 mg/dL or below.  Eat a meal or snack within 1 hour once blood glucose levels return to normal.  Be alert to polyuria (excess urination) and polydipsia (excess thirst) which are early signs of hyperglycemia. An early awareness of hyperglycemia allows for prompt treatment. Treat hyperglycemia as directed by your health care provider.  Engage in at least 30 minutes of physical activity a day or as directed by your health care provider. Ten minutes of physical activity timed 30 minutes after each meal is encouraged to control postprandial blood glucose levels.  Adjust your insulin dosing and food intake as needed if you start a new exercise or sport.  Follow your sick-day plan at any time you are unable to eat or drink as usual.  Avoid tobacco and alcohol use.  Keep all follow-up visits as directed by your health care provider.  Follow the advice of your health care provider regarding your prenatal and post-delivery (postpartum) appointments, meal planning, exercise, medicines, vitamins, blood tests, other medical tests, and physical activities.  Perform daily skin and foot care. Examine your skin and feet daily for cuts, bruises, redness, nail problems, bleeding, blisters, or sores.  Brush your teeth and gums at least twice a day and floss at least once a day. Follow up with your dentist   regularly.  Schedule an eye exam during the first trimester of your pregnancy or as directed by your health care provider.  Share your diabetes management plan with your workplace or school.  Stay up-to-date with immunizations.  Learn to manage stress.  Obtain ongoing diabetes education and support as needed.  Learn about and consider breastfeeding your baby.  You should have your blood sugar level checked 6-12 weeks after delivery. This is done with an oral glucose tolerance test (OGTT). SEEK MEDICAL CARE IF:   You are unable to  eat food or drink fluids for more than 6 hours.  You have nausea and vomiting for more than 6 hours.  You have a blood glucose level of 200 mg/dL and you have ketones in your urine.  There is a change in mental status.  You develop vision problems.  You have a persistent headache.  You have upper abdominal pain or discomfort.  You develop an additional serious illness.  You have diarrhea for more than 6 hours.  You have been sick or have had a fever for a couple of days and are not getting better. SEEK IMMEDIATE MEDICAL CARE IF:   You have difficulty breathing.  You no longer feel the baby moving.  You are bleeding or have discharge from your vagina.  You start having premature contractions or labor. MAKE SURE YOU:  Understand these instructions.  Will watch your condition.  Will get help right away if you are not doing well or get worse.   This information is not intended to replace advice given to you by your health care provider. Make sure you discuss any questions you have with your health care provider.   Document Released: 11/12/2000 Document Revised: 08/27/2014 Document Reviewed: 03/04/2012 [employer] Interactive Patient Education 2016 [employer]  Breastfeeding Deciding to breastfeed is one of the best choices you can make for you and your baby. A change in hormones during pregnancy causes your breast tissue to grow and increases the number and size of your milk ducts. These hormones also allow proteins, sugars, and fats from your blood supply to make breast milk in your milk-producing glands. Hormones prevent breast milk from being released before your baby is born as well as prompt milk flow after birth. Once breastfeeding has begun, thoughts of your baby, as well as his or her sucking or crying, can stimulate the release of milk from your milk-producing glands.  BENEFITS OF BREASTFEEDING For Your Baby  Your first milk (colostrum) helps your baby's digestive  system function better.  There are antibodies in your milk that help your baby fight off infections.  Your baby has a lower incidence of asthma, allergies, and sudden infant death syndrome.  The nutrients in breast milk are better for your baby than infant formulas and are designed uniquely for your baby's needs.  Breast milk improves your baby's brain development.  Your baby is less likely to develop other conditions, such as childhood obesity, asthma, or type 2 diabetes mellitus. For You  Breastfeeding helps to create a very special bond between you and your baby.  Breastfeeding is convenient. Breast milk is always available at the correct temperature and costs nothing.  Breastfeeding helps to burn calories and helps you lose the weight gained during pregnancy.  Breastfeeding makes your uterus contract to its prepregnancy size faster and slows bleeding (lochia) after you give birth.   Breastfeeding helps to lower your risk of developing type 2 diabetes mellitus, osteoporosis, and breast or ovarian cancer   later in life. SIGNS THAT YOUR BABY IS HUNGRY Early Signs of Hunger  Increased alertness or activity.  Stretching.  Movement of the head from side to side.  Movement of the head and opening of the mouth when the corner of the mouth or cheek is stroked (rooting).  Increased sucking sounds, smacking lips, cooing, sighing, or squeaking.  Hand-to-mouth movements.  Increased sucking of fingers or hands. Late Signs of Hunger  Fussing.  Intermittent crying. Extreme Signs of Hunger Signs of extreme hunger will require calming and consoling before your baby will be able to breastfeed successfully. Do not wait for the following signs of extreme hunger to occur before you initiate breastfeeding:  Restlessness.  A loud, strong cry.  Screaming. BREASTFEEDING BASICS Breastfeeding Initiation  Find a comfortable place to sit or lie down, with your neck and back well  supported.  Place a pillow or rolled up blanket under your baby to bring him or her to the level of your breast (if you are seated). Nursing pillows are specially designed to help support your arms and your baby while you breastfeed.  Make sure that your baby's abdomen is facing your abdomen.  Gently massage your breast. With your fingertips, massage from your chest wall toward your nipple in a circular motion. This encourages milk flow. You may need to continue this action during the feeding if your milk flows slowly.  Support your breast with 4 fingers underneath and your thumb above your nipple. Make sure your fingers are well away from your nipple and your baby's mouth.  Stroke your baby's lips gently with your finger or nipple.  When your baby's mouth is open wide enough, quickly bring your baby to your breast, placing your entire nipple and as much of the colored area around your nipple (areola) as possible into your baby's mouth.  More areola should be visible above your baby's upper lip than below the lower lip.  Your baby's tongue should be between his or her lower gum and your breast.  Ensure that your baby's mouth is correctly positioned around your nipple (latched). Your baby's lips should create a seal on your breast and be turned out (everted).  It is common for your baby to suck about 2-3 minutes in order to start the flow of breast milk. Latching Teaching your baby how to latch on to your breast properly is very important. An improper latch can cause nipple pain and decreased milk supply for you and poor weight gain in your baby. Also, if your baby is not latched onto your nipple properly, he or she may swallow some air during feeding. This can make your baby fussy. Burping your baby when you switch breasts during the feeding can help to get rid of the air. However, teaching your baby to latch on properly is still the best way to prevent fussiness from swallowing air while  breastfeeding. Signs that your baby has successfully latched on to your nipple:  Silent tugging or silent sucking, without causing you pain.  Swallowing heard between every 3-4 sucks.  Muscle movement above and in front of his or her ears while sucking. Signs that your baby has not successfully latched on to nipple:  Sucking sounds or smacking sounds from your baby while breastfeeding.  Nipple pain. If you think your baby has not latched on correctly, slip your finger into the corner of your baby's mouth to break the suction and place it between your baby's gums. Attempt breastfeeding initiation again. Signs   of Successful Breastfeeding Signs from your baby:  A gradual decrease in the number of sucks or complete cessation of sucking.  Falling asleep.  Relaxation of his or her body.  Retention of a small amount of milk in his or her mouth.  Letting go of your breast by himself or herself. Signs from you:  Breasts that have increased in firmness, weight, and size 1-3 hours after feeding.  Breasts that are softer immediately after breastfeeding.  Increased milk volume, as well as a change in milk consistency and color by the fifth day of breastfeeding.  Nipples that are not sore, cracked, or bleeding. Signs That Your Baby is Getting Enough Milk  Wetting at least 3 diapers in a 24-hour period. The urine should be clear and pale yellow by age 5 days.  At least 3 stools in a 24-hour period by age 5 days. The stool should be soft and yellow.  At least 3 stools in a 24-hour period by age 7 days. The stool should be seedy and yellow.  No loss of weight greater than 10% of birth weight during the first 3 days of age.  Average weight gain of 4-7 ounces (113-198 g) per week after age 4 days.  Consistent daily weight gain by age 5 days, without weight loss after the age of 2 weeks. After a feeding, your baby may spit up a small amount. This is common. BREASTFEEDING FREQUENCY AND  DURATION Frequent feeding will help you make more milk and can prevent sore nipples and breast engorgement. Breastfeed when you feel the need to reduce the fullness of your breasts or when your baby shows signs of hunger. This is called "breastfeeding on demand." Avoid introducing a pacifier to your baby while you are working to establish breastfeeding (the first 4-6 weeks after your baby is born). After this time you may choose to use a pacifier. Research has shown that pacifier use during the first year of a baby's life decreases the risk of sudden infant death syndrome (SIDS). Allow your baby to feed on each breast as long as he or she wants. Breastfeed until your baby is finished feeding. When your baby unlatches or falls asleep while feeding from the first breast, offer the second breast. Because newborns are often sleepy in the first few weeks of life, you may need to awaken your baby to get him or her to feed. Breastfeeding times will vary from baby to baby. However, the following rules can serve as a guide to help you ensure that your baby is properly fed:  Newborns (babies 4 weeks of age or younger) may breastfeed every 1-3 hours.  Newborns should not go longer than 3 hours during the day or 5 hours during the night without breastfeeding.  You should breastfeed your baby a minimum of 8 times in a 24-hour period until you begin to introduce solid foods to your baby at around 6 months of age. BREAST MILK PUMPING Pumping and storing breast milk allows you to ensure that your baby is exclusively fed your breast milk, even at times when you are unable to breastfeed. This is especially important if you are going back to work while you are still breastfeeding or when you are not able to be present during feedings. Your lactation consultant can give you guidelines on how long it is safe to store breast milk. A breast pump is a machine that allows you to pump milk from your breast into a sterile bottle.  The pumped   breast milk can then be stored in a refrigerator or freezer. Some breast pumps are operated by hand, while others use electricity. Ask your lactation consultant which type will work best for you. Breast pumps can be purchased, but some hospitals and breastfeeding support groups lease breast pumps on a monthly basis. A lactation consultant can teach you how to hand express breast milk, if you prefer not to use a pump. CARING FOR YOUR BREASTS WHILE YOU BREASTFEED Nipples can become dry, cracked, and sore while breastfeeding. The following recommendations can help keep your breasts moisturized and healthy:  Avoid using soap on your nipples.  Wear a supportive bra. Although not required, special nursing bras and tank tops are designed to allow access to your breasts for breastfeeding without taking off your entire bra or top. Avoid wearing underwire-style bras or extremely tight bras.  Air dry your nipples for 3-4minutes after each feeding.  Use only cotton bra pads to absorb leaked breast milk. Leaking of breast milk between feedings is normal.  Use lanolin on your nipples after breastfeeding. Lanolin helps to maintain your skin's normal moisture barrier. If you use pure lanolin, you do not need to wash it off before feeding your baby again. Pure lanolin is not toxic to your baby. You may also hand express a few drops of breast milk and gently massage that milk into your nipples and allow the milk to air dry. In the first few weeks after giving birth, some women experience extremely full breasts (engorgement). Engorgement can make your breasts feel heavy, warm, and tender to the touch. Engorgement peaks within 3-5 days after you give birth. The following recommendations can help ease engorgement:  Completely empty your breasts while breastfeeding or pumping. You may want to start by applying warm, moist heat (in the shower or with warm water-soaked hand towels) just before feeding or pumping.  This increases circulation and helps the milk flow. If your baby does not completely empty your breasts while breastfeeding, pump any extra milk after he or she is finished.  Wear a snug bra (nursing or regular) or tank top for 1-2 days to signal your body to slightly decrease milk production.  Apply ice packs to your breasts, unless this is too uncomfortable for you.  Make sure that your baby is latched on and positioned properly while breastfeeding. If engorgement persists after 48 hours of following these recommendations, contact your health care provider or a lactation consultant. OVERALL HEALTH CARE RECOMMENDATIONS WHILE BREASTFEEDING  Eat healthy foods. Alternate between meals and snacks, eating 3 of each per day. Because what you eat affects your breast milk, some of the foods may make your baby more irritable than usual. Avoid eating these foods if you are sure that they are negatively affecting your baby.  Drink milk, fruit juice, and water to satisfy your thirst (about 10 glasses a day).  Rest often, relax, and continue to take your prenatal vitamins to prevent fatigue, stress, and anemia.  Continue breast self-awareness checks.  Avoid chewing and smoking tobacco. Chemicals from cigarettes that pass into breast milk and exposure to secondhand smoke may harm your baby.  Avoid alcohol and drug use, including marijuana. Some medicines that may be harmful to your baby can pass through breast milk. It is important to ask your health care provider before taking any medicine, including all over-the-counter and prescription medicine as well as vitamin and herbal supplements. It is possible to become pregnant while breastfeeding. If birth control is desired, ask   your health care provider about options that will be safe for your baby. SEEK MEDICAL CARE IF:  You feel like you want to stop breastfeeding or have become frustrated with breastfeeding.  You have painful breasts or  nipples.  Your nipples are cracked or bleeding.  Your breasts are red, tender, or warm.  You have a swollen area on either breast.  You have a fever or chills.  You have nausea or vomiting.  You have drainage other than breast milk from your nipples.  Your breasts do not become full before feedings by the fifth day after you give birth.  You feel sad and depressed.  Your baby is too sleepy to eat well.  Your baby is having trouble sleeping.   Your baby is wetting less than 3 diapers in a 24-hour period.  Your baby has less than 3 stools in a 24-hour period.  Your baby's skin or the white part of his or her eyes becomes yellow.   Your baby is not gaining weight by 5 days of age. SEEK IMMEDIATE MEDICAL CARE IF:  Your baby is overly tired (lethargic) and does not want to wake up and feed.  Your baby develops an unexplained fever.   This information is not intended to replace advice given to you by your health care provider. Make sure you discuss any questions you have with your health care provider.   Document Released: 08/06/2005 Document Revised: 04/27/2015 Document Reviewed: 01/28/2013 [employer] Interactive Patient Education 2016 [employer]  

## 2015-10-24 NOTE — Progress Notes (Signed)
Declined flu at this time 

## 2015-10-24 NOTE — Progress Notes (Signed)
Subjective:  Monique Schmidt is a 30 y.o. [phone] at 6175w6d being seen today for ongoing prenatal care.  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  Patient reports no complaints.  Contractions: Not present. Vag. Bleeding: None.  Movement: Present. Denies leaking of fluid.   The following portions of the patient's history were reviewed and updated as appropriate: allergies, current medications, past family history, past medical history, past social history, past surgical history and problem list. Problem list updated.  Objective:   Filed Vitals:   10/24/15 1102  BP: 101/59  Pulse: 92  Temp: 97.8 F (36.6 C)  Weight: 325 lb 11.2 oz (147.737 kg)    Fetal Status: Fetal Heart Rate (bpm): 154   Movement: Present     General:  Alert, oriented and cooperative. Patient is in no acute distress.  Skin: Skin is warm and dry. No rash noted.   Cardiovascular: Normal heart rate noted  Respiratory: Normal respiratory effort, no problems with respiration noted  Abdomen: Soft, gravid, appropriate for gestational age. Pain/Pressure: Present     Pelvic: Vag. Bleeding: None     Cervical exam deferred        Extremities: Normal range of motion.  Edema: Trace  Mental Status: Normal mood and affect. Normal behavior. Normal judgment and thought content.   FBS 73-89 2 hr pp 101-131 Assessment and Plan:  Pregnancy: [id number] at 3975w6d  1. Supervision of high-risk pregnancy, second trimester Continue prenatal care.   2. Diabetes mellitus in pregnancy, antepartum, second trimester Continue Glyburide and  Glucophage--almost at max dosage of glyburide--may need insulin, but patient does not desire this.  General obstetric precautions and fetal movement were reviewed in detail with the patient. Please refer to After Visit Summary for other counseling recommendations.  Return in 3 weeks (on 11/14/2015).   Reva Boresanya S Jalyne Brodzinski, MD

## 2015-11-01 ENCOUNTER — Encounter (HOSPITAL_COMMUNITY): Payer: Self-pay | Admitting: *Deleted

## 2015-11-01 ENCOUNTER — Inpatient Hospital Stay (HOSPITAL_COMMUNITY)
Admission: AD | Admit: 2015-11-01 | Discharge: 2015-11-01 | Disposition: A | Discharge status: Home / Self Care | Payer: Medicaid Other | Source: Ambulatory Visit | Attending: Obstetrics & Gynecology | Admitting: Obstetrics & Gynecology

## 2015-11-01 DIAGNOSIS — O24415 Gestational diabetes mellitus in pregnancy, controlled by oral hypoglycemic drugs: Secondary | ICD-10-CM | POA: Diagnosis not present

## 2015-11-01 DIAGNOSIS — Z7982 Long term (current) use of aspirin: Secondary | ICD-10-CM | POA: Diagnosis not present

## 2015-11-01 DIAGNOSIS — O24419 Gestational diabetes mellitus in pregnancy, unspecified control: Secondary | ICD-10-CM

## 2015-11-01 DIAGNOSIS — O99891 Other specified diseases and conditions complicating pregnancy: Secondary | ICD-10-CM

## 2015-11-01 DIAGNOSIS — O9989 Other specified diseases and conditions complicating pregnancy, childbirth and the puerperium: Secondary | ICD-10-CM

## 2015-11-01 DIAGNOSIS — Z3A27 27 weeks gestation of pregnancy: Secondary | ICD-10-CM | POA: Insufficient documentation

## 2015-11-01 DIAGNOSIS — J111 Influenza due to unidentified influenza virus with other respiratory manifestations: Secondary | ICD-10-CM | POA: Diagnosis not present

## 2015-11-01 DIAGNOSIS — Z880 Allergy status to penicillin: Secondary | ICD-10-CM | POA: Diagnosis not present

## 2015-11-01 DIAGNOSIS — O99512 Diseases of the respiratory system complicating pregnancy, second trimester: Secondary | ICD-10-CM | POA: Insufficient documentation

## 2015-11-01 DIAGNOSIS — Z7984 Long term (current) use of oral hypoglycemic drugs: Secondary | ICD-10-CM | POA: Diagnosis not present

## 2015-11-01 DIAGNOSIS — R05 Cough: Secondary | ICD-10-CM | POA: Diagnosis present

## 2015-11-01 DIAGNOSIS — R69 Illness, unspecified: Secondary | ICD-10-CM

## 2015-11-01 LAB — URINALYSIS, ROUTINE W REFLEX MICROSCOPIC
BILIRUBIN URINE: NEGATIVE
Glucose, UA: NEGATIVE mg/dL
Hgb urine dipstick: NEGATIVE
Ketones, ur: NEGATIVE mg/dL
LEUKOCYTES UA: NEGATIVE
NITRITE: NEGATIVE
Protein, ur: NEGATIVE mg/dL
SPECIFIC GRAVITY, URINE: 1.01 (ref 1.005–1.030)
pH: 6.5 (ref 5.0–8.0)

## 2015-11-01 LAB — INFLUENZA PANEL BY PCR (TYPE A & B)
H1N1FLUPCR: NOT DETECTED
INFLAPCR: NEGATIVE
INFLBPCR: NEGATIVE

## 2015-11-01 MED ORDER — OSELTAMIVIR PHOSPHATE 75 MG PO CAPS: 75.0000 mg | ORAL_CAPSULE | Freq: Two times a day (BID) | ORAL | Status: DC

## 2015-11-01 MED ORDER — GLYBURIDE 5 MG PO TABS: 7.5000 mg | ORAL_TABLET | Freq: Two times a day (BID) | ORAL | Status: DC

## 2015-11-01 MED ORDER — ONDANSETRON 8 MG PO TBDP: 8.0000 mg | ORAL_TABLET | Freq: Three times a day (TID) | ORAL | Status: DC | PRN

## 2015-11-01 MED ORDER — LACTATED RINGERS IV BOLUS (SEPSIS): 1000.0000 mL | Freq: Once | INTRAVENOUS | Status: DC

## 2015-11-01 MED ORDER — ACETAMINOPHEN 325 MG PO TABS
650.0000 mg | ORAL_TABLET | Freq: Once | ORAL | Status: AC
  Administered 2015-11-01: 650 mg via ORAL
  Filled 2015-11-01: qty 2

## 2015-11-01 MED ORDER — ONDANSETRON 8 MG PO TBDP
8.0000 mg | ORAL_TABLET | Freq: Once | ORAL | Status: AC
Start: 2015-11-01 — End: 2015-11-01
  Administered 2015-11-01: 8 mg via ORAL
  Filled 2015-11-01: qty 1

## 2015-11-01 MED ORDER — OSELTAMIVIR PHOSPHATE 75 MG PO CAPS
75.0000 mg | ORAL_CAPSULE | Freq: Once | ORAL | Status: AC
  Administered 2015-11-01: 75 mg via ORAL
  Filled 2015-11-01: qty 1

## 2015-11-01 NOTE — Progress Notes (Signed)
History   CSN: [id number]  Arrival date and time: 11/01/15 1450  First Provider Initiated Contact with Patient 11/01/15 1607    Chief Complaint  Patient presents with  . Fever  . Cough  . Generalized Body Aches   HPI  Monique Schmidt is a 30 yo [id number] at 47w0dpresenting to the MAU today with flu-like symptoms. The patient states that her daughter tested positive for influenza yesterday, and the patient started to develop symptoms yesterday afternoon. The patient is endorsing generalized body aches and pains, fatigue, malaise, fever, congestion, productive cough, diarrhea, decreased appetite, nausea, and vomiting. The patient has been managing her symptoms with Tylenol (dose unknown by patient). She last took Tylenol around 9:30 this morning. The patient did not receive a flu shot this year. The patient denies vaginal discharge, bleeding, or foul odor.   OB History    Gravida Para Term Preterm AB TAB SAB Ectopic Multiple Living   _0 Past Medical History  Diagnosis Date  . Abnormal Pap smear   . Gestational diabetes     all pregnancies glyburide started 11/4  . Thrombocytopenia ([facility] [place]   . Headache(784.0)   . Vaginal Pap smear, abnormal   . Hernia, umbilical     Past Surgical History  Procedure Laterality Date  . Wisdom tooth extraction    . Umbilical hernia repair N/A 11/22/2014    Procedure: LAPAROSCOPIC UMBILICAL HERNIA;  Surgeon: ARalene Ok MD;  Location: [facility]  Service: General;  Laterality: N/A;    Family History  Problem Relation Age of Onset  . Hypertension Father   . Diabetes Father   . Diabetes Mother   . Seizures Brother     Social History  Substance Use Topics  . Smoking status: Never Smoker   . Smokeless tobacco: Never Used  . Alcohol Use: No    Allergies:  Allergies  Allergen Reactions  . Latex Itching and Rash  . Penicillins Itching and Rash    Has patient had a PCN reaction causing immediate rash, facial/tongue/throat  swelling, SOB or lightheadedness with hypotension: Yes Has patient had a PCN reaction causing severe rash involving mucus membranes or skin necrosis: Yes Has patient had a PCN reaction that required hospitalization No Has patient had a PCN reaction occurring within the last 10 years: Yes If all of the above answers are "NO", then may proceed with Cephalosporin use.     Prescriptions prior to admission  Medication Sig Dispense Refill Last Dose  . ACCU-CHEK FASTCLIX LANCETS MISC 1 Units by Percutaneous route 4 (four) times daily. 100 each 12 Taking  . aspirin 81 MG chewable tablet Chew 1 tablet (81 mg total) by mouth daily. 90 tablet 3 Taking  . Blood Glucose Monitoring Suppl (ACCU-CHEK NANO SMARTVIEW) W/DEVICE KIT 1 Device by Does not apply route as directed. Check fasting, and two hours after breakfast, lunch and dinner 1 kit 0 Taking  . butalbital-acetaminophen-caffeine (FIORICET) 50-325-40 MG tablet Take 1 tablet by mouth every 6 (six) hours as needed for headache. 20 tablet 0 Taking  . cyclobenzaprine (FLEXERIL) 10 MG tablet Take 1 tablet (10 mg total) by mouth 2 (two) times daily as needed for muscle spasms. 20 tablet 0 Taking  . glucose blood (ACCU-CHEK SMARTVIEW) test strip Check blood sugars 4x/daily 100 each 12 Taking  . glyBURIDE (DIABETA) 5 MG tablet Take 1.5 tablets (7.5 mg total) by mouth 2 (two) times daily with a meal. (  Patient taking differently: Take 7.5-10 mg by mouth 2 (two) times daily with a meal. 2 tablets in the morning and 1.5 tablets at dinner time) 90 tablet 3 Taking  . metFORMIN (GLUCOPHAGE) 500 MG tablet Take 1 tablet (500 mg total) by mouth 2 (two) times daily with a meal. 30 tablet 1 Taking  . Oxycodone HCl 10 MG TABS Take 10 mg by mouth 3 (three) times daily as needed. Reported on 10/24/2015  0 Not Taking  . Prenatal Multivit-Min-Fe-FA (PRENATAL VITAMINS) 0.8 MG tablet Take 1 tablet by mouth daily. 30 tablet 12 Taking    Review of Systems  Constitutional: Positive  for fever, chills, malaise/fatigue and diaphoresis.  HENT: Positive for congestion and sore throat. Negative for nosebleeds.   Eyes: Negative for blurred vision and double vision.  Respiratory: Positive for cough. Negative for hemoptysis, sputum production, shortness of breath and wheezing.   Cardiovascular: Negative for chest pain, palpitations and leg swelling.  Gastrointestinal: Positive for nausea, vomiting and diarrhea. Negative for heartburn, abdominal pain, constipation and blood in stool.  Genitourinary: Negative for dysuria, urgency, frequency, hematuria and flank pain.  Musculoskeletal: Positive for myalgias. Negative for joint pain and neck pain.  Skin: Negative for itching and rash.  Neurological: Positive for weakness and headaches. Negative for dizziness, tingling, focal weakness, seizures and loss of consciousness.  Endo/Heme/Allergies: Does not bruise/bleed easily.     Physical Exam   Blood pressure 98/65, pulse 115, temperature 99 F (37.2 C), temperature source Oral, resp. rate 20, SpO2 100 %, unknown if currently breastfeeding.  Physical Exam  Constitutional: She is oriented to person, place, and time. She appears well-developed and well-nourished. She is cooperative. She appears ill. No distress.  HENT:  Head: Normocephalic and atraumatic.  Right Ear: External ear normal. No tenderness.  Left Ear: External ear normal. No tenderness.  Nose: Mucosal edema and rhinorrhea present. No sinus tenderness or septal deviation. No epistaxis. Right sinus exhibits no maxillary sinus tenderness and no frontal sinus tenderness. Left sinus exhibits no maxillary sinus tenderness and no frontal sinus tenderness.  Mouth/Throat: Uvula is midline, oropharynx is clear and moist and mucous membranes are normal. No oral lesions. No uvula swelling. No posterior oropharyngeal erythema.  Eyes: Conjunctivae are normal. Pupils are equal, round, and reactive to light.  Neck: Normal range of motion  and full passive range of motion without pain. Neck supple. No rigidity. No erythema and normal range of motion present.  Cardiovascular: Normal rate, regular rhythm, S1 normal, S2 normal, normal heart sounds and intact distal pulses.  Exam reveals no gallop and no friction rub.   No murmur heard. Pulses:      Radial pulses are 2+ on the right side, and 2+ on the left side.  Respiratory: Effort normal and breath sounds normal. No respiratory distress. She has no wheezes. She has no rales. She exhibits no tenderness.  GI: Soft. Bowel sounds are normal. There is no tenderness.  Musculoskeletal: Normal range of motion. She exhibits no edema or tenderness.  Lymphadenopathy:    She has no cervical adenopathy.  Neurological: She is alert and oriented to person, place, and time. She has normal reflexes.  Skin: Skin is warm and dry. No ecchymosis, no petechiae and no rash noted. She is not diaphoretic. No erythema. No pallor.    MAU Course  Procedures  MDM Results for orders placed or performed during the hospital encounter of 11/01/15 (from the past 48 hour(s))  Urinalysis, Routine w reflex microscopic (not at [facility])  Status: None   Collection Time: 11/01/15  3:15 PM  Result Value Ref Range   Color, Urine YELLOW YELLOW   APPearance CLEAR CLEAR   Specific Gravity, Urine 1.010 1.005 - 1.030   pH 6.5 5.0 - 8.0   Glucose, UA NEGATIVE NEGATIVE mg/dL   Hgb urine dipstick NEGATIVE NEGATIVE   Bilirubin Urine NEGATIVE NEGATIVE   Ketones, ur NEGATIVE NEGATIVE mg/dL   Protein, ur NEGATIVE NEGATIVE mg/dL   Nitrite NEGATIVE NEGATIVE   Leukocytes, UA NEGATIVE NEGATIVE    Comment: MICROSCOPIC NOT DONE ON URINES WITH NEGATIVE PROTEIN, BLOOD, LEUKOCYTES, NITRITE, OR GLUCOSE <1000 mg/dL.   Patient with confirmed flu contact at home. Symptoms clinically mirror influenza presentation.  Assessment and Plan  Influenza 2nd Trimester Pregnancy  - Begin LR bolus 1,000 mL for hydration and fetal  tachycardia - Tylenol 650 mg tab for headache - Recheck in 1 hour - Discharge home with instructions for fluid intake and fever control - F/U with PCP - Call or visit in the interim with questions or concerns  Pasty Spillers PA-S 11/01/2015, 4:35 PM   OB fellow attestation:  I have seen and examined this patient; I agree with above documentation in the PA student's note.   Monique Schmidt is a 30 y.o. [phone] reporting fever, chills, nausea, vomiting, diarrhea, body aches, cough, congestion and contact with daughter who tested positive for influenza. Patient did not receive flu vaccine. +FM, denies LOF, VB, contractions, vaginal discharge.  PE: BP 98/65 mmHg  Pulse 115  Temp(Src) 99 F (37.2 C) (Oral)  Resp 20  SpO2 100%  LMP  (LMP Unknown) Gen: calm comfortable, NAD. Mildly ill-appearing Resp: normal effort, no distress. Lungs clear to auscultation bilaterally. Patient not coughing while CNM in room. Heart: Regular rate and rhythm. Abd: gravid, size equals dates. Pelvic: Deferred  ROS, labs, PMH reviewed NST reactive   MAU course/MDM UA, influenza PCR, Tamiflu. - Although patient is afebrile in MAU, she meets criteria for influenza-like illness and has positive flu contact. Will treat with Tamiflu since patient is pregnant.  Plan: - Influenza precautions. - fetal kick counts reinforced, preterm  labor precautions - Increase fluids and rest. - Take Tylenol as needed to control fever and for body aches. - Continue routine follow up in OB clinic - Rx Tamiflu   Eugune Sine, CNM 2:27 AM

## 2015-11-01 NOTE — MAU Note (Addendum)
Pt C/O body aches, HA, back pain, cough & sore throat.  Temp was "around" 102 @ 1100.  Pt daughter has the flu.  Chest feels tight.  Has occasional uc's, denies bleeding or LOF.  Vomiting since yesterday, diarrhea started this a.m.

## 2015-11-01 NOTE — Discharge Instructions (Signed)
Pregnancy and Influenza °Influenza, also called the flu, is an infection of the respiratory tract. If you are pregnant, you are more likely to catch the flu. You are also more likely to have a more serious case of the flu. This is because pregnancy lowers your body's ability to fight off infections (it weakens your immune system). It also puts additional stress on your heart and lungs, which makes you more likely to have complications. Having a bad case of the flu, especially with a high fever, can be dangerous for your developing baby. It can cause you to go into early labor. °HOW DO PEOPLE GET THE FLU? °The flu is caused by the influenza virus. This virus is common every year in the fall and winter. It spreads when virus particles get passed from person to person. You can get the virus if you are near a sick person who is coughing or sneezing. You can also get the virus if you touch something that has the virus on it and then touch your face. °HOW CAN I PROTECT MYSELF AGAINST THE FLU? °· Get a flu shot. The best way to prevent the flu is to get a flu shot before flu season starts. The flu shot is not dangerous for your developing baby. It may even help protect your baby from the flu for up to 6 months after birth. The flu shot is one type of flu vaccine. Another type is a nasal spray vaccine. Do not get the nasal spray vaccine. It is not approved for pregnancy. °· Do not come in close contact with sick people. °· Do not share food, drinks, or utensils with other people. °· Wash your hands often. Use hand sanitizer when soap and water are not available. °WHAT SHOULD I DO IF I HAVE FLU SYMPTOMS? °If you have any flu symptoms, call your health care provider right away. Flu symptoms include: °· Fever or chills. °· Muscle aches. °· Headache. °· Sore throat. °· Nasal congestion. °· Cough. °· Feeling tired. °· Loss of appetite. °· Vomiting. °· Diarrhea. °You may be able to take an antiviral medicine to keep the flu from  becoming severe and to shorten how long it lasts. °WHAT SHOULD I DO AT HOME IF I AM DIAGNOSED WITH THE FLU? °· Do not take any medicine, including cold or flu medicine, unless directed by your health care provider. °· If you take antiviral medicine, make sure you finish it even if you start to feel better. °· Drink enough fluid to keep your urine clear or pale yellow. °· Get plenty of rest. °WHEN WOULD I SEEK IMMEDIATE MEDICAL CARE IF I HAVE THE FLU? °· You have trouble breathing. °· You have chest pain. °· You begin to have labor pains. °· You have a high fever that does not go down after you take medicine. °· You do not feel your baby move. °· You have diarrhea or vomiting that will not go away. °  °This information is not intended to replace advice given to you by your health care provider. Make sure you discuss any questions you have with your health care provider. °  °Document Released: 06/08/2008 Document Revised: 08/11/2013 Document Reviewed: 07/03/2013 °Elsevier Interactive Patient Education ©2016 [employer] ° °

## 2015-11-07 NOTE — Telephone Encounter (Signed)
Left Message-  Had arranged to call patient today to review glucose readings. No answer, message left with my cell number to return call. FSB at [facility] on 08/01/15 was >200.

## 2015-11-11 ENCOUNTER — Ambulatory Visit (HOSPITAL_COMMUNITY): Admission: RE | Admit: 2015-11-11 | Payer: Medicaid Other | Source: Ambulatory Visit

## 2015-11-14 ENCOUNTER — Ambulatory Visit (INDEPENDENT_AMBULATORY_CARE_PROVIDER_SITE_OTHER): Payer: Medicaid Other | Admitting: Family Medicine

## 2015-11-14 VITALS — BP 93/64 | HR 93 | Temp 98.1°F | Wt 325.6 lb

## 2015-11-14 DIAGNOSIS — O24913 Unspecified diabetes mellitus in pregnancy, third trimester: Secondary | ICD-10-CM | POA: Diagnosis present

## 2015-11-14 DIAGNOSIS — O99113 Other diseases of the blood and blood-forming organs and certain disorders involving the immune mechanism complicating pregnancy, third trimester: Secondary | ICD-10-CM

## 2015-11-14 DIAGNOSIS — F1110 Opioid abuse, uncomplicated: Secondary | ICD-10-CM | POA: Diagnosis not present

## 2015-11-14 DIAGNOSIS — O24419 Gestational diabetes mellitus in pregnancy, unspecified control: Secondary | ICD-10-CM

## 2015-11-14 DIAGNOSIS — E669 Obesity, unspecified: Secondary | ICD-10-CM | POA: Diagnosis not present

## 2015-11-14 DIAGNOSIS — O99323 Drug use complicating pregnancy, third trimester: Secondary | ICD-10-CM

## 2015-11-14 DIAGNOSIS — D696 Thrombocytopenia, unspecified: Secondary | ICD-10-CM

## 2015-11-14 DIAGNOSIS — O99213 Obesity complicating pregnancy, third trimester: Secondary | ICD-10-CM

## 2015-11-14 LAB — GLUCOSE, CAPILLARY: Glucose-Capillary: 180 mg/dL — ABNORMAL HIGH (ref 65–99)

## 2015-11-14 MED ORDER — GLYBURIDE 5 MG PO TABS: 10.0000 mg | ORAL_TABLET | Freq: Two times a day (BID) | ORAL | Status: DC

## 2015-11-14 NOTE — Progress Notes (Signed)
Blood Sugar 180

## 2015-11-14 NOTE — Patient Instructions (Signed)
Type 1 or Type 2 Diabetes Mellitus During Pregnancy Diabetes mellitus, often simply referred to as diabetes, is a long-term (chronic) disease. Type 1 diabetes occurs when the islet cells, which are in the pancreas and make the hormone insulin, are destroyed and can no longer make insulin. Type 2 diabetes occurs when the pancreas does not make enough insulin, the cells are less responsive to the insulin that is made (insulin resistance), or both. Insulin is needed to move sugars from food into the tissue cells. The tissue cells use the sugars for energy. The lack of insulin or the lack of normal response to insulin causes excess sugars to build up in the blood instead of going into the tissue cells. As a result, high blood sugar (hyperglycemia) develops.  If blood glucose levels are kept in the normal range both before and during pregnancy, women can have a healthy pregnancy. If your blood glucose levels are not well controlled, there may be risks to you, your unborn baby, and your labor and delivery. Also, there may be risks to your baby once he or she is born.  RISK FACTORS  You are predisposed to developing type 1 diabetes if someone in your family has diabetes and you are exposed to certain environmental triggers.  You have an increased chance of developing type 2 diabetes if you have a family history of diabetes and also have one or more of the following risk factors:  Being overweight.  Having an inactive lifestyle.  Having a history of consistently eating high-calorie foods. SYMPTOMS  Increased thirst (polydipsia).  Increased urination (polyuria).  Increased urination during the night (nocturia).  Weight loss. This weight loss may be rapid.  Frequent, recurring infections.  Tiredness (fatigue).  Weakness.  Vision changes, such as blurred vision.  Fruity smell to your breath.  Abdominal pain.  Nausea or vomiting. DIAGNOSIS  If you have risk factors for diabetes, you may  be screened for undiagnosed type 2 diabetes at your first prenatal visit. If you have previously given birth and you had gestational diabetes, you should be screened. The screening should be performed 6-12 weeks after the child is born and repeated every 1-3 years after the first test. Diabetes is diagnosed when blood glucose levels are increased. Your blood glucose level may be checked by one or more of the following blood tests:  A fasting blood glucose test. You will not be allowed to eat for at least 8 hours before a blood sample is taken.  A random blood glucose test. Your blood glucose is checked at any time of the day regardless of when you ate.  A hemoglobin A1c blood glucose test. A hemoglobin A1c test provides information about blood glucose control over the previous 3 months.  An oral glucose tolerance test (OGTT). Your blood glucose is measured after you have not eaten (fasted) for 1-3 hours and then after you drink a glucose-containing beverage. An OGTT is usually performed during weeks 24-28 of your pregnancy. TREATMENT   You will need to take diabetes medicine or insulin daily to keep blood glucose levels in the desired range.  You will need to match insulin dosing with exercise and healthy food choices. If you have type 1 or type 2 diabetes, your treatment goal is to maintain the following blood glucose levels during pregnancy:  Before meals (preprandial), at bedtime, and overnight: 60-99 mg/dL.  After meals (postprandial): peak of 100-129 mg/dL.  A1c: less than 7%. HOME CARE INSTRUCTIONS   Have your hemoglobin   A1c level checked twice a year.  Perform daily blood glucose monitoring as directed by your health care provider. It is common to perform frequent blood glucose monitoring.  Monitor urine ketones when you are sick and as directed by your health care provider.  Take your diabetes medicine and insulin as directed by your health care provider to maintain your  blood glucose level in the desired range.  Never run out of diabetes medicine or insulin. It is needed every day.  Adjust insulin based on your intake of carbohydrates. Carbohydrates can raise blood glucose levels but need to be included in your diet. Carbohydrates provide vitamins, minerals, and fiber, which are an essential part of a healthy diet. Carbohydrates are found in fruits, vegetables, whole grains, dairy products, legumes, and foods containing added sugars.  Eat healthy foods. Alternate 3 meals with 3 snacks.  Maintain a healthy weight gain. The usual total expected weight gain varies according to your prepregnancy body mass index (BMI).  Carry a medical alert card or wear medical alert jewelry.  Carry a 15-gram carbohydrate snack with you at all times to treat low blood sugar (hypoglycemia). Some examples of 15-gram carbohydrate snacks include:  Glucose tablets, 3 or 4.  Glucose gel, 15-gram tube.  Raisins, 2 Tbsp (24 grams).  Jelly beans, 6.  Animal crackers, 8.  Fruit juice, regular soda, or low-fat milk, 4 ounces (120 mL).  Gummy treats, 9.  Recognize hypoglycemia. Hypoglycemia during pregnancy occurs with blood glucose levels of 60 mg/dL and below. The risk for hypoglycemia increases when fasting or skipping meals, during or after intense exercise, and during sleep. Hypoglycemia symptoms can include:  Tremors or shakes.  Decreased ability to concentrate.  Sweating.  Increased heart rate.  Headache.  Dry mouth.  Hunger.  Irritability.  Anxiety.  Restless sleep.  Altered speech or coordination.  Confusion.  Treat hypoglycemia promptly. If you are alert and able to safely swallow, follow the 15:15 rule:  Take 15-20 grams of rapid-acting glucose or carbohydrate. Rapid-acting options include glucose gel, glucose tablets, or 4 ounces (120 mL) of fruit juice, regular soda, or low-fat milk.  Check your blood glucose level 15 minutes after taking the  glucose.  Take an additional 15-20 grams of glucose if the repeat blood glucose level is still 70 mg/dL or below.  Eat a meal or snack within 1 hour once blood glucose levels return to normal.  Engage in at least 30 minutes of physical activity a day or as directed by your health care provider. Ten minutes of physical activity timed 30 minutes after each meal is encouraged to control postprandial blood glucose levels.  Watch for polyuria (excess urination) and polydipsia (feeling extra thirsty), which are early signs of hyperglycemia. An early awareness of hyperglycemia allows for prompt treatment. Treat hyperglycemia as directed by your health care provider.  Adjust your insulin dosing and food intake, as needed, if you start a new exercise or sport.  Follow your sick-day plan any time you are unable to eat or drink as usual.  Avoid tobacco and alcohol use.  Keep all follow-up visits as directed by your health care provider.  Follow the advice of your health care provider regarding your prenatal and post-delivery (postpartum) appointments, meal planning, exercise, medicines, vitamins, blood tests, other medical tests, and physical activities.  Continue daily skin and foot care. Examine your skin and feet daily for cuts, bruises, redness, nail problems, bleeding, blisters, or sores. A foot exam by a health care provider should   be done annually.  Brush your teeth and gums at least twice a day and floss at least once a day. Follow up with your dentist regularly.  Schedule an eye exam during the first trimester of your pregnancy or as directed by your health care provider.  Share your diabetes management plan with your workplace or school.  Stay up-to-date with immunizations.  Learn to manage stress.  Obtain ongoing diabetes education and support as needed.  Your health care provider may recommend that you take one low-dose aspirin (81 mg) each day to help prevent high blood pressure  during your pregnancy (preeclampsia or eclampsia). You may be at risk for preeclampsia or eclampsia if:  You had preeclampsia or eclampsia during a previous pregnancy.  Your baby did not grow as expected during a previous pregnancy.  You experienced preterm birth with a previous pregnancy.  You experienced a separation of the placenta from the uterus (placental abruption) during a previous pregnancy.  You experienced the loss of your baby during a previous pregnancy.  You are pregnant with more than one baby.  You have other medical conditions, such as high blood pressure or autoimmune disease. SEEK MEDICAL CARE IF:   You are unable to eat food or drink fluids for more than 6 hours.  You have nausea and vomiting for more than 6 hours.  You have a blood glucose level of 200 mg/dL and you have ketones in your urine.  There is a change in mental status.  You develop vision problems.  You have a persistent headache.  You have upper abdominal pain or discomfort.  You have an additional serious sickness.  You have diarrhea for more than 6 hours.  You have been sick or have had a fever for 2 days and are not getting better. SEEK IMMEDIATE MEDICAL CARE IF:  You have difficulty breathing.  You no longer feel your baby moving.  You are bleeding or have discharge from your vagina.  You start having premature contractions or labor. MAKE SURE YOU:  Understand these instructions.  Will watch your condition.  Will get help right away if you are not doing well or get worse.   This information is not intended to replace advice given to you by your health care provider. Make sure you discuss any questions you have with your health care provider.   Document Released: 04/30/2012 Document Revised: 08/27/2014 Document Reviewed: 04/30/2012 Elsevier Interactive Patient Education 2016 [employer]  

## 2015-11-14 NOTE — Progress Notes (Signed)
Subjective:  Monique Schmidt is a 30 y.o. [phone] at 961w6d being seen today for ongoing prenatal care.  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  Patient reports dizziness/lightheadedness. .  Contractions: Not present. Vag. Bleeding: None.  Movement: Present. Denies leaking of fluid.   Ear infection--> right side, drainage for 2 days. Reports sweating badly this AM. Denies fevers. Reports "flu 2 weeks ago" but of note the patient had a negative flu swab   Did not bring her log Fasting 60s-70 typically 2hr PP today 327  The following portions of the patient's history were reviewed and updated as appropriate: allergies, current medications, past family history, past medical history, past social history, past surgical history and problem list. Problem list updated.  Objective:   Filed Vitals:   11/14/15 1150  BP: 93/64  Pulse: 93  Temp: 98.1 F (36.7 C)  Weight: 325 lb 10 oz (147.703 kg)    Fetal Status: Fetal Heart Rate (bpm): 150   Movement: Present     General:  Alert, oriented and cooperative. Patient is in no acute distress.  Skin: Skin is warm and dry. No rash noted.   Cardiovascular: Normal heart rate noted  Respiratory: Normal respiratory effort, no problems with respiration noted  Abdomen: Soft, gravid, appropriate for gestational age. Pain/Pressure: Present     Pelvic: Vag. Bleeding: None     Cervical exam deferred        Extremities: Normal range of motion.     Mental Status: Normal mood and affect. Normal behavior. Normal judgment and thought content.   Urinalysis:        Assessment and Plan:  Pregnancy: [id number] at 8961w6d  1. Diabetes mellitus in pregnancy, antepartum, third trimester Suspect VERY poor control.  Does not have meter or log today. Patient with 2hr PP of 327 today and here in clinic BS 180. She is reporting increased urination and thirst Increased glyburide to 10mg  BID Continue Metformin If BS still elevated, which it is likely to be-- start insulin Stressed bringing log and meter to clinic Discussed risks of hyperglycemia in pregnancy including macrosomia and stillbirth. Discussed that this is why we need BS log to treat her appropriately Fu 1 week with Dm education   2. Obesity in pregnancy, antepartum, third trimester Super morbid obesity  3. Narcotic use in pregnancy Monitor for possible withdrawal and needs regular UDS  4. Thrombocytopenia (HCC) Monitor q trimester  5. Ear pain - no otoscope in office today - Recommended monitoring for fever and urgent care if continued sx or fever  Preterm labor symptoms and general obstetric precautions including but not limited to vaginal bleeding, contractions, leaking of fluid and fetal movement were reviewed in detail with the patient. Please refer to After Visit Summary for other counseling recommendations.  No Follow-up on file.  Future Appointments Date Time Provider Department Center  11/18/2015 3:00 PM [facility] US 1 [facility] [address]     Emmery Seiler [place], [place]

## 2015-11-18 ENCOUNTER — Encounter (HOSPITAL_COMMUNITY): Payer: Self-pay

## 2015-11-18 ENCOUNTER — Ambulatory Visit (HOSPITAL_COMMUNITY)
Admission: RE | Admit: 2015-11-18 | Discharge: 2015-11-18 | Disposition: A | Discharge status: Home / Self Care | Payer: Medicaid Other | Source: Ambulatory Visit | Attending: Obstetrics and Gynecology | Admitting: Obstetrics and Gynecology

## 2015-11-18 VITALS — BP 116/77 | HR 105 | Wt 326.2 lb

## 2015-11-18 DIAGNOSIS — Z3A29 29 weeks gestation of pregnancy: Secondary | ICD-10-CM | POA: Diagnosis not present

## 2015-11-18 DIAGNOSIS — O24113 Pre-existing diabetes mellitus, type 2, in pregnancy, third trimester: Secondary | ICD-10-CM | POA: Insufficient documentation

## 2015-11-18 DIAGNOSIS — O24919 Unspecified diabetes mellitus in pregnancy, unspecified trimester: Secondary | ICD-10-CM

## 2015-11-18 DIAGNOSIS — O09293 Supervision of pregnancy with other poor reproductive or obstetric history, third trimester: Secondary | ICD-10-CM | POA: Diagnosis present

## 2015-11-18 DIAGNOSIS — O0992 Supervision of high risk pregnancy, unspecified, second trimester: Secondary | ICD-10-CM

## 2015-11-18 DIAGNOSIS — O99213 Obesity complicating pregnancy, third trimester: Secondary | ICD-10-CM | POA: Diagnosis not present

## 2015-11-18 DIAGNOSIS — O24913 Unspecified diabetes mellitus in pregnancy, third trimester: Secondary | ICD-10-CM

## 2015-11-21 ENCOUNTER — Encounter: Payer: Medicaid Other | Attending: Family Medicine | Admitting: *Deleted

## 2015-11-21 ENCOUNTER — Ambulatory Visit: Payer: Medicaid Other | Admitting: *Deleted

## 2015-11-21 ENCOUNTER — Other Ambulatory Visit (HOSPITAL_COMMUNITY): Payer: Self-pay | Admitting: *Deleted

## 2015-11-21 DIAGNOSIS — O24019 Pre-existing diabetes mellitus, type 1, in pregnancy, unspecified trimester: Secondary | ICD-10-CM

## 2015-11-21 DIAGNOSIS — Z713 Dietary counseling and surveillance: Secondary | ICD-10-CM | POA: Diagnosis not present

## 2015-11-21 DIAGNOSIS — O24419 Gestational diabetes mellitus in pregnancy, unspecified control: Secondary | ICD-10-CM | POA: Diagnosis not present

## 2015-11-21 DIAGNOSIS — O24919 Unspecified diabetes mellitus in pregnancy, unspecified trimester: Secondary | ICD-10-CM

## 2015-11-21 MED ORDER — INSULIN NPH (HUMAN) (ISOPHANE) 100 UNIT/ML ~~LOC~~ SUSP: 20.0000 [IU] | Freq: Two times a day (BID) | SUBCUTANEOUS | Status: DC

## 2015-11-21 MED ORDER — INSULIN ASPART 100 UNIT/ML ~~LOC~~ SOLN: 33.0000 [IU] | Freq: Three times a day (TID) | SUBCUTANEOUS | Status: DC

## 2015-11-21 MED ORDER — PEN NEEDLES 31G X 6 MM MISC: 1.0000 | Freq: Every day | Status: DC

## 2015-11-21 NOTE — Progress Notes (Signed)
Follow-up U/s scheduled with MFM 04/21 @ 100pm.

## 2015-11-21 NOTE — Progress Notes (Signed)
Patient presents for review of glucose readings. All grossly elevated with start insulin. In consultation with Dr. Macon LargeAnyanwu: NPH 30 before Breakfast - 20units at bed, Novolog 33-33-33units with each meal. Instructed to discontinue gluburide. Order sent to pharmacy. Will return in one week for MD visit.

## 2015-11-28 ENCOUNTER — Ambulatory Visit (INDEPENDENT_AMBULATORY_CARE_PROVIDER_SITE_OTHER): Payer: Medicaid Other | Admitting: Obstetrics & Gynecology

## 2015-11-28 VITALS — BP 113/91 | HR 84 | Wt 330.1 lb

## 2015-11-28 DIAGNOSIS — Z789 Other specified health status: Secondary | ICD-10-CM | POA: Diagnosis not present

## 2015-11-28 DIAGNOSIS — G4700 Insomnia, unspecified: Secondary | ICD-10-CM | POA: Diagnosis not present

## 2015-11-28 DIAGNOSIS — O24913 Unspecified diabetes mellitus in pregnancy, third trimester: Secondary | ICD-10-CM

## 2015-11-28 DIAGNOSIS — O0992 Supervision of high risk pregnancy, unspecified, second trimester: Secondary | ICD-10-CM

## 2015-11-28 DIAGNOSIS — Z302 Encounter for sterilization: Secondary | ICD-10-CM | POA: Insufficient documentation

## 2015-11-28 LAB — POCT URINALYSIS DIP (DEVICE)
Bilirubin Urine: NEGATIVE
GLUCOSE, UA: NEGATIVE mg/dL
Hgb urine dipstick: NEGATIVE
KETONES UR: 40 mg/dL — AB
Leukocytes, UA: NEGATIVE
Nitrite: NEGATIVE
PROTEIN: 30 mg/dL — AB
Specific Gravity, Urine: >=1.03 (ref 1.005–1.030)
UROBILINOGEN UA: 1 mg/dL (ref 0.0–1.0)
pH: 6.5 (ref 5.0–8.0)

## 2015-11-28 MED ORDER — METFORMIN HCL 1000 MG PO TABS: 1000.0000 mg | ORAL_TABLET | Freq: Two times a day (BID) | ORAL | Status: DC

## 2015-11-28 MED ORDER — ZOLPIDEM TARTRATE 5 MG PO TABS: 5.0000 mg | ORAL_TABLET | Freq: Every evening | ORAL | Status: DC | PRN

## 2015-11-28 NOTE — Patient Instructions (Signed)
Return to clinic for any scheduled appointments or obstetric concerns, or go to [facility] for evaluation  

## 2015-11-28 NOTE — Progress Notes (Signed)
Subjective:  Monique Schmidt is a 30 y.o. [phone] at 7563w6d being seen today for ongoing prenatal care.  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  Patient reports insomnia.  Contractions: Not present. Vag. Bleeding: None.  Movement: Present. Denies leaking of fluid.   The following portions of the patient's history were reviewed and updated as appropriate: allergies, current medications, past family history, past medical history, past social history, past surgical history and problem list. Problem list updated.  Objective:   Filed Vitals:   11/28/15 1107  BP: 113/91  Pulse: 84  Weight: 330 lb 1.6 oz (149.732 kg)    Fetal Status: Fetal Heart Rate (bpm): 152   Movement: Present     General:  Alert, oriented and cooperative. Patient is in no acute distress.  Skin: Skin is warm and dry. No rash noted.   Cardiovascular: Normal heart rate noted  Respiratory: Normal respiratory effort, no problems with respiration noted  Abdomen: Soft, gravid, appropriate for gestational age. Pain/Pressure: Present     Pelvic: Vag. Bleeding: None    Cervical exam deferred        Extremities: Normal range of motion.  Edema: Trace  Mental Status: Normal mood and affect. Normal behavior. Normal judgment and thought content.   Urinalysis:        Assessment and Plan:  Pregnancy: [id number] at 6963w6d  1. Diabetes mellitus in pregnancy, antepartum, third trimester Increased insulin across the board.  NPH 35/35, Regular 35 qac. - metFORMIN (GLUCOPHAGE) 1000 MG tablet; Take 1 tablet (1,000 mg total) by mouth 2 (two)  times daily with a meal.  Dispense: 30 tablet; Refill: 4  2. Insomnia Has used in the past.   - zolpidem (AMBIEN) 5 MG tablet; Take 1 tablet (5 mg total) by mouth at bedtime as needed for sleep.  Dispense: 30 tablet; Refill: 1  3. Supervision of high-risk pregnancy, second trimester Patient desires sterilization.  Had umbilical hernia repair in 2016 using 11 cm mesh.  Would not be able to be done in hospital after vaginal delivery, will be done if cesarean delivery is done.  Interval procedure to be done with provider comfortable with LUQ entry.  Really recommended IUD, patient declined.  Partner not able to get funds for a vasectomy yet.   Preterm labor symptoms and general obstetric precautions including but not limited to vaginal bleeding, contractions, leaking of fluid and fetal movement were reviewed in detail with the patient. Please refer to After Visit Summary for other counseling recommendations.  Return in about 1 week (around 12/05/2015) for OB Visit, NST.   Tereso NewcomerUgonna A Cai Anfinson, MD

## 2015-11-28 NOTE — Progress Notes (Signed)
Pt reports insomnia. Would like something to help her sleep.

## 2015-12-05 ENCOUNTER — Encounter: Payer: Self-pay | Admitting: Obstetrics and Gynecology

## 2015-12-05 ENCOUNTER — Ambulatory Visit (INDEPENDENT_AMBULATORY_CARE_PROVIDER_SITE_OTHER): Payer: Medicaid Other | Admitting: Obstetrics and Gynecology

## 2015-12-05 VITALS — BP 97/64 | HR 97 | Wt 325.4 lb

## 2015-12-05 DIAGNOSIS — O99213 Obesity complicating pregnancy, third trimester: Secondary | ICD-10-CM

## 2015-12-05 DIAGNOSIS — O0993 Supervision of high risk pregnancy, unspecified, third trimester: Secondary | ICD-10-CM

## 2015-12-05 DIAGNOSIS — F1110 Opioid abuse, uncomplicated: Secondary | ICD-10-CM

## 2015-12-05 DIAGNOSIS — O24913 Unspecified diabetes mellitus in pregnancy, third trimester: Secondary | ICD-10-CM | POA: Diagnosis present

## 2015-12-05 DIAGNOSIS — O99323 Drug use complicating pregnancy, third trimester: Secondary | ICD-10-CM

## 2015-12-05 DIAGNOSIS — E669 Obesity, unspecified: Secondary | ICD-10-CM | POA: Diagnosis not present

## 2015-12-05 NOTE — Progress Notes (Signed)
US for growth on 4/28

## 2015-12-05 NOTE — Progress Notes (Signed)
Subjective:  Monique Schmidt is a 30 y.o. [phone] at 5746w6d being seen today for ongoing prenatal care.  She is currently monitored for the following issues for this high-risk pregnancy and has Low grade squamous intraepithelial lesion on cytologic smear of cervix (lgsil); Supervision of high-risk pregnancy; Headache in pregnancy, antepartum; Moderate persistent asthma; Obesity in pregnancy, antepartum; BMI 50.0-59.9, adult (HCC); Diabetes mellitus in pregnancy, antepartum; Thrombocytopenia (HCC); Narcotic use in pregnancy; and Request for sterilization on her problem list.  Patient reports no complaints.  Contractions: Irregular. Vag. Bleeding: None.  Movement: Present. Denies leaking of fluid.   The following portions of the patient's history were reviewed and updated as appropriate: allergies, current medications, past family history, past medical history, past social history, past surgical history and problem list. Problem list updated.  Objective:   Filed Vitals:   12/05/15 1428  BP: 97/64  Pulse: 97  Weight: 325 lb 6.4 oz (147.6 kg)    Fetal Status: Fetal Heart Rate (bpm): NST   Movement: Present     General:  Alert, oriented and cooperative. Patient is in no acute distress.  Skin: Skin is warm and dry. No rash noted.   Cardiovascular: Normal heart rate noted  Respiratory: Normal respiratory effort, no problems with respiration noted  Abdomen: Soft, gravid, appropriate for gestational age. Pain/Pressure: Present     Pelvic: Vag. Bleeding: None     Cervical exam deferred        Extremities: Normal range of motion.  Edema: Trace  Mental Status: Normal mood and affect. Normal behavior. Normal judgment and thought content.   Urinalysis:      Assessment and Plan:  Pregnancy: [id number] at 5246w6d  1. Supervision of high-risk pregnancy, third trimester Patient is doing well Will have patient sign BTL papers today in the event that delivery is by c-section. Otherwise, contraception plan  is vasectomy  2. Obesity in pregnancy, antepartum, third trimester   3. Narcotic use in pregnancy   4. Diabetes mellitus in pregnancy, antepartum, third trimester CBGs reviewed. Fasting are mainly in 90's but pp are 136-225 Patient admits to overeating at times. She also is injecting insulin right under her skin which has resulted in a lot of irritation. I suspect she is not receiving her full dose of insulin. No change in current regimen of insulin NPH 35/35 and regular 35 Follow up growth ultrasound next week NST reviewed and reactive - US MFM FETAL BPP WO NON STRESS; Future  Preterm labor symptoms and general obstetric precautions including but not limited to vaginal bleeding, contractions, leaking of fluid and fetal movement were reviewed in detail with the patient. Please refer to After Visit Summary for other counseling recommendations.  Return in about 1 day (around 12/06/2015).   Catalina AntiguaPeggy Bensen [place], MD

## 2015-12-08 ENCOUNTER — Encounter: Payer: Self-pay | Admitting: General Practice

## 2015-12-09 ENCOUNTER — Ambulatory Visit (HOSPITAL_COMMUNITY): Payer: Medicaid Other

## 2015-12-09 ENCOUNTER — Other Ambulatory Visit: Payer: Medicaid Other

## 2015-12-12 ENCOUNTER — Ambulatory Visit (INDEPENDENT_AMBULATORY_CARE_PROVIDER_SITE_OTHER): Payer: Medicaid Other | Admitting: Obstetrics and Gynecology

## 2015-12-12 VITALS — BP 114/70 | HR 98 | Wt 324.0 lb

## 2015-12-12 DIAGNOSIS — O24019 Pre-existing diabetes mellitus, type 1, in pregnancy, unspecified trimester: Secondary | ICD-10-CM

## 2015-12-12 DIAGNOSIS — O24913 Unspecified diabetes mellitus in pregnancy, third trimester: Secondary | ICD-10-CM

## 2015-12-12 DIAGNOSIS — O99113 Other diseases of the blood and blood-forming organs and certain disorders involving the immune mechanism complicating pregnancy, third trimester: Secondary | ICD-10-CM | POA: Diagnosis not present

## 2015-12-12 DIAGNOSIS — Z23 Encounter for immunization: Secondary | ICD-10-CM | POA: Diagnosis not present

## 2015-12-12 DIAGNOSIS — D696 Thrombocytopenia, unspecified: Secondary | ICD-10-CM | POA: Diagnosis not present

## 2015-12-12 DIAGNOSIS — Z302 Encounter for sterilization: Secondary | ICD-10-CM

## 2015-12-12 LAB — POCT URINALYSIS DIP (DEVICE)
Glucose, UA: NEGATIVE mg/dL
HGB URINE DIPSTICK: NEGATIVE
Ketones, ur: 40 mg/dL — AB
LEUKOCYTES UA: NEGATIVE
Nitrite: NEGATIVE
Protein, ur: 30 mg/dL — AB
Urobilinogen, UA: 2 mg/dL — ABNORMAL HIGH (ref 0.0–1.0)
pH: 6.5 (ref 5.0–8.0)

## 2015-12-12 MED ORDER — INSULIN ASPART 100 UNIT/ML ~~LOC~~ SOLN: 37.0000 [IU] | Freq: Three times a day (TID) | SUBCUTANEOUS | Status: DC

## 2015-12-12 MED ORDER — METFORMIN HCL ER 500 MG PO TB24: 2000.0000 mg | ORAL_TABLET | Freq: Every day | ORAL | Status: DC

## 2015-12-12 MED ORDER — INSULIN NPH (HUMAN) (ISOPHANE) 100 UNIT/ML ~~LOC~~ SUSP: SUBCUTANEOUS | Status: DC

## 2015-12-12 MED ORDER — TETANUS-DIPHTH-ACELL PERTUSSIS 5-2.5-18.5 LF-MCG/0.5 IM SUSP
0.5000 mL | Freq: Once | INTRAMUSCULAR | Status: AC
  Administered 2015-12-12: 0.5 mL via INTRAMUSCULAR

## 2015-12-12 NOTE — Addendum Note (Signed)
Addended by: Sherre LainASH, AMANDA A on: 12/12/2015 02:33 PM   Modules accepted: Orders

## 2015-12-12 NOTE — Progress Notes (Signed)
Subjective:  Marisa SprinklesLatasha N Carlino is a 30 y.o. [phone] at 5729w6d being seen today for ongoing prenatal care.  She is currently monitored for the following issues for this high-risk pregnancy and has Low grade squamous intraepithelial lesion on cytologic smear of cervix (lgsil); Supervision of high-risk pregnancy; Headache in pregnancy, antepartum; Moderate persistent asthma; Obesity in pregnancy, antepartum; BMI 50.0-59.9, adult (HCC); Diabetes mellitus in pregnancy, antepartum; Thrombocytopenia (HCC); Narcotic use in pregnancy; and Request for sterilization on her problem list.  Patient reports no complaints.  Contractions: Irregular. Vag. Bleeding: None.  Movement: Present. Denies leaking of fluid.   Doesn't bring log. Fastings 70s-80. 2-hour PPs 120-130s.   The following portions of the patient's history were reviewed and updated as appropriate: allergies, current medications, past family history, past medical history, past social history, past surgical history and problem list. Problem list updated.  Objective:   Filed Vitals:   12/12/15 1315  BP: 114/70  Pulse: 98  Weight: 324 lb (146.965 kg)    Fetal Status: Fetal Heart Rate (bpm): NST   Movement: Present     General:  Alert, oriented and cooperative. Patient is in no acute distress.  Skin: Skin is warm and dry. No rash noted.   Cardiovascular: Normal heart rate noted  Respiratory: Normal respiratory effort, no problems with respiration noted  Abdomen: Soft, gravid, appropriate for gestational age. Pain/Pressure: Present     Pelvic: Vag. Bleeding: None     Cervical exam deferred        Extremities: Normal range of motion.  Edema: Trace  Mental Status: Normal mood and affect. Normal behavior. Normal judgment and thought content.   Urinalysis:      Assessment and Plan:  Pregnancy: [id number] at 4129w6d  # pregnancy - cbc, rpr, hiv - tdap  # bdm - implored to bring log - given elevated pps will increase nph from 35/35 to 40/35. Also  increasing short acting from 35/35/35 to 37/37/37 - f/u one week dm educator - nst reactive today, continue 2x weekly  Preterm labor symptoms and general obstetric precautions including but not limited to vaginal bleeding, contractions, leaking of fluid and fetal movement were reviewed in detail with the patient. Please refer to After Visit Summary for other counseling recommendations.  Return in about 4 days (around 12/16/2015) for has obfu/nst appt. 12/16/15.   Kathrynn RunningNoah Bedford Reshard Guillet, MD

## 2015-12-13 ENCOUNTER — Other Ambulatory Visit: Payer: Self-pay

## 2015-12-13 DIAGNOSIS — O24019 Pre-existing diabetes mellitus, type 1, in pregnancy, unspecified trimester: Secondary | ICD-10-CM

## 2015-12-13 LAB — CBC
HCT: 36.7 % (ref 35.0–45.0)
Hemoglobin: 11.8 g/dL (ref 11.7–15.5)
MCH: 26.3 pg — ABNORMAL LOW (ref 27.0–33.0)
MCHC: 32.2 g/dL (ref 32.0–36.0)
MCV: 81.7 fL (ref 80.0–100.0)
Platelets: 96 10*3/uL — ABNORMAL LOW (ref 140–400)
RBC: 4.49 MIL/uL (ref 3.80–5.10)
RDW: 15.9 % — ABNORMAL HIGH (ref 11.0–15.0)
WBC: 7.7 10*3/uL (ref 3.8–10.8)

## 2015-12-13 LAB — RPR

## 2015-12-13 LAB — HIV ANTIBODY (ROUTINE TESTING W REFLEX): HIV: NONREACTIVE

## 2015-12-13 MED ORDER — INSULIN NPH (HUMAN) (ISOPHANE) 100 UNIT/ML ~~LOC~~ SUSP: SUBCUTANEOUS | Status: DC

## 2015-12-16 ENCOUNTER — Other Ambulatory Visit (HOSPITAL_COMMUNITY): Payer: Self-pay | Admitting: Maternal and Fetal Medicine

## 2015-12-16 ENCOUNTER — Ambulatory Visit (HOSPITAL_COMMUNITY)
Admission: RE | Admit: 2015-12-16 | Discharge: 2015-12-16 | Disposition: A | Discharge status: Home / Self Care | Payer: Medicaid Other | Source: Ambulatory Visit | Attending: Obstetrics & Gynecology | Admitting: Obstetrics & Gynecology

## 2015-12-16 ENCOUNTER — Encounter: Payer: Self-pay | Admitting: *Deleted

## 2015-12-16 ENCOUNTER — Encounter (HOSPITAL_COMMUNITY): Payer: Self-pay

## 2015-12-16 ENCOUNTER — Other Ambulatory Visit: Payer: Medicaid Other

## 2015-12-16 ENCOUNTER — Ambulatory Visit (HOSPITAL_COMMUNITY): Payer: Medicaid Other

## 2015-12-16 ENCOUNTER — Ambulatory Visit (HOSPITAL_COMMUNITY)
Admission: RE | Admit: 2015-12-16 | Discharge: 2015-12-16 | Disposition: A | Discharge status: Home / Self Care | Payer: Medicaid Other | Source: Ambulatory Visit | Attending: Maternal and Fetal Medicine | Admitting: Maternal and Fetal Medicine

## 2015-12-16 VITALS — BP 117/65 | HR 106 | Wt 323.0 lb

## 2015-12-16 DIAGNOSIS — O09293 Supervision of pregnancy with other poor reproductive or obstetric history, third trimester: Secondary | ICD-10-CM | POA: Insufficient documentation

## 2015-12-16 DIAGNOSIS — Z3A33 33 weeks gestation of pregnancy: Secondary | ICD-10-CM

## 2015-12-16 DIAGNOSIS — O24919 Unspecified diabetes mellitus in pregnancy, unspecified trimester: Secondary | ICD-10-CM

## 2015-12-16 DIAGNOSIS — O24113 Pre-existing diabetes mellitus, type 2, in pregnancy, third trimester: Secondary | ICD-10-CM | POA: Diagnosis not present

## 2015-12-16 DIAGNOSIS — O99213 Obesity complicating pregnancy, third trimester: Secondary | ICD-10-CM

## 2015-12-16 DIAGNOSIS — O24913 Unspecified diabetes mellitus in pregnancy, third trimester: Secondary | ICD-10-CM

## 2015-12-16 DIAGNOSIS — Z302 Encounter for sterilization: Secondary | ICD-10-CM

## 2015-12-16 DIAGNOSIS — O0993 Supervision of high risk pregnancy, unspecified, third trimester: Secondary | ICD-10-CM

## 2015-12-16 NOTE — Progress Notes (Signed)
Pt did not show for scheduled NST appt today @ clinic @ 1000.  Call placed to MFM dept to request that they perform NST if pt came to scheduled appt for ultrasound @ 1100. Request was approved.

## 2015-12-19 ENCOUNTER — Telehealth: Payer: Self-pay | Admitting: *Deleted

## 2015-12-19 ENCOUNTER — Other Ambulatory Visit (HOSPITAL_COMMUNITY): Payer: Self-pay | Admitting: *Deleted

## 2015-12-19 ENCOUNTER — Ambulatory Visit (INDEPENDENT_AMBULATORY_CARE_PROVIDER_SITE_OTHER): Payer: Medicaid Other | Admitting: Advanced Practice Midwife

## 2015-12-19 VITALS — BP 125/74 | HR 92 | Wt 322.0 lb

## 2015-12-19 DIAGNOSIS — O24912 Unspecified diabetes mellitus in pregnancy, second trimester: Secondary | ICD-10-CM

## 2015-12-19 DIAGNOSIS — O24913 Unspecified diabetes mellitus in pregnancy, third trimester: Secondary | ICD-10-CM

## 2015-12-19 DIAGNOSIS — O0993 Supervision of high risk pregnancy, unspecified, third trimester: Secondary | ICD-10-CM

## 2015-12-19 DIAGNOSIS — O24319 Unspecified pre-existing diabetes mellitus in pregnancy, unspecified trimester: Secondary | ICD-10-CM

## 2015-12-19 LAB — POCT URINALYSIS DIP (DEVICE)
BILIRUBIN URINE: NEGATIVE
Glucose, UA: NEGATIVE mg/dL
Hgb urine dipstick: NEGATIVE
LEUKOCYTES UA: NEGATIVE
NITRITE: NEGATIVE
Protein, ur: 30 mg/dL — AB
Specific Gravity, Urine: 1.02 (ref 1.005–1.030)
UROBILINOGEN UA: 1 mg/dL (ref 0.0–1.0)
pH: 6.5 (ref 5.0–8.0)

## 2015-12-19 NOTE — Telephone Encounter (Signed)
Patient was waiting in a patient room to be seen by a provider following her NST. She left before being seen. Attempted to call patient, went straight to voice mail. Left message stating we are sorry she had to leave, if she is having any symptoms then she needs to call us at the clinics or go to [facility]. Otherwise we will make sure she is seen on Monday at her next NST appointment.

## 2015-12-23 ENCOUNTER — Encounter (HOSPITAL_COMMUNITY)
Admission: AD | Disposition: A | Discharge status: Home / Self Care | Payer: Self-pay | Source: Ambulatory Visit | Attending: Obstetrics & Gynecology

## 2015-12-23 ENCOUNTER — Encounter (HOSPITAL_COMMUNITY): Payer: Self-pay

## 2015-12-23 ENCOUNTER — Inpatient Hospital Stay (HOSPITAL_COMMUNITY)
Admission: AD | Admit: 2015-12-23 | Discharge: 2015-12-24 | DRG: 781 | Disposition: A | Discharge status: Home / Self Care | Payer: Medicaid Other | Source: Ambulatory Visit | Attending: Obstetrics & Gynecology | Admitting: Obstetrics & Gynecology

## 2015-12-23 ENCOUNTER — Inpatient Hospital Stay (HOSPITAL_COMMUNITY): Payer: Medicaid Other | Admitting: Anesthesiology

## 2015-12-23 ENCOUNTER — Inpatient Hospital Stay (HOSPITAL_COMMUNITY): Payer: Medicaid Other

## 2015-12-23 ENCOUNTER — Ambulatory Visit (HOSPITAL_COMMUNITY)
Admission: RE | Admit: 2015-12-23 | Discharge: 2015-12-23 | Disposition: A | Discharge status: Home / Self Care | Payer: Medicaid Other | Source: Ambulatory Visit | Attending: Obstetrics and Gynecology | Admitting: Obstetrics and Gynecology

## 2015-12-23 DIAGNOSIS — O24414 Gestational diabetes mellitus in pregnancy, insulin controlled: Principal | ICD-10-CM | POA: Diagnosis present

## 2015-12-23 DIAGNOSIS — Z7982 Long term (current) use of aspirin: Secondary | ICD-10-CM

## 2015-12-23 DIAGNOSIS — O99210 Obesity complicating pregnancy, unspecified trimester: Secondary | ICD-10-CM | POA: Diagnosis present

## 2015-12-23 DIAGNOSIS — O24113 Pre-existing diabetes mellitus, type 2, in pregnancy, third trimester: Secondary | ICD-10-CM | POA: Diagnosis not present

## 2015-12-23 DIAGNOSIS — O99213 Obesity complicating pregnancy, third trimester: Secondary | ICD-10-CM

## 2015-12-23 DIAGNOSIS — Z91040 Latex allergy status: Secondary | ICD-10-CM

## 2015-12-23 DIAGNOSIS — O368990 Maternal care for other specified fetal problems, unspecified trimester, not applicable or unspecified: Secondary | ICD-10-CM | POA: Diagnosis present

## 2015-12-23 DIAGNOSIS — Z79899 Other long term (current) drug therapy: Secondary | ICD-10-CM | POA: Diagnosis not present

## 2015-12-23 DIAGNOSIS — D696 Thrombocytopenia, unspecified: Secondary | ICD-10-CM | POA: Diagnosis present

## 2015-12-23 DIAGNOSIS — O24913 Unspecified diabetes mellitus in pregnancy, third trimester: Secondary | ICD-10-CM

## 2015-12-23 DIAGNOSIS — Z302 Encounter for sterilization: Secondary | ICD-10-CM

## 2015-12-23 DIAGNOSIS — Z833 Family history of diabetes mellitus: Secondary | ICD-10-CM

## 2015-12-23 DIAGNOSIS — Z8632 Personal history of gestational diabetes: Secondary | ICD-10-CM

## 2015-12-23 DIAGNOSIS — E119 Type 2 diabetes mellitus without complications: Secondary | ICD-10-CM | POA: Diagnosis not present

## 2015-12-23 DIAGNOSIS — O0993 Supervision of high risk pregnancy, unspecified, third trimester: Secondary | ICD-10-CM

## 2015-12-23 DIAGNOSIS — O0990 Supervision of high risk pregnancy, unspecified, unspecified trimester: Secondary | ICD-10-CM

## 2015-12-23 DIAGNOSIS — Z3A34 34 weeks gestation of pregnancy: Secondary | ICD-10-CM | POA: Diagnosis not present

## 2015-12-23 DIAGNOSIS — O24919 Unspecified diabetes mellitus in pregnancy, unspecified trimester: Secondary | ICD-10-CM | POA: Diagnosis present

## 2015-12-23 DIAGNOSIS — O09293 Supervision of pregnancy with other poor reproductive or obstetric history, third trimester: Secondary | ICD-10-CM

## 2015-12-23 DIAGNOSIS — Z6841 Body Mass Index (BMI) 40.0 and over, adult: Secondary | ICD-10-CM

## 2015-12-23 DIAGNOSIS — Z880 Allergy status to penicillin: Secondary | ICD-10-CM

## 2015-12-23 DIAGNOSIS — IMO0002 Reserved for concepts with insufficient information to code with codable children: Secondary | ICD-10-CM | POA: Diagnosis present

## 2015-12-23 LAB — GLUCOSE, CAPILLARY
GLUCOSE-CAPILLARY: 105 mg/dL — AB (ref 65–99)
Glucose-Capillary: 119 mg/dL — ABNORMAL HIGH (ref 65–99)
Glucose-Capillary: 174 mg/dL — ABNORMAL HIGH (ref 65–99)

## 2015-12-23 LAB — CBC
HEMATOCRIT: 38.3 % (ref 36.0–46.0)
HEMOGLOBIN: 12.8 g/dL (ref 12.0–15.0)
MCH: 26.4 pg (ref 26.0–34.0)
MCHC: 33.4 g/dL (ref 30.0–36.0)
MCV: 79 fL (ref 78.0–100.0)
Platelets: 114 10*3/uL — ABNORMAL LOW (ref 150–400)
RBC: 4.85 MIL/uL (ref 3.87–5.11)
RDW: 15.3 % (ref 11.5–15.5)
WBC: 8.8 10*3/uL (ref 4.0–10.5)

## 2015-12-23 LAB — TYPE AND SCREEN
ABO/RH(D): A POS
Antibody Screen: NEGATIVE

## 2015-12-23 SURGERY — Surgical Case
Anesthesia: Regional

## 2015-12-23 MED ORDER — LACTATED RINGERS IV SOLN
INTRAVENOUS | Status: DC
  Administered 2015-12-23: 125 mL/h via INTRAVENOUS
  Administered 2015-12-23: 17:00:00 via INTRAVENOUS

## 2015-12-23 MED ORDER — ONDANSETRON HCL 4 MG/2ML IJ SOLN
INTRAMUSCULAR | Status: AC
Start: 2015-12-23 — End: 2015-12-23
  Filled 2015-12-23: qty 2

## 2015-12-23 MED ORDER — SODIUM CHLORIDE 0.9% FLUSH
3.0000 mL | INTRAVENOUS | Status: DC | PRN
Start: 2015-12-23 — End: 2015-12-24

## 2015-12-23 MED ORDER — SODIUM CHLORIDE 0.9 % IV SOLN: 250.0000 mL | INTRAVENOUS | Status: DC | PRN

## 2015-12-23 MED ORDER — BUTALBITAL-APAP-CAFFEINE 50-325-40 MG PO TABS: 1.0000 | ORAL_TABLET | Freq: Four times a day (QID) | ORAL | Status: DC | PRN

## 2015-12-23 MED ORDER — CITRIC ACID-SODIUM CITRATE 334-500 MG/5ML PO SOLN
ORAL | Status: DC
Start: 2015-12-23 — End: 2015-12-24
  Filled 2015-12-23: qty 15

## 2015-12-23 MED ORDER — OXYTOCIN 10 UNIT/ML IJ SOLN
INTRAMUSCULAR | Status: AC
  Filled 2015-12-23: qty 4

## 2015-12-23 MED ORDER — SCOPOLAMINE 1 MG/3DAYS TD PT72
MEDICATED_PATCH | TRANSDERMAL | Status: AC
Start: 2015-12-23 — End: 2015-12-23
  Filled 2015-12-23: qty 1

## 2015-12-23 MED ORDER — SODIUM CHLORIDE 0.9% FLUSH
3.0000 mL | Freq: Two times a day (BID) | INTRAVENOUS | Status: DC
  Administered 2015-12-23: 3 mL via INTRAVENOUS

## 2015-12-23 MED ORDER — INSULIN ASPART 100 UNIT/ML ~~LOC~~ SOLN
37.0000 [IU] | Freq: Three times a day (TID) | SUBCUTANEOUS | Status: DC
  Administered 2015-12-24: 37 [IU] via SUBCUTANEOUS

## 2015-12-23 MED ORDER — INSULIN NPH (HUMAN) (ISOPHANE) 100 UNIT/ML ~~LOC~~ SUSP
37.0000 [IU] | Freq: Two times a day (BID) | SUBCUTANEOUS | Status: DC
  Administered 2015-12-23 – 2015-12-24 (×2): 37 [IU] via SUBCUTANEOUS
  Filled 2015-12-23: qty 10

## 2015-12-23 MED ORDER — PRENATAL MULTIVITAMIN CH: 1.0000 | ORAL_TABLET | Freq: Every day | ORAL | Status: DC

## 2015-12-23 MED ORDER — GENTAMICIN SULFATE 40 MG/ML IJ SOLN
INTRAVENOUS | Status: AC
  Filled 2015-12-23: qty 11.75

## 2015-12-23 MED ORDER — PHENYLEPHRINE 8 MG IN D5W 100 ML (0.08MG/ML) PREMIX OPTIME
INJECTION | INTRAVENOUS | Status: AC
  Filled 2015-12-23: qty 100

## 2015-12-23 MED ORDER — FENTANYL CITRATE (PF) 100 MCG/2ML IJ SOLN
INTRAMUSCULAR | Status: AC
Start: 2015-12-23 — End: 2015-12-23
  Filled 2015-12-23: qty 2

## 2015-12-23 MED ORDER — MORPHINE SULFATE (PF) 0.5 MG/ML IJ SOLN
INTRAMUSCULAR | Status: AC
  Filled 2015-12-23: qty 10

## 2015-12-23 MED ORDER — INSULIN ASPART 100 UNIT/ML ~~LOC~~ SOLN
0.0000 [IU] | SUBCUTANEOUS | Status: DC
  Administered 2015-12-23: 5 [IU] via SUBCUTANEOUS

## 2015-12-23 MED ORDER — BETAMETHASONE SOD PHOS & ACET 6 (3-3) MG/ML IJ SUSP
12.0000 mg | Freq: Once | INTRAMUSCULAR | Status: AC
  Administered 2015-12-23: 12 mg via INTRAMUSCULAR
  Filled 2015-12-23: qty 2

## 2015-12-23 MED ORDER — INSULIN ASPART 100 UNIT/ML ~~LOC~~ SOLN: 0.0000 [IU] | Freq: Three times a day (TID) | SUBCUTANEOUS | Status: DC

## 2015-12-23 MED ORDER — INSULIN ASPART 100 UNIT/ML ~~LOC~~ SOLN: 0.0000 [IU] | SUBCUTANEOUS | Status: DC

## 2015-12-23 MED ORDER — INSULIN ASPART 100 UNIT/ML ~~LOC~~ SOLN
0.0000 [IU] | SUBCUTANEOUS | Status: DC
  Administered 2015-12-23: 10 [IU] via SUBCUTANEOUS
  Administered 2015-12-24: 6 [IU] via SUBCUTANEOUS

## 2015-12-23 MED ORDER — METFORMIN HCL ER 500 MG PO TB24
1000.0000 mg | ORAL_TABLET | Freq: Two times a day (BID) | ORAL | Status: DC
  Administered 2015-12-23 – 2015-12-24 (×2): 1000 mg via ORAL
  Filled 2015-12-23 (×2): qty 2

## 2015-12-23 NOTE — Anesthesia Preprocedure Evaluation (Signed)
Anesthesia Evaluation  Patient identified by MRN, date of birth, ID band Patient awake    Reviewed: Allergy & Precautions, NPO status , Patient's Chart, lab work & pertinent test results  Airway Mallampati: II  TM Distance: >3 FB Neck ROM: Full    Dental  (+) Teeth Intact, Dental Advisory Given   Pulmonary neg pulmonary ROS,    Pulmonary exam normal breath sounds clear to auscultation       Cardiovascular negative cardio ROS Normal cardiovascular exam Rhythm:Regular Rate:Normal     Neuro/Psych negative neurological ROS     GI/Hepatic negative GI ROS, Neg liver ROS, H/o umbilical hernia repair 2016    Endo/Other  diabetes, Gestational, Insulin Dependent, Oral Hypoglycemic AgentsMorbid obesity (BMI 52)  Renal/GU negative Renal ROS     Musculoskeletal negative musculoskeletal ROS (+)   Abdominal   Peds  Hematology  (+) Blood dyscrasia (Thrombocytopenia--Plt 96K on 12/12/15), ,   Anesthesia Other Findings Day of surgery medications reviewed with the patient.  Reproductive/Obstetrics                             Anesthesia Physical Anesthesia Plan Anesthesia Quick Evaluation

## 2015-12-23 NOTE — Progress Notes (Signed)
Pt back from u/s at 2030; preliminary report=bpp 10/10; RN spoke with Dr Lynetta MareAnyawu and Dr said that pt may order dinner; get blood sugar on pt prior to eating, give metformin and use sliding scale and give insulin if needed. Dr said that pt does not need to be on continuous monitoring at this time due to 10/10 bpp results.

## 2015-12-23 NOTE — H&P (Signed)
Obstetric Preoperative History and Physical  Monique Schmidt is a 30 y.o. [id number] with IUP at 61w3dpresenting from MFM with BPP 4/10, transverse fetal presentation.  Was undergoing antenatal testing today for insulin dependent DM, had NRNST -> BPP 4/10.  Dr. JJolyn Lent(MFM) recommended urgent delivery.  No other acute concerns.   [facility] risks: Patient Active Problem List   Diagnosis Date Noted  . BPP 4/10, antepartum 12/23/2015  . Request for sterilization 11/28/2015  . 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 Thrombocytopenia ([place] 08/02/2015  . Diabetes mellitus in pregnancy, antepartum 08/01/2015  . BMI 50.0-59.9, adult ([place] 11/09/2013  . Obesity in pregnancy, antepartum 09/21/2013  . Moderate persistent asthma 08/07/2013  . Headache in pregnancy, antepartum 07/23/2013  . Supervision of high-risk pregnancy 06/16/2012  . Low grade squamous intraepithelial lesion on cytologic smear of cervix (lgsil) 04/16/2011    Clinic HR[facility] Prenatal Labs  Dating 9 wk uKoreaBlood type: A/POS/-- (12/12 1032) A pos  Genetic Screen Quad: neg Antibody:NEG (12/12 1032)Neg  Anatomic UK[place]ml anatomy; serial growths for Type 2 DM (next at 24 weeks per MFM) Rubella: 11.50 (12/12 1032)immune  GTT DM RPR: NON REAC (12/12 1032) NR  Flu vaccine declined HBsAg: NEGATIVE (12/12 1032) Neg  TDaP vaccine Needs                            HIV: NONREACTIVE (12/12 1032) NR  Baby Food  breast                                             GBS: (For PCN allergy, check sensitivities)  Contraception  vasectomy possibly, desires sterilization Pap: Negative without co-testing (07/23/2013)  Circumcision Girl    Pediatrician [facility] - Dr. WSuzan Slick  Support Person Husband     Past Medical History  Diagnosis Date  . Abnormal Pap smear   . Gestational diabetes     all pregnancies glyburide started 11/4  . Thrombocytopenia ([place]   . Headache(784.0)   . Vaginal Pap  smear, abnormal   . Hernia, umbilical     Past Surgical History  Procedure Laterality Date  . Wisdom tooth extraction    . Umbilical hernia repair N/A 11/22/2014    Procedure: LAPAROSCOPIC UMBILICAL HERNIA;  Surgeon: ARalene Ok MD;  [facility]  Service: General;  Laterality: N/A;    OB History  Gravida Para Term Preterm AB SAB TAB Ectopic Multiple Living  '6 4 4  1 1    4    ' # Outcome Date GA Lbr Len/2nd Weight Sex Delivery Anes PTL Lv  6 Current           5 Term 12/15/13 342w0d6:43 / 00:02 8 lb 13.8 oz (4.02 kg) F Vag-Spont None  Y  4 Term 06/26/12 3978w1d:07 / 00:01 7 lb 2 oz (3.232 kg) F Vag-Spont None  Y  3 Term 03/08/11 39w57w1d27 / 00:02 8 lb 9 oz (3.884 kg) F Vag-Spont None  Y     Comments: none  2 Term 05/19/09 40w039w0dVag-Spont None N Y  1 SAB               Social History   Social History  . Marital Status: Single    Spouse  Name: N/A  . Number of Children: N/A  . Years of Education: N/A   Social History Main Topics  . Smoking status: Never Smoker   . Smokeless tobacco: Never Used  . Alcohol Use: No  . Drug Use: No  . Sexual Activity: Yes    Birth Control/ Protection: None     Comment: lst time 1 week ago   Other Topics Concern  . None   Social History Narrative    Family History  Problem Relation Age of Onset  . Hypertension Father   . Diabetes Father   . Diabetes Mother   . Seizures Brother     Prescriptions prior to admission  Medication Sig Dispense Refill Last Dose  . aspirin 81 MG chewable tablet Chew 1 tablet (81 mg total) by mouth daily. 90 tablet 3 12/22/2015 at Unknown time  . butalbital-acetaminophen-caffeine (FIORICET) 50-325-40 MG tablet Take 1 tablet by mouth every 6 (six) hours as needed for headache. 20 tablet 0 Past Week at Unknown time  . insulin aspart (NOVOLOG) 100 UNIT/ML injection Inject 37 Units into the skin 3 (three) times daily with meals. Please dispense FLEX Pens 10 mL 11 12/23/2015 at Unknown time  . insulin  NPH Human (HUMULIN N,NOVOLIN N) 100 UNIT/ML injection Inject 40 units in the morning and 35 units at night Please dispense FLEX PENS 10 mL 3 12/23/2015 at Unknown time  . metFORMIN (GLUCOPHAGE XR) 500 MG 24 hr tablet Take 4 tablets (2,000 mg total) by mouth daily with breakfast. (Patient taking differently: Take 1,000 mg by mouth 2 (two) times daily. ) 120 tablet 3 12/22/2015 at Unknown time  . ondansetron (ZOFRAN-ODT) 8 MG disintegrating tablet Take 8 mg by mouth every 8 (eight) hours as needed for nausea.   2 Past Week at Unknown time  . Prenatal Multivit-Min-Fe-FA (PRENATAL VITAMINS) 0.8 MG tablet Take 1 tablet by mouth daily. 30 tablet 12 12/22/2015 at Unknown time  . zolpidem (AMBIEN) 5 MG tablet Take 1 tablet (5 mg total) by mouth at bedtime as needed for sleep. 30 tablet 1 Past Week at Unknown time    Allergies  Allergen Reactions  . Latex Itching and Rash  . Penicillins Itching and Rash    Has patient had a PCN reaction causing immediate rash, facial/tongue/throat swelling, SOB or lightheadedness with hypotension: Yes Has patient had a PCN reaction causing severe rash involving mucus membranes or skin necrosis: Yes Has patient had a PCN reaction that required hospitalization No Has patient had a PCN reaction occurring within the last 10 years: Yes If all of the above answers are "NO", then may proceed with Cephalosporin use.     Review of Systems: Negative except for what is mentioned in HPI.  Physical Exam: BP 122/78 mmHg  Pulse 98  Temp(Src) 97.3 F (36.3 C) (Oral)  Resp 20  Ht '5\' 6"'  (1.676 m)  Wt 324 lb (146.965 kg)  BMI 52.32 kg/m2  LMP  (LMP Unknown) FHT: 135 bpm, moderate variability, +accelerations, no decelerations CONSTITUTIONAL: Well-developed, well-nourished female in no acute distress.  HENT:  Normocephalic, atraumatic, External right and left ear normal. Oropharynx is clear and moist EYES: Conjunctivae and EOM are normal. Pupils are equal, round, and reactive to  light. No scleral icterus.  NECK: Normal range of motion, supple, no masses SKIN: Skin is warm and dry. No rash noted. Not diaphoretic. No erythema. No pallor. [place]: Alert and oriented to person, place, and time. Normal reflexes, muscle tone coordination. No cranial nerve deficit noted.  PSYCHIATRIC: Normal mood and affect. Normal behavior. Normal judgment and thought content. CARDIOVASCULAR: Normal heart rate noted, regular rhythm RESPIRATORY: Effort and breath sounds normal, no problems with respiration noted ABDOMEN: Soft, obese, nontender, nondistended, gravid.  PELVIC: Deferred MUSCULOSKELETAL: Normal range of motion. No edema and no tenderness. 2+ distal pulses.   Pertinent Labs/Studies:   Results for orders placed or performed during the hospital encounter of 12/23/15 (from the past 72 hour(s))  CBC     Status: Abnormal   Collection Time: 12/23/15  4:30 PM  Result Value Ref Range   WBC 8.8 4.0 - 10.5 K/uL   RBC 4.85 3.87 - 5.11 MIL/uL   Hemoglobin 12.8 12.0 - 15.0 g/dL   HCT 38.3 36.0 - 46.0 %   MCV 79.0 78.0 - 100.0 fL   MCH 26.4 26.0 - 34.0 pg   MCHC 33.4 30.0 - 36.0 g/dL   RDW 15.3 11.5 - 15.5 %   Platelets 114 (L) 150 - 400 K/uL    Comment: PLATELET COUNT CONFIRMED BY SMEAR LARGE PLATELETS PRESENT REPEATED TO VERIFY SPECIMEN CHECKED FOR CLOTS   Type and screen [place]     Status: None   Collection Time: 12/23/15  4:30 PM  Result Value Ref Range   ABO/RH(D) A POS    Antibody Screen NEG    Sample Expiration 12/26/2015   Glucose, capillary     Status: Abnormal   Collection Time: 12/23/15  4:35 PM  Result Value Ref Range   Glucose-Capillary 119 (H) 65 - 99 mg/dL    Assessment and Plan :Monique Schmidt is a 30 y.o. [id number] at 23w3dbeing admitted for BPP 4/10, now 6/10.  Reconsulted Dr. NNelda Severe he recommended observation for now and repeat BPP around 2000.  If BPP 6/10 or less, will need to progress with cesarean delivery.  The risks of  cesarean section were discussed with the patient including but were not limited to: bleeding which may require transfusion or reoperation; infection which may require antibiotics; injury to bowel, bladder, ureters or other surrounding organs; injury to the fetus; need for additional procedures including hysterectomy in the event of a life-threatening hemorrhage; placental abnormalities wth subsequent pregnancies, incisional problems, thromboembolic phenomenon and other postoperative/anesthesia complications.  Patient also desires permanent sterilization.  Other reversible forms of contraception were discussed with patient; she declines all other modalities. Risks of procedure discussed with patient including but not limited to: risk of regret, permanence of method, bleeding, infection, injury to surrounding organs and need for additional procedures.  Failure risk of 1-2% with increased risk of ectopic gestation if pregnancy occurs was also discussed with patient.  The patient concurred with the proposed plan, giving informed written consent for the procedures.  Patient has been NPO since this morning she will remain NPO for now. Anesthesia and OR aware.  Preoperative prophylactic antibiotics and SCDs ordered on call to the OR.    Will have continuous FHR monitoring on L&D for now. Betamethasone 12 mg ordered, will monitor CBGs and manage accordingly.   UVerita Schneiders MD, [place] Attending [place] [facility]

## 2015-12-23 NOTE — Progress Notes (Signed)
Faculty Practice OB/GYN Attending Note  Subjective:  Patient just returned for repeat BPP and has no complaints; denies contractions, LOF or vaginal bleeding. Good FM.    Admitted on 12/23/2015 for BPP 4/10 earlier today -> 6/10 on L&D.   Objective:  Blood pressure 101/56, pulse 101, temperature 98.4 F (36.9 C), temperature source Oral, resp. rate 18, height 5\' 6"  (1.676 m), weight 324 lb (146.965 kg), unknown if currently breastfeeding. FHT  Baseline 125 bpm, moderate variability, +accelerations, no decelerations Toco: no contractions Gen: NAD HENT: Normocephalic, atraumatic Lungs: Normal respiratory effort Heart: Regular rate noted Abdomen: NT, gravid fundus, soft Cervix: Deferred Ext: 2+ DTRs, no edema, no cyanosis, negative Homan's sign  Repeat BPP     Assessment & Plan:  30 y.o. [id number] at 881w3d admitted for BPP 4/10, now 10/10. Cephalic presentation. Reconsulted Dr. Otho PerlNitsche (MFM) who agreed with the plan to observe overnight; repeat BPP in the morning.  If still reassuring then, will discharge to home. Will also monitor CBGs closely given administration of betamethasone earlier today, second dose will not be given if BPP is reassuring tomorrow.   Continue close overnight observation; can be transferred to Antenatal Unit. Plan discussed with patient and her family members.   Jaynie CollinsUGONNA  ANYANWU, MD, FACOG Attending Obstetrician & Gynecologist Faculty Practice, [facility]

## 2015-12-23 NOTE — ED Notes (Signed)
Pt to room 172. [facility].

## 2015-12-24 ENCOUNTER — Inpatient Hospital Stay (HOSPITAL_COMMUNITY): Payer: Medicaid Other

## 2015-12-24 DIAGNOSIS — IMO0002 Reserved for concepts with insufficient information to code with codable children: Secondary | ICD-10-CM | POA: Diagnosis present

## 2015-12-24 DIAGNOSIS — Z3A34 34 weeks gestation of pregnancy: Secondary | ICD-10-CM

## 2015-12-24 DIAGNOSIS — E119 Type 2 diabetes mellitus without complications: Secondary | ICD-10-CM

## 2015-12-24 DIAGNOSIS — O24113 Pre-existing diabetes mellitus, type 2, in pregnancy, third trimester: Secondary | ICD-10-CM

## 2015-12-24 LAB — RPR: RPR Ser Ql: NONREACTIVE

## 2015-12-24 LAB — GLUCOSE, CAPILLARY
Glucose-Capillary: 124 mg/dL — ABNORMAL HIGH (ref 65–99)
Glucose-Capillary: 213 mg/dL — ABNORMAL HIGH (ref 65–99)

## 2015-12-24 MED ORDER — ACETAMINOPHEN 325 MG PO TABS
650.0000 mg | ORAL_TABLET | Freq: Once | ORAL | Status: AC
  Administered 2015-12-24: 650 mg via ORAL
  Filled 2015-12-24: qty 2

## 2015-12-24 MED ORDER — ONDANSETRON HCL 4 MG/2ML IJ SOLN
4.0000 mg | Freq: Once | INTRAMUSCULAR | Status: AC
  Administered 2015-12-24: 4 mg via INTRAVENOUS
  Filled 2015-12-24: qty 2

## 2015-12-24 NOTE — Progress Notes (Signed)
Pt discharged per MD order and protocol. Discharge instructions reviewed with patient and all questions answered. Pt aware of follow up appointments.  

## 2015-12-24 NOTE — Discharge Instructions (Signed)
Fetal Movement Counts  Patient Name: __________________________________________________ Patient Due Date: ____________________  Performing a fetal movement count is highly recommended in high-risk pregnancies, but it is good for every pregnant woman to do. Your health care provider may ask you to start counting fetal movements at 28 weeks of the pregnancy. Fetal movements often increase:  · After eating a full meal.  · After physical activity.  · After eating or drinking something sweet or cold.  · At rest.  Pay attention to when you feel the baby is most active. This will help you notice a pattern of your baby's sleep and wake cycles and what factors contribute to an increase in fetal movement. It is important to perform a fetal movement count at the same time each day when your baby is normally most active.   HOW TO COUNT FETAL MOVEMENTS  1. Find a quiet and comfortable area to sit or lie down on your left side. Lying on your left side provides the best blood and oxygen circulation to your baby.  2. Write down the day and time on a sheet of paper or in a journal.  3. Start counting kicks, flutters, swishes, rolls, or jabs in a 2-hour period. You should feel at least 10 movements within 2 hours.  4. If you do not feel 10 movements in 2 hours, wait 2-3 hours and count again. Look for a change in the pattern or not enough counts in 2 hours.  SEEK MEDICAL CARE IF:  · You feel less than 10 counts in 2 hours, tried twice.  · There is no movement in over an hour.  · The pattern is changing or taking longer each day to reach 10 counts in 2 hours.  · You feel the baby is not moving as he or she usually does.  Date: ____________ Movements: ____________ Start time: ____________ Finish time: ____________   Date: ____________ Movements: ____________ Start time: ____________ Finish time: ____________  Date: ____________ Movements: ____________ Start time: ____________ Finish time: ____________  Date: ____________ Movements:  ____________ Start time: ____________ Finish time: ____________  Date: ____________ Movements: ____________ Start time: ____________ Finish time: ____________  Date: ____________ Movements: ____________ Start time: ____________ Finish time: ____________  Date: ____________ Movements: ____________ Start time: ____________ Finish time: ____________  Date: ____________ Movements: ____________ Start time: ____________ Finish time: ____________   Date: ____________ Movements: ____________ Start time: ____________ Finish time: ____________  Date: ____________ Movements: ____________ Start time: ____________ Finish time: ____________  Date: ____________ Movements: ____________ Start time: ____________ Finish time: ____________  Date: ____________ Movements: ____________ Start time: ____________ Finish time: ____________  Date: ____________ Movements: ____________ Start time: ____________ Finish time: ____________  Date: ____________ Movements: ____________ Start time: ____________ Finish time: ____________  Date: ____________ Movements: ____________ Start time: ____________ Finish time: ____________   Date: ____________ Movements: ____________ Start time: ____________ Finish time: ____________  Date: ____________ Movements: ____________ Start time: ____________ Finish time: ____________  Date: ____________ Movements: ____________ Start time: ____________ Finish time: ____________  Date: ____________ Movements: ____________ Start time: ____________ Finish time: ____________  Date: ____________ Movements: ____________ Start time: ____________ Finish time: ____________  Date: ____________ Movements: ____________ Start time: ____________ Finish time: ____________  Date: ____________ Movements: ____________ Start time: ____________ Finish time: ____________   Date: ____________ Movements: ____________ Start time: ____________ Finish time: ____________  Date: ____________ Movements: ____________ Start time: ____________ Finish  time: ____________  Date: ____________ Movements: ____________ Start time: ____________ Finish time: ____________  Date: ____________ Movements: ____________ Start time:   ____________ Finish time: ____________  Date: ____________ Movements: ____________ Start time: ____________ Finish time: ____________  Date: ____________ Movements: ____________ Start time: ____________ Finish time: ____________  Date: ____________ Movements: ____________ Start time: ____________ Finish time: ____________   Date: ____________ Movements: ____________ Start time: ____________ Finish time: ____________  Date: ____________ Movements: ____________ Start time: ____________ Finish time: ____________  Date: ____________ Movements: ____________ Start time: ____________ Finish time: ____________  Date: ____________ Movements: ____________ Start time: ____________ Finish time: ____________  Date: ____________ Movements: ____________ Start time: ____________ Finish time: ____________  Date: ____________ Movements: ____________ Start time: ____________ Finish time: ____________  Date: ____________ Movements: ____________ Start time: ____________ Finish time: ____________   Date: ____________ Movements: ____________ Start time: ____________ Finish time: ____________  Date: ____________ Movements: ____________ Start time: ____________ Finish time: ____________  Date: ____________ Movements: ____________ Start time: ____________ Finish time: ____________  Date: ____________ Movements: ____________ Start time: ____________ Finish time: ____________  Date: ____________ Movements: ____________ Start time: ____________ Finish time: ____________  Date: ____________ Movements: ____________ Start time: ____________ Finish time: ____________  Date: ____________ Movements: ____________ Start time: ____________ Finish time: ____________   Date: ____________ Movements: ____________ Start time: ____________ Finish time: ____________  Date: ____________  Movements: ____________ Start time: ____________ Finish time: ____________  Date: ____________ Movements: ____________ Start time: ____________ Finish time: ____________  Date: ____________ Movements: ____________ Start time: ____________ Finish time: ____________  Date: ____________ Movements: ____________ Start time: ____________ Finish time: ____________  Date: ____________ Movements: ____________ Start time: ____________ Finish time: ____________  Date: ____________ Movements: ____________ Start time: ____________ Finish time: ____________   Date: ____________ Movements: ____________ Start time: ____________ Finish time: ____________  Date: ____________ Movements: ____________ Start time: ____________ Finish time: ____________  Date: ____________ Movements: ____________ Start time: ____________ Finish time: ____________  Date: ____________ Movements: ____________ Start time: ____________ Finish time: ____________  Date: ____________ Movements: ____________ Start time: ____________ Finish time: ____________  Date: ____________ Movements: ____________ Start time: ____________ Finish time: ____________     This information is not intended to replace advice given to you by your health care provider. Make sure you discuss any questions you have with your health care provider.     Document Released: 09/05/2006 Document Revised: 08/27/2014 Document Reviewed: 06/02/2012  [employer]vier Interactive Patient Education ©2016 [employer]

## 2015-12-24 NOTE — Progress Notes (Signed)
Pt to Ultrasound via w/c.

## 2015-12-24 NOTE — Discharge Summary (Signed)
Antenatal Physician Discharge Summary  Patient ID: Monique Schmidt MRN: [id number] DOB/AGE: 31/02/1986 31 y.o.  Admit date: 12/23/2015 Discharge date: 12/24/2015  Admission Diagnoses: BPP of 4/10, transverse presentation, [redacted] weeks GA, insulin dependent DM  Discharge Diagnoses: BPP 10/10 x 2, cephalic presentation, [redacted] weeks GA, insulin dependent DM  Prenatal Procedures: NST and ultrasound  Consults: Maternal Fetal Medicine  Hospital Course: Monique Schmidt is a 30 y.o. [phone] with IUP at 1932w3d presenting from MFM with BPP 4/10, transverse fetal presentation. Was undergoing antenatal testing for insulin dependent DM, had NRNST -> BPP 4/10. Dr. Rema FendtJoshua Nitsche (MFM) recommended urgent delivery.While being prepared for surgery, she has a reactive NST, BPP was then 6/10.  Reconsulted Dr. Otho PerlNitsche, who recommended continuous FHR monitoring and repeat BPP in 4 hours.  Repeat BPP was 10/10; fetus noted to be in cephalic presentation.  She was further observed overnight, had reactive FHR tracing.  Repeat BPP next morning was 10/10.  She was deemed stable for discharge to home with outpatient follow up; Dr. Otho PerlNitsche agree with this plan.  Of note, patient did receive one dose of betamethasone at admission, her CBGs were closely monitored and insulin regimen was adjusted accordingly. Will continue to monitor closely.  Second dose of betamethasone was not given.   Discharge Exam: Pulse Rate:  [91] 91 (05/08 1205) BP: (115)/(82) 115/82 mmHg (05/08 1205) Weight:  [322 lb 8 oz (146.285 kg)] 322 lb 8 oz (146.285 kg) (05/08 1205) Physical Examination: CONSTITUTIONAL: Well-developed, well-nourished female in no acute distress.  HENT:  Normocephalic, atraumatic, External right and left ear normal. Oropharynx is clear and moist EYES: Conjunctivae and EOM are normal. Pupils are equal, round, and reactive to light. No scleral icterus.  NECK: Normal range of motion, supple, no masses SKIN: Skin is warm and dry.  No rash noted. Not diaphoretic. No erythema. No pallor. NEUROLGIC: Alert and oriented to person, place, and time. Normal reflexes, muscle tone coordination. No cranial nerve deficit noted. PSYCHIATRIC: Normal mood and affect. Normal behavior. Normal judgment and thought content. CARDIOVASCULAR: Normal heart rate noted, regular rhythm RESPIRATORY: Effort and breath sounds normal, no problems with respiration noted MUSCULOSKELETAL: Normal range of motion. No edema and no tenderness. 2+ distal pulses. ABDOMEN: Soft,obese, nontender, nondistended, gravid. CERVIX:  Deferred  Fetal monitoring: FHR: 130 bpm, Variability: moderate, Accelerations: Present, Decelerations: Absent  Uterine activity: No contractions noted  Significant Diagnostic Studies:  Results for orders placed or performed during the hospital encounter of 12/23/15 (from the past 168 hour(s))  CBC   Collection Time: 12/23/15  4:30 PM  Result Value Ref Range   WBC 8.8 4.0 - 10.5 K/uL   RBC 4.85 3.87 - 5.11 MIL/uL   Hemoglobin 12.8 12.0 - 15.0 g/dL   HCT 14.738.3 82.936.0 - 56.246.0 %   MCV 79.0 78.0 - 100.0 fL   MCH 26.4 26.0 - 34.0 pg   MCHC 33.4 30.0 - 36.0 g/dL   RDW 13.015.3 86.511.5 - 78.415.5 %   Platelets 114 (L) 150 - 400 K/uL  RPR   Collection Time: 12/23/15  4:30 PM  Result Value Ref Range   RPR Ser Ql Non Reactive Non Reactive  Type and screen [facility]   Collection Time: 12/23/15  4:30 PM  Result Value Ref Range   ABO/RH(D) A POS    Antibody Screen NEG    Sample Expiration 12/26/2015   Glucose, capillary   Collection Time: 12/23/15  4:35 PM  Result Value Ref Range   Glucose-Capillary  119 (H) 65 - 99 mg/dL  Glucose, capillary   Collection Time: 12/23/15  9:24 PM  Result Value Ref Range   Glucose-Capillary 105 (H) 65 - 99 mg/dL  Glucose, capillary   Collection Time: 12/23/15 11:32 PM  Result Value Ref Range   Glucose-Capillary 174 (H) 65 - 99 mg/dL  Glucose, capillary   Collection Time: 12/24/15  5:27 AM   Result Value Ref Range   Glucose-Capillary 124 (H) 65 - 99 mg/dL  Glucose, capillary   Collection Time: 12/24/15  8:48 AM  Result Value Ref Range   Glucose-Capillary 213 (H) 65 - 99 mg/dL  Results for orders placed or performed in visit on 12/19/15 (from the past 168 hour(s))  POCT urinalysis dip (device)   Collection Time: 12/19/15  4:08 PM  Result Value Ref Range   Glucose, UA NEGATIVE NEGATIVE mg/dL   Bilirubin Urine NEGATIVE NEGATIVE   Ketones, ur TRACE (A) NEGATIVE mg/dL   Specific Gravity, Urine 1.020 1.005 - 1.030   Hgb urine dipstick NEGATIVE NEGATIVE   pH 6.5 5.0 - 8.0   Protein, ur 30 (A) NEGATIVE mg/dL   Urobilinogen, UA 1.0 0.0 - 1.0 mg/dL   Nitrite NEGATIVE NEGATIVE   Leukocytes, UA NEGATIVE NEGATIVE    Discharge Condition: Stable  Disposition: 01-Home or Self Care      Medication List    TAKE these medications        aspirin 81 MG chewable tablet  Chew 1 tablet (81 mg total) by mouth daily.     butalbital-acetaminophen-caffeine 50-325-40 MG tablet  Commonly known as:  FIORICET  Take 1 tablet by mouth every 6 (six) hours as needed for headache.     insulin aspart 100 UNIT/ML injection  Commonly known as:  NOVOLOG  Inject 37 Units into the skin 3 (three) times daily with meals. Please dispense FLEX Pens     insulin NPH Human 100 UNIT/ML injection  Commonly known as:  HUMULIN N,NOVOLIN N  Inject 40 units in the morning and 35 units at night Please dispense FLEX PENS     metFORMIN 500 MG 24 hr tablet  Commonly known as:  GLUCOPHAGE XR  Take 4 tablets (2,000 mg total) by mouth daily with breakfast.     ondansetron 8 MG disintegrating tablet  Commonly known as:  ZOFRAN-ODT  Take 8 mg by mouth every 8 (eight) hours as needed for nausea.     Prenatal Vitamins 0.8 MG tablet  Take 1 tablet by mouth daily.     zolpidem 5 MG tablet  Commonly known as:  AMBIEN  Take 1 tablet (5 mg total) by mouth at bedtime as needed for sleep.           Follow-up  Information    Schedule an appointment as soon as possible for a visit with [facility].   Contact information:   [address] [place] [address] [phone]      Signed: Eugene Garnet.D.

## 2015-12-26 ENCOUNTER — Ambulatory Visit (INDEPENDENT_AMBULATORY_CARE_PROVIDER_SITE_OTHER): Payer: Medicaid Other | Admitting: Obstetrics & Gynecology

## 2015-12-26 VITALS — BP 115/82 | HR 91 | Wt 322.5 lb

## 2015-12-26 DIAGNOSIS — O99323 Drug use complicating pregnancy, third trimester: Secondary | ICD-10-CM | POA: Diagnosis not present

## 2015-12-26 DIAGNOSIS — O99213 Obesity complicating pregnancy, third trimester: Secondary | ICD-10-CM | POA: Diagnosis not present

## 2015-12-26 DIAGNOSIS — E669 Obesity, unspecified: Secondary | ICD-10-CM | POA: Diagnosis not present

## 2015-12-26 DIAGNOSIS — O99113 Other diseases of the blood and blood-forming organs and certain disorders involving the immune mechanism complicating pregnancy, third trimester: Secondary | ICD-10-CM

## 2015-12-26 DIAGNOSIS — O24913 Unspecified diabetes mellitus in pregnancy, third trimester: Secondary | ICD-10-CM

## 2015-12-26 DIAGNOSIS — D696 Thrombocytopenia, unspecified: Secondary | ICD-10-CM

## 2015-12-26 DIAGNOSIS — F1190 Opioid use, unspecified, uncomplicated: Secondary | ICD-10-CM | POA: Diagnosis not present

## 2015-12-26 DIAGNOSIS — O0993 Supervision of high risk pregnancy, unspecified, third trimester: Secondary | ICD-10-CM

## 2015-12-26 LAB — POCT URINALYSIS DIP (DEVICE)
BILIRUBIN URINE: NEGATIVE
Glucose, UA: NEGATIVE mg/dL
HGB URINE DIPSTICK: NEGATIVE
Ketones, ur: NEGATIVE mg/dL
Leukocytes, UA: NEGATIVE
Nitrite: NEGATIVE
PH: 6 (ref 5.0–8.0)
Protein, ur: NEGATIVE mg/dL
SPECIFIC GRAVITY, URINE: 1.01 (ref 1.005–1.030)
Urobilinogen, UA: 0.2 mg/dL (ref 0.0–1.0)

## 2015-12-26 NOTE — Progress Notes (Signed)
Pt reports decreased FM x 2 days - since D/C from hospital.  Pt felt good FM during NST today.

## 2015-12-26 NOTE — Patient Instructions (Signed)
Gestational Diabetes Mellitus  Gestational diabetes mellitus, often simply referred to as gestational diabetes, is a type of diabetes that some women develop during pregnancy. In gestational diabetes, the pancreas does not make enough insulin (a hormone), the cells are less responsive to the insulin that is made (insulin resistance), or both. Normally, insulin moves sugars from food into the tissue cells. The tissue cells use the sugars for energy. The lack of insulin or the lack of normal response to insulin causes excess sugars to build up in the blood instead of going into the tissue cells. As a result, high blood sugar (hyperglycemia) develops. The effect of high sugar (glucose) levels can cause many problems.   RISK FACTORS  You have an increased chance of developing gestational diabetes if you have a family history of diabetes and also have one or more of the following risk factors:  · A body mass index over 30 (obesity).  · A previous pregnancy with gestational diabetes.  · An older age at the time of pregnancy.  If blood glucose levels are kept in the normal range during pregnancy, women can have a healthy pregnancy. If your blood glucose levels are not well controlled, there may be risks to you, your unborn baby (fetus), your labor and delivery, or your newborn baby.   SYMPTOMS   If symptoms are experienced, they are much like symptoms you would normally expect during pregnancy. The symptoms of gestational diabetes include:   · Increased thirst (polydipsia).  · Increased urination (polyuria).  · Increased urination during the night (nocturia).  · Weight loss. This weight loss may be rapid.  · Frequent, recurring infections.  · Tiredness (fatigue).  · Weakness.  · Vision changes, such as blurred vision.  · Fruity smell to your breath.  · Abdominal pain.  DIAGNOSIS  Diabetes is diagnosed when blood glucose levels are increased. Your blood glucose level may be checked by one or more of the following blood  tests:  · A fasting blood glucose test. You will not be allowed to eat for at least 8 hours before a blood sample is taken.  · A random blood glucose test. Your blood glucose is checked at any time of the day regardless of when you ate.  · An oral glucose tolerance test (OGTT). Your blood glucose is measured after you have not eaten (fasted) for 1-3 hours and then after you drink a glucose-containing beverage. Since the hormones that cause insulin resistance are highest at about 24-28 weeks of a pregnancy, an OGTT is usually performed during that time. If you have risk factors, you may be screened for undiagnosed type 2 diabetes at your first prenatal visit.  TREATMENT   Gestational diabetes should be managed first with diet and exercise. Medicines may be added only if they are needed.  · You will need to take diabetes medicine or insulin daily to keep blood glucose levels in the desired range.  · You will need to match insulin dosing with exercise and healthy food choices.  If you have gestational diabetes, your treatment goal is to maintain the following blood glucose levels:  · Before meals (preprandial): at or below 95 mg/dL.  · After meals (postprandial):    One hour after a meal: at or below 140 mg/dL.    Two hours after a meal: at or below 120 mg/dL.  If you have pre-existing type 1 or type 2 diabetes, your treatment goal is to maintain the following blood glucose levels:  · Before   meals, at bedtime, and overnight: 60-99 mg/dL.  · After meals: peak of 100-129 mg/dL.  HOME CARE INSTRUCTIONS   · Have your hemoglobin A1c level checked twice a year.  · Perform daily blood glucose monitoring as directed by your health care provider. It is common to perform frequent blood glucose monitoring.  · Monitor urine ketones when you are ill and as directed by your health care provider.  · Take your diabetes medicine and insulin as directed by your health care provider to maintain your blood glucose level in the desired  range.  ¨ Never run out of diabetes medicine or insulin. It is needed every day.  ¨ Adjust insulin based on your intake of carbohydrates. Carbohydrates can raise blood glucose levels but need to be included in your diet. Carbohydrates provide vitamins, minerals, and fiber which are an essential part of a healthy diet. Carbohydrates are found in fruits, vegetables, whole grains, dairy products, legumes, and foods containing added sugars.  · Eat healthy foods. Alternate 3 meals with 3 snacks.  · Maintain a healthy weight gain. The usual total expected weight gain varies according to your prepregnancy body mass index (BMI).  · Carry a medical alert card or wear your medical alert jewelry.  · Carry a 15-gram carbohydrate snack with you at all times to treat low blood glucose (hypoglycemia). Some examples of 15-gram carbohydrate snacks include:  ¨ Glucose tablets, 3 or 4.  ¨ Glucose gel, 15-gram tube.  ¨ Raisins, 2 tablespoons (24 g).  ¨ Jelly beans, 6.  ¨ Animal crackers, 8.  ¨ Fruit juice, regular soda, or low-fat milk, 4 ounces (120 mL).  ¨ Gummy treats, 9.  · Recognize hypoglycemia. Hypoglycemia during pregnancy occurs with blood glucose levels of 60 mg/dL and below. The risk for hypoglycemia increases when fasting or skipping meals, during or after intense exercise, and during sleep. Hypoglycemia symptoms can include:  ¨ Tremors or shakes.  ¨ Decreased ability to concentrate.  ¨ Sweating.  ¨ Increased heart rate.  ¨ Headache.  ¨ Dry mouth.  ¨ Hunger.  ¨ Irritability.  ¨ Anxiety.  ¨ Restless sleep.  ¨ Altered speech or coordination.  ¨ Confusion.  · Treat hypoglycemia promptly. If you are alert and able to safely swallow, follow the 15:15 rule:  ¨ Take 15-20 grams of rapid-acting glucose or carbohydrate. Rapid-acting options include glucose gel, glucose tablets, or 4 ounces (120 mL) of fruit juice, regular soda, or low-fat milk.  ¨ Check your blood glucose level 15 minutes after taking the glucose.  ¨ Take 15-20  grams more of glucose if the repeat blood glucose level is still 70 mg/dL or below.  ¨ Eat a meal or snack within 1 hour once blood glucose levels return to normal.  · Be alert to polyuria (excess urination) and polydipsia (excess thirst) which are early signs of hyperglycemia. An early awareness of hyperglycemia allows for prompt treatment. Treat hyperglycemia as directed by your health care provider.  · Engage in at least 30 minutes of physical activity a day or as directed by your health care provider. Ten minutes of physical activity timed 30 minutes after each meal is encouraged to control postprandial blood glucose levels.  · Adjust your insulin dosing and food intake as needed if you start a new exercise or sport.  · Follow your sick-day plan at any time you are unable to eat or drink as usual.  · Avoid tobacco and alcohol use.  · Keep all follow-up visits as directed   by your health care provider.  · Follow the advice of your health care provider regarding your prenatal and post-delivery (postpartum) appointments, meal planning, exercise, medicines, vitamins, blood tests, other medical tests, and physical activities.  · Perform daily skin and foot care. Examine your skin and feet daily for cuts, bruises, redness, nail problems, bleeding, blisters, or sores.  · Brush your teeth and gums at least twice a day and floss at least once a day. Follow up with your dentist regularly.  · Schedule an eye exam during the first trimester of your pregnancy or as directed by your health care provider.  · Share your diabetes management plan with your workplace or school.  · Stay up-to-date with immunizations.  · Learn to manage stress.  · Obtain ongoing diabetes education and support as needed.  · Learn about and consider breastfeeding your baby.  · You should have your blood sugar level checked 6-12 weeks after delivery. This is done with an oral glucose tolerance test (OGTT).  SEEK MEDICAL CARE IF:   · You are unable to  eat food or drink fluids for more than 6 hours.  · You have nausea and vomiting for more than 6 hours.  · You have a blood glucose level of 200 mg/dL and you have ketones in your urine.  · There is a change in mental status.  · You develop vision problems.  · You have a persistent headache.  · You have upper abdominal pain or discomfort.  · You develop an additional serious illness.  · You have diarrhea for more than 6 hours.  · You have been sick or have had a fever for a couple of days and are not getting better.  SEEK IMMEDIATE MEDICAL CARE IF:   · You have difficulty breathing.  · You no longer feel the baby moving.  · You are bleeding or have discharge from your vagina.  · You start having premature contractions or labor.  MAKE SURE YOU:  · Understand these instructions.  · Will watch your condition.  · Will get help right away if you are not doing well or get worse.     This information is not intended to replace advice given to you by your health care provider. Make sure you discuss any questions you have with your health care provider.     Document Released: 11/12/2000 Document Revised: 08/27/2014 Document Reviewed: 03/04/2012  Elsevier Interactive Patient Education ©2016 [employer]

## 2015-12-26 NOTE — Progress Notes (Signed)
S/p BMZ 1 dose on 5/5 NST reactive today, BPP 8/8/ om 5/6  Subjective:BG up after BMZ  Monique Schmidt is a 30 y.o. [id number] at [redacted]w[redacted]d being seen today for ongoing prenatal care.  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  Patient reports no complaints.  Contractions: Irregular. Vag. Bleeding: None.  Movement: (!) Decreased. Denies leaking of fluid. Notes nl FM now  The following portions of the patient's history were reviewed and updated as appropriate: allergies, current medications, past family history, past medical history, past social history, past surgical history and problem list. Problem list updated.  Objective:   Filed Vitals:   12/26/15 1205  BP: 115/82  Pulse: 91  Weight: 322 lb 8 oz (146.285 kg)    Fetal Status: Fetal Heart Rate (bpm): NST   Movement: (!) Decreased     General:  Alert, oriented and cooperative. Patient is in no acute distress.  Skin: Skin is warm and dry. No rash noted.   Cardiovascular: Normal heart rate noted  Respiratory: Normal respiratory effort, no problems with respiration noted  Abdomen: Soft, gravid, appropriate for gestational age. Pain/Pressure: Present     Pelvic: Vag. Bleeding: None     Cervical exam deferred        Extremities: Normal range of motion.  Edema: Trace  Mental Status: Normal mood and affect. Normal behavior. Normal judgment and thought content.   Urinalysis:      Assessment and Plan:  Pregnancy: [id number] at 5646w6d  1. Diabetes mellitus in pregnancy, antepartum,  third trimester Elevated FBS after BMZ will follow without changing meds for now  2. Supervision of high-risk pregnancy, third trimester NST reactive   Preterm labor symptoms and general obstetric precautions including but not limited to vaginal bleeding, contractions, leaking of fluid and fetal movement were reviewed in detail with the patient. Please refer to After Visit Summary for other counseling recommendations.  Return in about 4 days (around 12/30/2015).   Adam PhenixJames G Jehan Ranganathan, MD

## 2015-12-29 MED ORDER — ASPIRIN 81 MG PO CHEW: 81.0000 mg | CHEWABLE_TABLET | Freq: Every day | ORAL | Status: DC

## 2015-12-29 NOTE — Progress Notes (Signed)
Pt left clinic before being seen by provider today

## 2015-12-30 ENCOUNTER — Ambulatory Visit (INDEPENDENT_AMBULATORY_CARE_PROVIDER_SITE_OTHER): Payer: Medicaid Other | Admitting: *Deleted

## 2015-12-30 VITALS — BP 120/94 | HR 115

## 2015-12-30 DIAGNOSIS — O24913 Unspecified diabetes mellitus in pregnancy, third trimester: Secondary | ICD-10-CM

## 2015-12-30 DIAGNOSIS — Z36 Encounter for antenatal screening of mother: Secondary | ICD-10-CM | POA: Diagnosis not present

## 2015-12-30 NOTE — Progress Notes (Signed)
NST today reactive 

## 2016-01-02 ENCOUNTER — Other Ambulatory Visit (HOSPITAL_COMMUNITY)
Admission: RE | Admit: 2016-01-02 | Discharge: 2016-01-02 | Disposition: A | Discharge status: Home / Self Care | Payer: Medicaid Other | Source: Ambulatory Visit | Attending: Obstetrics and Gynecology | Admitting: Obstetrics and Gynecology

## 2016-01-02 ENCOUNTER — Ambulatory Visit (INDEPENDENT_AMBULATORY_CARE_PROVIDER_SITE_OTHER): Payer: Medicaid Other | Admitting: Obstetrics and Gynecology

## 2016-01-02 ENCOUNTER — Encounter: Payer: Self-pay | Admitting: Obstetrics and Gynecology

## 2016-01-02 VITALS — BP 113/62 | HR 109 | Wt 320.3 lb

## 2016-01-02 DIAGNOSIS — O0993 Supervision of high risk pregnancy, unspecified, third trimester: Secondary | ICD-10-CM | POA: Diagnosis not present

## 2016-01-02 DIAGNOSIS — E669 Obesity, unspecified: Secondary | ICD-10-CM | POA: Diagnosis not present

## 2016-01-02 DIAGNOSIS — Z113 Encounter for screening for infections with a predominantly sexual mode of transmission: Secondary | ICD-10-CM | POA: Diagnosis present

## 2016-01-02 DIAGNOSIS — O24913 Unspecified diabetes mellitus in pregnancy, third trimester: Secondary | ICD-10-CM | POA: Diagnosis not present

## 2016-01-02 DIAGNOSIS — O99213 Obesity complicating pregnancy, third trimester: Secondary | ICD-10-CM | POA: Diagnosis present

## 2016-01-02 DIAGNOSIS — D696 Thrombocytopenia, unspecified: Secondary | ICD-10-CM

## 2016-01-02 DIAGNOSIS — O99323 Drug use complicating pregnancy, third trimester: Secondary | ICD-10-CM

## 2016-01-02 DIAGNOSIS — F1110 Opioid abuse, uncomplicated: Secondary | ICD-10-CM | POA: Diagnosis not present

## 2016-01-02 DIAGNOSIS — O99113 Other diseases of the blood and blood-forming organs and certain disorders involving the immune mechanism complicating pregnancy, third trimester: Secondary | ICD-10-CM | POA: Diagnosis not present

## 2016-01-02 LAB — OB RESULTS CONSOLE GBS: STREP GROUP B AG: NEGATIVE

## 2016-01-02 MED ORDER — CYCLOBENZAPRINE HCL 10 MG PO TABS: 10.0000 mg | ORAL_TABLET | Freq: Three times a day (TID) | ORAL | Status: DC | PRN

## 2016-01-02 NOTE — Progress Notes (Signed)
Subjective:  Monique Schmidt is a 30 y.o. [phone] at 3649w6d being seen today for ongoing prenatal care.  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  Patient reports headache.  Contractions: Irregular. Vag. Bleeding: None.  Movement: Present. Denies leaking of fluid.   The following portions of the patient's history were reviewed and updated as appropriate: allergies, current medications, past family history, past medical history, past social history, past surgical history and problem list. Problem list updated.  Objective:   Filed Vitals:   01/02/16 1129  BP: 113/62  Pulse: 109  Weight: 320 lb 4.8 oz (145.287 kg)    Fetal Status: Fetal Heart Rate (bpm): NST   Movement: Present     General:  Alert, oriented and cooperative. Patient is in no acute distress.  Skin: Skin is warm and dry. No rash noted.   Cardiovascular: Normal heart rate noted  Respiratory: Normal respiratory effort, no problems with respiration noted  Abdomen: Soft, gravid, appropriate for gestational age. Pain/Pressure: Present     Pelvic: Vag. Bleeding: None     Cervical exam performed        Extremities: Normal range of motion.  Edema: None  Mental Status: Normal mood and affect. Normal behavior. Normal judgment and thought content.   Urinalysis:      Assessment and Plan:  Pregnancy: [id number] at 8749w6d  1. Narcotic use in pregnancy   2. Diabetes mellitus in pregnancy, antepartum, third trimester CBGs reviewed and majority within range- Patient admits to  not always adhering to the diet. She forgot to take her NPH last night resulting in a fasting 127. Follow up growth ultrasound Continue current NPH dosage with compliance with diet and medication NST reviewed and reactive  3. Obesity in pregnancy, antepartum, third trimester   4. Supervision of high-risk pregnancy, third trimester Cultures today Rx flexeril provided to help with HA in addition to fioricet.  Patient with h/o migraine headaches  Preterm labor symptoms and general obstetric precautions including but not limited to vaginal bleeding, contractions, leaking of fluid and fetal movement were reviewed in detail with the patient. Please refer to After Visit Summary for other counseling recommendations.  No Follow-up on file.   Catalina AntiguaPeggy Deylan Canterbury, MD

## 2016-01-02 NOTE — Progress Notes (Signed)
Pt states she has had a headache x3 days with no relief from Fioricet and she has not been able to sleep also.

## 2016-01-03 LAB — GC/CHLAMYDIA PROBE AMP (~~LOC~~) NOT AT ARMC
CHLAMYDIA, DNA PROBE: NEGATIVE
NEISSERIA GONORRHEA: NEGATIVE

## 2016-01-05 ENCOUNTER — Ambulatory Visit (HOSPITAL_COMMUNITY)
Admission: RE | Admit: 2016-01-05 | Discharge: 2016-01-05 | Disposition: A | Discharge status: Home / Self Care | Payer: Medicaid Other | Source: Ambulatory Visit | Attending: Obstetrics & Gynecology | Admitting: Obstetrics & Gynecology

## 2016-01-05 ENCOUNTER — Encounter (HOSPITAL_COMMUNITY): Payer: Self-pay

## 2016-01-05 VITALS — BP 124/87 | HR 104 | Wt 324.6 lb

## 2016-01-05 DIAGNOSIS — O99210 Obesity complicating pregnancy, unspecified trimester: Secondary | ICD-10-CM | POA: Insufficient documentation

## 2016-01-05 DIAGNOSIS — O24913 Unspecified diabetes mellitus in pregnancy, third trimester: Secondary | ICD-10-CM

## 2016-01-05 DIAGNOSIS — Z302 Encounter for sterilization: Secondary | ICD-10-CM

## 2016-01-05 DIAGNOSIS — O0993 Supervision of high risk pregnancy, unspecified, third trimester: Secondary | ICD-10-CM

## 2016-01-05 DIAGNOSIS — O09299 Supervision of pregnancy with other poor reproductive or obstetric history, unspecified trimester: Secondary | ICD-10-CM | POA: Insufficient documentation

## 2016-01-05 DIAGNOSIS — O24113 Pre-existing diabetes mellitus, type 2, in pregnancy, third trimester: Secondary | ICD-10-CM | POA: Diagnosis present

## 2016-01-05 DIAGNOSIS — Z3A36 36 weeks gestation of pregnancy: Secondary | ICD-10-CM | POA: Insufficient documentation

## 2016-01-05 DIAGNOSIS — O368990 Maternal care for other specified fetal problems, unspecified trimester, not applicable or unspecified: Secondary | ICD-10-CM

## 2016-01-05 LAB — CULTURE, BETA STREP (GROUP B ONLY)

## 2016-01-08 ENCOUNTER — Encounter (HOSPITAL_COMMUNITY): Payer: Self-pay | Admitting: Certified Nurse Midwife

## 2016-01-08 ENCOUNTER — Inpatient Hospital Stay (HOSPITAL_COMMUNITY)
Admission: AD | Admit: 2016-01-08 | Discharge: 2016-01-08 | Disposition: A | Discharge status: Home / Self Care | Payer: Medicaid Other | Source: Ambulatory Visit | Attending: Obstetrics and Gynecology | Admitting: Obstetrics and Gynecology

## 2016-01-08 ENCOUNTER — Inpatient Hospital Stay (HOSPITAL_COMMUNITY): Payer: Medicaid Other

## 2016-01-08 DIAGNOSIS — O368190 Decreased fetal movements, unspecified trimester, not applicable or unspecified: Secondary | ICD-10-CM

## 2016-01-08 DIAGNOSIS — O288 Other abnormal findings on antenatal screening of mother: Secondary | ICD-10-CM

## 2016-01-08 DIAGNOSIS — Z3A36 36 weeks gestation of pregnancy: Secondary | ICD-10-CM | POA: Insufficient documentation

## 2016-01-08 DIAGNOSIS — O368130 Decreased fetal movements, third trimester, not applicable or unspecified: Secondary | ICD-10-CM | POA: Insufficient documentation

## 2016-01-08 MED ORDER — SODIUM CHLORIDE 0.9 % IV SOLN: Freq: Once | INTRAVENOUS | Status: DC

## 2016-01-08 NOTE — MAU Note (Signed)
Pt states she has not felt baby move since 8PM last night. Pt denies ctxs or vaginal bleeding. Pt states she is having a smelly discharge.

## 2016-01-08 NOTE — Progress Notes (Signed)
Called to BS STAT at 0855 for BS US due to RN not being able to find FHR on pt reporting no FM x 12 hours. Able to visual cardiac activity at normal rate and positive fetal mvmt on informal BS US. Grossly normal fluid. Exam limited by maternal body habitus. RN able to trace FHR w/ EFM. Zerita Boersarlene Lawson CNM given report, assumed care of pt.   AliciaVirginia Perri Lamagna, [place] 01/08/2016 10:32 AM

## 2016-01-08 NOTE — Discharge Instructions (Signed)
Vaginal Delivery °During delivery, your health care provider will help you give birth to your baby. During a vaginal delivery, you will work to push the baby out of your vagina. However, before you can push your baby out, a few things need to happen. The opening of your uterus (cervix) has to soften, thin out, and open up (dilate) all the way to 10 cm. Also, your baby has to move down from the uterus into your vagina.  °SIGNS OF LABOR  °Your health care provider will first need to make sure you are in labor. Signs of labor include:  °· Passing what is called the mucous plug before labor begins. This is a small amount of blood-stained mucus. °· Having regular, painful uterine contractions.   °· The time between contractions gets shorter.   °· The discomfort and pain gradually get more intense. °· Contraction pains get worse when walking and do not go away when resting.   °· Your cervix becomes thinner (effacement) and dilates. °BEFORE THE DELIVERY °Once you are in labor and admitted into the hospital or care center, your health care provider may do the following:  °· Perform a complete physical exam. °· Review any complications related to pregnancy or labor.  °· Check your blood pressure, pulse, temperature, and heart rate (vital signs).   °· Determine if, and when, the rupture of amniotic membranes occurred. °· Do a vaginal exam (using a sterile glove and lubricant) to determine:   °¨ The position (presentation) of the baby. Is the baby's head presenting first (vertex) in the birth canal (vagina), or are the feet or buttocks first (breech)?   °¨ The level (station) of the baby's head within the birth canal.   °¨ The effacement and dilatation of the cervix.   °· An electronic fetal monitor is usually placed on your abdomen when you first arrive. This is used to monitor your contractions and the baby's heart rate. °¨ When the monitor is on your abdomen (external fetal monitor), it can only pick up the frequency and  length of your contractions. It cannot tell the strength of your contractions. °¨ If it becomes necessary for your health care provider to know exactly how strong your contractions are or to see exactly what the baby's heart rate is doing, an internal monitor may be inserted into your vagina and uterus. Your health care provider will discuss the benefits and risks of using an internal monitor and obtain your permission before inserting the device. °¨ Continuous fetal monitoring may be needed if you have an epidural, are receiving certain medicines (such as oxytocin), or have pregnancy or labor complications. °· An IV access tube may be placed into a vein in your arm to deliver fluids and medicines if necessary. °THREE STAGES OF LABOR AND DELIVERY °Normal labor and delivery is divided into three stages. °First Stage °This stage starts when you begin to contract regularly and your cervix begins to efface and dilate. It ends when your cervix is completely open (fully dilated). The first stage is the longest stage of labor and can last from 3 hours to 15 hours.  °Several methods are available to help with labor pain. You and your health care provider will decide which option is best for you. Options include:  °· Opioid medicines. These are strong pain medicines that you can get through your IV tube or as a shot into your muscle. These medicines lessen pain but do not make it go away completely.  °· Epidural. A medicine is given through a thin tube that   is inserted in your back. The medicine numbs the lower part of your body and prevents any pain in that area. °· Paracervical pain medicine. This is an injection of an anesthetic on each side of your cervix.   °· You may request natural childbirth, which does not involve the use of pain medicines or an epidural during labor and delivery. Instead, you will use other things, such as breathing exercises, to help cope with the pain. °Second Stage °The second stage of labor  begins when your cervix is fully dilated at 10 cm. It continues until you push your baby down through the birth canal and the baby is born. This stage can take only minutes or several hours. °· The location of your baby's head as it moves through the birth canal is reported as a number called a station. If the baby's head has not started its descent, the station is described as being at minus 3 (-3). When your baby's head is at the zero station, it is at the middle of the birth canal and is engaged in the pelvis. The station of your baby helps indicate the progress of the second stage of labor. °· When your baby is born, your health care provider may hold the baby with his or her head lowered to prevent amniotic fluid, mucus, and blood from getting into the baby's lungs. The baby's mouth and nose may be suctioned with a small bulb syringe to remove any additional fluid. °· Your health care provider may then place the baby on your stomach. It is important to keep the baby from getting cold. To do this, the health care provider will dry the baby off, place the baby directly on your skin (with no blankets between you and the baby), and cover the baby with warm, dry blankets.   °· The umbilical cord is cut. °Third Stage °During the third stage of labor, your health care provider will deliver the placenta (afterbirth) and make sure your bleeding is under control. The delivery of the placenta usually takes about 5 minutes but can take up to 30 minutes. After the placenta is delivered, a medicine may be given either by IV or injection to help contract the uterus and control bleeding. If you are planning to breastfeed, you can try to do so now. °After you deliver the placenta, your uterus should contract and get very firm. If your uterus does not remain firm, your health care provider will massage it. This is important because the contraction of the uterus helps cut off bleeding at the site where the placenta was attached  to your uterus. If your uterus does not contract properly and stay firm, you may continue to bleed heavily. If there is a lot of bleeding, medicines may be given to contract the uterus and stop the bleeding.  °  °This information is not intended to replace advice given to you by your health care provider. Make sure you discuss any questions you have with your health care provider. °  °Document Released: 05/15/2008 Document Revised: 08/27/2014 Document Reviewed: 04/02/2012 °Elsevier Interactive Patient Education ©2016 [employer] °Fetal Movement Counts °Patient Name: __________________________________________________ Patient Due Date: ____________________ °Performing a fetal movement count is highly recommended in high-risk pregnancies, but it is good for every pregnant woman to do. Your health care provider may ask you to start counting fetal movements at 28 weeks of the pregnancy. Fetal movements often increase: °· After eating a full meal. °· After physical activity. °· After eating or drinking   something sweet or cold. °· At rest. °Pay attention to when you feel the baby is most active. This will help you notice a pattern of your baby's sleep and wake cycles and what factors contribute to an increase in fetal movement. It is important to perform a fetal movement count at the same time each day when your baby is normally most active.  °HOW TO COUNT FETAL MOVEMENTS °· Find a quiet and comfortable area to sit or lie down on your left side. Lying on your left side provides the best blood and oxygen circulation to your baby. °· Write down the day and time on a sheet of paper or in a journal. °· Start counting kicks, flutters, swishes, rolls, or jabs in a 2-hour period. You should feel at least 10 movements within 2 hours. °· If you do not feel 10 movements in 2 hours, wait 2-3 hours and count again. Look for a change in the pattern or not enough counts in 2 hours. °SEEK MEDICAL CARE IF: °· You feel less than 10  counts in 2 hours, tried twice. °· There is no movement in over an hour. °· The pattern is changing or taking longer each day to reach 10 counts in 2 hours. °· You feel the baby is not moving as he or she usually does. °Date: ____________ Movements: ____________ Start time: ____________ Finish time: ____________  °Date: ____________ Movements: ____________ Start time: ____________ Finish time: ____________ °Date: ____________ Movements: ____________ Start time: ____________ Finish time: ____________ °Date: ____________ Movements: ____________ Start time: ____________ Finish time: ____________ °Date: ____________ Movements: ____________ Start time: ____________ Finish time: ____________ °Date: ____________ Movements: ____________ Start time: ____________ Finish time: ____________ °Date: ____________ Movements: ____________ Start time: ____________ Finish time: ____________ °Date: ____________ Movements: ____________ Start time: ____________ Finish time: ____________  °Date: ____________ Movements: ____________ Start time: ____________ Finish time: ____________ °Date: ____________ Movements: ____________ Start time: ____________ Finish time: ____________ °Date: ____________ Movements: ____________ Start time: ____________ Finish time: ____________ °Date: ____________ Movements: ____________ Start time: ____________ Finish time: ____________ °Date: ____________ Movements: ____________ Start time: ____________ Finish time: ____________ °Date: ____________ Movements: ____________ Start time: ____________ Finish time: ____________ °Date: ____________ Movements: ____________ Start time: ____________ Finish time: ____________  °Date: ____________ Movements: ____________ Start time: ____________ Finish time: ____________ °Date: ____________ Movements: ____________ Start time: ____________ Finish time: ____________ °Date: ____________ Movements: ____________ Start time: ____________ Finish time: ____________ °Date:  ____________ Movements: ____________ Start time: ____________ Finish time: ____________ °Date: ____________ Movements: ____________ Start time: ____________ Finish time: ____________ °Date: ____________ Movements: ____________ Start time: ____________ Finish time: ____________ °Date: ____________ Movements: ____________ Start time: ____________ Finish time: ____________  °Date: ____________ Movements: ____________ Start time: ____________ Finish time: ____________ °Date: ____________ Movements: ____________ Start time: ____________ Finish time: ____________ °Date: ____________ Movements: ____________ Start time: ____________ Finish time: ____________ °Date: ____________ Movements: ____________ Start time: ____________ Finish time: ____________ °Date: ____________ Movements: ____________ Start time: ____________ Finish time: ____________ °Date: ____________ Movements: ____________ Start time: ____________ Finish time: ____________ °Date: ____________ Movements: ____________ Start time: ____________ Finish time: ____________  °Date: ____________ Movements: ____________ Start time: ____________ Finish time: ____________ °Date: ____________ Movements: ____________ Start time: ____________ Finish time: ____________ °Date: ____________ Movements: ____________ Start time: ____________ Finish time: ____________ °Date: ____________ Movements: ____________ Start time: ____________ Finish time: ____________ °Date: ____________ Movements: ____________ Start time: ____________ Finish time: ____________ °Date: ____________ Movements: ____________ Start time: ____________ Finish time: ____________ °Date: ____________ Movements: ____________ Start time: ____________ Finish time: ____________  °Date: ____________ Movements: ____________ Start   time: ____________ Finish time: ____________ °Date: ____________ Movements: ____________ Start time: ____________ Finish time: ____________ °Date: ____________ Movements: ____________ Start  time: ____________ Finish time: ____________ °Date: ____________ Movements: ____________ Start time: ____________ Finish time: ____________ °Date: ____________ Movements: ____________ Start time: ____________ Finish time: ____________ °Date: ____________ Movements: ____________ Start time: ____________ Finish time: ____________ °Date: ____________ Movements: ____________ Start time: ____________ Finish time: ____________  °Date: ____________ Movements: ____________ Start time: ____________ Finish time: ____________ °Date: ____________ Movements: ____________ Start time: ____________ Finish time: ____________ °Date: ____________ Movements: ____________ Start time: ____________ Finish time: ____________ °Date: ____________ Movements: ____________ Start time: ____________ Finish time: ____________ °Date: ____________ Movements: ____________ Start time: ____________ Finish time: ____________ °Date: ____________ Movements: ____________ Start time: ____________ Finish time: ____________ °Date: ____________ Movements: ____________ Start time: ____________ Finish time: ____________  °Date: ____________ Movements: ____________ Start time: ____________ Finish time: ____________ °Date: ____________ Movements: ____________ Start time: ____________ Finish time: ____________ °Date: ____________ Movements: ____________ Start time: ____________ Finish time: ____________ °Date: ____________ Movements: ____________ Start time: ____________ Finish time: ____________ °Date: ____________ Movements: ____________ Start time: ____________ Finish time: ____________ °Date: ____________ Movements: ____________ Start time: ____________ Finish time: ____________ °  °This information is not intended to replace advice given to you by your health care provider. Make sure you discuss any questions you have with your health care provider. °  °Document Released: 09/05/2006 Document Revised: 08/27/2014 Document Reviewed: 06/02/2012 °Elsevier  Interactive Patient Education ©2016 [employer] ° °

## 2016-01-08 NOTE — MAU Provider Note (Addendum)
History   G6 P4014 in with decreased fetal movement for 12 hours. Upon arrival to unit and monitor placed pt is feeling movement.  CSN: [id number]  Arrival date & time 01/08/16  [id number]   First Provider Initiated Contact with Patient 01/08/16 [phone]      Chief Complaint  Patient presents with  . Decreased Fetal Movement    HPI  Past Medical History  Diagnosis Date  . Abnormal Pap smear   . Gestational diabetes     all pregnancies glyburide started 11/4  . Thrombocytopenia (HCC)   . Headache(784.0)   . Vaginal Pap smear, abnormal   . Hernia, umbilical     Past Surgical History  Procedure Laterality Date  . Wisdom tooth extraction    . Umbilical hernia repair N/A 11/22/2014    Procedure: LAPAROSCOPIC UMBILICAL HERNIA;  Surgeon: Axel FillerArmando Ramirez, MD;  Location: [facility] OR;  Service: General;  Laterality: N/A;    Family History  Problem Relation Age of Onset  . Hypertension Father   . Diabetes Father   . Diabetes Mother   . Seizures Brother     Social History  Substance Use Topics  . Smoking status: Never Smoker   . Smokeless tobacco: Never Used  . Alcohol Use: No    OB History    Gravida Para Term Preterm AB TAB SAB Ectopic Multiple Living   6 4 4  1  1   4       Review of Systems  Constitutional: Negative.   HENT: Negative.   Eyes: Negative.   Respiratory: Negative.   Cardiovascular: Negative.   Gastrointestinal: Negative.   Endocrine: Negative.   Genitourinary: Negative.   Musculoskeletal: Negative.   Skin: Negative.   Allergic/Immunologic: Negative.   Neurological: Negative.   Hematological: Negative.   Psychiatric/Behavioral: Negative.     Allergies  Latex and Penicillins  Home Medications  No current outpatient prescriptions on file.  BP 104/73 mmHg  Pulse 103  Temp(Src) 98.1 F (36.7 C) (Oral)  Resp 18  LMP  (LMP Unknown)  Physical Exam  Constitutional: She is oriented to person, place, and time. She appears well-developed and well-nourished.   HENT:  Head: Normocephalic.  Eyes: Pupils are equal, round, and reactive to light.  Neck: Normal range of motion.  Cardiovascular: Normal rate, regular rhythm, normal heart sounds and intact distal pulses.   Pulmonary/Chest: Effort normal and breath sounds normal.  Abdominal: Soft. Bowel sounds are normal.  Genitourinary: Vagina normal.  Musculoskeletal: Normal range of motion.  Neurological: She is alert and oriented to person, place, and time. She has normal reflexes.  Skin: Skin is warm and dry.  Psychiatric: She has a normal mood and affect. Her behavior is normal. Judgment and thought content normal.    MAU Course  Procedures (including critical care time)  Labs Reviewed - No data to display No results found.   1. Decreased fetal movement, third trimester, not applicable or unspecified fetus   2. NST (non-stress test) nonreactive   3. [redacted] weeks gestation of pregnancy       MDM  Dx: decreased fetal movement BPP 10/10, POC discussed with Dr. Emelda FearFerguson pt to be discharged home. \  Jferguson ; I interviewed the patient and confirmed fetal movement being felt , and NST monitoring normal

## 2016-01-09 ENCOUNTER — Other Ambulatory Visit: Payer: Medicaid Other

## 2016-01-13 ENCOUNTER — Other Ambulatory Visit: Payer: Self-pay | Admitting: Family Medicine

## 2016-01-13 ENCOUNTER — Ambulatory Visit (INDEPENDENT_AMBULATORY_CARE_PROVIDER_SITE_OTHER): Payer: Medicaid Other | Admitting: *Deleted

## 2016-01-13 ENCOUNTER — Ambulatory Visit (HOSPITAL_COMMUNITY)
Admission: RE | Admit: 2016-01-13 | Discharge: 2016-01-13 | Disposition: A | Discharge status: Home / Self Care | Payer: Medicaid Other | Source: Ambulatory Visit | Attending: Obstetrics & Gynecology | Admitting: Obstetrics & Gynecology

## 2016-01-13 ENCOUNTER — Encounter (HOSPITAL_COMMUNITY): Payer: Self-pay

## 2016-01-13 VITALS — BP 128/0 | HR 97 | Wt 328.4 lb

## 2016-01-13 VITALS — BP 141/78 | HR 101

## 2016-01-13 DIAGNOSIS — O99213 Obesity complicating pregnancy, third trimester: Secondary | ICD-10-CM

## 2016-01-13 DIAGNOSIS — Z3A37 37 weeks gestation of pregnancy: Secondary | ICD-10-CM

## 2016-01-13 DIAGNOSIS — O24113 Pre-existing diabetes mellitus, type 2, in pregnancy, third trimester: Secondary | ICD-10-CM | POA: Diagnosis not present

## 2016-01-13 DIAGNOSIS — O368130 Decreased fetal movements, third trimester, not applicable or unspecified: Secondary | ICD-10-CM | POA: Diagnosis present

## 2016-01-13 DIAGNOSIS — O24913 Unspecified diabetes mellitus in pregnancy, third trimester: Secondary | ICD-10-CM | POA: Diagnosis present

## 2016-01-13 DIAGNOSIS — Z302 Encounter for sterilization: Secondary | ICD-10-CM

## 2016-01-13 DIAGNOSIS — O283 Abnormal ultrasonic finding on antenatal screening of mother: Secondary | ICD-10-CM | POA: Diagnosis not present

## 2016-01-13 DIAGNOSIS — O24919 Unspecified diabetes mellitus in pregnancy, unspecified trimester: Secondary | ICD-10-CM

## 2016-01-13 DIAGNOSIS — O0990 Supervision of high risk pregnancy, unspecified, unspecified trimester: Secondary | ICD-10-CM

## 2016-01-13 DIAGNOSIS — O368990 Maternal care for other specified fetal problems, unspecified trimester, not applicable or unspecified: Secondary | ICD-10-CM

## 2016-01-13 DIAGNOSIS — O24319 Unspecified pre-existing diabetes mellitus in pregnancy, unspecified trimester: Secondary | ICD-10-CM

## 2016-01-13 NOTE — Progress Notes (Signed)
NST reviewed and reactive.  

## 2016-01-13 NOTE — Progress Notes (Signed)
US for growth & BPP today 

## 2016-01-18 ENCOUNTER — Encounter: Payer: Self-pay | Admitting: General Practice

## 2016-01-18 ENCOUNTER — Ambulatory Visit (INDEPENDENT_AMBULATORY_CARE_PROVIDER_SITE_OTHER): Payer: Medicaid Other | Admitting: *Deleted

## 2016-01-18 ENCOUNTER — Telehealth (HOSPITAL_COMMUNITY): Payer: Self-pay | Admitting: *Deleted

## 2016-01-18 VITALS — BP 140/88

## 2016-01-18 DIAGNOSIS — O24913 Unspecified diabetes mellitus in pregnancy, third trimester: Secondary | ICD-10-CM | POA: Diagnosis not present

## 2016-01-18 NOTE — Telephone Encounter (Signed)
Preadmission screen  

## 2016-01-18 NOTE — Progress Notes (Signed)
Pt denies H/A or visual disturbances.  

## 2016-01-18 NOTE — Progress Notes (Signed)
NST 01/18/16 reactive 

## 2016-01-20 ENCOUNTER — Encounter (HOSPITAL_COMMUNITY): Payer: Self-pay | Admitting: *Deleted

## 2016-01-20 ENCOUNTER — Ambulatory Visit (INDEPENDENT_AMBULATORY_CARE_PROVIDER_SITE_OTHER): Payer: Medicaid Other | Admitting: Family Medicine

## 2016-01-20 ENCOUNTER — Inpatient Hospital Stay (HOSPITAL_COMMUNITY)
Admission: AD | Admit: 2016-01-20 | Discharge: 2016-01-22 | DRG: 775 | Disposition: A | Discharge status: Home / Self Care | Payer: Medicaid Other | Source: Ambulatory Visit | Attending: Obstetrics & Gynecology | Admitting: Obstetrics & Gynecology

## 2016-01-20 VITALS — BP 147/92 | HR 109 | Wt 325.8 lb

## 2016-01-20 DIAGNOSIS — O9952 Diseases of the respiratory system complicating childbirth: Secondary | ICD-10-CM | POA: Diagnosis present

## 2016-01-20 DIAGNOSIS — O9912 Other diseases of the blood and blood-forming organs and certain disorders involving the immune mechanism complicating childbirth: Secondary | ICD-10-CM | POA: Diagnosis present

## 2016-01-20 DIAGNOSIS — O24913 Unspecified diabetes mellitus in pregnancy, third trimester: Secondary | ICD-10-CM

## 2016-01-20 DIAGNOSIS — R030 Elevated blood-pressure reading, without diagnosis of hypertension: Secondary | ICD-10-CM | POA: Diagnosis not present

## 2016-01-20 DIAGNOSIS — D6959 Other secondary thrombocytopenia: Secondary | ICD-10-CM | POA: Diagnosis present

## 2016-01-20 DIAGNOSIS — Z36 Encounter for antenatal screening of mother: Secondary | ICD-10-CM | POA: Diagnosis not present

## 2016-01-20 DIAGNOSIS — O24424 Gestational diabetes mellitus in childbirth, insulin controlled: Secondary | ICD-10-CM | POA: Diagnosis present

## 2016-01-20 DIAGNOSIS — O24919 Unspecified diabetes mellitus in pregnancy, unspecified trimester: Secondary | ICD-10-CM | POA: Diagnosis present

## 2016-01-20 DIAGNOSIS — O24425 Gestational diabetes mellitus in childbirth, controlled by oral hypoglycemic drugs: Secondary | ICD-10-CM

## 2016-01-20 DIAGNOSIS — O288 Other abnormal findings on antenatal screening of mother: Secondary | ICD-10-CM

## 2016-01-20 DIAGNOSIS — O99113 Other diseases of the blood and blood-forming organs and certain disorders involving the immune mechanism complicating pregnancy, third trimester: Secondary | ICD-10-CM | POA: Diagnosis not present

## 2016-01-20 DIAGNOSIS — O0993 Supervision of high risk pregnancy, unspecified, third trimester: Secondary | ICD-10-CM

## 2016-01-20 DIAGNOSIS — O368990 Maternal care for other specified fetal problems, unspecified trimester, not applicable or unspecified: Secondary | ICD-10-CM

## 2016-01-20 DIAGNOSIS — Z6841 Body Mass Index (BMI) 40.0 and over, adult: Secondary | ICD-10-CM

## 2016-01-20 DIAGNOSIS — Z833 Family history of diabetes mellitus: Secondary | ICD-10-CM

## 2016-01-20 DIAGNOSIS — Z3A38 38 weeks gestation of pregnancy: Secondary | ICD-10-CM

## 2016-01-20 DIAGNOSIS — J4540 Moderate persistent asthma, uncomplicated: Secondary | ICD-10-CM | POA: Diagnosis present

## 2016-01-20 DIAGNOSIS — O289 Unspecified abnormal findings on antenatal screening of mother: Secondary | ICD-10-CM | POA: Diagnosis not present

## 2016-01-20 DIAGNOSIS — O1400 Mild to moderate pre-eclampsia, unspecified trimester: Secondary | ICD-10-CM | POA: Diagnosis present

## 2016-01-20 DIAGNOSIS — O99214 Obesity complicating childbirth: Secondary | ICD-10-CM | POA: Diagnosis present

## 2016-01-20 DIAGNOSIS — D696 Thrombocytopenia, unspecified: Secondary | ICD-10-CM

## 2016-01-20 DIAGNOSIS — Z8249 Family history of ischemic heart disease and other diseases of the circulatory system: Secondary | ICD-10-CM

## 2016-01-20 DIAGNOSIS — O1404 Mild to moderate pre-eclampsia, complicating childbirth: Principal | ICD-10-CM

## 2016-01-20 DIAGNOSIS — O1403 Mild to moderate pre-eclampsia, third trimester: Secondary | ICD-10-CM

## 2016-01-20 DIAGNOSIS — Z302 Encounter for sterilization: Secondary | ICD-10-CM

## 2016-01-20 DIAGNOSIS — O1494 Unspecified pre-eclampsia, complicating childbirth: Secondary | ICD-10-CM | POA: Diagnosis present

## 2016-01-20 DIAGNOSIS — IMO0001 Reserved for inherently not codable concepts without codable children: Secondary | ICD-10-CM | POA: Insufficient documentation

## 2016-01-20 LAB — GLUCOSE, CAPILLARY
Glucose-Capillary: 128 mg/dL — ABNORMAL HIGH (ref 65–99)
Glucose-Capillary: 120 mg/dL — ABNORMAL HIGH (ref 65–99)
Glucose-Capillary: 244 mg/dL — ABNORMAL HIGH (ref 65–99)

## 2016-01-20 LAB — URINALYSIS, ROUTINE W REFLEX MICROSCOPIC
Bilirubin Urine: NEGATIVE
GLUCOSE, UA: NEGATIVE mg/dL
Hgb urine dipstick: NEGATIVE
KETONES UR: 40 mg/dL — AB
LEUKOCYTES UA: NEGATIVE
NITRITE: NEGATIVE
PROTEIN: 30 mg/dL — AB
Specific Gravity, Urine: 1.025 (ref 1.005–1.030)
pH: 6 (ref 5.0–8.0)

## 2016-01-20 LAB — CBC
HCT: 36.8 % (ref 36.0–46.0)
HEMOGLOBIN: 12.3 g/dL (ref 12.0–15.0)
MCH: 25.6 pg — AB (ref 26.0–34.0)
MCHC: 33.4 g/dL (ref 30.0–36.0)
MCV: 76.7 fL — ABNORMAL LOW (ref 78.0–100.0)
Platelets: 101 10*3/uL — ABNORMAL LOW (ref 150–400)
RBC: 4.8 MIL/uL (ref 3.87–5.11)
RDW: 16.8 % — ABNORMAL HIGH (ref 11.5–15.5)
WBC: 7.7 10*3/uL (ref 4.0–10.5)

## 2016-01-20 LAB — COMPREHENSIVE METABOLIC PANEL
ALK PHOS: 174 U/L — AB (ref 38–126)
ALT: 38 U/L (ref 14–54)
AST: 33 U/L (ref 15–41)
Albumin: 2.7 g/dL — ABNORMAL LOW (ref 3.5–5.0)
Anion gap: 11 (ref 5–15)
BUN: 7 mg/dL (ref 6–20)
CALCIUM: 8.6 mg/dL — AB (ref 8.9–10.3)
CO2: 21 mmol/L — AB (ref 22–32)
CREATININE: 0.6 mg/dL (ref 0.44–1.00)
Chloride: 103 mmol/L (ref 101–111)
GFR calc non Af Amer: >60 mL/min (ref >60)
GLUCOSE: 187 mg/dL — AB (ref 65–99)
Potassium: 3.2 mmol/L — ABNORMAL LOW (ref 3.5–5.1)
SODIUM: 135 mmol/L (ref 135–145)
Total Bilirubin: 0.5 mg/dL (ref 0.3–1.2)
Total Protein: 6.8 g/dL (ref 6.5–8.1)

## 2016-01-20 LAB — TYPE AND SCREEN
ABO/RH(D): A POS
ANTIBODY SCREEN: NEGATIVE

## 2016-01-20 LAB — PROTEIN / CREATININE RATIO, URINE
CREATININE, URINE: 241 mg/dL
PROTEIN CREATININE RATIO: 0.31 mg/mg{creat} — AB (ref 0.00–0.15)
TOTAL PROTEIN, URINE: 74 mg/dL

## 2016-01-20 LAB — URINE MICROSCOPIC-ADD ON: RBC / HPF: NONE SEEN RBC/hpf (ref 0–5)

## 2016-01-20 LAB — ABO/RH: ABO/RH(D): A POS

## 2016-01-20 MED ORDER — DIBUCAINE 1 % RE OINT: 1.0000 "application " | TOPICAL_OINTMENT | RECTAL | Status: DC | PRN

## 2016-01-20 MED ORDER — SENNOSIDES-DOCUSATE SODIUM 8.6-50 MG PO TABS
2.0000 | ORAL_TABLET | ORAL | Status: DC
  Administered 2016-01-21: 2 via ORAL
  Filled 2016-01-20 (×2): qty 2

## 2016-01-20 MED ORDER — MISOPROSTOL 25 MCG QUARTER TABLET
25.0000 ug | ORAL_TABLET | ORAL | Status: DC | PRN
  Filled 2016-01-20: qty 1

## 2016-01-20 MED ORDER — INSULIN NPH (HUMAN) (ISOPHANE) 100 UNIT/ML ~~LOC~~ SUSP
15.0000 [IU] | Freq: Every day | SUBCUTANEOUS | Status: DC
  Administered 2016-01-21: 15 [IU] via SUBCUTANEOUS
  Filled 2016-01-20: qty 10

## 2016-01-20 MED ORDER — LACTATED RINGERS IV SOLN: 500.0000 mL | INTRAVENOUS | Status: DC | PRN

## 2016-01-20 MED ORDER — TERBUTALINE SULFATE 1 MG/ML IJ SOLN
0.2500 mg | Freq: Once | INTRAMUSCULAR | Status: DC | PRN
  Filled 2016-01-20: qty 1

## 2016-01-20 MED ORDER — LIDOCAINE HCL (PF) 1 % IJ SOLN
30.0000 mL | INTRAMUSCULAR | Status: DC | PRN
Start: 2016-01-20 — End: 2016-01-20
  Filled 2016-01-20: qty 30

## 2016-01-20 MED ORDER — PRENATAL MULTIVITAMIN CH
1.0000 | ORAL_TABLET | Freq: Every day | ORAL | Status: DC
  Administered 2016-01-21: 1 via ORAL
  Filled 2016-01-20: qty 1

## 2016-01-20 MED ORDER — INSULIN ASPART 100 UNIT/ML ~~LOC~~ SOLN: 37.0000 [IU] | Freq: Three times a day (TID) | SUBCUTANEOUS | Status: DC

## 2016-01-20 MED ORDER — OXYTOCIN 10 UNIT/ML IJ SOLN
INTRAMUSCULAR | Status: AC
  Filled 2016-01-20: qty 1

## 2016-01-20 MED ORDER — ACETAMINOPHEN 325 MG PO TABS
650.0000 mg | ORAL_TABLET | ORAL | Status: DC | PRN
  Administered 2016-01-20 – 2016-01-22 (×3): 650 mg via ORAL
  Filled 2016-01-20 (×4): qty 2

## 2016-01-20 MED ORDER — COCONUT OIL OIL: 1.0000 "application " | TOPICAL_OIL | Status: DC | PRN

## 2016-01-20 MED ORDER — ONDANSETRON HCL 4 MG/2ML IJ SOLN: 4.0000 mg | Freq: Four times a day (QID) | INTRAMUSCULAR | Status: DC | PRN

## 2016-01-20 MED ORDER — DIPHENHYDRAMINE HCL 25 MG PO CAPS: 25.0000 mg | ORAL_CAPSULE | Freq: Four times a day (QID) | ORAL | Status: DC | PRN

## 2016-01-20 MED ORDER — WITCH HAZEL-GLYCERIN EX PADS: 1.0000 "application " | MEDICATED_PAD | CUTANEOUS | Status: DC | PRN

## 2016-01-20 MED ORDER — IBUPROFEN 600 MG PO TABS
600.0000 mg | ORAL_TABLET | Freq: Four times a day (QID) | ORAL | Status: DC
  Administered 2016-01-20 – 2016-01-21 (×4): 600 mg via ORAL
  Filled 2016-01-20 (×4): qty 1

## 2016-01-20 MED ORDER — ONDANSETRON HCL 4 MG/2ML IJ SOLN: 4.0000 mg | INTRAMUSCULAR | Status: DC | PRN

## 2016-01-20 MED ORDER — INSULIN ASPART 100 UNIT/ML ~~LOC~~ SOLN
10.0000 [IU] | Freq: Once | SUBCUTANEOUS | Status: AC
  Administered 2016-01-20: 10 [IU] via SUBCUTANEOUS

## 2016-01-20 MED ORDER — INSULIN NPH (HUMAN) (ISOPHANE) 100 UNIT/ML ~~LOC~~ SUSP
20.0000 [IU] | Freq: Every day | SUBCUTANEOUS | Status: DC
  Administered 2016-01-21 – 2016-01-22 (×2): 20 [IU] via SUBCUTANEOUS
  Filled 2016-01-20: qty 10

## 2016-01-20 MED ORDER — INSULIN ASPART 100 UNIT/ML ~~LOC~~ SOLN
15.0000 [IU] | Freq: Three times a day (TID) | SUBCUTANEOUS | Status: DC
  Administered 2016-01-21 – 2016-01-22 (×4): 15 [IU] via SUBCUTANEOUS

## 2016-01-20 MED ORDER — ONDANSETRON HCL 4 MG PO TABS: 4.0000 mg | ORAL_TABLET | ORAL | Status: DC | PRN

## 2016-01-20 MED ORDER — OXYTOCIN 40 UNITS IN LACTATED RINGERS INFUSION - SIMPLE MED
1.0000 m[IU]/min | INTRAVENOUS | Status: DC
  Administered 2016-01-20: 2 m[IU]/min via INTRAVENOUS

## 2016-01-20 MED ORDER — OXYTOCIN 40 UNITS IN LACTATED RINGERS INFUSION - SIMPLE MED
2.5000 [IU]/h | INTRAVENOUS | Status: DC
  Filled 2016-01-20: qty 1000

## 2016-01-20 MED ORDER — ZOLPIDEM TARTRATE 5 MG PO TABS: 5.0000 mg | ORAL_TABLET | Freq: Every evening | ORAL | Status: DC | PRN

## 2016-01-20 MED ORDER — SIMETHICONE 80 MG PO CHEW: 80.0000 mg | CHEWABLE_TABLET | ORAL | Status: DC | PRN

## 2016-01-20 MED ORDER — FENTANYL CITRATE (PF) 100 MCG/2ML IJ SOLN
100.0000 ug | INTRAMUSCULAR | Status: DC | PRN
  Administered 2016-01-20: 100 ug via INTRAVENOUS
  Filled 2016-01-20: qty 2

## 2016-01-20 MED ORDER — SOD CITRATE-CITRIC ACID 500-334 MG/5ML PO SOLN: 30.0000 mL | ORAL | Status: DC | PRN

## 2016-01-20 MED ORDER — OXYTOCIN 10 UNIT/ML IJ SOLN
10.0000 [IU] | Freq: Once | INTRAMUSCULAR | Status: AC
  Administered 2016-01-20: 10 [IU] via INTRAMUSCULAR

## 2016-01-20 MED ORDER — BUTALBITAL-APAP-CAFFEINE 50-325-40 MG PO TABS: 1.0000 | ORAL_TABLET | Freq: Four times a day (QID) | ORAL | Status: DC | PRN

## 2016-01-20 MED ORDER — LACTATED RINGERS IV SOLN
INTRAVENOUS | Status: DC
  Administered 2016-01-20: 15:00:00 via INTRAVENOUS

## 2016-01-20 MED ORDER — ACETAMINOPHEN 325 MG PO TABS: 650.0000 mg | ORAL_TABLET | ORAL | Status: DC | PRN

## 2016-01-20 MED ORDER — BENZOCAINE-MENTHOL 20-0.5 % EX AERO
1.0000 "application " | INHALATION_SPRAY | CUTANEOUS | Status: DC | PRN
  Administered 2016-01-20: 1 via TOPICAL
  Filled 2016-01-20: qty 56

## 2016-01-20 MED ORDER — ALBUTEROL SULFATE (2.5 MG/3ML) 0.083% IN NEBU: 3.0000 mL | INHALATION_SOLUTION | RESPIRATORY_TRACT | Status: DC | PRN

## 2016-01-20 MED ORDER — PNEUMOCOCCAL VAC POLYVALENT 25 MCG/0.5ML IJ INJ
0.5000 mL | INJECTION | INTRAMUSCULAR | Status: DC
  Filled 2016-01-20: qty 0.5

## 2016-01-20 MED ORDER — OXYTOCIN BOLUS FROM INFUSION
500.0000 mL | INTRAVENOUS | Status: DC
  Administered 2016-01-20: 125 mL via INTRAVENOUS

## 2016-01-20 MED ORDER — INSULIN ASPART 100 UNIT/ML FLEXPEN: 37.0000 [IU] | PEN_INJECTOR | Freq: Three times a day (TID) | SUBCUTANEOUS | Status: DC

## 2016-01-20 MED ORDER — TETANUS-DIPHTH-ACELL PERTUSSIS 5-2.5-18.5 LF-MCG/0.5 IM SUSP: 0.5000 mL | Freq: Once | INTRAMUSCULAR | Status: DC

## 2016-01-20 NOTE — MAU Note (Signed)
Pt sent up from clinic for elevated b/p and non reassuring NST.

## 2016-01-20 NOTE — H&P (Signed)
Monique Schmidt is a 29 y.o. female [phone] @ 38.3wks presenting for IOL for pre-eclampsia without severe features who was found incidentally in routine prenatal appointment to have non-reactive NST. Referred to MAU for direct admission since strong concern for persistent fetal distress.    Maternal Medical History:  Reason for admission: Nausea. Non-reactive NST, pre-clampsia without severe features  Fetal activity: Perceived fetal activity is normal.   Last perceived fetal movement was within the past hour.    Prenatal complications: PIH, pre-eclampsia and thrombocytopenia.     OB History    Gravida Para Term Preterm AB TAB SAB Ectopic Multiple Living   Past Medical History  Diagnosis Date  . Abnormal Pap smear   . Gestational diabetes     all pregnancies glyburide started 11/4  . Thrombocytopenia (HCC)   . Headache(784.0)   . Vaginal Pap smear, abnormal   . Hernia, umbilical    Past Surgical History  Procedure Laterality Date  . Wisdom tooth extraction    . Umbilical hernia repair N/A 11/22/2014    Procedure: LAPAROSCOPIC UMBILICAL HERNIA;  Surgeon: Axel Filler, MD;  Location: [facility] OR;  Service: General;  Laterality: N/A;   Family History: family history includes Diabetes in her father and mother; Hypertension in her father; Seizures in her brother. Social History:  reports that she has never smoked. She has never used smokeless tobacco. She reports that she does not drink alcohol or use illicit drugs.   Prenatal Transfer Tool  Maternal Diabetes: Yes:  Diabetes Type:  Insulin/Medication controlled Genetic Screening: Normal Maternal Ultrasounds/Referrals: Normal Fetal Ultrasounds or other Referrals:  Referred to Materal Fetal Medicine  Maternal Substance Abuse:  No Significant Maternal Medications:  None Significant Maternal Lab Results:  Lab values include: Group B Strep negative Other Comments:  None  Review of Systems  Constitutional:  Negative.   Eyes: Negative.  Negative for blurred vision and double vision.  Respiratory: Negative.   Cardiovascular: Negative for chest pain and palpitations.  Gastrointestinal: Negative for nausea, vomiting and abdominal pain.  Genitourinary: Negative.   Musculoskeletal: Negative.   Skin: Negative.   Neurological: Positive for headaches. Negative for dizziness.  Endo/Heme/Allergies: Negative.   Psychiatric/Behavioral: Negative.       Blood pressure 122/84, pulse 97, temperature 98.1 F (36.7 C), temperature source Oral, resp. rate 183, height  (1.676 m), weight 325 lb (147.419 kg), unknown if currently breastfeeding. Maternal Exam:  Abdomen: Patient reports no abdominal tenderness. Fetal presentation: vertex  Introitus: Normal vulva. Normal vagina.    Physical Exam  Constitutional: She is oriented to person, place, and time. She appears well-developed and well-nourished.  HENT:  Head: Normocephalic and atraumatic.  Eyes: Conjunctivae are normal. Pupils are equal, round, and reactive to light.  Neck: Normal range of motion. Neck supple.  Cardiovascular: Normal rate and regular rhythm.   Respiratory: Effort normal and breath sounds normal.  GI:  Gravida  Musculoskeletal: Normal range of motion.  Neurological: She is alert and oriented to person, place, and time.  Skin: Skin is warm and dry.    Prenatal labs: ABO, Rh: --/--/A POS (06/02 1214) Antibody: NEG (05/05 1630) Rubella: 11.50 (12/12 1032) RPR: Non Reactive (05/05 1630)  HBsAg: NEGATIVE (12/12 1032)  HIV: NONREACTIVE (04/24 1432)  GBS: Negative (05/15 0000)   Assessment/Plan: #Pre-e without severe features #GDM A2 B #Morbid obesity #Grand multipara #Asthma  -IOL for pre-e with pitocin -Monitor FSBS q4hr -  Cont home dose insulin -Cont DM diet -Recheck q1-2 hrs  Olena LeatherwoodKelly M Aguilar 01/20/2016, 2:38 PM   CNM attestation:  I have seen and examined this patient; I agree with above documentation in the  resident's note.   Marisa SprinklesLatasha N Mccrone is a 30 y.o. [phone] here for IOL due to pre-e without severe features and non reassuring NST.  PE: BP 136/82 mmHg  Pulse 102  Temp(Src) 98.1 F (36.7 C) (Oral)  Resp 23  Ht 5\' 6"  (1.676 m)  Wt 147.419 kg (325 lb)  BMI 52.48 kg/m2  SpO2 100%  LMP  (LMP Unknown)  Breastfeeding? Unknown Gen: calm comfortable, NAD Resp: normal effort, no distress Abd: gravid  ROS, labs, PMH reviewed  Urine P/C ratio: 0.31  CBC    Component Value Date/Time   WBC 7.7 01/20/2016 1214   RBC 4.80 01/20/2016 1214   HGB 12.3 01/20/2016 1214   HGB 12.8 12/17/2011   HCT 36.8 01/20/2016 1214   HCT 39 12/17/2011   PLT 101* 01/20/2016 1214   PLT 164 12/17/2011   MCV 76.7* 01/20/2016 1214   MCH 25.6* 01/20/2016 1214   MCHC 33.4 01/20/2016 1214   RDW 16.8* 01/20/2016 1214   LYMPHSABS 2.1 08/01/2015 1032   MONOABS 0.5 08/01/2015 1032   EOSABS 0.1 08/01/2015 1032   BASOSABS 0.0 08/01/2015 1032   Creatine 0.6, nl LFTs  [facility]: [facility] Will begin with Pitocin as pt is a multip w/ a favorable cx Glucommander as needed Watch BP  SHAW, KIMBERLY CNM 01/20/2016, 10:55 PM

## 2016-01-20 NOTE — MAU Provider Note (Signed)
MAU HISTORY AND PHYSICAL  Chief Complaint:  Hypertension   Monique Schmidt is a 30 y.o.  [phone] with IUP at 9359w3d presenting for Hypertension Prenatal risk factors: multipara 5+, non-compliant GDM A2, h/o MFM for transverse position, morbid obesity, thrombocytopenia, narcotic use (unclear if dependent), gHTN (onset after 20 wga) Pt was seen at her regular scheduled prenatal appointment and found to have non-reactive NST with elevated BP sent to MAU for evaluation of pre-eclampsia without severe features.  In MAU NST remained reactive with no contractions but PLT count remained low at 101 and microspot ratio 0.31 with BP in clinic 147/93 and prior visit on 01/13/16 showed BP 141/78. Patient states she has been having  none contractions,  none vaginal bleeding, intact membranes, with active fetal movement.  Does report intermittent episodes of HA and spots in vision with BLE swelling for the past week, NONE today.  ROS: Denies any LOF, VB, urinary symptoms, painful coitus, RUQ pain, dizziness, CP, SOB, HA, changes in vision.   Past Medical History  Diagnosis Date  . Abnormal Pap smear   . Gestational diabetes     all pregnancies glyburide started 11/4  . Thrombocytopenia (HCC)   . Headache(784.0)   . Vaginal Pap smear, abnormal   . Hernia, umbilical     Past Surgical History  Procedure Laterality Date  . Wisdom tooth extraction    . Umbilical hernia repair N/A 11/22/2014    Procedure: LAPAROSCOPIC UMBILICAL HERNIA;  Surgeon: Axel FillerArmando Ramirez, MD;  Location: [facility] OR;  Service: General;  Laterality: N/A;    Family History  Problem Relation Age of Onset  . Hypertension Father   . Diabetes Father   . Diabetes Mother   . Seizures Brother     Social History  Substance Use Topics  . Smoking status: Never Smoker   . Smokeless tobacco: Never Used  . Alcohol Use: No    Allergies  Allergen Reactions  . Latex Itching and Rash  . Penicillins Itching and Rash    Has patient had a PCN  reaction causing immediate rash, facial/tongue/throat swelling, SOB or lightheadedness with hypotension: Yes Has patient had a PCN reaction causing severe rash involving mucus membranes or skin necrosis: Yes Has patient had a PCN reaction that required hospitalization No Has patient had a PCN reaction occurring within the last 10 years: Yes If all of the above answers are "NO", then may proceed with Cephalosporin use.     Prescriptions prior to admission  Medication Sig Dispense Refill Last Dose  . aspirin 81 MG chewable tablet Chew 1 tablet (81 mg total) by mouth daily. 90 tablet 3 01/20/2016 at Unknown time  . insulin aspart (NOVOLOG) 100 UNIT/ML injection Inject 37 Units into the skin 3 (three) times daily with meals. Please dispense FLEX Pens 10 mL 11 Past Week at Unknown time  . insulin NPH Human (HUMULIN N,NOVOLIN N) 100 UNIT/ML injection Inject 40 units in the morning and 35 units at night Please dispense FLEX PENS 10 mL 3 Past Week at Unknown time  . Prenatal Multivit-Min-Fe-FA (PRENATAL VITAMINS) 0.8 MG tablet Take 1 tablet by mouth daily. 30 tablet 12 01/20/2016 at Unknown time  . butalbital-acetaminophen-caffeine (FIORICET) 50-325-40 MG tablet Take 1 tablet by mouth every 6 (six) hours as needed for headache. (Patient not taking: Reported on 01/20/2016) 20 tablet 0 Not Taking at Unknown time  . cyclobenzaprine (FLEXERIL) 10 MG tablet Take 1 tablet (10 mg total) by mouth every 8 (eight) hours as needed for muscle  spasms. (Patient not taking: Reported on 01/20/2016) 30 tablet 1 Not Taking at Unknown time  . metFORMIN (GLUCOPHAGE XR) 500 MG 24 hr tablet Take 4 tablets (2,000 mg total) by mouth daily with breakfast. (Patient taking differently: Take 1,000 mg by mouth 2 (two) times daily. ) 120 tablet 3 Taking  . zolpidem (AMBIEN) 5 MG tablet Take 1 tablet (5 mg total) by mouth at bedtime as needed for sleep. (Patient not taking: Reported on 01/20/2016) 30 tablet 1 Not Taking at Unknown time     Review of Systems - Negative except for what is mentioned in HPI.  Physical Exam  Blood pressure 120/81, pulse 100, temperature 98.5 F (36.9 C), temperature source Oral, resp. rate 18, unknown if currently breastfeeding. GENERAL: Well-developed, well-nourished female in no acute distress.  LUNGS: Clear to auscultation bilaterally.  HEART: Regular rate and rhythm. ABDOMEN: Soft, nontender, nondistended, gravid.  EXTREMITIES: Nontender, no edema, 2+ distal pulses. Cervical Exam: Deferred Presentation: transverse FHT:  Baseline 150, min variability, +acels, no decels-reactive Contractions: None   Labs: Results for orders placed or performed during the hospital encounter of 01/20/16 (from the past 24 hour(s))  Urinalysis, Routine w reflex microscopic (not at [facility])   Collection Time: 01/20/16 11:49 AM  Result Value Ref Range   Color, Urine YELLOW YELLOW   APPearance CLOUDY (A) CLEAR   Specific Gravity, Urine 1.025 1.005 - 1.030   pH 6.0 5.0 - 8.0   Glucose, UA NEGATIVE NEGATIVE mg/dL   Hgb urine dipstick NEGATIVE NEGATIVE   Bilirubin Urine NEGATIVE NEGATIVE   Ketones, ur 40 (A) NEGATIVE mg/dL   Protein, ur 30 (A) NEGATIVE mg/dL   Nitrite NEGATIVE NEGATIVE   Leukocytes, UA NEGATIVE NEGATIVE  Protein / creatinine ratio, urine   Collection Time: 01/20/16 11:49 AM  Result Value Ref Range   Creatinine, Urine 241.00 mg/dL   Total Protein, Urine 74 mg/dL   Protein Creatinine Ratio 0.31 (H) 0.00 - 0.15 mg/mg[Cre]  Urine microscopic-add on   Collection Time: 01/20/16 11:49 AM  Result Value Ref Range   Squamous Epithelial / LPF TOO NUMEROUS TO COUNT (A) NONE SEEN   WBC, UA 0-5 0 - 5 WBC/hpf   RBC / HPF NONE SEEN 0 - 5 RBC/hpf   Bacteria, UA FEW (A) NONE SEEN   Crystals CA OXALATE CRYSTALS (A) NEGATIVE  CBC   Collection Time: 01/20/16 12:14 PM  Result Value Ref Range   WBC 7.7 4.0 - 10.5 K/uL   RBC 4.80 3.87 - 5.11 MIL/uL   Hemoglobin 12.3 12.0 - 15.0 g/dL   HCT 16.1 09.6 -  04.5 %   MCV 76.7 (L) 78.0 - 100.0 fL   MCH 25.6 (L) 26.0 - 34.0 pg   MCHC 33.4 30.0 - 36.0 g/dL   RDW 40.9 (H) 81.1 - 91.4 %   Platelets PENDING 150 - 400 K/uL  Comprehensive metabolic panel   Collection Time: 01/20/16 12:14 PM  Result Value Ref Range   Sodium 135 135 - 145 mmol/L   Potassium 3.2 (L) 3.5 - 5.1 mmol/L   Chloride 103 101 - 111 mmol/L   CO2 21 (L) 22 - 32 mmol/L   Glucose, Bld 187 (H) 65 - 99 mg/dL   BUN 7 6 - 20 mg/dL   Creatinine, Ser 7.82 0.44 - 1.00 mg/dL   Calcium 8.6 (L) 8.9 - 10.3 mg/dL   Total Protein 6.8 6.5 - 8.1 g/dL   Albumin 2.7 (L) 3.5 - 5.0 g/dL   AST 33 15 -  41 U/L   ALT 38 14 - 54 U/L   Alkaline Phosphatase 174 (H) 38 - 126 U/L   Total Bilirubin 0.5 0.3 - 1.2 mg/dL   GFR calc non Af Amer >60 >60 mL/min   GFR calc Af Amer >60 >60 mL/min   Anion gap 11 5 - 15  ABO/Rh   Collection Time: 01/20/16 12:14 PM  Result Value Ref Range   ABO/RH(D) A POS     Imaging Studies:  [place] Mfm Fetal Bpp W/nonstress  12/25/2015  OBSTETRICAL ULTRASOUND: This exam was performed within a [place] Ultrasound Department. The OB US report was generated in the AS system, and faxed to the ordering physician.  This report is available in the [employer]. See the AS Obstetric US report via the Image Link.  [place] Mfm Fetal Bpp W/nonstress  12/25/2015  OBSTETRICAL ULTRASOUND: This exam was performed within a [place] Ultrasound Department. The OB US report was generated in the AS system, and faxed to the ordering physician.  This report is available in the [employer]. See the AS Obstetric US report via the Image Link.  [place] Mfm Fetal Bpp Wo Non Stress  01/13/2016  OBSTETRICAL ULTRASOUND: This exam was performed within a [place] Ultrasound Department. The OB US report was generated in the AS system, and faxed to the ordering physician.  This report is available in the [employer]. See the AS Obstetric US report via the Image Link.  [place] Mfm Fetal Bpp Wo Non Stress  01/09/2016   OBSTETRICAL ULTRASOUND: This exam was performed within a [place] Ultrasound Department. The OB US report was generated in the AS system, and faxed to the ordering physician.  This report is available in the [employer]. See the AS Obstetric US report via the Image Link.  [place] Mfm Fetal Bpp Wo Non Stress  01/05/2016  OBSTETRICAL ULTRASOUND: This exam was performed within a [place] Ultrasound Department. The OB US report was generated in the AS system, and faxed to the ordering physician.  This report is available in the [employer]. See the AS Obstetric US report via the Image Link.  [place] Mfm Fetal Bpp Wo Non Stress  12/23/2015  OBSTETRICAL ULTRASOUND: This exam was performed within a [place] Ultrasound Department. The OB US report was generated in the AS system, and faxed to the ordering physician.  This report is available in the [employer]. See the AS Obstetric US report via the Image Link.  [place] Mfm Ob Follow Up  01/13/2016  OBSTETRICAL ULTRASOUND: This exam was performed within a [place] Ultrasound Department. The OB US report was generated in the AS system, and faxed to the ordering physician.  This report is available in the [employer]. See the AS Obstetric US report via the Image Link.   Assessment: Monique Schmidt is  30 y.o. [id number] at 104w3d presents with Hypertension .  Plan: #Pre-eclampsia without severe features -Admit for continued fetal monitoring and IOL -Order BPP and need to determine EGW  Olena Leatherwood 6/2/201712:59 PM   I have seen and examined this patient and I agree with the above. See H&P. [name] 10:58 PM 01/20/2016

## 2016-01-20 NOTE — Progress Notes (Signed)
Subjective:  Monique Schmidt is a [age] y.o. [phone] at 4143w3d being seen today for ongoing prenatal care.  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  GDM: Patient taking metformin, NPH, and aspart.  Reports no hypoglycemic episodes.  Tolerating medication well Did not bring log book.  Reports several elevated blood sugars at random times - 140-160 after dinner.  Patient reports neck pain.  Contractions: Irregular. Vag. Bleeding: None.  Movement: Present. Denies leaking of fluid.   The following portions of the patient's history were reviewed and updated as appropriate: allergies, current medications, past family history, past medical history, past social history, past surgical history and problem list. Problem list updated.  Objective:   Filed Vitals:   01/20/16 1054 01/20/16 1058  BP: 113/73 147/92  Pulse: 109   Weight: 325 lb 12.8 oz (147.782 kg)     Fetal Status: Fetal Heart Rate (bpm): NST   Movement: Present     General:  Alert, oriented and cooperative. Patient is in no acute distress.  Skin: Skin is warm and dry. No rash noted.   Cardiovascular: Normal heart rate noted  Respiratory: Normal respiratory effort, no problems with respiration noted  Abdomen: Soft, gravid, appropriate for gestational age. Pain/Pressure: Present     Pelvic: Vag. Bleeding: None     Cervical exam deferred        Extremities: Normal range of motion.  Edema: Trace  Mental Status: Normal mood and affect. Normal behavior. Normal judgment and thought  content.   Urinalysis:      Assessment and Plan:  Pregnancy: [id number] at 3643w3d  1. Diabetes mellitus in pregnancy, antepartum, third trimester 5/26 - efw 8#1oz. Proven pelvis to 8#13oz. NST not reactive.  Questionable decels.  Will send to MAU for prolonged monitoring and BPP.  Discussed with patient not to eat or drink. - Amniotic fluid index with NST  2. Supervision of high-risk pregnancy, third trimester  3. Thrombocytopenia (HCC) Last recheck was 114.  Check CBC  4.  Elevated BP I do not see that this was ever addressed.  Will check preeclampsia labs and send pt to MAU  Term labor symptoms and general obstetric precautions including but not limited to vaginal bleeding, contractions, leaking of fluid and fetal movement were reviewed in detail with the patient. Please refer to After Visit Summary for other counseling recommendations.  No Follow-up on file.   Levie HeritageJacob J Suvi Archuletta, DO

## 2016-01-20 NOTE — Progress Notes (Signed)
Pt states she has a pain in the back of her neck.  IOL scheduled 6/6.  Pt taken to MAU for prolonged EFM due to NR NST.

## 2016-01-20 NOTE — Progress Notes (Signed)
OB Note Late entry for 1530 Chart reviewed. Pt IOL for pre-x w/o severe features @ 38/3. Preg c/b GDMa2 (PO and insulin), BMI 49, gestational thrombocytopenia (known). Patient diagnosed with pre-x w/o severe features.  No s/s of pre-x Category II due to occasional subtle ?late decels but +accels, normal baseline, mod variability toco q5-5030m SVE by RN 3cm A/P: pt stable D/w pt that given multip status that recommend proceeding with IOL given hopefully fairly quick course to get her into active labor.  Normal baseline, mod var and accels indicate fetus is doing well and will keep an eye on for any increased frequency of them as labor progresses. Recommend pitocin given cx and multip status and easily titrate if FITL.  Also would recommend epidural early if pt decides she wants one. Pt told that likely will need plt recheck and 100k threshold likely to be used. Pt amenable to plan.   Most recent BS in the low 120s. Will recheck one hour and if <120 will do q4h will BOW intact and/or <4cm and then switch to q1-2h  GBS neg and fetus with evidence of DM fetopathy with EFW >90% and AC >97% on 5/26. Pt with h/o 3890gm VD with no issues. Will keep close eye out on labor curve and for SD during 2nd stage.  Cornelia Copaharlie Kimi Bordeau, Jr MD Attending Center for [facility] Mid[facility](Faculty Practice)

## 2016-01-21 LAB — GLUCOSE, CAPILLARY
GLUCOSE-CAPILLARY: 145 mg/dL — AB (ref 65–99)
Glucose-Capillary: 182 mg/dL — ABNORMAL HIGH (ref 65–99)
Glucose-Capillary: 166 mg/dL — ABNORMAL HIGH (ref 65–99)
Glucose-Capillary: 173 mg/dL — ABNORMAL HIGH (ref 65–99)

## 2016-01-21 LAB — CBC
HCT: 32.6 % — ABNORMAL LOW (ref 36.0–46.0)
Hemoglobin: 10.9 g/dL — ABNORMAL LOW (ref 12.0–15.0)
MCH: 25.5 pg — ABNORMAL LOW (ref 26.0–34.0)
MCHC: 33.4 g/dL (ref 30.0–36.0)
MCV: 76.2 fL — AB (ref 78.0–100.0)
PLATELETS: 92 10*3/uL — AB (ref 150–400)
RBC: 4.28 MIL/uL (ref 3.87–5.11)
RDW: 16.9 % — AB (ref 11.5–15.5)
WBC: 7.9 10*3/uL (ref 4.0–10.5)

## 2016-01-21 LAB — RPR: RPR: NONREACTIVE

## 2016-01-21 MED ORDER — METFORMIN HCL ER 500 MG PO TB24
1000.0000 mg | ORAL_TABLET | Freq: Two times a day (BID) | ORAL | Status: DC
  Administered 2016-01-21 – 2016-01-22 (×3): 1000 mg via ORAL
  Filled 2016-01-21 (×3): qty 2

## 2016-01-21 MED ORDER — ENALAPRIL MALEATE 10 MG PO TABS
10.0000 mg | ORAL_TABLET | Freq: Every day | ORAL | Status: DC
  Administered 2016-01-21 – 2016-01-22 (×2): 10 mg via ORAL
  Filled 2016-01-21 (×2): qty 1

## 2016-01-21 NOTE — Clinical Social Work Maternal (Signed)
CLINICAL SOCIAL WORK MATERNAL/CHILD NOTE  Patient Details  Name: Monique Schmidt MRN: [id number] Date of Birth: 01/20/2018  Date: 01/21/2016  Clinical Social Worker Initiating Note: Trula SladeHeather Schmidt, MSW, LCSWDate/ Time Initiated: 01/21/16/1436   Child's Name: Monique Schmidt   Legal Guardian: Mother   Need for Interpreter: None   Date of Referral: 01/20/16   Reason for Referral: Current Substance Use/Substance Use During Pregnancy    Referral Source: Physician   Address: [address] [place], [place] [address]  Phone number: [phone]   Household Members: Self, Minor Children   Natural Supports (not living in the home): Spouse/significant other, Higher education careers adviserChurch   Professional Supports:None   Employment:Student   Type of Work: Patient reports that she goes to school onling.    Education: Attending college   Financial Resources:Self-Pay    Other Resources: Sales executiveood Stamps , Public Housing    Cultural/Religious Considerations Which May Impact Care: none identified by pt.   Strengths: Ability to meet basic needs , Compliance with medical plan , Home prepared for child , Pediatrician chosen    Risk Factors/Current Problems: Other (Comment) (pt is poor historian--reports no hx of substance use despite chart indicating opioid and THC use until Jan 2017. pt continued to deny prior substance use)   Cognitive State: Able to Concentrate , Alert    Mood/Affect: Calm , Comfortable , Relaxed    CSW Assessment:Patient is 30 year old [place], [place] with her 4 children (ages 486,4,3 and 2yo). Patient recently gave birth to Monique Monique #5. Patient reports that she is single but maintains a good relationship with the father of her children (who entered the room after assessment was completed). Patient reports that the father of her children helps her with caretaking duties. 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 Patient reports that her family is minimally supportive but her pastor is a "big support for me." Patient reports no hx of depression or anxiety and no post partum issues with her previous children. Patient reports no hx of substance use or abuse; however, chart indicates opioid and THC use until 08/2015. Per chart, pt stated that she stopped using these drugs after finding out she was pregnant. Drug screen completed on newborn-negative for all substances. Results are pending on umbilical cord screen. Patient continued to deny hx or present use of any drugs or alcohol. Patient was provided with resources including: "Perinatal Mood & Anxiety Disorders" handout and "Feelings After Birth" pamphlet. She was encouraged to attend this support group if she was interested. Patient voiced no concerns about caring for her newborn or discharge barriers. Patient reports that the father of her children will be taking her and the Monique home. She presented with pleasant mood and calm affect throughout assessment and interacted well with her two older children (ages 836 and 3) that were also present during assessment. Newborn Monique was sleeping in basinet during assessment. Patient verbalized understanding of information presented to her. CSW waiting for results of umbilical cord screening as of 01/21/2016 2:48 PM   CSW Plan/Description: Information/Referral to [employer] , [employer]/Family Education , Psychosocial Support and Ongoing Assessment of Needs   Schmidt, Elkins ParkHeather, LCSW 01/21/2016, 2:40 PM

## 2016-01-21 NOTE — Progress Notes (Signed)
Post Partum Day 1 Subjective:  Monique SprinklesLatasha N Schmidt is a 30 y.o. [id number] 2835w3d s/p SVD. Pregnancy was complicated by preeclampsia, A2/B DM, and thrombocytopenia. No acute events overnight. Pt reports persistent dizziness both when lying and walking. She denies problems with ambulating, voiding, po intake, nausea, or vomiting.  Pain is well controlled.  She has had flatus.  Lochia Minimal.  Plan for birth control is possible bilateral tubal ligation but patient has an 11cm abdominal mesh.  Method of Feeding: breast.  Objective: Blood pressure 133/93, pulse 98, temperature 98.6 F (37 C), temperature source Oral, resp. rate 20, height 5\' 6"  (1.676 m), weight 147.419 kg (325 lb), SpO2 100 %, unknown if currently breastfeeding.  Physical Exam:  General: alert, cooperative and no distress Chest: normal WOB Heart: Regular rate Abdomen: +BS, soft, mild TTP (appropriate) DVT Evaluation: no evidence of DVT seen on physical exam. Extremities: no edema   Recent Labs  01/20/16 1214  HGB 12.3  HCT 36.8    Assessment/Plan:  ASSESSMENT: Monique Schmidt is a 30 y.o. [id number] 4835w3d s/p SVD. Pregnancy was complicated by preeclampsia, A2/B DM, and thrombocytopenia.  Check CBC Half home insulin regimen (Novo 15 TID, NPH 20 AM, 15 HS) and metformin 1000 BID Continue routine PP care Breastfeeding support PRN  CNM attestation Post Partum Day #1    Monique Schmidt is a 30 y.o. [id number] s/p SVD.  Pt denies problems with ambulating, voiding or po intake. Pain is well controlled.  Plan for birth control is bilateral tubal ligation.  Method of Feeding: breast  PE: BPs: 126/77, 136/82 BP 133/93 mmHg  Pulse 98  Temp(Src) 98.4 F (36.9 C) (Oral)  Resp 20  Ht 5\' 6"  (1.676 m)  Wt 147.419 kg (325 lb)  BMI 52.48 kg/m2  SpO2 100%  LMP  (LMP Unknown)  Breastfeeding? Unknown Fundus firm  CBGs: 182, 244 last PM (given Novalog 10u)  CBC    Component Value Date/Time   WBC 7.9 01/21/2016 0842   RBC  4.28 01/21/2016 0842   HGB 10.9* 01/21/2016 0842   HGB 12.8 12/17/2011   HCT 32.6* 01/21/2016 0842   HCT 39 12/17/2011   PLT 92* 01/21/2016 0842   PLT 164 12/17/2011   MCV 76.2* 01/21/2016 0842   MCH 25.5* 01/21/2016 0842   MCHC 33.4 01/21/2016 0842   RDW 16.9* 01/21/2016 0842   LYMPHSABS 2.1 08/01/2015 1032   MONOABS 0.5 08/01/2015 1032   EOSABS 0.1 08/01/2015 1032   BASOSABS 0.0 08/01/2015 1032     Plan for discharge: 01/22/16 Continue to monitor BPs- stable for now Will begin halved home insulin today Thrombocytopenia stable @ 92  SHAW, KIMBERLY, CNM 9:11 AM  01/21/2016

## 2016-01-21 NOTE — Lactation Note (Signed)
This note was copied from a baby's chart. Lactation Consultation Note  Experience BF mother denies any concerns with BF. She has very large pendulous breasts but is able to position [place] and latch her.  Supplementing [place] with formula as did with her other children.  Explained that she should always BF first and then offer formula if she felt it necessary. Also explained that she could hand express and spoon feed.  A manual pump was given to mother with instructions for use and cleaning. Encouraged her to feed any expressed BM back to the baby. Information given on support groups and outpatient services.  Patient Name: Monique Si RaiderLatasha Veilleux Today's Date: 01/21/2016 Reason for consult: Initial assessment   Maternal Data Has patient been taught Hand Expression?: Yes Does the patient have breastfeeding experience prior to this delivery?: Yes  Feeding    LATCH Score/Interventions Latch: Repeated attempts needed to sustain latch, nipple held in mouth throughout feeding, stimulation needed to elicit sucking reflex.  Audible Swallowing: A few with stimulation  Type of Nipple: Everted at rest and after stimulation  Comfort (Breast/Nipple): Soft / non-tender     Hold (Positioning): No assistance needed to correctly position infant at breast.  LATCH Score: 8  Lactation Tools Discussed/Used     Consult Status Consult Status: Follow-up Date: 01/22/16 Follow-up type: In-patient    Soyla DryerJoseph, Henley Boettner 01/21/2016, 3:02 PM

## 2016-01-22 DIAGNOSIS — O1404 Mild to moderate pre-eclampsia, complicating childbirth: Secondary | ICD-10-CM

## 2016-01-22 HISTORY — DX: Mild to moderate pre-eclampsia, complicating childbirth: O14.04

## 2016-01-22 MED ORDER — ENALAPRIL MALEATE 10 MG PO TABS: 10.0000 mg | ORAL_TABLET | Freq: Every day | ORAL | Status: DC

## 2016-01-22 MED ORDER — ACETAMINOPHEN 325 MG PO TABS: 650.0000 mg | ORAL_TABLET | ORAL | Status: DC | PRN

## 2016-01-22 MED ORDER — INSULIN NPH (HUMAN) (ISOPHANE) 100 UNIT/ML ~~LOC~~ SUSP: SUBCUTANEOUS | Status: DC

## 2016-01-22 MED ORDER — INSULIN ASPART 100 UNIT/ML ~~LOC~~ SOLN: 15.0000 [IU] | Freq: Three times a day (TID) | SUBCUTANEOUS | Status: DC

## 2016-01-22 MED ORDER — METFORMIN HCL ER (OSM) 500 MG PO TB24: 1000.0000 mg | ORAL_TABLET | Freq: Every day | ORAL | Status: DC

## 2016-01-22 MED ORDER — OXYCODONE-ACETAMINOPHEN 5-325 MG PO TABS
1.0000 | ORAL_TABLET | ORAL | Status: DC | PRN
  Administered 2016-01-22: 1 via ORAL
  Filled 2016-01-22: qty 1

## 2016-01-22 NOTE — Lactation Note (Signed)
This note was copied from a baby's chart. Lactation Consultation Note  Mother reports that she is planning to formula feed at this time because the cramping is too painful and she cannot take ibuprofen,  Encouraged her to urinate prior to feeding so that her uterus stays in position and that it may help her cramping.  Also reminded her that this pain only lasts a few days.  She will call lactation if she needs any help. Patient Name: Monique Schmidt ZOXWR'UToday's Date: 01/22/2016     Maternal Data    Feeding    LATCH Score/Interventions                      Lactation Tools Discussed/Used     Consult Status      Soyla DryerJoseph, Nickey Canedo 01/22/2016, 8:36 AM

## 2016-01-22 NOTE — Discharge Instructions (Signed)

## 2016-01-22 NOTE — Discharge Summary (Signed)
OB Discharge Summary     Patient Name: Monique SprinklesLatasha N Harbuck DOB: 01/10/1986 MRN: [id number]  Date of admission: 01/20/2016 Delivering MD: Olena LeatherwoodAGUILAR, KELLY M   Date of discharge: 01/22/2016  Admitting diagnosis: 38WKS, NONREACTIVE NST Intrauterine pregnancy: 7810w3d     Secondary diagnosis:  Active Problems:   Moderate persistent asthma   Diabetes mellitus in pregnancy, antepartum   Thrombocytopenia (HCC)   Pre-eclampsia, mild   Pre-eclampsia, mild, antepartum   SVD (spontaneous vaginal delivery)   Mild preeclampsia delivered  Additional problems: none     Discharge diagnosis: Term Pregnancy Delivered, Preeclampsia (mild), GDM A2 and Type 2 DM                                                                                                Post partum procedures:none  Augmentation: AROM, Pitocin, Cytotec and Foley Balloon  Complications: None  Hospital course:  Induction of Labor With Vaginal Delivery   30 y.o. yo [id number] at 9110w3d was admitted to the hospital 01/20/2016 for induction of labor.  Indication for induction: Preeclampsia, A2 DM and TYPE 2 DM.  Patient had an uncomplicated labor course as follows: Membrane Rupture Time/Date: 4:59 PM ,01/20/2016   Intrapartum Procedures: Episiotomy: None [1]                                         Lacerations:  None [1]  Patient had delivery of a Viable infant.  Information for the patient's newborn:  Wende CreaseMiller, Girl Carey [[id number]][[id number]]  Delivery Method: Vaginal, Spontaneous Delivery (Filed from Delivery Summary)   01/20/2016  Details of delivery can be found in separate delivery note.  Patient had a routine postpartum course. Patient is discharged home 01/22/2016.  A2/BGDM: discharged on insulins and metformin as below. Insulin doses half of her pregnancy doses  Pre-eclampsia: started on Enalapril for BP control and renal protection. Baby love nurse ordered. Stressed about reliable birth control method while on enalapril. Patient and partner didn't  reach consensus between BTL and vasectomy at the time of discharge.  Physical exam  Filed Vitals:   01/21/16 1753 01/21/16 1940 01/22/16 0553 01/22/16 0903  BP: 153/104 128/74 117/73 139/89  Pulse:  87 94   Temp:   97.8 F (36.6 C) 97 F (36.1 C)  TempSrc:   Oral   Resp:   20   Height:      Weight:      SpO2:   98%    General: alert, cooperative and no distress Lochia: appropriate Uterine Fundus: firm Incision: N/A DVT Evaluation: No evidence of DVT seen on physical exam. Labs: Lab Results  Component Value Date   WBC 7.9 01/21/2016   HGB 10.9* 01/21/2016   HCT 32.6* 01/21/2016   MCV 76.2* 01/21/2016   PLT 92* 01/21/2016   CMP Latest Ref Rng 01/20/2016  Glucose 65 - 99 mg/dL 130(Q187(H)  BUN 6 - 20 mg/dL 7  Creatinine 6.570.44 - 8.461.00 mg/dL 9.620.60  Sodium 952135 - 841145 mmol/L 135  Potassium 3.5 -  5.1 mmol/L 3.2(L)  Chloride 101 - 111 mmol/L 103  CO2 22 - 32 mmol/L 21(L)  Calcium 8.9 - 10.3 mg/dL 1.6(X)  Total Protein 6.5 - 8.1 g/dL 6.8  Total Bilirubin 0.3 - 1.2 mg/dL 0.5  Alkaline Phos 38 - 126 U/L 174(H)  AST 15 - 41 U/L 33  ALT 14 - 54 U/L 38    Discharge instruction: per After Visit Summary and "Baby and Me Booklet".  After visit meds:    Medication List    STOP taking these medications        cyclobenzaprine 10 MG tablet  Commonly known as:  FLEXERIL     NOVOLOG FLEXPEN 100 UNIT/ML FlexPen  Generic drug:  insulin aspart  Replaced by:  insulin aspart 100 UNIT/ML injection     zolpidem 5 MG tablet  Commonly known as:  AMBIEN      TAKE these medications        acetaminophen 325 MG tablet  Commonly known as:  TYLENOL  Take 2 tablets (650 mg total) by mouth every 4 (four) hours as needed (for pain scale < 4).     aspirin 81 MG chewable tablet  Chew 1 tablet (81 mg total) by mouth daily.     butalbital-acetaminophen-caffeine 50-325-40 MG tablet  Commonly known as:  FIORICET  Take 1 tablet by mouth every 6 (six) hours as needed for headache.     enalapril 10  MG tablet  Commonly known as:  VASOTEC  Take 1 tablet (10 mg total) by mouth daily.     insulin aspart 100 UNIT/ML injection  Commonly known as:  novoLOG  Inject 15 Units into the skin 3 (three) times daily with meals.     insulin NPH Human 100 UNIT/ML injection  Commonly known as:  HUMULIN N,NOVOLIN N  Inject 0.2 mLs (20 Units total) into the skin daily before breakfast and 0.15 mls (15 units) at bedtime.     metformin 500 MG (OSM) 24 hr tablet  Commonly known as:  GLUCOPHAGE XR  Take 2 tablets (1,000 mg total) by mouth daily with breakfast.     Prenatal Vitamins 0.8 MG tablet  Take 1 tablet by mouth daily.     PROVENTIL HFA 108 (90 Base) MCG/ACT inhaler  Generic drug:  albuterol  Inhale 2 puffs into the lungs every 4 (four) hours as needed.        Diet: carb modified diet  Activity: Advance as tolerated. Pelvic rest for 6 weeks.   Outpatient follow up:6 weeks  Postpartum contraception: Interval Tubal Ligation or vasectomy. Patient and partner didn't reach consensus at the time of discharge.  Newborn Data: Live born female  Birth Weight: 9 lb 11.7 oz (4414 g) APGAR: 9, 9  Baby Feeding: Breast Disposition:home with mother   01/22/2016 Almon Hercules, MD   OB fellow attestation I have seen and examined this patient and agree with above documentation in the resident's note.   ROSSLYN PASION is a 30 y.o. [id number] s/p NSVD.   Pain is well controlled.  Plan for birth control is bilateral tubal ligation but there was a length discussion about her plan for contraception.  Method of Feeding: bottle  PE:  BP 139/89 mmHg  Pulse 94  Temp(Src) 97 F (36.1 C) (Oral)  Resp 20  Ht 5\' 6"  (1.676 m)  Wt 325 lb (147.419 kg)  BMI 52.48 kg/m2  SpO2 98%  LMP  (LMP Unknown)  Breastfeeding? Unknown Gen: well appearing Heart: reg rate Lungs:  normal WOB Fundus firm Ext: soft, no pain, no edema   Recent Labs  01/20/16 1214 01/21/16 0842  HGB 12.3 10.9*  HCT 36.8 32.6*    Plan: discharge today - postpartum care discussed - f/u clinic in 6 weeks for postpartum visit - Preeclampsia: started enalapril and teratogenic SE were discussed with patient. She is likely a T2DM and would benefit from the nephroprotection.  - I messaged the clinic to make a 2 wk appt to discuss BP and contraception   Federico Flake, MD 10:08 AM

## 2016-01-23 LAB — GLUCOSE, CAPILLARY: GLUCOSE-CAPILLARY: 122 mg/dL — AB (ref 65–99)

## 2016-01-24 ENCOUNTER — Inpatient Hospital Stay (HOSPITAL_COMMUNITY): Admission: RE | Admit: 2016-01-24 | Payer: Medicaid Other | Source: Ambulatory Visit

## 2016-02-27 ENCOUNTER — Encounter: Payer: Self-pay | Admitting: Obstetrics and Gynecology

## 2016-02-27 ENCOUNTER — Ambulatory Visit (INDEPENDENT_AMBULATORY_CARE_PROVIDER_SITE_OTHER): Payer: Medicaid Other | Admitting: Obstetrics and Gynecology

## 2016-02-27 ENCOUNTER — Other Ambulatory Visit (HOSPITAL_COMMUNITY)
Admission: RE | Admit: 2016-02-27 | Discharge: 2016-02-27 | Disposition: A | Discharge status: Home / Self Care | Payer: Medicaid Other | Source: Ambulatory Visit | Attending: Obstetrics and Gynecology | Admitting: Obstetrics and Gynecology

## 2016-02-27 VITALS — BP 108/86 | HR 81 | Wt 302.5 lb

## 2016-02-27 DIAGNOSIS — N760 Acute vaginitis: Secondary | ICD-10-CM | POA: Diagnosis not present

## 2016-02-27 DIAGNOSIS — Z124 Encounter for screening for malignant neoplasm of cervix: Secondary | ICD-10-CM

## 2016-02-27 DIAGNOSIS — Z01419 Encounter for gynecological examination (general) (routine) without abnormal findings: Secondary | ICD-10-CM | POA: Insufficient documentation

## 2016-02-27 DIAGNOSIS — A499 Bacterial infection, unspecified: Secondary | ICD-10-CM | POA: Diagnosis not present

## 2016-02-27 DIAGNOSIS — Z1151 Encounter for screening for human papillomavirus (HPV): Secondary | ICD-10-CM | POA: Diagnosis present

## 2016-02-27 DIAGNOSIS — Z8742 Personal history of other diseases of the female genital tract: Secondary | ICD-10-CM | POA: Diagnosis not present

## 2016-02-27 DIAGNOSIS — B9689 Other specified bacterial agents as the cause of diseases classified elsewhere: Secondary | ICD-10-CM

## 2016-02-27 LAB — POCT URINALYSIS DIP (DEVICE)
Bilirubin Urine: NEGATIVE
GLUCOSE, UA: NEGATIVE mg/dL
KETONES UR: NEGATIVE mg/dL
LEUKOCYTES UA: NEGATIVE
Nitrite: NEGATIVE
Protein, ur: NEGATIVE mg/dL
SPECIFIC GRAVITY, URINE: 1.025 (ref 1.005–1.030)
Urobilinogen, UA: 0.2 mg/dL (ref 0.0–1.0)
pH: 6.5 (ref 5.0–8.0)

## 2016-02-27 MED ORDER — METRONIDAZOLE 500 MG PO TABS: 500.0000 mg | ORAL_TABLET | Freq: Two times a day (BID) | ORAL | Status: DC

## 2016-02-27 NOTE — Progress Notes (Signed)
Postpartum Note  Patient ID: Monique Schmidt, female   DOB: 08/11/1986, 30 y.o.   MRN: [id number] Subjective:     Monique SprinklesLatasha N Proch is a 30 y.o. [id number] here for PP visit. Preg c/b BMI 49, pre-eclampsia w/o severe features, GDMa2 (likely pre-exisiting DM2), gestational thrombocytopenia (90s-120s), MI asthma. No menses or bleeding yet. Formula and breastfeeding (q2-3x/day), +intercourse (no issues), adjusting well to the new infant. She is having some vaginal discharge that is somewhat malodorous, no itching or pain.   The following portions of the patient's history were reviewed and updated as appropriate: allergies, current medications, past family history, past medical history, past social history, past surgical history and problem list.  Review of Systems Pertinent items are noted in HPI.   Objective:    BP 108/86 mmHg  Pulse 81  Wt 302 lb 8 oz (137.213 kg)  NAD Breast: normal b/l on palpation and inspection GU: EGBS normal Vaginal vault: minimal old blood in vault, slightly malodorous white-yellow discharge, no CMT, cervix normal Assessment:     Normal postpartum exam, BV.  Plan:  *PP: follow up pap smear (negative 07/2013). Flagyl for BV. Patient declined GC/CT testing. Husband getting a vasectomy, with condoms in the interim *Likely DM2: d/w pt need for 2hr testing and that she likely has DM2. Patient not interested but somewhat amenable at the end of the visit. Request for lab only visit put in for front desk *Medical issues: d/w pt that she should stay on the vasotec. She doesn't have a PCP and I d/w her the need for one as she has many medical issues that put her at serious risk in the future. List of PCP providers given to patient  Cornelia Copaharlie Pickens, Jr MD Attending Center [facility](Faculty Practice)

## 2016-02-28 LAB — CYTOLOGY - PAP

## 2016-03-01 ENCOUNTER — Other Ambulatory Visit: Payer: Medicaid Other

## 2016-07-05 ENCOUNTER — Emergency Department (HOSPITAL_COMMUNITY)
Admission: EM | Admit: 2016-07-05 | Discharge: 2016-07-05 | Disposition: A | Discharge status: Home / Self Care | Payer: Medicaid Other | Attending: Emergency Medicine | Admitting: Emergency Medicine

## 2016-07-05 ENCOUNTER — Encounter (HOSPITAL_COMMUNITY): Payer: Self-pay | Admitting: Emergency Medicine

## 2016-07-05 DIAGNOSIS — Z7982 Long term (current) use of aspirin: Secondary | ICD-10-CM | POA: Insufficient documentation

## 2016-07-05 DIAGNOSIS — B9689 Other specified bacterial agents as the cause of diseases classified elsewhere: Secondary | ICD-10-CM

## 2016-07-05 DIAGNOSIS — Z91040 Latex allergy status: Secondary | ICD-10-CM | POA: Insufficient documentation

## 2016-07-05 DIAGNOSIS — Z794 Long term (current) use of insulin: Secondary | ICD-10-CM | POA: Diagnosis not present

## 2016-07-05 DIAGNOSIS — Z7984 Long term (current) use of oral hypoglycemic drugs: Secondary | ICD-10-CM | POA: Diagnosis not present

## 2016-07-05 DIAGNOSIS — R1030 Lower abdominal pain, unspecified: Secondary | ICD-10-CM | POA: Diagnosis present

## 2016-07-05 DIAGNOSIS — N760 Acute vaginitis: Secondary | ICD-10-CM | POA: Insufficient documentation

## 2016-07-05 DIAGNOSIS — R109 Unspecified abdominal pain: Secondary | ICD-10-CM

## 2016-07-05 LAB — COMPREHENSIVE METABOLIC PANEL
ALBUMIN: 3.6 g/dL (ref 3.5–5.0)
ALK PHOS: 72 U/L (ref 38–126)
ALT: 24 U/L (ref 14–54)
AST: 27 U/L (ref 15–41)
Anion gap: 7 (ref 5–15)
BILIRUBIN TOTAL: 0.8 mg/dL (ref 0.3–1.2)
BUN: 6 mg/dL (ref 6–20)
CO2: 27 mmol/L (ref 22–32)
Calcium: 9.1 mg/dL (ref 8.9–10.3)
Chloride: 105 mmol/L (ref 101–111)
Creatinine, Ser: 0.64 mg/dL (ref 0.44–1.00)
GFR calc Af Amer: >60 mL/min (ref >60)
GFR calc non Af Amer: >60 mL/min (ref >60)
GLUCOSE: 197 mg/dL — AB (ref 65–99)
POTASSIUM: 3.8 mmol/L (ref 3.5–5.1)
SODIUM: 139 mmol/L (ref 135–145)
TOTAL PROTEIN: 7.1 g/dL (ref 6.5–8.1)

## 2016-07-05 LAB — CBC
HEMATOCRIT: 40.4 % (ref 36.0–46.0)
Hemoglobin: 13.4 g/dL (ref 12.0–15.0)
MCH: 26.5 pg (ref 26.0–34.0)
MCHC: 33.2 g/dL (ref 30.0–36.0)
MCV: 79.8 fL (ref 78.0–100.0)
Platelets: 154 10*3/uL (ref 150–400)
RBC: 5.06 MIL/uL (ref 3.87–5.11)
RDW: 17.3 % — AB (ref 11.5–15.5)
WBC: 5.6 10*3/uL (ref 4.0–10.5)

## 2016-07-05 LAB — URINALYSIS, ROUTINE W REFLEX MICROSCOPIC
BILIRUBIN URINE: NEGATIVE
GLUCOSE, UA: NEGATIVE mg/dL
KETONES UR: NEGATIVE mg/dL
Leukocytes, UA: NEGATIVE
NITRITE: NEGATIVE
PH: 5.5 (ref 5.0–8.0)
Protein, ur: NEGATIVE mg/dL
SPECIFIC GRAVITY, URINE: 1.027 (ref 1.005–1.030)

## 2016-07-05 LAB — WET PREP, GENITAL
Sperm: NONE SEEN
Trich, Wet Prep: NONE SEEN
Yeast Wet Prep HPF POC: NONE SEEN

## 2016-07-05 LAB — URINE MICROSCOPIC-ADD ON

## 2016-07-05 LAB — I-STAT BETA HCG BLOOD, ED (MC, WL, AP ONLY)

## 2016-07-05 LAB — LIPASE, BLOOD: Lipase: 23 U/L (ref 11–51)

## 2016-07-05 MED ORDER — METRONIDAZOLE 500 MG PO TABS: 500.0000 mg | ORAL_TABLET | Freq: Two times a day (BID) | ORAL | 0 refills | Status: DC

## 2016-07-05 NOTE — ED Provider Notes (Signed)
[facility]-EMERGENCY DEPT Provider Note   CSN: [id number] Arrival date & time: 07/05/16  [id number]     History   Chief Complaint Chief Complaint  Patient presents with  . Abdominal Pain    HPI Monique Schmidt is a 30 y.o. female with no significant past medical history presents to the ED today complaining of abdominal cramping and lower back pain. Patient states that for the last 2 weeks she's had intermittent cramping sensation in her suprapubic region. She is not having associated low back pain that is sharp in nature. She states she experience worsening symptoms in the past when she either had a bladder infection or a "bacterial infection". Patient reports associated nausea and intermittent vomiting. Last episode of emesis was 3 days ago and was nonbloody and nonbilious. She is also had intermittent diarrhea but she states is common for her. She denies any dysuria, vaginal discharge. She is sexually active with one female partner. She is not concerned for STI at this time. She did note some vaginal spotting 2-3 days ago but no frank vaginal bleeding. She is not currently taking any contraceptives. She denies any fevers, chills.  HPI  Past Medical History:  Diagnosis Date  . Abnormal Pap smear   . Gestational diabetes    likely pre-exisiting; all pregnancies glyburide started 11/4  . Gestational thrombocytopenia (HCC)   . Headache(784.0)   . Hernia, umbilical   . History of abnormal cervical Pap smear    normal 2014  . Mild preeclampsia delivered 01/22/2016    Patient Active Problem List   Diagnosis Date Noted  . BV (bacterial vaginosis) 02/27/2016  . BMI 50.0-59.9, adult (HCC) 11/09/2013  . Moderate persistent asthma 08/07/2013    Past Surgical History:  Procedure Laterality Date  . UMBILICAL HERNIA REPAIR N/A 11/22/2014   Procedure: LAPAROSCOPIC UMBILICAL HERNIA;  Surgeon: Axel FillerArmando Ramirez, MD;  Location: [facility] OR;  Service: General;  Laterality: N/A;  . WISDOM TOOTH EXTRACTION       OB History    Gravida Para Term Preterm AB Living   6 5 5   1 5    SAB TAB Ectopic Multiple Live Births   1     0 5       Home Medications    Prior to Admission medications   Medication Sig Start Date End Date Taking? Authorizing Provider  Oxycodone HCl 10 MG TABS Take 10 mg by mouth 3 (three) times daily. 06/25/16  Yes Historical Provider, MD  acetaminophen (TYLENOL) 325 MG tablet Take 2 tablets (650 mg total) by mouth every 4 (four) hours as needed (for pain scale < 4). Patient not taking: Reported on 07/05/2016 01/22/16   Almon Herculesaye T Gonfa, MD  albuterol (PROVENTIL HFA) 108 (90 Base) MCG/ACT inhaler Inhale 2 puffs into the lungs every 4 (four) hours as needed for wheezing or shortness of breath. Reported on 02/27/2016 08/03/13   Historical Provider, MD  aspirin 81 MG chewable tablet Chew 1 tablet (81 mg total) by mouth daily. Patient not taking: Reported on 07/05/2016 12/29/15   Wilmer FloorLisa A Leftwich-Kirby, CNM  butalbital-acetaminophen-caffeine (FIORICET) 50-325-40 MG tablet Take 1 tablet by mouth every 6 (six) hours as needed for headache. Patient not taking: Reported on 07/05/2016 07/21/15 07/20/16  Duane LopeJennifer I Rasch, NP  enalapril (VASOTEC) 10 MG tablet Take 1 tablet (10 mg total) by mouth daily. Patient not taking: Reported on 07/05/2016 01/22/16   Almon Herculesaye T Gonfa, MD  insulin aspart (NOVOLOG) 100 UNIT/ML injection Inject 15 Units into the skin 3 (  three) times daily with meals. Patient not taking: Reported on 07/05/2016 01/22/16   Almon Herculesaye T Gonfa, MD  insulin NPH Human (HUMULIN N,NOVOLIN N) 100 UNIT/ML injection Inject 0.2 mLs (20 Units total) into the skin daily before breakfast and 0.15 mls (15 units) at bedtime. Patient not taking: Reported on 07/05/2016 01/22/16   Almon Herculesaye T Gonfa, MD  metformin (FORTAMET) 500 MG (OSM) 24 hr tablet Take 2 tablets (1,000 mg total) by mouth daily with breakfast. Patient not taking: Reported on 07/05/2016 01/22/16   Almon Herculesaye T Gonfa, MD  metroNIDAZOLE (FLAGYL) 500 MG tablet Take 1  tablet (500 mg total) by mouth 2 (two) times daily. Patient not taking: Reported on 07/05/2016 02/27/16   [place] Bingharlie Pickens, MD  Prenatal Multivit-Min-Fe-FA (PRENATAL VITAMINS) 0.8 MG tablet Take 1 tablet by mouth daily. Patient not taking: Reported on 07/05/2016 08/01/15   Reva Boresanya S Pratt, MD    Family History Family History  Problem Relation Age of Onset  . Hypertension Father   . Diabetes Father   . Diabetes Mother   . Seizures Brother     Social History Social History  Substance Use Topics  . Smoking status: Never Smoker  . Smokeless tobacco: Never Used  . Alcohol use No     Allergies   Latex and Penicillins   Review of Systems Review of Systems  All other systems reviewed and are negative.    Physical Exam Updated Vital Signs BP 128/82 (BP Location: Right Wrist)   Pulse 72   Temp 97.9 F (36.6 C) (Oral)   Resp 20   LMP 05/02/2016   SpO2 98%   Physical Exam  Constitutional: She is oriented to person, place, and time. She appears well-developed. No distress.  Obese  HENT:  Head: Normocephalic and atraumatic.  Mouth/Throat: No oropharyngeal exudate.  Eyes: Conjunctivae and EOM are normal. Pupils are equal, round, and reactive to light. Right eye exhibits no discharge. Left eye exhibits no discharge. No scleral icterus.  Cardiovascular: Normal rate, regular rhythm, normal heart sounds and intact distal pulses.  Exam reveals no gallop and no friction rub.   No murmur heard. Pulmonary/Chest: Effort normal and breath sounds normal. No respiratory distress. She has no wheezes. She has no rales. She exhibits no tenderness.  Abdominal: Soft. She exhibits no distension. There is tenderness ( very mild TTp of suprapubic region). There is no guarding.  Genitourinary: Pelvic exam was performed with patient supine. There is no tenderness or lesion on the right labia. There is no tenderness or lesion on the left labia. Cervix exhibits no motion tenderness and no friability.  Right adnexum displays no tenderness. Left adnexum displays no tenderness. No erythema or tenderness in the vagina. Vaginal discharge found.  Musculoskeletal: Normal range of motion. She exhibits no edema.  Neurological: She is alert and oriented to person, place, and time.  Skin: Skin is warm and dry. No rash noted. She is not diaphoretic. No erythema. No pallor.  Psychiatric: She has a normal mood and affect. Her behavior is normal.  Nursing note and vitals reviewed.    ED Treatments / Results  Labs (all labs ordered are listed, but only abnormal results are displayed) Labs Reviewed  COMPREHENSIVE METABOLIC PANEL - Abnormal; Notable for the following:       Result Value   Glucose, Bld 197 (*)    All other components within normal limits  CBC - Abnormal; Notable for the following:    RDW 17.3 (*)    All other components within  normal limits  URINALYSIS, ROUTINE W REFLEX MICROSCOPIC (NOT AT [facility]) - Abnormal; Notable for the following:    Hgb urine dipstick SMALL (*)    All other components within normal limits  URINE MICROSCOPIC-ADD ON - Abnormal; Notable for the following:    Squamous Epithelial / LPF 0-5 (*)    Bacteria, UA RARE (*)    All other components within normal limits  WET PREP, GENITAL  LIPASE, BLOOD  RPR  HIV ANTIBODY (ROUTINE TESTING)  I-STAT BETA HCG BLOOD, ED ([facility], [facility], [facility] ONLY)  GC/CHLAMYDIA PROBE AMP ([place]) NOT AT [facility]    EKG  EKG Interpretation None       Radiology No results found.  Procedures Procedures (including critical care time)  Medications Ordered in ED Medications - No data to display   Initial Impression / Assessment and Plan / ED Course  I have reviewed the triage vital signs and the nursing notes.  Pertinent labs & imaging results that were available during my care of the patient were reviewed by me and considered in my medical decision making (see chart for details).  Clinical Course     30 y.o F presents to the ED  today c/o intermittent lower abdominal cramping x 2 weeks. On presentation to ED, pt overall appears well. In NAD. Abd soft, no rigidity. Very mild TTP over suprapubic region. Pt primarily concerned of pregnancy status. Hcg negative. Lab work unremarkable. UA does not show sign of infection. Pelvic exam performed, no CMT or adnexal TTP. Moderate discharge in vaginal vault. Pt declined prophylactic STD treatment at this time. GC/chlamudia cultures sent. Wet prep reveals clue cells. Will treat BV with flagyl. Instructed pt not to drink while on this medication. Follow up with OBGYN or PCP for further evaluation. Return precautions outlined in patient discharge instructions.    Final Clinical Impressions(s) / ED Diagnoses   Final diagnoses:  Abdominal cramping  Bacterial vaginosis    New Prescriptions Discharge Medication List as of 07/05/2016  1:40 PM    START taking these medications   Details  !! metroNIDAZOLE (FLAGYL) 500 MG tablet Take 1 tablet (500 mg total) by mouth 2 (two) times daily., Starting Thu 07/05/2016, Print     !! - Potential duplicate medications found. Please discuss with provider.       Lester Kinsman [place], PA-C 07/09/16 1308    Nira Conn, MD 07/09/16 [phone]

## 2016-07-05 NOTE — ED Triage Notes (Signed)
Pt states she missed her period and has been having lower abd cramping for 2 weeks. Pt states she has been having nausea/vomiting and headaches as well. Pt concerned she is pregnant.

## 2016-07-05 NOTE — Discharge Instructions (Signed)
You have bacterial vaginosis. Take antibiotics as prescribed. Do not drink or taking this medication. Follow up with her OB/GYN or your primary care doctor for further evaluation. Take ibuprofen as needed for pain. Return to the ED if you expands worsening of her symptoms, fever, vaginal bleeding.

## 2016-07-06 LAB — SYPHILIS: RPR W/REFLEX TO RPR TITER AND TREPONEMAL ANTIBODIES, TRADITIONAL SCREENING AND DIAGNOSIS ALGORITHM: RPR Ser Ql: NONREACTIVE

## 2016-07-06 LAB — GC/CHLAMYDIA PROBE AMP (~~LOC~~) NOT AT ARMC
Chlamydia: NEGATIVE
Neisseria Gonorrhea: NEGATIVE

## 2016-07-06 LAB — HIV ANTIBODY (ROUTINE TESTING W REFLEX): HIV Screen 4th Generation wRfx: NONREACTIVE

## 2016-11-03 ENCOUNTER — Emergency Department (HOSPITAL_COMMUNITY)
Admission: EM | Admit: 2016-11-03 | Discharge: 2016-11-04 | Disposition: A | Discharge status: Home / Self Care | Payer: Medicaid Other | Attending: Emergency Medicine | Admitting: Emergency Medicine

## 2016-11-03 ENCOUNTER — Encounter (HOSPITAL_COMMUNITY): Payer: Self-pay

## 2016-11-03 DIAGNOSIS — R110 Nausea: Secondary | ICD-10-CM | POA: Diagnosis not present

## 2016-11-03 DIAGNOSIS — R1030 Lower abdominal pain, unspecified: Secondary | ICD-10-CM | POA: Diagnosis present

## 2016-11-03 DIAGNOSIS — N3000 Acute cystitis without hematuria: Secondary | ICD-10-CM

## 2016-11-03 DIAGNOSIS — R102 Pelvic and perineal pain: Secondary | ICD-10-CM

## 2016-11-03 DIAGNOSIS — R1024 Suprapubic pain: Secondary | ICD-10-CM

## 2016-11-03 LAB — CBC
HCT: 40.1 % (ref 36.0–46.0)
HEMOGLOBIN: 13.5 g/dL (ref 12.0–15.0)
MCH: 27.4 pg (ref 26.0–34.0)
MCHC: 33.7 g/dL (ref 30.0–36.0)
MCV: 81.5 fL (ref 78.0–100.0)
Platelets: 184 10*3/uL (ref 150–400)
RBC: 4.92 MIL/uL (ref 3.87–5.11)
RDW: 15.7 % — ABNORMAL HIGH (ref 11.5–15.5)
WBC: 8.5 10*3/uL (ref 4.0–10.5)

## 2016-11-03 LAB — URINALYSIS, ROUTINE W REFLEX MICROSCOPIC
Bacteria, UA: NONE SEEN
Bilirubin Urine: NEGATIVE
GLUCOSE, UA: NEGATIVE mg/dL
HGB URINE DIPSTICK: NEGATIVE
Ketones, ur: NEGATIVE mg/dL
NITRITE: NEGATIVE
PH: 5 (ref 5.0–8.0)
PROTEIN: NEGATIVE mg/dL
Specific Gravity, Urine: 1.03 (ref 1.005–1.030)

## 2016-11-03 LAB — COMPREHENSIVE METABOLIC PANEL
ALBUMIN: 3.6 g/dL (ref 3.5–5.0)
ALT: 19 U/L (ref 14–54)
ANION GAP: 9 (ref 5–15)
AST: 21 U/L (ref 15–41)
Alkaline Phosphatase: 68 U/L (ref 38–126)
BUN: 7 mg/dL (ref 6–20)
CO2: 26 mmol/L (ref 22–32)
Calcium: 8.7 mg/dL — ABNORMAL LOW (ref 8.9–10.3)
Chloride: 97 mmol/L — ABNORMAL LOW (ref 101–111)
Creatinine, Ser: 0.59 mg/dL (ref 0.44–1.00)
GFR calc Af Amer: >60 mL/min (ref >60)
GFR calc non Af Amer: >60 mL/min (ref >60)
GLUCOSE: 183 mg/dL — AB (ref 65–99)
POTASSIUM: 3.4 mmol/L — AB (ref 3.5–5.1)
SODIUM: 132 mmol/L — AB (ref 135–145)
Total Bilirubin: 0.6 mg/dL (ref 0.3–1.2)
Total Protein: 7.3 g/dL (ref 6.5–8.1)

## 2016-11-03 LAB — LIPASE, BLOOD: Lipase: 15 U/L (ref 11–51)

## 2016-11-03 LAB — I-STAT BETA HCG BLOOD, ED (MC, WL, AP ONLY): I-stat hCG, quantitative: <5 m[IU]/mL (ref <5)

## 2016-11-03 MED ORDER — ACETAMINOPHEN 500 MG PO TABS
1000.0000 mg | ORAL_TABLET | Freq: Once | ORAL | Status: AC
Start: 2016-11-03 — End: 2016-11-03
  Administered 2016-11-03: 1000 mg via ORAL
  Filled 2016-11-03: qty 2

## 2016-11-03 MED ORDER — SULFAMETHOXAZOLE-TRIMETHOPRIM 800-160 MG PO TABS: 1.0000 | ORAL_TABLET | Freq: Two times a day (BID) | ORAL | 0 refills | Status: AC

## 2016-11-03 MED ORDER — ONDANSETRON 4 MG PO TBDP: 4.0000 mg | ORAL_TABLET | Freq: Three times a day (TID) | ORAL | 0 refills | Status: DC | PRN

## 2016-11-03 MED ORDER — ONDANSETRON 4 MG PO TBDP
8.0000 mg | ORAL_TABLET | Freq: Once | ORAL | Status: AC
  Administered 2016-11-03: 8 mg via ORAL
  Filled 2016-11-03: qty 2

## 2016-11-03 NOTE — ED Provider Notes (Signed)
[facility]-EMERGENCY DEPT Provider Note   CSN: [id number] Arrival date & time: 11/03/16  2209     History   Chief Complaint Chief Complaint  Patient presents with  . Abdominal Pain    HPI Monique Schmidt is a 31 y.o. female with a PMHx of gestational diabetes, headaches, umbilical hernia s/p repair, asthma, and obesity, who presents to the ED with complaints of suprapubic abdominal pain that began 3 days ago. She describes the pain is 8/10 intermittent sharp suprapubic pain radiating into her back diffusely, worse with laying on her left side, and with no treatments tried prior to arrival. Associated symptoms include malodorous urine, increased urinary frequency, and mild nausea. LMP reported by pt as being 2 months ago, although triage nurse reporting that it was 10/06/16. Patient states that she is usually regular with her menstrual cycle, she took a UPT at home which was negative. She is sexually active with one female partner, unprotected. She denies any recent travel, sick contacts, suspicious food intake, alcohol use, or NSAID use. She also denies fevers, chills, CP, SOB, V/D/C, obstipation, melena, hematochezia, vaginal bleeding/discharge, hematuria, dysuria, myalgias, arthralgias, numbness, tingling, focal weakness, or any other complaints at this time.    The history is provided by the patient and medical records. No language interpreter was used.  Abdominal Pain   This is a new problem. The current episode started more than 2 days ago. The problem occurs daily. The problem has not changed since onset.The pain is associated with an unknown factor. The pain is located in the suprapubic region. The quality of the pain is sharp. The pain is at a severity of 8/10. The pain is moderate. Associated symptoms include nausea and frequency. Pertinent negatives include fever, diarrhea, flatus, hematochezia, melena, vomiting, constipation, dysuria, hematuria, arthralgias and myalgias. The symptoms are  aggravated by certain positions. Nothing relieves the symptoms.    Past Medical History:  Diagnosis Date  . Abnormal Pap smear   . Gestational diabetes    likely pre-exisiting; all pregnancies glyburide started 11/4  . Gestational thrombocytopenia (HCC)   . Headache(784.0)   . Hernia, umbilical   . History of abnormal cervical Pap smear    normal 2014  . Mild preeclampsia delivered 01/22/2016    Patient Active Problem List   Diagnosis Date Noted  . BV (bacterial vaginosis) 02/27/2016  . BMI 50.0-59.9, adult (HCC) 11/09/2013  . Moderate persistent asthma 08/07/2013    Past Surgical History:  Procedure Laterality Date  . UMBILICAL HERNIA REPAIR N/A 11/22/2014   Procedure: LAPAROSCOPIC UMBILICAL HERNIA;  Surgeon: Axel FillerArmando Ramirez, MD;  Location: [facility] OR;  Service: General;  Laterality: N/A;  . WISDOM TOOTH EXTRACTION      OB History    Gravida Para Term Preterm AB Living   6 5 5   1 5    SAB TAB Ectopic Multiple Live Births   1     0 5       Home Medications    Prior to Admission medications   Medication Sig Start Date End Date Taking? Authorizing Provider  albuterol (PROVENTIL HFA) 108 (90 Base) MCG/ACT inhaler Inhale 2 puffs into the lungs every 4 (four) hours as needed for wheezing or shortness of breath. Reported on 02/27/2016 08/03/13  Yes Historical Provider, MD    Family History Family History  Problem Relation Age of Onset  . Hypertension Father   . Diabetes Father   . Diabetes Mother   . Seizures Brother     Social History  Social History  Substance Use Topics  . Smoking status: Never Smoker  . Smokeless tobacco: Never Used  . Alcohol use No     Allergies   Latex and Penicillins   Review of Systems Review of Systems  Constitutional: Negative for chills and fever.  Respiratory: Negative for shortness of breath.   Cardiovascular: Negative for chest pain.  Gastrointestinal: Positive for abdominal pain and nausea. Negative for blood in stool,  constipation, diarrhea, flatus, hematochezia, melena and vomiting.  Genitourinary: Positive for frequency. Negative for dysuria, hematuria, vaginal bleeding and vaginal discharge.       +malodorous urine  Musculoskeletal: Negative for arthralgias and myalgias.  Skin: Negative for color change.  Allergic/Immunologic: Negative for immunocompromised state.  Neurological: Negative for weakness and numbness.  Psychiatric/Behavioral: Negative for confusion.   10 Systems reviewed and are negative for acute change except as noted in the HPI.   Physical Exam Updated Vital Signs BP (!) 119/93   Pulse 98   Temp 97.9 F (36.6 C) (Oral)   Resp 16   LMP 10/06/2016 (Approximate)   SpO2 99%   Physical Exam  Constitutional: She is oriented to person, place, and time. Vital signs are normal. She appears well-developed and well-nourished.  Non-toxic appearance. No distress.  Afebrile, nontoxic, NAD, watching TV, laughing with friends at bedside  HENT:  Head: Normocephalic and atraumatic.  Mouth/Throat: Oropharynx is clear and moist and mucous membranes are normal.  Eyes: Conjunctivae and EOM are normal. Right eye exhibits no discharge. Left eye exhibits no discharge.  Neck: Normal range of motion. Neck supple.  Cardiovascular: Normal rate, regular rhythm, normal heart sounds and intact distal pulses.  Exam reveals no gallop and no friction rub.   No murmur heard. Pulmonary/Chest: Effort normal and breath sounds normal. No respiratory distress. She has no decreased breath sounds. She has no wheezes. She has no rhonchi. She has no rales.  Abdominal: Soft. Normal appearance and bowel sounds are normal. She exhibits no distension. There is tenderness in the suprapubic area. There is no rigidity, no rebound, no guarding, no CVA tenderness, no tenderness at McBurney's point and negative Murphy's sign.  Morbidly obese which slightly limits exam, however overall Soft, nondistended, +BS throughout, with mild  suprapubic TTP, no r/g/r, neg murphy's, neg mcburney's, no CVA TTP   Genitourinary:  Genitourinary Comments: Pt deferred  Musculoskeletal: Normal range of motion.  Neurological: She is alert and oriented to person, place, and time. She has normal strength. No sensory deficit.  Skin: Skin is warm, dry and intact. No rash noted.  Psychiatric: She has a normal mood and affect.  Nursing note and vitals reviewed.    ED Treatments / Results  Labs (all labs ordered are listed, but only abnormal results are displayed) Labs Reviewed  COMPREHENSIVE METABOLIC PANEL - Abnormal; Notable for the following:       Result Value   Sodium 132 (*)    Potassium 3.4 (*)    Chloride 97 (*)    Glucose, Bld 183 (*)    Calcium 8.7 (*)    All other components within normal limits  CBC - Abnormal; Notable for the following:    RDW 15.7 (*)    All other components within normal limits  URINALYSIS, ROUTINE W REFLEX MICROSCOPIC - Abnormal; Notable for the following:    APPearance HAZY (*)    Leukocytes, UA TRACE (*)    Squamous Epithelial / LPF 6-30 (*)    All other components within normal limits  URINE  CULTURE  LIPASE, BLOOD  I-STAT BETA HCG BLOOD, ED ([facility], [facility], [facility] ONLY)    EKG  EKG Interpretation None       Radiology No results found.  Procedures Procedures (including critical care time)  Medications Ordered in ED Medications  acetaminophen (TYLENOL) tablet 1,000 mg (1,000 mg Oral Given 11/03/16 2306)  ondansetron (ZOFRAN-ODT) disintegrating tablet 8 mg (8 mg Oral Given 11/03/16 2307)     Initial Impression / Assessment and Plan / ED Course  I have reviewed the triage vital signs and the nursing notes.  Pertinent labs & imaging results that were available during my care of the patient were reviewed by me and considered in my medical decision making (see chart for details).     30 y.o. female here with suprapubic abd pain, malodorous urine, urine freq, and nausea x3 days. On exam,  morbidly obese which mildly limits exam, but mild suprapubic TTP, nonperitoneal, no flank pain. States she had menses 2 months ago, but triage reported it was 10/06/16; pt states she took UPT at home which was neg. Awaiting labs and U/A, added on betaHCG; will hold off on imaging/pelvic/etc until we have her U/A and preg test back. Pt requesting tylenol and nausea meds, will give this now. Will reassess shortly  11:49 PM Pt feeling better after tylenol and zofran. U/A hazy with trace leuks, 0-5 WBC, no bacteria seen, so not completely consistent with UTI, however symptoms consistent with UTI; will send for culture. BetaHCG negative. CBC WNL. CMP with mildly elevated gluc 183, mildly low K 3.4, otherwise fairly unremarkable, no evidence of DKA/HHS. Lipase WNL. Discussed with pt that her symptoms match UTI so we could empirically treat, however pelvic exam could help in further evaluation today, but pt declined proceeding with pelvic exam and wants to empirically treat for UTI instead. I feel this is reasonable given lack of concerning exam findings and lack of vaginal complaints, doubt torsion or other pelvic emergency. Advised tylenol/motrin for pain, and f/up with PCP in 1wk. Rx for bactrim and zofran given. Strict return precautions given. I explained the diagnosis and have given explicit precautions to return to the ER including for any other new or worsening symptoms. The patient understands and accepts the medical plan as it's been dictated and I have answered their questions. Discharge instructions concerning home care and prescriptions have been given. The patient is STABLE and is discharged to home in good condition.   Final Clinical Impressions(s) / ED Diagnoses   Final diagnoses:  Suprapubic abdominal pain  Nausea  Acute cystitis without hematuria    New Prescriptions New Prescriptions   ONDANSETRON (ZOFRAN ODT) 4 MG DISINTEGRATING TABLET    Take 1 tablet (4 mg total) by mouth every 8 (eight)  hours as needed for nausea or vomiting.   SULFAMETHOXAZOLE-TRIMETHOPRIM (BACTRIM DS,SEPTRA DS) 800-160 MG TABLET    Take 1 tablet by mouth 2 (two) times daily.     [address] PA-C 11/03/16 2353    Blane Ohara, MD 11/04/16 [phone]

## 2016-11-03 NOTE — Discharge Instructions (Signed)
Stay very well hydrated with plenty of water throughout the day. Take antibiotic until completed. Alternate between tylenol and motrin as needed for pain. Use zofran as directed as needed for nausea. Follow up with your primary care physician in 1 week for recheck of ongoing symptoms. [facility] emergent changing or worsening of symptoms. Please seek immediate care if you develop the following: You develop back pain.  Your symptoms are no better, or worse in 3 days. There is severe back pain or lower abdominal pain.  You develop chills.  You have a fever.  There is nausea or vomiting.  There is continued burning or discomfort with urination.

## 2016-11-03 NOTE — ED Triage Notes (Signed)
Pt complaining of abdominal pain. Pt denies any N/V/D. Pt denies any vaginal bleeding/discharge. Pt states some smell to urine.

## 2016-11-05 LAB — URINE CULTURE

## 2016-12-25 ENCOUNTER — Encounter (HOSPITAL_COMMUNITY): Payer: Self-pay

## 2016-12-25 ENCOUNTER — Inpatient Hospital Stay (HOSPITAL_COMMUNITY)
Admission: AD | Admit: 2016-12-25 | Discharge: 2016-12-25 | Disposition: A | Discharge status: Home / Self Care | Payer: Medicaid Other | Source: Ambulatory Visit | Attending: Family Medicine | Admitting: Family Medicine

## 2016-12-25 DIAGNOSIS — R112 Nausea with vomiting, unspecified: Secondary | ICD-10-CM | POA: Insufficient documentation

## 2016-12-25 DIAGNOSIS — M545 Low back pain, unspecified: Secondary | ICD-10-CM

## 2016-12-25 DIAGNOSIS — Z880 Allergy status to penicillin: Secondary | ICD-10-CM | POA: Diagnosis not present

## 2016-12-25 DIAGNOSIS — D6959 Other secondary thrombocytopenia: Secondary | ICD-10-CM | POA: Diagnosis not present

## 2016-12-25 DIAGNOSIS — R1030 Lower abdominal pain, unspecified: Secondary | ICD-10-CM | POA: Diagnosis not present

## 2016-12-25 DIAGNOSIS — R109 Unspecified abdominal pain: Secondary | ICD-10-CM | POA: Diagnosis present

## 2016-12-25 DIAGNOSIS — N760 Acute vaginitis: Secondary | ICD-10-CM | POA: Diagnosis not present

## 2016-12-25 DIAGNOSIS — N3001 Acute cystitis with hematuria: Secondary | ICD-10-CM | POA: Insufficient documentation

## 2016-12-25 DIAGNOSIS — B9689 Other specified bacterial agents as the cause of diseases classified elsewhere: Secondary | ICD-10-CM | POA: Diagnosis not present

## 2016-12-25 DIAGNOSIS — K429 Umbilical hernia without obstruction or gangrene: Secondary | ICD-10-CM | POA: Insufficient documentation

## 2016-12-25 LAB — CBC WITH DIFFERENTIAL/PLATELET
BASOS ABS: 0 10*3/uL (ref 0.0–0.1)
BASOS PCT: 0 %
EOS ABS: 0.1 10*3/uL (ref 0.0–0.7)
Eosinophils Relative: 2 %
HCT: 39.5 % (ref 36.0–46.0)
HEMOGLOBIN: 13 g/dL (ref 12.0–15.0)
Lymphocytes Relative: 39 %
Lymphs Abs: 2.8 10*3/uL (ref 0.7–4.0)
MCH: 27.1 pg (ref 26.0–34.0)
MCHC: 32.9 g/dL (ref 30.0–36.0)
MCV: 82.5 fL (ref 78.0–100.0)
Monocytes Absolute: 0.3 10*3/uL (ref 0.1–1.0)
Monocytes Relative: 4 %
NEUTROS PCT: 55 %
Neutro Abs: 3.9 10*3/uL (ref 1.7–7.7)
Platelets: 173 10*3/uL (ref 150–400)
RBC: 4.79 MIL/uL (ref 3.87–5.11)
RDW: 15.9 % — ABNORMAL HIGH (ref 11.5–15.5)
WBC: 7.1 10*3/uL (ref 4.0–10.5)

## 2016-12-25 LAB — URINALYSIS, ROUTINE W REFLEX MICROSCOPIC
Glucose, UA: 100 mg/dL — AB
KETONES UR: NEGATIVE mg/dL
Nitrite: POSITIVE — AB
PH: 5.5 (ref 5.0–8.0)
PROTEIN: 100 mg/dL — AB
Specific Gravity, Urine: >1.03 — ABNORMAL HIGH (ref 1.005–1.030)

## 2016-12-25 LAB — HCG, SERUM, QUALITATIVE: PREG SERUM: NEGATIVE

## 2016-12-25 LAB — URINALYSIS, MICROSCOPIC (REFLEX)

## 2016-12-25 LAB — WET PREP, GENITAL
SPERM: NONE SEEN
TRICH WET PREP: NONE SEEN
YEAST WET PREP: NONE SEEN

## 2016-12-25 LAB — COMPREHENSIVE METABOLIC PANEL
ALBUMIN: 3.7 g/dL (ref 3.5–5.0)
ALK PHOS: 63 U/L (ref 38–126)
ALT: 28 U/L (ref 14–54)
ANION GAP: 5 (ref 5–15)
AST: 24 U/L (ref 15–41)
BUN: 11 mg/dL (ref 6–20)
CO2: 29 mmol/L (ref 22–32)
CREATININE: 0.81 mg/dL (ref 0.44–1.00)
Calcium: 9 mg/dL (ref 8.9–10.3)
Chloride: 105 mmol/L (ref 101–111)
GFR calc Af Amer: >60 mL/min (ref >60)
GFR calc non Af Amer: >60 mL/min (ref >60)
Glucose, Bld: 155 mg/dL — ABNORMAL HIGH (ref 65–99)
Potassium: 3.5 mmol/L (ref 3.5–5.1)
SODIUM: 139 mmol/L (ref 135–145)
TOTAL PROTEIN: 7.5 g/dL (ref 6.5–8.1)
Total Bilirubin: 0.3 mg/dL (ref 0.3–1.2)

## 2016-12-25 LAB — POCT PREGNANCY, URINE: Preg Test, Ur: NEGATIVE

## 2016-12-25 MED ORDER — PROMETHAZINE HCL 25 MG PO TABS: 25.0000 mg | ORAL_TABLET | Freq: Four times a day (QID) | ORAL | 2 refills | Status: DC | PRN

## 2016-12-25 MED ORDER — METRONIDAZOLE 500 MG PO TABS: 500.0000 mg | ORAL_TABLET | Freq: Two times a day (BID) | ORAL | 0 refills | Status: AC

## 2016-12-25 MED ORDER — ACETAMINOPHEN 325 MG PO TABS
650.0000 mg | ORAL_TABLET | Freq: Once | ORAL | Status: AC
  Administered 2016-12-25: 650 mg via ORAL
  Filled 2016-12-25: qty 2

## 2016-12-25 MED ORDER — SULFAMETHOXAZOLE-TRIMETHOPRIM 800-160 MG PO TABS: 1.0000 | ORAL_TABLET | Freq: Two times a day (BID) | ORAL | 0 refills | Status: DC

## 2016-12-25 NOTE — MAU Provider Note (Signed)
Chief Complaint:  Abdominal Pain and Back Pain   First Provider Initiated Contact with Patient 12/25/16 2059       HPI: Monique Schmidt is a 31 y.o. [id number] who presents to maternity admissions reporting lower abdominal and low back pain since this morning.  Started having light vaginal bleeding today.  LMP was in March but does not think this is her period.  Thinks she is pregnant and this is pregnancy bleeding. Marland Kitchen Has had some nausea and vomiting. She reports vaginal bleeding, but no vaginal itching/burning, urinary symptoms, h/a, dizziness, or fever/chills.    Abdominal Pain  This is a new problem. The current episode started today. The onset quality is gradual. The problem occurs constantly. The problem has been unchanged. The pain is located in the suprapubic region. The pain is mild. The quality of the pain is cramping. The abdominal pain radiates to the back. Pertinent negatives include no constipation, diarrhea, dysuria, fever, headaches, hematuria, myalgias, nausea or vomiting. Nothing aggravates the pain. The pain is relieved by nothing. She has tried nothing for the symptoms.  Back Pain  This is a new problem. The current episode started today. The problem occurs constantly. The problem is unchanged. The pain is present in the lumbar spine. The pain does not radiate. The pain is mild. The pain is the same all the time. Stiffness is present all day. Associated symptoms include abdominal pain and pelvic pain. Pertinent negatives include no dysuria, fever, headaches, leg pain, numbness, paresis or weakness. She has tried nothing for the symptoms.    RN note: Pt states she has lower back and abdominal pain that started today. States she started having vag bleeding today also. States this is not her period. LMP was back in March. Did not take anything for pain today.    Electronically signed by Brand Males, RN at 12/25/2016 8:17 PM      Past Medical History: Past Medical History:   Diagnosis Date  . Abnormal Pap smear   . Gestational diabetes    likely pre-exisiting; all pregnancies glyburide started 11/4  . Gestational thrombocytopenia (HCC)   . Headache(784.0)   . Hernia, umbilical   . History of abnormal cervical Pap smear    normal 2014  . Mild preeclampsia delivered 01/22/2016    Past obstetric history: OB History  Gravida Para Term Preterm AB Living  6 5 5   1 5   SAB TAB Ectopic Multiple Live Births  1     0 5    # Outcome Date GA Lbr Len/2nd Weight Sex Delivery Anes PTL Lv  6 Term 01/20/16 [redacted]w[redacted]d 00:55 / 00:08 9 lb 11.7 oz (4.414 kg) F Vag-Spont None  LIV     Birth Comments: none  5 Term 12/15/13 [redacted]w[redacted]d 06:43 / 00:02 8 lb 13.8 oz (4.02 kg) F Vag-Spont None  LIV  4 Term 06/26/12 [redacted]w[redacted]d 21:07 / 00:01 7 lb 2 oz (3.232 kg) F Vag-Spont None  LIV  3 Term 03/08/11 [redacted]w[redacted]d 01:27 / 00:02 8 lb 9 oz (3.884 kg) F Vag-Spont None  LIV     Birth Comments: none  2 Term 05/19/09 [redacted]w[redacted]d   F Vag-Spont None N LIV  1 SAB               Past Surgical History: Past Surgical History:  Procedure Laterality Date  . UMBILICAL HERNIA REPAIR N/A 11/22/2014   Procedure: LAPAROSCOPIC UMBILICAL HERNIA;  Surgeon: Axel Filler, MD;  Location: [facility] OR;  Service: General;  Laterality: N/A;  . WISDOM TOOTH EXTRACTION      Family History: Family History  Problem Relation Age of Onset  . Hypertension Father   . Diabetes Father   . Diabetes Mother   . Seizures Brother     Social History: Social History  Substance Use Topics  . Smoking status: Never Smoker  . Smokeless tobacco: Never Used  . Alcohol use No    Allergies:  Allergies  Allergen Reactions  . Latex Itching and Rash  . Penicillins Itching and Rash    Has patient had a PCN reaction causing immediate rash, facial/tongue/throat swelling, SOB or lightheadedness with hypotension: Yes Has patient had a PCN reaction causing severe rash involving mucus membranes or skin necrosis: Yes Has patient had a PCN reaction that  required hospitalization No Has patient had a PCN reaction occurring within the last 10 years: Yes If all of the above answers are "NO", then may proceed with Cephalosporin use.     Meds:  Prescriptions Prior to Admission  Medication Sig Dispense Refill Last Dose  . albuterol (PROVENTIL HFA) 108 (90 Base) MCG/ACT inhaler Inhale 2 puffs into the lungs every 4 (four) hours as needed for wheezing or shortness of breath. Reported on 02/27/2016   unk  . ondansetron (ZOFRAN ODT) 4 MG disintegrating tablet Take 1 tablet (4 mg total) by mouth every 8 (eight) hours as needed for nausea or vomiting. 15 tablet 0     I have reviewed patient's Past Medical Hx, Surgical Hx, Family Hx, Social Hx, medications and allergies.  ROS:  Review of Systems  Constitutional: Negative for fever.  Gastrointestinal: Positive for abdominal pain. Negative for constipation, diarrhea, nausea and vomiting.  Genitourinary: Positive for pelvic pain. Negative for dysuria and hematuria.  Musculoskeletal: Positive for back pain. Negative for myalgias.  Neurological: Negative for weakness, numbness and headaches.   Other systems negative     Physical Exam  Patient Vitals for the past 24 hrs:  BP Temp Temp src Pulse Resp SpO2 Height Weight  12/25/16 2018 129/80 98.3 F (36.8 C) Oral 72 18 98 % 5\' 4"  (1.626 m) (!) 316 lb (143.3 kg)   Constitutional: Well-developed, well-nourished female in no acute distress.  Cardiovascular: normal rate and rhythm, no ectopy audible, S1 & S2 heard, no murmur Respiratory: normal effort, no distress. Lungs CTAB with no wheezes or crackles GI: Abd soft, non-tender.  Nondistended.  No rebound, No guarding.  Bowel Sounds audible  MS: Extremities nontender, no edema, normal ROM Neurologic: Alert and oriented x 4.   Grossly nonfocal. GU: Neg CVAT. Skin:  Warm and Dry Psych:  Affect appropriate.  PELVIC EXAM: Cervix pink, visually closed, without lesion, small amount of bloody discharge,  vaginal walls and external genitalia normal Bimanual exam: Cervix firm, anterior, neg CMT, uterus nontender, nonenlarged, adnexa without tenderness, enlargement, or mass    Labs: Results for orders placed or performed during the hospital encounter of 12/25/16 (from the past 24 hour(s))  Urinalysis, Routine w reflex microscopic     Status: Abnormal   Collection Time: 12/25/16  8:19 PM  Result Value Ref Range   Color, Urine AMBER (A) YELLOW   APPearance CLOUDY (A) CLEAR   Specific Gravity, Urine >1.030 (H) 1.005 - 1.030   pH 5.5 5.0 - 8.0   Glucose, UA 100 (A) NEGATIVE mg/dL   Hgb urine dipstick LARGE (A) NEGATIVE   Bilirubin Urine SMALL (A) NEGATIVE   Ketones, ur NEGATIVE NEGATIVE mg/dL   Protein, ur 161 (A)  NEGATIVE mg/dL   Nitrite POSITIVE (A) NEGATIVE   Leukocytes, UA TRACE (A) NEGATIVE  Urinalysis, Microscopic (reflex)     Status: Abnormal   Collection Time: 12/25/16  8:19 PM  Result Value Ref Range   RBC / HPF TOO NUMEROUS TO COUNT 0 - 5 RBC/hpf   WBC, UA 0-5 0 - 5 WBC/hpf   Bacteria, UA RARE (A) NONE SEEN   Squamous Epithelial / LPF 0-5 (A) NONE SEEN  Pregnancy, urine POC     Status: None   Collection Time: 12/25/16  8:47 PM  Result Value Ref Range   Preg Test, Ur NEGATIVE NEGATIVE  Wet prep, genital     Status: Abnormal   Collection Time: 12/25/16  9:15 PM  Result Value Ref Range   Yeast Wet Prep HPF POC NONE SEEN NONE SEEN   Trich, Wet Prep NONE SEEN NONE SEEN   Clue Cells Wet Prep HPF POC PRESENT (A) NONE SEEN   WBC, Wet Prep HPF POC MODERATE (A) NONE SEEN   Sperm NONE SEEN   CBC with Differential/Platelet     Status: Abnormal   Collection Time: 12/25/16  9:21 PM  Result Value Ref Range   WBC 7.1 4.0 - 10.5 K/uL   RBC 4.79 3.87 - 5.11 MIL/uL   Hemoglobin 13.0 12.0 - 15.0 g/dL   HCT 16.1 09.6 - 04.5 %   MCV 82.5 78.0 - 100.0 fL   MCH 27.1 26.0 - 34.0 pg   MCHC 32.9 30.0 - 36.0 g/dL   RDW 40.9 (H) 81.1 - 91.4 %   Platelets 173 150 - 400 K/uL   Neutrophils  Relative % 55 %   Neutro Abs 3.9 1.7 - 7.7 K/uL   Lymphocytes Relative 39 %   Lymphs Abs 2.8 0.7 - 4.0 K/uL   Monocytes Relative 4 %   Monocytes Absolute 0.3 0.1 - 1.0 K/uL   Eosinophils Relative 2 %   Eosinophils Absolute 0.1 0.0 - 0.7 K/uL   Basophils Relative 0 %   Basophils Absolute 0.0 0.0 - 0.1 K/uL  Comprehensive metabolic panel     Status: Abnormal   Collection Time: 12/25/16  9:21 PM  Result Value Ref Range   Sodium 139 135 - 145 mmol/L   Potassium 3.5 3.5 - 5.1 mmol/L   Chloride 105 101 - 111 mmol/L   CO2 29 22 - 32 mmol/L   Glucose, Bld 155 (H) 65 - 99 mg/dL   BUN 11 6 - 20 mg/dL   Creatinine, Ser 7.82 0.44 - 1.00 mg/dL   Calcium 9.0 8.9 - 95.6 mg/dL   Total Protein 7.5 6.5 - 8.1 g/dL   Albumin 3.7 3.5 - 5.0 g/dL   AST 24 15 - 41 U/L   ALT 28 14 - 54 U/L   Alkaline Phosphatase 63 38 - 126 U/L   Total Bilirubin 0.3 0.3 - 1.2 mg/dL   GFR calc non Af Amer >60 >60 mL/min   GFR calc Af Amer >60 >60 mL/min   Anion gap 5 5 - 15  hCG, serum, qualitative     Status: None   Collection Time: 12/25/16  9:21 PM  Result Value Ref Range   Preg, Serum NEGATIVE NEGATIVE    --/--/A POS (06/02 1425)  Imaging:  No results found.  MAU Course/MDM: I have ordered labs as follows: see above,   All results are negative. She is not pregnant. Glucose is elevated as it has been on all past testing.  + BV  Qualitative pregnancy test is negative (done at patient's insistence, that her UPTs are always falsely negative) Imaging ordered: none Results reviewed. Patient is nontoxic appearing. She appears comfortable. Discussed results in detail.  Will rx Flagyl for BV and treat presumptively for UTI due to nitrites and symptoms.  Will send urine for culture Tylenol given for pain.   Pt stable at time of discharge.  Assessment: Acute cystitis with hematuria - Plan: Discharge patient  Non-intractable vomiting with nausea, unspecified vomiting type - Plan: Discharge patient  Acute  bilateral low back pain without sciatica - Plan: Discharge patient  Lower abdominal pain - Plan: Discharge patient  BV (bacterial vaginosis) - Plan: Discharge patient   Plan: Discharge home Recommend conservative care for back pain.   Rx sent for Phenergan  for nausea and vomiting, warned no driving Rx Septra x 3 d for UTI Urine to culture Rx Flagyl for BV   Encouraged to return here or to other Urgent Care/ED if she develops worsening of symptoms, increase in pain, fever, or other concerning symptoms.   Wynelle BourgeoisMarie Laporsha Grealish CNM, MSN Certified Nurse-Midwife 12/25/2016 8:59 PM

## 2016-12-25 NOTE — Discharge Instructions (Signed)
Bacterial Vaginosis °Bacterial vaginosis is a vaginal infection that occurs when the normal balance of bacteria in the vagina is disrupted. It results from an overgrowth of certain bacteria. This is the most common vaginal infection among women ages 15-44. °Because bacterial vaginosis increases your risk for STIs (sexually transmitted infections), getting treated can help reduce your risk for chlamydia, gonorrhea, herpes, and HIV (human immunodeficiency virus). Treatment is also important for preventing complications in pregnant women, because this condition can cause an early (premature) delivery. °What are the causes? °This condition is caused by an increase in harmful bacteria that are normally present in small amounts in the vagina. However, the reason that the condition develops is not fully understood. °What increases the risk? °The following factors may make you more likely to develop this condition: °· Having a new sexual partner or multiple sexual partners. °· Having unprotected sex. °· Douching. °· Having an intrauterine device (IUD). °· Smoking. °· Drug and alcohol abuse. °· Taking certain antibiotic medicines. °· Being pregnant. °You cannot get bacterial vaginosis from toilet seats, bedding, swimming pools, or contact with objects around you. °What are the signs or symptoms? °Symptoms of this condition include: °· Grey or white vaginal discharge. The discharge can also be watery or foamy. °· A fish-like odor with discharge, especially after sexual intercourse or during menstruation. °· Itching in and around the vagina. °· Burning or pain with urination. °Some women with bacterial vaginosis have no signs or symptoms. °How is this diagnosed? °This condition is diagnosed based on: °· Your medical history. °· A physical exam of the vagina. °· Testing a sample of vaginal fluid under a microscope to look for a large amount of bad bacteria or abnormal cells. Your health care provider may use a cotton swab or a  small wooden spatula to collect the sample. °How is this treated? °This condition is treated with antibiotics. These may be given as a pill, a vaginal cream, or a medicine that is put into the vagina (suppository). If the condition comes back after treatment, a second round of antibiotics may be needed. °Follow these instructions at home: °Medicines  °· Take over-the-counter and prescription medicines only as told by your health care provider. °· Take or use your antibiotic as told by your health care provider. Do not stop taking or using the antibiotic even if you start to feel better. °General instructions  °· If you have a female sexual partner, tell her that you have a vaginal infection. She should see her health care provider and be treated if she has symptoms. If you have a female sexual partner, he does not need treatment. °· During treatment: °¨ Avoid sexual activity until you finish treatment. °¨ Do not douche. °¨ Avoid alcohol as directed by your health care provider. °¨ Avoid breastfeeding as directed by your health care provider. °· Drink enough water and fluids to keep your urine clear or pale yellow. °· Keep the area around your vagina and rectum clean. °¨ Wash the area daily with warm water. °¨ Wipe yourself from front to back after using the toilet. °· Keep all follow-up visits as told by your health care provider. This is important. °How is this prevented? °· Do not douche. °· Wash the outside of your vagina with warm water only. °· Use protection when having sex. This includes latex condoms and dental dams. °· Limit how many sexual partners you have. To help prevent bacterial vaginosis, it is best to have sex with just one   partner (monogamous).  Make sure you and your sexual partner are tested for STIs.  Wear cotton or cotton-lined underwear.  Avoid wearing tight pants and pantyhose, especially during summer.  Limit the amount of alcohol that you drink.  Do not use any products that contain  nicotine or tobacco, such as cigarettes and e-cigarettes. If you need help quitting, ask your health care provider.  Do not use illegal drugs. Where to find more information:  Centers for Disease Control and Prevention: SolutionApps.co.zawww.cdc.gov/std  American Sexual Health Association (ASHA): www.ashastd.org  U.S. Department of Health and Health and safety inspectorHuman Services, Office on Women's Health: ConventionalMedicines.siwww.womenshealth.gov/ or http://www.anderson-williamson.info/https://www.womenshealth.gov/a-z-topics/bacterial-vaginosis Contact a health care provider if:  Your symptoms do not improve, even after treatment.  You have more discharge or pain when urinating.  You have a fever.  You have pain in your abdomen.  You have pain during sex.  You have vaginal bleeding between periods. Summary  Bacterial vaginosis is a vaginal infection that occurs when the normal balance of bacteria in the vagina is disrupted.  Because bacterial vaginosis increases your risk for STIs (sexually transmitted infections), getting treated can help reduce your risk for chlamydia, gonorrhea, herpes, and HIV (human immunodeficiency virus). Treatment is also important for preventing complications in pregnant women, because the condition can cause an early (premature) delivery.  This condition is treated with antibiotic medicines. These may be given as a pill, a vaginal cream, or a medicine that is put into the vagina (suppository). This information is not intended to replace advice given to you by your health care provider. Make sure you discuss any questions you have with your health care provider. Document Released: 08/06/2005 Document Revised: 04/21/2016 Document Reviewed: 04/21/2016 Elsevier Interactive Patient Education  2017 [employer]. Urinary Tract Infection, Adult A urinary tract infection (UTI) is an infection of any part of the urinary tract. The urinary tract includes the:  Kidneys.  Ureters.  Bladder.  Urethra. These organs make, store, and get rid of pee (urine)  in the body. Follow these instructions at home:  Take over-the-counter and prescription medicines only as told by your doctor.  If you were prescribed an antibiotic medicine, take it as told by your doctor. Do not stop taking the antibiotic even if you start to feel better.  Avoid the following drinks:  Alcohol.  Caffeine.  Tea.  Carbonated drinks.  Drink enough fluid to keep your pee clear or pale yellow.  Keep all follow-up visits as told by your doctor. This is important.  Make sure to:  Empty your bladder often and completely. Do not to hold pee for long periods of time.  Empty your bladder before and after sex.  Wipe from front to back after a bowel movement if you are female. Use each tissue one time when you wipe. Contact a doctor if:  You have back pain.  You have a fever.  You feel sick to your stomach (nauseous).  You throw up (vomit).  Your symptoms do not get better after 3 days.  Your symptoms go away and then come back. Get help right away if:  You have very bad back pain.  You have very bad lower belly (abdominal) pain.  You are throwing up and cannot keep down any medicines or water. This information is not intended to replace advice given to you by your health care provider. Make sure you discuss any questions you have with your health care provider. Document Released: 01/23/2008 Document Revised: 01/12/2016 Document Reviewed: 06/27/2015 Elsevier Interactive Patient  Education  2017 [employer].

## 2016-12-25 NOTE — MAU Note (Signed)
Pt states she has lower back and abdominal pain that started today. States she started having vag bleeding today also. States this is not her period. LMP was back in March. Did not take anything for pain today.

## 2016-12-26 LAB — GC/CHLAMYDIA PROBE AMP (~~LOC~~) NOT AT ARMC
CHLAMYDIA, DNA PROBE: NEGATIVE
NEISSERIA GONORRHEA: NEGATIVE

## 2016-12-26 LAB — HIV ANTIBODY (ROUTINE TESTING W REFLEX): HIV Screen 4th Generation wRfx: NONREACTIVE

## 2016-12-27 LAB — URINE CULTURE

## 2017-02-19 ENCOUNTER — Encounter (HOSPITAL_COMMUNITY): Payer: Self-pay | Admitting: Emergency Medicine

## 2017-02-19 ENCOUNTER — Emergency Department (HOSPITAL_COMMUNITY)
Admission: EM | Admit: 2017-02-19 | Discharge: 2017-02-20 | Disposition: A | Discharge status: Home / Self Care | Payer: Medicaid Other | Attending: Emergency Medicine | Admitting: Emergency Medicine

## 2017-02-19 DIAGNOSIS — Z79899 Other long term (current) drug therapy: Secondary | ICD-10-CM | POA: Diagnosis not present

## 2017-02-19 DIAGNOSIS — M25512 Pain in left shoulder: Secondary | ICD-10-CM | POA: Insufficient documentation

## 2017-02-19 DIAGNOSIS — S39012A Strain of muscle, fascia and tendon of lower back, initial encounter: Secondary | ICD-10-CM | POA: Insufficient documentation

## 2017-02-19 DIAGNOSIS — Y929 Unspecified place or not applicable: Secondary | ICD-10-CM | POA: Insufficient documentation

## 2017-02-19 DIAGNOSIS — Y999 Unspecified external cause status: Secondary | ICD-10-CM | POA: Diagnosis not present

## 2017-02-19 DIAGNOSIS — Y939 Activity, unspecified: Secondary | ICD-10-CM | POA: Diagnosis not present

## 2017-02-19 DIAGNOSIS — R51 Headache: Secondary | ICD-10-CM | POA: Diagnosis not present

## 2017-02-19 DIAGNOSIS — X509XXA Other and unspecified overexertion or strenuous movements or postures, initial encounter: Secondary | ICD-10-CM | POA: Diagnosis not present

## 2017-02-19 DIAGNOSIS — S3992XA Unspecified injury of lower back, initial encounter: Secondary | ICD-10-CM | POA: Diagnosis present

## 2017-02-19 DIAGNOSIS — Z91040 Latex allergy status: Secondary | ICD-10-CM | POA: Diagnosis not present

## 2017-02-19 DIAGNOSIS — R519 Headache, unspecified: Secondary | ICD-10-CM

## 2017-02-19 LAB — I-STAT BETA HCG BLOOD, ED (MC, WL, AP ONLY): I-stat hCG, quantitative: <5 m[IU]/mL (ref <5)

## 2017-02-19 MED ORDER — METOCLOPRAMIDE HCL 5 MG/ML IJ SOLN
10.0000 mg | Freq: Once | INTRAMUSCULAR | Status: AC
  Administered 2017-02-19: 10 mg via INTRAMUSCULAR
  Filled 2017-02-19: qty 2

## 2017-02-19 NOTE — ED Provider Notes (Signed)
[facility]-EMERGENCY DEPT Provider Note   CSN: [id number] Arrival date & time: 02/19/17  2213    History   Chief Complaint Chief Complaint  Patient presents with  . Back Pain  . Arm Pain  . Shoulder Pain    HPI Monique Schmidt is a 31 y.o. female.  Patient is a G19P5 female with history of headaches who presents to the emergency department for multiple complaints. She reports onset of left shoulder and low back pain 2-3 days ago. Pain has been intermittent. She notes worsening of her shoulder pain with movement and pain in her back with walking. She denies any alleviating factors of her symptoms and denies taking any medications for her pain. She has also noted a sharp intermittent occipital headache which feels worse than her typical headaches. She has not had any recent head injury or trauma. She denies heavy lifting. No fevers, vision changes, vision loss, hearing changes, photophobia or phonophobia, nausea, vomiting, extremity numbness or paresthesias, extremity weakness. No bowel or bladder incontinence. Patient expresses concern about preeclampsia. She believes that she may be pregnant. Last menstrual period was at the end of May. She states that she was last sexually active 2 weeks ago. She reports unprotected sexual intercourse with one partner over the past 6 months.      Past Medical History:  Diagnosis Date  . Abnormal Pap smear   . Gestational diabetes    likely pre-exisiting; all pregnancies glyburide started 11/4  . Gestational thrombocytopenia (HCC)   . Headache(784.0)   . Hernia, umbilical   . History of abnormal cervical Pap smear    normal 2014  . Mild preeclampsia delivered 01/22/2016    Patient Active Problem List   Diagnosis Date Noted  . BV (bacterial vaginosis) 02/27/2016  . BMI 50.0-59.9, adult (HCC) 11/09/2013  . Moderate persistent asthma 08/07/2013    Past Surgical History:  Procedure Laterality Date  . UMBILICAL HERNIA REPAIR N/A 11/22/2014   Procedure: LAPAROSCOPIC UMBILICAL HERNIA;  Surgeon: Axel Filler, MD;  Location: [facility] OR;  Service: General;  Laterality: N/A;  . WISDOM TOOTH EXTRACTION      OB History    Gravida Para Term Preterm AB Living   6 5 5   1 5    SAB TAB Ectopic Multiple Live Births   1     0 5       Home Medications    Prior to Admission medications   Medication Sig Start Date End Date Taking? Authorizing Provider  albuterol (PROVENTIL HFA) 108 (90 Base) MCG/ACT inhaler Inhale 2 puffs into the lungs every 4 (four) hours as needed for wheezing or shortness of breath. Reported on 02/27/2016 08/03/13   [provider]  ondansetron (ZOFRAN ODT) 4 MG disintegrating tablet Take 1 tablet (4 mg total) by mouth every 8 (eight) hours as needed for nausea or vomiting. 11/03/16   Street, [place], PA-C  Oxycodone HCl 10 MG TABS Take 10 mg by mouth 3 (three) times daily. 01/25/17   [provider]  promethazine (PHENERGAN) 25 MG tablet Take 1 tablet (25 mg total) by mouth every 6 (six) hours as needed for nausea or vomiting. 12/25/16   Aviva Signs, CNM  sulfamethoxazole-trimethoprim (BACTRIM DS,SEPTRA DS) 800-160 MG tablet Take 1 tablet by mouth 2 (two) times daily. 12/25/16   Aviva Signs, CNM    Family History Family History  Problem Relation Age of Onset  . Hypertension Father   . Diabetes Father   . Diabetes Mother   .  Seizures Brother     Social History Social History  Substance Use Topics  . Smoking status: Never Smoker  . Smokeless tobacco: Never Used  . Alcohol use No     Allergies   Latex and Penicillins   Review of Systems Review of Systems Ten systems reviewed and are negative for acute change, except as noted in the HPI.    Physical Exam Updated Vital Signs BP 140/86 (BP Location: Right Arm)   Pulse 97   Temp 98.3 F (36.8 C) (Oral)   Resp 16   Ht 5\' 6"  (1.676 m)   Wt (!) 138.3 kg (305 lb)   SpO2 100%   BMI 49.23 kg/m   Physical Exam  Constitutional:  She is oriented to person, place, and time. She appears well-developed and well-nourished. No distress.  Nontoxic and in NAD  HENT:  Head: Normocephalic and atraumatic.  Mouth/Throat: Oropharynx is clear and moist.  Eyes: Conjunctivae and EOM are normal. Pupils are equal, round, and reactive to light. No scleral icterus.  EOMs normal. No nystagmus.   Neck: Normal range of motion.  No nuchal rigidity or meningismus  Cardiovascular: Normal rate, regular rhythm and intact distal pulses.   Pulmonary/Chest: Effort normal. No respiratory distress. She has no wheezes.  Respirations even and and unlabored  Abdominal:  Obese  Musculoskeletal: Normal range of motion.  Neurological: She is alert and oriented to person, place, and time. No cranial nerve deficit. She exhibits normal muscle tone. Coordination normal.  GCS 15. Speech is goal oriented. No cranial nerve deficits appreciated; symmetric eyebrow raise, no facial drooping, tongue midline. Patient has equal grip strength bilaterally with 5/5 strength against resistance in all major muscle groups bilaterally. Sensation to light touch intact. Patient moves extremities without ataxia. Normal finger-nose-finger. Patient ambulatory with steady gait.  Skin: Skin is warm and dry. No rash noted. She is not diaphoretic. No erythema. No pallor.  Psychiatric: She has a normal mood and affect. Her behavior is normal.  Nursing note and vitals reviewed.    ED Treatments / Results  Labs (all labs ordered are listed, but only abnormal results are displayed) Labs Reviewed  URINALYSIS, ROUTINE W REFLEX MICROSCOPIC - Abnormal; Notable for the following:       Result Value   Specific Gravity, Urine >1.030 (*)    All other components within normal limits  I-STAT BETA HCG BLOOD, ED ([facility], [facility], [facility] ONLY)    EKG  EKG Interpretation None       Radiology No results found.  Procedures Procedures (including critical care time)  Medications Ordered in  ED Medications  metoCLOPramide (REGLAN) injection 10 mg (not administered)     Initial Impression / Assessment and Plan / ED Course  I have reviewed the triage vital signs and the nursing notes.  Pertinent labs & imaging results that were available during my care of the patient were reviewed by me and considered in my medical decision making (see chart for details).     31 year old female presents to the emergency department for multiple complaints. She reports shoulder and back pain which are consistent with musculoskeletal etiology. No red flags or signs concerning for cauda equina. Patient neurovascularly intact. Patient expresses concern for possible pregnancy. This is negative. No concern for ectopic. UA also does not suggest UTI. No hematuria to suggest kidney stones.   She also notes a headache. Neurologic exam nonfocal. No fever, nuchal rigidity, or meningismus. Headache has improved with Reglan. Symptoms atypical for emergent intracranial process.  I do not believe further work up with CT is indicated. Plan for follow-up with the patient's primary care doctor on an outpatient basis. Supportive therapy indicated with return if symptoms worsen. Patient discharged in stable condition with no unaddressed concerns.   Final Clinical Impressions(s) / ED Diagnoses   Final diagnoses:  Bad headache  Left shoulder pain, unspecified chronicity  Strain of lumbar region, initial encounter    New Prescriptions New Prescriptions   No medications on file     Antony Madura, PA-C 02/26/17 0530    Lavera Guise, MD 02/27/17 [phone]

## 2017-02-19 NOTE — ED Triage Notes (Signed)
Patient complaining of left arm and shoulder pain. Patient did not have any injury to arm. Patient also states she is having lower back pain and a headache. Patient states this started 3 days ago. Patient has not taken anything for the pain.

## 2017-02-20 LAB — URINALYSIS, ROUTINE W REFLEX MICROSCOPIC
BILIRUBIN URINE: NEGATIVE
GLUCOSE, UA: NEGATIVE mg/dL
HGB URINE DIPSTICK: NEGATIVE
KETONES UR: NEGATIVE mg/dL
Leukocytes, UA: NEGATIVE
NITRITE: NEGATIVE
PH: 6 (ref 5.0–8.0)
Protein, ur: NEGATIVE mg/dL

## 2017-02-20 MED ORDER — NAPROXEN 500 MG PO TABS: 500.0000 mg | ORAL_TABLET | Freq: Two times a day (BID) | ORAL | 0 refills | Status: DC

## 2017-02-20 MED ORDER — METHOCARBAMOL 500 MG PO TABS
500.0000 mg | ORAL_TABLET | Freq: Two times a day (BID) | ORAL | 0 refills | Status: DC
Start: 2017-02-20 — End: 2017-09-06

## 2017-04-25 ENCOUNTER — Encounter (HOSPITAL_COMMUNITY): Payer: Self-pay | Admitting: *Deleted

## 2017-04-25 ENCOUNTER — Emergency Department (HOSPITAL_COMMUNITY)
Admission: EM | Admit: 2017-04-25 | Discharge: 2017-04-25 | Disposition: A | Discharge status: Home / Self Care | Payer: Medicaid Other | Attending: Emergency Medicine | Admitting: Emergency Medicine

## 2017-04-25 DIAGNOSIS — R109 Unspecified abdominal pain: Secondary | ICD-10-CM | POA: Insufficient documentation

## 2017-04-25 LAB — COMPREHENSIVE METABOLIC PANEL
ALK PHOS: 63 U/L (ref 38–126)
ALT: 21 U/L (ref 14–54)
ANION GAP: 10 (ref 5–15)
AST: 26 U/L (ref 15–41)
Albumin: 3.6 g/dL (ref 3.5–5.0)
BILIRUBIN TOTAL: 0.6 mg/dL (ref 0.3–1.2)
BUN: 7 mg/dL (ref 6–20)
CALCIUM: 8.9 mg/dL (ref 8.9–10.3)
CO2: 25 mmol/L (ref 22–32)
CREATININE: 0.61 mg/dL (ref 0.44–1.00)
Chloride: 100 mmol/L — ABNORMAL LOW (ref 101–111)
GFR calc non Af Amer: >60 mL/min (ref >60)
Glucose, Bld: 248 mg/dL — ABNORMAL HIGH (ref 65–99)
Potassium: 3.6 mmol/L (ref 3.5–5.1)
SODIUM: 135 mmol/L (ref 135–145)
TOTAL PROTEIN: 6.9 g/dL (ref 6.5–8.1)

## 2017-04-25 LAB — URINALYSIS, ROUTINE W REFLEX MICROSCOPIC
BILIRUBIN URINE: NEGATIVE
Glucose, UA: NEGATIVE mg/dL
Hgb urine dipstick: NEGATIVE
Ketones, ur: NEGATIVE mg/dL
NITRITE: NEGATIVE
PH: 5 (ref 5.0–8.0)
Protein, ur: 30 mg/dL — AB
SPECIFIC GRAVITY, URINE: 1.031 — AB (ref 1.005–1.030)

## 2017-04-25 LAB — CBC
HCT: 39.8 % (ref 36.0–46.0)
Hemoglobin: 13.2 g/dL (ref 12.0–15.0)
MCH: 27.2 pg (ref 26.0–34.0)
MCHC: 33.2 g/dL (ref 30.0–36.0)
MCV: 82.1 fL (ref 78.0–100.0)
PLATELETS: 141 10*3/uL — AB (ref 150–400)
RBC: 4.85 MIL/uL (ref 3.87–5.11)
RDW: 14.9 % (ref 11.5–15.5)
WBC: 7.4 10*3/uL (ref 4.0–10.5)

## 2017-04-25 LAB — LIPASE, BLOOD: Lipase: 23 U/L (ref 11–51)

## 2017-04-25 NOTE — ED Notes (Signed)
Kimberly(EMT) called pt x3, no response.

## 2017-04-25 NOTE — ED Triage Notes (Signed)
Pt states that she has had abdominal pain, back pain and a headache starting today. Reports nausea

## 2017-04-26 ENCOUNTER — Inpatient Hospital Stay (HOSPITAL_COMMUNITY)
Admission: AD | Admit: 2017-04-26 | Discharge: 2017-04-27 | Disposition: A | Discharge status: Home / Self Care | Payer: Medicaid Other | Source: Ambulatory Visit | Attending: Obstetrics and Gynecology | Admitting: Obstetrics and Gynecology

## 2017-04-26 DIAGNOSIS — M545 Low back pain, unspecified: Secondary | ICD-10-CM

## 2017-04-26 DIAGNOSIS — Z3202 Encounter for pregnancy test, result negative: Secondary | ICD-10-CM | POA: Insufficient documentation

## 2017-04-26 DIAGNOSIS — Z880 Allergy status to penicillin: Secondary | ICD-10-CM | POA: Insufficient documentation

## 2017-04-27 ENCOUNTER — Encounter (HOSPITAL_COMMUNITY): Payer: Self-pay | Admitting: *Deleted

## 2017-04-27 DIAGNOSIS — M545 Low back pain: Secondary | ICD-10-CM

## 2017-04-27 DIAGNOSIS — Z880 Allergy status to penicillin: Secondary | ICD-10-CM | POA: Diagnosis not present

## 2017-04-27 DIAGNOSIS — Z3202 Encounter for pregnancy test, result negative: Secondary | ICD-10-CM | POA: Diagnosis not present

## 2017-04-27 DIAGNOSIS — R109 Unspecified abdominal pain: Secondary | ICD-10-CM | POA: Diagnosis present

## 2017-04-27 LAB — COMPREHENSIVE METABOLIC PANEL
ALBUMIN: 3.6 g/dL (ref 3.5–5.0)
ALK PHOS: 67 U/L (ref 38–126)
ALT: 20 U/L (ref 14–54)
ANION GAP: 10 (ref 5–15)
AST: 25 U/L (ref 15–41)
BUN: 8 mg/dL (ref 6–20)
CALCIUM: 9.1 mg/dL (ref 8.9–10.3)
CO2: 27 mmol/L (ref 22–32)
Chloride: 102 mmol/L (ref 101–111)
Creatinine, Ser: 0.59 mg/dL (ref 0.44–1.00)
GFR calc Af Amer: >60 mL/min (ref >60)
GFR calc non Af Amer: >60 mL/min (ref >60)
GLUCOSE: 214 mg/dL — AB (ref 65–99)
Potassium: 3.7 mmol/L (ref 3.5–5.1)
SODIUM: 139 mmol/L (ref 135–145)
Total Bilirubin: 0.3 mg/dL (ref 0.3–1.2)
Total Protein: 7.3 g/dL (ref 6.5–8.1)

## 2017-04-27 LAB — URINALYSIS, ROUTINE W REFLEX MICROSCOPIC
Bilirubin Urine: NEGATIVE
Glucose, UA: NEGATIVE mg/dL
Hgb urine dipstick: NEGATIVE
Ketones, ur: NEGATIVE mg/dL
LEUKOCYTES UA: NEGATIVE
Nitrite: NEGATIVE
PROTEIN: NEGATIVE mg/dL
SPECIFIC GRAVITY, URINE: 1.025 (ref 1.005–1.030)
pH: 6 (ref 5.0–8.0)

## 2017-04-27 LAB — CBC
HEMATOCRIT: 38.8 % (ref 36.0–46.0)
HEMOGLOBIN: 13.2 g/dL (ref 12.0–15.0)
MCH: 27.9 pg (ref 26.0–34.0)
MCHC: 34 g/dL (ref 30.0–36.0)
MCV: 82 fL (ref 78.0–100.0)
Platelets: 138 10*3/uL — ABNORMAL LOW (ref 150–400)
RBC: 4.73 MIL/uL (ref 3.87–5.11)
RDW: 15.3 % (ref 11.5–15.5)
WBC: 7 10*3/uL (ref 4.0–10.5)

## 2017-04-27 LAB — POCT PREGNANCY, URINE: PREG TEST UR: NEGATIVE

## 2017-04-27 MED ORDER — CYCLOBENZAPRINE HCL 10 MG PO TABS: 10.0000 mg | ORAL_TABLET | Freq: Two times a day (BID) | ORAL | 0 refills | Status: DC | PRN

## 2017-04-27 MED ORDER — CYCLOBENZAPRINE HCL 10 MG PO TABS
10.0000 mg | ORAL_TABLET | Freq: Once | ORAL | Status: DC
  Filled 2017-04-27: qty 1

## 2017-04-27 NOTE — MAU Note (Signed)
Having abd pain for 2 days and back pain for a wk. [facility]. Pain in lower abd and lower back. Pain is sharp and cramping. Some vag d/c that is white with foul odor.

## 2017-04-27 NOTE — Progress Notes (Signed)
Rockwell GermanyLauren Fields RN gave pt written and verbal d/c instructions and pt voiced understanding

## 2017-04-27 NOTE — MAU Provider Note (Signed)
History     CSN: [id number]  Arrival date and time: 04/26/17 2357   First Provider Initiated Contact with Patient 04/27/17 [phone]      Chief Complaint  Patient presents with  . Abdominal Pain  . Back Pain   Monique Schmidt is a 31 y.o. [id number] at Unknown who presents today with back pain and abdominal pain x 1 week. She states that she has been taking oxyconde for the pain at home, but she has run out of that medication.    Abdominal Pain  This is a new problem. The current episode started in the past 7 days. The problem occurs constantly. The problem has been unchanged. The pain is located in the periumbilical region ("below my belly button"). The pain is at a severity of 9/10. The quality of the pain is sharp. The abdominal pain radiates to the back. Associated symptoms include frequency and nausea. Pertinent negatives include no dysuria, fever or vomiting. Nothing aggravates the pain. She has tried oral narcotic analgesics and acetaminophen (ran out of oxycodone. ) for the symptoms. The treatment provided moderate relief.  Back Pain  This is a new problem. The current episode started in the past 7 days. The problem occurs constantly. The problem is unchanged. The pain is present in the thoracic spine, sacro-iliac, lumbar spine and gluteal. The pain is at a severity of 9/10. Associated symptoms include abdominal pain. Pertinent negatives include no dysuria, fever or pelvic pain. She has tried analgesics for the symptoms. The treatment provided moderate relief.   Past Medical History:  Diagnosis Date  . Abnormal Pap smear   . Gestational diabetes    likely pre-exisiting; all pregnancies glyburide started 11/4  . Gestational thrombocytopenia (HCC)   . Headache(784.0)   . Hernia, umbilical   . History of abnormal cervical Pap smear    normal 2014  . Mild preeclampsia delivered 01/22/2016    Past Surgical History:  Procedure Laterality Date  . UMBILICAL HERNIA REPAIR N/A 11/22/2014    Procedure: LAPAROSCOPIC UMBILICAL HERNIA;  Surgeon: Axel Filler, MD;  Location: [facility] OR;  Service: General;  Laterality: N/A;  . WISDOM TOOTH EXTRACTION      Family History  Problem Relation Age of Onset  . Hypertension Father   . Diabetes Father   . Diabetes Mother   . Seizures Brother     Social History  Substance Use Topics  . Smoking status: Never Smoker  . Smokeless tobacco: Never Used  . Alcohol use No    Allergies:  Allergies  Allergen Reactions  . Latex Itching and Rash  . Penicillins Itching and Rash    Has patient had a PCN reaction causing immediate rash, facial/tongue/throat swelling, SOB or lightheadedness with hypotension: Yes Has patient had a PCN reaction causing severe rash involving mucus membranes or skin necrosis: Yes Has patient had a PCN reaction that required hospitalization No Has patient had a PCN reaction occurring within the last 10 years: Yes If all of the above answers are "NO", then may proceed with Cephalosporin use.     Prescriptions Prior to Admission  Medication Sig Dispense Refill Last Dose  . albuterol (PROVENTIL HFA) 108 (90 Base) MCG/ACT inhaler Inhale 2 puffs into the lungs every 4 (four) hours as needed for wheezing or shortness of breath. Reported on 02/27/2016   unk  . methocarbamol (ROBAXIN) 500 MG tablet Take 1 tablet (500 mg total) by mouth 2 (two) times daily. 20 tablet 0   . naproxen (NAPROSYN) 500 MG  tablet Take 1 tablet (500 mg total) by mouth 2 (two) times daily. 30 tablet 0   . ondansetron (ZOFRAN ODT) 4 MG disintegrating tablet Take 1 tablet (4 mg total) by mouth every 8 (eight) hours as needed for nausea or vomiting. 15 tablet 0   . Oxycodone HCl 10 MG TABS Take 10 mg by mouth 3 (three) times daily.  0   . promethazine (PHENERGAN) 25 MG tablet Take 1 tablet (25 mg total) by mouth every 6 (six) hours as needed for nausea or vomiting. 30 tablet 2   . sulfamethoxazole-trimethoprim (BACTRIM DS,SEPTRA DS) 800-160 MG tablet Take  1 tablet by mouth 2 (two) times daily. 6 tablet 0     Review of Systems  Constitutional: Negative for chills and fever.  Gastrointestinal: Positive for abdominal pain and nausea. Negative for vomiting.  Genitourinary: Positive for frequency. Negative for dysuria, pelvic pain, vaginal bleeding and vaginal discharge.  Musculoskeletal: Positive for back pain.   Physical Exam   Blood pressure (!) 142/88, pulse 77, temperature 98.2 F (36.8 C), resp. rate 18, height  (1.676 m), weight (!) 327 lb (148.3 kg), last menstrual period 01/23/2017, unknown if currently breastfeeding.  Physical Exam  Nursing note and vitals reviewed. Constitutional: She is oriented to person, place, and time. She appears well-developed and well-nourished. No distress.  HENT:  Head: Normocephalic.  Cardiovascular: Normal rate.   Respiratory: Effort normal.  GI: Soft. There is no tenderness. There is no rebound.  Genitourinary:  Genitourinary Comments: No CVA tenderness.   Musculoskeletal: She exhibits no edema or tenderness.  Neurological: She is alert and oriented to person, place, and time.  Skin: Skin is warm and dry.  Psychiatric: She has a normal mood and affect.   Results for orders placed or performed during the hospital encounter of 04/26/17 (from the past 24 hour(s))  Urinalysis, Routine w reflex microscopic     Status: Abnormal   Collection Time: 04/27/17  1:05 AM  Result Value Ref Range   Color, Urine YELLOW YELLOW   APPearance HAZY (A) CLEAR   Specific Gravity, Urine 1.025 1.005 - 1.030   pH 6.0 5.0 - 8.0   Glucose, UA NEGATIVE NEGATIVE mg/dL   Hgb urine dipstick NEGATIVE NEGATIVE   Bilirubin Urine NEGATIVE NEGATIVE   Ketones, ur NEGATIVE NEGATIVE mg/dL   Protein, ur NEGATIVE NEGATIVE mg/dL   Nitrite NEGATIVE NEGATIVE   Leukocytes, UA NEGATIVE NEGATIVE  Pregnancy, urine POC     Status: None   Collection Time: 04/27/17  1:46 AM  Result Value Ref Range   Preg Test, Ur NEGATIVE NEGATIVE   CBC     Status: Abnormal   Collection Time: 04/27/17  4:05 AM  Result Value Ref Range   WBC 7.0 4.0 - 10.5 K/uL   RBC 4.73 3.87 - 5.11 MIL/uL   Hemoglobin 13.2 12.0 - 15.0 g/dL   HCT 91.4 78.2 - 95.6 %   MCV 82.0 78.0 - 100.0 fL   MCH 27.9 26.0 - 34.0 pg   MCHC 34.0 30.0 - 36.0 g/dL   RDW 21.3 08.6 - 57.8 %   Platelets 138 (L) 150 - 400 K/uL  Comprehensive metabolic panel     Status: Abnormal   Collection Time: 04/27/17  4:05 AM  Result Value Ref Range   Sodium 139 135 - 145 mmol/L   Potassium 3.7 3.5 - 5.1 mmol/L   Chloride 102 101 - 111 mmol/L   CO2 27 22 - 32 mmol/L   Glucose, Bld 214 (  H) 65 - 99 mg/dL   BUN 8 6 - 20 mg/dL   Creatinine, Ser 1.610.59 0.44 - 1.00 mg/dL   Calcium 9.1 8.9 - 09.610.3 mg/dL   Total Protein 7.3 6.5 - 8.1 g/dL   Albumin 3.6 3.5 - 5.0 g/dL   AST 25 15 - 41 U/L   ALT 20 14 - 54 U/L   Alkaline Phosphatase 67 38 - 126 U/L   Total Bilirubin 0.3 0.3 - 1.2 mg/dL   GFR calc non Af Amer >60 >60 mL/min   GFR calc Af Amer >60 >60 mL/min   Anion gap 10 5 - 15    MAU Course  Procedures  MDM Patient drove herself here, so I can't give her muscle relaxer here. Will send home with rx for her to take as needed until she can see PCP for back pain.   Assessment and Plan   1. Acute midline low back pain without sciatica    DC home Comfort measures reviewed  RX: flexeril PRN #[id number]  Return to MAU as needed   Follow-up Information    [facility] [place] [facility].   Specialty:  Family Medicine Contact information: [address] [place] [id number] [phone]            Thressa ShellerHeather Hogan 04/27/2017, 4:42 AM

## 2017-04-27 NOTE — Discharge Instructions (Signed)
Back Exercises The following exercises strengthen the muscles that help to support the back. They also help to keep the lower back flexible. Doing these exercises can help to prevent back pain or lessen existing pain. If you have back pain or discomfort, try doing these exercises 2-3 times each day or as told by your health care provider. When the pain goes away, do them once each day, but increase the number of times that you repeat the steps for each exercise (do more repetitions). If you do not have back pain or discomfort, do these exercises once each day or as told by your health care provider. Exercises Single Knee to Chest  Repeat these steps 3-5 times for each leg: 1. Lie on your back on a firm bed or the floor with your legs extended. 2. Bring one knee to your chest. Your other leg should stay extended and in contact with the floor. 3. Hold your knee in place by grabbing your knee or thigh. 4. Pull on your knee until you feel a gentle stretch in your lower back. 5. Hold the stretch for 10-30 seconds. 6. Slowly release and straighten your leg.  Pelvic Tilt  Repeat these steps 5-10 times: 1. Lie on your back on a firm bed or the floor with your legs extended. 2. Bend your knees so they are pointing toward the ceiling and your feet are flat on the floor. 3. Tighten your lower abdominal muscles to press your lower back against the floor. This motion will tilt your pelvis so your tailbone points up toward the ceiling instead of pointing to your feet or the floor. 4. With gentle tension and even breathing, hold this position for 5-10 seconds.  Cat-Cow  Repeat these steps until your lower back becomes more flexible: 1. Get into a hands-and-knees position on a firm surface. Keep your hands under your shoulders, and keep your knees under your hips. You may place padding under your knees for comfort. 2. Let your head hang down, and point your tailbone toward the floor so your lower back  becomes rounded like the back of a cat. 3. Hold this position for 5 seconds. 4. Slowly lift your head and point your tailbone up toward the ceiling so your back forms a sagging arch like the back of a cow. 5. Hold this position for 5 seconds.  Press-Ups  Repeat these steps 5-10 times: 1. Lie on your abdomen (face-down) on the floor. 2. Place your palms near your head, about shoulder-width apart. 3. While you keep your back as relaxed as possible and keep your hips on the floor, slowly straighten your arms to raise the top half of your body and lift your shoulders. Do not use your back muscles to raise your upper torso. You may adjust the placement of your hands to make yourself more comfortable. 4. Hold this position for 5 seconds while you keep your back relaxed. 5. Slowly return to lying flat on the floor.  Bridges  Repeat these steps 10 times: 1. Lie on your back on a firm surface. 2. Bend your knees so they are pointing toward the ceiling and your feet are flat on the floor. 3. Tighten your buttocks muscles and lift your buttocks off of the floor until your waist is at almost the same height as your knees. You should feel the muscles working in your buttocks and the back of your thighs. If you do not feel these muscles, slide your feet 1-2 inches farther away   from your buttocks. 4. Hold this position for 3-5 seconds. 5. Slowly lower your hips to the starting position, and allow your buttocks muscles to relax completely.  If this exercise is too easy, try doing it with your arms crossed over your chest. Abdominal Crunches  Repeat these steps 5-10 times: 1. Lie on your back on a firm bed or the floor with your legs extended. 2. Bend your knees so they are pointing toward the ceiling and your feet are flat on the floor. 3. Cross your arms over your chest. 4. Tip your chin slightly toward your chest without bending your neck. 5. Tighten your abdominal muscles and slowly raise your  trunk (torso) high enough to lift your shoulder blades a tiny bit off of the floor. Avoid raising your torso higher than that, because it can put too much stress on your low back and it does not help to strengthen your abdominal muscles. 6. Slowly return to your starting position.  Back Lifts Repeat these steps 5-10 times: 1. Lie on your abdomen (face-down) with your arms at your sides, and rest your forehead on the floor. 2. Tighten the muscles in your legs and your buttocks. 3. Slowly lift your chest off of the floor while you keep your hips pressed to the floor. Keep the back of your head in line with the curve in your back. Your eyes should be looking at the floor. 4. Hold this position for 3-5 seconds. 5. Slowly return to your starting position.  Contact a health care provider if:  Your back pain or discomfort gets much worse when you do an exercise.  Your back pain or discomfort does not lessen within 2 hours after you exercise. If you have any of these problems, stop doing these exercises right away. Do not do them again unless your health care provider says that you can. Get help right away if:  You develop sudden, severe back pain. If this happens, stop doing the exercises right away. Do not do them again unless your health care provider says that you can. This information is not intended to replace advice given to you by your health care provider. Make sure you discuss any questions you have with your health care provider. Document Released: 09/13/2004 Document Revised: 12/14/2015 Document Reviewed: 09/30/2014 Elsevier Interactive Patient Education  2017 [employer]  Back Pain, Adult Back pain is very common. The pain often gets better over time. The cause of back pain is usually not dangerous. Most people can learn to manage their back pain on their own. Follow these instructions at home: Watch your back pain for any changes. The following actions may help to lessen any pain  you are feeling:  Stay active. Start with short walks on flat ground if you can. Try to walk farther each day.  Exercise regularly as told by your doctor. Exercise helps your back heal faster. It also helps avoid future injury by keeping your muscles strong and flexible.  Do not sit, drive, or stand in one place for more than 30 minutes.  Do not stay in bed. Resting more than 1-2 days can slow down your recovery.  Be careful when you bend or lift an object. Use good form when lifting: ? Bend at your knees. ? Keep the object close to your body. ? Do not twist.  Sleep on a firm mattress. Lie on your side, and bend your knees. If you lie on your back, put a pillow under your knees.    Take medicines only as told by your doctor.  Put ice on the injured area. ? Put ice in a plastic bag. ? Place a towel between your skin and the bag. ? Leave the ice on for 20 minutes, 2-3 times a day for the first 2-3 days. After that, you can switch between ice and heat packs.  Avoid feeling anxious or stressed. Find good ways to deal with stress, such as exercise.  Maintain a healthy weight. Extra weight puts stress on your back.  Contact a doctor if:  You have pain that does not go away with rest or medicine.  You have worsening pain that goes down into your legs or buttocks.  You have pain that does not get better in one week.  You have pain at night.  You lose weight.  You have a fever or chills. Get help right away if:  You cannot control when you poop (bowel movement) or pee (urinate).  Your arms or legs feel weak.  Your arms or legs lose feeling (numbness).  You feel sick to your stomach (nauseous) or throw up (vomit).  You have belly (abdominal) pain.  You feel like you may pass out (faint). This information is not intended to replace advice given to you by your health care provider. Make sure you discuss any questions you have with your health care provider. Document Released:  01/23/2008 Document Revised: 01/12/2016 Document Reviewed: 12/08/2013 Elsevier Interactive Patient Education  2018 Elsevier Inc.  

## 2017-04-28 LAB — URINE CULTURE

## 2017-06-22 ENCOUNTER — Encounter (HOSPITAL_COMMUNITY): Payer: Self-pay

## 2017-06-22 ENCOUNTER — Ambulatory Visit (HOSPITAL_COMMUNITY)
Admission: EM | Admit: 2017-06-22 | Discharge: 2017-06-22 | Disposition: A | Discharge status: Home / Self Care | Payer: Medicaid Other | Attending: Family Medicine | Admitting: Family Medicine

## 2017-06-22 DIAGNOSIS — H9201 Otalgia, right ear: Secondary | ICD-10-CM | POA: Insufficient documentation

## 2017-06-22 DIAGNOSIS — K529 Noninfective gastroenteritis and colitis, unspecified: Secondary | ICD-10-CM | POA: Diagnosis present

## 2017-06-22 LAB — POCT PREGNANCY, URINE: Preg Test, Ur: NEGATIVE

## 2017-06-22 MED ORDER — METRONIDAZOLE 500 MG PO TABS: 500.0000 mg | ORAL_TABLET | Freq: Two times a day (BID) | ORAL | 0 refills | Status: DC

## 2017-06-22 NOTE — ED Triage Notes (Signed)
Pt presents today with right ear pain. States that it has been bothering her since she went to a party and was around loud music. Feels like she is hearing waves. Also states she has had stomach pain and diarrhea for a couple of days with some nausea.

## 2017-06-24 LAB — URINE CYTOLOGY ANCILLARY ONLY
Chlamydia: NEGATIVE
NEISSERIA GONORRHEA: NEGATIVE
Trichomonas: NEGATIVE

## 2017-06-25 LAB — URINE CYTOLOGY ANCILLARY ONLY
Bacterial vaginitis: POSITIVE — AB
Candida vaginitis: NEGATIVE

## 2017-06-26 NOTE — ED Provider Notes (Signed)
[place]   [id number] 06/22/17 Arrival Time: 8329  ASSESSMENT & PLAN:  1. Right ear pain   2. Gastroenteritis     Meds ordered this encounter  Medications  . metroNIDAZOLE (FLAGYL) 500 MG tablet    Sig: Take 1 tablet (500 mg total) by mouth 2 (two) times daily.    Dispense:  14 tablet    Refill:  0   H/O BV and desires treatment. Urine cytology sent. As for ear discomfort, recommend f/u with ENT. Information given. May f/u here as needed.  Reviewed expectations re: course of current medical issues. Questions answered. Outlined signs and symptoms indicating need for more acute intervention. Patient verbalized understanding. After Visit Summary given.   SUBJECTIVE:  Monique Schmidt is a 31 y.o. female who presents with complaint of:  1. R ear discomfort. Gradual onset over a few days. Started after being around loud music for a few hours. "I hear a wave sound" in R ear. No drainage. Afebrile. Hearing normal from L ear. 2. Approx 2 days of n/v/d without blood. Much better today. Tolerating PO intake. Afebrile. No urinary symptoms. Mild and thin vaginal discharge. H/O BV and she reports this feels the same. No OTC treatment. No abdominal or pelvic pain.  ROS: As per HPI.   OBJECTIVE:  Vitals:   06/22/17 1702  BP: 114/70  Pulse: 94  Resp: 16  Temp: 97.7 F (36.5 C)  TempSrc: Oral  SpO2: 97%    General appearance: alert; no distress Eyes: PERRLA; EOMI; conjunctiva normal HENT: normocephalic; atraumatic; TMs appear overall normal; nasal mucosa normal; oral mucosa normal Neck: supple Lungs: clear to auscultation bilaterally Heart: regular rate and rhythm Abdomen: soft, non-tender; bowel sounds normal; no masses or organomegaly; no guarding or rebound tenderness Back: no CVA tenderness Extremities: no cyanosis or edema; symmetrical with no gross deformities Skin: warm and dry Neurologic: normal gait; normal symmetric reflexes Psychological: alert and  cooperative; normal mood and affect  Labs:  Labs Reviewed  URINE CYTOLOGY ANCILLARY ONLY - Abnormal; Notable for the following components:      Result Value   Bacterial vaginitis   (*)    Value: **POSITIVE for Atopobium vaginae POSITIVE for Gardnerella vaginalis**   All other components within normal limits  POCT PREGNANCY, URINE  URINE CYTOLOGY ANCILLARY ONLY     Allergies  Allergen Reactions  . Latex Itching and Rash  . Penicillins Itching and Rash    Has patient had a PCN reaction causing immediate rash, facial/tongue/throat swelling, SOB or lightheadedness with hypotension: Yes Has patient had a PCN reaction causing severe rash involving mucus membranes or skin necrosis: Yes Has patient had a PCN reaction that required hospitalization No Has patient had a PCN reaction occurring within the last 10 years: Yes If all of the above answers are "NO", then may proceed with Cephalosporin use.     Past Medical History:  Diagnosis Date  . Abnormal Pap smear   . Gestational diabetes    likely pre-exisiting; all pregnancies glyburide started 11/4  . Gestational thrombocytopenia ([place])   . Headache(784.0)   . Hernia, umbilical   . History of abnormal cervical Pap smear    normal 2014  . Mild preeclampsia delivered 01/22/2016   Social History   Socioeconomic History  . Marital status: Single    Spouse name: Not on file  . Number of children: Not on file  . Years of education: Not on file  . Highest education level: Not on file  Social Needs  . Financial resource strain: Not on file  . Food insecurity - worry: Not on file  . Food insecurity - inability: Not on file  . Transportation needs - medical: Not on file  . Transportation needs - non-medical: Not on file  Occupational History  . Not on file  Tobacco Use  . Smoking status: Never Smoker  . Smokeless tobacco: Never Used  Substance and Sexual Activity  . Alcohol use: No  . Drug use: No  . Sexual activity: Yes     Birth control/protection: None    Comment: lst time 1 week ago  Other Topics Concern  . Not on file  Social History Narrative  . Not on file   Family History  Problem Relation Age of Onset  . Hypertension Father   . Diabetes Father   . Diabetes Mother   . Seizures Brother    Past Surgical History:  Procedure Laterality Date  . WISDOM TOOTH EXTRACTION       Vanessa Kick, MD 06/26/17 [phone]

## 2017-08-12 IMAGING — US US MFM FETAL BPP W/O NON-STRESS
1 series · 12 of 15 positions shown · non-contrast
Comparison: none

[Series 1: us mfm fetal bpp w/o non-stress · 15 acquisitions, 12 frames shown]
[im 1/15]
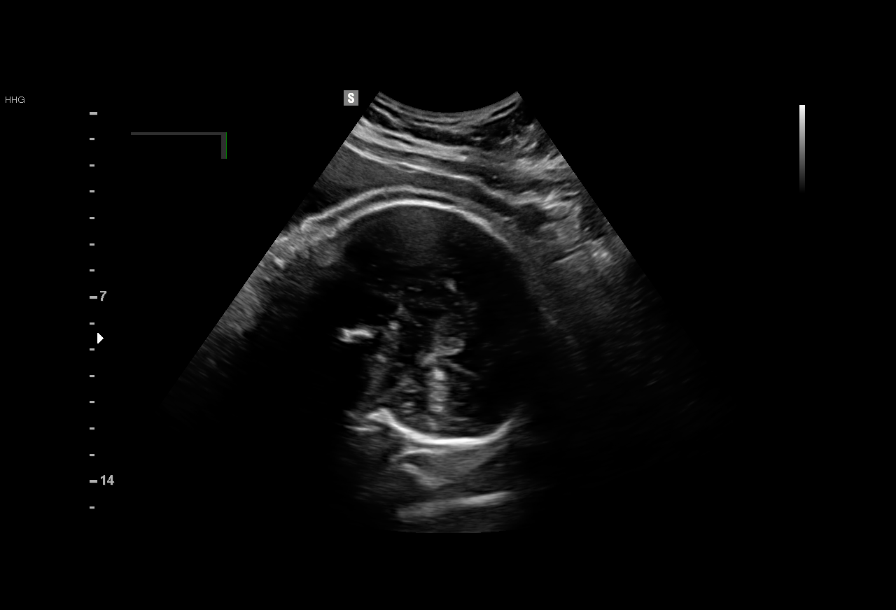
[im 2/15]
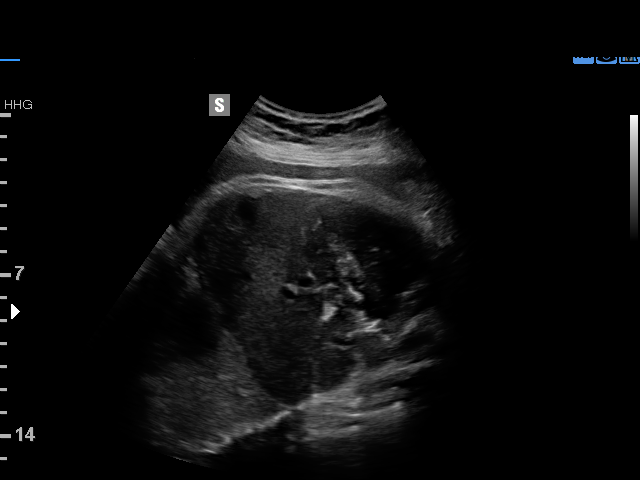
[im 4/15]
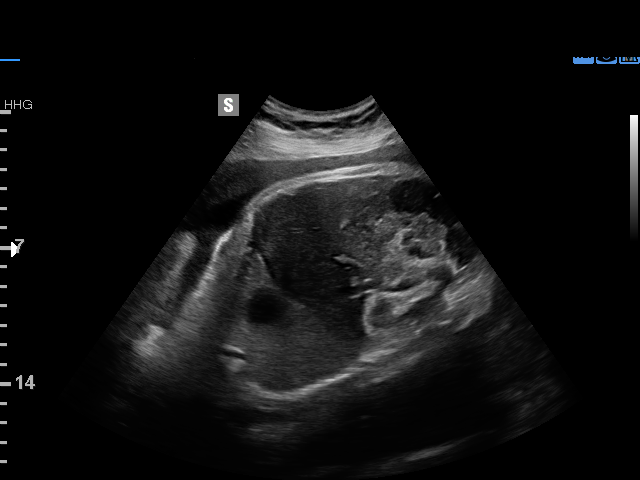
[im 5/15]
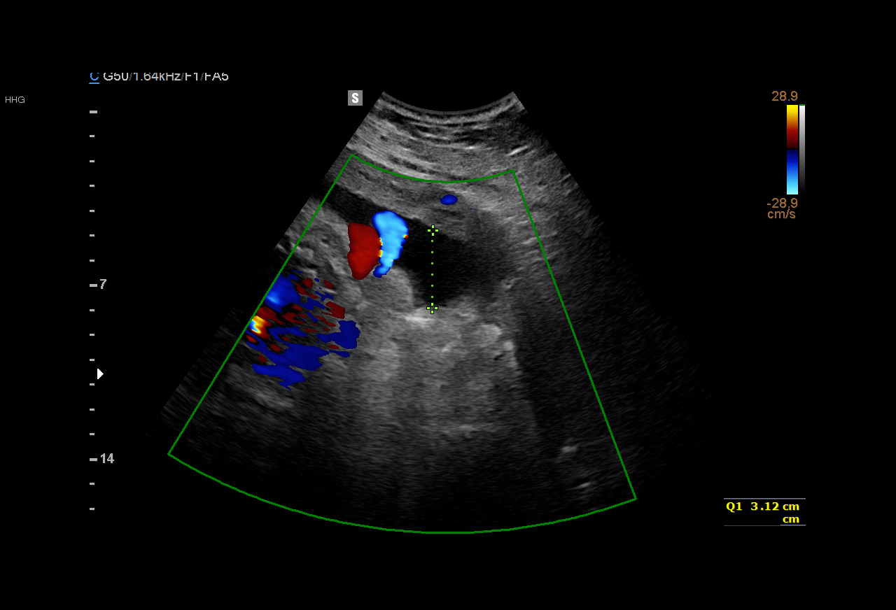
[im 6/15]
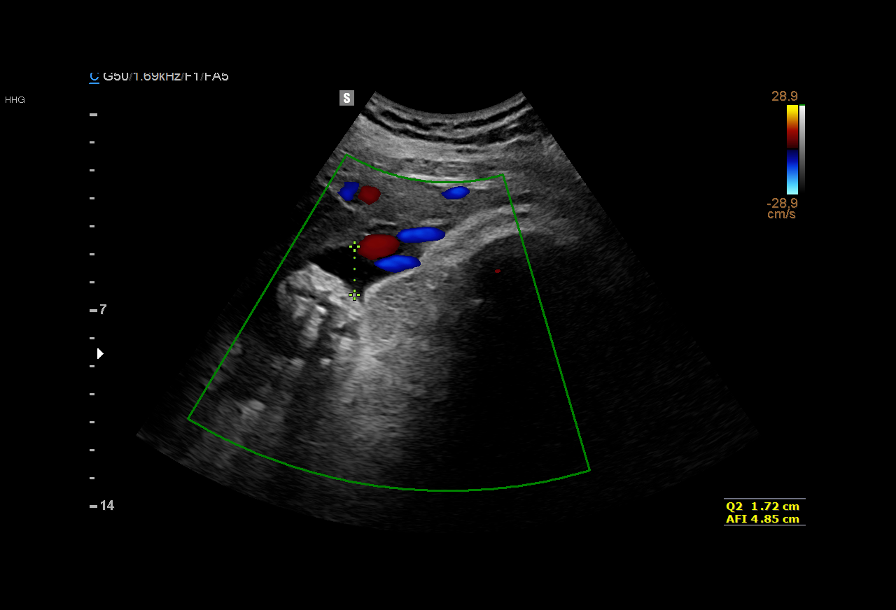
[im 7/15]
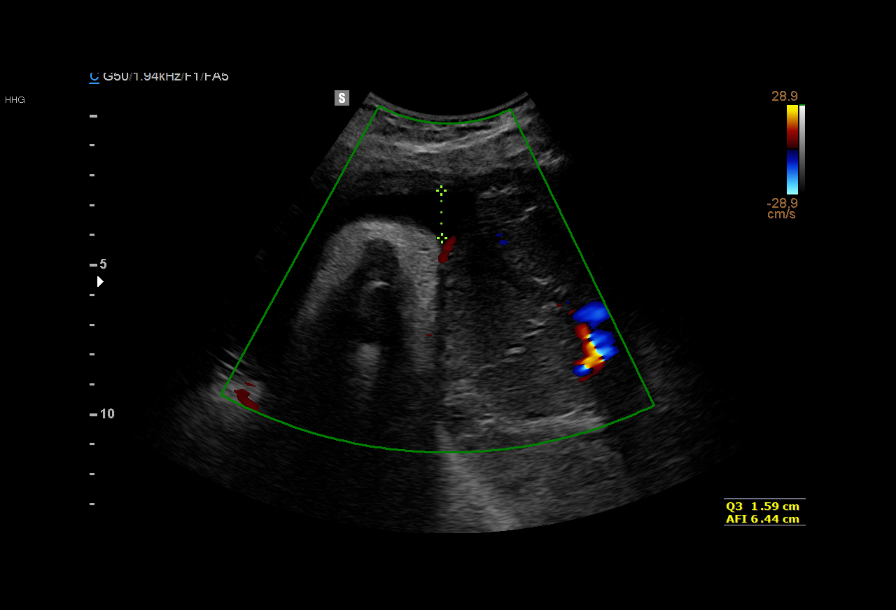
[im 9/15]
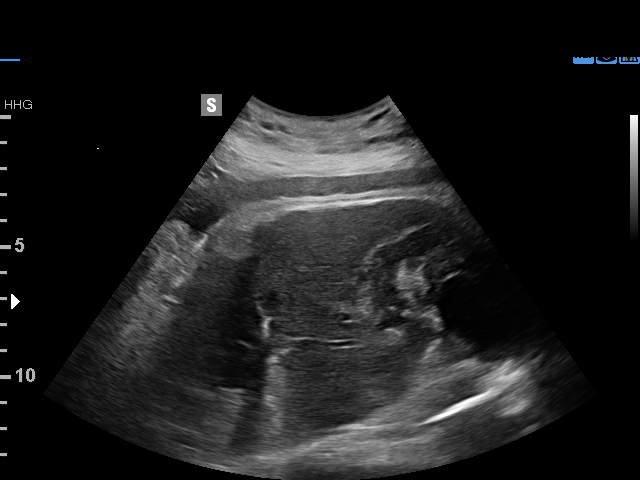
[im 10/15]
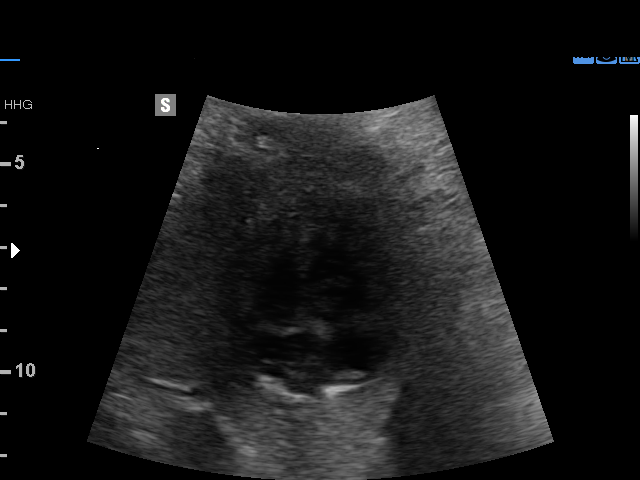
[im 11/15]
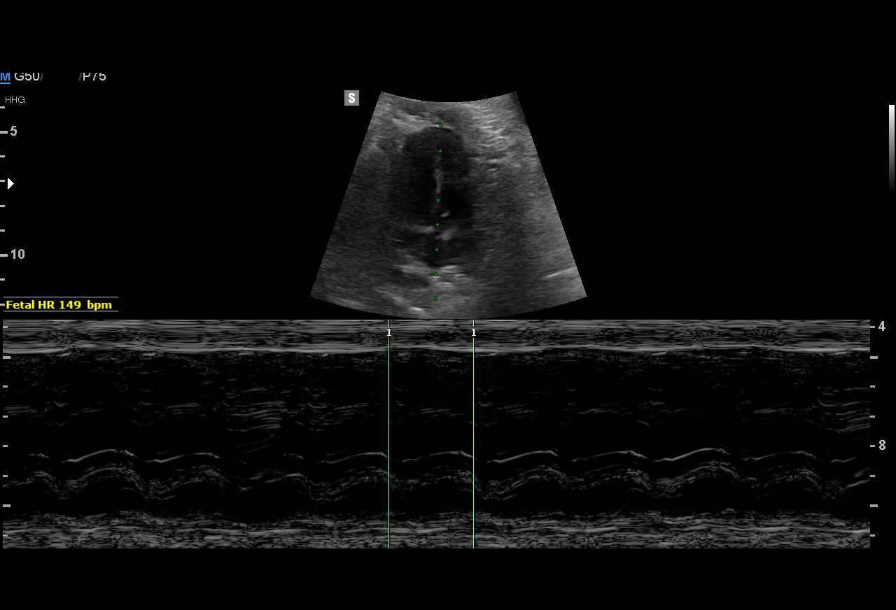
[im 12/15]
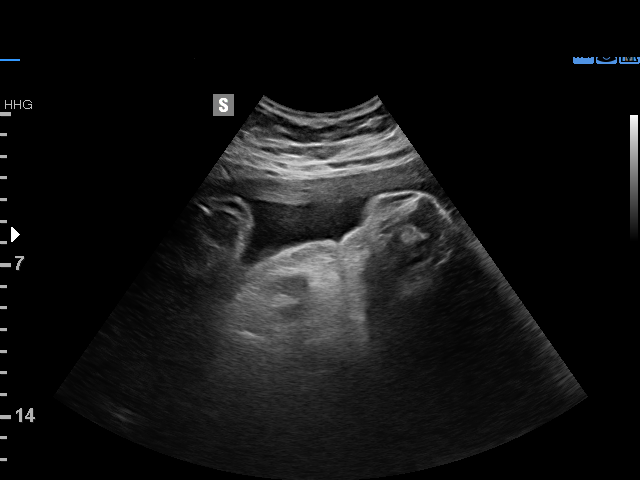
[im 14/15]
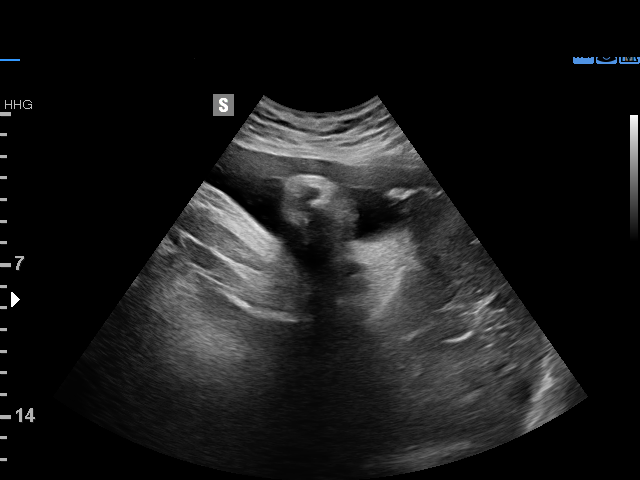
[im 15/15]
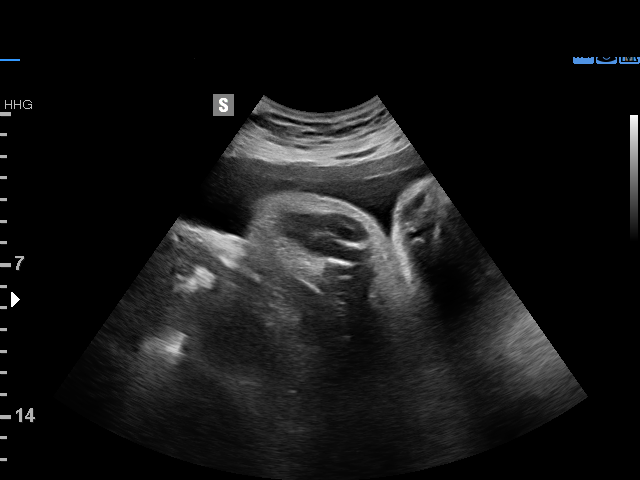

[12 of 15 positions shown; findings below may reference images not displayed]

Attending:        Abimelk Tiger         Secondary Phy.:   L&D Nursing- L/D
[REDACTED]7

1  KINTZIGER DANELUTTI             [id number]      [id number]     [phone]
Indications

36 weeks gestation of pregnancy
Pre-existing diabetes, type 2(metformin), in
pregnancy, third trimester; normal ECHO
Maternal morbid obesity
Poor obstetric history: Previous gestational
diabetes
OB History

Gravidity:    6         Term:   4        Prem:   0        SAB:   1
TOP:          0       Ectopic:  0        Living: 4
Fetal Evaluation

Num Of Fetuses:     1
Fetal Heart         149
Rate(bpm):
Cardiac Activity:   Observed
Presentation:       Cephalic
Placenta:           Anterior, above cervical os

Amniotic Fluid
AFI FV:      Subjectively within normal limits

AFI Sum(cm)     %Tile       Largest Pocket(cm)
9.78            21

RUQ(cm)       RLQ(cm)       LUQ(cm)        LLQ(cm)
3.12

Comment:    BPP [DATE] in 86mins
Biophysical Evaluation

Amniotic F.V:   Pocket => 2 cm two         F. Tone:        Observed
planes
F. Movement:    Observed                   Score:          [DATE]
F. Breathing:   Observed
Gestational Age

Best:          36w 2d    Det. By:   Early Ultrasound         EDD:   01/31/16
(06/30/15)
Impression

Singleton intrauterine pregnancy at 36 weeks 2 days
gestation with fetal cardiac activity
Cephalic presentation
BPP [DATE] with an AFI >9 cm

Recommendations

Continue antenatal testing as scheduled
Otherwise, follow up ultrasounds as clinically indicated

## 2017-09-06 ENCOUNTER — Encounter (HOSPITAL_COMMUNITY): Payer: Self-pay | Admitting: Family Medicine

## 2017-09-06 ENCOUNTER — Ambulatory Visit (HOSPITAL_COMMUNITY)
Admission: EM | Admit: 2017-09-06 | Discharge: 2017-09-06 | Disposition: A | Discharge status: Home / Self Care | Payer: Medicaid Other | Attending: Emergency Medicine | Admitting: Emergency Medicine

## 2017-09-06 DIAGNOSIS — B9689 Other specified bacterial agents as the cause of diseases classified elsewhere: Secondary | ICD-10-CM | POA: Diagnosis not present

## 2017-09-06 DIAGNOSIS — W57XXXA Bitten or stung by nonvenomous insect and other nonvenomous arthropods, initial encounter: Secondary | ICD-10-CM | POA: Diagnosis not present

## 2017-09-06 DIAGNOSIS — Z3202 Encounter for pregnancy test, result negative: Secondary | ICD-10-CM | POA: Diagnosis not present

## 2017-09-06 DIAGNOSIS — N760 Acute vaginitis: Secondary | ICD-10-CM | POA: Diagnosis not present

## 2017-09-06 DIAGNOSIS — N898 Other specified noninflammatory disorders of vagina: Secondary | ICD-10-CM | POA: Diagnosis not present

## 2017-09-06 DIAGNOSIS — R21 Rash and other nonspecific skin eruption: Secondary | ICD-10-CM | POA: Diagnosis present

## 2017-09-06 LAB — POCT URINALYSIS DIP (DEVICE)
Glucose, UA: NEGATIVE mg/dL
KETONES UR: NEGATIVE mg/dL
Leukocytes, UA: NEGATIVE
Nitrite: NEGATIVE
PROTEIN: 30 mg/dL — AB
Specific Gravity, Urine: >=1.03 (ref 1.005–1.030)
Urobilinogen, UA: 1 mg/dL (ref 0.0–1.0)
pH: 5.5 (ref 5.0–8.0)

## 2017-09-06 LAB — POCT PREGNANCY, URINE: PREG TEST UR: NEGATIVE

## 2017-09-06 MED ORDER — METRONIDAZOLE 500 MG PO TABS: 500.0000 mg | ORAL_TABLET | Freq: Two times a day (BID) | ORAL | 0 refills | Status: AC

## 2017-09-06 MED ORDER — TRIAMCINOLONE ACETONIDE 0.1 % EX CREA: 1.0000 "application " | TOPICAL_CREAM | Freq: Two times a day (BID) | CUTANEOUS | 0 refills | Status: DC

## 2017-09-06 MED ORDER — MUPIROCIN 2 % EX OINT: 1.0000 "application " | TOPICAL_OINTMENT | Freq: Three times a day (TID) | CUTANEOUS | 0 refills | Status: DC

## 2017-09-06 MED ORDER — LORATADINE 10 MG PO TABS: 10.0000 mg | ORAL_TABLET | Freq: Every day | ORAL | 0 refills | Status: DC

## 2017-09-06 NOTE — Discharge Instructions (Signed)
Try the Bactroban, which is an antibiotic ointment in the triamcinolone, which is a steroid, which will help with the itching.  Take the Claritin which will also help with the itching.  Check your mattress very carefully for bedbugs.  See attached information.  Finish the Flagyl.  We will call you if we need to change your medications.

## 2017-09-06 NOTE — ED Provider Notes (Signed)
HPI  SUBJECTIVE:  Monique Schmidt is a 32 y.o. female who presents with 2 complaints.  First she reports 2-3 days of a red, itchy, migratory rash on her forearm.  She reports a similar lesion on her buttocks which is nonmigratory.  She states that she feels as if she is being bitten at night, but she denies blood in the bed clothes in the morning.  She states that the itching seems to be worse at night.  It is not painful burning.  No contacts with similar rash.  No travel.  Her husband does not have any symptoms.  She tried rubbing alcohol without improvement in her symptoms.  No other aggravating or alleviating.  No new lotions, soaps, detergents.  She states that the redness seems to fluctuate in size.  She has checked her mattress but has not seen in a box.  Second, she reports that she, odorous urine for the past 2-3 days as well.  She reports urinary frequency, and a fishy vaginal odor.  She denies dysuria, urgency, cloudy urine, hematuria.  No genital rash, itching, discharge.  No pelvic pain.  She is in a long-term monogamous relationship with her husband who is asymptomatic.  STDs are not a concern today.  There are no aggravating or alleviating factors.  She has not tried anything for this.  She states this feels like previous BV infections.  Past medical history of obesity, gestational diabetes, BV, yeast.  No history of MRSA, gonorrhea, chlamydia, HIV, syphilis, Trichomonas.  LMP: Now.  PMD: None.   Past Medical History:  Diagnosis Date  . Abnormal Pap smear   . Gestational diabetes    likely pre-exisiting; all pregnancies glyburide started 11/4  . Gestational thrombocytopenia ([facility])   . Headache(784.0)   . Hernia, umbilical   . History of abnormal cervical Pap smear    normal 2014  . Mild preeclampsia delivered 01/22/2016    Past Surgical History:  Procedure Laterality Date  . UMBILICAL HERNIA REPAIR N/A 11/22/2014   Procedure: LAPAROSCOPIC UMBILICAL HERNIA;  Surgeon: Axel Filler,  MD;  Location: [facility] OR;  Service: General;  Laterality: N/A;  . WISDOM TOOTH EXTRACTION      Family History  Problem Relation Age of Onset  . Hypertension Father   . Diabetes Father   . Diabetes Mother   . Seizures Brother     Social History   Tobacco Use  . Smoking status: Never Smoker  . Smokeless tobacco: Never Used  Substance Use Topics  . Alcohol use: No  . Drug use: No    No current facility-administered medications for this encounter.   Current Outpatient Medications:  .  albuterol (PROVENTIL HFA) 108 (90 Base) MCG/ACT inhaler, Inhale 2 puffs into the lungs every 4 (four) hours as needed for wheezing or shortness of breath. Reported on 02/27/2016, Disp: , Rfl:  .  loratadine (CLARITIN) 10 MG tablet, Take 1 tablet (10 mg total) by mouth daily., Disp: 30 tablet, Rfl: 0 .  metroNIDAZOLE (FLAGYL) 500 MG tablet, Take 1 tablet (500 mg total) by mouth 2 (two) times daily for 7 days., Disp: 14 tablet, Rfl: 0 .  mupirocin ointment (BACTROBAN) 2 %, Apply 1 application topically 3 (three) times daily., Disp: 22 g, Rfl: 0 .  triamcinolone cream (KENALOG) 0.1 %, Apply 1 application topically 2 (two) times daily. Apply for 2 weeks. May use on face, Disp: 30 g, Rfl: 0  Allergies  Allergen Reactions  . Latex Itching and Rash  . Penicillins  Itching and Rash    Has patient had a PCN reaction causing immediate rash, facial/tongue/throat swelling, SOB or lightheadedness with hypotension: Yes Has patient had a PCN reaction causing severe rash involving mucus membranes or skin necrosis: Yes Has patient had a PCN reaction that required hospitalization No Has patient had a PCN reaction occurring within the last 10 years: Yes If all of the above answers are "NO", then may proceed with Cephalosporin use.      ROS  As noted in HPI.   Physical Exam  BP 128/71   Pulse 91   Temp 98.2 F (36.8 C)   Resp 18   SpO2 100%   Constitutional: Well developed, well nourished, no acute  distress Eyes:  EOMI, conjunctiva normal bilaterally HENT: Normocephalic, atraumatic,mucus membranes moist Respiratory: Normal inspiratory effort Cardiovascular: Normal rate GI: nondistended, soft.  No suprapubic or flank tenderness. Back no CVA tenderness skin: Positive area of nontender erythema, mild swelling measuring approximate 6.5 x 7.5 on the left forearm.  Marked this with a marker for reference.  She has several other lesions on her forearm and on her buttock.  These are nontender.  No induration.  No crusting.          Musculoskeletal: no deformities Neurologic: Alert & oriented x 3, no focal neuro deficits Psychiatric: Speech and behavior appropriate   ED Course   Medications - No data to display  Orders Placed This Encounter  Procedures  . POCT urinalysis dip (device)    Standing Status:   Standing    Number of Occurrences:   1  . Pregnancy, urine POC    Standing Status:   Standing    Number of Occurrences:   1    Results for orders placed or performed during the hospital encounter of 09/06/17 (from the past 24 hour(s))  POCT urinalysis dip (device)     Status: Abnormal   Collection Time: 09/06/17  8:17 PM  Result Value Ref Range   Glucose, UA NEGATIVE NEGATIVE mg/dL   Bilirubin Urine SMALL (A) NEGATIVE   Ketones, ur NEGATIVE NEGATIVE mg/dL   Specific Gravity, Urine >=1.030 1.005 - 1.030   Hgb urine dipstick LARGE (A) NEGATIVE   pH 5.5 5.0 - 8.0   Protein, ur 30 (A) NEGATIVE mg/dL   Urobilinogen, UA 1.0 0.0 - 1.0 mg/dL   Nitrite NEGATIVE NEGATIVE   Leukocytes, UA NEGATIVE NEGATIVE  Pregnancy, urine POC     Status: None   Collection Time: 09/06/17  8:22 PM  Result Value Ref Range   Preg Test, Ur NEGATIVE NEGATIVE   No results found.  ED Clinical Impression  Rash  BV (bacterial vaginosis)  Bedbug bite, initial encounter   ED Assessment/Plan   1.  Rash.  Suspect bedbugs.  Will send home with Claritin, Bactroban, triamcinolone.  Advised  patient to check her mattress thoroughly.  2.  Vaginal odor.  Suspect BV.  Will send home with Flagyl 500 mg p.o. twice daily for 7 days, will have patient do a self swab for BV, yeast, trichomonas.  Feel that she is low risk for other STDs so testing for this was not done today.  Hematuria, proteinuria noted, she states that she currently is on her menses.  He has no CVA, suprapubic or flank tenderness.  Doubt UTI.  Will provide primary care referral list and order primary care referral for routine care.  Discussed labs,MDM, plan and followup with patient. Discussed sn/sx that should prompt return to the ED. patient agrees  with plan.   Meds ordered this encounter  Medications  . triamcinolone cream (KENALOG) 0.1 %    Sig: Apply 1 application topically 2 (two) times daily. Apply for 2 weeks. May use on face    Dispense:  30 g    Refill:  0  . mupirocin ointment (BACTROBAN) 2 %    Sig: Apply 1 application topically 3 (three) times daily.    Dispense:  22 g    Refill:  0  . loratadine (CLARITIN) 10 MG tablet    Sig: Take 1 tablet (10 mg total) by mouth daily.    Dispense:  30 tablet    Refill:  0  . metroNIDAZOLE (FLAGYL) 500 MG tablet    Sig: Take 1 tablet (500 mg total) by mouth 2 (two) times daily for 7 days.    Dispense:  14 tablet    Refill:  0    *This clinic note was created using Scientist, clinical (histocompatibility and immunogenetics)Dragon dictation software. Therefore, there may be occasional mistakes despite careful proofreading.   ?   Domenick GongMortenson, Juno Alers, MD 09/06/17 2150

## 2017-09-06 NOTE — ED Triage Notes (Signed)
Pt here for bug bites to left arm and bottom. sts also some lower abd pain. sts also foul odor when urinating. Denies vaginal discharge. sts that she needs a pregnancy test.

## 2017-09-09 LAB — CERVICOVAGINAL ANCILLARY ONLY
BACTERIAL VAGINITIS: POSITIVE — AB
CANDIDA VAGINITIS: NEGATIVE
TRICH (WINDOWPATH): NEGATIVE

## 2017-11-18 ENCOUNTER — Encounter: Payer: Self-pay | Admitting: *Deleted

## 2017-11-22 ENCOUNTER — Telehealth (HOSPITAL_COMMUNITY): Payer: Self-pay | Admitting: Emergency Medicine

## 2017-11-22 ENCOUNTER — Encounter (HOSPITAL_COMMUNITY): Payer: Self-pay | Admitting: Family Medicine

## 2017-11-22 ENCOUNTER — Ambulatory Visit (HOSPITAL_COMMUNITY)
Admission: EM | Admit: 2017-11-22 | Discharge: 2017-11-22 | Disposition: A | Discharge status: Home / Self Care | Payer: Medicaid Other | Attending: Internal Medicine | Admitting: Internal Medicine

## 2017-11-22 DIAGNOSIS — M549 Dorsalgia, unspecified: Secondary | ICD-10-CM | POA: Diagnosis present

## 2017-11-22 DIAGNOSIS — N898 Other specified noninflammatory disorders of vagina: Secondary | ICD-10-CM

## 2017-11-22 DIAGNOSIS — N926 Irregular menstruation, unspecified: Secondary | ICD-10-CM | POA: Diagnosis not present

## 2017-11-22 DIAGNOSIS — B372 Candidiasis of skin and nail: Secondary | ICD-10-CM | POA: Insufficient documentation

## 2017-11-22 DIAGNOSIS — B369 Superficial mycosis, unspecified: Secondary | ICD-10-CM | POA: Diagnosis not present

## 2017-11-22 DIAGNOSIS — Z79899 Other long term (current) drug therapy: Secondary | ICD-10-CM | POA: Diagnosis not present

## 2017-11-22 LAB — POCT PREGNANCY, URINE: PREG TEST UR: NEGATIVE

## 2017-11-22 MED ORDER — NYSTATIN 100000 UNIT/GM EX POWD: Freq: Three times a day (TID) | CUTANEOUS | 0 refills | Status: DC

## 2017-11-22 MED ORDER — FLUCONAZOLE 150 MG PO TABS: 150.0000 mg | ORAL_TABLET | Freq: Every day | ORAL | 0 refills | Status: AC

## 2017-11-22 MED ORDER — METRONIDAZOLE 500 MG PO TABS: 500.0000 mg | ORAL_TABLET | Freq: Two times a day (BID) | ORAL | 0 refills | Status: AC

## 2017-11-22 NOTE — ED Triage Notes (Addendum)
Pt here for vaginal itching and irritation. Denies any urinary symptoms. She is also requesting pregnancy test

## 2017-11-22 NOTE — Discharge Instructions (Addendum)
Recommend start Nystatin powder- apply 3 times a day under both breasts as directed. Try to keep area dry. Recommend start Flagyl 500mg  twice a day as directed. May take Diflucan 150mg  once daily for 3 days- do not start these oral medications until you hear back from us regarding blood pregnancy test. May take Tylenol 1000mg  every 8 hours as needed for pain. Recommend follow-up pending lab results and with [facility] if vaginal symptoms do not improve within 4 to 5 days.

## 2017-11-23 LAB — HCG, QUANTITATIVE, PREGNANCY

## 2017-11-23 NOTE — ED Provider Notes (Signed)
[facility]    CSN: [id number] Arrival date & time: 11/22/17  1037     History   Chief Complaint Chief Complaint  Patient presents with  . Vaginal Discharge  . Back Pain    HPI Monique Schmidt is a 32 y.o. female.   32 year old female presents with unusual vaginal discharge and odor for the past 2 to 3 days. Denies any fever, dysuria, unusual bleeding, vomiting or diarrhea. She has had some nausea and her LMP was in February. She is also having mild lower abdominal pain and thinks "I feel a heart beat" in her lower abdomen and concerned over pregnancy. Requests urine pregnancy test and blood test since a previous pregnancy did not show on urine test until she was 6 months pregnant. Currently sexually active with one partner and does not use condoms or other contraception. Also complains of rash and irritation under both breasts but mainly on right with odor. Started 2-3 days ago as well and has not used any medication for symptoms.   The history is provided by the patient.    Past Medical History:  Diagnosis Date  . Abnormal Pap smear   . Gestational diabetes    likely pre-exisiting; all pregnancies glyburide started 11/4  . Gestational thrombocytopenia (HCC)   . Headache(784.0)   . Hernia, umbilical   . History of abnormal cervical Pap smear    normal 2014  . Mild preeclampsia delivered 01/22/2016    Patient Active Problem List   Diagnosis Date Noted  . BV (bacterial vaginosis) 02/27/2016  . BMI 50.0-59.9, adult (HCC) 11/09/2013  . Moderate persistent asthma 08/07/2013    Past Surgical History:  Procedure Laterality Date  . UMBILICAL HERNIA REPAIR N/A 11/22/2014   Procedure: LAPAROSCOPIC UMBILICAL HERNIA;  Surgeon: Axel Filler, MD;  Location: [facility];  Service: General;  Laterality: N/A;  . WISDOM TOOTH EXTRACTION      OB History    Gravida  6   Para  5   Term  5   Preterm      AB  1   Living  5     SAB  1   TAB      Ectopic      Multiple  0   Live Births  5            Home Medications    Prior to Admission medications   Medication Sig Start Date End Date Taking? Authorizing Provider  albuterol (PROVENTIL HFA) 108 (90 Base) MCG/ACT inhaler Inhale 2 puffs into the lungs every 4 (four) hours as needed for wheezing or shortness of breath. Reported on 02/27/2016 08/03/13   [provider]  fluconazole (DIFLUCAN) 150 MG tablet Take 1 tablet (150 mg total) by mouth daily for 3 days. 11/22/17 11/25/17  Sudie Grumbling, NP  loratadine (CLARITIN) 10 MG tablet Take 1 tablet (10 mg total) by mouth daily. 09/06/17   Domenick Gong, MD  metroNIDAZOLE (FLAGYL) 500 MG tablet Take 1 tablet (500 mg total) by mouth 2 (two) times daily for 7 days. 11/22/17 11/29/17  Sudie Grumbling, NP  nystatin (MYCOSTATIN/NYSTOP) powder Apply topically 3 (three) times daily. Under both breasts 11/22/17   Sudie Grumbling, NP  triamcinolone cream (KENALOG) 0.1 % Apply 1 application topically 2 (two) times daily. Apply for 2 weeks. May use on face 09/06/17   Domenick Gong, MD    Family History Family History  Problem Relation Age of Onset  . Hypertension Father   .  Diabetes Father   . Diabetes Mother   . Seizures Brother     Social History Social History   Tobacco Use  . Smoking status: Never Smoker  . Smokeless tobacco: Never Used  Substance Use Topics  . Alcohol use: No  . Drug use: No     Allergies   Latex and Penicillins   Review of Systems Review of Systems  Constitutional: Positive for appetite change and fatigue. Negative for activity change, chills and fever.  HENT: Negative for congestion, mouth sores and sore throat.   Respiratory: Negative for cough, chest tightness, shortness of breath and wheezing.   Gastrointestinal: Positive for abdominal pain and nausea. Negative for diarrhea and vomiting.  Genitourinary: Positive for menstrual problem and vaginal discharge. Negative for difficulty urinating, dysuria,  flank pain, frequency, genital sores, hematuria, urgency, vaginal bleeding and vaginal pain.  Musculoskeletal: Positive for arthralgias and back pain. Negative for myalgias, neck pain and neck stiffness.  Skin: Positive for rash. Negative for wound.  Neurological: Negative for dizziness, tremors, seizures, syncope, weakness, light-headedness and headaches.  Hematological: Negative for adenopathy. Does not bruise/bleed easily.     Physical Exam Triage Vital Signs ED Triage Vitals  Enc Vitals Group     BP 11/22/17 1146 (!) 145/96     Pulse Rate 11/22/17 1146 83     Resp 11/22/17 1146 18     Temp 11/22/17 1146 98 F (36.7 C)     Temp src --      SpO2 11/22/17 1146 100 %     Weight --      Height --      Head Circumference --      Peak Flow --      Pain Score 11/22/17 1144 4     Pain Loc --      Pain Edu? --      Excl. in GC? --    No data found.  Updated Vital Signs BP (!) 145/96   Pulse 83   Temp 98 F (36.7 C)   Resp 18   LMP 10/10/2017   SpO2 100%   Visual Acuity Right Eye Distance:   Left Eye Distance:   Bilateral Distance:    Right Eye Near:   Left Eye Near:    Bilateral Near:     Physical Exam  Constitutional: She is oriented to person, place, and time. She appears well-developed and well-nourished. No distress.  HENT:  Head: Normocephalic and atraumatic.  Mouth/Throat: Oropharynx is clear and moist.  Eyes: Conjunctivae and EOM are normal.  Neck: Normal range of motion. Neck supple.  Cardiovascular: Normal rate, regular rhythm and normal heart sounds.  No murmur heard. Pulmonary/Chest: Effort normal and breath sounds normal. No respiratory distress. She has no decreased breath sounds. She has no wheezes. She has no rhonchi. She has no rales. Right breast exhibits skin change (beneath breast- rash). Right breast exhibits no tenderness. Left breast exhibits skin change. Left breast exhibits no tenderness. No breast swelling or tenderness.  Red to purple flat  macular rash present mostly under right breast from mid-nipple line to sternal area. No discharge or crusting. Non-tender.     Abdominal: Soft. Bowel sounds are normal. There is generalized tenderness. There is no rigidity, no rebound, no guarding and no CVA tenderness.  Difficult exam due to body habitus  Genitourinary:  Genitourinary Comments: Patient declines pelvic exam today  Neurological: She is alert and oriented to person, place, and time.  Skin: Skin is warm and  dry. Capillary refill takes less than 2 seconds. Rash noted. Rash is macular. There is erythema.  Psychiatric: Her speech is normal. Judgment and thought content normal. Her affect is blunt. She is slowed. Cognition and memory are normal. She is inattentive.     UC Treatments / Results  Labs (all labs ordered are listed, but only abnormal results are displayed) Labs Reviewed  POCT PREGNANCY, URINE  URINE CYTOLOGY ANCILLARY ONLY    EKG None Radiology No results found.  Procedures Procedures (including critical care time)  Medications Ordered in UC Medications - No data to display   Initial Impression / Assessment and Plan / UC Course  I have reviewed the triage vital signs and the nursing notes.  Pertinent labs & imaging results that were available during my care of the patient were reviewed by me and considered in my medical decision making (see chart for details).    Reviewed negative urine pregnancy test with patient- will draw blood HCG. Discussed that due to history of previous BV and similar symptoms, she probably has BV again. Will treat with Flagyl 500mg  twice a day for 7 days. Recommend start Diflucan 150mg  once daily for 3 days- discussed that she should not start these medications until she has been notified of a negative blood test for pregnancy. Discussed that she has a fungal infection under her breasts- may use Nystatin powder 3 times a day as directed. Try to keep area clean and dry. May take  Tylenol 1000mg  every 8 hours as needed for pain. Recommend follow-up pending lab results and [facility] if vaginal symptoms do not improve within 4 to 5 days.    Final Clinical Impressions(s) / UC Diagnoses   Final diagnoses:  Vaginal discharge  Irregular periods  Fungal infection of skin  Candidal skin infection    ED Discharge Orders        Ordered    metroNIDAZOLE (FLAGYL) 500 MG tablet  2 times daily     11/22/17 1252    fluconazole (DIFLUCAN) 150 MG tablet  Daily     11/22/17 1252    nystatin (MYCOSTATIN/NYSTOP) powder  3 times daily     11/22/17 1252       Controlled Substance Prescriptions [place] Controlled Substance Registry consulted? Not Applicable   Addendum: Lab Tech was having a difficult time finding a vein to obtain blood for HCG. Did not know until after patient was discharged that we were unable to obtain blood for labwork (HCG). Uncertain what information was given to patient from nurses and staff regarding repeating blood draw. Recommend suggesting to patient when notifying her of urine ancillary cytology results that she may return here for repeat blood draw or she should follow-up with [facility] for further evaluation.    Sudie Grumbling, NP 11/23/17 [phone]

## 2017-11-25 LAB — URINE CYTOLOGY ANCILLARY ONLY
CHLAMYDIA, DNA PROBE: NEGATIVE
NEISSERIA GONORRHEA: NEGATIVE
TRICH (WINDOWPATH): NEGATIVE

## 2017-11-25 NOTE — Telephone Encounter (Signed)
Pt contacted regarding results from recent visit being within normal range. Answered all questions. 

## 2017-11-26 LAB — URINE CYTOLOGY ANCILLARY ONLY
Bacterial vaginitis: POSITIVE — AB
Candida vaginitis: NEGATIVE

## 2017-11-27 ENCOUNTER — Telehealth (HOSPITAL_COMMUNITY): Payer: Self-pay

## 2017-11-27 NOTE — Telephone Encounter (Signed)
Pt contacted regarding test for gardnerella (bacterial vaginosis) being positive.  Prescription for metronidazole was given at the urgent care visit. Answered all questions.  

## 2018-08-11 ENCOUNTER — Inpatient Hospital Stay (HOSPITAL_COMMUNITY)
Admission: AD | Admit: 2018-08-11 | Discharge: 2018-08-11 | Disposition: A | Discharge status: Home / Self Care | Payer: Medicaid Other | Source: Ambulatory Visit | Attending: Obstetrics & Gynecology | Admitting: Obstetrics & Gynecology

## 2018-08-11 ENCOUNTER — Encounter (HOSPITAL_COMMUNITY): Payer: Self-pay | Admitting: *Deleted

## 2018-08-11 ENCOUNTER — Other Ambulatory Visit: Payer: Self-pay

## 2018-08-11 DIAGNOSIS — N898 Other specified noninflammatory disorders of vagina: Secondary | ICD-10-CM | POA: Diagnosis present

## 2018-08-11 DIAGNOSIS — Z880 Allergy status to penicillin: Secondary | ICD-10-CM | POA: Diagnosis not present

## 2018-08-11 DIAGNOSIS — N760 Acute vaginitis: Secondary | ICD-10-CM | POA: Diagnosis not present

## 2018-08-11 DIAGNOSIS — Z91040 Latex allergy status: Secondary | ICD-10-CM | POA: Insufficient documentation

## 2018-08-11 DIAGNOSIS — Z79899 Other long term (current) drug therapy: Secondary | ICD-10-CM | POA: Diagnosis not present

## 2018-08-11 DIAGNOSIS — R109 Unspecified abdominal pain: Secondary | ICD-10-CM | POA: Diagnosis present

## 2018-08-11 DIAGNOSIS — H9210 Otorrhea, unspecified ear: Secondary | ICD-10-CM | POA: Insufficient documentation

## 2018-08-11 DIAGNOSIS — B9689 Other specified bacterial agents as the cause of diseases classified elsewhere: Secondary | ICD-10-CM | POA: Diagnosis not present

## 2018-08-11 LAB — URINALYSIS, ROUTINE W REFLEX MICROSCOPIC
Bacteria, UA: NONE SEEN
Bilirubin Urine: NEGATIVE
Ketones, ur: NEGATIVE mg/dL
Nitrite: NEGATIVE
PH: 5 (ref 5.0–8.0)
PROTEIN: NEGATIVE mg/dL
Specific Gravity, Urine: 1.027 (ref 1.005–1.030)

## 2018-08-11 LAB — WET PREP, GENITAL
SPERM: NONE SEEN
TRICH WET PREP: NONE SEEN
Yeast Wet Prep HPF POC: NONE SEEN

## 2018-08-11 LAB — POCT PREGNANCY, URINE: Preg Test, Ur: NEGATIVE

## 2018-08-11 MED ORDER — METRONIDAZOLE 500 MG PO TABS: 500.0000 mg | ORAL_TABLET | Freq: Two times a day (BID) | ORAL | 0 refills | Status: AC

## 2018-08-11 MED ORDER — PROMETHAZINE HCL 12.5 MG PO TABS: 12.5000 mg | ORAL_TABLET | Freq: Four times a day (QID) | ORAL | 0 refills | Status: DC | PRN

## 2018-08-11 NOTE — MAU Provider Note (Addendum)
History     CSN: [id number]  Arrival date and time: 08/11/18 0041   First Provider Initiated Contact with Patient 08/11/18 0130      Chief Complaint  Patient presents with  . Abdominal Pain  . Vaginal Discharge   HPI  Ms.  Monique Schmidt is a 32 y.o. year old [id number] non-pregnant female who presents to MAU reporting abdominal pain and "dead" odor to her vaginal d/c x 2 days, a "fishy" smelling fluid coming from her ear, and nausea. Sexually active with 1 partner, uses condoms, last SI was 08/09/18. She has not taken anything for the pain.   Past Medical History:  Diagnosis Date  . Abnormal Pap smear   . Gestational diabetes    likely pre-exisiting; all pregnancies glyburide started 11/4  . Gestational thrombocytopenia (HCC)   . Headache(784.0)   . Hernia, umbilical   . History of abnormal cervical Pap smear    normal 2014  . Mild preeclampsia delivered 01/22/2016    Past Surgical History:  Procedure Laterality Date  . UMBILICAL HERNIA REPAIR N/A 11/22/2014   Procedure: LAPAROSCOPIC UMBILICAL HERNIA;  Surgeon: Axel FillerArmando Ramirez, MD;  Location: [facility];  Service: General;  Laterality: N/A;  . WISDOM TOOTH EXTRACTION      Family History  Problem Relation Age of Onset  . Hypertension Father   . Diabetes Father   . Diabetes Mother   . Seizures Brother     Social History   Tobacco Use  . Smoking status: Never Smoker  . Smokeless tobacco: Never Used  Substance Use Topics  . Alcohol use: No  . Drug use: No    Allergies:  Allergies  Allergen Reactions  . Latex Itching and Rash  . Penicillins Itching and Rash    Has patient had a PCN reaction causing immediate rash, facial/tongue/throat swelling, SOB or lightheadedness with hypotension: Yes Has patient had a PCN reaction causing severe rash involving mucus membranes or skin necrosis: Yes Has patient had a PCN reaction that required hospitalization No Has patient had a PCN reaction occurring within the last 10 years:  Yes If all of the above answers are "NO", then may proceed with Cephalosporin use.     Medications Prior to Admission  Medication Sig Dispense Refill Last Dose  . albuterol (PROVENTIL HFA) 108 (90 Base) MCG/ACT inhaler Inhale 2 puffs into the lungs every 4 (four) hours as needed for wheezing or shortness of breath. Reported on 02/27/2016   Unknown at Unknown time  . loratadine (CLARITIN) 10 MG tablet Take 1 tablet (10 mg total) by mouth daily. 30 tablet 0   . nystatin (MYCOSTATIN/NYSTOP) powder Apply topically 3 (three) times daily. Under both breasts 60 g 0   . triamcinolone cream (KENALOG) 0.1 % Apply 1 application topically 2 (two) times daily. Apply for 2 weeks. May use on face 30 g 0     Review of Systems  Constitutional: Negative.   HENT: Negative.   Eyes: Positive for discharge ("smells fishy").  Respiratory: Negative.   Cardiovascular: Negative.   Gastrointestinal: Positive for abdominal pain and nausea.  Endocrine: Negative.   Genitourinary: Positive for vaginal discharge (white, smells like something died, "makes me want to throw up").  Musculoskeletal: Negative.   Skin: Negative.   Allergic/Immunologic: Negative.   Neurological: Negative.   Hematological: Negative.   Psychiatric/Behavioral: Negative.    Physical Exam   Blood pressure 105/72, pulse 99, temperature (!) 97.5 F (36.4 C), temperature source Oral, resp. rate 19, height 5\' 6"  (  1.676 m), weight (!) 141.5 kg, last menstrual period 07/31/2018, unknown if currently breastfeeding.  Physical Exam  Nursing note and vitals reviewed. Constitutional: She is oriented to person, place, and time. She appears well-developed and well-nourished.  HENT:  Head: Normocephalic and atraumatic.  Eyes: Pupils are equal, round, and reactive to light.  Neck: Normal range of motion.  Cardiovascular: Normal rate, regular rhythm, normal heart sounds and intact distal pulses.  Respiratory: Effort normal and breath sounds normal.   GI: Soft. Bowel sounds are normal.  Genitourinary:    Genitourinary Comments: Uterus: non-tender, SE: cervix is smooth, pink, no lesions, small amt of thin, malodorous, white vaginal d/c, scant amt of dark red blood coming from cervical os -- WP, GC/CT done, closed/long/firm, mild CMT, no friability, no adnexal tenderness    Musculoskeletal: Normal range of motion.  Neurological: She is alert and oriented to person, place, and time.  Skin: Skin is warm and dry.  Psychiatric: She has a normal mood and affect. Her behavior is normal. Judgment and thought content normal.    MAU Course  Procedures  MDM CCUA UPT Wet Prep GC/CT -- pending  Results for orders placed or performed during the hospital encounter of 08/11/18 (from the past 24 hour(s))  Urinalysis, Routine w reflex microscopic     Status: Abnormal   Collection Time: 08/11/18 12:49 AM  Result Value Ref Range   Color, Urine YELLOW YELLOW   APPearance CLEAR CLEAR   Specific Gravity, Urine 1.027 1.005 - 1.030   pH 5.0 5.0 - 8.0   Glucose, UA >=500 (A) NEGATIVE mg/dL   Hgb urine dipstick SMALL (A) NEGATIVE   Bilirubin Urine NEGATIVE NEGATIVE   Ketones, ur NEGATIVE NEGATIVE mg/dL   Protein, ur NEGATIVE NEGATIVE mg/dL   Nitrite NEGATIVE NEGATIVE   Leukocytes, UA SMALL (A) NEGATIVE   RBC / HPF 0-5 0 - 5 RBC/hpf   WBC, UA 21-50 0 - 5 WBC/hpf   Bacteria, UA NONE SEEN NONE SEEN   Squamous Epithelial / LPF 0-5 0 - 5   Mucus PRESENT   Pregnancy, urine POC     Status: None   Collection Time: 08/11/18 12:58 AM  Result Value Ref Range   Preg Test, Ur NEGATIVE NEGATIVE  Wet prep, genital     Status: Abnormal   Collection Time: 08/11/18  1:33 AM  Result Value Ref Range   Yeast Wet Prep HPF POC NONE SEEN NONE SEEN   Trich, Wet Prep NONE SEEN NONE SEEN   Clue Cells Wet Prep HPF POC PRESENT (A) NONE SEEN   WBC, Wet Prep HPF POC MODERATE (A) NONE SEEN   Sperm NONE SEEN      Assessment and Plan  BV (bacterial vaginosis) - Plan:  Discharge patient - Rx for Flagyl 500 mg BID x 7 days - Information provided on BV - Rx for Phenergan 25 mg po every 6 hrs prn nausea - Discharge home  - Call [facility] to make appt for F/U prn - Patient verbalized an understanding of the plan of care and agrees.   Raelyn Moraolitta Juanda Luba, MSN, CNM 08/11/2018, 2:02 AM

## 2018-08-11 NOTE — Discharge Instructions (Signed)
You can seek care at Urgent Care for your ear problems.   On October 12, 2018, [facility]. At that time, the MAU (Maternity Admissions Unit), where you are being seen today, will no longer take care of non-pregnant patients. We strongly encourage you to find a doctor's office before that time, so that you can be seen with any GYN concerns, like vaginal discharge, urinary tract infection, etc.. in a timely manner.  In order to make an office visit more convenient, the Center for [facility] will be offering evening hours with same-day appointments, walk-in appointments and scheduled appointments available during this time.  Center for [facility] Hours: Monday - 8am - 7:30 pm with walk-in between 4pm- 7:30 pm Tuesday - 8 am - 5 pm open late and accepting walk-ins from 4pm - 7:30pm Wednesday - 8 am - 5 pm open late and accepting walk-ins from 4pm - 7:30pm Thursday 8 am - 5 pmopen late and accepting walk-ins from 4pm - 7:30pm Friday 8 am - 12 pm  For an appointment please call the Center for [facility] @ [facility] at [phone]  For urgent needs, [facility] is also available for management of urgent GYN complaints such as vaginal discharge or urinary tract infections.

## 2018-08-11 NOTE — MAU Note (Signed)
Pt presents to MAU c/o abdominal pain and vaginal discharge. Pt states she had abdominal pain x2days the pain is in her lower abdomen that is cramping 8/10 and RUQ pain that is sharp that is a 7/10. No vaginal bleeding. Pt has a white vaginal discharge that has a "dead" odor. Pt also reports fluid coming from her ear that is a"fishy" smell. Pt feels nauseated.

## 2018-08-12 ENCOUNTER — Ambulatory Visit (HOSPITAL_COMMUNITY)
Admission: EM | Admit: 2018-08-12 | Discharge: 2018-08-12 | Disposition: A | Discharge status: Home / Self Care | Payer: Medicaid Other | Attending: Family Medicine | Admitting: Family Medicine

## 2018-08-12 ENCOUNTER — Encounter (HOSPITAL_COMMUNITY): Payer: Self-pay | Admitting: *Deleted

## 2018-08-12 DIAGNOSIS — H65112 Acute and subacute allergic otitis media (mucoid) (sanguinous) (serous), left ear: Secondary | ICD-10-CM | POA: Diagnosis not present

## 2018-08-12 DIAGNOSIS — H60312 Diffuse otitis externa, left ear: Secondary | ICD-10-CM | POA: Insufficient documentation

## 2018-08-12 HISTORY — DX: Other specified bacterial agents as the cause of diseases classified elsewhere: B96.89

## 2018-08-12 HISTORY — DX: Acute vaginitis: N76.0

## 2018-08-12 LAB — GC/CHLAMYDIA PROBE AMP (~~LOC~~) NOT AT ARMC
Chlamydia: NEGATIVE
NEISSERIA GONORRHEA: POSITIVE — AB

## 2018-08-12 MED ORDER — KETOROLAC TROMETHAMINE 60 MG/2ML IM SOLN
INTRAMUSCULAR | Status: AC
  Filled 2018-08-12: qty 2

## 2018-08-12 MED ORDER — IBUPROFEN 800 MG PO TABS: 800.0000 mg | ORAL_TABLET | Freq: Three times a day (TID) | ORAL | 0 refills | Status: DC

## 2018-08-12 MED ORDER — KETOROLAC TROMETHAMINE 60 MG/2ML IM SOLN
60.0000 mg | Freq: Once | INTRAMUSCULAR | Status: AC
  Administered 2018-08-12: 60 mg via INTRAMUSCULAR

## 2018-08-12 MED ORDER — NEOMYCIN-POLYMYXIN-HC 3.5-10000-1 OT SUSP: 3.0000 [drp] | Freq: Three times a day (TID) | OTIC | 0 refills | Status: AC

## 2018-08-12 MED ORDER — CEFDINIR 300 MG PO CAPS: 300.0000 mg | ORAL_CAPSULE | Freq: Two times a day (BID) | ORAL | 0 refills | Status: AC

## 2018-08-12 NOTE — ED Triage Notes (Signed)
C/O left earache x 2 days with feeling feverish.

## 2018-08-12 NOTE — ED Provider Notes (Signed)
[facility]    CSN: [id number] Arrival date & time: 08/12/18  1628     [id number]   Chief Complaint Chief Complaint  Patient presents with  . Otalgia    HPI Monique Schmidt is a 32 y.o. female no contributing past medical history presenting today for evaluation of ear pain.  Patient states that she has had decreased hearing, drainage and ear pain in her left ear.  Symptoms have been going on for approximately 2 days.  She is also had pain radiating into her jaw.  Has had some mild chest discomfort with cough.  Mild rhinorrhea.  Denies fevers. Not Taking anything for the pain.  Her main complaint is the pain and discomfort that is caused by her symptoms.  HPI  Past Medical History:  Diagnosis Date  . Abnormal Pap smear   . Bacterial vaginosis   . Gestational diabetes    likely pre-exisiting; all pregnancies glyburide started 11/4  . Gestational thrombocytopenia (HCC)   . Headache(784.0)   . Hernia, umbilical   . History of abnormal cervical Pap smear    normal 2014  . Mild preeclampsia delivered 01/22/2016    Patient Active Problem List   Diagnosis Date Noted  . BV (bacterial vaginosis) 02/27/2016  . Bacterial vaginosis 09/20/2015  . BMI 50.0-59.9, adult (HCC) 11/09/2013  . Moderate persistent asthma 08/07/2013    Past Surgical History:  Procedure Laterality Date  . UMBILICAL HERNIA REPAIR N/A 11/22/2014   Procedure: LAPAROSCOPIC UMBILICAL HERNIA;  Surgeon: Axel FillerArmando Ramirez, MD;  Location: [facility];  Service: General;  Laterality: N/A;  . WISDOM TOOTH EXTRACTION      OB History    Gravida  6   Para  5   Term  5   Preterm      AB  1   Living  5     SAB  1   TAB      Ectopic      Multiple  0   Live Births  5            Home Medications    Prior to Admission medications   Medication Sig Start Date End Date Taking? Authorizing Provider  metroNIDAZOLE (FLAGYL) 500 MG tablet Take 1 tablet (500 mg total) by mouth 2 (two) times daily for 7  days. 08/11/18 08/18/18 Yes Raelyn Moraawson, [place], CNM  albuterol (PROVENTIL HFA) 108 (90 Base) MCG/ACT inhaler Inhale 2 puffs into the lungs every 4 (four) hours as needed for wheezing or shortness of breath. Reported on 02/27/2016 08/03/13   [provider]  cefdinir (OMNICEF) 300 MG capsule Take 1 capsule (300 mg total) by mouth 2 (two) times daily for 7 days. 08/12/18 08/19/18  Wieters, Hallie C, PA-C  ibuprofen (ADVIL,MOTRIN) 800 MG tablet Take 1 tablet (800 mg total) by mouth 3 (three) times daily. 08/12/18   Wieters, Hallie C, PA-C  neomycin-polymyxin-hydrocortisone (CORTISPORIN) 3.5-10000-1 OTIC suspension Place 3 drops into the left ear 3 (three) times daily for 7 days. 08/12/18 08/19/18  Wieters, Junius CreamerHallie C, PA-C    Family History Family History  Problem Relation Age of Onset  . Hypertension Father   . Diabetes Father   . Diabetes Mother   . Seizures Brother     Social History Social History   Tobacco Use  . Smoking status: Never Smoker  . Smokeless tobacco: Never Used  Substance Use Topics  . Alcohol use: No  . Drug use: No     Allergies   Latex and  Penicillins   Review of Systems Review of Systems  Constitutional: Negative for activity change, appetite change, chills, fatigue and fever.  HENT: Positive for congestion, ear discharge, ear pain and rhinorrhea. Negative for sinus pressure, sore throat and trouble swallowing.   Eyes: Negative for discharge and redness.  Respiratory: Positive for cough. Negative for chest tightness and shortness of breath.   Cardiovascular: Negative for chest pain.  Gastrointestinal: Negative for abdominal pain, diarrhea, nausea and vomiting.  Musculoskeletal: Negative for myalgias.  Skin: Negative for rash.  Neurological: Negative for dizziness, light-headedness and headaches.     Physical Exam Triage Vital Signs ED Triage Vitals  Enc Vitals Group     BP 08/12/18 1652 (!) 139/96     Pulse Rate 08/12/18 1652 (!) 105     Resp  08/12/18 1652 18     Temp 08/12/18 1652 98.8 F (37.1 C)     Temp Source 08/12/18 1652 Oral     SpO2 08/12/18 1652 100 %     Weight --      Height --      Head Circumference --      Peak Flow --      Pain Score 08/12/18 1653 10     Pain Loc --      Pain Edu? --      Excl. in GC? --    No data found.  Updated Vital Signs BP (!) 139/96   Pulse (!) 105   Temp 98.8 F (37.1 C) (Oral)   Resp 18   LMP 07/31/2018 (Exact Date)   SpO2 100%   Breastfeeding No   Visual Acuity Right Eye Distance:   Left Eye Distance:   Bilateral Distance:    Right Eye Near:   Left Eye Near:    Bilateral Near:     Physical Exam Vitals signs and nursing note reviewed.  Constitutional:      General: She is not in acute distress.    Appearance: She is well-developed.  HENT:     Head: Normocephalic and atraumatic.     Right Ear: Tympanic membrane and ear canal normal.     Ears:     Comments: Left left tragus tender to palpation, left EAC swollen with debris present through canal, TM partially visualized and appears to have significant amount of mucus behind TM.    Nose:     Comments: Left nasal turbinates swollen with rhinorrhea present    Mouth/Throat:     Comments: Oral mucosa pink and moist, no tonsillar enlargement or exudate. Posterior pharynx patent and nonerythematous, no uvula deviation or swelling. Normal phonation. Eyes:     Conjunctiva/sclera: Conjunctivae normal.  Neck:     Musculoskeletal: Neck supple.     Comments: Full active range of motion of neck Cardiovascular:     Rate and Rhythm: Normal rate and regular rhythm.     Heart sounds: No murmur.  Pulmonary:     Effort: Pulmonary effort is normal. No respiratory distress.     Breath sounds: Normal breath sounds.     Comments: Breathing comfortably at rest, CTABL, no wheezing, rales or other adventitious sounds auscultated Abdominal:     Palpations: Abdomen is soft.     Tenderness: There is no abdominal tenderness.  Skin:     General: Skin is warm and dry.  Neurological:     Mental Status: She is alert.      UC Treatments / Results  Labs (all labs ordered are listed, but only abnormal  results are displayed) Labs Reviewed - No data to display  EKG None  Radiology No results found.  Procedures Procedures (including critical care time)  Medications Ordered in UC Medications  ketorolac (TORADOL) injection 60 mg (60 mg Intramuscular Given 08/12/18 1713)    Initial Impression / Assessment and Plan / UC Course  I have reviewed the triage vital signs and the nursing notes.  Pertinent labs & imaging results that were available during my care of the patient were reviewed by me and considered in my medical decision making (see chart for details).     Patient with allergies to penicillins.  Will treat for otitis media and otitis externa.  Will provide Omnicef and Cortisporin.  TM does not appear ruptured.  Toradol prior to discharge to help with pain.  Ibuprofen and Tylenol for further pain management.  Continue to monitor pain, hearing, follow-up if symptoms not resolving or worsening.Discussed strict return precautions. Patient verbalized understanding and is agreeable with plan.  Final Clinical Impressions(s) / UC Diagnoses   Final diagnoses:  Acute diffuse otitis externa of left ear  Acute mucoid otitis media of left ear     Discharge Instructions     Please begin taking Omnicef twice daily for the next week Please use eardrops 3 drops every 8 hours into the left ear Use anti-inflammatories for pain/swelling. You may take up to 800 mg Ibuprofen every 8 hours with food. You may supplement Ibuprofen with Tylenol [phone] mg every 8 hours.   Please follow-up if symptoms not resolving with the above treatment, worsening, developing worsening pain, swelling or fevers, decreased hearing   ED Prescriptions    Medication Sig Dispense Auth. Provider   cefdinir (OMNICEF) 300 MG capsule Take 1 capsule  (300 mg total) by mouth 2 (two) times daily for 7 days. 14 capsule Wieters, Hallie C, PA-C   neomycin-polymyxin-hydrocortisone (CORTISPORIN) 3.5-10000-1 OTIC suspension Place 3 drops into the left ear 3 (three) times daily for 7 days. 10 mL Wieters, Hallie C, PA-C   ibuprofen (ADVIL,MOTRIN) 800 MG tablet Take 1 tablet (800 mg total) by mouth 3 (three) times daily. 21 tablet Wieters, [place] C, PA-C     Controlled Substance Prescriptions [place] Controlled Substance Registry consulted? Not Applicable   Lew Dawes, [place] 08/12/18 1746

## 2018-08-12 NOTE — Discharge Instructions (Signed)
Please begin taking Omnicef twice daily for the next week Please use eardrops 3 drops every 8 hours into the left ear Use anti-inflammatories for pain/swelling. You may take up to 800 mg Ibuprofen every 8 hours with food. You may supplement Ibuprofen with Tylenol [phone] mg every 8 hours.   Please follow-up if symptoms not resolving with the above treatment, worsening, developing worsening pain, swelling or fevers, decreased hearing

## 2018-08-16 ENCOUNTER — Encounter (HOSPITAL_COMMUNITY): Payer: Self-pay

## 2018-08-16 ENCOUNTER — Other Ambulatory Visit: Payer: Self-pay

## 2018-08-16 ENCOUNTER — Ambulatory Visit (HOSPITAL_COMMUNITY)
Admission: EM | Admit: 2018-08-16 | Discharge: 2018-08-16 | Disposition: A | Discharge status: Home / Self Care | Payer: Medicaid Other | Attending: Family Medicine | Admitting: Family Medicine

## 2018-08-16 DIAGNOSIS — H60392 Other infective otitis externa, left ear: Secondary | ICD-10-CM | POA: Diagnosis not present

## 2018-08-16 DIAGNOSIS — A5429 Other gonococcal genitourinary infections: Secondary | ICD-10-CM | POA: Diagnosis not present

## 2018-08-16 DIAGNOSIS — A549 Gonococcal infection, unspecified: Secondary | ICD-10-CM | POA: Insufficient documentation

## 2018-08-16 MED ORDER — CEFTRIAXONE SODIUM 250 MG IJ SOLR
INTRAMUSCULAR | Status: AC
  Filled 2018-08-16: qty 250

## 2018-08-16 MED ORDER — CEFTRIAXONE SODIUM 250 MG IJ SOLR
250.0000 mg | Freq: Once | INTRAMUSCULAR | Status: AC
  Administered 2018-08-16: 250 mg via INTRAMUSCULAR

## 2018-08-16 NOTE — ED Triage Notes (Signed)
Pt cc left ear discomfort and abdominal pain x 3 days. The meds for her ear is not working.

## 2018-08-16 NOTE — ED Provider Notes (Signed)
[facility]   [id number] 08/16/18 Arrival Time: 1205  ASSESSMENT & PLAN:  1. Other infective acute otitis externa of left ear   2. Gonorrhea     Meds ordered this encounter  Medications  . cefTRIAXone (ROCEPHIN) injection 250 mg     Discharge Instructions     You have been given the following medications today for treatment of gonorrhea:  cefTRIAXone (ROCEPHIN) injection 250 mg  Please refrain from all sexual activity for at least the next seven days.     Follow-up Information    Schedule an appointment as soon as possible for a visit  [facility].   Why:  Especially if your ear discomfort does not improve over the next several days. Contact information: [address] [place] [address] [phone]          OTC symptom care as needed. Ensure adequate fluid intake and rest. May f/u with PCP or here as needed.  Reviewed expectations re: course of current medical issues. Questions answered. Outlined signs and symptoms indicating need for more acute intervention. Patient verbalized understanding. After Visit Summary given.   SUBJECTIVE: History from: patient.  Monique SprinklesLatasha N Schmidt is a 32 y.o. female who presents with complaint of continued left otalgia. No drainage now. No bleeding. Using prescribed medications as directed. No worsening. Hearing still muffled. Afebrile. Onset gradual, over the past week. Recent cold symptoms: none. Overall normal PO intake without n/v. Sick contacts: no. OTC treatment: none.  Social History   Tobacco Use  Smoking Status Never Smoker  Smokeless Tobacco Never Used   Also reports she [facility]. Received a call that her gonorrhea test was positive. Requests treatment. No significant abd/pelvic pain reported today. No urinary symptoms.  ROS: As per HPI. All other systems negative.    OBJECTIVE:  Vitals:   08/16/18 1326  08/16/18 1328  BP:  (!) 129/94  Pulse:  84  Resp:  18  Temp:  98.2 F (36.8 C)  SpO2:  98%  Weight: 129.3 kg      General appearance: alert; appears fatigued Ear Canal: edema and inflammation on the left; pain with exam TM: difficult to see TM on the left but, from area I can see, it does appear inflammed Neck: supple without LAD Lungs: unlabored respirations, symmetrical air entry; cough: absent; no respiratory distress Abd: obese; soft; non-tender Pelvic: exam deferred Skin: warm and dry Psychological: alert and cooperative; normal mood and affect  Allergies  Allergen Reactions  . Latex Itching and Rash  . Penicillins Itching and Rash    Has patient had a PCN reaction causing immediate rash, facial/tongue/throat swelling, SOB or lightheadedness with hypotension: Yes Has patient had a PCN reaction causing severe rash involving mucus membranes or skin necrosis: Yes Has patient had a PCN reaction that required hospitalization No Has patient had a PCN reaction occurring within the last 10 years: Yes If all of the above answers are "NO", then may proceed with Cephalosporin use.     Past Medical History:  Diagnosis Date  . Abnormal Pap smear   . Bacterial vaginosis   . Gestational diabetes    likely pre-exisiting; all pregnancies glyburide started 11/4  . Gestational thrombocytopenia (HCC)   . Headache(784.0)   . Hernia, umbilical   . History of abnormal cervical Pap smear    normal 2014  . Mild preeclampsia delivered 01/22/2016   Family History  Problem Relation Age  of Onset  . Hypertension Father   . Diabetes Father   . Diabetes Mother   . Seizures Brother    Social History   Socioeconomic History  . Marital status: Single    Spouse name: Not on file  . Number of children: Not on file  . Years of education: Not on file  . Highest education level: Not on file  Occupational History  . Not on file  Social Needs  . Financial resource strain: Not on file  . Food  insecurity:    Worry: Not on file    Inability: Not on file  . Transportation needs:    Medical: Not on file    Non-medical: Not on file  Tobacco Use  . Smoking status: Never Smoker  . Smokeless tobacco: Never Used  Substance and Sexual Activity  . Alcohol use: No  . Drug use: No  . Sexual activity: Not on file  Lifestyle  . Physical activity:    Days per week: Not on file    Minutes per session: Not on file  . Stress: Not on file  Relationships  . Social connections:    Talks on phone: Not on file    Gets together: Not on file    Attends religious service: Not on file    Active member of club or organization: Not on file    Attends meetings of clubs or organizations: Not on file    Relationship status: Not on file  . Intimate partner violence:    Fear of current or ex partner: Not on file    Emotionally abused: Not on file    Physically abused: Not on file    Forced sexual activity: Not on file  Other Topics Concern  . Not on file  Social History Narrative  . Not on file            Mardella LaymanHagler, Selassie Spatafore, MD 08/16/18 [phone]

## 2018-08-16 NOTE — Discharge Instructions (Addendum)
You have been given the following medications today for treatment of gonorrhea. ° °cefTRIAXone (ROCEPHIN) injection 250 mg ° °Please refrain from all sexual activity for at least the next seven days. ° °

## 2018-08-25 ENCOUNTER — Ambulatory Visit (HOSPITAL_COMMUNITY)
Admission: EM | Admit: 2018-08-25 | Discharge: 2018-08-25 | Disposition: A | Discharge status: Home / Self Care | Payer: Medicaid Other | Attending: Internal Medicine | Admitting: Internal Medicine

## 2018-08-25 ENCOUNTER — Encounter (HOSPITAL_COMMUNITY): Payer: Self-pay | Admitting: Emergency Medicine

## 2018-08-25 DIAGNOSIS — N898 Other specified noninflammatory disorders of vagina: Secondary | ICD-10-CM

## 2018-08-25 DIAGNOSIS — M62830 Muscle spasm of back: Secondary | ICD-10-CM | POA: Insufficient documentation

## 2018-08-25 DIAGNOSIS — L239 Allergic contact dermatitis, unspecified cause: Secondary | ICD-10-CM | POA: Diagnosis present

## 2018-08-25 LAB — POCT URINALYSIS DIP (DEVICE)
Bilirubin Urine: NEGATIVE
Glucose, UA: 100 mg/dL — AB
Ketones, ur: NEGATIVE mg/dL
Leukocytes, UA: NEGATIVE
NITRITE: NEGATIVE
Protein, ur: NEGATIVE mg/dL
Specific Gravity, Urine: 1.025 (ref 1.005–1.030)
Urobilinogen, UA: 0.2 mg/dL (ref 0.0–1.0)
pH: 5.5 (ref 5.0–8.0)

## 2018-08-25 LAB — POCT PREGNANCY, URINE: PREG TEST UR: NEGATIVE

## 2018-08-25 MED ORDER — CEFTRIAXONE SODIUM 250 MG IJ SOLR
INTRAMUSCULAR | Status: AC
  Filled 2018-08-25: qty 250

## 2018-08-25 MED ORDER — LIDOCAINE HCL (PF) 1 % IJ SOLN
INTRAMUSCULAR | Status: AC
  Filled 2018-08-25: qty 2

## 2018-08-25 MED ORDER — CEFTRIAXONE SODIUM 250 MG IJ SOLR
250.0000 mg | Freq: Once | INTRAMUSCULAR | Status: AC
  Administered 2018-08-25: 250 mg via INTRAMUSCULAR

## 2018-08-25 NOTE — ED Triage Notes (Signed)
Pt sts lower back pain and sts "thinks she has another bacteria infection"

## 2018-08-25 NOTE — ED Provider Notes (Signed)
[facility]    CSN: [id number] Arrival date & time: 08/25/18  1253     History   Chief Complaint Chief Complaint  Patient presents with  . Back Pain    HPI Monique Schmidt is a 33 y.o. female.   1- Onset of R flank pain 2 days ago, pain is constant. Denies fever and has had dysuria. Denies injuring herself. Pain is provoked with walking and laying down. Has been taking Ibuprofen and has not helped. She does not have history of this type pain.  2- States her vaginal discharge when she was diagnosed with gonorrhea a couple of weeks ago and was better, and after having sex with her partner 2 days ago, her discharge came back. Her partner was treated as well. Denies having a fever.   LMP November. Had abnormal bleeding which was not like her menses last month, and has not had one since. She has symptoms like a fetus is moving in her uterus similar to when she was pregnant in the past, and took 6 months before her test turned positive to confirmed pregnancy. She was seen at [facility] hospital 08/14/18 and had neg urine pregnancy test, and was positive for GC and BV. She received treamtment for both.     Past Medical History:  Diagnosis Date  . Abnormal Pap smear   . Bacterial vaginosis   . Gestational diabetes    likely pre-exisiting; all pregnancies glyburide started 11/4  . Gestational thrombocytopenia (HCC)   . Headache(784.0)   . Hernia, umbilical   . History of abnormal cervical Pap smear    normal 2014  . Mild preeclampsia delivered 01/22/2016    Patient Active Problem List   Diagnosis Date Noted  . BV (bacterial vaginosis) 02/27/2016  . Bacterial vaginosis 09/20/2015  . BMI 50.0-59.9, adult (HCC) 11/09/2013  . Moderate persistent asthma 08/07/2013    Past Surgical History:  Procedure Laterality Date  . UMBILICAL HERNIA REPAIR N/A 11/22/2014   Procedure: LAPAROSCOPIC UMBILICAL HERNIA;  Surgeon: Axel Filler, MD;  Location: [facility];  Service: General;   Laterality: N/A;  . WISDOM TOOTH EXTRACTION      OB History    Gravida  6   Para  5   Term  5   Preterm      AB  1   Living  5     SAB  1   TAB      Ectopic      Multiple  0   Live Births  5            Home Medications    Prior to Admission medications   Medication Sig Start Date End Date Taking? Authorizing Provider  albuterol (PROVENTIL HFA) 108 (90 Base) MCG/ACT inhaler Inhale 2 puffs into the lungs every 4 (four) hours as needed for wheezing or shortness of breath. Reported on 02/27/2016 08/03/13   [provider]  ibuprofen (ADVIL,MOTRIN) 800 MG tablet Take 1 tablet (800 mg total) by mouth 3 (three) times daily. 08/12/18   Wieters, Junius Creamer, PA-C    Family History Family History  Problem Relation Age of Onset  . Hypertension Father   . Diabetes Father   . Diabetes Mother   . Seizures Brother     Social History Social History   Tobacco Use  . Smoking status: Never Smoker  . Smokeless tobacco: Never Used  Substance Use Topics  . Alcohol use: No  . Drug use: No  Allergies   Latex and Penicillins   Review of Systems Review of Systems  Constitutional: Negative for chills, diaphoresis and fever.  Genitourinary: Positive for dysuria, flank pain and vaginal discharge. Negative for frequency, hematuria, menstrual problem, pelvic pain and vaginal bleeding.  Musculoskeletal: Positive for back pain.  Skin: Positive for rash.       Has a rash on her anterior neck x 1 week and denies applying anything new or necklace on this area. Rash is itchy.   Neurological: Positive for headaches. Negative for weakness and numbness.   Physical Exam Triage Vital Signs ED Triage Vitals  Enc Vitals Group     BP 08/25/18 1340 125/87     Pulse Rate 08/25/18 1340 94     Resp 08/25/18 1340 18     Temp 08/25/18 1340 97.9 F (36.6 C)     Temp Source 08/25/18 1340 Temporal     SpO2 08/25/18 1340 100 %     Weight --      Height --      Head  Circumference --      Peak Flow --      Pain Score 08/25/18 1341 7     Pain Loc --      Pain Edu? --      Excl. in GC? --    No data found.  Updated Vital Signs BP 125/87 (BP Location: Right Arm)   Pulse 94   Temp 97.9 F (36.6 C) (Temporal)   Resp 18   LMP 07/31/2018 (Exact Date)   SpO2 100%   Visual Acuity Right Eye Distance:   Left Eye Distance:   Bilateral Distance:    Right Eye Near:   Left Eye Near:    Bilateral Near:     Physical Exam Vitals signs and nursing note reviewed.  Constitutional:      General: She is not in acute distress.    Appearance: She is obese. She is not ill-appearing or diaphoretic.  HENT:     Head: Normocephalic.     Right Ear: External ear normal.     Left Ear: External ear normal.     Nose: Nose normal.  Eyes:     General: No scleral icterus.    Conjunctiva/sclera: Conjunctivae normal.     Pupils: Pupils are equal, round, and reactive to light.  Neck:     Musculoskeletal: Neck supple. No neck rigidity or muscular tenderness.  Cardiovascular:     Rate and Rhythm: Normal rate and regular rhythm.     Heart sounds: No murmur.  Pulmonary:     Effort: Pulmonary effort is normal.     Breath sounds: Normal breath sounds.  Abdominal:     General: Bowel sounds are normal.     Palpations: There is no mass.     Tenderness: There is abdominal tenderness. There is no right CVA tenderness, left CVA tenderness or guarding.     Comments: Has mild tenderness on L mid abd.  Musculoskeletal: Normal range of motion.  Lymphadenopathy:     Cervical: No cervical adenopathy.  Skin:    General: Skin is dry.     Findings: Rash present.     Comments: Fine fine raised rash on her anterior neck only and over clavicle, but nothing posteriorly.   Neurological:     Mental Status: She is alert and oriented to person, place, and time.     Motor: No weakness.     Coordination: Coordination normal.     Gait:  Gait normal.     Deep Tendon Reflexes: Reflexes  normal.  Psychiatric:        Mood and Affect: Mood normal.        Behavior: Behavior normal.        Thought Content: Thought content normal.        Judgment: Judgment normal.    UC Treatments / Results  Labs (all labs ordered are listed, but only abnormal results are displayed) Labs Reviewed  POCT URINALYSIS DIP (DEVICE) - Abnormal; Notable for the following components:      Result Value   Glucose, UA 100 (*)    Hgb urine dipstick TRACE (*)    All other components within normal limits  POC URINE PREG, ED  URINE CYTOLOGY ANCILLARY ONLY    EKG None  Radiology No results found.  Procedures Procedures  Medications Ordered in UC Medications - No data to display  Initial Impression / Assessment and Plan / UC Course  I have reviewed the triage vital signs and the nursing notes. Pertinent labs results that were available during my care of the patient were reviewed by me and considered in my medical decision making (see chart for details). Her neck rash looks like contact dermatitis.  It is possible she contracted GC again, since the vaginal discharge is the same as when she was positive, so I went ahead and treated her with Rocephin 250 mg IM. We will inform her when the rest of her results are back.  Needs to see her GYN for further work up for pregnancy. Pt agrees.  Final Clinical Impressions(s) / UC Diagnoses   Final diagnoses:  Allergic contact dermatitis, unspecified trigger  Muscle spasm of back  Vaginal discharge   Discharge Instructions   None    ED Prescriptions    None     Controlled Substance Prescriptions [place] Controlled Substance Registry consulted?    Garey Ham, PA-C 08/25/18 1528

## 2018-08-25 NOTE — Discharge Instructions (Addendum)
Since you feel like when you are pregmant, I recommend you go see your gynecologist to get an ultrasound and right now is best for you to not take Ibuprofen or a muscle relaxer til you know for sure you are not pregnant.  I will go ahead and treat you for gonorrhea since your symptoms are the same. This medication we have used in pregnancy before.   Use Hydrocortisone cream 1% twice a day on your rash x 7 days.   You may use ice and heat alterantion for 15 min  each 2-4 times a day for your back pain. OK to take Tylenol.  Consider having an IUD placed since you are not ready to be pregnant, if you are not this time.

## 2018-08-26 LAB — URINE CYTOLOGY ANCILLARY ONLY
Chlamydia: NEGATIVE
Neisseria Gonorrhea: NEGATIVE
Trichomonas: NEGATIVE

## 2018-08-28 LAB — URINE CYTOLOGY ANCILLARY ONLY
Bacterial vaginitis: POSITIVE — AB
Candida vaginitis: NEGATIVE

## 2018-08-31 ENCOUNTER — Telehealth (HOSPITAL_COMMUNITY): Payer: Self-pay | Admitting: Emergency Medicine

## 2018-08-31 MED ORDER — METRONIDAZOLE 500 MG PO TABS: 500.0000 mg | ORAL_TABLET | Freq: Two times a day (BID) | ORAL | 0 refills | Status: DC

## 2018-08-31 NOTE — Telephone Encounter (Signed)
Bacterial vaginosis is positive. This was not treated at the urgent care visit.  Flagyl 500 mg BID x 7 days #14 no refills sent to patients pharmacy of choice.    Attempted to reach patient. No answer at this time. Voicemail left.    

## 2018-09-01 ENCOUNTER — Telehealth (HOSPITAL_COMMUNITY): Payer: Self-pay | Admitting: Emergency Medicine

## 2018-09-01 MED ORDER — METRONIDAZOLE 500 MG PO TABS: 500.0000 mg | ORAL_TABLET | Freq: Two times a day (BID) | ORAL | 0 refills | Status: DC

## 2018-09-01 NOTE — Telephone Encounter (Signed)
Bacterial vaginosis is positive. This was not treated at the urgent care visit.  Flagyl 500 mg BID x 7 days #14 no refills sent to patients pharmacy of choice.  Spoke with pt verbalized understanding

## 2018-12-18 ENCOUNTER — Telehealth: Payer: Self-pay | Admitting: Family Medicine

## 2018-12-18 NOTE — Telephone Encounter (Signed)
Can a nurse call this patient, she is scheduled for a Virtual Visit tomorrow, I called the patient back to determine if she need the visit or not, she is having Abd pain, lower back pain and discharge

## 2018-12-19 ENCOUNTER — Other Ambulatory Visit (HOSPITAL_COMMUNITY)
Admission: RE | Admit: 2018-12-19 | Discharge: 2018-12-19 | Disposition: A | Discharge status: Home / Self Care | Payer: Medicaid Other | Source: Ambulatory Visit

## 2018-12-19 ENCOUNTER — Other Ambulatory Visit: Payer: Self-pay

## 2018-12-19 ENCOUNTER — Ambulatory Visit: Payer: Medicaid Other

## 2018-12-19 ENCOUNTER — Ambulatory Visit (INDEPENDENT_AMBULATORY_CARE_PROVIDER_SITE_OTHER): Payer: Medicaid Other | Admitting: *Deleted

## 2018-12-19 VITALS — BP 130/86 | HR 83 | Temp 98.1°F | Ht 66.0 in | Wt 319.3 lb

## 2018-12-19 DIAGNOSIS — R1030 Lower abdominal pain, unspecified: Secondary | ICD-10-CM

## 2018-12-19 DIAGNOSIS — N898 Other specified noninflammatory disorders of vagina: Secondary | ICD-10-CM | POA: Insufficient documentation

## 2018-12-19 DIAGNOSIS — Z3202 Encounter for pregnancy test, result negative: Secondary | ICD-10-CM

## 2018-12-19 LAB — POCT URINALYSIS DIP (DEVICE)
Bilirubin Urine: NEGATIVE
Glucose, UA: 500 mg/dL — AB
Hgb urine dipstick: NEGATIVE
Ketones, ur: NEGATIVE mg/dL
Nitrite: NEGATIVE
Protein, ur: NEGATIVE mg/dL
Specific Gravity, Urine: 1.025 (ref 1.005–1.030)
Urobilinogen, UA: 0.2 mg/dL (ref 0.0–1.0)
pH: 6 (ref 5.0–8.0)

## 2018-12-19 LAB — POCT PREGNANCY, URINE: Preg Test, Ur: NEGATIVE

## 2018-12-19 NOTE — Progress Notes (Signed)
Pt presents today for self swab.  Pt states she is having lower abdominal pain and white vaginal discharge.  Pt states symptoms have been present for 2 days.  Pt denies sexual contact with anyone with a know STD. Pt denies taking any medication for pain.  Pt states she is concerned it may be a bladder infection. Pt denies urinary frequency, burning, or inability to fully empty bladder. Pt states that sometimes she can't hold her pee.  Pt also requests a pregnancy test as she is late for her period.  Pt states her period was supposed to start 12/09/18.  Pt states she has regular periods.    Pregnancy test negative.  Reviewed with Cleone Slim CNM.  Will send urine for culture.

## 2018-12-19 NOTE — Progress Notes (Signed)
Chart reviewed for nurse visit. Agree with plan of care.   Rolm Bookbinder, [place] 12/19/2018 10:20 AM

## 2018-12-20 LAB — URINE CULTURE

## 2018-12-23 LAB — CERVICOVAGINAL ANCILLARY ONLY
Bacterial vaginitis: POSITIVE — AB
Candida vaginitis: NEGATIVE
Chlamydia: NEGATIVE
Neisseria Gonorrhea: NEGATIVE
Trichomonas: NEGATIVE

## 2018-12-23 MED ORDER — METRONIDAZOLE 500 MG PO TABS: 500.0000 mg | ORAL_TABLET | Freq: Two times a day (BID) | ORAL | 0 refills | Status: DC

## 2018-12-23 NOTE — Addendum Note (Signed)
Addended by: Rolm Bookbinder on: 12/23/2018 03:50 PM   Modules accepted: Orders

## 2019-04-30 ENCOUNTER — Encounter (HOSPITAL_COMMUNITY): Payer: Self-pay | Admitting: Emergency Medicine

## 2019-04-30 ENCOUNTER — Ambulatory Visit (HOSPITAL_COMMUNITY)
Admission: EM | Admit: 2019-04-30 | Discharge: 2019-04-30 | Disposition: A | Discharge status: Home / Self Care | Payer: Medicaid Other | Attending: Emergency Medicine | Admitting: Emergency Medicine

## 2019-04-30 ENCOUNTER — Other Ambulatory Visit: Payer: Self-pay

## 2019-04-30 DIAGNOSIS — Z3202 Encounter for pregnancy test, result negative: Secondary | ICD-10-CM | POA: Diagnosis not present

## 2019-04-30 DIAGNOSIS — H9191 Unspecified hearing loss, right ear: Secondary | ICD-10-CM | POA: Insufficient documentation

## 2019-04-30 DIAGNOSIS — N3000 Acute cystitis without hematuria: Secondary | ICD-10-CM | POA: Diagnosis not present

## 2019-04-30 DIAGNOSIS — Z113 Encounter for screening for infections with a predominantly sexual mode of transmission: Secondary | ICD-10-CM | POA: Diagnosis not present

## 2019-04-30 LAB — POCT URINALYSIS DIP (DEVICE)
Bilirubin Urine: NEGATIVE
Glucose, UA: >=1000 mg/dL — AB
Nitrite: POSITIVE — AB
Protein, ur: 100 mg/dL — AB
Specific Gravity, Urine: 1.025 (ref 1.005–1.030)
Urobilinogen, UA: 0.2 mg/dL (ref 0.0–1.0)
pH: 6 (ref 5.0–8.0)

## 2019-04-30 LAB — POCT PREGNANCY, URINE: Preg Test, Ur: NEGATIVE

## 2019-04-30 MED ORDER — FLUTICASONE PROPIONATE 50 MCG/ACT NA SUSP: 1.0000 | Freq: Every day | NASAL | 0 refills | Status: DC

## 2019-04-30 MED ORDER — NITROFURANTOIN MONOHYD MACRO 100 MG PO CAPS: 100.0000 mg | ORAL_CAPSULE | Freq: Two times a day (BID) | ORAL | 0 refills | Status: AC

## 2019-04-30 NOTE — ED Provider Notes (Signed)
[facility]    CSN: [id number] Arrival date & time: 04/30/19  1904      History   Chief Complaint Chief Complaint  Patient presents with  . Urinary Tract Infection  . Possible Pregnancy    HPI Monique Schmidt is a 33 y.o. female history of bacterial vaginosis, asthma, presenting today for evaluation of urinary frequency and urgency.  Patient states that over the past day she has developed urinary frequency, incomplete voiding and a lot of pressure.  She feels as if there is a baby pressing on her bladder.  When urinating she only voids a little bit.  Denies dysuria.  Denies itching irritation or abnormal discharge.  She would like to also be screened for STDs.  She denies fevers, nausea vomiting.  Denies back pain.  She is also concerned as her right ear has had decreased hearing for the past year.  She states that it started after she was around loud drums.  Hearing in left returned, but is still very diminished in right ear.  HPI  Past Medical History:  Diagnosis Date  . Abnormal Pap smear   . Bacterial vaginosis   . Gestational diabetes    likely pre-exisiting; all pregnancies glyburide started 11/4  . Gestational thrombocytopenia (HCC)   . Headache(784.0)   . Hernia, umbilical   . History of abnormal cervical Pap smear    normal 2014  . Mild preeclampsia delivered 01/22/2016    Patient Active Problem List   Diagnosis Date Noted  . BV (bacterial vaginosis) 02/27/2016  . Bacterial vaginosis 09/20/2015  . BMI 50.0-59.9, adult (HCC) 11/09/2013  . Moderate persistent asthma 08/07/2013    Past Surgical History:  Procedure Laterality Date  . UMBILICAL HERNIA REPAIR N/A 11/22/2014   Procedure: LAPAROSCOPIC UMBILICAL HERNIA;  Surgeon: Axel FillerArmando Ramirez, MD;  Location: [facility];  Service: General;  Laterality: N/A;  . WISDOM TOOTH EXTRACTION      OB History    Gravida  6   Para  5   Term  5   Preterm      AB  1   Living  5     SAB  1   TAB      Ectopic      Multiple  0   Live Births  5            Home Medications    Prior to Admission medications   Medication Sig Start Date End Date Taking? Authorizing Provider  fluticasone (FLONASE) 50 MCG/ACT nasal spray Place 1-2 sprays into both nostrils daily for 7 days. 04/30/19 05/07/19  Wieters, Hallie C, PA-C  nitrofurantoin, macrocrystal-monohydrate, (MACROBID) 100 MG capsule Take 1 capsule (100 mg total) by mouth 2 (two) times daily for 5 days. 04/30/19 05/05/19  Wieters, Junius CreamerHallie C, PA-C    Family History Family History  Problem Relation Age of Onset  . Hypertension Father   . Diabetes Father   . Diabetes Mother   . Seizures Brother     Social History Social History   Tobacco Use  . Smoking status: Never Smoker  . Smokeless tobacco: Never Used  Substance Use Topics  . Alcohol use: No  . Drug use: No     Allergies   Latex and Penicillins   Review of Systems Review of Systems  Constitutional: Negative for fever.  HENT: Positive for hearing loss. Negative for ear pain.   Respiratory: Negative for shortness of breath.   Cardiovascular: Negative for chest pain.  Gastrointestinal:  Negative for abdominal pain, diarrhea, nausea and vomiting.  Genitourinary: Positive for frequency, pelvic pain and urgency. Negative for dysuria, flank pain, genital sores, hematuria, menstrual problem, vaginal bleeding, vaginal discharge and vaginal pain.  Musculoskeletal: Negative for back pain.  Skin: Negative for rash.  Neurological: Negative for dizziness, light-headedness and headaches.     Physical Exam Triage Vital Signs ED Triage Vitals  Enc Vitals Group     BP 04/30/19 1926 126/85     Pulse Rate 04/30/19 1926 (!) 106     Resp 04/30/19 1926 (!) 24     Temp --      Temp src --      SpO2 04/30/19 1926 100 %     Weight --      Height --      Head Circumference --      Peak Flow --      Pain Score 04/30/19 1922 0     Pain Loc --      Pain Edu? --      Excl. in GC?  --    No data found.  Updated Vital Signs BP 126/85 (BP Location: Left Arm) Comment (BP Location): regular cuff on left forearm  Pulse (!) 106   Resp (!) 24   LMP 04/03/2019   SpO2 100%   Visual Acuity Right Eye Distance:   Left Eye Distance:   Bilateral Distance:    Right Eye Near:   Left Eye Near:    Bilateral Near:     Physical Exam Vitals signs and nursing note reviewed.  Constitutional:      General: She is not in acute distress.    Appearance: She is well-developed.  HENT:     Head: Normocephalic and atraumatic.     Ears:     Comments: Bilateral ears without tenderness to palpation of external auricle, tragus and mastoid, EAC's without erythema or swelling, TM's with good bony landmarks and cone of light. Non erythematous. Eyes:     Conjunctiva/sclera: Conjunctivae normal.  Neck:     Musculoskeletal: Neck supple.  Cardiovascular:     Rate and Rhythm: Normal rate and regular rhythm.     Heart sounds: No murmur.  Pulmonary:     Effort: Pulmonary effort is normal. No respiratory distress.     Breath sounds: Normal breath sounds.  Abdominal:     Palpations: Abdomen is soft.     Tenderness: There is no abdominal tenderness.     Comments: No CVA tenderness  Skin:    General: Skin is warm and dry.  Neurological:     Mental Status: She is alert.      UC Treatments / Results  Labs (all labs ordered are listed, but only abnormal results are displayed) Labs Reviewed  POCT URINALYSIS DIP (DEVICE) - Abnormal; Notable for the following components:      Result Value   Glucose, UA >=1000 (*)    Ketones, ur TRACE (*)    Hgb urine dipstick LARGE (*)    Protein, ur 100 (*)    Nitrite POSITIVE (*)    Leukocytes,Ua TRACE (*)    All other components within normal limits  URINE CULTURE  POC URINE PREG, ED  POCT PREGNANCY, URINE  CERVICOVAGINAL ANCILLARY ONLY    EKG   Radiology No results found.  Procedures Procedures (including critical care time)   Medications Ordered in UC Medications - No data to display  Initial Impression / Assessment and Plan / UC Course  I have  reviewed the triage vital signs and the nursing notes.  Pertinent labs & imaging results that were available during my care of the patient were reviewed by me and considered in my medical decision making (see chart for details).     UA with trace leuks, positive nitrites, will treat for UTI with Macrobid twice daily x5 days.  Do not suspect pyelonephritis at this time.  Also screening for STDs.  Pregnancy test negative.  Sending urine culture as well as vaginal swab for further evaluation.  Will call patient with results and provide further treatment as needed.   Ear exam unremarkable.  Initiating on Flonase to help with any underlying eustachian tube dysfunction contributing to decreased hearing on right.  No cerumen impaction.  Follow-up with ENT if symptoms persisting.  May have damage to auditory cells given started after around loud drums.  Discussed strict return precautions. Patient verbalized understanding and is agreeable with plan.     Final Clinical Impressions(s) / UC Diagnoses   Final diagnoses:  Acute cystitis without hematuria  Decreased hearing of right ear  Screen for STD (sexually transmitted disease)     Discharge Instructions     Your urine showed signs of infection.  Please begin Macrobid twice daily for 5 days.  We will send urine for culture to confirm.  Please drink plenty of fluids. We will send swab off as well to screen for STDs.  Your ear did not have any wax, please try using Flonase nasal spray 1 to 2 sprays in each nostril daily for the next 1 to 2 weeks. Follow-up with the [facility] ENT if still having persistent decreased hearing  Follow-up here if developing worsening symptoms, worsening pain, fevers, nausea or vomiting   ED Prescriptions    Medication Sig Dispense Auth. Provider   nitrofurantoin,  macrocrystal-monohydrate, (MACROBID) 100 MG capsule Take 1 capsule (100 mg total) by mouth 2 (two) times daily for 5 days. 10 capsule Wieters, Hallie C, PA-C   fluticasone (FLONASE) 50 MCG/ACT nasal spray Place 1-2 sprays into both nostrils daily for 7 days. 1 g Wieters, [place] C, PA-C     Controlled Substance Prescriptions [place] Controlled Substance Registry consulted? Not Applicable   Janith Lima, [place] 04/30/19 2025

## 2019-04-30 NOTE — ED Triage Notes (Signed)
Urinating more frequently, small amounts of urine when she goes to urinate.  Symptoms started today  Right ear is stuffy-for one year

## 2019-04-30 NOTE — ED Notes (Signed)
Patient sent to bathroom 

## 2019-04-30 NOTE — Discharge Instructions (Signed)
Your urine showed signs of infection.  Please begin Macrobid twice daily for 5 days.  We will send urine for culture to confirm.  Please drink plenty of fluids. We will send swab off as well to screen for STDs.  Your ear did not have any wax, please try using Flonase nasal spray 1 to 2 sprays in each nostril daily for the next 1 to 2 weeks. Follow-up with the [facility] ENT if still having persistent decreased hearing  Follow-up here if developing worsening symptoms, worsening pain, fevers, nausea or vomiting

## 2019-05-02 LAB — CERVICOVAGINAL ANCILLARY ONLY
Bacterial vaginitis: POSITIVE — AB
Candida vaginitis: NEGATIVE
Chlamydia: NEGATIVE
Neisseria Gonorrhea: NEGATIVE
Trichomonas: POSITIVE — AB

## 2019-05-03 LAB — URINE CULTURE: Culture: >=100000 — AB

## 2019-05-04 ENCOUNTER — Telehealth (HOSPITAL_COMMUNITY): Payer: Self-pay | Admitting: Emergency Medicine

## 2019-05-04 MED ORDER — METRONIDAZOLE 500 MG PO TABS: 500.0000 mg | ORAL_TABLET | Freq: Two times a day (BID) | ORAL | 0 refills | Status: AC

## 2019-05-04 NOTE — Telephone Encounter (Signed)
Patient contacted and made aware of  swab  results, all questions answered Sending treatment to [facility] on [address]

## 2019-05-12 ENCOUNTER — Other Ambulatory Visit: Payer: Self-pay

## 2019-05-12 ENCOUNTER — Inpatient Hospital Stay (HOSPITAL_COMMUNITY)
Admission: AD | Admit: 2019-05-12 | Discharge: 2019-05-12 | Disposition: A | Discharge status: Home / Self Care | Payer: Medicaid Other | Attending: Family Medicine | Admitting: Family Medicine

## 2019-05-12 DIAGNOSIS — Z3202 Encounter for pregnancy test, result negative: Secondary | ICD-10-CM | POA: Insufficient documentation

## 2019-05-12 LAB — POCT PREGNANCY, URINE: Preg Test, Ur: NEGATIVE

## 2019-05-12 NOTE — MAU Note (Signed)
Julianne Handler CNM in Triage to discuss test result and d/c plan with pt. Pt then d/c home from Triage

## 2019-05-12 NOTE — MAU Provider Note (Signed)
S Ms. KAMARIE VENO is a 33 y.o. [phone] female who presents to MAU today with complaint of abdominal pain.   O BP 135/84 (BP Location: Right Arm)   Pulse 89   Temp 98.6 F (37 C)   Resp 18   LMP 04/03/2019  Physical Exam  Constitutional: She is oriented to person, place, and time. She appears well-developed and well-nourished. No distress.  HENT:  Head: Normocephalic and atraumatic.  Cardiovascular: Normal rate.  Respiratory: No respiratory distress.  Musculoskeletal: Normal range of motion.  Neurological: She is alert and oriented to person, place, and time.  Psychiatric: She has a normal mood and affect.   A Non pregnant female Medical screening exam complete  Results for orders placed or performed during the hospital encounter of 05/12/19 (from the past 24 hour(s))  Pregnancy, urine POC     Status: None   Collection Time: 05/12/19  8:19 PM  Result Value Ref Range   Preg Test, Ur NEGATIVE NEGATIVE   P Discharge from MAU in stable condition Patient given the option of transfer to [facility] for further evaluation or seek care in outpatient facility of choice List of options for follow-up given  Warning signs for worsening condition that would warrant emergency follow-up discussed Patient may return to MAU as needed for pregnancy related complaints  Julianne Handler, CNM 05/12/2019 8:46 PM

## 2019-05-12 NOTE — MAU Note (Addendum)
Having abd pain and period in August was different and no period in September. Told had Trich and uti few wks ago and treated. Having abd pain and d/c. White d/c with itching and burning when I pee. LMP 8/14. I am thinking I may have had a miscarriage. Has not done upt. Feels movement in abdomen

## 2019-06-24 ENCOUNTER — Ambulatory Visit (INDEPENDENT_AMBULATORY_CARE_PROVIDER_SITE_OTHER): Payer: Medicaid Other | Admitting: Family Medicine

## 2019-06-24 ENCOUNTER — Other Ambulatory Visit: Payer: Self-pay

## 2019-06-24 ENCOUNTER — Other Ambulatory Visit (HOSPITAL_COMMUNITY)
Admission: RE | Admit: 2019-06-24 | Discharge: 2019-06-24 | Disposition: A | Discharge status: Home / Self Care | Payer: Medicaid Other | Source: Ambulatory Visit | Attending: Family Medicine | Admitting: Family Medicine

## 2019-06-24 ENCOUNTER — Encounter: Payer: Self-pay | Admitting: Family Medicine

## 2019-06-24 VITALS — BP 136/92 | HR 98 | Wt 318.0 lb

## 2019-06-24 DIAGNOSIS — Z0000 Encounter for general adult medical examination without abnormal findings: Secondary | ICD-10-CM

## 2019-06-24 DIAGNOSIS — Z8632 Personal history of gestational diabetes: Secondary | ICD-10-CM

## 2019-06-24 DIAGNOSIS — N911 Secondary amenorrhea: Secondary | ICD-10-CM

## 2019-06-24 DIAGNOSIS — B373 Candidiasis of vulva and vagina: Secondary | ICD-10-CM

## 2019-06-24 DIAGNOSIS — Z6841 Body Mass Index (BMI) 40.0 and over, adult: Secondary | ICD-10-CM

## 2019-06-24 DIAGNOSIS — B3731 Acute candidiasis of vulva and vagina: Secondary | ICD-10-CM

## 2019-06-24 LAB — POCT PREGNANCY, URINE: Preg Test, Ur: NEGATIVE

## 2019-06-24 MED ORDER — MEDROXYPROGESTERONE ACETATE 10 MG PO TABS: 10.0000 mg | ORAL_TABLET | Freq: Every day | ORAL | 3 refills | Status: DC

## 2019-06-24 MED ORDER — FLUCONAZOLE 150 MG PO TABS: 150.0000 mg | ORAL_TABLET | Freq: Every day | ORAL | 2 refills | Status: DC

## 2019-06-24 NOTE — Progress Notes (Signed)
Hasnt seen a period in 2 months.

## 2019-06-24 NOTE — Progress Notes (Signed)
    Subjective:    Patient ID: Monique Schmidt is a 33 y.o. female presenting with Gynecologic Exam  on 06/24/2019  HPI: Here today with no cycle x 2 months. No other significant changes. Hoping to be pregnant. Had some pain and seen in ED several months ago. This has resolved and she is no longer worried about that. Needs pap and labs. Has some vaginal discharge and itching.  Review of Systems  Constitutional: Negative for chills and fever.  Respiratory: Negative for shortness of breath.   Cardiovascular: Negative for chest pain.  Gastrointestinal: Negative for abdominal pain, nausea and vomiting.  Genitourinary: Negative for dysuria.  Skin: Negative for rash.      Objective:    BP (!) 136/92   Pulse 98   Wt (!) 318 lb (144.2 kg)   BMI 51.33 kg/m  Physical Exam Constitutional:      General: She is not in acute distress.    Appearance: She is well-developed. She is morbidly obese.  HENT:     Head: Normocephalic and atraumatic.  Eyes:     General: No scleral icterus. Neck:     Musculoskeletal: Neck supple.  Cardiovascular:     Rate and Rhythm: Normal rate.  Pulmonary:     Effort: Pulmonary effort is normal.  Abdominal:     Palpations: Abdomen is soft.  Skin:    General: Skin is warm and dry.  Neurological:     Mental Status: She is alert and oriented to person, place, and time.         Assessment & Plan:   Problem List Items Addressed This Visit      Unprioritized   BMI 50.0-59.9, adult ([place])   History of gestational diabetes    Check A1C.      Relevant Orders   Hemoglobin A1c (Completed)   Secondary amenorrhea    Provera challenge--weight has not changed significantly.      Relevant Medications   medroxyPROGESTERone (PROVERA) 10 MG tablet   Other Relevant Orders   TSH (Completed)    Other Visit Diagnoses    Encounter for annual physical exam    -  Primary   Relevant Orders   Cytology - PAP( [place])   Cervicovaginal ancillary only( [facility])   Yeast vaginitis       Relevant Medications   fluconazole (DIFLUCAN) 150 MG tablet      Total face-to-face time with patient: 25 minutes. Over 50% of encounter was spent on counseling and coordination of care. Return in about 3 months (around 09/24/2019).  Donnamae Jude 06/26/2019 7:50 AM

## 2019-06-24 NOTE — Patient Instructions (Signed)

## 2019-06-25 ENCOUNTER — Other Ambulatory Visit: Payer: Self-pay | Admitting: Family Medicine

## 2019-06-25 DIAGNOSIS — E119 Type 2 diabetes mellitus without complications: Secondary | ICD-10-CM

## 2019-06-25 LAB — HEMOGLOBIN A1C
Est. average glucose Bld gHb Est-mCnc: 321 mg/dL
Hgb A1c MFr Bld: 12.8 % — ABNORMAL HIGH (ref 4.8–5.6)

## 2019-06-25 LAB — TSH: TSH: 1.34 u[IU]/mL (ref 0.450–4.500)

## 2019-06-25 MED ORDER — METFORMIN HCL 500 MG PO TABS: 500.0000 mg | ORAL_TABLET | Freq: Two times a day (BID) | ORAL | 2 refills | Status: DC

## 2019-06-26 DIAGNOSIS — Z8632 Personal history of gestational diabetes: Secondary | ICD-10-CM

## 2019-06-26 DIAGNOSIS — N911 Secondary amenorrhea: Secondary | ICD-10-CM | POA: Insufficient documentation

## 2019-06-26 HISTORY — DX: Personal history of gestational diabetes: Z86.32

## 2019-06-26 LAB — CERVICOVAGINAL ANCILLARY ONLY
Bacterial Vaginitis (gardnerella): POSITIVE — AB
Candida Glabrata: POSITIVE — AB
Candida Vaginitis: POSITIVE — AB
Chlamydia: NEGATIVE
Comment: NEGATIVE
Comment: NEGATIVE
Comment: NEGATIVE
Comment: NEGATIVE
Comment: NEGATIVE
Comment: NORMAL
Neisseria Gonorrhea: NEGATIVE
Trichomonas: NEGATIVE

## 2019-06-26 NOTE — Assessment & Plan Note (Signed)
Check A1C 

## 2019-06-26 NOTE — Assessment & Plan Note (Signed)
Provera challenge--weight has not changed significantly.

## 2019-06-29 ENCOUNTER — Encounter: Payer: Self-pay | Admitting: Family Medicine

## 2019-06-29 DIAGNOSIS — R87810 Cervical high risk human papillomavirus (HPV) DNA test positive: Secondary | ICD-10-CM | POA: Insufficient documentation

## 2019-07-01 ENCOUNTER — Encounter: Payer: Self-pay | Admitting: *Deleted

## 2019-07-03 LAB — CYTOLOGY - PAP
Adequacy: ABSENT
Comment: NEGATIVE
Comment: NEGATIVE
Diagnosis: NEGATIVE
HPV 16: NEGATIVE
HPV 18 / 45: NEGATIVE
High risk HPV: POSITIVE — AB

## 2019-08-18 ENCOUNTER — Other Ambulatory Visit: Payer: Self-pay

## 2019-08-18 ENCOUNTER — Ambulatory Visit (HOSPITAL_COMMUNITY)
Admission: EM | Admit: 2019-08-18 | Discharge: 2019-08-18 | Disposition: A | Discharge status: Home / Self Care | Payer: Medicaid Other | Attending: Internal Medicine | Admitting: Internal Medicine

## 2019-08-18 ENCOUNTER — Encounter (HOSPITAL_COMMUNITY): Payer: Self-pay | Admitting: Emergency Medicine

## 2019-08-18 DIAGNOSIS — N898 Other specified noninflammatory disorders of vagina: Secondary | ICD-10-CM | POA: Diagnosis present

## 2019-08-18 DIAGNOSIS — Z3202 Encounter for pregnancy test, result negative: Secondary | ICD-10-CM

## 2019-08-18 DIAGNOSIS — M545 Low back pain, unspecified: Secondary | ICD-10-CM

## 2019-08-18 LAB — POCT URINALYSIS DIP (DEVICE)
Bilirubin Urine: NEGATIVE
Glucose, UA: 500 mg/dL — AB
Ketones, ur: NEGATIVE mg/dL
Leukocytes,Ua: NEGATIVE
Nitrite: NEGATIVE
Protein, ur: NEGATIVE mg/dL
Specific Gravity, Urine: 1.025 (ref 1.005–1.030)
Urobilinogen, UA: 0.2 mg/dL (ref 0.0–1.0)
pH: 7 (ref 5.0–8.0)

## 2019-08-18 LAB — POC URINE PREG, ED
Preg Test, Ur: NEGATIVE
Preg Test, Ur: NEGATIVE

## 2019-08-18 LAB — POCT PREGNANCY, URINE: Preg Test, Ur: NEGATIVE

## 2019-08-18 NOTE — Discharge Instructions (Addendum)
Your pregnancy test was negative here The urine did not show any infection. We will send a swab for testing for bacteria and yeast You can take ibuprofen as needed for your back pain Follow-up with your OB/GYN for further problems

## 2019-08-18 NOTE — ED Provider Notes (Signed)
[facility]    CSN: [id number] Arrival date & time: 08/18/19  0845      History   Chief Complaint Chief Complaint  Patient presents with  . Abdominal Pain    HPI Monique Schmidt is a 33 y.o. female.   Patient is a 33 year old female past medical history of diabetes.  She presents today with lower back pain and some lower abdominal discomfort for the past 3 days.  Symptoms have been intermittent.  No associated nausea, vomiting or diarrhea.  She does have some burning with urination and vaginal irritation.  Denies any vaginal discharge but has vaginal odor.  She is also had some amenorrhea and is concerned for pregnancy.  Is not currently taking her diabetes medication.  Currently sexually active.  Unsure of last menstrual cycle.  No fever, chills, flank pain.  ROS per HPI    Abdominal Pain   Past Medical History:  Diagnosis Date  . Abnormal Pap smear   . Bacterial vaginosis   . Gestational diabetes    likely pre-exisiting; all pregnancies glyburide started 11/4  . Gestational thrombocytopenia (HCC)   . Headache(784.0)   . Hernia, umbilical   . History of abnormal cervical Pap smear    normal 2014  . Mild preeclampsia delivered 01/22/2016    Patient Active Problem List   Diagnosis Date Noted  . 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 History of gestational diabetes 06/26/2019  . 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 BMI 50.0-59.9, adult (HCC) 11/09/2013  . Moderate persistent asthma 08/07/2013  . Diabetes mellitus 04/16/2011    Past Surgical History:  Procedure Laterality Date  . UMBILICAL HERNIA REPAIR N/A 11/22/2014   Procedure: LAPAROSCOPIC UMBILICAL HERNIA;  Surgeon: Axel FillerArmando Ramirez, MD;  Location: [facility];  Service: General;  Laterality: N/A;  . WISDOM TOOTH EXTRACTION      OB History    Gravida  6   Para  5   Term  5   Preterm      AB  1   Living  5     SAB  1   TAB      Ectopic      Multiple  0    Live Births  5            Home Medications    Prior to Admission medications   Medication Sig Start Date End Date Taking? Authorizing Provider  fluticasone (FLONASE) 50 MCG/ACT nasal spray Place 1-2 sprays into both nostrils daily for 7 days. 04/30/19 05/07/19  Wieters, Hallie C, PA-C  metFORMIN (GLUCOPHAGE) 500 MG tablet Take 1 tablet (500 mg total) by mouth 2 (two) times daily with a meal. 06/25/19   Reva BoresPratt, Tanya S, MD  medroxyPROGESTERone (PROVERA) 10 MG tablet Take 1 tablet (10 mg total) by mouth daily. 06/24/19 08/18/19  Reva BoresPratt, Tanya S, MD    Family History Family History  Problem Relation Age of Onset  . Hypertension Father   . Diabetes Father   . Diabetes Mother   . Seizures Brother     Social History Social History   Tobacco Use  . Smoking status: Never Smoker  . Smokeless tobacco: Never Used  Substance Use Topics  . Alcohol use: No  . Drug use: No     Allergies   Latex and Penicillins   Review of Systems Review of Systems  Gastrointestinal: Positive for abdominal pain.     Physical Exam Triage Vital Signs ED Triage Vitals  Enc  Vitals Group     BP 08/18/19 0922 (!) 123/93     Pulse Rate 08/18/19 0922 78     Resp 08/18/19 0922 18     Temp 08/18/19 0922 98.5 F (36.9 C)     Temp Source 08/18/19 0922 Oral     SpO2 08/18/19 0922 97 %     Weight --      Height --      Head Circumference --      Peak Flow --      Pain Score 08/18/19 0925 8     Pain Loc --      Pain Edu? --      Excl. in GC? --    No data found.  Updated Vital Signs BP (!) 123/93 (BP Location: Left Arm)   Pulse 78   Temp 98.5 F (36.9 C) (Oral)   Resp 18   LMP 07/04/2019   SpO2 97%   Visual Acuity Right Eye Distance:   Left Eye Distance:   Bilateral Distance:    Right Eye Near:   Left Eye Near:    Bilateral Near:     Physical Exam Vitals and nursing note reviewed.  Constitutional:      General: She is not in acute distress.    Appearance: Normal appearance.  She is obese. She is not ill-appearing, toxic-appearing or diaphoretic.  HENT:     Head: Normocephalic.     Nose: Nose normal.     Mouth/Throat:     Pharynx: Oropharynx is clear.  Eyes:     Conjunctiva/sclera: Conjunctivae normal.  Pulmonary:     Effort: Pulmonary effort is normal.  Abdominal:     Palpations: Abdomen is soft.     Tenderness: There is no abdominal tenderness.  Musculoskeletal:        General: Normal range of motion.     Cervical back: Normal range of motion.  Skin:    General: Skin is warm and dry.     Findings: No rash.  Neurological:     Mental Status: She is alert.  Psychiatric:        Mood and Affect: Mood normal.      UC Treatments / Results  Labs (all labs ordered are listed, but only abnormal results are displayed) Labs Reviewed  POCT URINALYSIS DIP (DEVICE) - Abnormal; Notable for the following components:      Result Value   Glucose, UA 500 (*)    Hgb urine dipstick TRACE (*)    All other components within normal limits  POCT PREGNANCY, URINE  POC URINE PREG, ED  POC URINE PREG, ED  CERVICOVAGINAL ANCILLARY ONLY    EKG   Radiology No results found.  Procedures Procedures (including critical care time)  Medications Ordered in UC Medications - No data to display  Initial Impression / Assessment and Plan / UC Course  I have reviewed the triage vital signs and the nursing notes.  Pertinent labs & imaging results that were available during my care of the patient were reviewed by me and considered in my medical decision making (see chart for details).     Low abdominal pain with vaginal odor-urine with trace blood and glucose Not concerned for urinary tract infection or pyelonephritis at this time. Pregnancy test negative We will send swab for testing for STDs, BV and yeast. Back pain could be musculoskeletal.  We will have her take ibuprofen as needed Spoke with patient about restarting her Metformin.  Final Clinical  Impressions(s) /  UC Diagnoses   Final diagnoses:  Acute low back pain, unspecified back pain laterality, unspecified whether sciatica present  Vaginal odor     Discharge Instructions     Your pregnancy test was negative here The urine did not show any infection. We will send a swab for testing for bacteria and yeast You can take ibuprofen as needed for your back pain Follow-up with your OB/GYN for further problems    ED Prescriptions    None     PDMP not reviewed this encounter.   Loura Halt A, NP 08/18/19 1056

## 2019-08-18 NOTE — ED Triage Notes (Signed)
Back pain initially, now abdominal pain.  Pain started 3 days ago.  No nausea, vomiting or diarrhea.  Patient reports burning with urination.  denies vaginal discharge

## 2019-08-19 ENCOUNTER — Telehealth: Payer: Self-pay | Admitting: Family Medicine

## 2019-08-19 NOTE — Telephone Encounter (Signed)
Patient is requesting a call back. She is having pain, and needs to see if she can get something for pain until her visit.

## 2019-08-20 ENCOUNTER — Telehealth (HOSPITAL_COMMUNITY): Payer: Self-pay | Admitting: Emergency Medicine

## 2019-08-20 LAB — CERVICOVAGINAL ANCILLARY ONLY
Bacterial vaginitis: POSITIVE — AB
Candida vaginitis: POSITIVE — AB
Chlamydia: NEGATIVE
Neisseria Gonorrhea: NEGATIVE
Trichomonas: NEGATIVE

## 2019-08-20 MED ORDER — FLUCONAZOLE 150 MG PO TABS: 150.0000 mg | ORAL_TABLET | Freq: Once | ORAL | 0 refills | Status: AC

## 2019-08-20 MED ORDER — METRONIDAZOLE 500 MG PO TABS: 500.0000 mg | ORAL_TABLET | Freq: Two times a day (BID) | ORAL | 0 refills | Status: AC

## 2019-08-20 NOTE — Telephone Encounter (Signed)
Bacterial vaginosis is positive. This was not treated at the urgent care visit.  Flagyl 500 mg BID x 7 days #14 no refills sent to patients pharmacy of choice.    Test for candida (yeast) was positive.  Prescription for fluconazole 150mg po now, repeat dose in 3d if needed, #2 no refills, sent to the pharmacy of record.  Recheck or followup with PCP for further evaluation if symptoms are not improving.    Patient contacted by phone and made aware of    results. Pt verbalized understanding and had all questions answered.    

## 2019-08-24 NOTE — Telephone Encounter (Signed)
Called pt in regards to her abdominal pain. Pt reports she is taking her medication for BV and Yeast infection. She reports her back is still hurting intermittently some.  Enc pt to take Ibuprofen as needed, she has been using BC. She reports they are not helping. She is using a heating pad also.   She reports she had a period for 2 days and then stopped. She reports her period after Provera, she had a 3 day period. She reports she had 2 days of regular period and then a day of heavy bleeding and then it stopped.   She reports she is voiding and stooling regularly. Pt reports she is taking her Metformin again.

## 2019-09-18 ENCOUNTER — Encounter: Payer: Self-pay | Admitting: Family Medicine

## 2019-09-18 ENCOUNTER — Ambulatory Visit (INDEPENDENT_AMBULATORY_CARE_PROVIDER_SITE_OTHER): Payer: Medicaid Other | Admitting: Family Medicine

## 2019-09-18 ENCOUNTER — Other Ambulatory Visit: Payer: Self-pay

## 2019-09-18 VITALS — BP 127/79 | HR 72 | Ht 66.0 in | Wt 321.1 lb

## 2019-09-18 DIAGNOSIS — B379 Candidiasis, unspecified: Secondary | ICD-10-CM

## 2019-09-18 DIAGNOSIS — R102 Pelvic and perineal pain: Secondary | ICD-10-CM

## 2019-09-18 DIAGNOSIS — E1165 Type 2 diabetes mellitus with hyperglycemia: Secondary | ICD-10-CM | POA: Diagnosis not present

## 2019-09-18 DIAGNOSIS — E119 Type 2 diabetes mellitus without complications: Secondary | ICD-10-CM

## 2019-09-18 DIAGNOSIS — N911 Secondary amenorrhea: Secondary | ICD-10-CM

## 2019-09-18 LAB — POCT PREGNANCY, URINE: Preg Test, Ur: NEGATIVE

## 2019-09-18 LAB — POCT URINALYSIS DIP (DEVICE)
Bilirubin Urine: NEGATIVE
Glucose, UA: 500 mg/dL — AB
Hgb urine dipstick: NEGATIVE
Ketones, ur: NEGATIVE mg/dL
Leukocytes,Ua: NEGATIVE
Nitrite: NEGATIVE
Protein, ur: NEGATIVE mg/dL
Specific Gravity, Urine: >=1.03 (ref 1.005–1.030)
Urobilinogen, UA: 0.2 mg/dL (ref 0.0–1.0)
pH: 6.5 (ref 5.0–8.0)

## 2019-09-18 MED ORDER — FLUCONAZOLE 150 MG PO TABS: 150.0000 mg | ORAL_TABLET | Freq: Every day | ORAL | 2 refills | Status: DC

## 2019-09-18 NOTE — Assessment & Plan Note (Signed)
Needs Primary care, meter, diet reviewed. RI\isks of poorly controlled DM on long term health reviewed at length as well as dangers of pregnancy with A1C > 10. UPT is negative today. Would very likely need insulin for pregnancy which she is adamant she does not want and does not like metformin either. There are alternatives which are not safe in pregnancy and she is unsure she wants another pregnancy right now.

## 2019-09-18 NOTE — Progress Notes (Signed)
   Subjective:    Patient ID: Monique Schmidt is a 34 y.o. female presenting with Gynecologic Exam  on 09/18/2019  HPI: Took provera and had a heavy cycle. Supposed to have her cycle but she is not on it. Has had real bad back pain. Notes the Metformin gives her diarrhea. She is still wanting to get pregnant. Last A1C is 12.3. Having abdominal pain, ? Related to metformin or other. She has changed her diet somewhat, and is limiting sodas. LMP about 1 month ago. Wants pregnancy test today. Has yeast infection.  Review of Systems  Constitutional: Negative for chills and fever.  Respiratory: Negative for shortness of breath.   Cardiovascular: Negative for chest pain.  Gastrointestinal: Negative for abdominal pain, nausea and vomiting.  Genitourinary: Negative for dysuria.  Skin: Negative for rash.      Objective:    BP 127/79   Pulse 72   Ht 5\' 6"  (1.676 m)   Wt (!) 321 lb 1.6 oz (145.7 kg)   LMP 07/23/2019 (Approximate)   BMI 51.83 kg/m  Physical Exam Constitutional:      General: She is not in acute distress.    Appearance: She is well-developed. She is obese.  HENT:     Head: Normocephalic and atraumatic.  Eyes:     General: No scleral icterus. Cardiovascular:     Rate and Rhythm: Normal rate.  Pulmonary:     Effort: Pulmonary effort is normal.  Abdominal:     Palpations: Abdomen is soft.  Musculoskeletal:     Cervical back: Neck supple.  Skin:    General: Skin is warm and dry.  Neurological:     Mental Status: She is alert and oriented to person, place, and time.         Assessment & Plan:   Problem List Items Addressed This Visit      Unprioritized   Diabetes mellitus (HCC) - Primary    Needs Primary care, meter, diet reviewed. RI\isks of poorly controlled DM on long term health reviewed at length as well as dangers of pregnancy with A1C > 10. UPT is negative today. Would very likely need insulin for pregnancy which she is adamant she does not want and does  not like metformin either. There are alternatives which are not safe in pregnancy and she is unsure she wants another pregnancy right now.      Relevant Orders   Hemoglobin A1c    Other Visit Diagnoses    Pelvic pain       Check pelvic sonogram   Relevant Orders   14/10/2018 PELVIC COMPLETE WITH TRANSVAGINAL   Yeast infection       likely related to poorly controlled DM   Relevant Medications   fluconazole (DIFLUCAN) 150 MG tablet      Total face-to-face time with patient: 17 minutes. Over 50% of encounter was spent on counseling and coordination of care. Return in about 3 months (around 12/17/2019) for primary care referral, virtual.  12/19/2019 09/18/2019 10:14 AM

## 2019-09-19 LAB — HEMOGLOBIN A1C
Est. average glucose Bld gHb Est-mCnc: 321 mg/dL
Hgb A1c MFr Bld: 12.8 % — ABNORMAL HIGH (ref 4.8–5.6)

## 2019-10-30 ENCOUNTER — Ambulatory Visit (HOSPITAL_COMMUNITY)
Admission: EM | Admit: 2019-10-30 | Discharge: 2019-10-30 | Disposition: A | Discharge status: Home / Self Care | Payer: Medicaid Other | Attending: Family Medicine | Admitting: Family Medicine

## 2019-10-30 ENCOUNTER — Encounter (HOSPITAL_COMMUNITY): Payer: Self-pay | Admitting: Family Medicine

## 2019-10-30 ENCOUNTER — Other Ambulatory Visit: Payer: Self-pay

## 2019-10-30 DIAGNOSIS — Z3202 Encounter for pregnancy test, result negative: Secondary | ICD-10-CM | POA: Diagnosis not present

## 2019-10-30 DIAGNOSIS — R8281 Pyuria: Secondary | ICD-10-CM | POA: Insufficient documentation

## 2019-10-30 DIAGNOSIS — B379 Candidiasis, unspecified: Secondary | ICD-10-CM | POA: Insufficient documentation

## 2019-10-30 DIAGNOSIS — B356 Tinea cruris: Secondary | ICD-10-CM | POA: Diagnosis not present

## 2019-10-30 DIAGNOSIS — N898 Other specified noninflammatory disorders of vagina: Secondary | ICD-10-CM | POA: Insufficient documentation

## 2019-10-30 LAB — POC URINE PREG, ED
Preg Test, Ur: NEGATIVE
Preg Test, Ur: NEGATIVE

## 2019-10-30 LAB — POCT URINALYSIS DIP (DEVICE)
Bilirubin Urine: NEGATIVE
Glucose, UA: 500 mg/dL — AB
Ketones, ur: NEGATIVE mg/dL
Nitrite: NEGATIVE
Protein, ur: NEGATIVE mg/dL
Specific Gravity, Urine: 1.025 (ref 1.005–1.030)
Urobilinogen, UA: 0.2 mg/dL (ref 0.0–1.0)
pH: 5.5 (ref 5.0–8.0)

## 2019-10-30 LAB — POCT PREGNANCY, URINE: Preg Test, Ur: NEGATIVE

## 2019-10-30 MED ORDER — KETOCONAZOLE 2 % EX CREA: 1.0000 "application " | TOPICAL_CREAM | Freq: Two times a day (BID) | CUTANEOUS | 0 refills | Status: DC

## 2019-10-30 MED ORDER — FLUCONAZOLE 150 MG PO TABS: 150.0000 mg | ORAL_TABLET | Freq: Every day | ORAL | 2 refills | Status: DC

## 2019-10-30 MED ORDER — NITROFURANTOIN MONOHYD MACRO 100 MG PO CAPS: 100.0000 mg | ORAL_CAPSULE | Freq: Two times a day (BID) | ORAL | 0 refills | Status: DC

## 2019-10-30 NOTE — ED Provider Notes (Signed)
[facility]    CSN: [id number] Arrival date & time: 10/30/19  9485      History   Chief Complaint Chief Complaint  Patient presents with  . Abdominal Pain    HPI Monique Schmidt is a 34 y.o. female.   [facility]ablished [facility] patient presents today with lower abdominal pain.  Patient is on no meds, having stopped back pain meds and Metformin a month ago.  LMP was 2/5-10 and she had one day of spotting on 2/25.  No nausea, cough, blood in stool, vomiting, fever, sore throat.  She does have white vaginal discharge, but no dysuria or frequency.  Also notes inframammary rash     Past Medical History:  Diagnosis Date  . Abnormal Pap smear   . Bacterial vaginosis   . Gestational diabetes    likely pre-exisiting; all pregnancies glyburide started 11/4  . Gestational thrombocytopenia (HCC)   . Headache(784.0)   . Hernia, umbilical   . History of abnormal cervical Pap smear    normal 2014  . Mild preeclampsia delivered 01/22/2016    Patient Active Problem List   Diagnosis Date Noted  . 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 History of gestational diabetes 06/26/2019  . 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 BMI 50.0-59.9, adult (HCC) 11/09/2013  . Moderate persistent asthma 08/07/2013  . Diabetes mellitus (HCC) 04/16/2011    Past Surgical History:  Procedure Laterality Date  . UMBILICAL HERNIA REPAIR N/A 11/22/2014   Procedure: LAPAROSCOPIC UMBILICAL HERNIA;  Surgeon: Axel Filler, MD;  Location: [facility] OR;  Service: General;  Laterality: N/A;  . WISDOM TOOTH EXTRACTION      OB History    Gravida  6   Para  5   Term  5   Preterm      AB  1   Living  5     SAB  1   TAB      Ectopic      Multiple  0   Live Births  5            Home Medications    Prior to Admission medications   Medication Sig Start Date End Date Taking? Authorizing Provider  fluconazole (DIFLUCAN) 150 MG tablet Take 1 tablet (150 mg  total) by mouth daily. Repeat in 24 hours if needed 10/30/19   Elvina Sidle, MD  fluticasone [facility]) 50 MCG/ACT nasal spray Place 1-2 sprays into both nostrils daily for 7 days. 04/30/19 05/07/19  Wieters, Hallie C, PA-C  ketoconazole (NIZORAL) 2 % cream Apply 1 application topically 2 (two) times daily. 10/30/19   Elvina Sidle, MD  nitrofurantoin, macrocrystal-monohydrate, (MACROBID) 100 MG capsule Take 1 capsule (100 mg total) by mouth 2 (two) times daily. 10/30/19   Elvina Sidle, MD  Oxycodone HCl 10 MG TABS Take 10 mg by mouth 3 (three) times daily as needed. 08/28/19   [provider]  medroxyPROGESTERone (PROVERA) 10 MG tablet Take 1 tablet (10 mg total) by mouth daily. 06/24/19 08/18/19  Reva Bores, MD  metFORMIN (GLUCOPHAGE) 500 MG tablet Take 1 tablet (500 mg total) by mouth 2 (two) times daily with a meal. 06/25/19 10/30/19  Reva Bores, MD    Family History Family History  Problem Relation Age of Onset  . Hypertension Father   . Diabetes Father   . Diabetes Mother   . Seizures Brother     Social History Social History   Tobacco Use  . Smoking status: Never  Smoker  . Smokeless tobacco: Never Used  Substance Use Topics  . Alcohol use: No  . Drug use: No     Allergies   Latex and Penicillins   Review of Systems Review of Systems  Gastrointestinal: Positive for abdominal pain.  Genitourinary: Positive for vaginal discharge.  Musculoskeletal: Positive for back pain.  Skin: Positive for rash.  All other systems reviewed and are negative.    Physical Exam Triage Vital Signs ED Triage Vitals  Enc Vitals Group     BP      Pulse      Resp      Temp      Temp src      SpO2      Weight      Height      Head Circumference      Peak Flow      Pain Score      Pain Loc      Pain Edu?      Excl. in [place]?    No data found.  Updated Vital Signs BP 120/76 (BP Location: Left Arm)   Pulse 76   Temp 98.6 F (37 C) (Oral)   Resp 19   Wt (!)  146.4 kg   LMP 09/26/2019   SpO2 97%   BMI 52.10 kg/m   Physical Exam Vitals and nursing note reviewed.  Constitutional:      General: She is not in acute distress.    Appearance: She is well-developed. She is obese. She is not ill-appearing, toxic-appearing or diaphoretic.  HENT:     Head: Normocephalic.  Eyes:     Extraocular Movements: Extraocular movements intact.  Pulmonary:     Effort: Pulmonary effort is normal.     Breath sounds: Normal breath sounds.  Abdominal:     General: Abdomen is protuberant. Bowel sounds are normal. There is no distension.     Palpations: Abdomen is soft.     Tenderness: There is no abdominal tenderness.  Skin:    General: Skin is warm and dry.  Neurological:     General: No focal deficit present.     Mental Status: She is alert.  Psychiatric:        Mood and Affect: Mood normal.        Behavior: Behavior normal.      UC Treatments / Results  Labs (all labs ordered are listed, but only abnormal results are displayed) Labs Reviewed  POCT URINALYSIS DIP (DEVICE) - Abnormal; Notable for the following components:      Result Value   Glucose, UA 500 (*)    Hgb urine dipstick TRACE (*)    Leukocytes,Ua TRACE (*)    All other components within normal limits  URINE CULTURE  POC URINE PREG, ED  POCT PREGNANCY, URINE  POC URINE PREG, ED  CERVICOVAGINAL ANCILLARY ONLY    EKG   Radiology No results found.  Procedures Procedures (including critical care time)  Medications Ordered in UC Medications - No data to display  Initial Impression / Assessment and Plan / UC Course  I have reviewed the triage vital signs and the nursing notes.  Pertinent labs & imaging results that were available during my care of the patient were reviewed by me and considered in my medical decision making (see chart for details).    Final Clinical Impressions(s) / UC Diagnoses   Final diagnoses:  Pyuria  Vaginal discharge  Tinea cruris   Discharge  Instructions   None  ED Prescriptions    Medication Sig Dispense Auth. Provider   fluconazole (DIFLUCAN) 150 MG tablet Take 1 tablet (150 mg total) by mouth daily. Repeat in 24 hours if needed 2 tablet Elvina Sidle, MD   ketoconazole (NIZORAL) 2 % cream Apply 1 application topically 2 (two) times daily. 30 g Elvina Sidle, MD   nitrofurantoin, macrocrystal-monohydrate, (MACROBID) 100 MG capsule Take 1 capsule (100 mg total) by mouth 2 (two) times daily. 10 capsule Elvina Sidle, MD     I have reviewed the PDMP during this encounter.   Elvina Sidle, MD 10/30/19 1114

## 2019-10-30 NOTE — ED Triage Notes (Signed)
Pt is here with abdominal pain that started last night. Pt has not taken any meds relieve discomfort. States she missed her period this month, wants a pregnancy test.

## 2019-10-31 LAB — URINE CULTURE: Culture: >=100000 — AB

## 2019-11-04 LAB — CERVICOVAGINAL ANCILLARY ONLY
Bacterial vaginitis: POSITIVE — AB
Candida vaginitis: POSITIVE — AB
Chlamydia: POSITIVE — AB
Neisseria Gonorrhea: NEGATIVE
Trichomonas: NEGATIVE

## 2019-11-05 ENCOUNTER — Telehealth (HOSPITAL_COMMUNITY): Payer: Self-pay

## 2019-11-05 MED ORDER — METRONIDAZOLE 500 MG PO TABS: 500.0000 mg | ORAL_TABLET | Freq: Two times a day (BID) | ORAL | 0 refills | Status: DC

## 2019-11-05 NOTE — Telephone Encounter (Signed)
Chlamydia is positive.  Rx po zithromax 1g #1 dose no refills was sent to the pharmacy of record.  Please refrain from sexual intercourse for 7 days to give the medicine time to work, sexual partners need to be notified and tested/treated.  Condoms may reduce risk of reinfection.  Recheck or followup with PCP for further evaluation if symptoms are not improving.   [facility] notified.  Pt was treated for BV with flagyl. Patient given diflucan at time of visit. Patient states she has filled rx's.

## 2019-11-23 ENCOUNTER — Other Ambulatory Visit: Payer: Self-pay

## 2019-11-23 ENCOUNTER — Ambulatory Visit (HOSPITAL_COMMUNITY)
Admission: EM | Admit: 2019-11-23 | Discharge: 2019-11-23 | Disposition: A | Discharge status: Home / Self Care | Payer: Medicaid Other | Attending: Family Medicine | Admitting: Family Medicine

## 2019-11-23 ENCOUNTER — Encounter (HOSPITAL_COMMUNITY): Payer: Self-pay | Admitting: Emergency Medicine

## 2019-11-23 DIAGNOSIS — B372 Candidiasis of skin and nail: Secondary | ICD-10-CM

## 2019-11-23 DIAGNOSIS — Z202 Contact with and (suspected) exposure to infections with a predominantly sexual mode of transmission: Secondary | ICD-10-CM

## 2019-11-23 DIAGNOSIS — R109 Unspecified abdominal pain: Secondary | ICD-10-CM

## 2019-11-23 MED ORDER — IBUPROFEN 800 MG PO TABS: 800.0000 mg | ORAL_TABLET | Freq: Three times a day (TID) | ORAL | 0 refills | Status: DC

## 2019-11-23 MED ORDER — ONDANSETRON HCL 4 MG PO TABS: 4.0000 mg | ORAL_TABLET | Freq: Four times a day (QID) | ORAL | 0 refills | Status: DC

## 2019-11-23 MED ORDER — NYSTATIN 100000 UNIT/GM EX POWD: 1.0000 "application " | Freq: Three times a day (TID) | CUTANEOUS | 0 refills | Status: DC

## 2019-11-23 MED ORDER — NYSTATIN 100000 UNIT/GM EX CREA: TOPICAL_CREAM | CUTANEOUS | 0 refills | Status: DC

## 2019-11-23 MED ORDER — TIZANIDINE HCL 4 MG PO TABS: 4.0000 mg | ORAL_TABLET | Freq: Four times a day (QID) | ORAL | 0 refills | Status: DC | PRN

## 2019-11-23 MED ORDER — DOXYCYCLINE HYCLATE 100 MG PO CAPS: 100.0000 mg | ORAL_CAPSULE | Freq: Two times a day (BID) | ORAL | 0 refills | Status: DC

## 2019-11-23 NOTE — ED Provider Notes (Signed)
[facility]    CSN: [id number] Arrival date & time: 11/23/19  1930      History   Chief Complaint Chief Complaint  Patient presents with  . Back Pain  . Exposure to STD    HPI Monique Schmidt is a 34 y.o. female.   HPI  Patient has multiple complaints She was positive for chlamydia.  She took her for azithromycin pills.  She threw them up.  She would like to be retreated She also has recurring yeast infections under her breast.  Her skin is red and itchy at this time.  She would like to have this treated. For the last 2 days she has had pain in her mid back.  She points to the right flank and lumbar region.  No accident.  No injury.  No fall.  Patient has no abdominal pain, urinary symptoms, or history of kidney problems.  She thinks she has a strained muscle from work.  She works as a Electrical engineer.   Past Medical History:  Diagnosis Date  . Abnormal Pap smear   . Bacterial vaginosis   . Gestational diabetes    likely pre-exisiting; all pregnancies glyburide started 11/4  . Gestational thrombocytopenia (HCC)   . Headache(784.0)   . Hernia, umbilical   . History of abnormal cervical Pap smear    normal 2014  . Mild preeclampsia delivered 01/22/2016    Patient Active Problem List   Diagnosis Date Noted  . 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 History of gestational diabetes 06/26/2019  . 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 BMI 50.0-59.9, adult (HCC) 11/09/2013  . Moderate persistent asthma 08/07/2013  . Diabetes mellitus (HCC) 04/16/2011    Past Surgical History:  Procedure Laterality Date  . UMBILICAL HERNIA REPAIR N/A 11/22/2014   Procedure: LAPAROSCOPIC UMBILICAL HERNIA;  Surgeon: Axel Filler, MD;  Location: [facility];  Service: General;  Laterality: N/A;  . WISDOM TOOTH EXTRACTION      OB History    Gravida  6   Para  5   Term  5   Preterm      AB  1   Living  5     SAB  1   TAB      Ectopic     Multiple  0   Live Births  5            Home Medications    Prior to Admission medications   Medication Sig Start Date End Date Taking? Authorizing Provider  doxycycline (VIBRAMYCIN) 100 MG capsule Take 1 capsule (100 mg total) by mouth 2 (two) times daily. 11/23/19   Eustace Moore, MD  fluticasone [facility]) 50 MCG/ACT nasal spray Place 1-2 sprays into both nostrils daily for 7 days. 04/30/19 05/07/19  Wieters, Hallie C, PA-C  ibuprofen (ADVIL) 800 MG tablet Take 1 tablet (800 mg total) by mouth 3 (three) times daily. 11/23/19   Eustace Moore, MD  nystatin (MYCOSTATIN/NYSTOP) powder Apply 1 application topically 3 (three) times daily. 11/23/19   Eustace Moore, MD  nystatin cream (MYCOSTATIN) Apply to affected area 2 times daily 11/23/19   Eustace Moore, MD  ondansetron (ZOFRAN) 4 MG tablet Take 1 tablet (4 mg total) by mouth every 6 (six) hours. 11/23/19   Eustace Moore, MD  tiZANidine (ZANAFLEX) 4 MG tablet Take 1-2 tablets (4-8 mg total) by mouth every 6 (six) hours as needed for muscle spasms. 11/23/19   Rica Mast  Fannie Knee, MD  medroxyPROGESTERone (PROVERA) 10 MG tablet Take 1 tablet (10 mg total) by mouth daily. 06/24/19 08/18/19  Reva Bores, MD  metFORMIN (GLUCOPHAGE) 500 MG tablet Take 1 tablet (500 mg total) by mouth 2 (two) times daily with a meal. 06/25/19 10/30/19  Reva Bores, MD    Family History Family History  Problem Relation Age of Onset  . Hypertension Father   . Diabetes Father   . Diabetes Mother   . Seizures Brother     Social History Social History   Tobacco Use  . Smoking status: Never Smoker  . Smokeless tobacco: Never Used  Substance Use Topics  . Alcohol use: No  . Drug use: No     Allergies   Latex and Penicillins   Review of Systems Review of Systems  Genitourinary: Positive for vaginal discharge.  Musculoskeletal: Positive for back pain.  Skin: Positive for rash.     Physical Exam Triage Vital Signs ED Triage  Vitals  Enc Vitals Group     BP 11/23/19 2017 125/83     Pulse Rate 11/23/19 2017 93     Resp 11/23/19 2017 18     Temp 11/23/19 2017 98.4 F (36.9 C)     Temp Source 11/23/19 2017 Oral     SpO2 11/23/19 2017 97 %     Weight --      Height --      Head Circumference --      Peak Flow --      Pain Score 11/23/19 2018 5     Pain Loc --      Pain Edu? --      Excl. in [place]? --    No data found.  Updated Vital Signs BP 125/83 (BP Location: Left Arm)   Pulse 93   Temp 98.4 F (36.9 C) (Oral)   Resp 18   SpO2 97%     Physical Exam Constitutional:      General: She is not in acute distress.    Appearance: She is well-developed.  HENT:     Head: Normocephalic and atraumatic.  Eyes:     Conjunctiva/sclera: Conjunctivae normal.     Pupils: Pupils are equal, round, and reactive to light.  Cardiovascular:     Rate and Rhythm: Normal rate.     Heart sounds: Normal heart sounds.  Pulmonary:     Effort: Pulmonary effort is normal. No respiratory distress.     Breath sounds: Normal breath sounds.  Chest:     Chest wall: No tenderness.  Abdominal:     General: There is no distension.     Palpations: Abdomen is soft.  Musculoskeletal:        General: Normal range of motion.     Cervical back: Normal range of motion.     Comments: Mild tenderness in the right upper lumbar muscles.  No CVA tenderness to percussion  Skin:    General: Skin is warm and dry.     Comments: Candida under breasts  Neurological:     Mental Status: She is alert.  Psychiatric:        Mood and Affect: Mood normal.        Behavior: Behavior normal.      UC Treatments / Results  Labs (all labs ordered are listed, but only abnormal results are displayed) Labs Reviewed - No data to display  EKG   Radiology No results found.  Procedures Procedures (including critical care time)  Medications  Ordered in UC Medications - No data to display  Initial Impression / Assessment and Plan / UC Course   I have reviewed the triage vital signs and the nursing notes.  Pertinent labs & imaging results that were available during my care of the patient were reviewed by me and considered in my medical decision making (see chart for details).      Final Clinical Impressions(s) / UC Diagnoses   Final diagnoses:  Chlamydia contact, untreated  Flank pain  Candidal intertrigo     Discharge Instructions     Take doxycycline 2 times a day for 7 days This is for the chlamydia infection I have given you Zofran in case you have nausea  Take ibuprofen 3 times a day with food.  This is for pain. Take tizanidine as needed.  This is a muscle relaxer You may return to work on Wednesday  I have given you nystatin cream to use under your breasts.  Use this twice a day until the rash clears up After the rash is gone you can use the powder once or twice a day to prevent rash from coming back   ED Prescriptions    Medication Sig Dispense Auth. Provider   doxycycline (VIBRAMYCIN) 100 MG capsule Take 1 capsule (100 mg total) by mouth 2 (two) times daily. 14 capsule Raylene Everts, MD   nystatin cream (MYCOSTATIN) Apply to affected area 2 times daily 30 g Raylene Everts, MD   nystatin (MYCOSTATIN/NYSTOP) powder Apply 1 application topically 3 (three) times daily. 15 g Raylene Everts, MD   ibuprofen (ADVIL) 800 MG tablet Take 1 tablet (800 mg total) by mouth 3 (three) times daily. 21 tablet Raylene Everts, MD   tiZANidine (ZANAFLEX) 4 MG tablet Take 1-2 tablets (4-8 mg total) by mouth every 6 (six) hours as needed for muscle spasms. 21 tablet Raylene Everts, MD   ondansetron (ZOFRAN) 4 MG tablet Take 1 tablet (4 mg total) by mouth every 6 (six) hours. 12 tablet Raylene Everts, MD     PDMP not reviewed this encounter.   Raylene Everts, MD 11/23/19 2128

## 2019-11-23 NOTE — Discharge Instructions (Signed)
Take doxycycline 2 times a day for 7 days This is for the chlamydia infection I have given you Zofran in case you have nausea  Take ibuprofen 3 times a day with food.  This is for pain. Take tizanidine as needed.  This is a muscle relaxer You may return to work on Wednesday  I have given you nystatin cream to use under your breasts.  Use this twice a day until the rash clears up After the rash is gone you can use the powder once or twice a day to prevent rash from coming back

## 2019-11-23 NOTE — ED Triage Notes (Signed)
Pt sts mid back pain and sts hx of recent treatment for chlamydia and she sts she did not complete meds and feels like she still has it

## 2019-11-26 ENCOUNTER — Other Ambulatory Visit: Payer: Self-pay

## 2019-11-26 ENCOUNTER — Encounter (HOSPITAL_COMMUNITY): Payer: Self-pay

## 2019-11-26 ENCOUNTER — Ambulatory Visit (HOSPITAL_COMMUNITY)
Admission: EM | Admit: 2019-11-26 | Discharge: 2019-11-26 | Disposition: A | Discharge status: Home / Self Care | Payer: Medicaid Other | Attending: Family Medicine | Admitting: Family Medicine

## 2019-11-26 DIAGNOSIS — N760 Acute vaginitis: Secondary | ICD-10-CM | POA: Diagnosis not present

## 2019-11-26 DIAGNOSIS — M545 Low back pain, unspecified: Secondary | ICD-10-CM

## 2019-11-26 DIAGNOSIS — H60391 Other infective otitis externa, right ear: Secondary | ICD-10-CM | POA: Diagnosis not present

## 2019-11-26 DIAGNOSIS — H9201 Otalgia, right ear: Secondary | ICD-10-CM

## 2019-11-26 DIAGNOSIS — Z3202 Encounter for pregnancy test, result negative: Secondary | ICD-10-CM

## 2019-11-26 LAB — POC URINE PREG, ED
Preg Test, Ur: NEGATIVE
Preg Test, Ur: NEGATIVE

## 2019-11-26 LAB — POCT PREGNANCY, URINE: Preg Test, Ur: NEGATIVE

## 2019-11-26 MED ORDER — FLUCONAZOLE 150 MG PO TABS: 150.0000 mg | ORAL_TABLET | Freq: Every day | ORAL | 0 refills | Status: DC

## 2019-11-26 MED ORDER — NAPROXEN 500 MG PO TABS: 500.0000 mg | ORAL_TABLET | Freq: Two times a day (BID) | ORAL | 0 refills | Status: DC

## 2019-11-26 MED ORDER — NEOMYCIN-POLYMYXIN-HC 3.5-10000-1 OT SUSP: 4.0000 [drp] | Freq: Three times a day (TID) | OTIC | 0 refills | Status: DC

## 2019-11-26 NOTE — ED Provider Notes (Signed)
[place]    CSN: [id number] Arrival date & time: 11/26/19  1708      History   Chief Complaint Chief Complaint  Patient presents with  . Vaginitis  . Otalgia  . Possible Pregnancy    HPI Monique Schmidt is a 34 y.o. female.   HPI  I just saw Ms. Hoffert for STD check 3 days ago.  She also had Candida intertrigo.  Back pain.  She had been treated for chlamydia but threw up her azithromycin.  She desired alternate treatment.  Was given doxycycline and Zofran. Today she is here complaining of a yeast infection.  She would like pregnancy testing. She has a new complaint of ear pain.  This is been going on for the last couple of days.  No drainage.  No change in hearing.  No cold symptoms.  Past Medical History:  Diagnosis Date  . Abnormal Pap smear   . Bacterial vaginosis   . Gestational diabetes    likely pre-exisiting; all pregnancies glyburide started 11/4  . Gestational thrombocytopenia ([place])   . Headache(784.0)   . Hernia, umbilical   . History of abnormal cervical Pap smear    normal 2014  . Mild preeclampsia delivered 01/22/2016    Patient Active Problem List   Diagnosis Date Noted  . 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 History of gestational diabetes 06/26/2019  . 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 BMI 50.0-59.9, adult ([place]) 11/09/2013  . Moderate persistent asthma 08/07/2013  . Diabetes mellitus ([place]) 04/16/2011    Past Surgical History:  Procedure Laterality Date  . UMBILICAL HERNIA REPAIR N/A 11/22/2014   Procedure: LAPAROSCOPIC UMBILICAL HERNIA;  Surgeon: Ralene Ok, MD;  Location: [facility];  Service: General;  Laterality: N/A;  . WISDOM TOOTH EXTRACTION      OB History    Gravida  6   Para  5   Term  5   Preterm      AB  1   Living  5     SAB  1   TAB      Ectopic      Multiple  0   Live Births  5            Home Medications    Prior to Admission medications   Medication  Sig Start Date End Date Taking? Authorizing Provider  doxycycline (VIBRAMYCIN) 100 MG capsule Take 1 capsule (100 mg total) by mouth 2 (two) times daily. 11/23/19   Raylene Everts, MD  fluconazole (DIFLUCAN) 150 MG tablet Take 1 tablet (150 mg total) by mouth daily. Repeat in 1 week if needed 11/26/19   Raylene Everts, MD  fluticasone [facility]) 50 MCG/ACT nasal spray Place 1-2 sprays into both nostrils daily for 7 days. 04/30/19 05/07/19  Wieters, Hallie C, PA-C  naproxen (NAPROSYN) 500 MG tablet Take 1 tablet (500 mg total) by mouth 2 (two) times daily. 11/26/19   Raylene Everts, MD  neomycin-polymyxin-hydrocortisone (CORTISPORIN) 3.5-10000-1 OTIC suspension Place 4 drops into the left ear 3 (three) times daily. 11/26/19   Raylene Everts, MD  nystatin (MYCOSTATIN/NYSTOP) powder Apply 1 application topically 3 (three) times daily. 11/23/19   Raylene Everts, MD  nystatin cream (MYCOSTATIN) Apply to affected area 2 times daily 11/23/19   Raylene Everts, MD  ondansetron (ZOFRAN) 4 MG tablet Take 1 tablet (4 mg total) by mouth every 6 (six) hours. 11/23/19   Raylene Everts, MD  tiZANidine (ZANAFLEX) 4 MG tablet Take 1-2 tablets (4-8 mg total) by mouth every 6 (six) hours as needed for muscle spasms. 11/23/19   Eustace Moore, MD  medroxyPROGESTERone (PROVERA) 10 MG tablet Take 1 tablet (10 mg total) by mouth daily. 06/24/19 08/18/19  Reva Bores, MD  metFORMIN (GLUCOPHAGE) 500 MG tablet Take 1 tablet (500 mg total) by mouth 2 (two) times daily with a meal. 06/25/19 10/30/19  Reva Bores, MD    Family History Family History  Problem Relation Age of Onset  . Hypertension Father   . Diabetes Father   . Diabetes Mother   . Seizures Brother     Social History Social History   Tobacco Use  . Smoking status: Never Smoker  . Smokeless tobacco: Never Used  Substance Use Topics  . Alcohol use: No  . Drug use: No     Allergies   Latex and Penicillins   Review of  Systems Review of Systems  Constitutional: Negative for chills and fever.  HENT: Positive for ear pain. Negative for congestion and ear discharge.   Genitourinary: Positive for menstrual problem and vaginal discharge.     Physical Exam Triage Vital Signs ED Triage Vitals  Enc Vitals Group     BP 11/26/19 1721 (!) 136/95     Pulse Rate 11/26/19 1721 89     Resp 11/26/19 1721 18     Temp 11/26/19 1721 98.5 F (36.9 C)     Temp Source 11/26/19 1721 Oral     SpO2 11/26/19 1721 98 %     Weight --      Height --      Head Circumference --      Peak Flow --      Pain Score 11/26/19 1719 5     Pain Loc --      Pain Edu? --      Excl. in GC? --    No data found.  Updated Vital Signs BP (!) 136/95 (BP Location: Right Arm)   Pulse 89   Temp 98.5 F (36.9 C) (Oral)   Resp 18   LMP 10/24/2019   SpO2 98%      Physical Exam Constitutional:      General: She is not in acute distress.    Appearance: She is well-developed. She is obese. She is not ill-appearing.  HENT:     Head: Normocephalic and atraumatic.     Right Ear: Tympanic membrane and ear canal normal.     Left Ear: Tympanic membrane, ear canal and external ear normal.     Ears:     Comments: Right canal with swelling and discharge Eyes:     Conjunctiva/sclera: Conjunctivae normal.     Pupils: Pupils are equal, round, and reactive to light.  Cardiovascular:     Rate and Rhythm: Normal rate.  Pulmonary:     Effort: Pulmonary effort is normal. No respiratory distress.  Abdominal:     General: There is no distension.     Palpations: Abdomen is soft.     Comments: Large pannus  Genitourinary:    Comments: Vulva swollen and erythematous Musculoskeletal:        General: Normal range of motion.     Cervical back: Normal range of motion.     Comments: Still with tenderness and limited ROM  Skin:    General: Skin is warm and dry.  Neurological:     General: No focal deficit present.  Mental Status: She is  alert.  Psychiatric:        Mood and Affect: Mood normal.        Behavior: Behavior normal.      UC Treatments / Results  Labs (all labs ordered are listed, but only abnormal results are displayed) Labs Reviewed  POC URINE PREG, ED  POCT PREGNANCY, URINE  POC URINE PREG, ED    EKG   Radiology No results found.  Procedures Procedures (including critical care time)  Medications Ordered in UC Medications - No data to display  Initial Impression / Assessment and Plan / UC Course  I have reviewed the triage vital signs and the nursing notes.  Pertinent labs & imaging results that were available during my care of the patient were reviewed by me and considered in my medical decision making (see chart for details).     Patient stated she wants stronger pain medication.  Initially I offered to prescribe this for her.  When I reviewed the [facility] narcotic database I saw that she takes oxycodone 3 times a day by another provider.  This looks like it is from a pain clinic.  It is not appropriate to give her additional narcotics.  I will switch her ibuprofen to naproxen.  Continue muscle relaxers and rest.  Follow-up with pain provider as scheduled Final Clinical Impressions(s) / UC Diagnoses   Final diagnoses:  Otitis, externa, infective left  Ear pain, left  Acute vulvovaginitis  Acute bilateral low back pain without sciatica     Discharge Instructions     Stop ibuprofen Take Naprosyn 500 mg 2 times a day Continue your oxycodone for pain Use the eardrops until the ear pain improves Take Diflucan for the yeast infection.  Repeat if needed in 3 days See your primary care for follow-up    ED Prescriptions    Medication Sig Dispense Auth. Provider   neomycin-polymyxin-hydrocortisone (CORTISPORIN) 3.5-10000-1 OTIC suspension Place 4 drops into the left ear 3 (three) times daily. 10 mL Eustace Moore, MD   fluconazole (DIFLUCAN) 150 MG tablet Take 1 tablet (150  mg total) by mouth daily. Repeat in 1 week if needed 2 tablet Eustace Moore, MD   naproxen (NAPROSYN) 500 MG tablet Take 1 tablet (500 mg total) by mouth 2 (two) times daily. 30 tablet Eustace Moore, MD     I have reviewed the PDMP during this encounter.   Eustace Moore, MD 11/26/19 [phone]

## 2019-11-26 NOTE — ED Triage Notes (Signed)
Pt presents with vaginal irritation & itching.  Pt also presents with left ear pain and possible pregnancy .

## 2019-11-26 NOTE — Discharge Instructions (Signed)
Stop ibuprofen Take Naprosyn 500 mg 2 times a day Continue your oxycodone for pain Use the eardrops until the ear pain improves Take Diflucan for the yeast infection.  Repeat if needed in 3 days See your primary care for follow-up

## 2019-11-30 ENCOUNTER — Encounter: Payer: Self-pay | Admitting: Student

## 2019-11-30 ENCOUNTER — Other Ambulatory Visit: Payer: Self-pay

## 2019-11-30 ENCOUNTER — Ambulatory Visit (INDEPENDENT_AMBULATORY_CARE_PROVIDER_SITE_OTHER): Payer: Medicaid Other | Admitting: Student

## 2019-11-30 ENCOUNTER — Ambulatory Visit (INDEPENDENT_AMBULATORY_CARE_PROVIDER_SITE_OTHER): Payer: Medicaid Other | Admitting: General Practice

## 2019-11-30 ENCOUNTER — Telehealth: Payer: Self-pay

## 2019-11-30 ENCOUNTER — Other Ambulatory Visit (HOSPITAL_COMMUNITY)
Admission: RE | Admit: 2019-11-30 | Discharge: 2019-11-30 | Disposition: A | Discharge status: Home / Self Care | Payer: Medicaid Other | Source: Ambulatory Visit | Attending: Student | Admitting: Student

## 2019-11-30 VITALS — BP 112/77 | HR 99 | Temp 98.4°F | Wt 310.0 lb

## 2019-11-30 DIAGNOSIS — B3731 Acute candidiasis of vulva and vagina: Secondary | ICD-10-CM

## 2019-11-30 DIAGNOSIS — B373 Candidiasis of vulva and vagina: Secondary | ICD-10-CM

## 2019-11-30 DIAGNOSIS — N898 Other specified noninflammatory disorders of vagina: Secondary | ICD-10-CM

## 2019-11-30 DIAGNOSIS — R102 Pelvic and perineal pain: Secondary | ICD-10-CM

## 2019-11-30 DIAGNOSIS — E1165 Type 2 diabetes mellitus with hyperglycemia: Secondary | ICD-10-CM | POA: Diagnosis present

## 2019-11-30 MED ORDER — FLUCONAZOLE 150 MG PO TABS: ORAL_TABLET | ORAL | 0 refills | Status: DC

## 2019-11-30 MED ORDER — METFORMIN HCL ER 500 MG PO TB24: ORAL_TABLET | ORAL | 1 refills | Status: DC

## 2019-11-30 MED ORDER — NYSTATIN-TRIAMCINOLONE 100000-0.1 UNIT/GM-% EX OINT: 1.0000 "application " | TOPICAL_OINTMENT | Freq: Two times a day (BID) | CUTANEOUS | 0 refills | Status: DC

## 2019-11-30 NOTE — Progress Notes (Signed)
t

## 2019-11-30 NOTE — Telephone Encounter (Signed)
Called patient to advise that I would be scheduling her an ultrasound and that I changed her appointment to see a provider to come into the office for a swab with a nurse. she stated that she was ok with the ultrasound and that she was in a lot of pain and that it needed to be soon so that she can finally figure out what the pain was coming from. Stated that she had some clumpy yeast and the 1 pill that was prescribed was not helpful and would rather someone come take a look at it. Advised of u/s appointment at [facility] long patient verbalized understanding and ended the call.

## 2019-11-30 NOTE — Progress Notes (Signed)
Patient presents to office today reporting severe vaginal itching and burning since last Monday. She also notes a fishy odor. Patient reports +chlamydia last month and was retreated on Monday 4/5 at Urgent Care. She states she went back to Urgent Care on Wednesday for a severe yeast infection. She has taken one Diflucan- was given second pill to take a week later. She has also used Monistat over the counter externally and internally, but neither medication has helped. She reports her labia are raw/red. She also notes irritation/burning with urination because she is so sensitive. Per chart review, patient has recent diagnosis of type 2 diabetes with A1C of 12. Patient also hasn't had recent swab. Discussed self swab for yeast culture to ensure proper medication has been given. Advised her to go ahead and take second diflucan and could also give Rx for Terazol for external use. Patient wasn't aware visit was only nurse visit and requests visit with a provider today- feels someone needs to take a look. Offered appt at 2pm today- patient verbalized understanding.   Chase Caller RN BSN 11/30/19

## 2019-12-01 ENCOUNTER — Encounter: Payer: Self-pay | Admitting: Student

## 2019-12-01 LAB — CERVICOVAGINAL ANCILLARY ONLY
Candida Glabrata: NEGATIVE
Candida Vaginitis: NEGATIVE
Comment: NEGATIVE
Comment: NEGATIVE

## 2019-12-01 NOTE — Progress Notes (Signed)
History:  [name] is a 34 y.o. [id number] who presents to clinic today for vaginal discharge & irritation. Symptoms started a week ago & have worsened over time. Went to urgent care last week for pelvic pain - was retreated for chlamydia & prescribed diflucan for suspected yeast at that time. Took 1 dose of diflucan & has been using monistat over the counter without relief of symptoms. She has an outpatient ultrasound & visit with Dr. Shawnie Pons later this week - being followed for AUB & pelvic pain.  She has diabetes - last A1C was 12.8 in January. She has not established care with a PCP. Was prescribed metformin by Dr. Shawnie Pons but doesn't take it due to GI side effects.    Patient Active Problem List   Diagnosis Date Noted  . 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 History of gestational diabetes 06/26/2019  . 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 BMI 50.0-59.9, adult ([facility]) 11/09/2013  . Moderate persistent asthma 08/07/2013  . Diabetes mellitus ([facility]) 04/16/2011    Allergies  Allergen Reactions  . Latex Itching and Rash  . Penicillins Itching and Rash    Has patient had a PCN reaction causing immediate rash, facial/tongue/throat swelling, SOB or lightheadedness with hypotension: Yes Has patient had a PCN reaction causing severe rash involving mucus membranes or skin necrosis: Yes Has patient had a PCN reaction that required hospitalization No Has patient had a PCN reaction occurring within the last 10 years: Yes If all of the above answers are "NO", then may proceed with Cephalosporin use.     Current Outpatient Medications on File Prior to Visit  Medication Sig Dispense Refill  . doxycycline (VIBRAMYCIN) 100 MG capsule Take 1 capsule (100 mg total) by mouth 2 (two) times daily. 14 capsule 0  . ibuprofen (ADVIL) 600 MG tablet Take 600 mg by mouth.    . naproxen (NAPROSYN) 500 MG tablet Take 1 tablet (500 mg total) by mouth 2 (two) times daily. 30  tablet 0  . nystatin (MYCOSTATIN/NYSTOP) powder Apply 1 application topically 3 (three) times daily. 15 g 0  . nystatin cream (MYCOSTATIN) Apply to affected area 2 times daily 30 g 0  . ondansetron (ZOFRAN) 4 MG tablet Take 1 tablet (4 mg total) by mouth every 6 (six) hours. 12 tablet 0  . tiZANidine (ZANAFLEX) 4 MG tablet Take 1-2 tablets (4-8 mg total) by mouth every 6 (six) hours as needed for muscle spasms. 21 tablet 0  . fluticasone (FLONASE) 50 MCG/ACT nasal spray Place 1-2 sprays into both nostrils daily for 7 days. 1 g 0  . neomycin-polymyxin-hydrocortisone (CORTISPORIN) 3.5-10000-1 OTIC suspension Place 4 drops into the left ear 3 (three) times daily. (Patient not taking: Reported on 11/30/2019) 10 mL 0  . [DISCONTINUED] medroxyPROGESTERone (PROVERA) 10 MG tablet Take 1 tablet (10 mg total) by mouth daily. 10 tablet 3   No current facility-administered medications on file prior to visit.     The following portions of the patient's history were reviewed and updated as appropriate: allergies, current medications, family history, past medical history, social history, past surgical history and problem list.  Review of Systems:  Other than those mentioned in HPI all ROS negative   Objective:  Physical Exam BP 112/77   Pulse 99   Temp 98.4 F (36.9 C)   Wt (!) 310 lb (140.6 kg)   LMP 11/07/2019   BMI 50.04 kg/m  CONSTITUTIONAL: Well-developed, well-nourished female in no acute distress.  EYES: EOM intact, conjunctivae normal, no scleral icterus HEAD: Normocephalic, atraumatic RESPIRATORY:Effort and breath sounds normal, no problems with respiration noted. GENITOURINARY: Speculum exam deferred due to patient discomfort. Moderate amount of green tinged clumpy discharge at introitus. Bilateral vulva excoriated from clitoral region to rectum. Fissure at right perineum.  PSYCHIATRIC: Normal mood and affect. Normal behavior. Normal judgment and thought content.   Assessment & Plan:   1. Type 2 diabetes mellitus with hyperglycemia, without long-term current use of insulin ([facility]) -patient agreeable to trying metformin XR for treatment of diabetes in hopes of fewer GI side effects. Stressed importance of better BS control in order to avoid future yeast infections & for her general health. Referred to primary care for further management of diabetes & other possible comorbidities.   - fluconazole (DIFLUCAN) 150 MG tablet; Every 72 hours x 3 doses. Then weekly for 8 weeks.  Dispense: 11 tablet; Refill: 0 - nystatin-triamcinolone ointment (MYCOLOG); Apply 1 application topically 2 (two) times daily.  Dispense: 30 g; Refill: 0 - metFORMIN (GLUCOPHAGE-XR) 500 MG 24 hr tablet; Take 1 tablet with breakfast for 1 week. Then take 2 tablets with breakfast daily.  Dispense: 60 tablet; Refill: 1 - Ambulatory referral to Internal Medicine  2. Yeast vaginitis -likely due to poor control of diabetes & recent antibiotics. Will try 3 doses of diflucan & mycolog for external symptoms   - fluconazole (DIFLUCAN) 150 MG tablet; Every 72 hours x 3 doses. Then weekly for 8 weeks.  Dispense: 11 tablet; Refill: 0 - nystatin-triamcinolone ointment (MYCOLOG); Apply 1 application topically 2 (two) times daily.  Dispense: 30 g; Refill: 0   F/u with Dr. Kennon Rounds later this week as scheduled   Jorje Guild, NP 12/01/2019 8:03 AM

## 2019-12-02 ENCOUNTER — Ambulatory Visit: Payer: Medicaid Other | Admitting: Family Medicine

## 2019-12-02 ENCOUNTER — Ambulatory Visit (HOSPITAL_COMMUNITY): Payer: Medicaid Other

## 2019-12-10 ENCOUNTER — Ambulatory Visit (HOSPITAL_COMMUNITY)
Admission: RE | Admit: 2019-12-10 | Discharge: 2019-12-10 | Disposition: A | Discharge status: Home / Self Care | Payer: Medicaid Other | Source: Ambulatory Visit | Attending: Obstetrics and Gynecology | Admitting: Obstetrics and Gynecology

## 2019-12-10 ENCOUNTER — Other Ambulatory Visit: Payer: Self-pay

## 2019-12-10 DIAGNOSIS — R102 Pelvic and perineal pain: Secondary | ICD-10-CM

## 2019-12-14 ENCOUNTER — Ambulatory Visit (HOSPITAL_COMMUNITY)
Admission: EM | Admit: 2019-12-14 | Discharge: 2019-12-14 | Disposition: A | Discharge status: Home / Self Care | Payer: Medicaid Other | Attending: Family Medicine | Admitting: Family Medicine

## 2019-12-14 ENCOUNTER — Other Ambulatory Visit: Payer: Self-pay

## 2019-12-14 DIAGNOSIS — Z3202 Encounter for pregnancy test, result negative: Secondary | ICD-10-CM | POA: Diagnosis not present

## 2019-12-14 DIAGNOSIS — G8929 Other chronic pain: Secondary | ICD-10-CM | POA: Diagnosis not present

## 2019-12-14 DIAGNOSIS — M545 Low back pain: Secondary | ICD-10-CM | POA: Diagnosis not present

## 2019-12-14 LAB — POCT URINALYSIS DIP (DEVICE)
Bilirubin Urine: NEGATIVE
Glucose, UA: 100 mg/dL — AB
Ketones, ur: NEGATIVE mg/dL
Leukocytes,Ua: NEGATIVE
Nitrite: NEGATIVE
Protein, ur: NEGATIVE mg/dL
Specific Gravity, Urine: >=1.03 (ref 1.005–1.030)
Urobilinogen, UA: 0.2 mg/dL (ref 0.0–1.0)
pH: 5.5 (ref 5.0–8.0)

## 2019-12-14 LAB — POC URINE PREG, ED: Preg Test, Ur: NEGATIVE

## 2019-12-14 MED ORDER — NAPROXEN 500 MG PO TABS: 500.0000 mg | ORAL_TABLET | Freq: Two times a day (BID) | ORAL | 0 refills | Status: DC

## 2019-12-14 MED ORDER — TIZANIDINE HCL 4 MG PO TABS: 4.0000 mg | ORAL_TABLET | Freq: Four times a day (QID) | ORAL | 0 refills | Status: DC | PRN

## 2019-12-14 NOTE — ED Triage Notes (Signed)
R flank pain x 1 month

## 2019-12-14 NOTE — Discharge Instructions (Addendum)
Take the naproxen 2 x a day with food Take the muscle relaxer tizanidine as needed  CALL TOMORROW: Internal medicine for appointment to treat diabetes Sport medicine for appointment to treat back

## 2019-12-15 NOTE — ED Provider Notes (Signed)
[facility]    CSN: [id number] Arrival date & time: 12/14/19  1937      History   Chief Complaint Chief Complaint  Patient presents with  . Back Pain    HPI Monique Schmidt is a 34 y.o. female.   HPI  Patient states that she has not come back to work since her visit here on 11/23/2019.  She states she is out of work on disability.  She still has back pain.  She is here today for pain that she states that goes from her neck down to her waist.  Is been present for a month.  No coughing.  No chest congestion.  No fever or chills.  No urinary symptoms or hematuria.  Past Medical History:  Diagnosis Date  . Abnormal Pap smear   . Bacterial vaginosis   . Gestational diabetes    likely pre-exisiting; all pregnancies glyburide started 11/4  . Gestational thrombocytopenia ([facility])   . Headache(784.0)   . Hernia, umbilical   . History of abnormal cervical Pap smear    normal 2014  . Mild preeclampsia delivered 01/22/2016  . Type 2 diabetes mellitus [facility])     Patient Active Problem List   Diagnosis Date Noted  . 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 History of gestational diabetes 06/26/2019  . 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 BMI 50.0-59.9, adult ([facility]) 11/09/2013  . Moderate persistent asthma 08/07/2013  . Diabetes mellitus ([facility]) 04/16/2011    Past Surgical History:  Procedure Laterality Date  . UMBILICAL HERNIA REPAIR N/A 11/22/2014   Procedure: LAPAROSCOPIC UMBILICAL HERNIA;  Surgeon: Axel Filler, MD;  Location: [facility];  Service: General;  Laterality: N/A;  . WISDOM TOOTH EXTRACTION      OB History    Gravida  6   Para  5   Term  5   Preterm      AB  1   Living  5     SAB  1   TAB      Ectopic      Multiple  0   Live Births  5            Home Medications    Prior to Admission medications   Medication Sig Start Date End Date Taking? Authorizing Provider  fluticasone (FLONASE) 50 MCG/ACT nasal  spray Place 1-2 sprays into both nostrils daily for 7 days. 04/30/19 05/07/19  Wieters, Hallie C, PA-C  metFORMIN (GLUCOPHAGE-XR) 500 MG 24 hr tablet Take 1 tablet with breakfast for 1 week. Then take 2 tablets with breakfast daily. 11/30/19   Judeth Horn, NP  naproxen (NAPROSYN) 500 MG tablet Take 1 tablet (500 mg total) by mouth 2 (two) times daily. 12/14/19   Eustace Moore, MD  nystatin (MYCOSTATIN/NYSTOP) powder Apply 1 application topically 3 (three) times daily. 11/23/19   Eustace Moore, MD  nystatin cream (MYCOSTATIN) Apply to affected area 2 times daily 11/23/19   Eustace Moore, MD  nystatin-triamcinolone ointment [facility]) Apply 1 application topically 2 (two) times daily. 11/30/19   Judeth Horn, NP  tiZANidine (ZANAFLEX) 4 MG tablet Take 1-2 tablets (4-8 mg total) by mouth every 6 (six) hours as needed for muscle spasms. 12/14/19   Eustace Moore, MD  medroxyPROGESTERone (PROVERA) 10 MG tablet Take 1 tablet (10 mg total) by mouth daily. 06/24/19 08/18/19  Reva Bores, MD    Family History Family History  Problem Relation Age of Onset  . Hypertension  Father   . Diabetes Father   . Diabetes Mother   . Seizures Brother     Social History Social History   Tobacco Use  . Smoking status: Never Smoker  . Smokeless tobacco: Never Used  Substance Use Topics  . Alcohol use: No  . Drug use: No     Allergies   Latex and Penicillins   Review of Systems Review of Systems  Musculoskeletal: Positive for back pain.     Physical Exam Triage Vital Signs ED Triage Vitals  Enc Vitals Group     BP 12/14/19 1958 (!) 147/99     Pulse Rate 12/14/19 1958 87     Resp 12/14/19 1958 18     Temp 12/14/19 1958 98.3 F (36.8 C)     Temp src --      SpO2 12/14/19 1958 100 %     Weight --      Height --      Head Circumference --      Peak Flow --      Pain Score 12/14/19 1959 8     Pain Loc --      Pain Edu? --      Excl. in GC? --    No data found.  Updated  Vital Signs BP (!) 147/99   Pulse 87   Temp 98.3 F (36.8 C)   Resp 18   LMP  (LMP Unknown)   SpO2 100%  Physical Exam Constitutional:      General: She is not in acute distress.    Appearance: She is well-developed. She is obese.  HENT:     Head: Normocephalic and atraumatic.     Mouth/Throat:     Comments: Mask is in place Eyes:     Conjunctiva/sclera: Conjunctivae normal.     Pupils: Pupils are equal, round, and reactive to light.  Cardiovascular:     Rate and Rhythm: Normal rate.  Pulmonary:     Effort: Pulmonary effort is normal. No respiratory distress.  Abdominal:     General: There is no distension.     Palpations: Abdomen is soft.  Musculoskeletal:        General: Normal range of motion.     Cervical back: Normal range of motion.     Comments: Mild tenderness in the lower thoracic upper lumbar region.  No palpable muscle spasm.  Strength sensation range of motion and reflexes are normal in lower extremities.  Patient appears to move without difficulty  Skin:    General: Skin is warm and dry.  Neurological:     General: No focal deficit present.     Mental Status: She is alert.  Psychiatric:        Mood and Affect: Mood normal.        Behavior: Behavior normal.      UC Treatments / Results  Labs (all labs ordered are listed, but only abnormal results are displayed) Labs Reviewed  POCT URINALYSIS DIP (DEVICE) - Abnormal; Notable for the following components:      Result Value   Glucose, UA 100 (*)    Hgb urine dipstick MODERATE (*)    All other components within normal limits  POC URINE PREG, ED    EKG   Radiology No results found.  Procedures Procedures (including critical care time)  Medications Ordered in UC Medications - No data to display  Initial Impression / Assessment and Plan / UC Course  I have reviewed the triage vital signs  and the nursing notes.  Pertinent labs & imaging results that were available during my care of the patient  were reviewed by me and considered in my medical decision making (see chart for details).     I am referring patient to sports medicine for treatment of her back pain. I have not placed patient out of work since the fifth.  Giving her another few days off.  She needs to follow-up for continued disability Final Clinical Impressions(s) / UC Diagnoses   Final diagnoses:  Chronic bilateral low back pain without sciatica     Discharge Instructions     Take the naproxen 2 x a day with food Take the muscle relaxer tizanidine as needed  CALL TOMORROW: 1. Internal medicine for appointment to treat diabetes 2. Sport medicine for appointment to treat back   ED Prescriptions    Medication Sig Dispense Auth. Provider   tiZANidine (ZANAFLEX) 4 MG tablet Take 1-2 tablets (4-8 mg total) by mouth every 6 (six) hours as needed for muscle spasms. 21 tablet Raylene Everts, MD   naproxen (NAPROSYN) 500 MG tablet Take 1 tablet (500 mg total) by mouth 2 (two) times daily. 30 tablet Raylene Everts, MD     PDMP not reviewed this encounter.   Raylene Everts, MD 12/15/19 [phone]

## 2019-12-30 ENCOUNTER — Ambulatory Visit: Payer: Medicaid Other | Admitting: Family Medicine

## 2020-01-01 ENCOUNTER — Emergency Department (HOSPITAL_COMMUNITY): Payer: Medicaid Other

## 2020-01-01 ENCOUNTER — Encounter (HOSPITAL_COMMUNITY): Payer: Self-pay | Admitting: Emergency Medicine

## 2020-01-01 ENCOUNTER — Emergency Department (HOSPITAL_COMMUNITY)
Admission: EM | Admit: 2020-01-01 | Discharge: 2020-01-01 | Disposition: A | Discharge status: Home / Self Care | Payer: Medicaid Other | Attending: Emergency Medicine | Admitting: Emergency Medicine

## 2020-01-01 DIAGNOSIS — J45909 Unspecified asthma, uncomplicated: Secondary | ICD-10-CM | POA: Insufficient documentation

## 2020-01-01 DIAGNOSIS — E119 Type 2 diabetes mellitus without complications: Secondary | ICD-10-CM | POA: Insufficient documentation

## 2020-01-01 DIAGNOSIS — M5489 Other dorsalgia: Secondary | ICD-10-CM | POA: Diagnosis not present

## 2020-01-01 DIAGNOSIS — Z79899 Other long term (current) drug therapy: Secondary | ICD-10-CM | POA: Insufficient documentation

## 2020-01-01 DIAGNOSIS — Z7984 Long term (current) use of oral hypoglycemic drugs: Secondary | ICD-10-CM | POA: Diagnosis not present

## 2020-01-01 DIAGNOSIS — R109 Unspecified abdominal pain: Secondary | ICD-10-CM | POA: Diagnosis present

## 2020-01-01 DIAGNOSIS — N760 Acute vaginitis: Secondary | ICD-10-CM | POA: Diagnosis not present

## 2020-01-01 DIAGNOSIS — B9689 Other specified bacterial agents as the cause of diseases classified elsewhere: Secondary | ICD-10-CM

## 2020-01-01 LAB — URINALYSIS, ROUTINE W REFLEX MICROSCOPIC
Bilirubin Urine: NEGATIVE
Glucose, UA: 50 mg/dL — AB
Hgb urine dipstick: NEGATIVE
Ketones, ur: 5 mg/dL — AB
Leukocytes,Ua: NEGATIVE
Nitrite: NEGATIVE
Protein, ur: NEGATIVE mg/dL
Specific Gravity, Urine: 1.028 (ref 1.005–1.030)
pH: 6 (ref 5.0–8.0)

## 2020-01-01 LAB — I-STAT BETA HCG BLOOD, ED (MC, WL, AP ONLY): I-stat hCG, quantitative: <5 m[IU]/mL (ref <5)

## 2020-01-01 LAB — CBC
HCT: 46.7 % — ABNORMAL HIGH (ref 36.0–46.0)
Hemoglobin: 15.1 g/dL — ABNORMAL HIGH (ref 12.0–15.0)
MCH: 28.1 pg (ref 26.0–34.0)
MCHC: 32.3 g/dL (ref 30.0–36.0)
MCV: 86.8 fL (ref 80.0–100.0)
Platelets: 139 10*3/uL — ABNORMAL LOW (ref 150–400)
RBC: 5.38 MIL/uL — ABNORMAL HIGH (ref 3.87–5.11)
RDW: 14.7 % (ref 11.5–15.5)
WBC: 9.4 10*3/uL (ref 4.0–10.5)
nRBC: 0 % (ref 0.0–0.2)

## 2020-01-01 LAB — COMPREHENSIVE METABOLIC PANEL
ALT: 17 U/L (ref 0–44)
AST: 17 U/L (ref 15–41)
Albumin: 4 g/dL (ref 3.5–5.0)
Alkaline Phosphatase: 61 U/L (ref 38–126)
Anion gap: 11 (ref 5–15)
BUN: 7 mg/dL (ref 6–20)
CO2: 25 mmol/L (ref 22–32)
Calcium: 8.7 mg/dL — ABNORMAL LOW (ref 8.9–10.3)
Chloride: 102 mmol/L (ref 98–111)
Creatinine, Ser: 0.53 mg/dL (ref 0.44–1.00)
GFR calc Af Amer: >60 mL/min (ref >60)
GFR calc non Af Amer: >60 mL/min (ref >60)
Glucose, Bld: 251 mg/dL — ABNORMAL HIGH (ref 70–99)
Potassium: 3.2 mmol/L — ABNORMAL LOW (ref 3.5–5.1)
Sodium: 138 mmol/L (ref 135–145)
Total Bilirubin: 0.8 mg/dL (ref 0.3–1.2)
Total Protein: 7.7 g/dL (ref 6.5–8.1)

## 2020-01-01 LAB — WET PREP, GENITAL
Sperm: NONE SEEN
Trich, Wet Prep: NONE SEEN
WBC, Wet Prep HPF POC: NONE SEEN
Yeast Wet Prep HPF POC: NONE SEEN

## 2020-01-01 LAB — PREGNANCY, URINE: Preg Test, Ur: NEGATIVE

## 2020-01-01 LAB — LIPASE, BLOOD: Lipase: 20 U/L (ref 11–51)

## 2020-01-01 MED ORDER — METRONIDAZOLE 500 MG PO TABS
500.0000 mg | ORAL_TABLET | Freq: Two times a day (BID) | ORAL | 0 refills | Status: AC
Start: 2020-01-01 — End: 2020-01-08

## 2020-01-01 MED ORDER — IOHEXOL 300 MG/ML  SOLN
100.0000 mL | Freq: Once | INTRAMUSCULAR | Status: AC | PRN
  Administered 2020-01-01: 100 mL via INTRAVENOUS

## 2020-01-01 NOTE — ED Triage Notes (Signed)
Patient c/o lower back pain and lower abdominal pain with diarrhea since last night.

## 2020-01-01 NOTE — ED Notes (Signed)
MD at bedside. 

## 2020-01-01 NOTE — ED Notes (Signed)
Pt transported to CT ?

## 2020-01-01 NOTE — Discharge Instructions (Signed)
Follow-up with your primary care doctor to discuss your high blood sugars and need for ultrasound of your liver to further evaluate for possible mass or hemangioma.

## 2020-01-01 NOTE — ED Provider Notes (Signed)
[facility]-EMERGENCY DEPT Provider Note   CSN: [id number] Arrival date & time: 01/01/20  1535     History Chief Complaint  Patient presents with  . Abdominal Pain  . Back Pain    Monique Schmidt is a 34 y.o. female.  The history is provided by the patient.  Abdominal Pain Pain location:  RLQ Pain quality: aching   Pain radiates to:  Does not radiate Pain severity:  Mild Onset quality:  Gradual Timing:  Intermittent Progression:  Waxing and waning Chronicity:  New Context: recent sexual activity (recent STDs and yeast infection)   Relieved by:  Nothing Worsened by:  Nothing Ineffective treatments:  None tried Associated symptoms: vaginal discharge   Associated symptoms: no chest pain, no chills, no cough, no diarrhea, no dysuria, no fever, no hematuria, no nausea, no shortness of breath, no sore throat, no vaginal bleeding and no vomiting   Risk factors: has not had multiple surgeries and not pregnant        Past Medical History:  Diagnosis Date  . Abnormal Pap smear   . Bacterial vaginosis   . Gestational diabetes    likely pre-exisiting; all pregnancies glyburide started 11/4  . Gestational thrombocytopenia (HCC)   . Headache(784.0)   . Hernia, umbilical   . History of abnormal cervical Pap smear    normal 2014  . Mild preeclampsia delivered 01/22/2016  . Type 2 diabetes mellitus [facility])     Patient Active Problem List   Diagnosis Date Noted  . 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 History of gestational diabetes 06/26/2019  . 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 BMI 50.0-59.9, adult (HCC) 11/09/2013  . Moderate persistent asthma 08/07/2013  . Diabetes mellitus (HCC) 04/16/2011    Past Surgical History:  Procedure Laterality Date  . UMBILICAL HERNIA REPAIR N/A 11/22/2014   Procedure: LAPAROSCOPIC UMBILICAL HERNIA;  Surgeon: Axel Filler, MD;  Location: [facility] OR;  Service: General;  Laterality: N/A;    . WISDOM TOOTH EXTRACTION       OB History    Gravida  6   Para  5   Term  5   Preterm      AB  1   Living  5     SAB  1   TAB      Ectopic      Multiple  0   Live Births  5           Family History  Problem Relation Age of Onset  . Hypertension Father   . Diabetes Father   . Diabetes Mother   . Seizures Brother     Social History   Tobacco Use  . Smoking status: Never Smoker  . Smokeless tobacco: Never Used  Substance Use Topics  . Alcohol use: No  . Drug use: No    Home Medications Prior to Admission medications   Medication Sig Start Date End Date Taking? Authorizing Provider  fluticasone (FLONASE) 50 MCG/ACT nasal spray Place 1-2 sprays into both nostrils daily for 7 days. 04/30/19 05/07/19  Wieters, Hallie C, PA-C  metFORMIN (GLUCOPHAGE-XR) 500 MG 24 hr tablet Take 1 tablet with breakfast for 1 week. Then take 2 tablets with breakfast daily. 11/30/19   Judeth Horn, NP  metroNIDAZOLE (FLAGYL) 500 MG tablet Take 1 tablet (500 mg total) by mouth 2 (two) times daily for 7 days. 01/01/20 01/08/20  Keaten Mashek, DO  naproxen (NAPROSYN) 500 MG tablet Take 1 tablet (500  mg total) by mouth 2 (two) times daily. 12/14/19   Eustace Moore, MD  nystatin (MYCOSTATIN/NYSTOP) powder Apply 1 application topically 3 (three) times daily. 11/23/19   Eustace Moore, MD  nystatin cream (MYCOSTATIN) Apply to affected area 2 times daily 11/23/19   Eustace Moore, MD  nystatin-triamcinolone ointment [facility]) Apply 1 application topically 2 (two) times daily. 11/30/19   Judeth Horn, NP  tiZANidine (ZANAFLEX) 4 MG tablet Take 1-2 tablets (4-8 mg total) by mouth every 6 (six) hours as needed for muscle spasms. 12/14/19   Eustace Moore, MD  medroxyPROGESTERone (PROVERA) 10 MG tablet Take 1 tablet (10 mg total) by mouth daily. 06/24/19 08/18/19  Reva Bores, MD    Allergies    Latex and Penicillins  Review of Systems   Review of Systems  Constitutional:  Negative for chills and fever.  HENT: Negative for ear pain and sore throat.   Eyes: Negative for pain and visual disturbance.  Respiratory: Negative for cough and shortness of breath.   Cardiovascular: Negative for chest pain and palpitations.  Gastrointestinal: Positive for abdominal pain. Negative for diarrhea, nausea and vomiting.  Genitourinary: Positive for vaginal discharge. Negative for decreased urine volume, difficulty urinating, dysuria, hematuria, menstrual problem, urgency, vaginal bleeding and vaginal pain.  Musculoskeletal: Negative for arthralgias and back pain.  Skin: Negative for color change and rash.  Neurological: Negative for seizures and syncope.  All other systems reviewed and are negative.   Physical Exam Updated Vital Signs BP (!) 135/97   Pulse 78   Temp 97.9 F (36.6 C)   Resp 16   LMP  (LMP Unknown)   SpO2 99%   Physical Exam Vitals and nursing note reviewed.  Constitutional:      General: She is not in acute distress.    Appearance: She is well-developed.  HENT:     Head: Normocephalic and atraumatic.     Mouth/Throat:     Mouth: Mucous membranes are moist.  Eyes:     Extraocular Movements: Extraocular movements intact.     Conjunctiva/sclera: Conjunctivae normal.     Pupils: Pupils are equal, round, and reactive to light.  Cardiovascular:     Rate and Rhythm: Normal rate and regular rhythm.     Heart sounds: Normal heart sounds. No murmur.  Pulmonary:     Effort: Pulmonary effort is normal. No respiratory distress.     Breath sounds: Normal breath sounds.  Abdominal:     Palpations: Abdomen is soft.     Tenderness: There is abdominal tenderness in the right lower quadrant. There is no right CVA tenderness, left CVA tenderness, guarding or rebound. Negative signs include Murphy's sign, Rovsing's sign, McBurney's sign and psoas sign.  Genitourinary:    Vagina: Vaginal discharge present.     Cervix: No cervical motion tenderness, discharge or  friability.     Uterus: Normal.      Adnexa: Right adnexa normal and left adnexa normal.       Right: No mass or tenderness.         Left: No mass or tenderness.    Musculoskeletal:     Cervical back: Neck supple.  Skin:    General: Skin is warm and dry.     Capillary Refill: Capillary refill takes less than 2 seconds.  Neurological:     General: No focal deficit present.     Mental Status: She is alert.     ED Results / Procedures / Treatments  Labs (all labs ordered are listed, but only abnormal results are displayed) Labs Reviewed  WET PREP, GENITAL - Abnormal; Notable for the following components:      Result Value   Clue Cells Wet Prep HPF POC PRESENT (*)    All other components within normal limits  COMPREHENSIVE METABOLIC PANEL - Abnormal; Notable for the following components:   Potassium 3.2 (*)    Glucose, Bld 251 (*)    Calcium 8.7 (*)    All other components within normal limits  CBC - Abnormal; Notable for the following components:   RBC 5.38 (*)    Hemoglobin 15.1 (*)    HCT 46.7 (*)    Platelets 139 (*)    All other components within normal limits  URINALYSIS, ROUTINE W REFLEX MICROSCOPIC - Abnormal; Notable for the following components:   APPearance HAZY (*)    Glucose, UA 50 (*)    Ketones, ur 5 (*)    All other components within normal limits  LIPASE, BLOOD  PREGNANCY, URINE  I-STAT BETA HCG BLOOD, ED ([facility], [facility], [facility] ONLY)  GC/CHLAMYDIA PROBE AMP ([place]) NOT AT [facility]    EKG None  Radiology CT ABDOMEN PELVIS W CONTRAST  Result Date: 01/01/2020 CLINICAL DATA:  Right lower quadrant pain and abdominal distension EXAM: CT ABDOMEN AND PELVIS WITH CONTRAST TECHNIQUE: Multidetector CT imaging of the abdomen and pelvis was performed using the standard protocol following bolus administration of intravenous contrast. CONTRAST:  144mL OMNIPAQUE IOHEXOL 300 MG/ML  SOLN COMPARISON:  None. FINDINGS: Lower chest: No acute abnormality. Hepatobiliary: Gallbladder  is within normal limits. The liver is homogeneous in attenuation with the exception of a 3.8 x 2.9 cm hypodensity in the posterior aspect of the right lobe of the liver. This likely represents a focal hemangioma. Pancreas: Unremarkable. No pancreatic ductal dilatation or surrounding inflammatory changes. Spleen: Normal in size without focal abnormality. Adrenals/Urinary Tract: Adrenal glands are within normal limits. Kidneys are well visualized within normal enhancement pattern. No renal calculi or obstructive changes are noted. The bladder is decompressed. Stomach/Bowel: Diverticular change of the colon is noted. Appendix is within normal limits. No obstructive or inflammatory changes of large or small bowel are noted. The stomach is within normal limits. Vascular/Lymphatic: No significant vascular findings are present. No enlarged abdominal or pelvic lymph nodes. Reproductive: Uterus is within normal limits. The ovaries demonstrate mild cystic change bilaterally. No dominant cyst is seen. Other: No abdominal wall hernia or abnormality. No abdominopelvic ascites. Musculoskeletal: No acute or significant osseous findings. IMPRESSION: Diverticulosis without diverticulitis. Normal-appearing appendix. Hypodensity within the posterior aspect of the right lobe of the liver likely representing hemangioma. Nonemergent ultrasound is recommended for further evaluation. Electronically Signed   By: Inez Catalina M.D.   On: 01/01/2020 22:03    Procedures Procedures (including critical care time)  Medications Ordered in ED Medications  iohexol (OMNIPAQUE) 300 MG/ML solution 100 mL (100 mLs Intravenous Contrast Given 01/01/20 2145)    ED Course  I have reviewed the triage vital signs and the nursing notes.  Pertinent labs & imaging results that were available during my care of the patient were reviewed by me and considered in my medical decision making (see chart for details).    MDM Rules/Calculators/A&P                       SWAYZEE WADLEY is a 34 year old female with history of diabetes who presents to the ED with lower right-sided abdominal pain  on and off the last several weeks.  Patient with unremarkable vitals.  No fever.  Recently had STDs and yeast infection.  Still having some vaginal discharge.  No concern for STDs.  Has a negative pregnancy test here.  Tender in the right lower quadrant.  But no adnexal tenderness.  No cervical motion tenderness.  Has some discharge on exam.  Was positive for bacterial vaginosis.  Negative for trichomonas.  CT scan was negative for bowel obstruction or appendicitis or ovarian cyst.  Incidental hypodensity in the liver was seen likely hemangioma and will have her follow-up outpatient for ultrasound.  History and physical not consistent with ovarian torsion, especially in the setting of no large cysts.  Blood sugar mildly elevated but otherwise lab work was unremarkable.  She is on Metformin.  Recommend follow-up with her primary care doctor for further diabetic care and for ultrasound of her liver.  Overall given reassurance and discharged in ED in good condition.  This chart was dictated using voice recognition software.  Despite best efforts to proofread,  errors can occur which can change the documentation meaning.   Final Clinical Impression(s) / ED Diagnoses Final diagnoses:  Bacterial vaginosis    Rx / DC Orders ED Discharge Orders         Ordered    metroNIDAZOLE (FLAGYL) 500 MG tablet  2 times daily     01/01/20 2218           Virgina Norfolk, DO 01/01/20 2218

## 2020-01-04 LAB — GC/CHLAMYDIA PROBE AMP (~~LOC~~) NOT AT ARMC
Chlamydia: NEGATIVE
Comment: NEGATIVE
Comment: NORMAL
Neisseria Gonorrhea: NEGATIVE

## 2020-02-02 ENCOUNTER — Emergency Department (HOSPITAL_COMMUNITY)
Admission: EM | Admit: 2020-02-02 | Discharge: 2020-02-02 | Discharge status: Against Medical Advice | Payer: Medicaid Other | Attending: Emergency Medicine | Admitting: Emergency Medicine

## 2020-02-02 ENCOUNTER — Other Ambulatory Visit: Payer: Self-pay

## 2020-02-02 ENCOUNTER — Encounter (HOSPITAL_COMMUNITY): Payer: Self-pay

## 2020-02-02 DIAGNOSIS — Z5321 Procedure and treatment not carried out due to patient leaving prior to being seen by health care provider: Secondary | ICD-10-CM | POA: Diagnosis not present

## 2020-02-02 DIAGNOSIS — R109 Unspecified abdominal pain: Secondary | ICD-10-CM | POA: Diagnosis present

## 2020-02-02 DIAGNOSIS — M549 Dorsalgia, unspecified: Secondary | ICD-10-CM | POA: Insufficient documentation

## 2020-02-02 LAB — COMPREHENSIVE METABOLIC PANEL
ALT: 21 U/L (ref 0–44)
AST: 19 U/L (ref 15–41)
Albumin: 4.1 g/dL (ref 3.5–5.0)
Alkaline Phosphatase: 60 U/L (ref 38–126)
Anion gap: 9 (ref 5–15)
BUN: 12 mg/dL (ref 6–20)
CO2: 27 mmol/L (ref 22–32)
Calcium: 9.1 mg/dL (ref 8.9–10.3)
Chloride: 96 mmol/L — ABNORMAL LOW (ref 98–111)
Creatinine, Ser: 0.72 mg/dL (ref 0.44–1.00)
GFR calc Af Amer: >60 mL/min (ref >60)
GFR calc non Af Amer: >60 mL/min (ref >60)
Glucose, Bld: 383 mg/dL — ABNORMAL HIGH (ref 70–99)
Potassium: 3.3 mmol/L — ABNORMAL LOW (ref 3.5–5.1)
Sodium: 132 mmol/L — ABNORMAL LOW (ref 135–145)
Total Bilirubin: 0.8 mg/dL (ref 0.3–1.2)
Total Protein: 7.6 g/dL (ref 6.5–8.1)

## 2020-02-02 LAB — I-STAT BETA HCG BLOOD, ED (MC, WL, AP ONLY): I-stat hCG, quantitative: <5 m[IU]/mL (ref <5)

## 2020-02-02 LAB — CBC
HCT: 41.9 % (ref 36.0–46.0)
Hemoglobin: 14 g/dL (ref 12.0–15.0)
MCH: 28.9 pg (ref 26.0–34.0)
MCHC: 33.4 g/dL (ref 30.0–36.0)
MCV: 86.4 fL (ref 80.0–100.0)
Platelets: 147 10*3/uL — ABNORMAL LOW (ref 150–400)
RBC: 4.85 MIL/uL (ref 3.87–5.11)
RDW: 14.6 % (ref 11.5–15.5)
WBC: 7.8 10*3/uL (ref 4.0–10.5)
nRBC: 0 % (ref 0.0–0.2)

## 2020-02-02 LAB — LIPASE, BLOOD: Lipase: 22 U/L (ref 11–51)

## 2020-02-02 MED ORDER — SODIUM CHLORIDE 0.9% FLUSH: 3.0000 mL | Freq: Once | INTRAVENOUS | Status: DC

## 2020-02-02 NOTE — ED Triage Notes (Signed)
Patient arrived stating she has intermittent abdominal pain that started two days ago, also reports mid back pain that comes and goes over the last month. No relief with any muscle relaxer's. Declines any urinary symptoms or NVD.

## 2020-02-03 ENCOUNTER — Other Ambulatory Visit: Payer: Self-pay

## 2020-02-03 ENCOUNTER — Encounter (HOSPITAL_COMMUNITY): Payer: Self-pay | Admitting: *Deleted

## 2020-02-03 ENCOUNTER — Emergency Department (HOSPITAL_COMMUNITY)
Admission: EM | Admit: 2020-02-03 | Discharge: 2020-02-03 | Discharge status: Against Medical Advice | Payer: Medicaid Other | Attending: Emergency Medicine | Admitting: Emergency Medicine

## 2020-02-03 DIAGNOSIS — Z5321 Procedure and treatment not carried out due to patient leaving prior to being seen by health care provider: Secondary | ICD-10-CM | POA: Diagnosis not present

## 2020-02-03 DIAGNOSIS — M25511 Pain in right shoulder: Secondary | ICD-10-CM | POA: Diagnosis not present

## 2020-02-03 NOTE — ED Notes (Signed)
Pt gave label to registration, stating she could not wait to be seen.

## 2020-02-03 NOTE — ED Triage Notes (Addendum)
Pt seen and treated yesterday for abd pain x 2 day and back pain, today states she is having some some discomfort in her right lower clavical area. Does not move or any other symptoms with complaint. She further states her pain is in her lower abd and she thinks it is related to not being able to get her Oxy which she has been on for pain. States it I has been about a month since she had any and her PCP can not write prescriptions any longer. Also she states she will need to be at work this am. She is aware of wait.

## 2020-03-20 ENCOUNTER — Encounter (HOSPITAL_COMMUNITY): Payer: Self-pay

## 2020-03-20 ENCOUNTER — Ambulatory Visit (HOSPITAL_COMMUNITY)
Admission: EM | Admit: 2020-03-20 | Discharge: 2020-03-20 | Disposition: A | Discharge status: Home / Self Care | Payer: Medicaid Other | Attending: Physician Assistant | Admitting: Physician Assistant

## 2020-03-20 ENCOUNTER — Other Ambulatory Visit: Payer: Self-pay

## 2020-03-20 DIAGNOSIS — B373 Candidiasis of vulva and vagina: Secondary | ICD-10-CM | POA: Diagnosis present

## 2020-03-20 DIAGNOSIS — R109 Unspecified abdominal pain: Secondary | ICD-10-CM | POA: Diagnosis not present

## 2020-03-20 DIAGNOSIS — E1165 Type 2 diabetes mellitus with hyperglycemia: Secondary | ICD-10-CM | POA: Diagnosis present

## 2020-03-20 DIAGNOSIS — N898 Other specified noninflammatory disorders of vagina: Secondary | ICD-10-CM | POA: Diagnosis not present

## 2020-03-20 DIAGNOSIS — B9689 Other specified bacterial agents as the cause of diseases classified elsewhere: Secondary | ICD-10-CM | POA: Diagnosis not present

## 2020-03-20 DIAGNOSIS — Z3202 Encounter for pregnancy test, result negative: Secondary | ICD-10-CM | POA: Diagnosis not present

## 2020-03-20 DIAGNOSIS — N760 Acute vaginitis: Secondary | ICD-10-CM | POA: Diagnosis not present

## 2020-03-20 DIAGNOSIS — E119 Type 2 diabetes mellitus without complications: Secondary | ICD-10-CM | POA: Diagnosis not present

## 2020-03-20 DIAGNOSIS — B3731 Acute candidiasis of vulva and vagina: Secondary | ICD-10-CM

## 2020-03-20 LAB — CBG MONITORING, ED: Glucose-Capillary: 302 mg/dL — ABNORMAL HIGH (ref 70–99)

## 2020-03-20 LAB — COMPREHENSIVE METABOLIC PANEL
ALT: 17 U/L (ref 0–44)
AST: 18 U/L (ref 15–41)
Albumin: 3.6 g/dL (ref 3.5–5.0)
Alkaline Phosphatase: 60 U/L (ref 38–126)
Anion gap: 9 (ref 5–15)
BUN: 11 mg/dL (ref 6–20)
CO2: 27 mmol/L (ref 22–32)
Calcium: 9 mg/dL (ref 8.9–10.3)
Chloride: 100 mmol/L (ref 98–111)
Creatinine, Ser: 0.69 mg/dL (ref 0.44–1.00)
GFR calc Af Amer: >60 mL/min (ref >60)
GFR calc non Af Amer: >60 mL/min (ref >60)
Glucose, Bld: 295 mg/dL — ABNORMAL HIGH (ref 70–99)
Potassium: 3.5 mmol/L (ref 3.5–5.1)
Sodium: 136 mmol/L (ref 135–145)
Total Bilirubin: 0.4 mg/dL (ref 0.3–1.2)
Total Protein: 7.2 g/dL (ref 6.5–8.1)

## 2020-03-20 LAB — POC URINE PREG, ED: Preg Test, Ur: NEGATIVE

## 2020-03-20 LAB — POCT URINALYSIS DIP (DEVICE)
Bilirubin Urine: NEGATIVE
Glucose, UA: >=1000 mg/dL — AB
Leukocytes,Ua: NEGATIVE
Nitrite: NEGATIVE
Protein, ur: NEGATIVE mg/dL
Specific Gravity, Urine: 1.025 (ref 1.005–1.030)
Urobilinogen, UA: 0.2 mg/dL (ref 0.0–1.0)
pH: 5.5 (ref 5.0–8.0)

## 2020-03-20 LAB — HEMOGLOBIN A1C
Hgb A1c MFr Bld: 11.6 % — ABNORMAL HIGH (ref 4.8–5.6)
Mean Plasma Glucose: 286.22 mg/dL

## 2020-03-20 MED ORDER — METFORMIN HCL ER 500 MG PO TB24: ORAL_TABLET | ORAL | 1 refills | Status: DC

## 2020-03-20 MED ORDER — NYSTATIN-TRIAMCINOLONE 100000-0.1 UNIT/GM-% EX OINT: 1.0000 "application " | TOPICAL_OINTMENT | Freq: Two times a day (BID) | CUTANEOUS | 0 refills | Status: DC

## 2020-03-20 MED ORDER — BLOOD GLUCOSE MONITOR KIT: PACK | 0 refills | Status: DC

## 2020-03-20 MED ORDER — METRONIDAZOLE 500 MG PO TABS
500.0000 mg | ORAL_TABLET | Freq: Two times a day (BID) | ORAL | 0 refills | Status: DC
Start: 2020-03-20 — End: 2020-04-04

## 2020-03-20 NOTE — ED Triage Notes (Signed)
Patient complains of RLQ abdominal pain with vaginal discharge, vaginal odor, and vaginal itching.

## 2020-03-20 NOTE — Discharge Instructions (Addendum)
I have sent lab tests, we will call if we need to change treatments  Take the metronidazole as prescribed  Take the metformin as prescribed, 1 tablet with breakfast for 1 week, then 2 tablets with breakfast from then on - check your sugar at least 3 times a day, if you see numbers above 400-500 , seek evaluation  You need to establish with a primary care provider, call the internal medicine center tomorrow and take the first available appointment  If belly pain gets severe, report to the emergency department

## 2020-03-20 NOTE — ED Provider Notes (Signed)
[facility]    CSN: [id number] Arrival date & time: 03/20/20  1658      History   Chief Complaint Chief Complaint  Patient presents with  . Abdominal Pain  . Vaginal Discharge  . Vaginal Itching    HPI Monique Schmidt is a 34 y.o. female.   Patient with history of diabetes Reports for vaginal discharge and right-sided abdominal pain.  Symptoms started 2 days ago.  She noticed a white discharge.  She has also had some itching and irritation.  She reports pain has been up to a 7-8 out of 10.  It is not constant.  She reports she is had some diarrhea.  Little bit of nausea but no vomiting.  She denies painful urination, frequency or urgency.  No blood in her urine.  She has been seen in the ED previously for similar.  She is not taking her Metformin.  She never checked her sugar.  She does not have a primary care provider.     Past Medical History:  Diagnosis Date  . Abnormal Pap smear   . Bacterial vaginosis   . Gestational diabetes    likely pre-exisiting; all pregnancies glyburide started 11/4  . Gestational thrombocytopenia ([place])   . Headache(784.0)   . Hernia, umbilical   . History of abnormal cervical Pap smear    normal 2014  . Mild preeclampsia delivered 01/22/2016  . Type 2 diabetes mellitus [facility])     Patient Active Problem List   Diagnosis Date Noted  . 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 History of gestational diabetes 06/26/2019  . 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 BMI 50.0-59.9, adult ([place]) 11/09/2013  . Moderate persistent asthma 08/07/2013  . Diabetes mellitus ([place]) 04/16/2011    Past Surgical History:  Procedure Laterality Date  . UMBILICAL HERNIA REPAIR N/A 11/22/2014   Procedure: LAPAROSCOPIC UMBILICAL HERNIA;  Surgeon: Ralene Ok, MD;  Location: [facility];  Service: General;  Laterality: N/A;  . WISDOM TOOTH EXTRACTION      OB History    Gravida  6   Para  5   Term  5   Preterm      AB    1   Living  5     SAB  1   TAB      Ectopic      Multiple  0   Live Births  5            Home Medications    Prior to Admission medications   Medication Sig Start Date End Date Taking? Authorizing Provider  blood glucose meter kit and supplies KIT Dispense based on patient and insurance preference. Use up to four times daily as directed. (FOR ICD-9 250.00, 250.01). 03/20/20   Unnamed Zeien, Marguerita Beards, PA-C  fluticasone (FLONASE) 50 MCG/ACT nasal spray Place 1-2 sprays into both nostrils daily for 7 days. 04/30/19 05/07/19  Wieters, Hallie C, PA-C  metFORMIN (GLUCOPHAGE-XR) 500 MG 24 hr tablet Take 1 tablet with breakfast for 1 week. Then take 2 tablets with breakfast daily. 03/20/20   Jaice Digioia, Marguerita Beards, PA-C  metroNIDAZOLE (FLAGYL) 500 MG tablet Take 1 tablet (500 mg total) by mouth 2 (two) times daily. 03/20/20   Jeanpaul Biehl, Marguerita Beards, PA-C  naproxen (NAPROSYN) 500 MG tablet Take 1 tablet (500 mg total) by mouth 2 (two) times daily. 12/14/19   Raylene Everts, MD  nystatin (MYCOSTATIN/NYSTOP) powder Apply 1 application topically 3 (three) times daily. 11/23/19  Raylene Everts, MD  nystatin cream (MYCOSTATIN) Apply to affected area 2 times daily 11/23/19   Raylene Everts, MD  nystatin-triamcinolone ointment [facility]) Apply 1 application topically 2 (two) times daily. 03/20/20   Rondy Krupinski, Marguerita Beards, PA-C  tiZANidine (ZANAFLEX) 4 MG tablet Take 1-2 tablets (4-8 mg total) by mouth every 6 (six) hours as needed for muscle spasms. 12/14/19   Raylene Everts, MD  medroxyPROGESTERone (PROVERA) 10 MG tablet Take 1 tablet (10 mg total) by mouth daily. 06/24/19 08/18/19  Donnamae Jude, MD    Family History Family History  Problem Relation Age of Onset  . Hypertension Father   . Diabetes Father   . Diabetes Mother   . Seizures Brother     Social History Social History   Tobacco Use  . Smoking status: Never Smoker  . Smokeless tobacco: Never Used  Substance Use Topics  . Alcohol use: No  . Drug use: No      Allergies   Latex and Penicillins   Review of Systems Review of Systems   Physical Exam Triage Vital Signs ED Triage Vitals  Enc Vitals Group     BP 03/20/20 1819 102/75     Pulse Rate 03/20/20 1819 88     Resp 03/20/20 1819 16     Temp 03/20/20 1819 98.6 F (37 C)     Temp src --      SpO2 03/20/20 1819 99 %     Weight --      Height --      Head Circumference --      Peak Flow --      Pain Score 03/20/20 1815 9     Pain Loc --      Pain Edu? --      Excl. in [place]? --    No data found.  Updated Vital Signs BP 102/75   Pulse 88   Temp 98.6 F (37 C)   Resp 16   SpO2 99%   Visual Acuity Right Eye Distance:   Left Eye Distance:   Bilateral Distance:    Right Eye Near:   Left Eye Near:    Bilateral Near:     Physical Exam Vitals and nursing note reviewed. Exam conducted with a chaperone present.  Constitutional:      General: She is not in acute distress.    Appearance: She is well-developed. She is not ill-appearing.  HENT:     Head: Normocephalic and atraumatic.  Eyes:     Conjunctiva/sclera: Conjunctivae normal.  Cardiovascular:     Rate and Rhythm: Normal rate and regular rhythm.     Heart sounds: No murmur heard.   Pulmonary:     Effort: Pulmonary effort is normal. No respiratory distress.     Breath sounds: Normal breath sounds. No wheezing, rhonchi or rales.  Abdominal:     General: Abdomen is protuberant. Bowel sounds are normal. There is no distension.     Palpations: Abdomen is soft.     Tenderness: There is abdominal tenderness (There is some right flank tenderness, just lateral to the umbilicus.). There is no right CVA tenderness, left CVA tenderness, guarding or rebound. Negative signs include Murphy's sign and McBurney's sign.  Genitourinary:    Comments: No vaginal wall irritation.  White/yellow discharge in the vault, foul odor.  Unable to fully visualize cervix due to body habitus.  Able to appreciate cervix on internal exam,  no cervical motion tenderness or adnexal tenderness. Musculoskeletal:  Cervical back: Neck supple.  Skin:    General: Skin is warm and dry.  Neurological:     General: No focal deficit present.     Mental Status: She is alert.      UC Treatments / Results  Labs (all labs ordered are listed, but only abnormal results are displayed) Labs Reviewed  POCT URINALYSIS DIP (DEVICE) - Abnormal; Notable for the following components:      Result Value   Glucose, UA >=1000 (*)    Ketones, ur TRACE (*)    Hgb urine dipstick MODERATE (*)    All other components within normal limits  CBG MONITORING, ED - Abnormal; Notable for the following components:   Glucose-Capillary 302 (*)    All other components within normal limits  COMPREHENSIVE METABOLIC PANEL  HEMOGLOBIN A1C  POC URINE PREG, ED  CERVICOVAGINAL ANCILLARY ONLY    EKG   Radiology No results found.  Procedures Procedures (including critical care time)  Medications Ordered in UC Medications - No data to display  Initial Impression / Assessment and Plan / UC Course  I have reviewed the triage vital signs and the nursing notes.  Pertinent labs & imaging results that were available during my care of the patient were reviewed by me and considered in my medical decision making (see chart for details).     #Bacterial vaginosis #Diabetes-uncontrolled Patient is a 34 year old presenting with vaginal discharge and abdominal discomfort.  Glucosuria on UA.  No sign of infection.  Urine pregnancy negative.  Exam consistent with possible bacterial vaginosis.  Doubt PID, as there is no cervical motion tenderness, adnexal tenderness and she is afebrile.  CBG 302, though trace ketones doubt DKA.  Will obtain CMP and A1c.  Discussed the importance of patient initiating Metformin and establishing with a primary care.  Discussed the risks of untreated diabetes.  She verbalized understanding of this.  We will start her with metronidazole  and send her vaginal swab.  Patient had requested a topical cream should use prior because she had some irritation, will oblige.  Discussed return in emergency department precautions.  Patient verbalized understanding. Final Clinical Impressions(s) / UC Diagnoses   Final diagnoses:  Bacterial vaginosis  Type 2 diabetes mellitus without complication, without long-term current use of insulin ([place])     Discharge Instructions     I have sent lab tests, we will call if we need to change treatments  Take the metronidazole as prescribed  Take the metformin as prescribed, 1 tablet with breakfast for 1 week, then 2 tablets with breakfast from then on - check your sugar at least 3 times a day, if you see numbers above 400-500 , seek evaluation  You need to establish with a primary care provider, call the internal medicine center tomorrow and take the first available appointment  If belly pain gets severe, report to the emergency department      ED Prescriptions    Medication Sig Dispense Auth. Provider   metFORMIN (GLUCOPHAGE-XR) 500 MG 24 hr tablet Take 1 tablet with breakfast for 1 week. Then take 2 tablets with breakfast daily. 60 tablet Treyten Monestime, Marguerita Beards, PA-C   nystatin-triamcinolone ointment (MYCOLOG) Apply 1 application topically 2 (two) times daily. 30 g Fotios Amos, Marguerita Beards, PA-C   metroNIDAZOLE (FLAGYL) 500 MG tablet Take 1 tablet (500 mg total) by mouth 2 (two) times daily. 14 tablet Leilanny Fluitt, Marguerita Beards, PA-C   blood glucose meter kit and supplies KIT Dispense based on patient and insurance preference.  Use up to four times daily as directed. (FOR ICD-9 250.00, 250.01). 1 each Jacklynn Dehaas, Marguerita Beards, PA-C     PDMP not reviewed this encounter.   Purnell Shoemaker, PA-C 03/20/20 1933

## 2020-03-21 LAB — CERVICOVAGINAL ANCILLARY ONLY
Bacterial Vaginitis (gardnerella): POSITIVE — AB
Candida Glabrata: NEGATIVE
Candida Vaginitis: NEGATIVE
Chlamydia: NEGATIVE
Comment: NEGATIVE
Comment: NEGATIVE
Comment: NEGATIVE
Comment: NEGATIVE
Comment: NEGATIVE
Comment: NORMAL
Neisseria Gonorrhea: NEGATIVE
Trichomonas: NEGATIVE

## 2020-03-28 ENCOUNTER — Other Ambulatory Visit: Payer: Self-pay

## 2020-03-28 ENCOUNTER — Encounter (HOSPITAL_COMMUNITY): Payer: Self-pay

## 2020-03-28 ENCOUNTER — Emergency Department (HOSPITAL_COMMUNITY): Payer: Medicaid Other

## 2020-03-28 ENCOUNTER — Emergency Department (HOSPITAL_COMMUNITY)
Admission: EM | Admit: 2020-03-28 | Discharge: 2020-03-28 | Disposition: A | Discharge status: Home / Self Care | Payer: Medicaid Other | Attending: Emergency Medicine | Admitting: Emergency Medicine

## 2020-03-28 DIAGNOSIS — Z91148 Patient's other noncompliance with medication regimen for other reason: Secondary | ICD-10-CM

## 2020-03-28 DIAGNOSIS — U071 COVID-19: Secondary | ICD-10-CM | POA: Diagnosis not present

## 2020-03-28 DIAGNOSIS — Z6841 Body Mass Index (BMI) 40.0 and over, adult: Secondary | ICD-10-CM | POA: Insufficient documentation

## 2020-03-28 DIAGNOSIS — E669 Obesity, unspecified: Secondary | ICD-10-CM | POA: Insufficient documentation

## 2020-03-28 DIAGNOSIS — J4540 Moderate persistent asthma, uncomplicated: Secondary | ICD-10-CM | POA: Diagnosis not present

## 2020-03-28 DIAGNOSIS — Z9114 Patient's other noncompliance with medication regimen: Secondary | ICD-10-CM

## 2020-03-28 DIAGNOSIS — M791 Myalgia, unspecified site: Secondary | ICD-10-CM | POA: Diagnosis not present

## 2020-03-28 DIAGNOSIS — R509 Fever, unspecified: Secondary | ICD-10-CM | POA: Diagnosis not present

## 2020-03-28 DIAGNOSIS — E1165 Type 2 diabetes mellitus with hyperglycemia: Secondary | ICD-10-CM | POA: Diagnosis not present

## 2020-03-28 DIAGNOSIS — R05 Cough: Secondary | ICD-10-CM | POA: Diagnosis present

## 2020-03-28 DIAGNOSIS — R519 Headache, unspecified: Secondary | ICD-10-CM | POA: Diagnosis not present

## 2020-03-28 DIAGNOSIS — Z7984 Long term (current) use of oral hypoglycemic drugs: Secondary | ICD-10-CM | POA: Diagnosis not present

## 2020-03-28 LAB — I-STAT BETA HCG BLOOD, ED (MC, WL, AP ONLY): I-stat hCG, quantitative: <5 m[IU]/mL (ref <5)

## 2020-03-28 LAB — DIFFERENTIAL
Abs Immature Granulocytes: 0.01 10*3/uL (ref 0.00–0.07)
Basophils Absolute: 0 10*3/uL (ref 0.0–0.1)
Basophils Relative: 0 %
Eosinophils Absolute: 0 10*3/uL (ref 0.0–0.5)
Eosinophils Relative: 0 %
Immature Granulocytes: 0 %
Lymphocytes Relative: 36 %
Lymphs Abs: 1.1 10*3/uL (ref 0.7–4.0)
Monocytes Absolute: 0.2 10*3/uL (ref 0.1–1.0)
Monocytes Relative: 6 %
Neutro Abs: 1.8 10*3/uL (ref 1.7–7.7)
Neutrophils Relative %: 58 %

## 2020-03-28 LAB — CBC
HCT: 42.5 % (ref 36.0–46.0)
Hemoglobin: 14.2 g/dL (ref 12.0–15.0)
MCH: 28.5 pg (ref 26.0–34.0)
MCHC: 33.4 g/dL (ref 30.0–36.0)
MCV: 85.3 fL (ref 80.0–100.0)
Platelets: 123 10*3/uL — ABNORMAL LOW (ref 150–400)
RBC: 4.98 MIL/uL (ref 3.87–5.11)
RDW: 14.1 % (ref 11.5–15.5)
WBC: 3.2 10*3/uL — ABNORMAL LOW (ref 4.0–10.5)
nRBC: 0 % (ref 0.0–0.2)

## 2020-03-28 LAB — CBG MONITORING, ED: Glucose-Capillary: 329 mg/dL — ABNORMAL HIGH (ref 70–99)

## 2020-03-28 LAB — BASIC METABOLIC PANEL
Anion gap: 10 (ref 5–15)
BUN: <5 mg/dL — ABNORMAL LOW (ref 6–20)
CO2: 25 mmol/L (ref 22–32)
Calcium: 8.3 mg/dL — ABNORMAL LOW (ref 8.9–10.3)
Chloride: 99 mmol/L (ref 98–111)
Creatinine, Ser: 0.64 mg/dL (ref 0.44–1.00)
GFR calc Af Amer: >60 mL/min (ref >60)
GFR calc non Af Amer: >60 mL/min (ref >60)
Glucose, Bld: 340 mg/dL — ABNORMAL HIGH (ref 70–99)
Potassium: 3.4 mmol/L — ABNORMAL LOW (ref 3.5–5.1)
Sodium: 134 mmol/L — ABNORMAL LOW (ref 135–145)

## 2020-03-28 LAB — SARS CORONAVIRUS 2 BY RT PCR (HOSPITAL ORDER, PERFORMED IN ~~LOC~~ HOSPITAL LAB): SARS Coronavirus 2: POSITIVE — AB

## 2020-03-28 MED ORDER — KETOROLAC TROMETHAMINE 15 MG/ML IJ SOLN
15.0000 mg | Freq: Once | INTRAMUSCULAR | Status: AC
  Administered 2020-03-28: 15 mg via INTRAVENOUS
  Filled 2020-03-28: qty 1

## 2020-03-28 MED ORDER — BENZONATATE 100 MG PO CAPS
100.0000 mg | ORAL_CAPSULE | Freq: Three times a day (TID) | ORAL | 0 refills | Status: DC
Start: 2020-03-28 — End: 2020-05-23

## 2020-03-28 MED ORDER — SODIUM CHLORIDE 0.9 % IV BOLUS
500.0000 mL | Freq: Once | INTRAVENOUS | Status: AC
  Administered 2020-03-28: 500 mL via INTRAVENOUS

## 2020-03-28 MED ORDER — PROCHLORPERAZINE EDISYLATE 10 MG/2ML IJ SOLN
10.0000 mg | Freq: Once | INTRAMUSCULAR | Status: AC
  Administered 2020-03-28: 10 mg via INTRAVENOUS
  Filled 2020-03-28: qty 2

## 2020-03-28 MED ORDER — DIPHENHYDRAMINE HCL 50 MG/ML IJ SOLN
50.0000 mg | Freq: Once | INTRAMUSCULAR | Status: AC
  Administered 2020-03-28: 50 mg via INTRAVENOUS
  Filled 2020-03-28: qty 1

## 2020-03-28 NOTE — ED Triage Notes (Signed)
Per EMS- Patient c/o cough, fever, and headache x 2 days. Patient states she went to an UC 3 days ago and her Covid test was negative.

## 2020-03-28 NOTE — ED Provider Notes (Signed)
[facility] DEPT Provider Note   CSN: [id number] Arrival date & time: 03/28/20  1016     History Chief Complaint  Patient presents with  . Cough  . Fever  . Headache  . Hyperglycemia    Monique Schmidt is a 34 y.o. female with PMHx uncontrolled diabetes with known noncompliance who presents to the ED today with complaint of gradual onset, constant, dry cough x 2 days. Pt also complains of subjective fevers, body aches, and a headache. She reports hx of "migraines" in the past however state she typically does not have these other symptoms.  Per triage report patient went to urgent care 3 days ago and her Covid test was negative.  She however reports that she went to urgent care 8 days ago and was diagnosed with bacterial vaginosis.  Per part everywhere patient was seen in urgent care on 08/01 with complaints of vaginal discharge and right-sided abdominal pain.  Pelvic exam performed at that time, tested positive for BV.  He was also noted to be hyperglycemic at that time.  She was prescribed Metformin advised to follow-up with primary care provider, she was given information for the internal medicine center to establish care.  There is no mention of additional symptoms at this time and she was not tested for COVID-19.  She has not been vaccinated.  She states she has not been taking her Metformin as it gave her diarrhea.  States she wants to be on insulin however again has not establish care with primary care to discuss this.  Denies any recent sick contacts.   The history is provided by the patient and medical records.       Past Medical History:  Diagnosis Date  . Abnormal Pap smear   . Bacterial vaginosis   . Gestational diabetes    likely pre-exisiting; all pregnancies glyburide started 11/4  . Gestational thrombocytopenia ([place])   . Headache(784.0)   . Hernia, umbilical   . History of abnormal cervical Pap smear    normal 2014  . Mild preeclampsia  delivered 01/22/2016  . Type 2 diabetes mellitus [facility])     Patient Active Problem List   Diagnosis Date Noted  . 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 History of gestational diabetes 06/26/2019  . 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 BMI 50.0-59.9, adult ([place]) 11/09/2013  . Moderate persistent asthma 08/07/2013  . Diabetes mellitus ([place]) 04/16/2011    Past Surgical History:  Procedure Laterality Date  . UMBILICAL HERNIA REPAIR N/A 11/22/2014   Procedure: LAPAROSCOPIC UMBILICAL HERNIA;  Surgeon: Ralene Ok, MD;  Location: [facility];  Service: General;  Laterality: N/A;  . WISDOM TOOTH EXTRACTION       OB History    Gravida  6   Para  5   Term  5   Preterm      AB  1   Living  5     SAB  1   TAB      Ectopic      Multiple  0   Live Births  5           Family History  Problem Relation Age of Onset  . Hypertension Father   . Diabetes Father   . Diabetes Mother   . Seizures Brother     Social History   Tobacco Use  . Smoking status: Never Smoker  . Smokeless tobacco: Never Used  Vaping Use  . Vaping Use: Never  used  Substance Use Topics  . Alcohol use: No  . Drug use: No    Home Medications Prior to Admission medications   Medication Sig Start Date End Date Taking? Authorizing Provider  acetaminophen (TYLENOL) 500 MG tablet Take 1,000 mg by mouth every 6 (six) hours as needed for mild pain or headache.   Yes [provider]  metroNIDAZOLE (FLAGYL) 500 MG tablet Take 1 tablet (500 mg total) by mouth 2 (two) times daily. 03/20/20  Yes Darr, Marguerita Beards, PA-C  benzonatate (TESSALON) 100 MG capsule Take 1 capsule (100 mg total) by mouth every 8 (eight) hours. 03/28/20   Alroy Bailiff, Meshach Perry, PA-C  blood glucose meter kit and supplies KIT Dispense based on patient and insurance preference. Use up to four times daily as directed. (FOR ICD-9 250.00, 250.01). 03/20/20   Darr, Marguerita Beards, PA-C  metFORMIN (GLUCOPHAGE-XR) 500 MG 24 hr  tablet Take 1 tablet with breakfast for 1 week. Then take 2 tablets with breakfast daily. 03/20/20   Darr, Marguerita Beards, PA-C  naproxen (NAPROSYN) 500 MG tablet Take 1 tablet (500 mg total) by mouth 2 (two) times daily. Patient not taking: Reported on 03/28/2020 12/14/19   Raylene Everts, MD  nystatin (MYCOSTATIN/NYSTOP) powder Apply 1 application topically 3 (three) times daily. Patient not taking: Reported on 03/28/2020 11/23/19   Raylene Everts, MD  nystatin cream (MYCOSTATIN) Apply to affected area 2 times daily Patient not taking: Reported on 03/28/2020 11/23/19   Raylene Everts, MD  nystatin-triamcinolone ointment [facility]) Apply 1 application topically 2 (two) times daily. Patient not taking: Reported on 03/28/2020 03/20/20   Darr, Marguerita Beards, PA-C  tiZANidine (ZANAFLEX) 4 MG tablet Take 1-2 tablets (4-8 mg total) by mouth every 6 (six) hours as needed for muscle spasms. Patient not taking: Reported on 03/28/2020 12/14/19   Raylene Everts, MD  medroxyPROGESTERone (PROVERA) 10 MG tablet Take 1 tablet (10 mg total) by mouth daily. 06/24/19 08/18/19  Donnamae Jude, MD    Allergies    Latex and Penicillins  Review of Systems   Review of Systems  Constitutional: Positive for fever (subjective). Negative for chills.  Eyes: Negative for visual disturbance.  Respiratory: Positive for cough. Negative for shortness of breath.   Cardiovascular: Negative for chest pain.  Gastrointestinal: Negative for abdominal pain.  Musculoskeletal: Positive for myalgias. Negative for neck pain and neck stiffness.  Neurological: Positive for headaches.  All other systems reviewed and are negative.   Physical Exam Updated Vital Signs BP 109/77   Pulse 80   Temp 98.8 F (37.1 C) (Oral)   Resp 15   Ht '5\' 6"'  (1.676 m)   Wt (!) 138.3 kg   LMP 03/21/2020   SpO2 92%   BMI 49.23 kg/m   Physical Exam Vitals and nursing note reviewed.  Constitutional:      Appearance: She is obese. She is not ill-appearing or  diaphoretic.  HENT:     Head: Normocephalic and atraumatic.  Eyes:     Conjunctiva/sclera: Conjunctivae normal.  Neck:     Meningeal: Brudzinski's sign and Kernig's sign absent.  Cardiovascular:     Rate and Rhythm: Normal rate and regular rhythm.  Pulmonary:     Effort: Pulmonary effort is normal.     Breath sounds: Normal breath sounds. No wheezing, rhonchi or rales.     Comments: Not actively coughing in the room. Able to speak in full sentences without difficulty. Satting 96% on RA.  Abdominal:  Palpations: Abdomen is soft.     Tenderness: There is no abdominal tenderness. There is no guarding.  Musculoskeletal:     Cervical back: Neck supple.  Skin:    General: Skin is warm and dry.  Neurological:     Mental Status: She is alert and oriented to person, place, and time.     GCS: GCS eye subscore is 4. GCS verbal subscore is 5. GCS motor subscore is 6.     Cranial Nerves: No cranial nerve deficit.     Sensory: No sensory deficit.     ED Results / Procedures / Treatments   Labs (all labs ordered are listed, but only abnormal results are displayed) Labs Reviewed  SARS CORONAVIRUS 2 BY RT PCR ([facility]) - Abnormal; Notable for the following components:      Result Value   SARS Coronavirus 2 POSITIVE (*)    All other components within normal limits  BASIC METABOLIC PANEL - Abnormal; Notable for the following components:   Sodium 134 (*)    Potassium 3.4 (*)    Glucose, Bld 340 (*)    BUN <5 (*)    Calcium 8.3 (*)    All other components within normal limits  CBC - Abnormal; Notable for the following components:   WBC 3.2 (*)    Platelets 123 (*)    All other components within normal limits  CBG MONITORING, ED - Abnormal; Notable for the following components:   Glucose-Capillary 329 (*)    All other components within normal limits  URINALYSIS, ROUTINE W REFLEX MICROSCOPIC  DIFFERENTIAL  CBG MONITORING, ED  I-STAT BETA  HCG BLOOD, ED ([facility], [facility], [facility] ONLY)    EKG None  Radiology DG Chest Port 1 View  Result Date: 03/28/2020 CLINICAL DATA:  Cough and fever EXAM: PORTABLE CHEST 1 VIEW COMPARISON:  July 20, 2011 FINDINGS: Lungs are clear. Heart size and pulmonary vascularity are normal. No adenopathy. No bone lesions. IMPRESSION: Lungs clear.  Cardiac silhouette within normal limits. Electronically Signed   By: Lowella Grip III M.D.   On: 03/28/2020 11:39    Procedures Procedures (including critical care time)  Medications Ordered in ED Medications  ketorolac (TORADOL) 15 MG/ML injection 15 mg (15 mg Intravenous Given 03/28/20 1138)  prochlorperazine (COMPAZINE) injection 10 mg (10 mg Intravenous Given 03/28/20 1138)  diphenhydrAMINE (BENADRYL) injection 50 mg (50 mg Intravenous Given 03/28/20 1138)  sodium chloride 0.9 % bolus 500 mL (0 mLs Intravenous Stopped 03/28/20 1341)    ED Course  I have reviewed the triage vital signs and the nursing notes.  Pertinent labs & imaging results that were available during my care of the patient were reviewed by me and considered in my medical decision making (see chart for details).  Clinical Course as of Mar 28 1354  Mon Mar 28, 2020  1318 SARS Coronavirus 2(!): POSITIVE [MV]    Clinical Course User Index [MV] Eustaquio Maize, [place]   MDM Rules/Calculators/A&P                          34 year old female who presents to the ED today with complaint of URI-like symptoms for the past 3 to 4 days.  Has not been vaccinated against Covid.  On arrival to the ED patient is afebrile, nontachycardic nontachypneic.  She appears to be in no acute distress.  She is able to speak in full sentences without difficulty and satting 96%  on room air. Given her symptoms will obtain screening labs, cxr, and test for COVID.   CBC with noted leukopenia 3.2; have added on differential  BMP with sodium 134, potassium 3.4, glucose 340. Bicarb within normal limits at 25. No gap. It does  appear at UC 1 week ago pt was noted to be hyperglycemic; provided metformin and advised to establish care with PCP. Pt states she has not been taking her metformin as it gave her diarrhea. I have instructed on importance of glucose management.   CXR clear.  COVID test has returned positive. Have ambulated patient myself; O2 sats remained above 94%. Given pt's diabetes she meets criteria for monoclonal antibodies; not currently providing at this facility in the ED however will discuss with infusion clinic so they can contact pt as she is interested.   Differential has not returned however I do not feel this will change pt's treatment course. Do not need urine at this time. Will discharge home with instructions to continue metformin and to establish care with PCP. Will provide tessalon perles for cough. Pt instructed to monitor symptoms at home and to return to the ED for any worsening symptoms. She is in agreement with plan and stable for discharge home.   This note was prepared using Dragon voice recognition software and may include unintentional dictation errors due to the inherent limitations of voice recognition software.  NEVA RAMASWAMY was evaluated in Emergency Department on 03/28/2020 for the symptoms described in the history of present illness. She was evaluated in the context of the global COVID-19 pandemic, which necessitated consideration that the patient might be at risk for infection with the SARS-CoV-2 virus that causes COVID-19. Institutional protocols and algorithms that pertain to the evaluation of patients at risk for COVID-19 are in a state of rapid change based on information released by regulatory bodies including the CDC and federal and state organizations. These policies and algorithms were followed during the patient's care in the ED.  Final Clinical Impression(s) / ED Diagnoses Final diagnoses:  FVCBS-49 virus infection  Noncompliance with medications  Type 2 diabetes  mellitus with hyperglycemia, without long-term current use of insulin ([place])    Rx / DC Orders ED Discharge Orders         Ordered    benzonatate (TESSALON) 100 MG capsule  Every 8 hours     Discontinue  Reprint     03/28/20 1353           Discharge Instructions     You  have tested POSITIVE for COVID 19 today. Please stay home and self isolate for 10 days starting today (cleared: 08/20). Continue monitoring your symptoms at home and taking OTC medication as needed including Tylenol for fevers. I have provided cough medication for you to take as needed.   Please establish care with [facility] for your diabetes - it is VERY important that you start taking the Metformin as prescribed as there can be serious lifelong consequences from uncontrolled diabetes.   Return to the ED IMMEDIATELY for any worsening symptoms including worsening shortness of breath, severe chest pain, passing out, lips turning blue, or any other new/concerning symptoms.   I have provided your information to the [facility] and they will contact you with information.          Eustaquio Maize, PA-C 03/28/20 1356    Dorie Rank, MD 03/28/20 [phone]

## 2020-03-28 NOTE — Discharge Instructions (Addendum)
You  have tested POSITIVE for COVID 19 today. Please stay home and self isolate for 10 days starting today (cleared: 08/20). Continue monitoring your symptoms at home and taking OTC medication as needed including Tylenol for fevers. I have provided cough medication for you to take as needed.   Please establish care with [facility] for your diabetes - it is VERY important that you start taking the Metformin as prescribed as there can be serious lifelong consequences from uncontrolled diabetes.   Return to the ED IMMEDIATELY for any worsening symptoms including worsening shortness of breath, severe chest pain, passing out, lips turning blue, or any other new/concerning symptoms.   I have provided your information to the [facility] center and they will contact you with information.

## 2020-03-29 ENCOUNTER — Telehealth: Payer: Self-pay | Admitting: Nurse Practitioner

## 2020-03-29 ENCOUNTER — Telehealth (HOSPITAL_COMMUNITY): Payer: Self-pay | Admitting: Nurse Practitioner

## 2020-03-29 DIAGNOSIS — U071 COVID-19: Secondary | ICD-10-CM

## 2020-03-29 NOTE — Telephone Encounter (Signed)
Called to discuss with [name] about Covid symptoms and the use of casirivimab/imdevimab, a combination monoclonal antibody infusion for those with mild to moderate Covid symptoms and at a high risk of hospitalization.     Pt is qualified for this infusion at the [facility] infusion center due to co-morbid conditions as indicated below.   Unable to reach. Voicemail left and Mychart message sent.    Patient Active Problem List   Diagnosis Date Noted  . 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 History of gestational diabetes 06/26/2019  . 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 BMI 50.0-59.9, adult ([facility]) 11/09/2013  . Moderate persistent asthma 08/07/2013  . Diabetes mellitus ([facility]) 04/16/2011    Willette Alma, AGPCNP-BC

## 2020-03-29 NOTE — Telephone Encounter (Signed)
Called to Discuss with patient about Covid symptoms and the use of regeneron, a monoclonal antibody infusion for those with mild to moderate Covid symptoms and at a high risk of hospitalization.     Pt is qualified for this infusion at the [facility] infusion center due to co-morbid conditions and/or a member of an at-risk group.     Unable to reach pt. Left message to return call. Sent mychart message.   Revonda Menter, DNP, AGNP-C [phone] (Infusion Center Hotline)  

## 2020-03-31 ENCOUNTER — Emergency Department (HOSPITAL_COMMUNITY): Payer: Medicaid Other

## 2020-03-31 ENCOUNTER — Ambulatory Visit: Payer: Self-pay | Admitting: *Deleted

## 2020-03-31 ENCOUNTER — Inpatient Hospital Stay (HOSPITAL_COMMUNITY)
Admission: EM | Admit: 2020-03-31 | Discharge: 2020-04-04 | DRG: 177 | Disposition: A | Discharge status: Home / Self Care | Payer: Medicaid Other | Attending: Family Medicine | Admitting: Family Medicine

## 2020-03-31 ENCOUNTER — Encounter (HOSPITAL_COMMUNITY): Payer: Self-pay | Admitting: Emergency Medicine

## 2020-03-31 DIAGNOSIS — R197 Diarrhea, unspecified: Secondary | ICD-10-CM | POA: Diagnosis present

## 2020-03-31 DIAGNOSIS — E876 Hypokalemia: Secondary | ICD-10-CM | POA: Diagnosis present

## 2020-03-31 DIAGNOSIS — U071 COVID-19: Secondary | ICD-10-CM | POA: Diagnosis present

## 2020-03-31 DIAGNOSIS — Z880 Allergy status to penicillin: Secondary | ICD-10-CM | POA: Diagnosis not present

## 2020-03-31 DIAGNOSIS — Z833 Family history of diabetes mellitus: Secondary | ICD-10-CM | POA: Diagnosis not present

## 2020-03-31 DIAGNOSIS — E871 Hypo-osmolality and hyponatremia: Secondary | ICD-10-CM | POA: Diagnosis not present

## 2020-03-31 DIAGNOSIS — Z91040 Latex allergy status: Secondary | ICD-10-CM | POA: Diagnosis not present

## 2020-03-31 DIAGNOSIS — J1282 Pneumonia due to coronavirus disease 2019: Secondary | ICD-10-CM | POA: Diagnosis present

## 2020-03-31 DIAGNOSIS — Z6841 Body Mass Index (BMI) 40.0 and over, adult: Secondary | ICD-10-CM | POA: Diagnosis not present

## 2020-03-31 DIAGNOSIS — Z7984 Long term (current) use of oral hypoglycemic drugs: Secondary | ICD-10-CM | POA: Diagnosis not present

## 2020-03-31 DIAGNOSIS — J9601 Acute respiratory failure with hypoxia: Secondary | ICD-10-CM | POA: Diagnosis present

## 2020-03-31 DIAGNOSIS — E1165 Type 2 diabetes mellitus with hyperglycemia: Secondary | ICD-10-CM | POA: Diagnosis present

## 2020-03-31 DIAGNOSIS — Z79899 Other long term (current) drug therapy: Secondary | ICD-10-CM | POA: Diagnosis not present

## 2020-03-31 DIAGNOSIS — D6959 Other secondary thrombocytopenia: Secondary | ICD-10-CM | POA: Diagnosis present

## 2020-03-31 LAB — CBC WITH DIFFERENTIAL/PLATELET
Abs Immature Granulocytes: 0.02 10*3/uL (ref 0.00–0.07)
Basophils Absolute: 0 10*3/uL (ref 0.0–0.1)
Basophils Relative: 0 %
Eosinophils Absolute: 0 10*3/uL (ref 0.0–0.5)
Eosinophils Relative: 0 %
HCT: 43.5 % (ref 36.0–46.0)
Hemoglobin: 14.1 g/dL (ref 12.0–15.0)
Immature Granulocytes: 1 %
Lymphocytes Relative: 31 %
Lymphs Abs: 1.1 10*3/uL (ref 0.7–4.0)
MCH: 27.9 pg (ref 26.0–34.0)
MCHC: 32.4 g/dL (ref 30.0–36.0)
MCV: 86 fL (ref 80.0–100.0)
Monocytes Absolute: 0.1 10*3/uL (ref 0.1–1.0)
Monocytes Relative: 3 %
Neutro Abs: 2.3 10*3/uL (ref 1.7–7.7)
Neutrophils Relative %: 65 %
Platelets: 98 10*3/uL — ABNORMAL LOW (ref 150–400)
RBC: 5.06 MIL/uL (ref 3.87–5.11)
RDW: 14.1 % (ref 11.5–15.5)
WBC: 3.5 10*3/uL — ABNORMAL LOW (ref 4.0–10.5)
nRBC: 0 % (ref 0.0–0.2)

## 2020-03-31 LAB — COMPREHENSIVE METABOLIC PANEL
ALT: 30 U/L (ref 0–44)
AST: 40 U/L (ref 15–41)
Albumin: 3.6 g/dL (ref 3.5–5.0)
Alkaline Phosphatase: 54 U/L (ref 38–126)
Anion gap: 12 (ref 5–15)
BUN: <5 mg/dL — ABNORMAL LOW (ref 6–20)
CO2: 28 mmol/L (ref 22–32)
Calcium: 8.3 mg/dL — ABNORMAL LOW (ref 8.9–10.3)
Chloride: 96 mmol/L — ABNORMAL LOW (ref 98–111)
Creatinine, Ser: 0.67 mg/dL (ref 0.44–1.00)
GFR calc Af Amer: >60 mL/min (ref >60)
GFR calc non Af Amer: >60 mL/min (ref >60)
Glucose, Bld: 257 mg/dL — ABNORMAL HIGH (ref 70–99)
Potassium: 3.3 mmol/L — ABNORMAL LOW (ref 3.5–5.1)
Sodium: 136 mmol/L (ref 135–145)
Total Bilirubin: 0.4 mg/dL (ref 0.3–1.2)
Total Protein: 7.4 g/dL (ref 6.5–8.1)

## 2020-03-31 LAB — LACTIC ACID, PLASMA: Lactic Acid, Venous: 1.2 mmol/L (ref 0.5–1.9)

## 2020-03-31 LAB — I-STAT BETA HCG BLOOD, ED (MC, WL, AP ONLY): I-stat hCG, quantitative: <5 m[IU]/mL (ref <5)

## 2020-03-31 LAB — TRIGLYCERIDES: Triglycerides: 103 mg/dL (ref <150)

## 2020-03-31 LAB — FIBRINOGEN: Fibrinogen: 482 mg/dL — ABNORMAL HIGH (ref 210–475)

## 2020-03-31 LAB — PROCALCITONIN: Procalcitonin: <0.1 ng/mL

## 2020-03-31 LAB — LACTATE DEHYDROGENASE: LDH: 263 U/L — ABNORMAL HIGH (ref 98–192)

## 2020-03-31 LAB — C-REACTIVE PROTEIN: CRP: 3.8 mg/dL — ABNORMAL HIGH (ref <1.0)

## 2020-03-31 LAB — D-DIMER, QUANTITATIVE: D-Dimer, Quant: 0.62 ug/mL-FEU — ABNORMAL HIGH (ref 0.00–0.50)

## 2020-03-31 LAB — FERRITIN: Ferritin: 191 ng/mL (ref 11–307)

## 2020-03-31 MED ORDER — DEXAMETHASONE SODIUM PHOSPHATE 10 MG/ML IJ SOLN
10.0000 mg | Freq: Once | INTRAMUSCULAR | Status: AC
  Administered 2020-03-31: 10 mg via INTRAVENOUS
  Filled 2020-03-31: qty 1

## 2020-03-31 MED ORDER — SODIUM CHLORIDE 0.9 % IV SOLN
100.0000 mg | Freq: Once | INTRAVENOUS | Status: AC
  Administered 2020-03-31: 100 mg via INTRAVENOUS
  Filled 2020-03-31: qty 20

## 2020-03-31 MED ORDER — ONDANSETRON HCL 4 MG/2ML IJ SOLN
4.0000 mg | Freq: Once | INTRAMUSCULAR | Status: AC
  Administered 2020-03-31: 4 mg via INTRAVENOUS
  Filled 2020-03-31: qty 2

## 2020-03-31 MED ORDER — ACETAMINOPHEN 325 MG PO TABS
650.0000 mg | ORAL_TABLET | Freq: Once | ORAL | Status: AC
  Administered 2020-03-31: 650 mg via ORAL
  Filled 2020-03-31: qty 2

## 2020-03-31 MED ORDER — SODIUM CHLORIDE 0.9 % IV SOLN
100.0000 mg | Freq: Every day | INTRAVENOUS | Status: AC
  Administered 2020-04-01 – 2020-04-04 (×4): 100 mg via INTRAVENOUS
  Filled 2020-03-31 (×3): qty 20
  Filled 2020-03-31: qty 100

## 2020-03-31 NOTE — ED Triage Notes (Signed)
Pt BIB EMS from home c/o [facility] that began today. Pt is COVID +. EMS reports 02 sat of 88% on RA. Pt was placed on NRB and now o2sat is 99%. Also c/o productive cough, no appetite, malaise, and fatigue.

## 2020-03-31 NOTE — ED Provider Notes (Signed)
[facility] DEPT Provider Note   CSN: [id number] Arrival date & time: 03/31/20  1710     History Chief Complaint  Patient presents with  . Shortness of Breath  . COVID+    Monique Schmidt is a 34 y.o. female.  The history is provided by the patient.  Shortness of Breath Severity:  Moderate Onset quality:  Gradual Timing:  Constant Progression:  Worsening Chronicity:  New Context: URI (COVID positive, symptoms for about 9 days. No vaccination.)   Associated symptoms: chest pain, cough and fever   Associated symptoms: no abdominal pain, no ear pain, no rash, no sore throat and no vomiting   Risk factors comment:  DM      Past Medical History:  Diagnosis Date  . Abnormal Pap smear   . Bacterial vaginosis   . Gestational diabetes    likely pre-exisiting; all pregnancies glyburide started 11/4  . Gestational thrombocytopenia ([place])   . Headache(784.0)   . Hernia, umbilical   . History of abnormal cervical Pap smear    normal 2014  . Mild preeclampsia delivered 01/22/2016  . Type 2 diabetes mellitus [facility])     Patient Active Problem List   Diagnosis Date Noted  . 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 History of gestational diabetes 06/26/2019  . 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 BMI 50.0-59.9, adult ([place]) 11/09/2013  . Moderate persistent asthma 08/07/2013  . Diabetes mellitus ([place]) 04/16/2011    Past Surgical History:  Procedure Laterality Date  . UMBILICAL HERNIA REPAIR N/A 11/22/2014   Procedure: LAPAROSCOPIC UMBILICAL HERNIA;  Surgeon: Ralene Ok, MD;  Location: [facility];  Service: General;  Laterality: N/A;  . WISDOM TOOTH EXTRACTION       OB History    Gravida  6   Para  5   Term  5   Preterm      AB  1   Living  5     SAB  1   TAB      Ectopic      Multiple  0   Live Births  5           Family History  Problem Relation Age of Onset  . Hypertension Father   .  Diabetes Father   . Diabetes Mother   . Seizures Brother     Social History   Tobacco Use  . Smoking status: Never Smoker  . Smokeless tobacco: Never Used  Vaping Use  . Vaping Use: Never used  Substance Use Topics  . Alcohol use: No  . Drug use: No    Home Medications Prior to Admission medications   Medication Sig Start Date End Date Taking? Authorizing Provider  acetaminophen (TYLENOL) 500 MG tablet Take 1,000 mg by mouth every 6 (six) hours as needed for mild pain or headache.   Yes [provider]  benzonatate (TESSALON) 100 MG capsule Take 1 capsule (100 mg total) by mouth every 8 (eight) hours. 03/28/20  Yes Venter, Margaux, PA-C  metFORMIN (GLUCOPHAGE-XR) 500 MG 24 hr tablet Take 1 tablet with breakfast for 1 week. Then take 2 tablets with breakfast daily. 03/20/20  Yes Darr, Marguerita Beards, PA-C  Pseudoeph-Doxylamine-DM-APAP (NYQUIL PO) Take 30 mLs by mouth at bedtime as needed (cough).    Yes [provider]  blood glucose meter kit and supplies KIT Dispense based on patient and insurance preference. Use up to four times daily as directed. (FOR ICD-9 250.00, 250.01). 03/20/20  Darr, Marguerita Beards, PA-C  metroNIDAZOLE (FLAGYL) 500 MG tablet Take 1 tablet (500 mg total) by mouth 2 (two) times daily. Patient not taking: Reported on 03/31/2020 03/20/20   Darr, Marguerita Beards, PA-C  naproxen (NAPROSYN) 500 MG tablet Take 1 tablet (500 mg total) by mouth 2 (two) times daily. Patient not taking: Reported on 03/28/2020 12/14/19   Raylene Everts, MD  nystatin (MYCOSTATIN/NYSTOP) powder Apply 1 application topically 3 (three) times daily. Patient not taking: Reported on 03/28/2020 11/23/19   Raylene Everts, MD  nystatin cream (MYCOSTATIN) Apply to affected area 2 times daily Patient not taking: Reported on 03/28/2020 11/23/19   Raylene Everts, MD  nystatin-triamcinolone ointment [facility]) Apply 1 application topically 2 (two) times daily. Patient not taking: Reported on 03/28/2020 03/20/20    Darr, Marguerita Beards, PA-C  tiZANidine (ZANAFLEX) 4 MG tablet Take 1-2 tablets (4-8 mg total) by mouth every 6 (six) hours as needed for muscle spasms. Patient not taking: Reported on 03/28/2020 12/14/19   Raylene Everts, MD  medroxyPROGESTERone (PROVERA) 10 MG tablet Take 1 tablet (10 mg total) by mouth daily. 06/24/19 08/18/19  Donnamae Jude, MD    Allergies    Latex and Penicillins  Review of Systems   Review of Systems  Constitutional: Positive for fever. Negative for chills.  HENT: Negative for ear pain and sore throat.   Eyes: Negative for pain and visual disturbance.  Respiratory: Positive for cough and shortness of breath.   Cardiovascular: Positive for chest pain. Negative for palpitations.  Gastrointestinal: Negative for abdominal pain and vomiting.  Genitourinary: Negative for dysuria and hematuria.  Musculoskeletal: Negative for arthralgias and back pain.  Skin: Negative for color change and rash.  Neurological: Negative for seizures and syncope.  All other systems reviewed and are negative.   Physical Exam Updated Vital Signs  ED Triage Vitals  Enc Vitals Group     BP 03/31/20 1730 113/71     Pulse Rate 03/31/20 1730 92     Resp 03/31/20 1730 (!) 26     Temp 03/31/20 1730 100 F (37.8 C)     Temp Source 03/31/20 1730 Oral     SpO2 03/31/20 1730 100 %     Weight 03/31/20 1731 (!) 304 lb 14.3 oz (138.3 kg)     Height 03/31/20 1731 '5\' 6"'  (1.676 m)     Head Circumference --      Peak Flow --      Pain Score 03/31/20 1730 10     Pain Loc --      Pain Edu? --      Excl. in [place]? --     Physical Exam Vitals and nursing note reviewed.  Constitutional:      General: She is not in acute distress.    Appearance: She is well-developed. She is obese. She is ill-appearing.  HENT:     Head: Normocephalic and atraumatic.     Mouth/Throat:     Mouth: Mucous membranes are moist.  Eyes:     Conjunctiva/sclera: Conjunctivae normal.     Pupils: Pupils are equal, round, and  reactive to light.  Cardiovascular:     Rate and Rhythm: Normal rate and regular rhythm.     Pulses: Normal pulses.     Heart sounds: Normal heart sounds. No murmur heard.   Pulmonary:     Effort: Tachypnea present.     Breath sounds: Decreased breath sounds present.     Comments: Coarse breath sounds throughout  Abdominal:     Palpations: Abdomen is soft.     Tenderness: There is no abdominal tenderness.  Musculoskeletal:     Cervical back: Normal range of motion and neck supple.  Skin:    General: Skin is warm and dry.  Neurological:     Mental Status: She is alert.     ED Results / Procedures / Treatments   Labs (all labs ordered are listed, but only abnormal results are displayed) Labs Reviewed  COMPREHENSIVE METABOLIC PANEL - Abnormal; Notable for the following components:      Result Value   Potassium 3.3 (*)    Chloride 96 (*)    Glucose, Bld 257 (*)    BUN <5 (*)    Calcium 8.3 (*)    All other components within normal limits  D-DIMER, QUANTITATIVE (NOT AT [facility]) - Abnormal; Notable for the following components:   D-Dimer, Quant 0.62 (*)    All other components within normal limits  LACTATE DEHYDROGENASE - Abnormal; Notable for the following components:   LDH 263 (*)    All other components within normal limits  FIBRINOGEN - Abnormal; Notable for the following components:   Fibrinogen 482 (*)    All other components within normal limits  C-REACTIVE PROTEIN - Abnormal; Notable for the following components:   CRP 3.8 (*)    All other components within normal limits  CULTURE, BLOOD (ROUTINE X 2)  CULTURE, BLOOD (ROUTINE X 2)  LACTIC ACID, PLASMA  FERRITIN  TRIGLYCERIDES  CBC WITH DIFFERENTIAL/PLATELET  PROCALCITONIN  I-STAT BETA HCG BLOOD, ED ([facility], [facility], [facility] ONLY)    EKG EKG Interpretation  Date/Time:  Thursday March 31 2020 17:33:54 EDT Ventricular Rate:  92 PR Interval:    QRS Duration: 90 QT Interval:  344 QTC Calculation: 426 R Axis:   58 Text  Interpretation: Sinus rhythm Low voltage, precordial leads Borderline T abnormalities, diffuse leads Confirmed by Lennice Sites [id number]) on 03/31/2020 5:52:28 PM   Radiology DG Chest Portable 1 View  Result Date: 03/31/2020 CLINICAL DATA:  Shortness of breath, COVID EXAM: PORTABLE CHEST 1 VIEW COMPARISON:  03/28/2020 FINDINGS: Low lung volumes. Cardiomegaly. Perihilar airspace opacities and bibasilar opacities. No effusions. No acute bony abnormality. IMPRESSION: Cardiomegaly. Low lung volumes with perihilar and lower lobe opacities which could reflect edema or infection. Electronically Signed   By: Rolm Baptise M.D.   On: 03/31/2020 18:44    Procedures .Critical Care Performed by: Lennice Sites, DO Authorized by: Lennice Sites, DO   Critical care provider statement:    Critical care time (minutes):  35   Critical care was necessary to treat or prevent imminent or life-threatening deterioration of the following conditions:  Respiratory failure   Critical care was time spent personally by me on the following activities:  Blood draw for specimens, development of treatment plan with patient or surrogate, discussions with primary provider, evaluation of patient's response to treatment, examination of patient, obtaining history from patient or surrogate, ordering and performing treatments and interventions, ordering and review of laboratory studies, ordering and review of radiographic studies, pulse oximetry, re-evaluation of patient's condition and review of old charts   I assumed direction of critical care for this patient from another provider in my specialty: no     (including critical care time)  Medications Ordered in ED Medications  remdesivir 100 mg in sodium chloride 0.9 % 100 mL IVPB (has no administration in time range)  remdesivir 100 mg in sodium chloride 0.9 % 100 mL IVPB (  has no administration in time range)    And  remdesivir 100 mg in sodium chloride 0.9 % 100 mL IVPB (has no  administration in time range)  dexamethasone (DECADRON) injection 10 mg (10 mg Intravenous Given 03/31/20 1821)  acetaminophen (TYLENOL) tablet 650 mg (650 mg Oral Given 03/31/20 1824)  ondansetron (ZOFRAN) injection 4 mg (4 mg Intravenous Given 03/31/20 1820)    ED Course  I have reviewed the triage vital signs and the nursing notes.  Pertinent labs & imaging results that were available during my care of the patient were reviewed by me and considered in my medical decision making (see chart for details).    MDM Rules/Calculators/A&P                          Monique Schmidt is a 34 year old with history of diabetes, obesity presents to the ED with shortness of breath.  Diagnosed with Covid several days ago.  Patient hypoxic upon EMS arrival to the mid 80s.  Hypoxic on room air as well here but only with ambulation which she can only do while standing in place.  Placed on 4 L of oxygen.  Low-grade fever.  Suspect Covid pneumonia.  Chest x-ray shows changes likely consistent with Covid pneumonia.  Inflammatory markers elevated including fibrinogen, D-dimer, LDH.  Blood sugars 257.  Otherwise lab work is unremarkable.  Will admit given new hypoxic respiratory failure.  Has been given Decadron and remdesivir.  This chart was dictated using voice recognition software.  Despite best efforts to proofread,  errors can occur which can change the documentation meaning.   Monique Schmidt was evaluated in Emergency Department on 03/31/2020 for the symptoms described in the history of present illness. She was evaluated in the context of the global COVID-19 pandemic, which necessitated consideration that the patient might be at risk for infection with the SARS-CoV-2 virus that causes COVID-19. Institutional protocols and algorithms that pertain to the evaluation of patients at risk for COVID-19 are in a state of rapid change based on information released by regulatory bodies including the CDC and federal and  state organizations. These policies and algorithms were followed during the patient's care in the ED.   Final Clinical Impression(s) / ED Diagnoses Final diagnoses:  COVID-19  Acute respiratory failure with hypoxia [facility])    Rx / DC Orders ED Discharge Orders    None       Lennice Sites, DO 03/31/20 1949

## 2020-03-31 NOTE — Telephone Encounter (Signed)
Patient called and she says her blood sugar is high. This morning it was 288 and at 1525 it was 289. She says she's doesn't have an appetite. She says she takes Metformin as prescribed and doesn't feel like it's working because he blood sugar is always high in 300's. She says she has abdominal pain and is COVID positive. I advised to go to the ED and if she decides to call 911 to let them know she's COVID positive. She verbalized understanding.  Reason for Disposition . Patient sounds very sick or weak to the triager  Answer Assessment - Initial Assessment Questions 1. BLOOD GLUCOSE: "What is your blood glucose level?"      289 2. ONSET: "When did you check the blood glucose?"     1525 today 3. USUAL RANGE: "What is your glucose level usually?" (e.g., usual fasting morning value, usual evening value)     It's high all the time 4. KETONES: "Do you check for ketones (urine or blood test strips)?" If yes, ask: "What does the test show now?"      N/A 5. TYPE 1 or 2:  "Do you know what type of diabetes you have?"  (e.g., Type 1, Type 2, Gestational; doesn't know)      Type 2 6. INSULIN: "Do you take insulin?" "What type of insulin(s) do you use? What is the mode of delivery? (syringe, pen; injection or pump)?"      No 7. DIABETES PILLS: "Do you take any pills for your diabetes?" If yes, ask: "Have you missed taking any pills recently?"     Metformin, no missed doses 8. OTHER SYMPTOMS: "Do you have any symptoms?" (e.g., fever, frequent urination, difficulty breathing, dizziness, weakness, vomiting)      COVID positive, abdominal pain, no appetite 9. PREGNANCY: "Is there any chance you are pregnant?" "When was your last menstrual period?"     I don't know; LMP in April  Protocols used: DIABETES - HIGH BLOOD SUGAR-A-AH

## 2020-03-31 NOTE — H&P (Addendum)
TRH H&P    Patient Demographics:    Monique Schmidt, is a 34 y.o. female  MRN: [id number]  DOB - 1986/03/09  Admit Date - 03/31/2020  Referring MD/NP/PA: Vernell Barrier  Outpatient Primary MD for the patient is Patient, No Pcp Per  Patient coming from: Home  Chief complaint- High sugars   HPI:    Monique Schmidt  is a 34 y.o. female, with history of morbid obesity, diabetes mellitus type 2, presents to the ED with a chief complaint of "I was diagnosed with Covid on Tuesday."  Patient reports that since her diagnosis she has not had any appetite, and her glucose has been higher than what she is used to in the 200s to 300s.  These are her primary reasons for coming in today.  She reports that she is also had some shortness of breath that is worse with exertion and better with rest.  She has had a cough that is productive of green sputum and is constant throughout the day.  She reports that the cough has been getting worse over the past few days.  Patient reports chest pain associated with a cough in the center of her chest.  She admits to palpitations, lightheadedness, body aches, abdominal pain, nausea-but no vomiting, loose stools 4-5 times a day, and a fever with a T-max of 101.  Patient has taken Motrin at home with minimal relief of her fever.  Patient reports that her diarrhea is nonbloody.  At the time of my exam she has no abdominal pain, and is eating a pastry.  Patient reports that her sugars have been in the 2-3 100s without eating.  She reports that this is abnormal for her and she is usually well controlled on her Metformin.  Patient takes no insulin at home.  We discussed the patient's most significant risk factor is her obesity.  We discussed healthy food choices.  Patient does not smoke cigarettes, drink alcohol, or use illicit drugs.  In the ED Temperature 100, blood pressure 113/71, heart rate 92,  respiratory rate 26, satting at 96% on 2 L nasal cannula Leukopenia with a white blood cell count of 3.5 CHEM panel reveals hypokalemia at 3.3 Hyperglycemia at 257 Covid markers: LDH 263, triglycerides 103, ferritin 191, CRP 3.8, lactic acid 1.2, pro-Cal less than 0.10 Chest x-ray shows perihilar and lower lobe opacities consistent with infection Patient was started on remdesivir and Decadron    Review of systems:    Review of Systems  Constitutional: Positive for chills, fever and malaise/fatigue. Negative for weight loss.  HENT: Positive for congestion. Negative for sinus pain and sore throat.   Eyes: Negative for blurred vision and double vision.  Respiratory: Positive for cough, sputum production and shortness of breath. Negative for hemoptysis and wheezing.   Cardiovascular: Positive for chest pain and palpitations. Negative for leg swelling.  Gastrointestinal: Positive for abdominal pain, diarrhea, heartburn and nausea. Negative for blood in stool, constipation, melena and vomiting.  Genitourinary: Negative for dysuria, frequency and urgency.  Musculoskeletal: Positive for myalgias. Negative for falls.  Skin: Negative for itching and rash.  Neurological: Positive for dizziness and weakness. Negative for tingling and focal weakness.  Psychiatric/Behavioral: Negative for substance abuse.    All other systems reviewed and are negative.    Past History of the following :    Past Medical History:  Diagnosis Date  . Abnormal Pap smear   . Bacterial vaginosis   . Gestational diabetes    likely pre-exisiting; all pregnancies glyburide started 11/4  . Gestational thrombocytopenia ([place])   . Headache(784.0)   . Hernia, umbilical   . History of abnormal cervical Pap smear    normal 2014  . Mild preeclampsia delivered 01/22/2016  . Type 2 diabetes mellitus ([place])       Past Surgical History:  Procedure Laterality Date  . UMBILICAL HERNIA REPAIR N/A 11/22/2014   Procedure:  LAPAROSCOPIC UMBILICAL HERNIA;  Surgeon: Ralene Ok, MD;  Location: [facility];  Service: General;  Laterality: N/A;  . WISDOM TOOTH EXTRACTION        Social History:      Social History   Tobacco Use  . Smoking status: Never Smoker  . Smokeless tobacco: Never Used  Substance Use Topics  . Alcohol use: No       Family History :     Family History  Problem Relation Age of Onset  . Hypertension Father   . Diabetes Father   . Diabetes Mother   . Seizures Brother    Reviewed   Home Medications:   Prior to Admission medications   Medication Sig Start Date End Date Taking? Authorizing Provider  acetaminophen (TYLENOL) 500 MG tablet Take 1,000 mg by mouth every 6 (six) hours as needed for mild pain or headache.   Yes [provider]  benzonatate (TESSALON) 100 MG capsule Take 1 capsule (100 mg total) by mouth every 8 (eight) hours. 03/28/20  Yes Venter, Margaux, PA-C  metFORMIN (GLUCOPHAGE-XR) 500 MG 24 hr tablet Take 1 tablet with breakfast for 1 week. Then take 2 tablets with breakfast daily. 03/20/20  Yes Darr, Marguerita Beards, PA-C  Pseudoeph-Doxylamine-DM-APAP (NYQUIL PO) Take 30 mLs by mouth at bedtime as needed (cough).    Yes [provider]  blood glucose meter kit and supplies KIT Dispense based on patient and insurance preference. Use up to four times daily as directed. (FOR ICD-9 250.00, 250.01). 03/20/20   Darr, Marguerita Beards, PA-C  metroNIDAZOLE (FLAGYL) 500 MG tablet Take 1 tablet (500 mg total) by mouth 2 (two) times daily. Patient not taking: Reported on 03/31/2020 03/20/20   Darr, Marguerita Beards, PA-C  naproxen (NAPROSYN) 500 MG tablet Take 1 tablet (500 mg total) by mouth 2 (two) times daily. Patient not taking: Reported on 03/28/2020 12/14/19   Raylene Everts, MD  nystatin (MYCOSTATIN/NYSTOP) powder Apply 1 application topically 3 (three) times daily. Patient not taking: Reported on 03/28/2020 11/23/19   Raylene Everts, MD  nystatin cream (MYCOSTATIN) Apply to affected  area 2 times daily Patient not taking: Reported on 03/28/2020 11/23/19   Raylene Everts, MD  nystatin-triamcinolone ointment [facility]) Apply 1 application topically 2 (two) times daily. Patient not taking: Reported on 03/28/2020 03/20/20   Darr, Marguerita Beards, PA-C  tiZANidine (ZANAFLEX) 4 MG tablet Take 1-2 tablets (4-8 mg total) by mouth every 6 (six) hours as needed for muscle spasms. Patient not taking: Reported on 03/28/2020 12/14/19   Raylene Everts, MD  medroxyPROGESTERone (PROVERA) 10 MG tablet Take 1 tablet (10 mg total) by mouth daily. 06/24/19 08/18/19  Donnamae Jude, MD     Allergies:     Allergies  Allergen Reactions  . Latex Itching and Rash  . Penicillins Itching and Rash    Did it involve swelling of the face/tongue/throat, SOB, or low BP? N Did it involve sudden or severe rash/hives, skin peeling, or any reaction on the inside of your mouth or nose? Y Did you need to seek medical attention at a hospital or doctor's office? No When did it last happen?Several Years Ago If all above answers are "NO", may proceed with cephalosporin use.         Physical Exam:   Vitals  Blood pressure (!) 146/95, pulse 83, temperature 100 F (37.8 C), temperature source Oral, resp. rate 20, height '5\' 6"'$  (1.676 m), weight (!) 138.3 kg, last menstrual period 03/21/2020, SpO2 95 %.  1.  General: Lying supine in bed in no acute distress  2. Psychiatric: Mood and behavior normal for situation  3. Neurologic: Cranial nerves II through XII are grossly intact, no focal deficits on limited exam, alert and oriented x3  4. HEENMT:  Head is atraumatic, normocephalic, pupils are reactive to light, mucous membranes are moist, trachea is midline, neck is supple  5. Respiratory : Difficult to auscultate given body habitus - diminished breath sounds in the lower lung fields without wheeze, rhonchi, or crackles  6. Cardiovascular : Heart rate is normal, rhythm is regular, no murmurs rubs or  gallops  7. Gastrointestinal:  Abdomen is soft, nondistended, nontender to palpation  8. Skin:  No acute lesions on limited skin exam  9.Musculoskeletal:  No acute deformities    Data Review:    CBC Recent Labs  Lab 03/28/20 1048 03/31/20 1820  WBC 3.2* 3.5*  HGB 14.2 14.1  HCT 42.5 43.5  PLT 123* 98*  MCV 85.3 86.0  MCH 28.5 27.9  MCHC 33.4 32.4  RDW 14.1 14.1  LYMPHSABS 1.1 1.1  MONOABS 0.2 0.1  EOSABS 0.0 0.0  BASOSABS 0.0 0.0   ------------------------------------------------------------------------------------------------------------------  Results for orders placed or performed during the hospital encounter of 03/31/20 (from the past 48 hour(s))  Lactic acid, plasma     Status: None   Collection Time: 03/31/20  6:20 PM  Result Value Ref Range   Lactic Acid, Venous 1.2 0.5 - 1.9 mmol/L    Comment: Performed at [facility], [place] [address] [place], [place] [address]  CBC WITH DIFFERENTIAL     Status: Abnormal   Collection Time: 03/31/20  6:20 PM  Result Value Ref Range   WBC 3.5 (L) 4.0 - 10.5 K/uL   RBC 5.06 3.87 - 5.11 MIL/uL   Hemoglobin 14.1 12.0 - 15.0 g/dL   HCT 43.5 36 - 46 %   MCV 86.0 80.0 - 100.0 fL   MCH 27.9 26.0 - 34.0 pg   MCHC 32.4 30.0 - 36.0 g/dL   RDW 14.1 11.5 - 15.5 %   Platelets 98 (L) 150 - 400 K/uL    Comment: REPEATED TO VERIFY PLATELET COUNT CONFIRMED BY SMEAR SPECIMEN CHECKED FOR CLOTS Immature Platelet Fraction may be clinically indicated, consider ordering this additional test [id number]    nRBC 0.0 0.0 - 0.2 %   Neutrophils Relative % 65 %   Neutro Abs 2.3 1.7 - 7.7 K/uL   Lymphocytes Relative 31 %   Lymphs Abs 1.1 0.7 - 4.0 K/uL   Monocytes Relative 3 %   Monocytes Absolute 0.1 0 - 1 K/uL   Eosinophils Relative 0 %  Eosinophils Absolute 0.0 0 - 0 K/uL   Basophils Relative 0 %   Basophils Absolute 0.0 0 - 0 K/uL   Immature Granulocytes 1 %   Abs Immature Granulocytes 0.02 0.00 - 0.07 K/uL     Comment: Performed at [facility], [place] [address], [place], [place] [address]  Comprehensive metabolic panel     Status: Abnormal   Collection Time: 03/31/20  6:20 PM  Result Value Ref Range   Sodium 136 135 - 145 mmol/L   Potassium 3.3 (L) 3.5 - 5.1 mmol/L   Chloride 96 (L) 98 - 111 mmol/L   CO2 28 22 - 32 mmol/L   Glucose, Bld 257 (H) 70 - 99 mg/dL    Comment: Glucose reference range applies only to samples taken after fasting for at least 8 hours.   BUN <5 (L) 6 - 20 mg/dL   Creatinine, Ser 0.67 0.44 - 1.00 mg/dL   Calcium 8.3 (L) 8.9 - 10.3 mg/dL   Total Protein 7.4 6.5 - 8.1 g/dL   Albumin 3.6 3.5 - 5.0 g/dL   AST 40 15 - 41 U/L   ALT 30 0 - 44 U/L   Alkaline Phosphatase 54 38 - 126 U/L   Total Bilirubin 0.4 0.3 - 1.2 mg/dL   GFR calc non Af Amer >60 >60 mL/min   GFR calc Af Amer >60 >60 mL/min   Anion gap 12 5 - 15    Comment: Performed at [facility], [place] [address], [place], [place] [address]  D-dimer, quantitative     Status: Abnormal   Collection Time: 03/31/20  6:20 PM  Result Value Ref Range   D-Dimer, Quant 0.62 (H) 0.00 - 0.50 ug/mL-FEU    Comment: (NOTE) At the manufacturer cut-off of 0.50 ug/mL FEU, this assay has been documented to exclude PE with a sensitivity and negative predictive value of 97 to 99%.  At this time, this assay has not been approved by the FDA to exclude DVT/VTE. Results should be correlated with clinical presentation. Performed at [facility], [place] [address], [place], [place] [address]   Procalcitonin     Status: None   Collection Time: 03/31/20  6:20 PM  Result Value Ref Range   Procalcitonin <0.10 ng/mL    Comment:        Interpretation: PCT (Procalcitonin) <= 0.5 ng/mL: Systemic infection (sepsis) is not likely. Local bacterial infection is possible. (NOTE)       Sepsis PCT Algorithm           Lower Respiratory Tract                                      Infection PCT  Algorithm    ----------------------------     ----------------------------         PCT < 0.25 ng/mL                PCT < 0.10 ng/mL          Strongly encourage             Strongly discourage   discontinuation of antibiotics    initiation of antibiotics    ----------------------------     -----------------------------       PCT 0.25 - 0.50 ng/mL            PCT 0.10 - 0.25 ng/mL  OR       >80% decrease in PCT            Discourage initiation of                                            antibiotics      Encourage discontinuation           of antibiotics    ----------------------------     -----------------------------         PCT >= 0.50 ng/mL              PCT 0.26 - 0.50 ng/mL               AND        <80% decrease in PCT             Encourage initiation of                                             antibiotics       Encourage continuation           of antibiotics    ----------------------------     -----------------------------        PCT >= 0.50 ng/mL                  PCT > 0.50 ng/mL               AND         increase in PCT                  Strongly encourage                                      initiation of antibiotics    Strongly encourage escalation           of antibiotics                                     -----------------------------                                           PCT <= 0.25 ng/mL                                                 OR                                        > 80% decrease in PCT                                      Discontinue / Do not initiate  antibiotics  Performed at [facility], [place] [address] [place], [place] [address]   Lactate dehydrogenase     Status: Abnormal   Collection Time: 03/31/20  6:20 PM  Result Value Ref Range   LDH 263 (H) 98 - 192 U/L    Comment: Performed at [facility], [place] [address], [place], [place] [address]    Ferritin     Status: None   Collection Time: 03/31/20  6:20 PM  Result Value Ref Range   Ferritin 191 11 - 307 ng/mL    Comment: Performed at [facility], [place] [address], [place], [place] [address]  Triglycerides     Status: None   Collection Time: 03/31/20  6:20 PM  Result Value Ref Range   Triglycerides 103 <150 mg/dL    Comment: Performed at [facility], [place] [address], [place], [place] [address]  Fibrinogen     Status: Abnormal   Collection Time: 03/31/20  6:20 PM  Result Value Ref Range   Fibrinogen 482 (H) 210 - 475 mg/dL    Comment: Performed at [facility], [place] [address] [place], [place] [address]  C-reactive protein     Status: Abnormal   Collection Time: 03/31/20  6:20 PM  Result Value Ref Range   CRP 3.8 (H) <1.0 mg/dL    Comment: Performed at [facility], [place] [address] [place], [place] [address]  I-Stat beta hCG blood, ED     Status: None   Collection Time: 03/31/20  6:35 PM  Result Value Ref Range   I-stat hCG, quantitative <5.0 <5 mIU/mL   Comment 3            Comment:   GEST. AGE      CONC.  (mIU/mL)   <=1 WEEK        5 - 50     2 WEEKS       50 - 500     3 WEEKS       100 - 10,000     4 WEEKS     1,000 - 30,000        FEMALE AND NON-PREGNANT FEMALE:     LESS THAN 5 mIU/mL     Chemistries  Recent Labs  Lab 03/28/20 1048 03/31/20 1820  NA 134* 136  K 3.4* 3.3*  CL 99 96*  CO2 25 28  GLUCOSE 340* 257*  BUN <5* <5*  CREATININE 0.64 0.67  CALCIUM 8.3* 8.3*  AST  --  40  ALT  --  30  ALKPHOS  --  54  BILITOT  --  0.4   ------------------------------------------------------------------------------------------------------------------  ------------------------------------------------------------------------------------------------------------------ GFR: Estimated Creatinine Clearance: 139.5 mL/min (by C-G formula based on SCr of 0.67 mg/dL). Liver Function  Tests: Recent Labs  Lab 03/31/20 1820  AST 40  ALT 30  ALKPHOS 54  BILITOT 0.4  PROT 7.4  ALBUMIN 3.6   No results for input(s): LIPASE, AMYLASE in the last 168 hours. No results for input(s): AMMONIA in the last 168 hours. Coagulation Profile: No results for input(s): INR, PROTIME in the last 168 hours. Cardiac Enzymes: No results for input(s): CKTOTAL, CKMB, CKMBINDEX, TROPONINI in the last 168 hours. BNP (last 3 results) No results for input(s): PROBNP in the last 8760 hours. HbA1C: No results for input(s): HGBA1C in the last 72 hours. CBG: Recent Labs  Lab 03/28/20 1041  GLUCAP 329*   Lipid Profile: Recent Labs    03/31/20  1820  TRIG 103   Thyroid Function Tests: No results for input(s): TSH, T4TOTAL, FREET4, T3FREE, THYROIDAB in the last 72 hours. Anemia Panel: Recent Labs    03/31/20 1820  FERRITIN 191    --------------------------------------------------------------------------------------------------------------- Urine analysis:    Component Value Date/Time   COLORURINE YELLOW 01/01/2020 2019   APPEARANCEUR HAZY (A) 01/01/2020 2019   LABSPEC 1.025 03/20/2020 1826   PHURINE 5.5 03/20/2020 1826   GLUCOSEU >=1000 (A) 03/20/2020 1826   HGBUR MODERATE (A) 03/20/2020 1826   BILIRUBINUR NEGATIVE 03/20/2020 1826   KETONESUR TRACE (A) 03/20/2020 1826   PROTEINUR NEGATIVE 03/20/2020 1826   UROBILINOGEN 0.2 03/20/2020 1826   NITRITE NEGATIVE 03/20/2020 1826   LEUKOCYTESUR NEGATIVE 03/20/2020 1826      Imaging Results:    DG Chest Portable 1 View  Result Date: 03/31/2020 CLINICAL DATA:  Shortness of breath, COVID EXAM: PORTABLE CHEST 1 VIEW COMPARISON:  03/28/2020 FINDINGS: Low lung volumes. Cardiomegaly. Perihilar airspace opacities and bibasilar opacities. No effusions. No acute bony abnormality. IMPRESSION: Cardiomegaly. Low lung volumes with perihilar and lower lobe opacities which could reflect edema or infection. Electronically Signed   By: Rolm Baptise M.D.   On: 03/31/2020 18:44    My personal review of EKG: Rhythm NSR, Rate 92 /min, QTc 426, nonspecific T wave changes   Assessment & Plan:    Active Problems:   * No active hospital problems. *   1. Acute respiratory failure with hypoxia 1. O2 sats dropping to mid 80s 2. Improved on 2 L nasal cannula 3. Secondary to Covid infection 4. Wean off O2 as tolerated 5. Continue inhalers 6. Continue treatment as below 2. Covid Pneumonia 1. With cxr, leukopenia, and inflammatory markers listed above 2. Continue remdesivir and Decadron 3. Continue inhalers 4. Continue Covid precautions 5. Continue to monitor 3. DMII 1. On Metformin at home 2. Hold oral hypoglycemics 3. Sliding scale coverage 4. Carb controlled diet 5. Continue to monitor 4. Morbid obesity 1. Extensive discussion regarding weight loss as an important part of her health care plan 5. Hypokalemia 1. Replace and recheck   DVT Prophylaxis-   Heparin - SCDs   AM Labs Ordered, also please review Full Orders  Family Communication: No family at bedside Code Status:  Full  Admission status: Inpatient :The appropriate admission status for this patient is INPATIENT. Inpatient status is judged to be reasonable and necessary in order to provide the required intensity of service to ensure the patient's safety. The patient's presenting symptoms, physical exam findings, and initial radiographic and laboratory data in the context of their chronic comorbidities is felt to place them at high risk for further clinical deterioration. Furthermore, it is not anticipated that the patient will be medically stable for discharge from the hospital within 2 midnights of admission. The following factors support the admission status of inpatient.     The patient's presenting symptoms include dyspnea The worrisome physical exam findings include hypoxia to mid 80s The initial radiographic and laboratory data are worrisome because of  opacities on CXR The chronic co-morbidities include morbid obesity, DMII       * I certify that at the point of admission it is my clinical judgment that the patient will require inpatient hospital care spanning beyond 2 midnights from the point of admission due to high intensity of service, high risk for further deterioration and high frequency of surveillance required.*  Time spent in minutes : Leon

## 2020-04-01 ENCOUNTER — Encounter (HOSPITAL_COMMUNITY): Payer: Self-pay | Admitting: Family Medicine

## 2020-04-01 DIAGNOSIS — J9601 Acute respiratory failure with hypoxia: Secondary | ICD-10-CM | POA: Insufficient documentation

## 2020-04-01 LAB — COMPREHENSIVE METABOLIC PANEL
ALT: 35 U/L (ref 0–44)
AST: 48 U/L — ABNORMAL HIGH (ref 15–41)
Albumin: 3.5 g/dL (ref 3.5–5.0)
Alkaline Phosphatase: 57 U/L (ref 38–126)
Anion gap: 13 (ref 5–15)
BUN: 8 mg/dL (ref 6–20)
CO2: 26 mmol/L (ref 22–32)
Calcium: 8.8 mg/dL — ABNORMAL LOW (ref 8.9–10.3)
Chloride: 97 mmol/L — ABNORMAL LOW (ref 98–111)
Creatinine, Ser: 0.57 mg/dL (ref 0.44–1.00)
GFR calc Af Amer: >60 mL/min (ref >60)
GFR calc non Af Amer: >60 mL/min (ref >60)
Glucose, Bld: 369 mg/dL — ABNORMAL HIGH (ref 70–99)
Potassium: 4 mmol/L (ref 3.5–5.1)
Sodium: 136 mmol/L (ref 135–145)
Total Bilirubin: 0.4 mg/dL (ref 0.3–1.2)
Total Protein: 7.3 g/dL (ref 6.5–8.1)

## 2020-04-01 LAB — CBC
HCT: 41.9 % (ref 36.0–46.0)
Hemoglobin: 13.6 g/dL (ref 12.0–15.0)
MCH: 27.9 pg (ref 26.0–34.0)
MCHC: 32.5 g/dL (ref 30.0–36.0)
MCV: 86 fL (ref 80.0–100.0)
Platelets: 106 10*3/uL — ABNORMAL LOW (ref 150–400)
RBC: 4.87 MIL/uL (ref 3.87–5.11)
RDW: 14.1 % (ref 11.5–15.5)
WBC: 2.9 10*3/uL — ABNORMAL LOW (ref 4.0–10.5)
nRBC: 0 % (ref 0.0–0.2)

## 2020-04-01 LAB — FERRITIN: Ferritin: 198 ng/mL (ref 11–307)

## 2020-04-01 LAB — D-DIMER, QUANTITATIVE: D-Dimer, Quant: 0.94 ug/mL-FEU — ABNORMAL HIGH (ref 0.00–0.50)

## 2020-04-01 LAB — CBG MONITORING, ED
Glucose-Capillary: 284 mg/dL — ABNORMAL HIGH (ref 70–99)
Glucose-Capillary: 309 mg/dL — ABNORMAL HIGH (ref 70–99)
Glucose-Capillary: 320 mg/dL — ABNORMAL HIGH (ref 70–99)
Glucose-Capillary: 364 mg/dL — ABNORMAL HIGH (ref 70–99)
Glucose-Capillary: 398 mg/dL — ABNORMAL HIGH (ref 70–99)

## 2020-04-01 LAB — MAGNESIUM: Magnesium: 2.2 mg/dL (ref 1.7–2.4)

## 2020-04-01 LAB — HIV ANTIBODY (ROUTINE TESTING W REFLEX): HIV Screen 4th Generation wRfx: NONREACTIVE

## 2020-04-01 LAB — C-REACTIVE PROTEIN: CRP: 4.4 mg/dL — ABNORMAL HIGH (ref <1.0)

## 2020-04-01 MED ORDER — METOPROLOL TARTRATE 5 MG/5ML IV SOLN
5.0000 mg | Freq: Four times a day (QID) | INTRAVENOUS | Status: DC | PRN
  Filled 2020-04-01: qty 5

## 2020-04-01 MED ORDER — INSULIN ASPART 100 UNIT/ML ~~LOC~~ SOLN
0.0000 [IU] | Freq: Three times a day (TID) | SUBCUTANEOUS | Status: DC
  Administered 2020-04-01 (×2): 15 [IU] via SUBCUTANEOUS
  Administered 2020-04-01: 11 [IU] via SUBCUTANEOUS
  Administered 2020-04-02 (×2): 20 [IU] via SUBCUTANEOUS
  Administered 2020-04-02: 15 [IU] via SUBCUTANEOUS
  Administered 2020-04-03 (×3): 20 [IU] via SUBCUTANEOUS
  Administered 2020-04-04: 15 [IU] via SUBCUTANEOUS
  Administered 2020-04-04: 20 [IU] via SUBCUTANEOUS
  Filled 2020-04-01: qty 0.2

## 2020-04-01 MED ORDER — METHYLPREDNISOLONE SODIUM SUCC 125 MG IJ SOLR
45.0000 mg | Freq: Three times a day (TID) | INTRAMUSCULAR | Status: DC
  Administered 2020-04-01 – 2020-04-04 (×11): 45 mg via INTRAVENOUS
  Filled 2020-04-01 (×10): qty 2

## 2020-04-01 MED ORDER — DIPHENHYDRAMINE HCL 25 MG PO CAPS
25.0000 mg | ORAL_CAPSULE | Freq: Four times a day (QID) | ORAL | Status: DC | PRN
  Administered 2020-04-01 – 2020-04-04 (×4): 25 mg via ORAL
  Filled 2020-04-01 (×4): qty 1

## 2020-04-01 MED ORDER — ONDANSETRON HCL 4 MG PO TABS: 4.0000 mg | ORAL_TABLET | Freq: Four times a day (QID) | ORAL | Status: DC | PRN

## 2020-04-01 MED ORDER — ONDANSETRON HCL 4 MG/2ML IJ SOLN
4.0000 mg | Freq: Four times a day (QID) | INTRAMUSCULAR | Status: DC | PRN
  Administered 2020-04-02 – 2020-04-04 (×2): 4 mg via INTRAVENOUS
  Filled 2020-04-01 (×2): qty 2

## 2020-04-01 MED ORDER — ENOXAPARIN SODIUM 40 MG/0.4ML ~~LOC~~ SOLN
40.0000 mg | Freq: Two times a day (BID) | SUBCUTANEOUS | Status: DC
  Administered 2020-04-01 – 2020-04-03 (×4): 40 mg via SUBCUTANEOUS
  Filled 2020-04-01 (×4): qty 0.4

## 2020-04-01 MED ORDER — INSULIN ASPART 100 UNIT/ML ~~LOC~~ SOLN
0.0000 [IU] | Freq: Every day | SUBCUTANEOUS | Status: DC
  Administered 2020-04-01 – 2020-04-02 (×3): 5 [IU] via SUBCUTANEOUS
  Administered 2020-04-03: 4 [IU] via SUBCUTANEOUS
  Filled 2020-04-01: qty 0.05

## 2020-04-01 MED ORDER — OXYCODONE HCL 5 MG PO TABS
5.0000 mg | ORAL_TABLET | ORAL | Status: DC | PRN
  Administered 2020-04-01 – 2020-04-04 (×7): 5 mg via ORAL
  Filled 2020-04-01 (×7): qty 1

## 2020-04-01 MED ORDER — ACETAMINOPHEN 325 MG PO TABS
650.0000 mg | ORAL_TABLET | Freq: Four times a day (QID) | ORAL | Status: DC | PRN
  Administered 2020-04-01 – 2020-04-03 (×2): 650 mg via ORAL
  Filled 2020-04-01 (×3): qty 2

## 2020-04-01 MED ORDER — DEXAMETHASONE SODIUM PHOSPHATE 10 MG/ML IJ SOLN: 6.0000 mg | INTRAMUSCULAR | Status: DC

## 2020-04-01 MED ORDER — HEPARIN SODIUM (PORCINE) 5000 UNIT/ML IJ SOLN
5000.0000 [IU] | Freq: Three times a day (TID) | INTRAMUSCULAR | Status: DC
  Administered 2020-04-01 (×3): 5000 [IU] via SUBCUTANEOUS
  Filled 2020-04-01 (×3): qty 1

## 2020-04-01 MED ORDER — ACETAMINOPHEN 650 MG RE SUPP: 650.0000 mg | Freq: Four times a day (QID) | RECTAL | Status: DC | PRN

## 2020-04-01 MED ORDER — LINAGLIPTIN 5 MG PO TABS
5.0000 mg | ORAL_TABLET | Freq: Every day | ORAL | Status: DC
  Administered 2020-04-01 – 2020-04-04 (×4): 5 mg via ORAL
  Filled 2020-04-01 (×4): qty 1

## 2020-04-01 MED ORDER — POTASSIUM CHLORIDE 20 MEQ PO PACK
40.0000 meq | PACK | Freq: Once | ORAL | Status: AC
  Administered 2020-04-01: 40 meq via ORAL
  Filled 2020-04-01: qty 2

## 2020-04-01 MED ORDER — ASCORBIC ACID 500 MG PO TABS
1000.0000 mg | ORAL_TABLET | Freq: Every day | ORAL | Status: DC
  Administered 2020-04-01 – 2020-04-04 (×4): 1000 mg via ORAL
  Filled 2020-04-01 (×4): qty 2

## 2020-04-01 MED ORDER — INSULIN ASPART 100 UNIT/ML ~~LOC~~ SOLN
8.0000 [IU] | Freq: Three times a day (TID) | SUBCUTANEOUS | Status: DC
  Administered 2020-04-02 (×2): 8 [IU] via SUBCUTANEOUS
  Filled 2020-04-01: qty 0.08

## 2020-04-01 MED ORDER — INSULIN DETEMIR 100 UNIT/ML ~~LOC~~ SOLN
20.0000 [IU] | Freq: Two times a day (BID) | SUBCUTANEOUS | Status: DC
  Administered 2020-04-02 (×2): 20 [IU] via SUBCUTANEOUS
  Filled 2020-04-01 (×4): qty 0.2

## 2020-04-01 MED ORDER — HYDROCODONE-HOMATROPINE 5-1.5 MG/5ML PO SYRP
5.0000 mL | ORAL_SOLUTION | Freq: Three times a day (TID) | ORAL | Status: DC | PRN
  Administered 2020-04-02 – 2020-04-04 (×4): 5 mL via ORAL
  Filled 2020-04-01 (×3): qty 5

## 2020-04-01 MED ORDER — INSULIN DETEMIR 100 UNIT/ML ~~LOC~~ SOLN
10.0000 [IU] | Freq: Two times a day (BID) | SUBCUTANEOUS | Status: DC
  Administered 2020-04-01: 10 [IU] via SUBCUTANEOUS
  Filled 2020-04-01 (×2): qty 0.1

## 2020-04-01 MED ORDER — ALBUTEROL SULFATE HFA 108 (90 BASE) MCG/ACT IN AERS: 1.0000 | INHALATION_SPRAY | RESPIRATORY_TRACT | Status: DC | PRN

## 2020-04-01 MED ORDER — INSULIN ASPART 100 UNIT/ML ~~LOC~~ SOLN
4.0000 [IU] | Freq: Three times a day (TID) | SUBCUTANEOUS | Status: DC
  Administered 2020-04-01 (×2): 4 [IU] via SUBCUTANEOUS
  Filled 2020-04-01: qty 0.04

## 2020-04-01 MED ORDER — ZINC SULFATE 220 (50 ZN) MG PO CAPS
220.0000 mg | ORAL_CAPSULE | Freq: Every day | ORAL | Status: DC
  Administered 2020-04-01 – 2020-04-04 (×4): 220 mg via ORAL
  Filled 2020-04-01 (×4): qty 1

## 2020-04-01 MED ORDER — DIPHENHYDRAMINE HCL 50 MG/ML IJ SOLN
25.0000 mg | Freq: Once | INTRAMUSCULAR | Status: AC
  Administered 2020-04-01: 25 mg via INTRAVENOUS
  Filled 2020-04-01: qty 1

## 2020-04-01 NOTE — Progress Notes (Signed)
PROGRESS NOTE    Monique Schmidt  ZOX:[id number] DOB: 05/13/1986 DOA: 03/31/2020 PCP: Patient, No Pcp Per   Chief Complaint  Patient presents with  . Shortness of Breath  . COVID+   Brief Narrative:   Monique Schmidt  is Monique Schmidt 34 y.o. female, with history of morbid obesity, diabetes mellitus type 2, presents to the ED with Monique Schmidt chief complaint of "I was diagnosed with Covid on Tuesday."  Patient reports that since her diagnosis she has not had any appetite, and her glucose has been higher than what she is used to in the 200s to 300s.  These are her primary reasons for coming in today.  She reports that she is also had some shortness of breath that is worse with exertion and better with rest.  She has had Monique Schmidt cough that is productive of green sputum and is constant throughout the day.  She reports that the cough has been getting worse over the past few days.  Patient reports chest pain associated with Monique Schmidt cough in the center of her chest.  She admits to palpitations, lightheadedness, body aches, abdominal pain, nausea-but no vomiting, loose stools 4-5 times Monique Schmidt day, and Monique Schmidt fever with Bobbette Eakes T-max of 101.  Patient has taken Motrin at home with minimal relief of her fever.  Patient reports that her diarrhea is nonbloody.  At the time of my exam she has no abdominal pain, and is eating [name] pastry.  Patient reports that her sugars have been in the 2-3 100s without eating.  She reports that this is abnormal for her and she is usually well controlled on her Metformin.  Patient takes no insulin at home.  We discussed the patient's most significant risk factor is her obesity.  We discussed healthy food choices.  Patient does not smoke cigarettes, drink alcohol, or use illicit drugs.  In the ED Temperature 100, blood pressure 113/71, heart rate 92, respiratory rate 26, satting at 96% on 2 L nasal cannula Leukopenia with Annia Gomm white blood cell count of 3.5 CHEM panel reveals hypokalemia at 3.3 Hyperglycemia at 257 Covid  markers: LDH 263, triglycerides 103, ferritin 191, CRP 3.8, lactic acid 1.2, pro-Cal less than 0.10 Chest x-ray shows perihilar and lower lobe opacities consistent with infection Patient was started on remdesivir and Decadron  Assessment & Plan:   Active Problems:   COVID-19   1. Acute respiratory failure with hypoxia 2/2 COVID 19 Pneumonia 1. Unvaccinated 2. CXR with perihilar and lower lobe opacities concerning for edema or infection 3. Currently on 3 L [place] 4. Continue remdesivir.  Solumedrol.  5. If worsens, consider baricitinib.  Discussed risks/benefits including infection/malignancy/vte (she's agreeable if needed).  At this time, I don't think adding it is necessary, will follow response after transition to solumedrol. 6. I/O, daily weights 7. OOB.  Prone as able.  IS, flutter.  COVID-19 Labs  Recent Labs    03/31/20 1820 04/01/20 0912  DDIMER 0.62* 0.94*  FERRITIN 191 198  LDH 263*  --   CRP 3.8* 4.4*    Lab Results  Component Value Date   SARSCOV2NAA POSITIVE (Monique Schmidt) 03/28/2020   2. T2DM, uncontrolled with hyperglycemia 1. On Metformin at home 2. A1c 11.6, she'll need insulin at discharge 3. SSI, levemir, mealtime insulin 4. tradjenta given evidence of benefit in COVID 19 5. 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 Morbid obesity 1. Body mass index is 49.21 kg/m. 2. Admitting provider discussed importance of healthy diet/weight loss  4.  Thrombocytopenia 1. 2/2 COVID  5. Leukopenia 1. Suspect related to acute viral infection, will continue to monitor  6. Hypokalemia 1. resolved  DVT prophylaxis: lovenox Code Status: full  Family Communication: none at bedside Disposition:   Status is: Inpatient  Remains inpatient appropriate because:Inpatient level of care appropriate due to severity of illness   Dispo: The patient is from: Home              Anticipated d/c is to: Home              Anticipated d/c date is: 3 days               Patient currently is not medically stable to d/c.  Consultants:   none  Procedures:   none  Antimicrobials:  Anti-infectives (From admission, onward)   Start     Dose/Rate Route Frequency Ordered Stop   04/01/20 1000  remdesivir 100 mg in sodium chloride 0.9 % 100 mL IVPB     Discontinue     100 mg 200 mL/hr over 30 Minutes Intravenous Daily 03/31/20 1935 04/05/20 0959   03/31/20 2045  remdesivir 100 mg in sodium chloride 0.9 % 100 mL IVPB       "And" Linked Group Details   100 mg 200 mL/hr over 30 Minutes Intravenous  Once 03/31/20 1935 03/31/20 2304   03/31/20 1945  remdesivir 100 mg in sodium chloride 0.9 % 100 mL IVPB       "And" Linked Group Details   100 mg 200 mL/hr over 30 Minutes Intravenous  Once 03/31/20 1935 03/31/20 2112     Subjective: C/o SOB  Objective: Vitals:   04/01/20 1100 04/01/20 1302 04/01/20 1533 04/01/20 1547  BP: (!) 128/93 (!) 126/93  117/81  Pulse: 76 71 84 78  Resp: 20 (!) 26 20 16   Temp:      TempSrc:      SpO2: 95% 91% 93% 91%  Weight:      Height:        Intake/Output Summary (Last 24 hours) at 04/01/2020 1630 Last data filed at 04/01/2020 04/03/2020 Gross per 24 hour  Intake 100 ml  Output --  Net 100 ml   Filed Weights   03/31/20 1731  Weight: (!) 138.3 kg    Examination:  General exam: Appears calm and comfortable, initially on phone Respiratory system: unlabored Cardiovascular system: S1 & S2 heard, RRR. Gastrointestinal system: Abdomen is nondistended, soft and nontender Central nervous system: Alert and oriented. No focal neurological deficits. Extremities: no LEE Skin: No rashes, lesions or ulcers Psychiatry: Judgement and insight appear normal. Mood & affect appropriate.     Data Reviewed: I have personally reviewed following labs and imaging studies  CBC: Recent Labs  Lab 03/28/20 1048 03/31/20 1820 04/01/20 0538  WBC 3.2* 3.5* 2.9*  NEUTROABS 1.8 2.3  --   HGB 14.2 14.1 13.6  HCT 42.5 43.5 41.9  MCV 85.3  86.0 86.0  PLT 123* 98* 106*    Basic Metabolic Panel: Recent Labs  Lab 03/28/20 1048 03/31/20 1820 04/01/20 0538  NA 134* 136 136  K 3.4* 3.3* 4.0  CL 99 96* 97*  CO2 25 28 26   GLUCOSE 340* 257* 369*  BUN <5* <5* 8  CREATININE 0.64 0.67 0.57  CALCIUM 8.3* 8.3* 8.8*  MG  --   --  2.2    GFR: Estimated Creatinine Clearance: 139.5 mL/min (by C-G formula based on SCr of 0.57 mg/dL).  Liver Function Tests: Recent Labs  Lab 03/31/20 1820 04/01/20 0538  AST 40 48*  ALT 30 35  ALKPHOS 54 57  BILITOT 0.4 0.4  PROT 7.4 7.3  ALBUMIN 3.6 3.5    CBG: Recent Labs  Lab 03/28/20 1041 04/01/20 0058 04/01/20 0830 04/01/20 1140  GLUCAP 329* 364* 284* 309*     Recent Results (from the past 240 hour(s))  SARS Coronavirus 2 by RT PCR (hospital order, performed in [facility] hospital lab) Nasopharyngeal Nasopharyngeal Swab     Status: Abnormal   Collection Time: 03/28/20 11:42 AM   Specimen: Nasopharyngeal Swab  Result Value Ref Range Status   SARS Coronavirus 2 POSITIVE (Darina Hartwell) NEGATIVE Final    Comment: RESULT CALLED TO, READ BACK BY AND VERIFIED WITH: KERI RN AT 1313 ON 03/28/20 BY S.VANHOORNE (NOTE) SARS-CoV-2 target nucleic acids are DETECTED  SARS-CoV-2 RNA is generally detectable in upper respiratory specimens  during the acute phase of infection.  Positive results are indicative  of the presence of the identified virus, but do not rule out bacterial infection or co-infection with other pathogens not detected by the test.  Clinical correlation with patient history and  other diagnostic information is necessary to determine patient infection status.  The expected result is negative.  Fact Sheet for Patients:   BoilerBrush.com.cy   Fact Sheet for Healthcare Providers:   https://pope.com/    This test is not yet approved or cleared by the [place] FDA and  has been authorized for detection and/or diagnosis of  SARS-CoV-2 by FDA under an Emergency Use Authorization (EUA).  This EUA will remain in effect (meaning t his test can be used) for the duration of  the COVID-19 declaration under Section 564(b)(1) of the Act, 21 U.S.C. section 360-bbb-3(b)(1), unless the authorization is terminated or revoked sooner.  Performed at [facility], [address], [place], [place] [address]   Blood Culture (routine x 2)     Status: None (Preliminary result)   Collection Time: 03/31/20  6:20 PM   Specimen: BLOOD  Result Value Ref Range Status   Specimen Description   Final    BLOOD RIGHT ANTECUBITAL Performed at [facility], [address] [place], [place] [address]    Special Requests   Final    BOTTLES DRAWN AEROBIC AND ANAEROBIC Blood Culture adequate volume Performed at [facility], [address], [place], [place] [address]    Culture   Final    NO GROWTH < 12 HOURS Performed at [facility], [address], [place], [place] [address]    Report Status PENDING  Incomplete  Blood Culture (routine x 2)     Status: None (Preliminary result)   Collection Time: 03/31/20  6:20 PM   Specimen: BLOOD  Result Value Ref Range Status   Specimen Description   Final    BLOOD LEFT ANTECUBITAL Performed at [facility], [address], [place], [place] [address]    Special Requests   Final    BOTTLES DRAWN AEROBIC AND ANAEROBIC Blood Culture adequate volume Performed at [facility], [address] [place], [place] [address]    Culture   Final    NO GROWTH < 12 HOURS Performed at [facility], [address], [place], [place] [address]    Report Status PENDING  Incomplete         Radiology Studies: DG Chest Portable 1 View  Result Date: 03/31/2020 CLINICAL DATA:  Shortness of breath,  COVID EXAM: PORTABLE CHEST 1 VIEW COMPARISON:  03/28/2020 FINDINGS: Low lung volumes. Cardiomegaly.  Perihilar airspace opacities and bibasilar opacities. No effusions. No acute bony abnormality. IMPRESSION: Cardiomegaly. Low lung volumes with perihilar and lower lobe opacities which could reflect edema or infection. Electronically Signed   By: Charlett Nose M.D.   On: 03/31/2020 18:44        Scheduled Meds: . heparin  5,000 Units Subcutaneous Q8H  . insulin aspart  0-20 Units Subcutaneous TID WC  . insulin aspart  0-5 Units Subcutaneous QHS  . insulin aspart  4 Units Subcutaneous TID WC  . insulin detemir  10 Units Subcutaneous BID  . linagliptin  5 mg Oral Daily  . methylPREDNISolone (SOLU-MEDROL) injection  45 mg Intravenous Q8H   Continuous Infusions: . remdesivir 100 mg in NS 100 mL Stopped (04/01/20 0938)     LOS: 1 day    Time spent: over 30 min    Lacretia Nicks, MD [facility]   To contact the attending provider between 7A-7P or the covering provider during after hours 7P-7A, please log into the web site [email or web address] and access using universal [place] password for that web site. If you do not have the password, please call the hospital operator.  04/01/2020, 4:30 PM

## 2020-04-01 NOTE — Progress Notes (Signed)
Inpatient Diabetes Program Recommendations  AACE/ADA: New Consensus Statement on Inpatient Glycemic Control (2015)  Target Ranges:  Prepandial:   less than 140 mg/dL      Peak postprandial:   less than 180 mg/dL (1-2 hours)      Critically ill patients:  140 - 180 mg/dL   Lab Results  Component Value Date   GLUCAP 320 (H) 04/01/2020   HGBA1C 11.6 (H) 03/20/2020    Review of Glycemic Control  Diabetes history: DM2 Outpatient Diabetes medications: metformin 1000 mg QAM Current orders for Inpatient glycemic control: Levemir 10 units bid, Novolog 0-20 units tidwc and hs + 4 units tidwc, tradjenta 5 mg QD  On Solumedrol 45 mg Q8H. HgbA1C - 11.6% - poor glycemic control Will need insulin teaching prior to discharge.  Inpatient Diabetes Program Recommendations:    Increase Levemir to 20 units bid (kg x 0.15 bid) Increase Novolog to 8 units tidwc.  Will order insulin pen starter kit and Living Well with Diabetes book.  Speak with pt about HgbA1C and insulin administration when appropriate.  Continue to follow closely.  Thank you. Lorenda Peck, RD, LDN, CDE Inpatient Diabetes Coordinator [phone]

## 2020-04-01 NOTE — ED Notes (Signed)
Pt given a pitcher of ice water and glass of ice water.  Trash changed.  Pt informed her breakfast tray ordered.

## 2020-04-01 NOTE — ED Notes (Signed)
Assumed care of pt at this time. Pt resting in stretcher, no s/sx of acute distress at this time.  

## 2020-04-01 NOTE — ED Notes (Signed)
Hospitalist paged regarding pt's elevated BP.  °

## 2020-04-02 ENCOUNTER — Other Ambulatory Visit: Payer: Self-pay

## 2020-04-02 DIAGNOSIS — U071 COVID-19: Secondary | ICD-10-CM | POA: Diagnosis not present

## 2020-04-02 LAB — COMPREHENSIVE METABOLIC PANEL
ALT: 27 U/L (ref 0–44)
AST: 22 U/L (ref 15–41)
Albumin: 2.8 g/dL — ABNORMAL LOW (ref 3.5–5.0)
Alkaline Phosphatase: 46 U/L (ref 38–126)
Anion gap: 8 (ref 5–15)
BUN: 13 mg/dL (ref 6–20)
CO2: 24 mmol/L (ref 22–32)
Calcium: 7.7 mg/dL — ABNORMAL LOW (ref 8.9–10.3)
Chloride: 98 mmol/L (ref 98–111)
Creatinine, Ser: 0.52 mg/dL (ref 0.44–1.00)
GFR calc Af Amer: >60 mL/min (ref >60)
GFR calc non Af Amer: >60 mL/min (ref >60)
Glucose, Bld: 371 mg/dL — ABNORMAL HIGH (ref 70–99)
Potassium: 3.1 mmol/L — ABNORMAL LOW (ref 3.5–5.1)
Sodium: 130 mmol/L — ABNORMAL LOW (ref 135–145)
Total Bilirubin: 0.3 mg/dL (ref 0.3–1.2)
Total Protein: 6.1 g/dL — ABNORMAL LOW (ref 6.5–8.1)

## 2020-04-02 LAB — CBC WITH DIFFERENTIAL/PLATELET
Abs Immature Granulocytes: 0.02 10*3/uL (ref 0.00–0.07)
Basophils Absolute: 0 10*3/uL (ref 0.0–0.1)
Basophils Relative: 0 %
Eosinophils Absolute: 0 10*3/uL (ref 0.0–0.5)
Eosinophils Relative: 0 %
HCT: 40.6 % (ref 36.0–46.0)
Hemoglobin: 13.2 g/dL (ref 12.0–15.0)
Immature Granulocytes: 1 %
Lymphocytes Relative: 21 %
Lymphs Abs: 0.8 10*3/uL (ref 0.7–4.0)
MCH: 27.8 pg (ref 26.0–34.0)
MCHC: 32.5 g/dL (ref 30.0–36.0)
MCV: 85.5 fL (ref 80.0–100.0)
Monocytes Absolute: 0.2 10*3/uL (ref 0.1–1.0)
Monocytes Relative: 5 %
Neutro Abs: 2.9 10*3/uL (ref 1.7–7.7)
Neutrophils Relative %: 73 %
Platelets: 119 10*3/uL — ABNORMAL LOW (ref 150–400)
RBC: 4.75 MIL/uL (ref 3.87–5.11)
RDW: 14 % (ref 11.5–15.5)
WBC: 3.9 10*3/uL — ABNORMAL LOW (ref 4.0–10.5)
nRBC: 0 % (ref 0.0–0.2)

## 2020-04-02 LAB — CBG MONITORING, ED
Glucose-Capillary: 349 mg/dL — ABNORMAL HIGH (ref 70–99)
Glucose-Capillary: 392 mg/dL — ABNORMAL HIGH (ref 70–99)
Glucose-Capillary: 355 mg/dL — ABNORMAL HIGH (ref 70–99)
Glucose-Capillary: 368 mg/dL — ABNORMAL HIGH (ref 70–99)

## 2020-04-02 LAB — MAGNESIUM: Magnesium: 2 mg/dL (ref 1.7–2.4)

## 2020-04-02 LAB — C-REACTIVE PROTEIN: CRP: 2 mg/dL — ABNORMAL HIGH (ref <1.0)

## 2020-04-02 LAB — D-DIMER, QUANTITATIVE: D-Dimer, Quant: 0.81 ug/mL-FEU — ABNORMAL HIGH (ref 0.00–0.50)

## 2020-04-02 LAB — FERRITIN: Ferritin: 197 ng/mL (ref 11–307)

## 2020-04-02 LAB — BRAIN NATRIURETIC PEPTIDE: B Natriuretic Peptide: 39.2 pg/mL (ref 0.0–100.0)

## 2020-04-02 LAB — PHOSPHORUS: Phosphorus: 4.2 mg/dL (ref 2.5–4.6)

## 2020-04-02 MED ORDER — INSULIN DETEMIR 100 UNIT/ML ~~LOC~~ SOLN
25.0000 [IU] | Freq: Two times a day (BID) | SUBCUTANEOUS | Status: DC
  Administered 2020-04-03: 25 [IU] via SUBCUTANEOUS
  Filled 2020-04-02 (×2): qty 0.25

## 2020-04-02 MED ORDER — INSULIN ASPART 100 UNIT/ML ~~LOC~~ SOLN
12.0000 [IU] | Freq: Three times a day (TID) | SUBCUTANEOUS | Status: DC
  Administered 2020-04-02: 12 [IU] via SUBCUTANEOUS
  Filled 2020-04-02: qty 0.12

## 2020-04-02 MED ORDER — POTASSIUM CHLORIDE CRYS ER 20 MEQ PO TBCR
40.0000 meq | EXTENDED_RELEASE_TABLET | Freq: Once | ORAL | Status: AC
  Administered 2020-04-02: 40 meq via ORAL
  Filled 2020-04-02: qty 2

## 2020-04-02 NOTE — Progress Notes (Signed)
PROGRESS NOTE    Monique Schmidt  GYF:[id number] DOB: 1985/11/27 DOA: 03/31/2020 PCP: Patient, No Pcp Per   Chief Complaint  Patient presents with   Shortness of Breath   COVID+   Brief Narrative:   Monique Schmidt  is Monique Schmidt 34 y.o. female, with history of morbid obesity, diabetes mellitus type 2, presents to the ED with Monique Schmidt chief complaint of "I was diagnosed with Covid on Tuesday."  Patient reports that since her diagnosis she has not had any appetite, and her glucose has been higher than what she is used to in the 200s to 300s.  These are her primary reasons for coming in today.  She reports that she is also had some shortness of breath that is worse with exertion and better with rest.  She has had Monique Schmidt cough that is productive of green sputum and is constant throughout the day.  She reports that the cough has been getting worse over the past few days.  Patient reports chest pain associated with Monique Schmidt cough in the center of her chest.  She admits to palpitations, lightheadedness, body aches, abdominal pain, nausea-but no vomiting, loose stools 4-5 times Monique Schmidt day, and Monique Schmidt fever with [name] T-max of 101.  Patient has taken Motrin at home with minimal relief of her fever.  Patient reports that her diarrhea is nonbloody.  At the time of my exam she has no abdominal pain, and is eating Monique Schmidt pastry.  Patient reports that her sugars have been in the 2-3 100s without eating.  She reports that this is abnormal for her and she is usually well controlled on her Metformin.  Patient takes no insulin at home.  We discussed the patient's most significant risk factor is her obesity.  We discussed healthy food choices.  Patient does not smoke cigarettes, drink alcohol, or use illicit drugs.  In the ED Temperature 100, blood pressure 113/71, heart rate 92, respiratory rate 26, satting at 96% on 2 L nasal cannula Leukopenia with Monique Schmidt white blood cell count of 3.5 CHEM panel reveals hypokalemia at 3.3 Hyperglycemia at 257 Covid  markers: LDH 263, triglycerides 103, ferritin 191, CRP 3.8, lactic acid 1.2, pro-Cal less than 0.10 Chest x-ray shows perihilar and lower lobe opacities consistent with infection Patient was started on remdesivir and Decadron  Assessment & Plan:   Active Problems:   COVID-19   1. Acute respiratory failure with hypoxia 2/2 COVID 19 Pneumonia 1. Unvaccinated 2. CXR with perihilar and lower lobe opacities concerning for edema or infection 3. Maintaining O2 sats on 3 L  4. Continue remdesivir.  Solumedrol.  5. If worsens, consider baricitinib.  Discussed risks/benefits including infection/malignancy/vte (she's agreeable if needed).  At this time, I don't think adding it is necessary, will follow response after transition to solumedrol. 6. Inflammatory markers improving, continue to monitor 7. I/O, daily weights 8. OOB.  Prone as able.  IS, flutter.  COVID-19 Labs  Recent Labs    03/31/20 1820 04/01/20 0912 04/02/20 0505  DDIMER 0.62* 0.94* 0.81*  FERRITIN 191 198 197  LDH 263*  --   --   CRP 3.8* 4.4* 2.0*    Lab Results  Component Value Date   SARSCOV2NAA POSITIVE (Monique Schmidt) 03/28/2020   2. T2DM, uncontrolled with hyperglycemia 1. On Metformin at home 2. A1c 11.6, she'll need insulin at discharge 3. SSI, levemir, mealtime insulin - continued hyperglycemia, adjust and follow 4. tradjenta given evidence of benefit in COVID 19 5. Diabetes educator with need for insulin at  discharge  3. Morbid obesity Body mass index is 49.21 kg/m. 1. Admitting provider discussed importance of healthy diet/weight loss  4. Thrombocytopenia 1. 2/2 COVID - improving, continue to monitor  5. Leukopenia 1. Suspect related to acute viral infection, will continue to monitor  6. Hyponatremia 1. Mild, continue to monitor, possibly related to covid, will follow   7. Hypokalemia 1. Replace and follow  DVT prophylaxis: lovenox Code Status: full  Family Communication: none at bedside Disposition:    Status is: Inpatient  Remains inpatient appropriate because:Inpatient level of care appropriate due to severity of illness   Dispo: The patient is from: Home              Anticipated d/c is to: Home              Anticipated d/c date is: 3 days              Patient currently is not medically stable to d/c.  Consultants:   none  Procedures:   none  Antimicrobials:  Anti-infectives (From admission, onward)   Start     Dose/Rate Route Frequency Ordered Stop   04/01/20 1000  remdesivir 100 mg in sodium chloride 0.9 % 100 mL IVPB     Discontinue     100 mg 200 mL/hr over 30 Minutes Intravenous Daily 03/31/20 1935 04/05/20 0959   03/31/20 2045  remdesivir 100 mg in sodium chloride 0.9 % 100 mL IVPB       "And" Linked Group Details   100 mg 200 mL/hr over 30 Minutes Intravenous  Once 03/31/20 1935 03/31/20 2304   03/31/20 1945  remdesivir 100 mg in sodium chloride 0.9 % 100 mL IVPB       "And" Linked Group Details   100 mg 200 mL/hr over 30 Minutes Intravenous  Once 03/31/20 1935 03/31/20 2112     Subjective: Continued SOB, some improvement in sx  Objective: Vitals:   04/02/20 0914 04/02/20 1128 04/02/20 1300 04/02/20 1347  BP: (!) 127/99 118/88 (!) 132/97 (!) 135/103  Pulse: 65 72 78 72  Resp: 18 (!) 22 18 18   Temp: 100 F (37.8 C)   100 F (37.8 C)  TempSrc: Oral   Oral  SpO2: 92% 91% 93% 95%  Weight:      Height:       No intake or output data in the 24 hours ending 04/02/20 1535 Filed Weights   03/31/20 1731  Weight: (!) 138.3 kg    Examination:  General: No acute distress. Cardiovascular: Heart sounds show Monique Schmidt regular rate, and rhythm Lungs: unlabored Abdomen: Soft, nontender, nondistended  Neurological: Alert and oriented 3. Moves all extremities 4 . Cranial nerves II through XII grossly intact. Skin: Warm and dry. No rashes or lesions. Extremities: No clubbing or cyanosis. No edema.   Data Reviewed: I have personally reviewed following labs and  imaging studies  CBC: Recent Labs  Lab 03/28/20 1048 03/31/20 1820 04/01/20 0538 04/02/20 0505  WBC 3.2* 3.5* 2.9* 3.9*  NEUTROABS 1.8 2.3  --  2.9  HGB 14.2 14.1 13.6 13.2  HCT 42.5 43.5 41.9 40.6  MCV 85.3 86.0 86.0 85.5  PLT 123* 98* 106* 119*    Basic Metabolic Panel: Recent Labs  Lab 03/28/20 1048 03/31/20 1820 04/01/20 0538 04/02/20 0505  NA 134* 136 136 130*  K 3.4* 3.3* 4.0 3.1*  CL 99 96* 97* 98  CO2 25 28 26 24   GLUCOSE 340* 257* 369* 371*  BUN <5* <5*  8 13  CREATININE 0.64 0.67 0.57 0.52  CALCIUM 8.3* 8.3* 8.8* 7.7*  MG  --   --  2.2 2.0  PHOS  --   --   --  4.2    GFR: Estimated Creatinine Clearance: 139.5 mL/min (by C-G formula based on SCr of 0.52 mg/dL).  Liver Function Tests: Recent Labs  Lab 03/31/20 1820 04/01/20 0538 04/02/20 0505  AST 40 48* 22  ALT 30 35 27  ALKPHOS 54 57 46  BILITOT 0.4 0.4 0.3  PROT 7.4 7.3 6.1*  ALBUMIN 3.6 3.5 2.8*    CBG: Recent Labs  Lab 04/01/20 1140 04/01/20 1658 04/01/20 2141 04/02/20 0756 04/02/20 1209  GLUCAP 309* 320* 398* 349* 392*     Recent Results (from the past 240 hour(s))  SARS Coronavirus 2 by RT PCR (hospital order, performed in [facility] lab) Nasopharyngeal Nasopharyngeal Swab     Status: Abnormal   Collection Time: 03/28/20 11:42 AM   Specimen: Nasopharyngeal Swab  Result Value Ref Range Status   SARS Coronavirus 2 POSITIVE (Eniya Cannady) NEGATIVE Final    Comment: RESULT CALLED TO, READ BACK BY AND VERIFIED WITH: KERI RN AT 1313 ON 03/28/20 BY S.VANHOORNE (NOTE) SARS-CoV-2 target nucleic acids are DETECTED  SARS-CoV-2 RNA is generally detectable in upper respiratory specimens  during the acute phase of infection.  Positive results are indicative  of the presence of the identified virus, but do not rule out bacterial infection or co-infection with other pathogens not detected by the test.  Clinical correlation with patient history and  other diagnostic information is necessary  to determine patient infection status.  The expected result is negative.  Fact Sheet for Patients:   BoilerBrush.com.cyhttps://www.fda.gov/media/136312/download   Fact Sheet for Healthcare Providers:   https://pope.com/https://www.fda.gov/media/136313/download    This test is not yet approved or cleared by the [place] FDA and  has been authorized for detection and/or diagnosis of SARS-CoV-2 by FDA under an Emergency Use Authorization (EUA).  This EUA will remain in effect (meaning t his test can be used) for the duration of  the COVID-19 declaration under Section 564(b)(1) of the Act, 21 U.S.C. section 360-bbb-3(b)(1), unless the authorization is terminated or revoked sooner.  Performed at [facility], [address] [place], [place] [address]   Blood Culture (routine x 2)     Status: None (Preliminary result)   Collection Time: 03/31/20  6:20 PM   Specimen: BLOOD  Result Value Ref Range Status   Specimen Description   Final    BLOOD RIGHT ANTECUBITAL Performed at [facility], [address] [place], [place] [address]    Special Requests   Final    BOTTLES DRAWN AEROBIC AND ANAEROBIC Blood Culture adequate volume Performed at [facility], [address] [place], [place] [address]    Culture   Final    NO GROWTH 2 DAYS Performed at [facility], [address] [place], [place] [address]    Report Status PENDING  Incomplete  Blood Culture (routine x 2)     Status: None (Preliminary result)   Collection Time: 03/31/20  6:20 PM   Specimen: BLOOD  Result Value Ref Range Status   Specimen Description   Final    BLOOD LEFT ANTECUBITAL Performed at [facility], [address] [place], [place] [address]    Special Requests   Final    BOTTLES DRAWN AEROBIC AND ANAEROBIC Blood Culture adequate volume Performed at [facility], [address] [place], [place] [address]    Culture   Final    NO  GROWTH 2 DAYS Performed at [facility], [address], [place], [place] [address]    Report Status PENDING  Incomplete         Radiology Studies: DG Chest Portable 1 View  Result Date: 03/31/2020 CLINICAL DATA:  Shortness of breath, COVID EXAM: PORTABLE CHEST 1 VIEW COMPARISON:  03/28/2020 FINDINGS: Low lung volumes. Cardiomegaly. Perihilar airspace opacities and bibasilar opacities. No effusions. No acute bony abnormality. IMPRESSION: Cardiomegaly. Low lung volumes with perihilar and lower lobe opacities which could reflect edema or infection. Electronically Signed   By: Charlett Nose M.D.   On: 03/31/2020 18:44        Scheduled Meds:  vitamin C  1,000 mg Oral Daily   enoxaparin (LOVENOX) injection  40 mg Subcutaneous Q12H   insulin aspart  0-20 Units Subcutaneous TID WC   insulin aspart  0-5 Units Subcutaneous QHS   insulin aspart  8 Units Subcutaneous TID WC   insulin detemir  20 Units Subcutaneous BID   linagliptin  5 mg Oral Daily   methylPREDNISolone (SOLU-MEDROL) injection  45 mg Intravenous Q8H   zinc sulfate  220 mg Oral Daily   Continuous Infusions:  remdesivir 100 mg in NS 100 mL Stopped (04/02/20 1357)     LOS: 2 days    Time spent: over 30 min    Lacretia Nicks, MD [facility]   To contact the attending provider between 7A-7P or the covering provider during after hours 7P-7A, please log into the web site [email or web address] and access using universal [place] password for that web site. If you do not have the password, please call the hospital operator.  04/02/2020, 3:35 PM

## 2020-04-03 DIAGNOSIS — J9601 Acute respiratory failure with hypoxia: Secondary | ICD-10-CM | POA: Diagnosis not present

## 2020-04-03 LAB — COMPREHENSIVE METABOLIC PANEL
ALT: 24 U/L (ref 0–44)
AST: 18 U/L (ref 15–41)
Albumin: 3.3 g/dL — ABNORMAL LOW (ref 3.5–5.0)
Alkaline Phosphatase: 49 U/L (ref 38–126)
Anion gap: 10 (ref 5–15)
BUN: 15 mg/dL (ref 6–20)
CO2: 26 mmol/L (ref 22–32)
Calcium: 9.1 mg/dL (ref 8.9–10.3)
Chloride: 98 mmol/L (ref 98–111)
Creatinine, Ser: 0.56 mg/dL (ref 0.44–1.00)
GFR calc Af Amer: >60 mL/min (ref >60)
GFR calc non Af Amer: >60 mL/min (ref >60)
Glucose, Bld: 327 mg/dL — ABNORMAL HIGH (ref 70–99)
Potassium: 3.8 mmol/L (ref 3.5–5.1)
Sodium: 134 mmol/L — ABNORMAL LOW (ref 135–145)
Total Bilirubin: 0.2 mg/dL — ABNORMAL LOW (ref 0.3–1.2)
Total Protein: 7 g/dL (ref 6.5–8.1)

## 2020-04-03 LAB — CBC WITH DIFFERENTIAL/PLATELET
Abs Immature Granulocytes: 0.07 10*3/uL (ref 0.00–0.07)
Basophils Absolute: 0 10*3/uL (ref 0.0–0.1)
Basophils Relative: 0 %
Eosinophils Absolute: 0.2 10*3/uL (ref 0.0–0.5)
Eosinophils Relative: 3 %
HCT: 41.5 % (ref 36.0–46.0)
Hemoglobin: 13.5 g/dL (ref 12.0–15.0)
Immature Granulocytes: 1 %
Lymphocytes Relative: 14 %
Lymphs Abs: 0.9 10*3/uL (ref 0.7–4.0)
MCH: 27.9 pg (ref 26.0–34.0)
MCHC: 32.5 g/dL (ref 30.0–36.0)
MCV: 85.7 fL (ref 80.0–100.0)
Monocytes Absolute: 0.4 10*3/uL (ref 0.1–1.0)
Monocytes Relative: 6 %
Neutro Abs: 5 10*3/uL (ref 1.7–7.7)
Neutrophils Relative %: 76 %
Platelets: 144 10*3/uL — ABNORMAL LOW (ref 150–400)
RBC: 4.84 MIL/uL (ref 3.87–5.11)
RDW: 14 % (ref 11.5–15.5)
WBC: 6.6 10*3/uL (ref 4.0–10.5)
nRBC: 0 % (ref 0.0–0.2)

## 2020-04-03 LAB — MAGNESIUM: Magnesium: 2.2 mg/dL (ref 1.7–2.4)

## 2020-04-03 LAB — CBG MONITORING, ED
Glucose-Capillary: 359 mg/dL — ABNORMAL HIGH (ref 70–99)
Glucose-Capillary: 374 mg/dL — ABNORMAL HIGH (ref 70–99)
Glucose-Capillary: 370 mg/dL — ABNORMAL HIGH (ref 70–99)

## 2020-04-03 LAB — PHOSPHORUS: Phosphorus: 4.8 mg/dL — ABNORMAL HIGH (ref 2.5–4.6)

## 2020-04-03 LAB — GLUCOSE, CAPILLARY: Glucose-Capillary: 339 mg/dL — ABNORMAL HIGH (ref 70–99)

## 2020-04-03 MED ORDER — ENOXAPARIN SODIUM 40 MG/0.4ML ~~LOC~~ SOLN
40.0000 mg | Freq: Two times a day (BID) | SUBCUTANEOUS | Status: DC
  Administered 2020-04-03 – 2020-04-04 (×2): 40 mg via SUBCUTANEOUS
  Filled 2020-04-03 (×2): qty 0.4

## 2020-04-03 MED ORDER — INSULIN DETEMIR 100 UNIT/ML ~~LOC~~ SOLN
30.0000 [IU] | Freq: Two times a day (BID) | SUBCUTANEOUS | Status: DC
  Administered 2020-04-03 (×2): 30 [IU] via SUBCUTANEOUS
  Filled 2020-04-03 (×3): qty 0.3

## 2020-04-03 MED ORDER — INSULIN ASPART 100 UNIT/ML ~~LOC~~ SOLN
16.0000 [IU] | Freq: Three times a day (TID) | SUBCUTANEOUS | Status: DC
  Administered 2020-04-03 (×3): 16 [IU] via SUBCUTANEOUS
  Filled 2020-04-03: qty 0.16

## 2020-04-03 NOTE — ED Notes (Signed)
Pt given meal tray, medicated as ordered. A/ox4. Denies any SOB or chest pain. Offers no complaints. Linen changed.

## 2020-04-03 NOTE — ED Notes (Signed)
Assumed care of pt at this time. Pt resting in stretcher, no s/sx of acute distress at this time.  

## 2020-04-03 NOTE — ED Notes (Signed)
Pt given fresh gown and towels/soap to wash self. Pt able to independently without issue. Tolerating RA without issue.

## 2020-04-03 NOTE — ED Notes (Signed)
Pt resting in stretcher, given towels and washcloths for morning care. Medicated as ordered, offers no complaints at this time.

## 2020-04-03 NOTE — ED Notes (Signed)
Pt resting in stretcher, aox4, NAD. Offers no complaints. Updated on POC

## 2020-04-03 NOTE — Progress Notes (Signed)
PROGRESS NOTE    RAVINDER HOFLAND  NID:[id number] DOB: 1986-04-18 DOA: 03/31/2020 PCP: Patient, No Pcp Per   Chief Complaint  Patient presents with  . Shortness of Breath  . COVID+   Brief Narrative:   Shyanna Klingel  is Faraz Ponciano 34 y.o. female, with history of morbid obesity, diabetes mellitus type 2, presents to the ED with Mamta Rimmer chief complaint of "I was diagnosed with Covid on Tuesday."  Patient reports that since her diagnosis she has not had any appetite, and her glucose has been higher than what she is used to in the 200s to 300s.  These are her primary reasons for coming in today.  She reports that she is also had some shortness of breath that is worse with exertion and better with rest.  She has had Ciana Simmon cough that is productive of green sputum and is constant throughout the day.  She reports that the cough has been getting worse over the past few days.  Patient reports chest pain associated with Oluwatimileyin Vivier cough in the center of her chest.  She admits to palpitations, lightheadedness, body aches, abdominal pain, nausea-but no vomiting, loose stools 4-5 times Markan Cazarez day, and Bethaney Oshana fever with Shantil Vallejo T-max of 101.  Patient has taken Motrin at home with minimal relief of her fever.  Patient reports that her diarrhea is nonbloody.  At the time of my exam she has no abdominal pain, and is eating Ayelet Gruenewald pastry.  Patient reports that her sugars have been in the 2-3 100s without eating.  She reports that this is abnormal for her and she is usually well controlled on her Metformin.  Patient takes no insulin at home.  We discussed the patient's most significant risk factor is her obesity.  We discussed healthy food choices.  Patient does not smoke cigarettes, drink alcohol, or use illicit drugs.  In the ED Temperature 100, blood pressure 113/71, heart rate 92, respiratory rate 26, satting at 96% on 2 L nasal cannula Leukopenia with Larsen Zettel white blood cell count of 3.5 CHEM panel reveals hypokalemia at 3.3 Hyperglycemia at 257 Covid  markers: LDH 263, triglycerides 103, ferritin 191, CRP 3.8, lactic acid 1.2, pro-Cal less than 0.10 Chest x-ray shows perihilar and lower lobe opacities consistent with infection Patient was started on remdesivir and Decadron  Assessment & Plan:   Active Problems:   COVID-19   1. Acute respiratory failure with hypoxia 2/2 COVID 19 Pneumonia 1. Unvaccinated 2. CXR with perihilar and lower lobe opacities concerning for edema or infection 3. Weaning O2 today, improving, was tolerating RA today -> will have RN do walk screen 4. Continue remdesivir.  Solumedrol.  5. If worsens, consider baricitinib.  Discussed risks/benefits including infection/malignancy/vte (she's agreeable if needed).  At this time, I don't think adding it is necessary, will follow response after transition to solumedrol. 6. Inflammatory markers improving, continue to monitor 7. I/O, daily weights 8. OOB.  Prone as able.  IS, flutter. 9. Possible d/c 8/16 (or 8/15 if doing well this afternoon).  COVID-19 Labs  Recent Labs    03/31/20 1820 04/01/20 0912 04/02/20 0505  DDIMER 0.62* 0.94* 0.81*  FERRITIN 191 198 197  LDH 263*  --   --   CRP 3.8* 4.4* 2.0*    Lab Results  Component Value Date   SARSCOV2NAA POSITIVE (Susano Cleckler) 03/28/2020   2. T2DM, uncontrolled with hyperglycemia 1. On Metformin at home 2. A1c 11.6, she'll need insulin at discharge 3. SSI, levemir, mealtime insulin - continued hyperglycemia, adjust  and follow 4. tradjenta given evidence of benefit in COVID 19 5. 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 Morbid obesity Body mass index is 49.21 kg/m. 1. Admitting provider discussed importance of healthy diet/weight loss  4. Thrombocytopenia 1. 2/2 COVID - improving, continue to monitor  5. Leukopenia 1. Resolved  6. Hyponatremia 1. Mild, continue to monitor, possibly related to covid, will follow   7. Hypokalemia 1. Replace and follow  DVT prophylaxis: lovenox Code  Status: full  Family Communication: none at bedside Disposition:   Status is: Inpatient  Remains inpatient appropriate because:Inpatient level of care appropriate due to severity of illness   Dispo: The patient is from: Home              Anticipated d/c is to: Home              Anticipated d/c date is: 3 days              Patient currently is not medically stable to d/c.  Consultants:   none  Procedures:   none  Antimicrobials:  Anti-infectives (From admission, onward)   Start     Dose/Rate Route Frequency Ordered Stop   04/01/20 1000  remdesivir 100 mg in sodium chloride 0.9 % 100 mL IVPB     Discontinue     100 mg 200 mL/hr over 30 Minutes Intravenous Daily 03/31/20 1935 04/05/20 0959   03/31/20 2045  remdesivir 100 mg in sodium chloride 0.9 % 100 mL IVPB       "And" Linked Group Details   100 mg 200 mL/hr over 30 Minutes Intravenous  Once 03/31/20 1935 03/31/20 2304   03/31/20 1945  remdesivir 100 mg in sodium chloride 0.9 % 100 mL IVPB       "And" Linked Group Details   100 mg 200 mL/hr over 30 Minutes Intravenous  Once 03/31/20 1935 03/31/20 2112     Subjective: No new complaints  Objective: Vitals:   04/03/20 0228 04/03/20 0450 04/03/20 0922 04/03/20 1054  BP: 128/87 (!) 127/97 122/81   Pulse: 75 63 82   Resp: 18 16 16    Temp: 97.8 F (36.6 C)     TempSrc: Oral     SpO2: 98% 96% 94% 91%  Weight:      Height:       No intake or output data in the 24 hours ending 04/03/20 1249 Filed Weights   03/31/20 1731  Weight: (!) 138.3 kg    Examination:  General: No acute distress. Cardiovascular: Heart sounds show Journi Moffa regular rate, and rhythm Lungs: unlabored Abdomen: Soft, nontender, nondistended  Neurological: Alert and oriented 3. Moves all extremities 4. Cranial nerves II through XII grossly intact. Skin: Warm and dry. No rashes or lesions. Extremities: No clubbing or cyanosis. No edema.    Data Reviewed: I have personally reviewed following labs  and imaging studies  CBC: Recent Labs  Lab 03/28/20 1048 03/31/20 1820 04/01/20 0538 04/02/20 0505 04/03/20 0500  WBC 3.2* 3.5* 2.9* 3.9* 6.6  NEUTROABS 1.8 2.3  --  2.9 5.0  HGB 14.2 14.1 13.6 13.2 13.5  HCT 42.5 43.5 41.9 40.6 41.5  MCV 85.3 86.0 86.0 85.5 85.7  PLT 123* 98* 106* 119* 144*    Basic Metabolic Panel: Recent Labs  Lab 03/28/20 1048 03/31/20 1820 04/01/20 0538 04/02/20 0505 04/03/20 0500  NA 134* 136 136 130* 134*  K 3.4* 3.3* 4.0 3.1* 3.8  CL 99 96* 97* 98 98  CO2  25 28 26 24 26   GLUCOSE 340* 257* 369* 371* 327*  BUN <5* <5* 8 13 15   CREATININE 0.64 0.67 0.57 0.52 0.56  CALCIUM 8.3* 8.3* 8.8* 7.7* 9.1  MG  --   --  2.2 2.0 2.2  PHOS  --   --   --  4.2 4.8*    GFR: Estimated Creatinine Clearance: 139.5 mL/min (by C-G formula based on SCr of 0.56 mg/dL).  Liver Function Tests: Recent Labs  Lab 03/31/20 1820 04/01/20 0538 04/02/20 0505 04/03/20 0500  AST 40 48* 22 18  ALT 30 35 27 24  ALKPHOS 54 57 46 49  BILITOT 0.4 0.4 0.3 0.2*  PROT 7.4 7.3 6.1* 7.0  ALBUMIN 3.6 3.5 2.8* 3.3*    CBG: Recent Labs  Lab 04/02/20 1209 04/02/20 1639 04/02/20 2147 04/03/20 0832 04/03/20 1248  GLUCAP 392* 355* 368* 359* 374*     Recent Results (from the past 240 hour(s))  SARS Coronavirus 2 by RT PCR (hospital order, performed in [facility] hospital lab) Nasopharyngeal Nasopharyngeal Swab     Status: Abnormal   Collection Time: 03/28/20 11:42 AM   Specimen: Nasopharyngeal Swab  Result Value Ref Range Status   SARS Coronavirus 2 POSITIVE (Bradey Luzier) NEGATIVE Final    Comment: RESULT CALLED TO, READ BACK BY AND VERIFIED WITH: KERI RN AT 1313 ON 03/28/20 BY S.VANHOORNE (NOTE) SARS-CoV-2 target nucleic acids are DETECTED  SARS-CoV-2 RNA is generally detectable in upper respiratory specimens  during the acute phase of infection.  Positive results are indicative  of the presence of the identified virus, but do not rule out bacterial infection or co-infection  with other pathogens not detected by the test.  Clinical correlation with patient history and  other diagnostic information is necessary to determine patient infection status.  The expected result is negative.  Fact Sheet for Patients:   05/28/20   Fact Sheet for Healthcare Providers:   05/28/20    This test is not yet approved or cleared by the [place] FDA and  has been authorized for detection and/or diagnosis of SARS-CoV-2 by FDA under an Emergency Use Authorization (EUA).  This EUA will remain in effect (meaning t his test can be used) for the duration of  the COVID-19 declaration under Section 564(b)(1) of the Act, 21 U.S.C. section 360-bbb-3(b)(1), unless the authorization is terminated or revoked sooner.  Performed at [facility], [address], [place], [place]   Blood Culture (routine x 2)     Status: None (Preliminary result)   Collection Time: 03/31/20  6:20 PM   Specimen: BLOOD  Result Value Ref Range Status   Specimen Description   Final    BLOOD RIGHT ANTECUBITAL Performed at [facility], [address] [place], [place]    Special Requests   Final    BOTTLES DRAWN AEROBIC AND ANAEROBIC Blood Culture adequate volume Performed at [facility], [address] [place], [place]    Culture   Final    NO GROWTH 3 DAYS Performed at [facility], [address] [place], [address] [place]    Report Status PENDING  Incomplete  Blood Culture (routine x 2)     Status: None (Preliminary result)   Collection Time: 03/31/20  6:20 PM   Specimen: BLOOD  Result Value Ref Range Status   Specimen Description   Final    BLOOD LEFT ANTECUBITAL Performed at [facility], [address] [place], [place] [address]    Special Requests   Final    BOTTLES DRAWN AEROBIC AND ANAEROBIC Blood Culture  adequate volume Performed at [facility], [address] [place], [place] [address]    Culture   Final    NO GROWTH 3 DAYS Performed at [facility], [address] [place], [place] [address]    Report Status PENDING  Incomplete         Radiology Studies: No results found.      Scheduled Meds: . vitamin C  1,000 mg Oral Daily  . enoxaparin (LOVENOX) injection  40 mg Subcutaneous Q12H  . insulin aspart  0-20 Units Subcutaneous TID WC  . insulin aspart  0-5 Units Subcutaneous QHS  . insulin aspart  16 Units Subcutaneous TID WC  . insulin detemir  30 Units Subcutaneous BID  . linagliptin  5 mg Oral Daily  . methylPREDNISolone (SOLU-MEDROL) injection  45 mg Intravenous Q8H  . zinc sulfate  220 mg Oral Daily   Continuous Infusions: . remdesivir 100 mg in NS 100 mL 100 mg (04/03/20 0908)     LOS: 3 days    Time spent: over 30 min    Lacretia Nicksaldwell Powell, MD [facility]   To contact the attending provider between 7A-7P or the covering provider during after hours 7P-7A, please log into the web site [email or web address] and access using universal [place] password for that web site. If you do not have the password, please call the hospital operator.  04/03/2020, 12:49 PM

## 2020-04-03 NOTE — ED Notes (Signed)
Pt resting in bed, NAD. Tolerating RA well. Offers no complaints. Eating lunch, using cell phone.

## 2020-04-04 DIAGNOSIS — U071 COVID-19: Secondary | ICD-10-CM | POA: Diagnosis not present

## 2020-04-04 DIAGNOSIS — E1165 Type 2 diabetes mellitus with hyperglycemia: Secondary | ICD-10-CM

## 2020-04-04 LAB — GLUCOSE, CAPILLARY
Glucose-Capillary: 362 mg/dL — ABNORMAL HIGH (ref 70–99)
Glucose-Capillary: 325 mg/dL — ABNORMAL HIGH (ref 70–99)

## 2020-04-04 LAB — COMPREHENSIVE METABOLIC PANEL
ALT: 22 U/L (ref 0–44)
AST: 17 U/L (ref 15–41)
Albumin: 2.9 g/dL — ABNORMAL LOW (ref 3.5–5.0)
Alkaline Phosphatase: 43 U/L (ref 38–126)
Anion gap: 10 (ref 5–15)
BUN: 14 mg/dL (ref 6–20)
CO2: 28 mmol/L (ref 22–32)
Calcium: 8.7 mg/dL — ABNORMAL LOW (ref 8.9–10.3)
Chloride: 98 mmol/L (ref 98–111)
Creatinine, Ser: 0.61 mg/dL (ref 0.44–1.00)
GFR calc Af Amer: >60 mL/min (ref >60)
GFR calc non Af Amer: >60 mL/min (ref >60)
Glucose, Bld: 374 mg/dL — ABNORMAL HIGH (ref 70–99)
Potassium: 3.8 mmol/L (ref 3.5–5.1)
Sodium: 136 mmol/L (ref 135–145)
Total Bilirubin: 0.3 mg/dL (ref 0.3–1.2)
Total Protein: 6.2 g/dL — ABNORMAL LOW (ref 6.5–8.1)

## 2020-04-04 LAB — CBC WITH DIFFERENTIAL/PLATELET
Abs Immature Granulocytes: 0.1 10*3/uL — ABNORMAL HIGH (ref 0.00–0.07)
Basophils Absolute: 0 10*3/uL (ref 0.0–0.1)
Basophils Relative: 0 %
Eosinophils Absolute: 0 10*3/uL (ref 0.0–0.5)
Eosinophils Relative: 0 %
HCT: 41.3 % (ref 36.0–46.0)
Hemoglobin: 13.5 g/dL (ref 12.0–15.0)
Immature Granulocytes: 2 %
Lymphocytes Relative: 16 %
Lymphs Abs: 1.1 10*3/uL (ref 0.7–4.0)
MCH: 27.9 pg (ref 26.0–34.0)
MCHC: 32.7 g/dL (ref 30.0–36.0)
MCV: 85.3 fL (ref 80.0–100.0)
Monocytes Absolute: 0.5 10*3/uL (ref 0.1–1.0)
Monocytes Relative: 7 %
Neutro Abs: 5.1 10*3/uL (ref 1.7–7.7)
Neutrophils Relative %: 75 %
Platelets: 147 10*3/uL — ABNORMAL LOW (ref 150–400)
RBC: 4.84 MIL/uL (ref 3.87–5.11)
RDW: 14.2 % (ref 11.5–15.5)
WBC: 6.8 10*3/uL (ref 4.0–10.5)
nRBC: 0 % (ref 0.0–0.2)

## 2020-04-04 LAB — MAGNESIUM: Magnesium: 2.3 mg/dL (ref 1.7–2.4)

## 2020-04-04 LAB — C-REACTIVE PROTEIN: CRP: 0.6 mg/dL (ref <1.0)

## 2020-04-04 LAB — LIPASE, BLOOD: Lipase: 26 U/L (ref 11–51)

## 2020-04-04 LAB — D-DIMER, QUANTITATIVE: D-Dimer, Quant: 0.7 ug/mL-FEU — ABNORMAL HIGH (ref 0.00–0.50)

## 2020-04-04 LAB — PHOSPHORUS: Phosphorus: 5.1 mg/dL — ABNORMAL HIGH (ref 2.5–4.6)

## 2020-04-04 MED ORDER — INSULIN ASPART 100 UNIT/ML ~~LOC~~ SOLN
20.0000 [IU] | Freq: Three times a day (TID) | SUBCUTANEOUS | Status: DC
  Administered 2020-04-04 (×2): 20 [IU] via SUBCUTANEOUS

## 2020-04-04 MED ORDER — POLYETHYLENE GLYCOL 3350 17 G PO PACK
17.0000 g | PACK | Freq: Every day | ORAL | Status: DC
  Administered 2020-04-04: 17 g via ORAL
  Filled 2020-04-04: qty 1

## 2020-04-04 MED ORDER — INSULIN STARTER KIT- PEN NEEDLES (ENGLISH): 1.0000 | Freq: Once | 0 refills | Status: AC

## 2020-04-04 MED ORDER — ACETAMINOPHEN 500 MG PO TABS: 1000.0000 mg | ORAL_TABLET | Freq: Three times a day (TID) | ORAL | 0 refills | Status: DC | PRN

## 2020-04-04 MED ORDER — ALBUTEROL SULFATE HFA 108 (90 BASE) MCG/ACT IN AERS: 1.0000 | INHALATION_SPRAY | RESPIRATORY_TRACT | 0 refills | Status: DC | PRN

## 2020-04-04 MED ORDER — INSULIN DETEMIR 100 UNIT/ML ~~LOC~~ SOLN
35.0000 [IU] | Freq: Two times a day (BID) | SUBCUTANEOUS | Status: DC
  Administered 2020-04-04: 35 [IU] via SUBCUTANEOUS
  Filled 2020-04-04 (×2): qty 0.35

## 2020-04-04 MED ORDER — BLOOD GLUCOSE MONITOR KIT: PACK | 0 refills | Status: DC

## 2020-04-04 MED ORDER — INSULIN STARTER KIT- PEN NEEDLES (ENGLISH)
1.0000 | Freq: Once | Status: AC
  Administered 2020-04-04: 1
  Filled 2020-04-04: qty 1

## 2020-04-04 MED ORDER — INSULIN PEN NEEDLE 31G X 5 MM MISC: 3 refills | Status: DC

## 2020-04-04 MED ORDER — METFORMIN HCL ER 500 MG PO TB24: 1000.0000 mg | ORAL_TABLET | Freq: Two times a day (BID) | ORAL | 1 refills | Status: DC

## 2020-04-04 MED ORDER — INSULIN ASPART PROT & ASPART (70-30 MIX) 100 UNIT/ML PEN
50.0000 [IU] | PEN_INJECTOR | Freq: Two times a day (BID) | SUBCUTANEOUS | 11 refills | Status: DC
Start: 2020-04-04 — End: 2021-04-03

## 2020-04-04 NOTE — Discharge Instructions (Signed)
10 Things You Can Do to Manage Your COVID-19 Symptoms at Home If you have possible or confirmed COVID-19: 1. Stay home from work and school. And stay away from other public places. If you must go out, avoid using any kind of public transportation, ridesharing, or taxis. 2. Monitor your symptoms carefully. If your symptoms get worse, call your healthcare provider immediately. 3. Get rest and stay hydrated. 4. If you have a medical appointment, call the healthcare provider ahead of time and tell them that you have or may have COVID-19. 5. For medical emergencies, call 911 and notify the dispatch personnel that you have or may have COVID-19. 6. Cover your cough and sneezes with a tissue or use the inside of your elbow. 7. Wash your hands often with soap and water for at least 20 seconds or clean your hands with an alcohol-based hand sanitizer that contains at least 60% alcohol. 8. As much as possible, stay in a specific room and away from other people in your home. Also, you should use a separate bathroom, if available. If you need to be around other people in or outside of the home, wear a mask. 9. Avoid sharing personal items with other people in your household, like dishes, towels, and bedding. 10. Clean all surfaces that are touched often, like counters, tabletops, and doorknobs. Use household cleaning sprays or wipes according to the label instructions. [employer].co.uk[place]c.gov/coronavirus 02/18/2019 This information is not intended to replace advice given to you by your health care provider. Make sure you discuss any questions you have with your health care provider. Document Revised: 07/23/2019 Document Reviewed: 07/23/2019 Elsevier Patient Education  2020 [employer] Quarantine until 04/18/20.   COVID-19: How to Protect Yourself and Others Know how it spreads  There is currently no vaccine to prevent coronavirus disease 2019 (COVID-19).  The best way to prevent illness is to avoid being exposed to  this virus.  The virus is thought to spread mainly from person-to-person. ? Between people who are in close contact with one another (within about 6 feet). ? Through respiratory droplets produced when an infected person coughs, sneezes or talks. ? These droplets can land in the mouths or noses of people who are nearby or possibly be inhaled into the lungs. ? COVID-19 may be spread by people who are not showing symptoms. Everyone should Clean your hands often  Wash your hands often with soap and water for at least 20 seconds especially after you have been in a public place, or after blowing your nose, coughing, or sneezing.  If soap and water are not readily available, use a hand sanitizer that contains at least 60% alcohol. Cover all surfaces of your hands and rub them together until they feel dry.  Avoid touching your eyes, nose, and mouth with unwashed hands. Avoid close contact  Limit contact with others as much as possible.  Avoid close contact with people who are sick.  Put distance between yourself and other people. ? Remember that some people without symptoms may be able to spread virus. ? This is especially important for people who are at higher risk of getting very [employer].www.cdc.gov/coronavirus/2019-ncov/need-extra-precautions/people-at-higher-risk.html Cover your mouth and nose with a mask when around others  You could spread COVID-19 to others even if you do not feel sick.  Everyone should wear a mask in public settings and when around people not living in their household, especially when social distancing is difficult to maintain. ? Masks should not be placed on young children under  age 22, anyone who has trouble breathing, or is unconscious, incapacitated or otherwise unable to remove the mask without assistance.  The mask is meant to protect other people in case you are infected.  Do NOT use a facemask meant for a Research scientist (physical sciences).  Continue to keep about 6 feet  between yourself and others. The mask is not a substitute for social distancing. Cover coughs and sneezes  Always cover your mouth and nose with a tissue when you cough or sneeze or use the inside of your elbow.  Throw used tissues in the trash.  Immediately wash your hands with soap and water for at least 20 seconds. If soap and water are not readily available, clean your hands with a hand sanitizer that contains at least 60% alcohol. Clean and disinfect  Clean AND disinfect frequently touched surfaces daily. This includes tables, doorknobs, light switches, countertops, handles, desks, phones, keyboards, toilets, faucets, and sinks. [email or web address].[email or web address]  If surfaces are dirty, clean them: Use detergent or soap and water prior to disinfection.  Then, use a household disinfectant. You can see a list of EPA-registered household disinfectants here. [place].co.uk 04/22/2019 This information is not intended to replace advice given to you by your health care provider. Make sure you discuss any questions you have with your health care provider. Document Revised: 04/30/2019 Document Reviewed: 02/26/2019 Elsevier Patient Education  2020 Elsevier Inc.     Person Under Monitoring Name: Monique Schmidt  Location: [address] [place] [address]   Infection Prevention Recommendations for Individuals Confirmed to have, or Being Evaluated for, 2019 Novel Coronavirus (COVID-19) Infection Who Receive Care at Home  Individuals who are confirmed to have, or are being evaluated for, COVID-19 should follow the prevention steps below until a healthcare provider or local or state health department says they can return to normal activities.  Stay home except to get medical care You should restrict activities outside your home, except for getting medical care. Do not go to work, school, or public areas, and do not use  public transportation or taxis.  Call ahead before visiting your doctor Before your medical appointment, call the healthcare provider and tell them that you have, or are being evaluated for, COVID-19 infection. This will help the healthcare provider's office take steps to keep other people from getting infected. Ask your healthcare provider to call the local or state health department.  Monitor your symptoms Seek prompt medical attention if your illness is worsening (e.g., difficulty breathing). Before going to your medical appointment, call the healthcare provider and tell them that you have, or are being evaluated for, COVID-19 infection. Ask your healthcare provider to call the local or state health department.  Wear a facemask You should wear a facemask that covers your nose and mouth when you are in the same room with other people and when you visit a healthcare provider. People who live with or visit you should also wear a facemask while they are in the same room with you.  Separate yourself from other people in your home As much as possible, you should stay in a different room from other people in your home. Also, you should use a separate bathroom, if available.  Avoid sharing household items You should not share dishes, drinking glasses, cups, eating utensils, towels, bedding, or other items with other people in your home. After using these items, you should wash them thoroughly with soap and water.  Cover your coughs and sneezes Cover your mouth and nose  with a tissue when you cough or sneeze, or you can cough or sneeze into your sleeve. Throw used tissues in a lined trash can, and immediately wash your hands with soap and water for at least 20 seconds or use an alcohol-based hand rub.  Wash your [employer] your hands often and thoroughly with soap and water for at least 20 seconds. You can use an alcohol-based hand sanitizer if soap and water are not available and if your  hands are not visibly dirty. Avoid touching your eyes, nose, and mouth with unwashed hands.   Prevention Steps for Caregivers and Household Members of Individuals Confirmed to have, or Being Evaluated for, COVID-19 Infection Being Cared for in the Home  If you live with, or provide care at home for, a person confirmed to have, or being evaluated for, COVID-19 infection please follow these guidelines to prevent infection:  Follow healthcare provider's instructions Make sure that you understand and can help the patient follow any healthcare provider instructions for all care.  Provide for the patient's basic needs You should help the patient with basic needs in the home and provide support for getting groceries, prescriptions, and other personal needs.  Monitor the patient's symptoms If they are getting sicker, call his or her medical provider and tell them that the patient has, or is being evaluated for, COVID-19 infection. This will help the healthcare provider's office take steps to keep other people from getting infected. Ask the healthcare provider to call the local or state health department.  Limit the number of people who have contact with the patient  If possible, have only one caregiver for the patient.  Other household members should stay in another home or place of residence. If this is not possible, they should stay  in another room, or be separated from the patient as much as possible. Use a separate bathroom, if available.  Restrict visitors who do not have an essential need to be in the home.  Keep older adults, very young children, and other sick people away from the patient Keep older adults, very young children, and those who have compromised immune systems or chronic health conditions away from the patient. This includes people with chronic heart, lung, or kidney conditions, diabetes, and cancer.  Ensure good ventilation Make sure that shared spaces in the home  have good air flow, such as from an air conditioner or an opened window, weather permitting.  Wash your hands often  Wash your hands often and thoroughly with soap and water for at least 20 seconds. You can use an alcohol based hand sanitizer if soap and water are not available and if your hands are not visibly dirty.  Avoid touching your eyes, nose, and mouth with unwashed hands.  Use disposable paper towels to dry your hands. If not available, use dedicated cloth towels and replace them when they become wet.  Wear a facemask and gloves  Wear a disposable facemask at all times in the room and gloves when you touch or have contact with the patient's blood, body fluids, and/or secretions or excretions, such as sweat, saliva, sputum, nasal mucus, vomit, urine, or feces.  Ensure the mask fits over your nose and mouth tightly, and do not touch it during use.  Throw out disposable facemasks and gloves after using them. Do not reuse.  Wash your hands immediately after removing your facemask and gloves.  If your personal clothing becomes contaminated, carefully remove clothing and launder. Wash your hands after  handling contaminated clothing.  Place all used disposable facemasks, gloves, and other waste in a lined container before disposing them with other household waste.  Remove gloves and wash your hands immediately after handling these items.  Do not share dishes, glasses, or other household items with the patient  Avoid sharing household items. You should not share dishes, drinking glasses, cups, eating utensils, towels, bedding, or other items with a patient who is confirmed to have, or being evaluated for, COVID-19 infection.  After the person uses these items, you should wash them thoroughly with soap and water.  Wash laundry thoroughly  Immediately remove and wash clothes or bedding that have blood, body fluids, and/or secretions or excretions, such as sweat, saliva, sputum, nasal  mucus, vomit, urine, or feces, on them.  Wear gloves when handling laundry from the patient.  Read and follow directions on labels of laundry or clothing items and detergent. In general, wash and dry with the warmest temperatures recommended on the label.  Clean all areas the individual has used often  Clean all touchable surfaces, such as counters, tabletops, doorknobs, bathroom fixtures, toilets, phones, keyboards, tablets, and bedside tables, every day. Also, clean any surfaces that may have blood, body fluids, and/or secretions or excretions on them.  Wear gloves when cleaning surfaces the patient has come in contact with.  Use a diluted bleach solution (e.g., dilute bleach with 1 part bleach and 10 parts water) or a household disinfectant with a label that says EPA-registered for coronaviruses. To make a bleach solution at home, add 1 tablespoon of bleach to 1 quart (4 cups) of water. For a larger supply, add  cup of bleach to 1 gallon (16 cups) of water.  Read labels of cleaning products and follow recommendations provided on product labels. Labels contain instructions for safe and effective use of the cleaning product including precautions you should take when applying the product, such as wearing gloves or eye protection and making sure you have good ventilation during use of the product.  Remove gloves and wash hands immediately after cleaning.  Monitor yourself for signs and symptoms of illness Caregivers and household members are considered close contacts, should monitor their health, and will be asked to limit movement outside of the home to the extent possible. Follow the monitoring steps for close contacts listed on the symptom monitoring form.   ? If you have additional questions, contact your local health department or call the epidemiologist on call at [phone] (available 24/7). ? This guidance is subject to change. For the most up-to-date guidance from [facility], please  refer to their website: [email or web address]

## 2020-04-04 NOTE — Progress Notes (Signed)
Inpatient Diabetes Program Recommendations  AACE/ADA: New Consensus Statement on Inpatient Glycemic Control (2015)  Target Ranges:  Prepandial:   less than 140 mg/dL      Peak postprandial:   less than 180 mg/dL (1-2 hours)      Critically ill patients:  140 - 180 mg/dL   Lab Results  Component Value Date   GLUCAP 325 (H) 04/04/2020   HGBA1C 11.6 (H) 03/20/2020    Review of Glycemic Control  Diabetes history: New-onset DM, hx GDM Outpatient Diabetes medications: metformin 500 mg QD Current orders for Inpatient glycemic control: Levemir 35 units bid, Novolog 20 units tidwc + 0-20 units tidwc and hs, tradjenta 5 mg QD  HgbA1C - 11.6%  Inpatient Diabetes Program Recommendations:     Novolog 70/30 50 units bid Metformin 1000 mg bid Tradjenta 5 mg QD  Has appt at [facility] to establish PCP for diabetes management.  Spoke with pt about diabetes diagnosis and going home on insulin. Pt states she took insulin when diagnosed with GDM several years ago. Discussed glucose monitoring at least 3x/day and eating 3 meals/day. Pt states she normally eats only 1 meal/day, but will start eating 3 meals. Gave several recs for breakfast and lunch low CHO high protein choices. Discussed importance of getting HgbA1C down to around 7%. Has instructions for insulin pen administration. Answered all questions.   Discussed above with MD and RN.  Thank you. Ailene Ards, RD, LDN, CDE Inpatient Diabetes Coordinator [phone]

## 2020-04-04 NOTE — Progress Notes (Signed)
Pt resting hr 72, RA oxygen sat 96 Ambulating hr 80, RA oxygen sat 98

## 2020-04-04 NOTE — Discharge Summary (Signed)
Physician Discharge Summary  Monique Schmidt:[id number] DOB: April 26, 1986 DOA: 03/31/2020  PCP: Patient, No Pcp Per  Admit date: 03/31/2020 Discharge date: 04/04/2020  Time spent: 40 minutes  Recommendations for Outpatient Follow-up:  1. Follow outpatient CBC/CMP 2. Follow CDC quarantine guidelines (quarantine until 04/18/20) 3. Follow diabetes and blood sugars outpatient - adjust as needed - started on insulin  Discharge Diagnoses:  Active Problems:   COVID-19   Discharge Condition: stable  Diet recommendation: heart healthy  Filed Weights   03/31/20 1731 04/04/20 0408  Weight: (!) 138.3 kg (!) 138.2 kg    History of present illness:  LatashaMilleris a34 y.o.female,with history of morbid obesity, diabetes mellitus type 2, presents to the ED with Monique Schmidt chief complaint of "I was diagnosed with Covid on Tuesday." Patient reports that since her diagnosis she has not had any appetite, and her glucose has been higher than what she is used to in the 200s to 300s.These are her primary reasons for coming in today. She reports that she is also had some shortness of breath that is worse with exertion and better with rest. She has had Monique Schmidt cough that is productive of green sputum and is constant throughout the day. She reports that the cough has been getting worse over the past few days. Patient reports chest pain associated with Monique Schmidt cough in the center of her chest. She admits to palpitations, lightheadedness, body aches, abdominal pain, nausea-but no vomiting, loose stools 4-5 times Monique Schmidt day, and Monique Schmidt fever with Monique Schmidt T-max of 101. Patient has taken Motrin at home with minimal relief of her fever. Patient reports that her diarrhea is nonbloody. At the time of my exam she has no abdominal pain, and is eating [name] pastry.  Patient reports that her sugars have been in the 2-3 100s without eating. She reports that this is abnormal for her and she is usually well controlled on her Metformin. Patient  takes no insulin at home.  We discussed the patient's most significant risk factor is her obesity. We discussed healthy food choices.  Patient does not smoke cigarettes, drink alcohol, or use illicit drugs.  In the ED Temperature 100, blood pressure 113/71, heart rate 92, respiratory rate 26, satting at 96% on 2 L nasal cannula Leukopenia with Shahla Betsill white blood cell count of 3.5 CHEM panel reveals hypokalemia at 3.3 Hyperglycemia at 257 Covid markers:LDH 263, triglycerides 103, ferritin 191, CRP 3.8, lactic acid 1.2, pro-Cal less than 0.10 Chest x-ray shows perihilar and lower lobe opacities consistent with infection Patient was started on remdesivir and Decadron  She was admitted for COVID 19 pneumonia.  She's improved with steroids and remdesivir.  She was started on insulin for hyperglycemia and this was prescribed at discharge as well.  She was discharged on 8/16 in stable condition.  Hospital Course:  1. Acute respiratory failure with hypoxia 2/2 COVID 19 Pneumonia 1. Unvaccinated 2. CXR with perihilar and lower lobe opacities concerning for edema or infection 3. Satting well on RA with ambulation  4. Completed 5 days remdesivir and steroids 5. Inflammatory markers improving, continue to monitor 6. I/O, daily weights 7. OOB.  Prone as able.  IS, flutter.  COVID-19 Labs  Recent Labs    04/02/20 0505 04/04/20 0925  DDIMER 0.81* 0.70*  FERRITIN 197  --   CRP 2.0* 0.6    Lab Results  Component Value Date   SARSCOV2NAA POSITIVE (Monique Schmidt) 03/28/2020   2. T2DM, uncontrolled with hyperglycemia 1. On Metformin at home - continue this  2. Still with hyperglycemia, will discharge with 50 units 70/30 BID, needs close follow up as she'll be coming off steroids and will likely need some adjustment, but BG's remain poorly controlled today 3. A1c 11.6, she'll need insulin at discharge 4. tradjenta given evidence of benefit in COVID 19 5. Diabetes educator with need for insulin at  discharge  3. Morbid obesity 1. Body mass index is 49.21 kg/m. 1. Admitting provider discussed importance of healthy diet/weight loss  4. Thrombocytopenia 1. 2/2 COVID - improving, continue to monitor  5. Leukopenia 1. Resolved  6. Hyponatremia 1. Mild, continue to monitor, possibly related to covid, will follow   7. Hypokalemia 1. Replace and follow  Procedures:  none  Consultations:  none  Discharge Exam: Vitals:   04/04/20 0408 04/04/20 0750  BP: 129/77 139/81  Pulse: 63 (!) 51  Resp: 20 19  Temp: 98 F (36.7 C) 98.6 F (37 C)  SpO2: 95% 94%   No new complaints Eager for discharge Notes some abd discomfort first thing in AM when her stomach is empty  General: No acute distress. Cardiovascular: Heart sounds show Monique Schmidt regular rate, and rhythm.  Lungs: Clear to auscultation bilaterally Abdomen: Soft, nontender, nondistended.  No sig TTP on exam. Neurological: Alert and oriented 3. Moves all extremities 4. Cranial nerves II through XII grossly intact. Skin: Warm and dry. No rashes or lesions. Extremities: No clubbing or cyanosis. No edema  Discharge Instructions   Discharge Instructions    Call MD for:  difficulty breathing, headache or visual disturbances   Complete by: As directed    Call MD for:  extreme fatigue   Complete by: As directed    Call MD for:  hives   Complete by: As directed    Call MD for:  persistant dizziness or light-headedness   Complete by: As directed    Call MD for:  persistant nausea and vomiting   Complete by: As directed    Call MD for:  redness, tenderness, or signs of infection (pain, swelling, redness, odor or green/yellow discharge around incision site)   Complete by: As directed    Call MD for:  severe uncontrolled pain   Complete by: As directed    Call MD for:  temperature >100.4   Complete by: As directed    Diet - low sodium heart healthy   Complete by: As directed    Discharge instructions   Complete by:  As directed    You were seen for COVID 19 pneumonia.  You've improved with steroids and remdesivir.  You're now on room air.  You need to quarantine until 04/18/2020.  You can stop your quarantine after this date if you have been improving and haven't needed to use any fever reducing medicines.  Your blood sugars are high.  They'll probably improve some off of steroids, but you're going to need insulin going home.  We'll send you home on twice daily insulin injections (50 units twice daily).  Watch for hypoglycemia (low blood sugars) as you come off your steroids.  If you notice low blood sugars, call your PCP to discuss adjusting your insulin regimen.  You'll need to check your blood sugar at least 3 times daily before meals.  Please keep Delcia Spitzley log of this and bring it to your PCP's office to discuss at your follow up appointment.  Restart your metformin.  Gradually uptitrate this.  Take 500 mg daily for 7 days, then increase to 500 mg twice daily for 7  days, then increase to 1000 mg in the morning and 500 mg at night for 7 days, and finally increase to 1000 mg twice daily.  If you have abdominal discomfort or stomach upset, remain at the dose where you didn't have symptoms.  Return for new, recurrent, or worsening symptoms.  Please ask your PCP to request records from this hospitalization so they know what was done and what the next steps will be.   Increase activity slowly   Complete by: As directed      Allergies as of 04/04/2020      Reactions   Latex Itching, Rash   Penicillins Itching, Rash   Did it involve swelling of the face/tongue/throat, SOB, or low BP? N Did it involve sudden or severe rash/hives, skin peeling, or any reaction on the inside of your mouth or nose? Y Did you need to seek medical attention at [facility] or doctor's office? No When did it last happen?Several Years Ago If all above answers are "NO", may proceed with cephalosporin use.      Medication List     STOP taking these medications   metroNIDAZOLE 500 MG tablet Commonly known as: FLAGYL   naproxen 500 MG tablet Commonly known as: NAPROSYN     TAKE these medications   acetaminophen 500 MG tablet Commonly known as: TYLENOL Take 2 tablets (1,000 mg total) by mouth every 8 (eight) hours as needed for mild pain or headache. What changed: when to take this   albuterol 108 (90 Base) MCG/ACT inhaler Commonly known as: VENTOLIN HFA Inhale 1-2 puffs into the lungs every 4 (four) hours as needed for wheezing or shortness of breath.   benzonatate 100 MG capsule Commonly known as: TESSALON Take 1 capsule (100 mg total) by mouth every 8 (eight) hours.   blood glucose meter kit and supplies Kit Dispense based on patient and insurance preference. Use up to four times daily as directed. (FOR ICD-9 250.00, 250.01). What changed: Another medication with the same name was added. Make sure you understand how and when to take each.   blood glucose meter kit and supplies Kit Dispense based on patient and insurance preference. Use up to four times daily as directed. (FOR ICD-9 250.00, 250.01). What changed: You were already taking Yoana Staib medication with the same name, and this prescription was added. Make sure you understand how and when to take each.   insulin aspart protamine - aspart (70-30) 100 UNIT/ML FlexPen Commonly known as: NOVOLOG 70/30 MIX Inject 0.5 mLs (50 Units total) into the skin 2 (two) times daily.   insulin starter kit- pen needles Misc 1 kit by Other route once for 1 dose.   metFORMIN 500 MG 24 hr tablet Commonly known as: GLUCOPHAGE-XR Take 2 tablets (1,000 mg total) by mouth 2 (two) times daily. Gradually increase your metformin dose (see discharge instructions) What changed:   how much to take  how to take this  when to take this  additional instructions   NYQUIL PO Take 30 mLs by mouth at bedtime as needed (cough).   nystatin cream Commonly known as:  MYCOSTATIN Apply to affected area 2 times daily   nystatin powder Commonly known as: MYCOSTATIN/NYSTOP Apply 1 application topically 3 (three) times daily.   nystatin-triamcinolone ointment Commonly known as: MYCOLOG Apply 1 application topically 2 (two) times daily.   tiZANidine 4 MG tablet Commonly known as: Zanaflex Take 1-2 tablets (4-8 mg total) by mouth every 6 (six) hours as needed for muscle spasms.  Allergies  Allergen Reactions  . Latex Itching and Rash  . Penicillins Itching and Rash    Did it involve swelling of the face/tongue/throat, SOB, or low BP? N Did it involve sudden or severe rash/hives, skin peeling, or any reaction on the inside of your mouth or nose? Y Did you need to seek medical attention at [facility] hospital or doctor's office? No When did it last happen?Several Years Ago If all above answers are "NO", may proceed with cephalosporin use.          The results of significant diagnostics from this hospitalization (including imaging, microbiology, ancillary and laboratory) are listed below for reference.    Significant Diagnostic Studies: DG Chest Portable 1 View  Result Date: 03/31/2020 CLINICAL DATA:  Shortness of breath, COVID EXAM: PORTABLE CHEST 1 VIEW COMPARISON:  03/28/2020 FINDINGS: Low lung volumes. Cardiomegaly. Perihilar airspace opacities and bibasilar opacities. No effusions. No acute bony abnormality. IMPRESSION: Cardiomegaly. Low lung volumes with perihilar and lower lobe opacities which could reflect edema or infection. Electronically Signed   By: Rolm Baptise M.D.   On: 03/31/2020 18:44   DG Chest Port 1 View  Result Date: 03/28/2020 CLINICAL DATA:  Cough and fever EXAM: PORTABLE CHEST 1 VIEW COMPARISON:  July 20, 2011 FINDINGS: Lungs are clear. Heart size and pulmonary vascularity are normal. No adenopathy. No bone lesions. IMPRESSION: Lungs clear.  Cardiac silhouette within normal limits. Electronically Signed   By: Lowella Grip III M.D.   On: 03/28/2020 11:39    Microbiology: Recent Results (from the past 240 hour(s))  SARS Coronavirus 2 by RT PCR (hospital order, performed in [facility] hospital lab) Nasopharyngeal Nasopharyngeal Swab     Status: Abnormal   Collection Time: 03/28/20 11:42 AM   Specimen: Nasopharyngeal Swab  Result Value Ref Range Status   SARS Coronavirus 2 POSITIVE (Joell Usman) NEGATIVE Final    Comment: RESULT CALLED TO, READ BACK BY AND VERIFIED WITH: KERI RN AT 5631 ON 03/28/20 BY S.VANHOORNE (NOTE) SARS-CoV-2 target nucleic acids are DETECTED  SARS-CoV-2 RNA is generally detectable in upper respiratory specimens  during the acute phase of infection.  Positive results are indicative  of the presence of the identified virus, but do not rule out bacterial infection or co-infection with other pathogens not detected by the test.  Clinical correlation with patient history and  other diagnostic information is necessary to determine patient infection status.  The expected result is negative.  Fact Sheet for Patients:   StrictlyIdeas.no   Fact Sheet for Healthcare Providers:   BankingDealers.co.za    This test is not yet approved or cleared by the [place] FDA and  has been authorized for detection and/or diagnosis of SARS-CoV-2 by FDA under an Emergency Use Authorization (EUA).  This EUA will remain in effect (meaning t his test can be used) for the duration of  the COVID-19 declaration under Section 564(b)(1) of the Act, 21 U.S.C. section 360-bbb-3(b)(1), unless the authorization is terminated or revoked sooner.  Performed at [facility], [place] [address] [place], [place] [address]   Blood Culture (routine x 2)     Status: None (Preliminary result)   Collection Time: 03/31/20  6:20 PM   Specimen: BLOOD  Result Value Ref Range Status   Specimen Description   Final    BLOOD RIGHT ANTECUBITAL Performed at [place] [address] [place], [place] [address]    Special Requests   Final    BOTTLES DRAWN AEROBIC  AND ANAEROBIC Blood Culture adequate volume Performed at [place] [address] [place], [place] [address]    Culture   Final    NO GROWTH 4 DAYS Performed at [facility], [place] [address], [place], [place] [address]    Report Status PENDING  Incomplete  Blood Culture (routine x 2)     Status: None (Preliminary result)   Collection Time: 03/31/20  6:20 PM   Specimen: BLOOD  Result Value Ref Range Status   Specimen Description   Final    BLOOD LEFT ANTECUBITAL Performed at [facility] [address], [place], [place] [address]    Special Requests   Final    BOTTLES DRAWN AEROBIC AND ANAEROBIC Blood Culture adequate volume Performed at [facility] [address], [place], [place] [address]    Culture   Final    NO GROWTH 4 DAYS Performed at [facility], [place] [address] [place], [place] [address]    Report Status PENDING  Incomplete     Labs: Basic Metabolic Panel: Recent Labs  Lab 03/31/20 1820 04/01/20 0538 04/02/20 0505 04/03/20 0500 04/04/20 0925  NA 136 136 130* 134* 136  K 3.3* 4.0 3.1* 3.8 3.8  CL 96* 97* 98 98 98  CO2 _0 GLUCOSE 257* 369* 371* 327* 374*  BUN <5* _1 CREATININE 0.67 0.57 0.52 0.56 0.61  CALCIUM 8.3* 8.8* 7.7* 9.1 8.7*  MG  --  2.2 2.0 2.2 2.3  PHOS  --   --  4.2 4.8* 5.1*   Liver Function Tests: Recent Labs  Lab 03/31/20 1820 04/01/20 0538 04/02/20 0505 04/03/20 0500 04/04/20 0925  AST 40 48* _2 ALT 30 35 _3 ALKPHOS 54 57 46 49 43  BILITOT 0.4 0.4 0.3 0.2* 0.3  PROT 7.4 7.3 6.1* 7.0 6.2*  ALBUMIN 3.6 3.5 2.8* 3.3* 2.9*   Recent Labs  Lab 04/04/20 0925  LIPASE 26   No results for input(s): AMMONIA in the last 168 hours. CBC: Recent Labs  Lab 03/31/20 1820 04/01/20 0538 04/02/20 0505  04/03/20 0500 04/04/20 0925  WBC 3.5* 2.9* 3.9* 6.6 6.8  NEUTROABS 2.3  --  2.9 5.0 5.1  HGB 14.1 13.6 13.2 13.5 13.5  HCT 43.5 41.9 40.6 41.5 41.3  MCV 86.0 86.0 85.5 85.7 85.3  PLT 98* 106* 119* 144* 147*   Cardiac Enzymes: No results for input(s): CKTOTAL, CKMB, CKMBINDEX, TROPONINI in the last 168 hours. BNP: BNP (last 3 results) Recent Labs    04/02/20 0505  BNP 39.2    ProBNP (last 3 results) No results for input(s): PROBNP in the last 8760 hours.  CBG: Recent Labs  Lab 04/03/20 1248 04/03/20 1659 04/03/20 2055 04/04/20 0747 04/04/20 1134  GLUCAP 374* 370* 339* 362* 325*       Signed:  Fayrene Helper MD.  [facility] 04/04/2020, 12:52 PM

## 2020-04-04 NOTE — TOC Transition Note (Signed)
Transition of Care [facility]) - CM/SW Discharge Note   Patient Details  Name: Monique Schmidt MRN: [id number] Date of Birth: 23-Nov-1985  [facility] of [facility] CM/SW Contact:  Ida Rogue, LCSW Phone Number: 04/04/2020, 2:42 PM   Clinical Narrative:   Got patient a hospital follow up appointment with [facility] per MD consult, RN confirmation and request.  TOC sign off.    Final next level of care: Home/Self Care Barriers to Discharge: No Barriers Identified   Patient Goals and CMS Choice        Discharge Placement                       Discharge Plan and Services                                     Social Determinants of Health (SDOH) Interventions     Readmission Risk Interventions No flowsheet data found.

## 2020-04-05 LAB — CULTURE, BLOOD (ROUTINE X 2)
Culture: NO GROWTH
Culture: NO GROWTH
Special Requests: ADEQUATE
Special Requests: ADEQUATE

## 2020-04-15 ENCOUNTER — Other Ambulatory Visit: Payer: Self-pay

## 2020-04-15 ENCOUNTER — Telehealth (INDEPENDENT_AMBULATORY_CARE_PROVIDER_SITE_OTHER): Payer: Medicaid Other | Admitting: Primary Care

## 2020-04-15 DIAGNOSIS — E119 Type 2 diabetes mellitus without complications: Secondary | ICD-10-CM

## 2020-04-15 DIAGNOSIS — U071 COVID-19: Secondary | ICD-10-CM | POA: Diagnosis not present

## 2020-04-15 DIAGNOSIS — Z794 Long term (current) use of insulin: Secondary | ICD-10-CM

## 2020-04-15 DIAGNOSIS — Z09 Encounter for follow-up examination after completed treatment for conditions other than malignant neoplasm: Secondary | ICD-10-CM

## 2020-04-15 DIAGNOSIS — Z7689 Persons encountering health services in other specified circumstances: Secondary | ICD-10-CM

## 2020-04-15 NOTE — Progress Notes (Signed)
I connected with Monique Schmidt by telephone and verified that I am speaking with the correct person using two identifiers. Patient is at home in her bed  I discussed the limitations, risks, security and privacy concerns of performing an evaluation and management service by telephone and the availability of in person appointments. I also discussed with the patient that there may be a patient responsible charge related to this service. The patient expressed understanding and agreed to proceed.  Juluis Mire Np-C at office   HPI [name] 34 y.o.female presents for follow up from the hospital. Admit date to the hospital was 03/31/20, patient was discharged from the hospital on 04/04/20, patient was admitted for: Covid symptoms chills fever fatigue congestion productive cough and shortness of breath and include GI upset nausea vomiting diarrhea.  She still remains short of breath with congestion but is staying isolated at this time and trying to drink fluids Past Medical History:  Diagnosis Date  . Abnormal Pap smear   . Bacterial vaginosis   . Gestational diabetes    likely pre-exisiting; all pregnancies glyburide started 11/4  . Gestational thrombocytopenia ([place])   . Headache(784.0)   . Hernia, umbilical   . History of abnormal cervical Pap smear    normal 2014  . Mild preeclampsia delivered 01/22/2016  . Type 2 diabetes mellitus (HCC)      Allergies  Allergen Reactions  . Latex Itching and Rash  . Penicillins Itching and Rash    Did it involve swelling of the face/tongue/throat, SOB, or low BP? N Did it involve sudden or severe rash/hives, skin peeling, or any reaction on the inside of your mouth or nose? Y Did you need to seek medical attention at a hospital or doctor's office? No When did it last happen?Several Years Ago If all above answers are "NO", may proceed with cephalosporin use.          Current Outpatient Medications on File Prior to Visit  Medication  Sig Dispense Refill  . acetaminophen (TYLENOL) 500 MG tablet Take 2 tablets (1,000 mg total) by mouth every 8 (eight) hours as needed for mild pain or headache. 30 tablet 0  . albuterol (VENTOLIN HFA) 108 (90 Base) MCG/ACT inhaler Inhale 1-2 puffs into the lungs every 4 (four) hours as needed for wheezing or shortness of breath. 8 g 0  . benzonatate (TESSALON) 100 MG capsule Take 1 capsule (100 mg total) by mouth every 8 (eight) hours. 21 capsule 0  . blood glucose meter kit and supplies KIT Dispense based on patient and insurance preference. Use up to four times daily as directed. (FOR ICD-9 250.00, 250.01). 1 each 0  . blood glucose meter kit and supplies KIT Dispense based on patient and insurance preference. Use up to four times daily as directed. (FOR ICD-9 250.00, 250.01). 1 each 0  . insulin aspart protamine - aspart (NOVOLOG 70/30 MIX) (70-30) 100 UNIT/ML FlexPen Inject 0.5 mLs (50 Units total) into the skin 2 (two) times daily. 15 mL 11  . Insulin Pen Needle 31G X 5 MM MISC Use insulin as directed 100 each 3  . metFORMIN (GLUCOPHAGE-XR) 500 MG 24 hr tablet Take 2 tablets (1,000 mg total) by mouth 2 (two) times daily. Gradually increase your metformin dose (see discharge instructions) 60 tablet 1  . nystatin (MYCOSTATIN/NYSTOP) powder Apply 1 application topically 3 (three) times daily. (Patient not taking: Reported on 03/28/2020) 15 g 0  . nystatin cream (MYCOSTATIN) Apply to affected area 2 times daily (  Patient not taking: Reported on 03/28/2020) 30 g 0  . nystatin-triamcinolone ointment (MYCOLOG) Apply 1 application topically 2 (two) times daily. (Patient not taking: Reported on 03/28/2020) 30 g 0  . Pseudoeph-Doxylamine-DM-APAP (NYQUIL PO) Take 30 mLs by mouth at bedtime as needed (cough).     Marland Kitchen tiZANidine (ZANAFLEX) 4 MG tablet Take 1-2 tablets (4-8 mg total) by mouth every 6 (six) hours as needed for muscle spasms. (Patient not taking: Reported on 03/28/2020) 21 tablet 0  . [DISCONTINUED]  medroxyPROGESTERone (PROVERA) 10 MG tablet Take 1 tablet (10 mg total) by mouth daily. 10 tablet 3   No current facility-administered medications on file prior to visit.   70/30- 50 units  ROS: all negative except above.  Lilliann was seen today for hospitalization follow-up.  Diagnoses and all orders for this visit:  Encounter to establish care Juluis Mire, NP-C will be your  (PCP) she is mastered prepared . She is skilled to diagnosed and treat illness. Also able to answer health concern as well as continuing care of varied medical conditions, not limited by cause, organ system, or diagnosis.   Hospital discharge follow-up Retrieved from hospital discharge on 04/04/2020. Recommendations for Outpatient Follow-up:  1. Follow outpatient CBC/CMP 2. Follow CDC quarantine guidelines (quarantine until 04/18/20) 3. Follow diabetes and blood sugars outpatient - adjust as needed - started on insulin Due to Covid restrictions patient was not seen in the office and had a telem visit therefore labs are placed for the future after quarantine.  Type 2 diabetes mellitus without complication, with long-term current use of insulin ([place]) Patient states she is taking 70/30-50 units twice daily  Goal of therapy: Less than 6.5 hemoglobin A1c. Monitor foods that are high in carbohydrates are the following rice, potatoes, breads, sugars, and pastas.  Reduction in the intake (eating) will assist in lowering your blood sugars.  COVID-19 Your test for COVID-19 was positive, meaning that you were infected with the novel coronavirus and could give the germ to others.  Please continue isolation at home, for at least 10 days since the start of your fever/cough/breathlessness and until you have had 3 consecutive days without fever (without taking a fever reducer) and with cough/breathlessness improving. Please continue good preventive care measures, including:  frequent hand-washing, avoid touching your face, cover  coughs/sneezes, stay out of crowds and keep a 6 foot distance from others.  Recheck or go to the nearest hospital ED tent for re-assessment if fever/cough/breathlessness return.     Kerin Perna, NP 11:57 AM

## 2020-04-21 ENCOUNTER — Other Ambulatory Visit: Payer: Self-pay

## 2020-04-21 ENCOUNTER — Ambulatory Visit: Payer: Self-pay | Attending: Family Medicine | Admitting: Pharmacist

## 2020-04-21 DIAGNOSIS — E1165 Type 2 diabetes mellitus with hyperglycemia: Secondary | ICD-10-CM

## 2020-04-21 DIAGNOSIS — Z794 Long term (current) use of insulin: Secondary | ICD-10-CM

## 2020-04-21 NOTE — Progress Notes (Signed)
Virtual Visit via Telephone Note Due to national recommendations of social distancing due to COVID 19, telehealth visit is felt to be most appropriate for this patient at this time. I discussed the limitations, risks, security and privacy concerns of performing an evaluation and management service by telephone and the availability of in person appointments. I also discussed with the patient that there may be a patient responsible charge related to this service. The patient expressed understanding and agreed to proceed.   I connected Monique Schmidt on 09/02/21at 3:30 PM EDTEDT by telephoneand verified that I am speaking with the correct person using two identifiers.  Consent I discussed the limitations, risks, security and privacy concerns of performing an evaluation and management service by telephone and the availability of in person appointments. I also discussed with the patient that there may be a patient responsible charge related to this service. The patient expressed understanding and agreed to proceed.  Location of Patient: Nurse, adult of Provider: [facility]   Persons participating in Telemedicine visit: Butch Penny, PharmD, CPP Pervis Hocking, PGY1 Pharmacy Resident   S:    PCP:  No chief complaint on file.  Patient in good spirits.  Presents for diabetes evaluation, education, and management.  Patient was referred and last seen by Primary Care Provider on 04/15/2020.    Patient reports Diabetes was diagnosed as gestational diabetes ~4 years ago. T2DM dx 01/2020.   Family/Social History:  - FHx: DM - Tobacco: never smoker   - Alcohol: none   Insurance coverage/medication affordability: Healthy Blue  [place].  Medication adherence reported.   Current diabetes medications include: metformin 500 mg XR; 1000 mg BID; Novolog 70/30 50 units BID  Patient denies hypoglycemic events.  Patient reported  dietary habits: - Breakfast: boiled eggs, sausage biscuit - Lunch: [place] sandwich (wheat bread), Lays chips - Dinner: fried or baked chicken, pasta - Drinks: diet cranberry, water  Patient-reported exercise habits:  - Chores/house work daily - Nothing outside of that    Patient denies nocturia (nighttime urination).  Patient denies neuropathy (nerve pain). Patient denies visual changes. Patient reports self foot exams.     O:  Physical Exam   ROS   Lab Results  Component Value Date   HGBA1C 11.6 (H) 03/20/2020   There were no vitals filed for this visit.  Lipid Panel     Component Value Date/Time   TRIG 103 03/31/2020 1820   Home fasting blood sugars: 80 - 110 (1 outlier of 242) 2 hour post-meal/random blood sugars: 120 - 202.  Clinical Atherosclerotic Cardiovascular Disease (ASCVD): No  The ASCVD Risk score Denman George DC Jr., et al., 2013) failed to calculate for the following reasons:   The 2013 ASCVD risk score is only valid for ages 44 to 37    A/P: Diabetes longstanding currently uncontrolled, however, home blood sugars are improving since recovering from Covid. Patient is able to verbalize appropriate hypoglycemia management plan. Medication adherence appears appropriate.   In the future, patient would benefit from weight loss offered with the use of a GLP-1 RA. I have instructed her to follow-up in person in 1 month for reassessment.  -Continued current regimen. -Extensively discussed pathophysiology of diabetes, recommended lifestyle interventions, dietary effects on blood sugar control -Counseled on s/sx of and management of hypoglycemia -Next A1C anticipated 06/2020.    Written patient instructions provided.  Total time counseling over the phone: 30 minutes.   Follow up Pharmacist Clinic Visit in 1 month.  Benard Halsted, PharmD, [place] [phone]

## 2020-05-23 ENCOUNTER — Other Ambulatory Visit: Payer: Self-pay

## 2020-05-23 ENCOUNTER — Ambulatory Visit (HOSPITAL_COMMUNITY)
Admission: EM | Admit: 2020-05-23 | Discharge: 2020-05-23 | Disposition: A | Discharge status: Home / Self Care | Payer: Medicaid Other | Attending: Family Medicine | Admitting: Family Medicine

## 2020-05-23 ENCOUNTER — Encounter (HOSPITAL_COMMUNITY): Payer: Self-pay | Admitting: *Deleted

## 2020-05-23 DIAGNOSIS — E1165 Type 2 diabetes mellitus with hyperglycemia: Secondary | ICD-10-CM

## 2020-05-23 DIAGNOSIS — Z3202 Encounter for pregnancy test, result negative: Secondary | ICD-10-CM

## 2020-05-23 DIAGNOSIS — M546 Pain in thoracic spine: Secondary | ICD-10-CM

## 2020-05-23 DIAGNOSIS — B3731 Acute candidiasis of vulva and vagina: Secondary | ICD-10-CM

## 2020-05-23 LAB — POCT URINALYSIS DIPSTICK, ED / UC
Bilirubin Urine: NEGATIVE
Glucose, UA: NEGATIVE mg/dL
Hgb urine dipstick: NEGATIVE
Leukocytes,Ua: NEGATIVE
Nitrite: NEGATIVE
Protein, ur: NEGATIVE mg/dL
Specific Gravity, Urine: 1.025 (ref 1.005–1.030)
Urobilinogen, UA: 0.2 mg/dL (ref 0.0–1.0)
pH: 6.5 (ref 5.0–8.0)

## 2020-05-23 LAB — POC URINE PREG, ED: Preg Test, Ur: NEGATIVE

## 2020-05-23 MED ORDER — TIZANIDINE HCL 4 MG PO TABS: 4.0000 mg | ORAL_TABLET | Freq: Four times a day (QID) | ORAL | 0 refills | Status: DC | PRN

## 2020-05-23 MED ORDER — NYSTATIN-TRIAMCINOLONE 100000-0.1 UNIT/GM-% EX OINT: 1.0000 "application " | TOPICAL_OINTMENT | Freq: Two times a day (BID) | CUTANEOUS | 0 refills | Status: DC

## 2020-05-23 NOTE — ED Triage Notes (Addendum)
Patient in with complaints of mid back pain x 2 weeks. Denies recent injury. Patient has not taken any medications at home. Patient requesting pregnancy test, no menstrual cycle sine August 2021.

## 2020-05-23 NOTE — Discharge Instructions (Signed)
Urine negative for pregnancy and infection Cream for yeast infection Zanaflex for muscle relaxant.  Follow up as needed for continued or worsening symptoms

## 2020-05-24 NOTE — ED Provider Notes (Signed)
[facility]    CSN: [id number] Arrival date & time: 05/23/20  1287      History   Chief Complaint Chief Complaint  Patient presents with  . Back Pain    HPI Monique Schmidt is a 34 y.o. female.   Patient is a 34 year old female with past medical history of BV, headache, hernia, diabetes, COVID-19.  She presents today with complaints of mid back pain that has been coming and going for the past 2 weeks.  Denies any recent injury, heavy lifting or falls.  Has not taken any medicine for the symptoms.  She is reporting that had similar back pain when she was pregnant.  She has not had a menstrual cycle since August.  Concern for possible pregnancy.  Would also like to be checked for urinary tract infection.  Denies any dysuria, hematuria urinary frequency. No numbness, tingling, weakness in extremities, loss of bowel or bladder function. She also believes she has a yeast infection.  She has had some mild vaginal irritation and itching.  Denies any concern for STDs at this time.     Past Medical History:  Diagnosis Date  . Abnormal Pap smear   . Bacterial vaginosis   . Gestational diabetes    likely pre-exisiting; all pregnancies glyburide started 11/4  . Gestational thrombocytopenia ([place])   . Headache(784.0)   . Hernia, umbilical   . History of abnormal cervical Pap smear    normal 2014  . Mild preeclampsia delivered 01/22/2016  . Type 2 diabetes mellitus [facility])     Patient Active Problem List   Diagnosis Date Noted  . Acute respiratory failure with hypoxia ([place])   . COVID-19 03/31/2020  . 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 History of gestational diabetes 06/26/2019  . 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 BMI 50.0-59.9, adult ([place]) 11/09/2013  . Moderate persistent asthma 08/07/2013  . Diabetes mellitus ([place]) 04/16/2011    Past Surgical History:  Procedure Laterality Date  . UMBILICAL HERNIA REPAIR N/A 11/22/2014    Procedure: LAPAROSCOPIC UMBILICAL HERNIA;  Surgeon: Ralene Ok, MD;  Location: [place];  Service: General;  Laterality: N/A;  . WISDOM TOOTH EXTRACTION      OB History    Gravida  6   Para  5   Term  5   Preterm      AB  1   Living  5     SAB  1   TAB      Ectopic      Multiple  0   Live Births  5            Home Medications    Prior to Admission medications   Medication Sig Start Date End Date Taking? Authorizing Provider  acetaminophen (TYLENOL) 500 MG tablet Take 2 tablets (1,000 mg total) by mouth every 8 (eight) hours as needed for mild pain or headache. 04/04/20  Yes Elodia Florence., MD  albuterol (VENTOLIN HFA) 108 (90 Base) MCG/ACT inhaler Inhale 1-2 puffs into the lungs every 4 (four) hours as needed for wheezing or shortness of breath. 04/04/20  Yes Elodia Florence., MD  blood glucose meter kit and supplies KIT Dispense based on patient and insurance preference. Use up to four times daily as directed. (FOR ICD-9 250.00, 250.01). 03/20/20  Yes Darr, Marguerita Beards, PA-C  blood glucose meter kit and supplies KIT Dispense based on patient and insurance preference. Use up to four times daily as  directed. (FOR ICD-9 250.00, 250.01). 04/04/20  Yes Elodia Florence., MD  insulin aspart protamine - aspart (NOVOLOG 70/30 MIX) (70-30) 100 UNIT/ML FlexPen Inject 0.5 mLs (50 Units total) into the skin 2 (two) times daily. 04/04/20  Yes Elodia Florence., MD  metFORMIN (GLUCOPHAGE-XR) 500 MG 24 hr tablet Take 2 tablets (1,000 mg total) by mouth 2 (two) times daily. Gradually increase your metformin dose (see discharge instructions) 04/04/20  Yes Elodia Florence., MD  Insulin Pen Needle 31G X 5 MM MISC Use insulin as directed 04/04/20   Elodia Florence., MD  nystatin (MYCOSTATIN/NYSTOP) powder Apply 1 application topically 3 (three) times daily. 11/23/19   Raylene Everts, MD  nystatin cream (MYCOSTATIN) Apply to affected area 2 times daily Patient  not taking: Reported on 03/28/2020 11/23/19   Raylene Everts, MD  nystatin-triamcinolone ointment [facility]) Apply 1 application topically 2 (two) times daily. 05/23/20   Alanah Sakuma, Tressia Miners A, NP  Pseudoeph-Doxylamine-DM-APAP (NYQUIL PO) Take 30 mLs by mouth at bedtime as needed (cough).     [provider]  tiZANidine (ZANAFLEX) 4 MG tablet Take 1-2 tablets (4-8 mg total) by mouth every 6 (six) hours as needed for muscle spasms. 05/23/20   Loura Halt A, NP  medroxyPROGESTERone (PROVERA) 10 MG tablet Take 1 tablet (10 mg total) by mouth daily. 06/24/19 08/18/19  Donnamae Jude, MD    Family History Family History  Problem Relation Age of Onset  . Hypertension Father   . Diabetes Father   . Diabetes Mother   . Seizures Brother     Social History Social History   Tobacco Use  . Smoking status: Never Smoker  . Smokeless tobacco: Never Used  Vaping Use  . Vaping Use: Never used  Substance Use Topics  . Alcohol use: No  . Drug use: No     Allergies   Latex and Penicillins   Review of Systems Review of Systems   Physical Exam Triage Vital Signs ED Triage Vitals  Enc Vitals Group     BP 05/23/20 1030 120/79     Pulse Rate 05/23/20 1030 75     Resp 05/23/20 1030 16     Temp 05/23/20 1030 98.1 F (36.7 C)     Temp Source 05/23/20 1030 Oral     SpO2 05/23/20 1030 99 %     Weight --      Height --      Head Circumference --      Peak Flow --      Pain Score 05/23/20 1032 9     Pain Loc --      Pain Edu? --      Excl. in [place]? --    No data found.  Updated Vital Signs BP 120/79 (BP Location: Right Arm)   Pulse 75   Temp 98.1 F (36.7 C) (Oral)   Resp 16   SpO2 99%   Visual Acuity Right Eye Distance:   Left Eye Distance:   Bilateral Distance:    Right Eye Near:   Left Eye Near:    Bilateral Near:     Physical Exam Vitals and nursing note reviewed.  Constitutional:      General: She is not in acute distress.    Appearance: Normal appearance. She is not  ill-appearing, toxic-appearing or diaphoretic.  HENT:     Head: Normocephalic.     Nose: Nose normal.  Eyes:     Conjunctiva/sclera: Conjunctivae normal.  Pulmonary:     Effort: Pulmonary effort is normal.  Musculoskeletal:        General: Normal range of motion.     Cervical back: Normal range of motion.     Lumbar back: Negative right straight leg raise test and negative left straight leg raise test.       Back:  Skin:    General: Skin is warm and dry.     Findings: No rash.  Neurological:     Mental Status: She is alert.  Psychiatric:        Mood and Affect: Mood normal.      UC Treatments / Results  Labs (all labs ordered are listed, but only abnormal results are displayed) Labs Reviewed  POCT URINALYSIS DIPSTICK, ED / UC - Abnormal; Notable for the following components:      Result Value   Ketones, ur TRACE (*)    All other components within normal limits  POC URINE PREG, ED    EKG   Radiology No results found.  Procedures Procedures (including critical care time)  Medications Ordered in UC Medications - No data to display  Initial Impression / Assessment and Plan / UC Course  I have reviewed the triage vital signs and the nursing notes.  Pertinent labs & imaging results that were available during my care of the patient were reviewed by me and considered in my medical decision making (see chart for details).     Acute midline back pain Nothing concerning on exam, most likely muscle strain.  Zanaflex for muscle relaxant as needed.   Concern for pregnancy.  Urine without pregnancy and infection.  Cream sent for possible yeast infection.   Follow up as needed for continued or worsening symptoms   Final Clinical Impressions(s) / UC Diagnoses   Final diagnoses:  Acute midline thoracic back pain  Negative pregnancy test     Discharge Instructions     Urine negative for pregnancy and infection Cream for yeast infection Zanaflex for muscle  relaxant.  Follow up as needed for continued or worsening symptoms     ED Prescriptions    Medication Sig Dispense Auth. Provider   tiZANidine (ZANAFLEX) 4 MG tablet Take 1-2 tablets (4-8 mg total) by mouth every 6 (six) hours as needed for muscle spasms. 21 tablet Sinclair Arrazola A, NP   nystatin-triamcinolone ointment (MYCOLOG) Apply 1 application topically 2 (two) times daily. 30 g Loura Halt A, NP     PDMP not reviewed this encounter.   Orvan July, NP 05/24/20 1035

## 2020-05-26 ENCOUNTER — Encounter: Payer: Self-pay | Admitting: Pharmacist

## 2020-05-26 ENCOUNTER — Other Ambulatory Visit: Payer: Self-pay

## 2020-05-26 ENCOUNTER — Ambulatory Visit: Payer: Medicaid Other | Attending: Primary Care | Admitting: Pharmacist

## 2020-05-26 DIAGNOSIS — E1165 Type 2 diabetes mellitus with hyperglycemia: Secondary | ICD-10-CM

## 2020-05-26 MED ORDER — INSULIN PEN NEEDLE 31G X 5 MM MISC: 3 refills | Status: DC

## 2020-05-26 MED ORDER — METFORMIN HCL ER 500 MG PO TB24: 1000.0000 mg | ORAL_TABLET | Freq: Two times a day (BID) | ORAL | 1 refills | Status: DC

## 2020-05-26 MED ORDER — ACCU-CHEK GUIDE VI STRP: ORAL_STRIP | 6 refills | Status: DC

## 2020-05-26 NOTE — Progress Notes (Signed)
   S:    PCP:  No chief complaint on file.  Patient in good spirits.  Presents for diabetes evaluation, education, and management.  Patient was referred and last seen by Primary Care Provider on 04/15/2020.    Patient reports Diabetes was diagnosed as gestational diabetes ~10-11 years ago.  Family/Social History:  - FHx: DM - Tobacco: never smoker   - Alcohol: none   Insurance coverage/medication affordability: [place] Blue  [place].  Medication adherence denied.   Current diabetes medications include: metformin 1000 mg XR BID (only taking one 500mg  tablet daily); Novolog 70/30 50 units BID  Patient denies hypoglycemic events.  Patient reported dietary habits: - Breakfast: boiled eggs, sausage biscuit - Lunch: sandwich (wheat bread), Lays chips - Dinner: fried or baked chicken, pasta - Drinks: diet cranberry, water  Patient-reported exercise habits:  - Chores/house work daily - Nothing outside of that    Patient denies nocturia (nighttime urination).  Patient denies neuropathy (nerve pain). Patient denies visual changes. Patient reports self foot exams.     O:  Physical Exam   ROS   Lab Results  Component Value Date   HGBA1C 11.6 (H) 03/20/2020   There were no vitals filed for this visit.  Lipid Panel     Component Value Date/Time   TRIG 103 03/31/2020 1820   Home fasting blood sugars: 90-115 2 hour post-meal/random blood sugars: 90 - 200   Clinical Atherosclerotic Cardiovascular Disease (ASCVD): No  The ASCVD Risk score 05/31/2020 DC Jr., et al., 2013) failed to calculate for the following reasons:   The 2013 ASCVD risk score is only valid for ages 87 to 50   A/P: Diabetes longstanding currently uncontrolled. Patient is able to verbalize appropriate hypoglycemia management plan. Medication adherence: pt is only taking 500 mg daily of metformin. I have instructed her to increase to 1000 mg XR BID.  -Continued current regimen. -Counseled to take  metformin XR 1000 mg BID as prescribed. Pt to take two 500 mg XR tablets BID.  -Continue current insulin rx.  -Extensively discussed pathophysiology of diabetes, recommended lifestyle interventions, dietary effects on blood sugar control -Counseled on s/sx of and management of hypoglycemia -Next A1C anticipated 06/2020.    Written patient instructions provided.  Total time counseling over the phone: 30 minutes.   Follow up w/ PCP in November.   December, PharmD, CPP Clinical Pharmacist [facility] [phone]

## 2020-07-18 ENCOUNTER — Ambulatory Visit (INDEPENDENT_AMBULATORY_CARE_PROVIDER_SITE_OTHER): Payer: Medicaid Other | Admitting: Primary Care

## 2020-09-27 ENCOUNTER — Telehealth: Payer: Self-pay | Admitting: Family Medicine

## 2020-09-27 NOTE — Telephone Encounter (Signed)
Pt states she needs a call today, pt states that she is having severe abd pain last night for 3 hours and still hurting. Pt states she has not had cycle for 2 months but unaware if she is preg.

## 2020-09-27 NOTE — Telephone Encounter (Signed)
Called pt and pt informed me that she had laid in the bed for 3 hours with abd pain that started on Thursday and went from the left side to right side.  Pt reports that she has not had a period and has not taken a pregnancy test.  I asked pt could take a pregnancy test so that we find out how to manage her properly. Pt stated that she would be able to take an at home pregnancy test and send the results through MyChart.  I advised pt that she can take Tylenol for the pain as she reported not taking anything for pain.  Pt agreed and did not have another questions.   Leonette Nutting  09/27/20

## 2020-12-10 ENCOUNTER — Ambulatory Visit (INDEPENDENT_AMBULATORY_CARE_PROVIDER_SITE_OTHER): Payer: Medicaid Other

## 2020-12-10 ENCOUNTER — Other Ambulatory Visit: Payer: Self-pay

## 2020-12-10 ENCOUNTER — Ambulatory Visit (HOSPITAL_COMMUNITY)
Admission: EM | Admit: 2020-12-10 | Discharge: 2020-12-10 | Disposition: A | Discharge status: Home / Self Care | Payer: Medicaid Other | Attending: Internal Medicine | Admitting: Internal Medicine

## 2020-12-10 ENCOUNTER — Encounter (HOSPITAL_COMMUNITY): Payer: Self-pay | Admitting: Emergency Medicine

## 2020-12-10 DIAGNOSIS — R109 Unspecified abdominal pain: Secondary | ICD-10-CM

## 2020-12-10 DIAGNOSIS — H66001 Acute suppurative otitis media without spontaneous rupture of ear drum, right ear: Secondary | ICD-10-CM | POA: Diagnosis present

## 2020-12-10 DIAGNOSIS — M545 Low back pain, unspecified: Secondary | ICD-10-CM

## 2020-12-10 DIAGNOSIS — Z3202 Encounter for pregnancy test, result negative: Secondary | ICD-10-CM

## 2020-12-10 DIAGNOSIS — H9201 Otalgia, right ear: Secondary | ICD-10-CM | POA: Diagnosis not present

## 2020-12-10 DIAGNOSIS — N898 Other specified noninflammatory disorders of vagina: Secondary | ICD-10-CM | POA: Diagnosis present

## 2020-12-10 DIAGNOSIS — N926 Irregular menstruation, unspecified: Secondary | ICD-10-CM | POA: Diagnosis present

## 2020-12-10 LAB — POCT URINALYSIS DIPSTICK, ED / UC
Bilirubin Urine: NEGATIVE
Glucose, UA: >=1000 mg/dL — AB
Hgb urine dipstick: NEGATIVE
Ketones, ur: 15 mg/dL — AB
Leukocytes,Ua: NEGATIVE
Nitrite: NEGATIVE
Protein, ur: NEGATIVE mg/dL
Specific Gravity, Urine: 1.025 (ref 1.005–1.030)
Urobilinogen, UA: 0.2 mg/dL (ref 0.0–1.0)
pH: 5.5 (ref 5.0–8.0)

## 2020-12-10 LAB — CBG MONITORING, ED: Glucose-Capillary: 258 mg/dL — ABNORMAL HIGH (ref 70–99)

## 2020-12-10 LAB — POC URINE PREG, ED: Preg Test, Ur: NEGATIVE

## 2020-12-10 MED ORDER — AZITHROMYCIN 250 MG PO TABS: 250.0000 mg | ORAL_TABLET | Freq: Every day | ORAL | 0 refills | Status: DC

## 2020-12-10 MED ORDER — TIZANIDINE HCL 4 MG PO TABS: 4.0000 mg | ORAL_TABLET | Freq: Four times a day (QID) | ORAL | 0 refills | Status: DC | PRN

## 2020-12-10 NOTE — ED Triage Notes (Addendum)
Pt presents today with c/o of back (since 2016), abdomen (LMP possibly in February) and ear pain x 2 days. She thinks she may be pregnant. Denies n/v. +diarrhea. She also reports vaginal discharge.

## 2020-12-10 NOTE — Discharge Instructions (Addendum)
Take azithromycin for ear infection.  I recommend over-the-counter Flonase and antihistamines to help with symptoms.  You can alternate Tylenol and ibuprofen for pain relief.  Follow-up with PCP to ensure clearing of infection.  Your urine pregnancy test was negative.  Your urine did not show any evidence of infection but did show a lot of glucose and your blood sugar was high.  It is very important that you go home and take your diabetes medication.  Drink lots of fluid and avoid carbohydrates.  Monitor your blood sugar closely and if you develop any nausea/vomiting or shortness of breath you need to be reevaluated immediately.  Your x-ray was normal.  I refilled Zanaflex to help with muscle pain.  You can alternate Tylenol and ibuprofen for additional pain relief.  I will be in touch with your other results soon as we have them and will determine treatment based on your testing.

## 2020-12-10 NOTE — ED Provider Notes (Signed)
[place]    CSN: [id number] Arrival date & time: 12/10/20  1412      History   Chief Complaint Chief Complaint  Patient presents with  . Back Pain  . Otalgia  . Abdominal Pain    HPI Monique Schmidt is a 35 y.o. female.   Patient presents today with a variety of complaints.  She reports several year history (since 2016) of lower back pain that has worsened in the past several weeks.  She denies any known injury or increase in activity prior to symptom onset.  She has tried over-the-counter analgesics without improvement of symptoms.  She reports pain is rated 8 on a 0-10 pain scale, localized to midline lumbar back with radiation to right, described as aching periodic sharp pains, worse with changing positions, no alleviating factors identified.  She denies history of malignancy or previous spinal surgery.  She denies any bowel/bladder incontinence, lower extremity weakness, saddle anesthesia.  In addition, patient reports some intermittent abdominal pain similar to previous pregnancies.  Her last menstrual cycle was February 2022.  She has not had an at home pregnancy test.  Denies any nausea or vomiting.   In addition, patient reports vaginal discharge.  She denies any recent antibiotic use or changes to personal hygiene products.  Believes she may have a yeast infection.  She has not tried over-the-counter medications.  She is interested in testing today.  She denies any pelvic pain, nausea, vomiting, fever.  In addition, patient reports a 2-day history of bilateral otalgia (right greater than left).  She reports some allergy symptoms including nasal congestion and sore throat but states these are mild.  Denies any known sick contacts or recent illness.  She denies any recent antibiotic use.  Denies any recent swimming.  She has not tried any over-the-counter medications for symptom management.     Past Medical History:  Diagnosis Date  . Abnormal Pap smear   .  Bacterial vaginosis   . Gestational diabetes    likely pre-exisiting; all pregnancies glyburide started 11/4  . Gestational thrombocytopenia ([place])   . Headache(784.0)   . Hernia, umbilical   . History of abnormal cervical Pap smear    normal 2014  . Mild preeclampsia delivered 01/22/2016  . Type 2 diabetes mellitus [facility])     Patient Active Problem List   Diagnosis Date Noted  . Acute respiratory failure with hypoxia ([place])   . COVID-19 03/31/2020  . 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 History of gestational diabetes 06/26/2019  . 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 BMI 50.0-59.9, adult ([place]) 11/09/2013  . Moderate persistent asthma 08/07/2013  . Diabetes mellitus ([place]) 04/16/2011    Past Surgical History:  Procedure Laterality Date  . UMBILICAL HERNIA REPAIR N/A 11/22/2014   Procedure: LAPAROSCOPIC UMBILICAL HERNIA;  Surgeon: Ralene Ok, MD;  Location: [facility];  Service: General;  Laterality: N/A;  . WISDOM TOOTH EXTRACTION      OB History    Gravida  6   Para  5   Term  5   Preterm      AB  1   Living  5     SAB  1   IAB      Ectopic      Multiple  0   Live Births  5            Home Medications    Prior to Admission medications   Medication Sig Start Date  End Date Taking? Authorizing Provider  azithromycin (ZITHROMAX) 250 MG tablet Take 1 tablet (250 mg total) by mouth daily. Take first 2 tablets together, then 1 every day until finished. 12/10/20  Yes Algie Cales, Derry Skill, PA-C  acetaminophen (TYLENOL) 500 MG tablet Take 2 tablets (1,000 mg total) by mouth every 8 (eight) hours as needed for mild pain or headache. 04/04/20   Elodia Florence., MD  albuterol (VENTOLIN HFA) 108 (90 Base) MCG/ACT inhaler Inhale 1-2 puffs into the lungs every 4 (four) hours as needed for wheezing or shortness of breath. 04/04/20   Elodia Florence., MD  blood glucose meter kit and supplies KIT Dispense based on patient and insurance  preference. Use up to four times daily as directed. (FOR ICD-9 250.00, 250.01). 03/20/20   Darr, Edison Nasuti, PA-C  blood glucose meter kit and supplies KIT Dispense based on patient and insurance preference. Use up to four times daily as directed. (FOR ICD-9 250.00, 250.01). 04/04/20   Elodia Florence., MD  glucose blood (ACCU-CHEK GUIDE) test strip Use to check blood sugar TID. E11.65 05/26/20   Charlott Rakes, MD  insulin aspart protamine - aspart (NOVOLOG 70/30 MIX) (70-30) 100 UNIT/ML FlexPen Inject 0.5 mLs (50 Units total) into the skin 2 (two) times daily. 04/04/20   Elodia Florence., MD  Insulin Pen Needle 31G X 5 MM MISC Use to inject insulin twice daily E11.65 05/26/20   Charlott Rakes, MD  metFORMIN (GLUCOPHAGE-XR) 500 MG 24 hr tablet Take 2 tablets (1,000 mg total) by mouth 2 (two) times daily. 05/26/20   Charlott Rakes, MD  nystatin (MYCOSTATIN/NYSTOP) powder Apply 1 application topically 3 (three) times daily. 11/23/19   Raylene Everts, MD  nystatin cream (MYCOSTATIN) Apply to affected area 2 times daily Patient not taking: Reported on 03/28/2020 11/23/19   Raylene Everts, MD  nystatin-triamcinolone ointment [facility]) Apply 1 application topically 2 (two) times daily. 05/23/20   Bast, Tressia Miners A, NP  Pseudoeph-Doxylamine-DM-APAP (NYQUIL PO) Take 30 mLs by mouth at bedtime as needed (cough).     [provider]  tiZANidine (ZANAFLEX) 4 MG tablet Take 1 tablet (4 mg total) by mouth every 6 (six) hours as needed for muscle spasms. 12/10/20   Payton Prinsen, Derry Skill, PA-C  medroxyPROGESTERone (PROVERA) 10 MG tablet Take 1 tablet (10 mg total) by mouth daily. 06/24/19 08/18/19  Donnamae Jude, MD    Family History Family History  Problem Relation Age of Onset  . Hypertension Father   . Diabetes Father   . Diabetes Mother   . Seizures Brother     Social History Social History   Tobacco Use  . Smoking status: Never Smoker  . Smokeless tobacco: Never Used  Vaping Use  . Vaping Use:  Never used  Substance Use Topics  . Alcohol use: No  . Drug use: No     Allergies   Latex and Penicillins   Review of Systems Review of Systems  Constitutional: Negative for activity change, appetite change, fatigue and fever.  HENT: Positive for ear pain. Negative for congestion, sinus pressure, sneezing and sore throat.   Respiratory: Negative for cough and shortness of breath.   Cardiovascular: Negative for chest pain.  Gastrointestinal: Negative for abdominal pain, diarrhea, nausea and vomiting.  Genitourinary: Positive for menstrual problem and vaginal discharge. Negative for vaginal bleeding and vaginal pain.  Musculoskeletal: Positive for back pain. Negative for arthralgias and myalgias.  Neurological: Negative for dizziness, light-headedness and headaches.  Physical Exam Triage Vital Signs ED Triage Vitals  Enc Vitals Group     BP 12/10/20 1440 138/87     Pulse Rate 12/10/20 1440 82     Resp 12/10/20 1440 20     Temp 12/10/20 1440 98.5 F (36.9 C)     Temp Source 12/10/20 1440 Oral     SpO2 12/10/20 1440 95 %     Weight --      Height --      Head Circumference --      Peak Flow --      Pain Score 12/10/20 1437 8     Pain Loc --      Pain Edu? --      Excl. in [place]? --    No data found.  Updated Vital Signs BP 138/87 (BP Location: Right Arm)   Pulse 82   Temp 98.5 F (36.9 C) (Oral)   Resp 20   LMP  (LMP Unknown)   SpO2 95%   Visual Acuity Right Eye Distance:   Left Eye Distance:   Bilateral Distance:    Right Eye Near:   Left Eye Near:    Bilateral Near:     Physical Exam Vitals reviewed.  Constitutional:      General: She is awake. She is not in acute distress.    Appearance: Normal appearance. She is not ill-appearing.     Comments: Very pleasant female appears stated age no acute distress  HENT:     Head: Normocephalic and atraumatic.     Right Ear: Ear canal and external ear normal. Tympanic membrane is erythematous and bulging.      Left Ear: Tympanic membrane, ear canal and external ear normal. Tympanic membrane is not erythematous or bulging.     Nose:     Right Sinus: No maxillary sinus tenderness or frontal sinus tenderness.     Left Sinus: No maxillary sinus tenderness or frontal sinus tenderness.     Mouth/Throat:     Pharynx: Uvula midline. No oropharyngeal exudate or posterior oropharyngeal erythema.     Comments: Drainage present posterior oropharynx Cardiovascular:     Rate and Rhythm: Normal rate and regular rhythm.     Heart sounds: No murmur heard.   Pulmonary:     Effort: Pulmonary effort is normal.     Breath sounds: Normal breath sounds. No wheezing, rhonchi or rales.     Comments: Clear to auscultation bilaterally Abdominal:     Palpations: Abdomen is soft.     Tenderness: There is no abdominal tenderness.  Musculoskeletal:     Cervical back: No tenderness or bony tenderness.     Thoracic back: No tenderness or bony tenderness.     Lumbar back: Tenderness and bony tenderness present. Negative right straight leg raise test and negative left straight leg raise test.     Comments: Pain percussion of L3-L5.  Tenderness to palpation of right paraspinal muscles.  Lymphadenopathy:     Head:     Right side of head: No submental, submandibular or tonsillar adenopathy.     Left side of head: No submental, submandibular or tonsillar adenopathy.     Cervical: No cervical adenopathy.  Psychiatric:        Behavior: Behavior is cooperative.      UC Treatments / Results  Labs (all labs ordered are listed, but only abnormal results are displayed) Labs Reviewed  POCT URINALYSIS DIPSTICK, ED / UC - Abnormal; Notable for the following components:  Result Value   Glucose, UA >=1000 (*)    Ketones, ur 15 (*)    All other components within normal limits  CBG MONITORING, ED - Abnormal; Notable for the following components:   Glucose-Capillary 258 (*)    All other components within normal limits  POC  URINE PREG, ED  CERVICOVAGINAL ANCILLARY ONLY    EKG   Radiology DG Lumbar Spine Complete  Result Date: 12/10/2020 CLINICAL DATA:  Pain with percussion of lumbar vertebra. Back pain since 2016. EXAM: LUMBAR SPINE - COMPLETE 4+ VIEW COMPARISON:  None. FINDINGS: There is no evidence of lumbar spine fracture. Alignment is normal. Intervertebral disc spaces are maintained. IMPRESSION: Negative. Electronically Signed   By: Dorise Bullion III M.D   On: 12/10/2020 15:52    Procedures Procedures (including critical care time)  Medications Ordered in UC Medications - No data to display  Initial Impression / Assessment and Plan / UC Course  I have reviewed the triage vital signs and the nursing notes.  Pertinent labs & imaging results that were available during my care of the patient were reviewed by me and considered in my medical decision making (see chart for details).     Otitis media identified on physical exam.  Patient treated with azithromycin given penicillin allergy.  She was encouraged use over-the-counter medications for additional symptom relief.  Recommend she follow-up with PCP to ensure clearing of infection.  Strict return precautions given to which patient expressed understanding.   Urine pregnancy test was negative in office today.  UA showed glucosuria with trace ketones but was otherwise normal.  Finger glucose was 258.  Patient was instructed to drink plenty of fluid and avoid carbohydrates.  Recommended she monitor her blood sugar closely at home and keep a log for evaluation with PCP.  Requested she follow-up with PCP within 1 week.  Discussed that if she has any worsening symptoms including nausea, vomiting, shortness of breath she is to go to the emergency room to which she expressed understanding.  Strict return precautions given.  X-ray of lumbar back obtained given bony tenderness to palpation.  X-ray was normal.  Patient provided refill of Zanaflex to be used as  needed with instruction not to drive or drink alcohol with this medication.  She can alternate over-the-counter analgesics for additional pain relief.  Recommended heat, rest, stretch for additional symptom relief.  She is to follow-up with PCP for further evaluation and management.  Will defer treatment of vaginal discharge until STI results are obtained.  35 minutes spent in direct patient care and coordination.  Final Clinical Impressions(s) / UC Diagnoses   Final diagnoses:  Non-recurrent acute suppurative otitis media of right ear without spontaneous rupture of tympanic membrane  Lumbar back pain  Missed menses  Vaginal discharge     Discharge Instructions     Take azithromycin for ear infection.  I recommend over-the-counter Flonase and antihistamines to help with symptoms.  You can alternate Tylenol and ibuprofen for pain relief.  Follow-up with PCP to ensure clearing of infection.  Your urine pregnancy test was negative.  Your urine did not show any evidence of infection but did show a lot of glucose and your blood sugar was high.  It is very important that you go home and take your diabetes medication.  Drink lots of fluid and avoid carbohydrates.  Monitor your blood sugar closely and if you develop any nausea/vomiting or shortness of breath you need to be reevaluated immediately.  Your x-ray  was normal.  I refilled Zanaflex to help with muscle pain.  You can alternate Tylenol and ibuprofen for additional pain relief.  I will be in touch with your other results soon as we have them and will determine treatment based on your testing.      ED Prescriptions    Medication Sig Dispense Auth. Provider   azithromycin (ZITHROMAX) 250 MG tablet Take 1 tablet (250 mg total) by mouth daily. Take first 2 tablets together, then 1 every day until finished. 6 tablet Monique Belizaire K, PA-C   tiZANidine (ZANAFLEX) 4 MG tablet Take 1 tablet (4 mg total) by mouth every 6 (six) hours as needed  for muscle spasms. 21 tablet Monique Schmidt, Derry Skill, PA-C     PDMP not reviewed this encounter.   Terrilee Croak, PA-C 12/10/20 1606

## 2020-12-12 LAB — CERVICOVAGINAL ANCILLARY ONLY
Bacterial Vaginitis (gardnerella): POSITIVE — AB
Candida Glabrata: NEGATIVE
Candida Vaginitis: NEGATIVE
Chlamydia: NEGATIVE
Comment: NEGATIVE
Comment: NEGATIVE
Comment: NEGATIVE
Comment: NEGATIVE
Comment: NEGATIVE
Comment: NORMAL
Neisseria Gonorrhea: NEGATIVE
Trichomonas: NEGATIVE

## 2020-12-14 ENCOUNTER — Telehealth (HOSPITAL_COMMUNITY): Payer: Self-pay | Admitting: Emergency Medicine

## 2020-12-14 MED ORDER — METRONIDAZOLE 500 MG PO TABS
500.0000 mg | ORAL_TABLET | Freq: Two times a day (BID) | ORAL | 0 refills | Status: DC
Start: 2020-12-14 — End: 2021-04-03

## 2020-12-19 ENCOUNTER — Encounter: Payer: Self-pay | Admitting: *Deleted

## 2021-01-02 DIAGNOSIS — F1299 Cannabis use, unspecified with unspecified cannabis-induced disorder: Secondary | ICD-10-CM | POA: Diagnosis not present

## 2021-01-04 DIAGNOSIS — F1299 Cannabis use, unspecified with unspecified cannabis-induced disorder: Secondary | ICD-10-CM | POA: Diagnosis not present

## 2021-01-06 DIAGNOSIS — M545 Low back pain, unspecified: Secondary | ICD-10-CM | POA: Diagnosis not present

## 2021-01-06 DIAGNOSIS — G8929 Other chronic pain: Secondary | ICD-10-CM | POA: Diagnosis not present

## 2021-01-06 DIAGNOSIS — M546 Pain in thoracic spine: Secondary | ICD-10-CM | POA: Diagnosis not present

## 2021-01-09 DIAGNOSIS — F1299 Cannabis use, unspecified with unspecified cannabis-induced disorder: Secondary | ICD-10-CM | POA: Diagnosis not present

## 2021-01-11 DIAGNOSIS — F1299 Cannabis use, unspecified with unspecified cannabis-induced disorder: Secondary | ICD-10-CM | POA: Diagnosis not present

## 2021-01-16 DIAGNOSIS — F1299 Cannabis use, unspecified with unspecified cannabis-induced disorder: Secondary | ICD-10-CM | POA: Diagnosis not present

## 2021-01-18 DIAGNOSIS — F1299 Cannabis use, unspecified with unspecified cannabis-induced disorder: Secondary | ICD-10-CM | POA: Diagnosis not present

## 2021-01-23 DIAGNOSIS — F1299 Cannabis use, unspecified with unspecified cannabis-induced disorder: Secondary | ICD-10-CM | POA: Diagnosis not present

## 2021-01-25 DIAGNOSIS — F1299 Cannabis use, unspecified with unspecified cannabis-induced disorder: Secondary | ICD-10-CM | POA: Diagnosis not present

## 2021-01-30 DIAGNOSIS — F1299 Cannabis use, unspecified with unspecified cannabis-induced disorder: Secondary | ICD-10-CM | POA: Diagnosis not present

## 2021-02-01 DIAGNOSIS — F1299 Cannabis use, unspecified with unspecified cannabis-induced disorder: Secondary | ICD-10-CM | POA: Diagnosis not present

## 2021-02-03 DIAGNOSIS — M546 Pain in thoracic spine: Secondary | ICD-10-CM | POA: Diagnosis not present

## 2021-02-03 DIAGNOSIS — M545 Low back pain, unspecified: Secondary | ICD-10-CM | POA: Diagnosis not present

## 2021-02-03 DIAGNOSIS — G8929 Other chronic pain: Secondary | ICD-10-CM | POA: Diagnosis not present

## 2021-02-06 DIAGNOSIS — F1299 Cannabis use, unspecified with unspecified cannabis-induced disorder: Secondary | ICD-10-CM | POA: Diagnosis not present

## 2021-02-08 DIAGNOSIS — F1299 Cannabis use, unspecified with unspecified cannabis-induced disorder: Secondary | ICD-10-CM | POA: Diagnosis not present

## 2021-02-13 DIAGNOSIS — F1299 Cannabis use, unspecified with unspecified cannabis-induced disorder: Secondary | ICD-10-CM | POA: Diagnosis not present

## 2021-02-14 ENCOUNTER — Other Ambulatory Visit: Payer: Self-pay

## 2021-02-14 ENCOUNTER — Ambulatory Visit (HOSPITAL_COMMUNITY)
Admission: EM | Admit: 2021-02-14 | Discharge: 2021-02-14 | Disposition: A | Discharge status: Home / Self Care | Payer: Medicaid Other | Attending: Physician Assistant | Admitting: Physician Assistant

## 2021-02-14 ENCOUNTER — Encounter (HOSPITAL_COMMUNITY): Payer: Self-pay | Admitting: Emergency Medicine

## 2021-02-14 DIAGNOSIS — R3915 Urgency of urination: Secondary | ICD-10-CM | POA: Diagnosis not present

## 2021-02-14 DIAGNOSIS — N898 Other specified noninflammatory disorders of vagina: Secondary | ICD-10-CM | POA: Insufficient documentation

## 2021-02-14 DIAGNOSIS — N926 Irregular menstruation, unspecified: Secondary | ICD-10-CM | POA: Insufficient documentation

## 2021-02-14 DIAGNOSIS — M546 Pain in thoracic spine: Secondary | ICD-10-CM | POA: Diagnosis not present

## 2021-02-14 DIAGNOSIS — R350 Frequency of micturition: Secondary | ICD-10-CM | POA: Insufficient documentation

## 2021-02-14 LAB — POCT URINALYSIS DIPSTICK, ED / UC
Bilirubin Urine: NEGATIVE
Glucose, UA: >=1000 mg/dL — AB
Leukocytes,Ua: NEGATIVE
Nitrite: NEGATIVE
Protein, ur: NEGATIVE mg/dL
Specific Gravity, Urine: 1.02 (ref 1.005–1.030)
Urobilinogen, UA: 0.2 mg/dL (ref 0.0–1.0)
pH: 5 (ref 5.0–8.0)

## 2021-02-14 LAB — POC URINE PREG, ED: Preg Test, Ur: NEGATIVE

## 2021-02-14 MED ORDER — NYSTATIN 100000 UNIT/GM EX CREA: TOPICAL_CREAM | CUTANEOUS | 0 refills | Status: DC

## 2021-02-14 MED ORDER — FLUCONAZOLE 150 MG PO TABS: 150.0000 mg | ORAL_TABLET | ORAL | 0 refills | Status: DC | PRN

## 2021-02-14 NOTE — ED Triage Notes (Signed)
Pt is present today with lower back pain and vaginal discharge. Pt states that her sx started four days ago.

## 2021-02-14 NOTE — Discharge Instructions (Addendum)
Your urine did not show any evidence of infection but did have a significant amount of glucose which I believe is contributing to your symptoms.  This is also one of the reasons you are likely having vaginal irritation and yeast infections.  Please follow-up with your primary care provider for management of diabetes and make sure that you are checking blood sugars regularly.  I have sent your urine off for culture and if we need to start antibiotic we will contact you.  We are going to empirically treat you for a yeast infection with Diflucan.  Take 1 tablet every 3 days for total of 2 tablets.  You can use over-the-counter clotrimazole for additional symptom relief if necessary.  If you have any worsening symptoms including pelvic pain, abdominal pain, fever you need to return for reevaluation.

## 2021-02-14 NOTE — ED Provider Notes (Signed)
[facility]    CSN: [id number] Arrival date & time: 02/14/21  0803      History   Chief Complaint Chief Complaint  Patient presents with   Back Pain    Lower    Vaginal Itching    HPI Monique Schmidt is a 35 y.o. female.   Patient presents today with a several day history of lower back pain and urinary symptoms.  She reports some urinary symptoms including dysuria, frequency, urgency.  She also reports vaginal discomfort which she describes as burning and itching consistent with previous episodes of vaginal yeast infection.  She denies any changes to personal hygiene products.  She does have a history of diabetes but states blood sugars are adequately controlled.  She is not taking SGLT2 inhibitor.  She states it is possible she could be pregnant as she missed her menstrual cycle in May.  She does report associated nausea but denies any additional symptoms.  She is not on any hormonal contraception.  She reports back pain is rated 6 on a 0-10 pain scale, localized to thoracic region without radiation, described as aching, no aggravating or alleviating factors identified.  She is having difficulty with daily activities as result of symptoms and is requesting a work excuse note today.  Denies any fever, abdominal pain, chest pain, shortness of breath, vomiting, changes in bowel habits.   Past Medical History:  Diagnosis Date   Abnormal Pap smear    Bacterial vaginosis    Gestational diabetes    likely pre-exisiting; all pregnancies glyburide started 11/4   Gestational thrombocytopenia (HCC)    Headache(784.0)    Hernia, umbilical    History of abnormal cervical Pap smear    normal 2014   Mild preeclampsia delivered 01/22/2016   Type 2 diabetes mellitus (HCC)     Patient Active Problem List   Diagnosis Date Noted   Acute respiratory failure with hypoxia (HCC)    COVID-19 03/31/2020   Cervical high risk HPV (human papillomavirus) test positive 06/29/2019   History of  gestational diabetes 06/26/2019   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   BMI 50.0-59.9, adult (HCC) 11/09/2013   Moderate persistent asthma 08/07/2013   Diabetes mellitus (HCC) 04/16/2011    Past Surgical History:  Procedure Laterality Date   UMBILICAL HERNIA REPAIR N/A 11/22/2014   Procedure: LAPAROSCOPIC UMBILICAL HERNIA;  Surgeon: Armando Ramirez, MD;  Location: [facility];  Service: General;  Laterality: N/A;   WISDOM TOOTH EXTRACTION      OB History     Gravida  6   Para  5   Term  5   Preterm      AB  1   Living  5      SAB  1   IAB      Ectopic      Multiple  0   Live Births  5            Home Medications    Prior to Admission medications   Medication Sig Start Date End Date Taking? Authorizing Provider  fluconazole (DIFLUCAN) 150 MG tablet Take 1 tablet (150 mg total) by mouth every 3 (three) days as needed for up to 2 doses. 02/14/21  Yes Raspet, Erin K, PA-C  metroNIDAZOLE (FLAGYL) 500 MG tablet Take 1 tablet (500 mg total) by mouth 2 (two) times daily. 12/14/20   Lamptey, Philip O, MD  acetaminophen (TYLENOL) 500 MG tablet Take 2 tablets (1,000 mg total) by mouth every 8 (eight) hours as   needed for mild pain or headache. 04/04/20   Elodia Florence., MD  albuterol (VENTOLIN HFA) 108 (90 Base) MCG/ACT inhaler Inhale 1-2 puffs into the lungs every 4 (four) hours as needed for wheezing or shortness of breath. 04/04/20   Elodia Florence., MD  azithromycin (ZITHROMAX) 250 MG tablet Take 1 tablet (250 mg total) by mouth daily. Take first 2 tablets together, then 1 every day until finished. 12/10/20   Evalyse Stroope, Derry Skill, PA-C  blood glucose meter kit and supplies KIT Dispense based on patient and insurance preference. Use up to four times daily as directed. (FOR ICD-9 250.00, 250.01). 03/20/20   Darr, Edison Nasuti, PA-C  blood glucose meter kit and supplies KIT Dispense based on patient and insurance preference. Use up to four times daily as directed. (FOR ICD-9 250.00,  250.01). 04/04/20   Elodia Florence., MD  glucose blood (ACCU-CHEK GUIDE) test strip Use to check blood sugar TID. E11.65 05/26/20   Charlott Rakes, MD  insulin aspart protamine - aspart (NOVOLOG 70/30 MIX) (70-30) 100 UNIT/ML FlexPen Inject 0.5 mLs (50 Units total) into the skin 2 (two) times daily. 04/04/20   Elodia Florence., MD  Insulin Pen Needle 31G X 5 MM MISC Use to inject insulin twice daily E11.65 05/26/20   Charlott Rakes, MD  metFORMIN (GLUCOPHAGE-XR) 500 MG 24 hr tablet Take 2 tablets (1,000 mg total) by mouth 2 (two) times daily. 05/26/20   Charlott Rakes, MD  nystatin (MYCOSTATIN/NYSTOP) powder Apply 1 application topically 3 (three) times daily. 11/23/19   Raylene Everts, MD  nystatin cream (MYCOSTATIN) Apply to affected area 2 times daily 02/14/21   Fredericka Bottcher, Derry Skill, PA-C  nystatin-triamcinolone ointment (MYCOLOG) Apply 1 application topically 2 (two) times daily. 05/23/20   Bast, Tressia Miners A, NP  Pseudoeph-Doxylamine-DM-APAP (NYQUIL PO) Take 30 mLs by mouth at bedtime as needed (cough).     [provider]  tiZANidine (ZANAFLEX) 4 MG tablet Take 1 tablet (4 mg total) by mouth every 6 (six) hours as needed for muscle spasms. 12/10/20   Timothey Dahlstrom, Derry Skill, PA-C  medroxyPROGESTERone (PROVERA) 10 MG tablet Take 1 tablet (10 mg total) by mouth daily. 06/24/19 08/18/19  Donnamae Jude, MD    Family History Family History  Problem Relation Age of Onset   Hypertension Father    Diabetes Father    Diabetes Mother    Seizures Brother     Social History Social History   Tobacco Use   Smoking status: Never   Smokeless tobacco: Never  Vaping Use   Vaping Use: Never used  Substance Use Topics   Alcohol use: No   Drug use: No     Allergies   Latex and Penicillins   Review of Systems Review of Systems  Constitutional:  Negative for activity change, appetite change, fatigue and fever.  Respiratory:  Negative for cough and shortness of breath.   Cardiovascular:   Negative for chest pain.  Gastrointestinal:  Positive for nausea. Negative for abdominal pain, diarrhea and vomiting.  Genitourinary:  Positive for dysuria, frequency, urgency and vaginal pain (irritation and burning). Negative for vaginal bleeding and vaginal discharge.  Musculoskeletal:  Positive for back pain. Negative for arthralgias and myalgias.  Neurological:  Negative for dizziness, light-headedness and headaches.    Physical Exam Triage Vital Signs ED Triage Vitals  Enc Vitals Group     BP 02/14/21 0826 123/87     Pulse Rate 02/14/21 0826 91     Resp  02/14/21 0826 18     Temp 02/14/21 0826 98.4 F (36.9 C)     Temp src --      SpO2 02/14/21 0826 97 %     Weight --      Height --      Head Circumference --      Peak Flow --      Pain Score 02/14/21 0823 8     Pain Loc --      Pain Edu? --      Excl. in [place]? --    No data found.  Updated Vital Signs BP 123/87   Pulse 91   Temp 98.4 F (36.9 C)   Resp 18   SpO2 97%   Visual Acuity Right Eye Distance:   Left Eye Distance:   Bilateral Distance:    Right Eye Near:   Left Eye Near:    Bilateral Near:     Physical Exam Vitals reviewed.  Constitutional:      General: She is awake. She is not in acute distress.    Appearance: Normal appearance. She is normal weight. She is not ill-appearing.     Comments: Very pleasant female appears stated age in no acute distress  HENT:     Head: Normocephalic and atraumatic.  Cardiovascular:     Rate and Rhythm: Normal rate and regular rhythm.     Heart sounds: Normal heart sounds, S1 normal and S2 normal. No murmur heard. Pulmonary:     Effort: Pulmonary effort is normal.     Breath sounds: Normal breath sounds. No wheezing, rhonchi or rales.     Comments: Clear to auscultation bilaterally Abdominal:     General: Bowel sounds are normal.     Palpations: Abdomen is soft.     Tenderness: There is no abdominal tenderness. There is no right CVA tenderness, left CVA  tenderness, guarding or rebound.     Comments: Benign abdominal exam; no tenderness palpation.  Genitourinary:    Comments: Exam deferred Musculoskeletal:     Cervical back: No tenderness or bony tenderness.     Thoracic back: No tenderness or bony tenderness.     Lumbar back: No tenderness or bony tenderness.     Comments: Back: No pain percussion of vertebrae.  No significant tenderness palpation over paraspinal muscles.  Psychiatric:        Behavior: Behavior is cooperative.     UC Treatments / Results  Labs (all labs ordered are listed, but only abnormal results are displayed) Labs Reviewed  POCT URINALYSIS DIPSTICK, ED / UC - Abnormal; Notable for the following components:      Result Value   Glucose, UA >=1000 (*)    Ketones, ur TRACE (*)    Hgb urine dipstick LARGE (*)    All other components within normal limits  URINE CULTURE  POC URINE PREG, ED  CERVICOVAGINAL ANCILLARY ONLY    EKG   Radiology No results found.  Procedures Procedures (including critical care time)  Medications Ordered in UC Medications - No data to display  Initial Impression / Assessment and Plan / UC Course  I have reviewed the triage vital signs and the nursing notes.  Pertinent labs & imaging results that were available during my care of the patient were reviewed by me and considered in my medical decision making (see chart for details).     Urine pregnancy test was negative in clinic today.  UA showed glucosuria with trace hemoglobin and ketones.  No  evidence of acute infection.  We will send urine off for culture but defer antibiotics for the time being.  Discussed with patient that uncontrolled blood sugar and glucosuria could be contributing to urinary symptoms.  This could also be the cause of her vaginal irritation as it can lead to yeast infection.  She was treated with Diflucan and STI swab was sent off.  Discussed that we will contact her and arrange additional treatment based on  laboratory results.  Encouraged her to follow-up with her primary care provider to monitor diabetes.  She can use over-the-counter analgesics for pain relief.  Discussed alarm symptoms or warrant emergent evaluation.  Strict return precautions given to which patient expressed understanding.   Final Clinical Impressions(s) / UC Diagnoses   Final diagnoses:  Urinary urgency  Urinary frequency  Acute bilateral thoracic back pain  Vaginal irritation  Missed menses     Discharge Instructions      Your urine did not show any evidence of infection but did have a significant amount of glucose which I believe is contributing to your symptoms.  This is also one of the reasons you are likely having vaginal irritation and yeast infections.  Please follow-up with your primary care provider for management of diabetes and make sure that you are checking blood sugars regularly.  I have sent your urine off for culture and if we need to start antibiotic we will contact you.  We are going to empirically treat you for a yeast infection with Diflucan.  Take 1 tablet every 3 days for total of 2 tablets.  You can use over-the-counter clotrimazole for additional symptom relief if necessary.  If you have any worsening symptoms including pelvic pain, abdominal pain, fever you need to return for reevaluation.     ED Prescriptions     Medication Sig Dispense Auth. Provider   fluconazole (DIFLUCAN) 150 MG tablet Take 1 tablet (150 mg total) by mouth every 3 (three) days as needed for up to 2 doses. 2 tablet Raspet, Erin K, PA-C   nystatin cream (MYCOSTATIN) Apply to affected area 2 times daily 30 g Raspet, Erin K, PA-C      PDMP not reviewed this encounter.   Raspet, Erin K, PA-C 02/14/21 0903  

## 2021-02-15 DIAGNOSIS — F1299 Cannabis use, unspecified with unspecified cannabis-induced disorder: Secondary | ICD-10-CM | POA: Diagnosis not present

## 2021-02-15 LAB — URINE CULTURE: Culture: 20000 — AB

## 2021-02-15 LAB — CERVICOVAGINAL ANCILLARY ONLY
Bacterial Vaginitis (gardnerella): NEGATIVE
Candida Glabrata: NEGATIVE
Candida Vaginitis: POSITIVE — AB
Chlamydia: NEGATIVE
Comment: NEGATIVE
Comment: NEGATIVE
Comment: NEGATIVE
Comment: NEGATIVE
Comment: NEGATIVE
Comment: NORMAL
Neisseria Gonorrhea: NEGATIVE
Trichomonas: NEGATIVE

## 2021-02-20 DIAGNOSIS — F1299 Cannabis use, unspecified with unspecified cannabis-induced disorder: Secondary | ICD-10-CM | POA: Diagnosis not present

## 2021-02-22 DIAGNOSIS — F1299 Cannabis use, unspecified with unspecified cannabis-induced disorder: Secondary | ICD-10-CM | POA: Diagnosis not present

## 2021-02-27 DIAGNOSIS — F1299 Cannabis use, unspecified with unspecified cannabis-induced disorder: Secondary | ICD-10-CM | POA: Diagnosis not present

## 2021-03-01 DIAGNOSIS — F1299 Cannabis use, unspecified with unspecified cannabis-induced disorder: Secondary | ICD-10-CM | POA: Diagnosis not present

## 2021-03-03 DIAGNOSIS — G8929 Other chronic pain: Secondary | ICD-10-CM | POA: Diagnosis not present

## 2021-03-03 DIAGNOSIS — M546 Pain in thoracic spine: Secondary | ICD-10-CM | POA: Diagnosis not present

## 2021-03-03 DIAGNOSIS — M545 Low back pain, unspecified: Secondary | ICD-10-CM | POA: Diagnosis not present

## 2021-03-05 ENCOUNTER — Ambulatory Visit (HOSPITAL_COMMUNITY)
Admission: EM | Admit: 2021-03-05 | Discharge: 2021-03-05 | Disposition: A | Discharge status: Home / Self Care | Payer: Medicaid Other | Attending: Internal Medicine | Admitting: Internal Medicine

## 2021-03-05 ENCOUNTER — Other Ambulatory Visit: Payer: Self-pay

## 2021-03-05 ENCOUNTER — Encounter (HOSPITAL_COMMUNITY): Payer: Self-pay | Admitting: *Deleted

## 2021-03-05 DIAGNOSIS — B373 Candidiasis of vulva and vagina: Secondary | ICD-10-CM | POA: Diagnosis not present

## 2021-03-05 DIAGNOSIS — B3731 Acute candidiasis of vulva and vagina: Secondary | ICD-10-CM

## 2021-03-05 LAB — POCT URINALYSIS DIPSTICK, ED / UC
Bilirubin Urine: NEGATIVE
Glucose, UA: >=1000 mg/dL — AB
Nitrite: NEGATIVE
Protein, ur: 30 mg/dL — AB
Specific Gravity, Urine: >=1.03 (ref 1.005–1.030)
Urobilinogen, UA: 0.2 mg/dL (ref 0.0–1.0)
pH: 5.5 (ref 5.0–8.0)

## 2021-03-05 LAB — POC URINE PREG, ED: Preg Test, Ur: NEGATIVE

## 2021-03-05 MED ORDER — MONISTAT 7 COMPLETE THERAPY 100-2 MG-% VA KIT: 1.0000 | PACK | Freq: Every day | VAGINAL | 0 refills | Status: DC

## 2021-03-05 MED ORDER — FLUCONAZOLE 150 MG PO TABS: 150.0000 mg | ORAL_TABLET | Freq: Every day | ORAL | 0 refills | Status: DC

## 2021-03-05 NOTE — ED Provider Notes (Signed)
[place]    CSN: [id number] Arrival date & time: 03/05/21  1619      History   Chief Complaint Chief Complaint  Patient presents with   Vaginitis    HPI Monique Schmidt is a 35 y.o. female presenting with vaginal discharge and itching.  Medical history of BV, diabetes, umbilical hernia.  Was last at our urgent care on 02/14/2021 and tested positive for yeast at that time, was treated appropriately with Diflucan and clomitrazole.  States that the symptoms did improve, but today once again endorses 3 days of thick white vaginal discharge with external vaginal itching once again.  Denies new partner since her last screening.  Denies abdominal or pelvic pain, change in bowel or bladder function. Denies hematuria, dysuria, frequency, urgency, back pain, n/v/d/abd pain, fevers/chills, abdnormal vaginal rashes/lesions.    HPI  Past Medical History:  Diagnosis Date   Abnormal Pap smear    Bacterial vaginosis    Gestational diabetes    likely pre-exisiting; all pregnancies glyburide started 11/4   Gestational thrombocytopenia (HCC)    Headache(784.0)    Hernia, umbilical    History of abnormal cervical Pap smear    normal 2014   Mild preeclampsia delivered 01/22/2016   Type 2 diabetes mellitus 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   BMI 50.0-59.9, adult ([place]) 11/09/2013   Moderate persistent asthma 08/07/2013   Diabetes mellitus ([place]) 04/16/2011    Past Surgical History:  Procedure Laterality Date   UMBILICAL HERNIA REPAIR N/A 11/22/2014   Procedure: LAPAROSCOPIC UMBILICAL HERNIA;  Surgeon: Ralene Ok, MD;  Location: [place];  Service: General;  Laterality: N/A;   WISDOM TOOTH EXTRACTION      OB History     Gravida   6   Para  5   Term  5   Preterm      AB  1   Living  5      SAB  1   IAB      Ectopic      Multiple  0   Live Births  5            Home Medications    Prior to Admission medications   Medication Sig Start Date End Date Taking? Authorizing Provider  fluconazole (DIFLUCAN) 150 MG tablet Take 1 tablet (150 mg total) by mouth daily. -For your yeast infection, start the Diflucan (fluconazole)- Take one pill today (day 1). If you're still having symptoms in 3 days, take the second pill. 03/05/21  Yes Hazel Sams, PA-C  metroNIDAZOLE (FLAGYL) 500 MG tablet Take 1 tablet (500 mg total) by mouth 2 (two) times daily. 12/14/20   Lamptey, Myrene Galas, MD  Miconazole Nitrate-Wipes (MONISTAT 7 COMPLETE THERAPY) 100-2 MG-% KIT Place 1 suppository vaginally daily. 1 suppository daily x7 days. 03/05/21  Yes Hazel Sams, PA-C  acetaminophen (TYLENOL) 500 MG tablet Take 2 tablets (1,000 mg total) by mouth every 8 (eight) hours as needed for mild pain or headache. 04/04/20   Elodia Florence., MD  albuterol (VENTOLIN HFA) 108 (90 Base) MCG/ACT inhaler Inhale 1-2 puffs into the lungs every 4 (four) hours as needed for wheezing or shortness of breath. 04/04/20   Elodia Florence.,  MD  azithromycin (ZITHROMAX) 250 MG tablet Take 1 tablet (250 mg total) by mouth daily. Take first 2 tablets together, then 1 every day until finished. 12/10/20   Raspet, Derry Skill, PA-C  blood glucose meter kit and supplies KIT Dispense based on patient and insurance preference. Use up to four times daily as directed. (FOR ICD-9 250.00, 250.01). 03/20/20   Darr, Edison Nasuti, PA-C  blood glucose meter kit and supplies KIT Dispense based on patient and insurance preference. Use up to four times daily as directed. (FOR ICD-9 250.00, 250.01). 04/04/20   Elodia Florence., MD  glucose blood (ACCU-CHEK GUIDE) test strip Use to check blood sugar TID. E11.65 05/26/20   Charlott Rakes, MD  insulin aspart protamine - aspart  (NOVOLOG 70/30 MIX) (70-30) 100 UNIT/ML FlexPen Inject 0.5 mLs (50 Units total) into the skin 2 (two) times daily. 04/04/20   Elodia Florence., MD  Insulin Pen Needle 31G X 5 MM MISC Use to inject insulin twice daily E11.65 05/26/20   Charlott Rakes, MD  metFORMIN (GLUCOPHAGE-XR) 500 MG 24 hr tablet Take 2 tablets (1,000 mg total) by mouth 2 (two) times daily. 05/26/20   Charlott Rakes, MD  nystatin (MYCOSTATIN/NYSTOP) powder Apply 1 application topically 3 (three) times daily. 11/23/19   Raylene Everts, MD  nystatin cream (MYCOSTATIN) Apply to affected area 2 times daily 02/14/21   Raspet, Derry Skill, PA-C  nystatin-triamcinolone ointment (MYCOLOG) Apply 1 application topically 2 (two) times daily. 05/23/20   Bast, Tressia Miners A, NP  Pseudoeph-Doxylamine-DM-APAP (NYQUIL PO) Take 30 mLs by mouth at bedtime as needed (cough).     [provider]  tiZANidine (ZANAFLEX) 4 MG tablet Take 1 tablet (4 mg total) by mouth every 6 (six) hours as needed for muscle spasms. 12/10/20   Raspet, Derry Skill, PA-C  medroxyPROGESTERone (PROVERA) 10 MG tablet Take 1 tablet (10 mg total) by mouth daily. 06/24/19 08/18/19  Donnamae Jude, MD    Family History Family History  Problem Relation Age of Onset   Hypertension Father    Diabetes Father    Diabetes Mother    Seizures Brother     Social History Social History   Tobacco Use   Smoking status: Never   Smokeless tobacco: Never  Vaping Use   Vaping Use: Never used  Substance Use Topics   Alcohol use: No   Drug use: No     Allergies   Latex and Penicillins   Review of Systems Review of Systems  Constitutional:  Negative for appetite change, chills, diaphoresis and fever.  Respiratory:  Negative for shortness of breath.   Cardiovascular:  Negative for chest pain.  Gastrointestinal:  Negative for abdominal pain, blood in stool, constipation, diarrhea, nausea and vomiting.  Genitourinary:  Positive for vaginal discharge. Negative for decreased  urine volume, difficulty urinating, dysuria, flank pain, frequency, genital sores, hematuria, menstrual problem, pelvic pain, urgency, vaginal bleeding and vaginal pain.  Musculoskeletal:  Negative for back pain.  Neurological:  Negative for dizziness, weakness and light-headedness.  All other systems reviewed and are negative.   Physical Exam Triage Vital Signs ED Triage Vitals  Enc Vitals Group     BP 03/05/21 1723 (!) 118/91     Pulse Rate 03/05/21 1723 85     Resp 03/05/21 1723 18     Temp 03/05/21 1723 98.7 F (37.1 C)     Temp src --      SpO2 03/05/21 1723 99 %     Weight --  Height --      Head Circumference --      Peak Flow --      Pain Score 03/05/21 1724 9     Pain Loc --      Pain Edu? --      Excl. in [place]? --    No data found.  Updated Vital Signs BP (!) 118/91   Pulse 85   Temp 98.7 F (37.1 C)   Resp 18   LMP 01/17/2021   SpO2 99%   Visual Acuity Right Eye Distance:   Left Eye Distance:   Bilateral Distance:    Right Eye Near:   Left Eye Near:    Bilateral Near:     Physical Exam Vitals reviewed.  Constitutional:      General: She is not in acute distress.    Appearance: Normal appearance. She is obese. She is not ill-appearing.  HENT:     Head: Normocephalic and atraumatic.     Mouth/Throat:     Mouth: Mucous membranes are moist.     Comments: Moist mucous membranes Eyes:     Extraocular Movements: Extraocular movements intact.     Pupils: Pupils are equal, round, and reactive to light.  Cardiovascular:     Rate and Rhythm: Normal rate and regular rhythm.     Heart sounds: Normal heart sounds.  Pulmonary:     Effort: Pulmonary effort is normal.     Breath sounds: Normal breath sounds. No wheezing, rhonchi or rales.  Abdominal:     General: Bowel sounds are normal. There is no distension.     Palpations: Abdomen is soft. There is no mass.     Tenderness: There is no abdominal tenderness. There is no right CVA tenderness, left CVA  tenderness, guarding or rebound.  Genitourinary:    Comments: deferred Skin:    General: Skin is warm.     Capillary Refill: Capillary refill takes less than 2 seconds.     Comments: Good skin turgor  Neurological:     General: No focal deficit present.     Mental Status: She is alert and oriented to person, place, and time.  Psychiatric:        Mood and Affect: Mood normal.        Behavior: Behavior normal.     UC Treatments / Results  Labs (all labs ordered are listed, but only abnormal results are displayed) Labs Reviewed  POCT URINALYSIS DIPSTICK, ED / UC - Abnormal; Notable for the following components:      Result Value   Glucose, UA >=1000 (*)    Ketones, ur TRACE (*)    Hgb urine dipstick TRACE (*)    Protein, ur 30 (*)    Leukocytes,Ua TRACE (*)    All other components within normal limits  POC URINE PREG, ED  CERVICOVAGINAL ANCILLARY ONLY    EKG   Radiology No results found.  Procedures Procedures (including critical care time)  Medications Ordered in UC Medications - No data to display  Initial Impression / Assessment and Plan / UC Course  I have reviewed the triage vital signs and the nursing notes.  Pertinent labs & imaging results that were available during my care of the patient were reviewed by me and considered in my medical decision making (see chart for details).     This patient is a very pleasant 35 y.o. year old female presenting with suspected vaginal candidiasis.  Afebrile, nontachycardic, no abdominal pain or CVAT.  This patient was last evaluated in the urgent care on 6/28, tested positive for vaginal candida, and was treated appropriately with Diflucan at that time.  UA with trace blood and trace leuk, did not send culture. Urine pregnancy negative. Denies new partners since last STI screen, will send self swab for BV, yeast, gonorrhea, chlamydia, trichomonas.  Diflucan and 7-day monistat at patient request.  ED return  precautions discussed. Patient verbalizes understanding and agreement.   Coding this visit a level 4 for review of past notes and labs, order and interpretation of labs today, and prescription drug management.  Final Clinical Impressions(s) / UC Diagnoses   Final diagnoses:  Vaginal candidiasis     Discharge Instructions      -For your yeast infection, start the Diflucan (fluconazole)- Take one pill today (day 1). If you're still having symptoms in 3 days, take the second pill.  -Monistat once daily x7 days     ED Prescriptions     Medication Sig Dispense Auth. Provider   fluconazole (DIFLUCAN) 150 MG tablet Take 1 tablet (150 mg total) by mouth daily. -For your yeast infection, start the Diflucan (fluconazole)- Take one pill today (day 1). If you're still having symptoms in 3 days, take the second pill. 2 tablet Hazel Sams, PA-C   Miconazole Nitrate-Wipes (MONISTAT 7 COMPLETE THERAPY) 100-2 MG-% KIT Place 1 suppository vaginally daily. 1 suppository daily x7 days. 1 kit [place] Reader      PDMP not reviewed this encounter.   Hazel Sams, PA-C 03/05/21 1827

## 2021-03-05 NOTE — ED Triage Notes (Signed)
Pt reports a vag discharge with itching

## 2021-03-05 NOTE — Discharge Instructions (Addendum)
-  For your yeast infection, start the Diflucan (fluconazole)- Take one pill today (day 1). If you're still having symptoms in 3 days, take the second pill.  -Monistat once daily x7 days

## 2021-03-06 DIAGNOSIS — F1299 Cannabis use, unspecified with unspecified cannabis-induced disorder: Secondary | ICD-10-CM | POA: Diagnosis not present

## 2021-03-06 LAB — CERVICOVAGINAL ANCILLARY ONLY
Bacterial Vaginitis (gardnerella): NEGATIVE
Candida Glabrata: NEGATIVE
Candida Vaginitis: POSITIVE — AB
Chlamydia: NEGATIVE
Comment: NEGATIVE
Comment: NEGATIVE
Comment: NEGATIVE
Comment: NEGATIVE
Comment: NEGATIVE
Comment: NORMAL
Neisseria Gonorrhea: NEGATIVE
Trichomonas: NEGATIVE

## 2021-03-08 DIAGNOSIS — F1299 Cannabis use, unspecified with unspecified cannabis-induced disorder: Secondary | ICD-10-CM | POA: Diagnosis not present

## 2021-03-13 DIAGNOSIS — F1299 Cannabis use, unspecified with unspecified cannabis-induced disorder: Secondary | ICD-10-CM | POA: Diagnosis not present

## 2021-03-15 DIAGNOSIS — F1299 Cannabis use, unspecified with unspecified cannabis-induced disorder: Secondary | ICD-10-CM | POA: Diagnosis not present

## 2021-03-20 DIAGNOSIS — F1299 Cannabis use, unspecified with unspecified cannabis-induced disorder: Secondary | ICD-10-CM | POA: Diagnosis not present

## 2021-03-31 DIAGNOSIS — R030 Elevated blood-pressure reading, without diagnosis of hypertension: Secondary | ICD-10-CM | POA: Diagnosis not present

## 2021-03-31 DIAGNOSIS — N62 Hypertrophy of breast: Secondary | ICD-10-CM | POA: Diagnosis not present

## 2021-03-31 DIAGNOSIS — G8929 Other chronic pain: Secondary | ICD-10-CM | POA: Diagnosis not present

## 2021-03-31 DIAGNOSIS — Z6841 Body Mass Index (BMI) 40.0 and over, adult: Secondary | ICD-10-CM | POA: Diagnosis not present

## 2021-03-31 DIAGNOSIS — Z79899 Other long term (current) drug therapy: Secondary | ICD-10-CM | POA: Diagnosis not present

## 2021-03-31 DIAGNOSIS — M546 Pain in thoracic spine: Secondary | ICD-10-CM | POA: Diagnosis not present

## 2021-03-31 DIAGNOSIS — M792 Neuralgia and neuritis, unspecified: Secondary | ICD-10-CM | POA: Diagnosis not present

## 2021-04-03 ENCOUNTER — Encounter (HOSPITAL_COMMUNITY): Payer: Self-pay

## 2021-04-03 ENCOUNTER — Emergency Department (HOSPITAL_COMMUNITY): Payer: Medicaid Other

## 2021-04-03 ENCOUNTER — Other Ambulatory Visit: Payer: Self-pay

## 2021-04-03 ENCOUNTER — Emergency Department (HOSPITAL_COMMUNITY)
Admission: EM | Admit: 2021-04-03 | Discharge: 2021-04-03 | Disposition: A | Discharge status: Home / Self Care | Payer: Medicaid Other | Attending: Emergency Medicine | Admitting: Emergency Medicine

## 2021-04-03 DIAGNOSIS — Z8616 Personal history of COVID-19: Secondary | ICD-10-CM | POA: Insufficient documentation

## 2021-04-03 DIAGNOSIS — J4540 Moderate persistent asthma, uncomplicated: Secondary | ICD-10-CM | POA: Diagnosis not present

## 2021-04-03 DIAGNOSIS — R1310 Dysphagia, unspecified: Secondary | ICD-10-CM | POA: Insufficient documentation

## 2021-04-03 DIAGNOSIS — R0989 Other specified symptoms and signs involving the circulatory and respiratory systems: Secondary | ICD-10-CM | POA: Diagnosis not present

## 2021-04-03 DIAGNOSIS — E119 Type 2 diabetes mellitus without complications: Secondary | ICD-10-CM | POA: Insufficient documentation

## 2021-04-03 DIAGNOSIS — T189XXA Foreign body of alimentary tract, part unspecified, initial encounter: Secondary | ICD-10-CM

## 2021-04-03 DIAGNOSIS — X58XXXA Exposure to other specified factors, initial encounter: Secondary | ICD-10-CM | POA: Diagnosis not present

## 2021-04-03 DIAGNOSIS — Z79899 Other long term (current) drug therapy: Secondary | ICD-10-CM | POA: Diagnosis not present

## 2021-04-03 DIAGNOSIS — Z91040 Latex allergy status: Secondary | ICD-10-CM | POA: Insufficient documentation

## 2021-04-03 DIAGNOSIS — F458 Other somatoform disorders: Secondary | ICD-10-CM | POA: Diagnosis not present

## 2021-04-03 DIAGNOSIS — R198 Other specified symptoms and signs involving the digestive system and abdomen: Secondary | ICD-10-CM

## 2021-04-03 DIAGNOSIS — R590 Localized enlarged lymph nodes: Secondary | ICD-10-CM | POA: Diagnosis not present

## 2021-04-03 LAB — POC URINE PREG, ED: Preg Test, Ur: NEGATIVE

## 2021-04-03 MED ORDER — LIDOCAINE VISCOUS HCL 2 % MT SOLN
15.0000 mL | Freq: Once | OROMUCOSAL | Status: AC
  Administered 2021-04-03: 15 mL via ORAL
  Filled 2021-04-03: qty 15

## 2021-04-03 MED ORDER — ALUM & MAG HYDROXIDE-SIMETH 200-200-20 MG/5ML PO SUSP
30.0000 mL | Freq: Once | ORAL | Status: AC
  Administered 2021-04-03: 30 mL via ORAL
  Filled 2021-04-03: qty 30

## 2021-04-03 NOTE — Discharge Instructions (Addendum)
You were seen in the ED for an eval of a swallowed piece of plastic. Your initial Xray showed a possible piece of plastic stuck in the esophagus, but a repeat CAT scan 5 hours later showed that this was no longer present in the esophagus.  Even able to eat and drink in the emergency department without difficulty and you have likely passed this piece of plastic into the stomach.  The sensation of a retained foreign body is called a "globus sensation" and is common in this scenario.  Please continue to drink lots of water, and return to the emergency department if you have new or worsening throat pain, difficulty with eating or drinking, vomiting, fever, chest pain abdominal pain, blood in stool or any other concerning symptoms.

## 2021-04-03 NOTE — ED Triage Notes (Signed)
Pt reports drinking out of a plastic cup and swallowing a piece of the cup around 2300. Pt reports trouble swallowing, pt is able to speak.

## 2021-04-03 NOTE — ED Provider Notes (Signed)
Emergency Medicine Provider Triage Evaluation Note  Monique Schmidt , a 35 y.o. female  was evaluated in triage.  Pt complains of FB sensation in throat.  Swallowed piece of plastic.  Thinks it still stuck.  Not drooling.  Tolerating PO.  Review of Systems  Positive: Sore throat, FB sensation Negative: Vomiting, drooling  Physical Exam  BP (!) 142/113 (BP Location: Left Arm)   Pulse 82   Temp 98.4 F (36.9 C) (Oral)   Resp 18   Ht 5\' 6"  (1.676 m)   Wt (!) 137 kg   SpO2 100%   BMI 48.74 kg/m  Gen:   Awake, no distress   Resp:  Normal effort  MSK:   Moves extremities without difficulty  Other:    Medical Decision Making  Medically screening exam initiated at 2:06 AM.  Appropriate orders placed.  was informed that the remainder of the evaluation will be completed by another provider, this initial triage assessment does not replace that evaluation, and the importance of remaining in the ED until their evaluation is complete.  FB sensation in throat?   [name], PA-C 04/03/21 0207    04/05/21, MD 04/03/21 [phone]

## 2021-04-04 NOTE — ED Provider Notes (Signed)
[facility]-EMERGENCY DEPT Provider Note   CSN: [id number] Arrival date & time: 04/03/21  0139     History Chief Complaint  Patient presents with   foreign body in throat     Monique Schmidt is a 35 y.o. female with PMH T2DM, obesity who presents to the emergency department for evaluation of a swallowed foreign body.  The patient states that at approximately 2300 on 04/02/2021 she accidentally swallowed a small place of plastic from her water bottle.  She endorses dysphagia and feeling of a foreign body stuck in in her esophagus.  Denies dyspnea, vomiting, drooling or trismus.  Chest pain, shortness of breath, abdominal pain, nausea, diarrhea, fever or any other systemic symptoms.  HPI     Past Medical History:  Diagnosis Date   Abnormal Pap smear    Bacterial vaginosis    Gestational diabetes    likely pre-exisiting; all pregnancies glyburide started 11/4   Gestational thrombocytopenia (HCC)    Headache(784.0)    Hernia, umbilical    History of abnormal cervical Pap smear    normal 2014   Mild preeclampsia delivered 01/22/2016   Type 2 diabetes mellitus [facility])     Patient Active Problem List   Diagnosis Date Noted   Acute respiratory failure with hypoxia (HCC)    COVID-19 03/31/2020   Cervical high risk HPV (human papillomavirus) test positive 06/29/2019   History of gestational diabetes 06/26/2019   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   BMI 50.0-59.9, adult (HCC) 11/09/2013   Moderate persistent asthma 08/07/2013   Diabetes mellitus (HCC) 04/16/2011    Past Surgical History:  Procedure Laterality Date   UMBILICAL HERNIA REPAIR N/A 11/22/2014   Procedure: LAPAROSCOPIC UMBILICAL HERNIA;  Surgeon: Axel Filler, MD;  Location: [facility] OR;  Service: General;  Laterality: N/A;   WISDOM TOOTH EXTRACTION       OB History     Gravida  6   Para  5   Term  5   Preterm      AB  1   Living  5      SAB  1   IAB      Ectopic      Multiple  0    Live Births  5           Family History  Problem Relation Age of Onset   Hypertension Father    Diabetes Father    Diabetes Mother    Seizures Brother     Social History   Tobacco Use   Smoking status: Never   Smokeless tobacco: Never  Vaping Use   Vaping Use: Never used  Substance Use Topics   Alcohol use: No   Drug use: No    Home Medications Prior to Admission medications   Medication Sig Start Date End Date Taking? Authorizing Provider  medroxyPROGESTERone (PROVERA) 10 MG tablet Take 1 tablet (10 mg total) by mouth daily. 06/24/19 08/18/19  Reva Bores, MD    Allergies    Latex and Penicillins  Review of Systems   Review of Systems  Constitutional:  Negative for chills and fever.  HENT:  Positive for trouble swallowing. Negative for ear pain and sore throat.   Eyes:  Negative for pain and visual disturbance.  Respiratory:  Negative for cough and shortness of breath.   Cardiovascular:  Negative for chest pain and palpitations.  Gastrointestinal:  Negative for abdominal pain and vomiting.  Genitourinary:  Negative for dysuria and hematuria.  Musculoskeletal:  Negative  for arthralgias and back pain.  Skin:  Negative for color change and rash.  Neurological:  Negative for seizures and syncope.  All other systems reviewed and are negative.  Physical Exam Updated Vital Signs BP (!) 129/104   Pulse 94   Temp 98.8 F (37.1 C)   Resp 20   Ht 5\' 6"  (1.676 m)   Wt (!) 137 kg   SpO2 97%   BMI 48.74 kg/m   Physical Exam Vitals and nursing note reviewed.  Constitutional:      General: She is not in acute distress.    Appearance: She is well-developed.  HENT:     Head: Normocephalic and atraumatic.  Eyes:     Conjunctiva/sclera: Conjunctivae normal.  Cardiovascular:     Rate and Rhythm: Normal rate and regular rhythm.     Heart sounds: No murmur heard. Pulmonary:     Effort: Pulmonary effort is normal. No respiratory distress.     Breath sounds:  Normal breath sounds.  Abdominal:     Palpations: Abdomen is soft.     Tenderness: There is no abdominal tenderness.  Musculoskeletal:     Cervical back: Neck supple.  Skin:    General: Skin is warm and dry.  Neurological:     Mental Status: She is alert.    ED Results / Procedures / Treatments   Labs (all labs ordered are listed, but only abnormal results are displayed) Labs Reviewed  POC URINE PREG, ED    EKG None  Radiology DG Neck Soft Tissue  Result Date: 04/03/2021 CLINICAL DATA:  Swallowed a piece of plastic with persistent foreign body sensation in throat. EXAM: NECK SOFT TISSUES - 1+ VIEW COMPARISON:  None. FINDINGS: A segment of the subglottic airway is not well aerated on the frontal view. There is no evidence of epiglottic enlargement. An area of opacification is seen overlying the subglottic airway on the lateral view, at the level of the aryepiglottic folds. Very small mildly radiopaque linear and lobulated foci are also seen overlying the airway at this level. IMPRESSION: Findings suspicious for the presence of a small ingested foreign body at the level of the subglottic trachea. CT correlation is recommended. Electronically Signed   By: 04/05/2021 M.D.   On: 04/03/2021 02:51   CT Soft Tissue Neck Wo Contrast  Result Date: 04/03/2021 CLINICAL DATA:  Foreign body suspected EXAM: CT NECK WITHOUT CONTRAST TECHNIQUE: Multidetector CT imaging of the neck was performed following the standard protocol without intravenous contrast. COMPARISON:  Neck radiograph obtained the same day FINDINGS: Pharynx and larynx: Normal.  No foreign body identified. Salivary glands: The parotid and submandibular glands are unremarkable. Thyroid: Unremarkable. Lymph nodes: There are scattered prominent though not pathologically enlarged cervical chain lymph nodes measuring up to 1.3 cm, nonspecific. Vascular: There is no significant vascular calcification. Limited intracranial: The imaged  portions of the intracranial compartment are unremarkable. Visualized orbits: The imaged globes and orbits are unremarkable. Mastoids and visualized paranasal sinuses: The imaged paranasal sinuses and mastoid air cells are clear. Skeleton: There is no acute osseous abnormality. Upper chest: The imaged lung apices are clear. Other: None. IMPRESSION: Unremarkable study; no foreign body identified. Electronically Signed   By: 04/05/2021 M.D.   On: 04/03/2021 07:45    Procedures Procedures   Medications Ordered in ED Medications  alum & mag hydroxide-simeth (MAALOX/MYLANTA) 200-200-20 MG/5ML suspension 30 mL (30 mLs Oral Given 04/03/21 0833)    And  lidocaine (XYLOCAINE) 2 % viscous mouth solution  15 mL (15 mLs Oral Given 04/03/21 8563)    ED Course  I have reviewed the triage vital signs and the nursing notes.  Pertinent labs & imaging results that were available during my care of the patient were reviewed by me and considered in my medical decision making (see chart for details).   MDM Rules/Calculators/A&P                           Patient seen in the emergency department for evaluation of a swallowed foreign body.  Physical exam is largely unremarkable.  Initial x-ray obtained while the patient was in the lobby 5 hours prior to my evaluation reveals possible evidence of a esophageal foreign body level of the subglottic trachea.  The patient has been able to drink water since this x-ray and received a follow-up confirmatory CT soft tissue neck that shows no evidence of foreign body.  On reevaluation, the patient still endorses a foreign body sensation but states that it is improved from initial presentation.  She was Given a GI cocktail with viscous lidocaine for comfort, and was able to tolerate solid and liquid p.o. in the emergency department.  I suspect the patient likely passed the small piece of plastic into the stomach between initial x-ray and CT, and when offered repeat x-ray imaging,  the patient declined and requested discharge.  As the patient was able to tolerate p.o., I have low suspicion for esophageal rupture or impacted foreign body, and she was given strict return precautions which she voiced understanding.  Patient then discharged. Final Clinical Impression(s) / ED Diagnoses Final diagnoses:  Swallowed foreign body, initial encounter  Globus sensation    Rx / DC Orders ED Discharge Orders     None        Kiwana Deblasi, MD 04/04/21 0730

## 2021-04-12 ENCOUNTER — Other Ambulatory Visit: Payer: Self-pay

## 2021-04-12 ENCOUNTER — Encounter (HOSPITAL_COMMUNITY): Payer: Self-pay

## 2021-04-12 ENCOUNTER — Ambulatory Visit (HOSPITAL_COMMUNITY)
Admission: EM | Admit: 2021-04-12 | Discharge: 2021-04-12 | Disposition: A | Discharge status: Home / Self Care | Payer: Medicaid Other | Attending: Emergency Medicine | Admitting: Emergency Medicine

## 2021-04-12 DIAGNOSIS — L259 Unspecified contact dermatitis, unspecified cause: Secondary | ICD-10-CM | POA: Diagnosis not present

## 2021-04-12 DIAGNOSIS — B379 Candidiasis, unspecified: Secondary | ICD-10-CM | POA: Diagnosis not present

## 2021-04-12 MED ORDER — FLUCONAZOLE 150 MG PO TABS: ORAL_TABLET | ORAL | 0 refills | Status: DC

## 2021-04-12 MED ORDER — NYSTATIN 100000 UNIT/GM EX CREA: TOPICAL_CREAM | CUTANEOUS | 0 refills | Status: DC

## 2021-04-12 MED ORDER — HYDROCORTISONE 1 % EX CREA: TOPICAL_CREAM | CUTANEOUS | 0 refills | Status: DC

## 2021-04-12 NOTE — Discharge Instructions (Addendum)
Take 1 Diflucan pill today and another in 3 days if you are still having symptoms. You can apply the nystatin cream twice a day as needed. You can use the hydrocortisone cream twice a day as needed for itching.  You may continue to take Benadryl as needed for severe itching.  Try to keep the areas under your breasts clean and dry.  Avoid tight fitting clothing.  Try to wear breathable fabrics such as cotton.  Return or go to the Emergency Department if symptoms worsen or do not improve in the next few days.

## 2021-04-12 NOTE — ED Triage Notes (Signed)
Pt reports rash in neck and back x 2 days. Benadryl gives no relief

## 2021-04-12 NOTE — ED Provider Notes (Signed)
[facility]    CSN: [id number] Arrival date & time: 04/12/21  1012      History   Chief Complaint Chief Complaint  Patient presents with   Rash    HPI Monique Schmidt is a 35 y.o. female.   Patient here for evaluation of rash and itching in vagina, under both breasts, on back, and on left neck.  Reports symptoms similar to yeast infections that she has had in the past.  Reports taking Benadryl with minimal relief of itching.  Denies any trauma, injury, or other precipitating event.  Denies any specific alleviating or aggravating factors.  Denies any fevers, chest pain, shortness of breath, N/V/D, numbness, tingling, weakness, abdominal pain, or headaches.    The history is provided by the patient.  Rash  Past Medical History:  Diagnosis Date   Abnormal Pap smear    Bacterial vaginosis    Gestational diabetes    likely pre-exisiting; all pregnancies glyburide started 11/4   Gestational thrombocytopenia (HCC)    Headache(784.0)    Hernia, umbilical    History of abnormal cervical Pap smear    normal 2014   Mild preeclampsia delivered 01/22/2016   Type 2 diabetes mellitus [facility])     Patient Active Problem List   Diagnosis Date Noted   Acute respiratory failure with hypoxia (HCC)    COVID-19 03/31/2020   Cervical high risk HPV (human papillomavirus) test positive 06/29/2019   History of gestational diabetes 06/26/2019   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   BMI 50.0-59.9, adult (HCC) 11/09/2013   Moderate persistent asthma 08/07/2013   Diabetes mellitus (HCC) 04/16/2011    Past Surgical History:  Procedure Laterality Date   UMBILICAL HERNIA REPAIR N/A 11/22/2014   Procedure: LAPAROSCOPIC UMBILICAL HERNIA;  Surgeon: Axel Filler, MD;  Location: [facility] OR;  Service: General;  Laterality: N/A;   WISDOM TOOTH EXTRACTION      OB History     Gravida  6   Para  5   Term  5   Preterm      AB  1   Living  5      SAB  1   IAB      Ectopic       Multiple  0   Live Births  5            Home Medications    Prior to Admission medications   Medication Sig Start Date End Date Taking? Authorizing Provider  fluconazole (DIFLUCAN) 150 MG tablet Take one pill today.  Take the second pill in 3 days if you are are still having symptoms. 04/12/21  Yes Ivette Loyal, NP  hydrocortisone cream 1 % Apply to affected area 2 times daily 04/12/21  Yes Ivette Loyal, NP  nystatin cream (MYCOSTATIN) Apply to affected area 2 times daily 04/12/21  Yes Ivette Loyal, NP  medroxyPROGESTERone (PROVERA) 10 MG tablet Take 1 tablet (10 mg total) by mouth daily. 06/24/19 08/18/19  Reva Bores, MD    Family History Family History  Problem Relation Age of Onset   Hypertension Father    Diabetes Father    Diabetes Mother    Seizures Brother     Social History Social History   Tobacco Use   Smoking status: Never   Smokeless tobacco: Never  Vaping Use   Vaping Use: Never used  Substance Use Topics   Alcohol use: No   Drug use: No     Allergies   Latex and  Penicillins   Review of Systems Review of Systems  Genitourinary:  Negative for vaginal discharge.  Skin:  Positive for rash.  All other systems reviewed and are negative.   Physical Exam Triage Vital Signs ED Triage Vitals  Enc Vitals Group     BP 04/12/21 1124 (!) 119/94     Pulse Rate 04/12/21 1124 87     Resp 04/12/21 1124 18     Temp 04/12/21 1124 98.5 F (36.9 C)     Temp Source 04/12/21 1124 Oral     SpO2 04/12/21 1124 99 %     Weight --      Height --      Head Circumference --      Peak Flow --      Pain Score 04/12/21 1122 0     Pain Loc --      Pain Edu? --      Excl. in GC? --    No data found.  Updated Vital Signs BP (!) 119/94 (BP Location: Right Wrist)   Pulse 87   Temp 98.5 F (36.9 C) (Oral)   Resp 18   LMP  (Within Weeks) Comment: 3 weeks  SpO2 99%   Visual Acuity Right Eye Distance:   Left Eye Distance:   Bilateral Distance:     Right Eye Near:   Left Eye Near:    Bilateral Near:     Physical Exam Vitals and nursing note reviewed.  Constitutional:      General: She is not in acute distress.    Appearance: Normal appearance. She is not ill-appearing, toxic-appearing or diaphoretic.  HENT:     Head: Normocephalic and atraumatic.  Eyes:     Conjunctiva/sclera: Conjunctivae normal.  Cardiovascular:     Rate and Rhythm: Normal rate.     Pulses: Normal pulses.  Pulmonary:     Effort: Pulmonary effort is normal.  Abdominal:     General: Abdomen is flat.  Genitourinary:    Comments: declines Musculoskeletal:        General: Normal range of motion.     Cervical back: Normal range of motion.  Skin:    General: Skin is warm and dry.     Findings: Rash (left side of neck, under bilateral breasts, and in skin folds on back) present. Rash is scaling.       Neurological:     General: No focal deficit present.     Mental Status: She is alert and oriented to person, place, and time.  Psychiatric:        Mood and Affect: Mood normal.     UC Treatments / Results  Labs (all labs ordered are listed, but only abnormal results are displayed) Labs Reviewed - No data to display  EKG   Radiology No results found.  Procedures Procedures (including critical care time)  Medications Ordered in UC Medications - No data to display  Initial Impression / Assessment and Plan / UC Course  I have reviewed the triage vital signs and the nursing notes.  Pertinent labs & imaging results that were available during my care of the patient were reviewed by me and considered in my medical decision making (see chart for details).    Assessment negative for red flags or concerns.  Yeast infection likely in vagina, and under bilateral breasts.  Will treat with  Diflucan.  May use nystatin cream in vagina as well.  Neck with likely contact dermatitis.  May use hydrocortisone cream as needed.  Continue to take Benadryl as  needed for severe itching.  Instructed to keep skin folds, including the area underneath breasts, clean and dry.  Recommend that patient avoid tight fitting clothing and wear breathable fabrics.  Follow-up as needed Final Clinical Impressions(s) / UC Diagnoses   Final diagnoses:  Yeast infection  Contact dermatitis, unspecified contact dermatitis type, unspecified trigger     Discharge Instructions      Take 1 Diflucan pill today and another in 3 days if you are still having symptoms. You can apply the nystatin cream twice a day as needed. You can use the hydrocortisone cream twice a day as needed for itching.  You may continue to take Benadryl as needed for severe itching.  Try to keep the areas under your breasts clean and dry.  Avoid tight fitting clothing.  Try to wear breathable fabrics such as cotton.  Return or go to the Emergency Department if symptoms worsen or do not improve in the next few days.      ED Prescriptions     Medication Sig Dispense Auth. Provider   fluconazole (DIFLUCAN) 150 MG tablet Take one pill today.  Take the second pill in 3 days if you are are still having symptoms. 2 tablet Ivette Loyal, NP   nystatin cream (MYCOSTATIN) Apply to affected area 2 times daily 30 g Ivette Loyal, NP   hydrocortisone cream 1 % Apply to affected area 2 times daily 15 g Ivette Loyal, NP      PDMP not reviewed this encounter.   Ivette Loyal, NP 04/12/21 [phone]

## 2021-04-21 ENCOUNTER — Ambulatory Visit (HOSPITAL_COMMUNITY)
Admission: EM | Admit: 2021-04-21 | Discharge: 2021-04-21 | Disposition: A | Discharge status: Home / Self Care | Payer: Medicaid Other | Attending: Internal Medicine | Admitting: Internal Medicine

## 2021-04-21 ENCOUNTER — Other Ambulatory Visit: Payer: Self-pay

## 2021-04-21 ENCOUNTER — Encounter (HOSPITAL_COMMUNITY): Payer: Self-pay | Admitting: Emergency Medicine

## 2021-04-21 DIAGNOSIS — R21 Rash and other nonspecific skin eruption: Secondary | ICD-10-CM

## 2021-04-21 MED ORDER — PREDNISONE 20 MG PO TABS: 20.0000 mg | ORAL_TABLET | Freq: Every day | ORAL | 0 refills | Status: AC

## 2021-04-21 MED ORDER — TERBINAFINE HCL 250 MG PO TABS: 250.0000 mg | ORAL_TABLET | Freq: Every day | ORAL | 0 refills | Status: DC

## 2021-04-21 MED ORDER — HYDROXYZINE HCL 25 MG PO TABS: 25.0000 mg | ORAL_TABLET | Freq: Three times a day (TID) | ORAL | 0 refills | Status: DC | PRN

## 2021-04-21 NOTE — ED Provider Notes (Signed)
[facility]    CSN: [id number] Arrival date & time: 04/21/21  0932      History   Chief Complaint Chief Complaint  Patient presents with   Rash    HPI Monique Schmidt is a 35 y.o. female comes to the urgent care with a few days history of pruritic rash over the chest, low back and left hip area.  The rash started over the chest mainly in between her breasts.  It has spread over the upper chest under her breast and her lower back.  No fever or chills.  Patient has tried Benadryl with no improvement in her symptoms.  She recently completed a course of fluconazole. Patient denies any fever or chills.  HPI  Past Medical History:  Diagnosis Date   Abnormal Pap smear    Bacterial vaginosis    Gestational diabetes    likely pre-exisiting; all pregnancies glyburide started 11/4   Gestational thrombocytopenia (HCC)    Headache(784.0)    Hernia, umbilical    History of abnormal cervical Pap smear    normal 2014   Mild preeclampsia delivered 01/22/2016   Type 2 diabetes mellitus [facility])     Patient Active Problem List   Diagnosis Date Noted   Acute respiratory failure with hypoxia (HCC)    COVID-19 03/31/2020   Cervical high risk HPV (human papillomavirus) test positive 06/29/2019   History of gestational diabetes 06/26/2019   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   BMI 50.0-59.9, adult (HCC) 11/09/2013   Moderate persistent asthma 08/07/2013   Diabetes mellitus (HCC) 04/16/2011    Past Surgical History:  Procedure Laterality Date   UMBILICAL HERNIA REPAIR N/A 11/22/2014   Procedure: LAPAROSCOPIC UMBILICAL HERNIA;  Surgeon: Axel Filler, MD;  Location: [facility] OR;  Service: General;  Laterality: N/A;   WISDOM TOOTH EXTRACTION      OB History     Gravida  6   Para  5   Term  5   Preterm      AB  1   Living  5      SAB  1   IAB      Ectopic      Multiple  0   Live Births  5            Home Medications    Prior to Admission medications    Medication Sig Start Date End Date Taking? Authorizing Provider  fluconazole (DIFLUCAN) 150 MG tablet Take one pill today.  Take the second pill in 3 days if you are are still having symptoms. 04/12/21   Ivette Loyal, NP  hydrocortisone cream 1 % Apply to affected area 2 times daily 04/12/21   Ivette Loyal, NP  nystatin cream (MYCOSTATIN) Apply to affected area 2 times daily 04/12/21   Ivette Loyal, NP  medroxyPROGESTERone (PROVERA) 10 MG tablet Take 1 tablet (10 mg total) by mouth daily. 06/24/19 08/18/19  Reva Bores, MD    Family History Family History  Problem Relation Age of Onset   Hypertension Father    Diabetes Father    Diabetes Mother    Seizures Brother     Social History Social History   Tobacco Use   Smoking status: Never   Smokeless tobacco: Never  Vaping Use   Vaping Use: Never used  Substance Use Topics   Alcohol use: No   Drug use: No     Allergies   Latex and Penicillins   Review of Systems Review of Systems  Musculoskeletal: Negative.   Skin:  Positive for color change and rash.    Physical Exam Triage Vital Signs ED Triage Vitals  Enc Vitals Group     BP 04/21/21 0953 120/79     Pulse Rate 04/21/21 0953 89     Resp --      Temp 04/21/21 0953 99 F (37.2 C)     Temp Source 04/21/21 0953 Oral     SpO2 04/21/21 0953 98 %     Weight --      Height --      Head Circumference --      Peak Flow --      Pain Score 04/21/21 0954 0     Pain Loc --      Pain Edu? --      Excl. in GC? --    No data found.  Updated Vital Signs BP 120/79 (BP Location: Right Wrist)   Pulse 89   Temp 99 F (37.2 C) (Oral)   LMP 03/16/2021   SpO2 98%   Visual Acuity Right Eye Distance:   Left Eye Distance:   Bilateral Distance:    Right Eye Near:   Left Eye Near:    Bilateral Near:     Physical Exam Vitals and nursing note reviewed.  Constitutional:      Appearance: Normal appearance. She is obese. She is not ill-appearing.   Cardiovascular:     Rate and Rhythm: Normal rate and regular rhythm.     Pulses: Normal pulses.     Heart sounds: Normal heart sounds.  Musculoskeletal:        General: Normal range of motion.  Skin:    Comments: Extensive rash under her breast and in between her breasts.  She has similar rash over the lower back and in the left groin area.  The rash is erythematous with well-defined borders.  Excoriation present.  Neurological:     Mental Status: She is alert.     UC Treatments / Results  Labs (all labs ordered are listed, but only abnormal results are displayed) Labs Reviewed - No data to display  EKG   Radiology No results found.  Procedures Procedures (including critical care time)  Medications Ordered in UC Medications - No data to display  Initial Impression / Assessment and Plan / UC Course  I have reviewed the triage vital signs and the nursing notes.  Pertinent labs & imaging results that were available during my care of the patient were reviewed by me and considered in my medical decision making (see chart for details).     1.  Rash Lamisil 250 mg daily for 7 days Prednisone 20 mg orally daily for 5 days Hypoallergenic body lotion Mild soap for bathing Return precautions given. Final Clinical Impressions(s) / UC Diagnoses   Final diagnoses:  None   Discharge Instructions   None    ED Prescriptions   None    PDMP not reviewed this encounter.   Merrilee Jansky, MD 04/21/21 [phone]

## 2021-04-21 NOTE — ED Triage Notes (Signed)
Pt reports broke out in rash all over body after taking the antibiotic for yeast infection she was seen for last week. Pt reports itching. Benadryl isn't helping. Using cream that was given

## 2021-04-21 NOTE — Discharge Instructions (Addendum)
Take medications as prescribed If symptoms worsen please return to the urgent care to be reevaluated

## 2021-04-26 DIAGNOSIS — F1299 Cannabis use, unspecified with unspecified cannabis-induced disorder: Secondary | ICD-10-CM | POA: Diagnosis not present

## 2021-05-01 DIAGNOSIS — F1299 Cannabis use, unspecified with unspecified cannabis-induced disorder: Secondary | ICD-10-CM | POA: Diagnosis not present

## 2021-05-02 ENCOUNTER — Institutional Professional Consult (permissible substitution): Payer: Medicaid Other | Admitting: Plastic Surgery

## 2021-05-03 DIAGNOSIS — F1299 Cannabis use, unspecified with unspecified cannabis-induced disorder: Secondary | ICD-10-CM | POA: Diagnosis not present

## 2021-05-08 DIAGNOSIS — F1299 Cannabis use, unspecified with unspecified cannabis-induced disorder: Secondary | ICD-10-CM | POA: Diagnosis not present

## 2021-05-10 DIAGNOSIS — F1299 Cannabis use, unspecified with unspecified cannabis-induced disorder: Secondary | ICD-10-CM | POA: Diagnosis not present

## 2021-05-15 ENCOUNTER — Encounter (HOSPITAL_COMMUNITY): Payer: Self-pay | Admitting: *Deleted

## 2021-05-15 ENCOUNTER — Ambulatory Visit (HOSPITAL_COMMUNITY)
Admission: EM | Admit: 2021-05-15 | Discharge: 2021-05-15 | Disposition: A | Discharge status: Home / Self Care | Payer: Medicaid Other | Attending: Internal Medicine | Admitting: Internal Medicine

## 2021-05-15 ENCOUNTER — Other Ambulatory Visit: Payer: Self-pay

## 2021-05-15 DIAGNOSIS — Z20822 Contact with and (suspected) exposure to covid-19: Secondary | ICD-10-CM | POA: Insufficient documentation

## 2021-05-15 DIAGNOSIS — Z91040 Latex allergy status: Secondary | ICD-10-CM | POA: Diagnosis not present

## 2021-05-15 DIAGNOSIS — E1165 Type 2 diabetes mellitus with hyperglycemia: Secondary | ICD-10-CM | POA: Diagnosis not present

## 2021-05-15 DIAGNOSIS — J069 Acute upper respiratory infection, unspecified: Secondary | ICD-10-CM | POA: Diagnosis not present

## 2021-05-15 DIAGNOSIS — R059 Cough, unspecified: Secondary | ICD-10-CM | POA: Diagnosis not present

## 2021-05-15 DIAGNOSIS — Z8489 Family history of other specified conditions: Secondary | ICD-10-CM | POA: Insufficient documentation

## 2021-05-15 DIAGNOSIS — Z880 Allergy status to penicillin: Secondary | ICD-10-CM | POA: Diagnosis not present

## 2021-05-15 DIAGNOSIS — B373 Candidiasis of vulva and vagina: Secondary | ICD-10-CM | POA: Diagnosis not present

## 2021-05-15 DIAGNOSIS — Z833 Family history of diabetes mellitus: Secondary | ICD-10-CM | POA: Diagnosis not present

## 2021-05-15 DIAGNOSIS — Z113 Encounter for screening for infections with a predominantly sexual mode of transmission: Secondary | ICD-10-CM | POA: Insufficient documentation

## 2021-05-15 DIAGNOSIS — N898 Other specified noninflammatory disorders of vagina: Secondary | ICD-10-CM | POA: Insufficient documentation

## 2021-05-15 DIAGNOSIS — Z8616 Personal history of COVID-19: Secondary | ICD-10-CM | POA: Insufficient documentation

## 2021-05-15 DIAGNOSIS — F1299 Cannabis use, unspecified with unspecified cannabis-induced disorder: Secondary | ICD-10-CM | POA: Diagnosis not present

## 2021-05-15 LAB — POCT URINALYSIS DIPSTICK, ED / UC
Bilirubin Urine: NEGATIVE
Glucose, UA: >=1000 mg/dL — AB
Hgb urine dipstick: NEGATIVE
Leukocytes,Ua: NEGATIVE
Nitrite: NEGATIVE
Protein, ur: NEGATIVE mg/dL
Specific Gravity, Urine: 1.025 (ref 1.005–1.030)
Urobilinogen, UA: 0.2 mg/dL (ref 0.0–1.0)
pH: 5.5 (ref 5.0–8.0)

## 2021-05-15 LAB — COMPREHENSIVE METABOLIC PANEL
ALT: 15 U/L (ref 0–44)
AST: 14 U/L — ABNORMAL LOW (ref 15–41)
Albumin: 3.2 g/dL — ABNORMAL LOW (ref 3.5–5.0)
Alkaline Phosphatase: 68 U/L (ref 38–126)
Anion gap: 7 (ref 5–15)
BUN: 7 mg/dL (ref 6–20)
CO2: 27 mmol/L (ref 22–32)
Calcium: 8.6 mg/dL — ABNORMAL LOW (ref 8.9–10.3)
Chloride: 100 mmol/L (ref 98–111)
Creatinine, Ser: 0.64 mg/dL (ref 0.44–1.00)
GFR, Estimated: >60 mL/min (ref >60)
Glucose, Bld: 353 mg/dL — ABNORMAL HIGH (ref 70–99)
Potassium: 3.7 mmol/L (ref 3.5–5.1)
Sodium: 134 mmol/L — ABNORMAL LOW (ref 135–145)
Total Bilirubin: 0.8 mg/dL (ref 0.3–1.2)
Total Protein: 6.5 g/dL (ref 6.5–8.1)

## 2021-05-15 LAB — POC URINE PREG, ED: Preg Test, Ur: NEGATIVE

## 2021-05-15 LAB — SARS CORONAVIRUS 2 (TAT 6-24 HRS): SARS Coronavirus 2: NEGATIVE

## 2021-05-15 LAB — CBG MONITORING, ED: Glucose-Capillary: 391 mg/dL — ABNORMAL HIGH (ref 70–99)

## 2021-05-15 NOTE — Discharge Instructions (Addendum)
Please follow up with PCP as soon as possible- it is very important you restart you treatment for Diabetes. Follow up in ED with any worsening glucose measurements at home or any further symptom development as elevated glucose levels can be life threatening. Will notify of results once available.

## 2021-05-15 NOTE — ED Provider Notes (Signed)
[facility]    CSN: [id number] Arrival date & time: 05/15/21  7062      History   Chief Complaint Chief Complaint  Patient presents with   Vaginal Discharge    HPI Monique Schmidt is a 35 y.o. female.   Patient here today for evaluation of itchy vaginal discharge that started recently.  She is a type II diabetic, and states she has not taken her medication over the last week.  She is trying to get a follow-up appointment with her PCP for refill.  She is not sure when she will be able to do this.  She would like STD screening as well.  She denies any abdominal pain.  She has not had any fever or chills.  She would also like COVID screening, her children are here today for evaluation of cough and sore throat.  Patient reports she has had mild cough but no other symptoms.  The history is provided by the patient.   Past Medical History:  Diagnosis Date   Abnormal Pap smear    Bacterial vaginosis    Gestational diabetes    likely pre-exisiting; all pregnancies glyburide started 11/4   Gestational thrombocytopenia (HCC)    Headache(784.0)    Hernia, umbilical    History of abnormal cervical Pap smear    normal 2014   Mild preeclampsia delivered 01/22/2016   Type 2 diabetes mellitus [facility])     Patient Active Problem List   Diagnosis Date Noted   Acute respiratory failure with hypoxia (HCC)    COVID-19 03/31/2020   Cervical high risk HPV (human papillomavirus) test positive 06/29/2019   History of gestational diabetes 06/26/2019   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   BMI 50.0-59.9, adult (HCC) 11/09/2013   Moderate persistent asthma 08/07/2013   Diabetes mellitus (HCC) 04/16/2011    Past Surgical History:  Procedure Laterality Date   UMBILICAL HERNIA REPAIR N/A 11/22/2014   Procedure: LAPAROSCOPIC UMBILICAL HERNIA;  Surgeon: Axel Filler, MD;  [facility]: [facility];  Service: General;  Laterality: N/A;   WISDOM TOOTH EXTRACTION      OB History     Gravida  6    Para  5   Term  5   Preterm      AB  1   Living  5      SAB  1   IAB      Ectopic      Multiple  0   Live Births  5            Home Medications    Prior to Admission medications   Medication Sig Start Date End Date Taking? Authorizing Provider  hydrocortisone cream 1 % Apply to affected area 2 times daily 04/12/21   Ivette Loyal, NP  hydrOXYzine (ATARAX/VISTARIL) 25 MG tablet Take 1 tablet (25 mg total) by mouth every 8 (eight) hours as needed. 04/21/21   Merrilee Jansky, MD  terbinafine (LAMISIL) 250 MG tablet Take 1 tablet (250 mg total) by mouth daily. 04/21/21   Merrilee Jansky, MD  medroxyPROGESTERone (PROVERA) 10 MG tablet Take 1 tablet (10 mg total) by mouth daily. 06/24/19 08/18/19  Reva Bores, MD    Family History Family History  Problem Relation Age of Onset   Hypertension Father    Diabetes Father    Diabetes Mother    Seizures Brother     Social History Social History   Tobacco Use   Smoking status: Never   Smokeless tobacco:  Never  Vaping Use   Vaping Use: Never used  Substance Use Topics   Alcohol use: No   Drug use: No     Allergies   Latex and Penicillins   Review of Systems Review of Systems  Constitutional:  Negative for chills and fever.  HENT:  Negative for congestion, ear pain, sinus pressure and sore throat.   Eyes:  Negative for discharge and redness.  Respiratory:  Positive for cough. Negative for shortness of breath and wheezing.   Gastrointestinal:  Negative for abdominal pain, diarrhea, nausea and vomiting.  Genitourinary:  Positive for vaginal discharge.    Physical Exam Triage Vital Signs ED Triage Vitals  Enc Vitals Group     BP 05/15/21 0932 (!) 131/92     Pulse Rate 05/15/21 0932 75     Resp 05/15/21 0925 20     Temp 05/15/21 0925 98.3 F (36.8 C)     Temp Source 05/15/21 0932 Oral     SpO2 05/15/21 0932 100 %     Weight --      Height --      Head Circumference --      Peak Flow --       Pain Score 05/15/21 0926 0     Pain Loc --      Pain Edu? --      Excl. in GC? --    No data found.  Updated Vital Signs BP (!) 131/92 (BP Location: Right Wrist)   Pulse 75   Temp 98.3 F (36.8 C) (Oral)   Resp 18   LMP 02/17/2021 (Approximate)   SpO2 100%      Physical Exam Vitals and nursing note reviewed.  Constitutional:      General: She is not in acute distress.    Appearance: Normal appearance. She is not ill-appearing.  HENT:     Head: Normocephalic and atraumatic.     Nose: No congestion.  Eyes:     Conjunctiva/sclera: Conjunctivae normal.  Cardiovascular:     Rate and Rhythm: Normal rate and regular rhythm.     Heart sounds: Normal heart sounds. No murmur heard. Pulmonary:     Effort: Pulmonary effort is normal. No respiratory distress.     Breath sounds: Normal breath sounds. No wheezing, rhonchi or rales.  Skin:    General: Skin is warm and dry.  Neurological:     Mental Status: She is alert.  Psychiatric:        Mood and Affect: Mood normal.        Thought Content: Thought content normal.     UC Treatments / Results  Labs (all labs ordered are listed, but only abnormal results are displayed) Labs Reviewed  POCT URINALYSIS DIPSTICK, ED / UC - Abnormal; Notable for the following components:      Result Value   Glucose, UA >=1000 (*)    Ketones, ur TRACE (*)    All other components within normal limits  CBG MONITORING, ED - Abnormal; Notable for the following components:   Glucose-Capillary 391 (*)    All other components within normal limits  SARS CORONAVIRUS 2 (TAT 6-24 HRS)  COMPREHENSIVE METABOLIC PANEL  POC URINE PREG, ED  CERVICOVAGINAL ANCILLARY ONLY    EKG   Radiology No results found.  Procedures Procedures (including critical care time)  Medications Ordered in UC Medications - No data to display  Initial Impression / Assessment and Plan / UC Course  I have reviewed the triage vital signs  and the nursing notes.  Pertinent  labs & imaging results that were available during my care of the patient were reviewed by me and considered in my medical decision making (see chart for details).  Strongly recommended she follow-up with her PCP as soon as possible to resume her treatment for diabetes.  Discussed concern with elevated blood sugar in office, and ketones in urine.  Will order screening for BV, yeast, STDs as requested.  COVID screening also ordered.  Recommended evaluation in the emergency department with any issues with her glucose, or further development of symptoms.  Final Clinical Impressions(s) / UC Diagnoses   Final diagnoses:  Vaginal itching  Vaginal discharge  Uncontrolled type 2 diabetes mellitus with hyperglycemia (HCC)  Acute upper respiratory infection     Discharge Instructions      Please follow up with PCP as soon as possible- it is very important you restart you treatment for Diabetes. Follow up in ED with any worsening glucose measurements at home or any further symptom development as elevated glucose levels can be life threatening. Will notify of results once available.      ED Prescriptions   None    PDMP not reviewed this encounter.   Tomi Bamberger, PA-C 05/15/21 1134

## 2021-05-15 NOTE — ED Triage Notes (Signed)
Pt reports she has a vaginal yeast infection. Pt also request a COVID test.

## 2021-05-16 LAB — CERVICOVAGINAL ANCILLARY ONLY
Bacterial Vaginitis (gardnerella): NEGATIVE
Candida Glabrata: NEGATIVE
Candida Vaginitis: POSITIVE — AB
Chlamydia: NEGATIVE
Comment: NEGATIVE
Comment: NEGATIVE
Comment: NEGATIVE
Comment: NEGATIVE
Comment: NEGATIVE
Comment: NORMAL
Neisseria Gonorrhea: NEGATIVE
Trichomonas: NEGATIVE

## 2021-05-17 ENCOUNTER — Telehealth (HOSPITAL_COMMUNITY): Payer: Self-pay | Admitting: Emergency Medicine

## 2021-05-17 DIAGNOSIS — Z1231 Encounter for screening mammogram for malignant neoplasm of breast: Secondary | ICD-10-CM

## 2021-05-17 DIAGNOSIS — F1299 Cannabis use, unspecified with unspecified cannabis-induced disorder: Secondary | ICD-10-CM | POA: Diagnosis not present

## 2021-05-17 MED ORDER — FLUCONAZOLE 150 MG PO TABS: 150.0000 mg | ORAL_TABLET | Freq: Once | ORAL | 0 refills | Status: AC

## 2021-05-22 DIAGNOSIS — F1299 Cannabis use, unspecified with unspecified cannabis-induced disorder: Secondary | ICD-10-CM | POA: Diagnosis not present

## 2021-06-10 ENCOUNTER — Ambulatory Visit (HOSPITAL_COMMUNITY)
Admission: EM | Admit: 2021-06-10 | Discharge: 2021-06-10 | Disposition: A | Discharge status: Home / Self Care | Payer: Medicaid Other | Attending: Family Medicine | Admitting: Family Medicine

## 2021-06-10 ENCOUNTER — Encounter (HOSPITAL_COMMUNITY): Payer: Self-pay

## 2021-06-10 ENCOUNTER — Other Ambulatory Visit: Payer: Self-pay

## 2021-06-10 DIAGNOSIS — N760 Acute vaginitis: Secondary | ICD-10-CM

## 2021-06-10 DIAGNOSIS — Z8619 Personal history of other infectious and parasitic diseases: Secondary | ICD-10-CM | POA: Insufficient documentation

## 2021-06-10 DIAGNOSIS — R051 Acute cough: Secondary | ICD-10-CM | POA: Diagnosis not present

## 2021-06-10 DIAGNOSIS — Z880 Allergy status to penicillin: Secondary | ICD-10-CM | POA: Diagnosis not present

## 2021-06-10 DIAGNOSIS — Z20822 Contact with and (suspected) exposure to covid-19: Secondary | ICD-10-CM | POA: Insufficient documentation

## 2021-06-10 DIAGNOSIS — Z8616 Personal history of COVID-19: Secondary | ICD-10-CM | POA: Insufficient documentation

## 2021-06-10 MED ORDER — FLUCONAZOLE 150 MG PO TABS: 150.0000 mg | ORAL_TABLET | ORAL | 0 refills | Status: DC

## 2021-06-10 NOTE — ED Triage Notes (Signed)
Pt reports she wants to test for vaginal yeats infection and COVID test.

## 2021-06-10 NOTE — ED Provider Notes (Signed)
[facility]    CSN: [id number] Arrival date & time: 06/10/21  1453      History   Chief Complaint Chief Complaint  Patient presents with   Vaginal Discharge    HPI Monique Schmidt is a 35 y.o. female.   Presenting today with cough that started today. Denies significant congestion, sore throat, CP, SOB, abdominal pain, N/V/D. Vaginal discharge since yesterday with mild itching. No new sexual partners or significant concern for STI. Hx of DM 2 and yeast infections. Not trying anything OTC for sxs.     Past Medical History:  Diagnosis Date   Abnormal Pap smear    Bacterial vaginosis    Gestational diabetes    likely pre-exisiting; all pregnancies glyburide started 11/4   Gestational thrombocytopenia (HCC)    Headache(784.0)    Hernia, umbilical    History of abnormal cervical Pap smear    normal 2014   Mild preeclampsia delivered 01/22/2016   Type 2 diabetes mellitus [facility])     Patient Active Problem List   Diagnosis Date Noted   Acute respiratory failure with hypoxia (HCC)    COVID-19 03/31/2020   Cervical high risk HPV (human papillomavirus) test positive 06/29/2019   History of gestational diabetes 06/26/2019   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   BMI 50.0-59.9, adult (HCC) 11/09/2013   Moderate persistent asthma 08/07/2013   Diabetes mellitus (HCC) 04/16/2011    Past Surgical History:  Procedure Laterality Date   UMBILICAL HERNIA REPAIR N/A 11/22/2014   Procedure: LAPAROSCOPIC UMBILICAL HERNIA;  Surgeon: Axel Filler, MD;  Location[facility];  Service: General;  Laterality: N/A;   WISDOM TOOTH EXTRACTION      OB History     Gravida  6   Para  5   Term  5   Preterm      AB  1   Living  5      SAB  1   IAB      Ectopic      Multiple  0   Live Births  5            Home Medications    Prior to Admission medications   Medication Sig Start Date End Date Taking? Authorizing Provider  fluconazole (DIFLUCAN) 150 MG tablet Take 1  tablet (150 mg total) by mouth every other day. 06/10/21  Yes Particia Nearing, PA-C  hydrocortisone cream 1 % Apply to affected area 2 times daily 04/12/21   Ivette Loyal, NP  hydrOXYzine (ATARAX/VISTARIL) 25 MG tablet Take 1 tablet (25 mg total) by mouth every 8 (eight) hours as needed. 04/21/21   Merrilee Jansky, MD  terbinafine (LAMISIL) 250 MG tablet Take 1 tablet (250 mg total) by mouth daily. 04/21/21   Merrilee Jansky, MD  medroxyPROGESTERone (PROVERA) 10 MG tablet Take 1 tablet (10 mg total) by mouth daily. 06/24/19 08/18/19  Reva Bores, MD    Family History Family History  Problem Relation Age of Onset   Hypertension Father    Diabetes Father    Diabetes Mother    Seizures Brother     Social History Social History   Tobacco Use   Smoking status: Never   Smokeless tobacco: Never  Vaping Use   Vaping Use: Never used  Substance Use Topics   Alcohol use: No   Drug use: No     Allergies   Latex and Penicillins   Review of Systems Review of Systems PER HPI   Physical Exam  Triage Vital Signs ED Triage Vitals  Enc Vitals Group     BP 06/10/21 1711 (!) 127/98     Pulse Rate 06/10/21 1711 85     Resp 06/10/21 1711 20     Temp 06/10/21 1711 99 F (37.2 C)     Temp Source 06/10/21 1711 Oral     SpO2 06/10/21 1711 98 %     Weight --      Height --      Head Circumference --      Peak Flow --      Pain Score 06/10/21 1715 0     Pain Loc --      Pain Edu? --      Excl. in GC? --    No data found.  Updated Vital Signs BP (!) 127/98 (BP Location: Left Arm)   Pulse 85   Temp 99 F (37.2 C) (Oral)   Resp 20   SpO2 98%   Visual Acuity Right Eye Distance:   Left Eye Distance:   Bilateral Distance:    Right Eye Near:   Left Eye Near:    Bilateral Near:     Physical Exam Vitals and nursing note reviewed.  Constitutional:      Appearance: Normal appearance. She is not ill-appearing.  HENT:     Head: Atraumatic.     Right Ear: Tympanic  membrane normal.     Left Ear: Tympanic membrane normal.     Nose: Nose normal.     Mouth/Throat:     Mouth: Mucous membranes are moist.     Pharynx: Oropharynx is clear.  Eyes:     Extraocular Movements: Extraocular movements intact.     Conjunctiva/sclera: Conjunctivae normal.  Cardiovascular:     Rate and Rhythm: Normal rate and regular rhythm.     Heart sounds: Normal heart sounds.  Pulmonary:     Effort: Pulmonary effort is normal. No respiratory distress.     Breath sounds: Normal breath sounds. No wheezing or rales.  Abdominal:     General: Bowel sounds are normal. There is no distension.     Palpations: Abdomen is soft.     Tenderness: There is no abdominal tenderness. There is no guarding.  Genitourinary:    Comments: GU exam deferred Musculoskeletal:        General: Normal range of motion.     Cervical back: Normal range of motion and neck supple.  Skin:    General: Skin is warm and dry.  Neurological:     Mental Status: She is alert and oriented to person, place, and time.  Psychiatric:        Mood and Affect: Mood normal.        Thought Content: Thought content normal.        Judgment: Judgment normal.     UC Treatments / Results  Labs (all labs ordered are listed, but only abnormal results are displayed) Labs Reviewed  SARS CORONAVIRUS 2 (TAT 6-24 HRS)  CERVICOVAGINAL ANCILLARY ONLY    EKG   Radiology No results found.  Procedures Procedures (including critical care time)  Medications Ordered in UC Medications - No data to display  Initial Impression / Assessment and Plan / UC Course  I have reviewed the triage vital signs and the nursing notes.  Pertinent labs & imaging results that were available during my care of the patient were reviewed by me and considered in my medical decision making (see chart for details).  Exam and vitals reassuring. COVID pcr pending per her request due to cough and childrens' viral sxs, vaginal swab also  pending. Trial diflucan in meantime in case yeast vaginitis. Discussed OTC cough and congestion medications and return precautions.   Final Clinical Impressions(s) / UC Diagnoses   Final diagnoses:  Acute vaginitis  Acute cough   Discharge Instructions   None    ED Prescriptions     Medication Sig Dispense Auth. Provider   fluconazole (DIFLUCAN) 150 MG tablet Take 1 tablet (150 mg total) by mouth every other day. 5 tablet Particia Nearing, [place]      PDMP not reviewed this encounter.   Roosvelt Maser [place], [place] 06/11/21 [phone]

## 2021-06-11 LAB — SARS CORONAVIRUS 2 (TAT 6-24 HRS): SARS Coronavirus 2: NEGATIVE

## 2021-06-12 LAB — CERVICOVAGINAL ANCILLARY ONLY
Bacterial Vaginitis (gardnerella): NEGATIVE
Candida Glabrata: NEGATIVE
Candida Vaginitis: NEGATIVE
Chlamydia: NEGATIVE
Comment: NEGATIVE
Comment: NEGATIVE
Comment: NEGATIVE
Comment: NEGATIVE
Comment: NEGATIVE
Comment: NORMAL
Neisseria Gonorrhea: NEGATIVE
Trichomonas: NEGATIVE

## 2021-06-14 ENCOUNTER — Other Ambulatory Visit: Payer: Self-pay | Admitting: Family Medicine

## 2021-06-14 DIAGNOSIS — Z1231 Encounter for screening mammogram for malignant neoplasm of breast: Secondary | ICD-10-CM

## 2021-06-21 ENCOUNTER — Encounter: Payer: Medicaid Other | Admitting: Obstetrics and Gynecology

## 2021-07-19 ENCOUNTER — Ambulatory Visit: Payer: Medicaid Other

## 2021-07-25 ENCOUNTER — Ambulatory Visit (HOSPITAL_COMMUNITY)
Admission: EM | Admit: 2021-07-25 | Discharge: 2021-07-25 | Disposition: A | Discharge status: Home / Self Care | Payer: Medicaid Other | Attending: Physician Assistant | Admitting: Physician Assistant

## 2021-07-25 ENCOUNTER — Other Ambulatory Visit: Payer: Self-pay

## 2021-07-25 ENCOUNTER — Encounter (HOSPITAL_COMMUNITY): Payer: Self-pay

## 2021-07-25 DIAGNOSIS — E1165 Type 2 diabetes mellitus with hyperglycemia: Secondary | ICD-10-CM

## 2021-07-25 DIAGNOSIS — R739 Hyperglycemia, unspecified: Secondary | ICD-10-CM | POA: Diagnosis not present

## 2021-07-25 DIAGNOSIS — Z7984 Long term (current) use of oral hypoglycemic drugs: Secondary | ICD-10-CM | POA: Diagnosis not present

## 2021-07-25 DIAGNOSIS — M545 Low back pain, unspecified: Secondary | ICD-10-CM | POA: Diagnosis not present

## 2021-07-25 LAB — POCT URINALYSIS DIPSTICK, ED / UC
Bilirubin Urine: NEGATIVE
Glucose, UA: >=1000 mg/dL — AB
Hgb urine dipstick: NEGATIVE
Leukocytes,Ua: NEGATIVE
Nitrite: NEGATIVE
Protein, ur: NEGATIVE mg/dL
Specific Gravity, Urine: >=1.03 (ref 1.005–1.030)
Urobilinogen, UA: 0.2 mg/dL (ref 0.0–1.0)
pH: 5.5 (ref 5.0–8.0)

## 2021-07-25 LAB — CBG MONITORING, ED: Glucose-Capillary: 382 mg/dL — ABNORMAL HIGH (ref 70–99)

## 2021-07-25 LAB — POC URINE PREG, ED: Preg Test, Ur: NEGATIVE

## 2021-07-25 MED ORDER — METFORMIN HCL 500 MG PO TABS: 500.0000 mg | ORAL_TABLET | Freq: Two times a day (BID) | ORAL | 1 refills | Status: DC

## 2021-07-25 NOTE — ED Triage Notes (Signed)
Pt c/o back pain x 2 days

## 2021-07-25 NOTE — Discharge Instructions (Addendum)
Recommend Ibuprofen as needed Stretch and apply ice to affected area  It is very important that you follow up with PCP regarding diabetes

## 2021-07-25 NOTE — ED Provider Notes (Signed)
[facility]    CSN: [id number] Arrival date & time: 07/25/21  1950      History   Chief Complaint Chief Complaint  Patient presents with   Back Pain    HPI Monique Schmidt is a 35 y.o. female.   Pt complains of right lower back pain that started about two days ago.  Denies injury or trauma.  She has taken nothing for the pain.  She denies radiation of pain, urinary sx, numbness or tingling.    Past Medical History:  Diagnosis Date   Abnormal Pap smear    Bacterial vaginosis    Gestational diabetes    likely pre-exisiting; all pregnancies glyburide started 11/4   Gestational thrombocytopenia (HCC)    Headache(784.0)    Hernia, umbilical    History of abnormal cervical Pap smear    normal 2014   Mild preeclampsia delivered 01/22/2016   Type 2 diabetes mellitus [facility])     Patient Active Problem List   Diagnosis Date Noted   Acute respiratory failure with hypoxia ([place])    COVID-19 03/31/2020   Cervical high risk HPV (human papillomavirus) test positive 06/29/2019   History of gestational diabetes 06/26/2019   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   BMI 50.0-59.9, adult ([place]) 11/09/2013   Moderate persistent asthma 08/07/2013   Diabetes mellitus ([place]) 04/16/2011    Past Surgical History:  Procedure Laterality Date   UMBILICAL HERNIA REPAIR N/A 11/22/2014   Procedure: LAPAROSCOPIC UMBILICAL HERNIA;  Surgeon: Ralene Ok, MD;  Location: [place];  Service: General;  Laterality: N/A;   WISDOM TOOTH EXTRACTION      OB History     Gravida  6   Para  5   Term  5   Preterm      AB  1   Living  5      SAB  1   IAB      Ectopic      Multiple  0   Live Births  5            Home Medications    Prior to Admission medications   Medication Sig Start Date End Date Taking? Authorizing Provider  metFORMIN (GLUCOPHAGE) 500 MG tablet Take 1 tablet (500 mg total) by mouth 2 (two) times daily with a meal. 07/25/21 08/24/21 Yes Ward, Lenise Arena, PA-C   fluconazole (DIFLUCAN) 150 MG tablet Take 1 tablet (150 mg total) by mouth every other day. 06/10/21   Volney American, PA-C  hydrocortisone cream 1 % Apply to affected area 2 times daily 04/12/21   Pearson Forster, NP  hydrOXYzine (ATARAX/VISTARIL) 25 MG tablet Take 1 tablet (25 mg total) by mouth every 8 (eight) hours as needed. 04/21/21   Chase Picket, MD  terbinafine (LAMISIL) 250 MG tablet Take 1 tablet (250 mg total) by mouth daily. 04/21/21   Chase Picket, MD  medroxyPROGESTERone (PROVERA) 10 MG tablet Take 1 tablet (10 mg total) by mouth daily. 06/24/19 08/18/19  Donnamae Jude, MD    Family History Family History  Problem Relation Age of Onset   Hypertension Father    Diabetes Father    Diabetes Mother    Seizures Brother     Social History Social History   Tobacco Use   Smoking status: Never   Smokeless tobacco: Never  Vaping Use   Vaping Use: Never used  Substance Use Topics   Alcohol use: No   Drug use: No     Allergies  Latex and Penicillins   Review of Systems Review of Systems  Constitutional:  Negative for chills and fever.  HENT:  Negative for ear pain and sore throat.   Eyes:  Negative for pain and visual disturbance.  Respiratory:  Negative for cough and shortness of breath.   Cardiovascular:  Negative for chest pain and palpitations.  Gastrointestinal:  Negative for abdominal pain and vomiting.  Genitourinary:  Negative for dysuria and hematuria.  Musculoskeletal:  Positive for back pain. Negative for arthralgias.  Skin:  Negative for color change and rash.  Neurological:  Negative for seizures and syncope.  All other systems reviewed and are negative.   Physical Exam Triage Vital Signs ED Triage Vitals  Enc Vitals Group     BP 07/25/21 2001 123/88     Pulse Rate 07/25/21 2001 83     Resp 07/25/21 2001 18     Temp 07/25/21 2001 99 F (37.2 C)     Temp Source 07/25/21 2001 Oral     SpO2 07/25/21 2001 97 %     Weight 07/25/21  1959 (!) 305 lb (138.3 kg)     Height 07/25/21 1959 5\' 6"  (1.676 m)     Head Circumference --      Peak Flow --      Pain Score 07/25/21 1959 8     Pain Loc --      Pain Edu? --      Excl. in GC? --    No data found.  Updated Vital Signs BP 123/88 (BP Location: Left Wrist)   Pulse 82   Temp 99 F (37.2 C) (Oral)   Resp 19   Ht 5\' 6"  (1.676 m)   Wt (!) 305 lb (138.3 kg)   LMP 05/20/2021   SpO2 96%   BMI 49.23 kg/m   Visual Acuity Right Eye Distance:   Left Eye Distance:   Bilateral Distance:    Right Eye Near:   Left Eye Near:    Bilateral Near:     Physical Exam Vitals and nursing note reviewed.  Constitutional:      General: She is not in acute distress.    Appearance: She is well-developed.  HENT:     Head: Normocephalic and atraumatic.  Eyes:     Conjunctiva/sclera: Conjunctivae normal.  Cardiovascular:     Rate and Rhythm: Normal rate and regular rhythm.     Heart sounds: No murmur heard. Pulmonary:     Effort: Pulmonary effort is normal. No respiratory distress.     Breath sounds: Normal breath sounds.  Abdominal:     Palpations: Abdomen is soft.     Tenderness: There is no abdominal tenderness.  Musculoskeletal:        General: No swelling.     Cervical back: Neck supple.  Skin:    General: Skin is warm and dry.     Capillary Refill: Capillary refill takes less than 2 seconds.  Neurological:     Mental Status: She is alert.  Psychiatric:        Mood and Affect: Mood normal.     UC Treatments / Results  Labs (all labs ordered are listed, but only abnormal results are displayed) Labs Reviewed  POCT URINALYSIS DIPSTICK, ED / UC - Abnormal; Notable for the following components:      Result Value   Glucose, UA >=1000 (*)    Ketones, ur TRACE (*)    All other components within normal limits  CBG MONITORING, ED -  Abnormal; Notable for the following components:   Glucose-Capillary 382 (*)    All other components within normal limits  POC URINE  PREG, ED    EKG   Radiology No results found.  Procedures Procedures (including critical care time)  Medications Ordered in UC Medications - No data to display  Initial Impression / Assessment and Plan / UC Course  I have reviewed the triage vital signs and the nursing notes.  Pertinent labs & imaging results that were available during my care of the patient were reviewed by me and considered in my medical decision making (see chart for details).     Right lower back pain, UA negative, pregnancy negative.  Likely musculoskeletal, negative SLR.  Advised Ibuprofen as needed, stretching, ice to affected area.    Uncontrolled diabetes.  Glucose >1000 on UA, ketones present.  CBG 382.  Similar labs at last visit 3 months ago.  She has not followed up with PCP.  Pt reports she was on metformin in the past, will restart today.  Advised to follow up with PCP.   Final Clinical Impressions(s) / UC Diagnoses   Final diagnoses:  Acute right-sided low back pain without sciatica  Hyperglycemia     Discharge Instructions      Recommend Ibuprofen as needed Stretch and apply ice to affected area  It is very important that you follow up with PCP regarding diabetes      ED Prescriptions     Medication Sig Dispense Auth. Provider   metFORMIN (GLUCOPHAGE) 500 MG tablet Take 1 tablet (500 mg total) by mouth 2 (two) times daily with a meal. 60 tablet Ward, Lenise Arena, PA-C      PDMP not reviewed this encounter.   Ward, Lenise Arena, PA-C 07/25/21 2035

## 2021-08-15 ENCOUNTER — Institutional Professional Consult (permissible substitution): Payer: Medicaid Other | Admitting: Plastic Surgery

## 2021-10-20 ENCOUNTER — Encounter: Payer: Medicaid Other | Admitting: Family Medicine

## 2021-10-20 ENCOUNTER — Telehealth: Payer: Self-pay

## 2021-10-20 ENCOUNTER — Encounter: Payer: Self-pay | Admitting: Family Medicine

## 2021-10-20 NOTE — Progress Notes (Signed)
Patient did not keep appointment today. She may call to reschedule.  

## 2021-10-20 NOTE — Telephone Encounter (Signed)
Called pt to follow up on missed appt; VM left. MyChart message sent. °

## 2021-11-13 DIAGNOSIS — R1084 Generalized abdominal pain: Secondary | ICD-10-CM | POA: Diagnosis not present

## 2021-11-13 DIAGNOSIS — R300 Dysuria: Secondary | ICD-10-CM | POA: Diagnosis not present

## 2021-11-13 DIAGNOSIS — Z20822 Contact with and (suspected) exposure to covid-19: Secondary | ICD-10-CM | POA: Diagnosis not present

## 2021-11-13 DIAGNOSIS — M545 Low back pain, unspecified: Secondary | ICD-10-CM | POA: Diagnosis not present

## 2021-11-13 DIAGNOSIS — R0981 Nasal congestion: Secondary | ICD-10-CM | POA: Diagnosis not present

## 2021-11-20 ENCOUNTER — Ambulatory Visit (HOSPITAL_COMMUNITY)
Admission: EM | Admit: 2021-11-20 | Discharge: 2021-11-20 | Disposition: A | Discharge status: Home / Self Care | Payer: Medicaid Other | Attending: Nurse Practitioner | Admitting: Nurse Practitioner

## 2021-11-20 ENCOUNTER — Encounter (HOSPITAL_COMMUNITY): Payer: Self-pay

## 2021-11-20 DIAGNOSIS — R109 Unspecified abdominal pain: Secondary | ICD-10-CM | POA: Diagnosis present

## 2021-11-20 DIAGNOSIS — J069 Acute upper respiratory infection, unspecified: Secondary | ICD-10-CM | POA: Diagnosis not present

## 2021-11-20 DIAGNOSIS — J029 Acute pharyngitis, unspecified: Secondary | ICD-10-CM | POA: Diagnosis not present

## 2021-11-20 DIAGNOSIS — Z20822 Contact with and (suspected) exposure to covid-19: Secondary | ICD-10-CM | POA: Diagnosis not present

## 2021-11-20 DIAGNOSIS — H9209 Otalgia, unspecified ear: Secondary | ICD-10-CM | POA: Insufficient documentation

## 2021-11-20 NOTE — ED Provider Notes (Signed)
?[facility] ? ? ? ?CSN: [id number] ?Arrival date & time: 11/20/21  1853 ? ? ?  ? ?History   ?Chief Complaint ?Chief Complaint  ?Patient presents with  ? Headache  ? Nasal Congestion  ? Abdominal Pain  ? Ear Pain  ? ? ?HPI ?Monique Schmidt is a 36 y.o. female.  ? ?Patient presents with a few day history of body aches, ear pain, congestion, nausea without vomiting.  Patient reports he is eating and drinking normally.  She is coughing some and does have a slight sore throat.  She denies fevers, body aches, chills, vomiting, shortness of breath, chest pain. ? ?She has not taken anything for her symptoms ? ?Patient's children are also sick right now ? ? ?Past Medical History:  ?Diagnosis Date  ? Abnormal Pap smear   ? Bacterial vaginosis   ? Gestational diabetes   ? likely pre-exisiting; all pregnancies glyburide started 11/4  ? Gestational thrombocytopenia (HCC)   ? Headache(784.0)   ? Hernia, umbilical   ? History of abnormal cervical Pap smear   ? normal 2014  ? Mild preeclampsia delivered 01/22/2016  ? Type 2 diabetes mellitus (HCC)   ? ? ?Patient Active Problem List  ? Diagnosis Date Noted  ? Acute respiratory failure with hypoxia (HCC)   ? COVID-19 03/31/2020  ? Cervical high risk HPV (human papillomavirus) test positive 06/29/2019  ? History of gestational diabetes 06/26/2019  ? 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  ? BMI 50.0-59.9, adult (HCC) 11/09/2013  ? Moderate persistent asthma 08/07/2013  ? Diabetes mellitus (HCC) 04/16/2011  ? ? ?Past Surgical History:  ?Procedure Laterality Date  ? UMBILICAL HERNIA REPAIR N/A 11/22/2014  ? Procedure: LAPAROSCOPIC UMBILICAL HERNIA;  Surgeon: Axel Filler, MD;  Location: [facility];  Service: General;  Laterality: N/A;  ? WISDOM TOOTH EXTRACTION    ? ? ?OB History   ? ? Gravida  ?6  ? Para  ?5  ? Term  ?5  ? Preterm  ?   ? AB  ?1  ? Living  ?5  ?  ? ? SAB  ?1  ? IAB  ?   ? Ectopic  ?   ? Multiple  ?0  ? Live Births  ?5  ?   ?  ?  ? ? ? ?Home Medications   ? ?Prior to  Admission medications   ?Medication Sig Start Date End Date Taking? Authorizing Provider  ?fluconazole (DIFLUCAN) 150 MG tablet Take 1 tablet (150 mg total) by mouth every other day. 06/10/21   Particia Nearing, PA-C  ?hydrocortisone cream 1 % Apply to affected area 2 times daily 04/12/21   Ivette Loyal, NP  ?hydrOXYzine (ATARAX/VISTARIL) 25 MG tablet Take 1 tablet (25 mg total) by mouth every 8 (eight) hours as needed. 04/21/21   Merrilee Jansky, MD  ?metFORMIN (GLUCOPHAGE) 500 MG tablet Take 1 tablet (500 mg total) by mouth 2 (two) times daily with a meal. 07/25/21 08/24/21  Ward, Tylene Fantasia, PA-C  ?terbinafine (LAMISIL) 250 MG tablet Take 1 tablet (250 mg total) by mouth daily. 04/21/21   Merrilee Jansky, MD  ?medroxyPROGESTERone (PROVERA) 10 MG tablet Take 1 tablet (10 mg total) by mouth daily. 06/24/19 08/18/19  Reva Bores, MD  ? ? ?Family History ?Family History  ?Problem Relation Age of Onset  ? Hypertension Father   ? Diabetes Father   ? Diabetes Mother   ? Seizures Brother   ? ? ?Social History ?Social History  ? ?Tobacco  Use  ? Smoking status: Never  ? Smokeless tobacco: Never  ?Vaping Use  ? Vaping Use: Never used  ?Substance Use Topics  ? Alcohol use: No  ? Drug use: No  ? ? ? ?Allergies   ?Latex and Penicillins ? ? ?Review of Systems ?Review of Systems ?Per HPI ? ?Physical Exam ?Triage Vital Signs ?ED Triage Vitals  ?Enc Vitals Group  ?   BP 11/20/21 2014 119/84  ?   Pulse Rate 11/20/21 1958 89  ?   Resp 11/20/21 1958 17  ?   Temp 11/20/21 1958 98 ?F (36.7 ?C)  ?   Temp Source 11/20/21 1958 Oral  ?   SpO2 11/20/21 1958 97 %  ?   Weight --   ?   Height --   ?   Head Circumference --   ?   Peak Flow --   ?   Pain Score 11/20/21 1957 3  ?   Pain Loc --   ?   Pain Edu? --   ?   Excl. in GC? --   ? ?No data found. ? ?Updated Vital Signs ?BP 119/84 (BP Location: Right Arm)   Pulse 89   Temp 98 ?F (36.7 ?C) (Oral)   Resp 17   LMP 09/29/2021 (Approximate)   SpO2 97%  ? ?Visual Acuity ?Right Eye  Distance:   ?Left Eye Distance:   ?Bilateral Distance:   ? ?Right Eye Near:   ?Left Eye Near:    ?Bilateral Near:    ? ?Physical Exam ?Vitals and nursing note reviewed.  ?Constitutional:   ?   General: She is not in acute distress. ?   Appearance: Normal appearance. She is not ill-appearing or toxic-appearing.  ?HENT:  ?   Head: Normocephalic and atraumatic.  ?   Right Ear: Tympanic membrane, ear canal and external ear normal.  ?   Left Ear: Tympanic membrane, ear canal and external ear normal.  ?   Nose: Congestion present. No rhinorrhea.  ?   Mouth/Throat:  ?   Mouth: Mucous membranes are moist.  ?   Pharynx: Oropharynx is clear. No oropharyngeal exudate or posterior oropharyngeal erythema.  ?Eyes:  ?   General: No scleral icterus. ?   Extraocular Movements: Extraocular movements intact.  ?Cardiovascular:  ?   Rate and Rhythm: Normal rate and regular rhythm.  ?Pulmonary:  ?   Effort: Pulmonary effort is normal. No respiratory distress.  ?   Breath sounds: Normal breath sounds. No wheezing, rhonchi or rales.  ?Musculoskeletal:  ?   Cervical back: Normal range of motion and neck supple.  ?Lymphadenopathy:  ?   Cervical: Cervical adenopathy present.  ?Skin: ?   General: Skin is warm and dry.  ?   Coloration: Skin is not jaundiced or pale.  ?   Findings: No erythema or rash.  ?Neurological:  ?   Mental Status: She is alert and oriented to person, place, and time.  ? ? ? ?UC Treatments / Results  ?Labs ?(all labs ordered are listed, but only abnormal results are displayed) ?Labs Reviewed  ?SARS CORONAVIRUS 2 (TAT 6-24 HRS)  ? ? ?EKG ? ? ?Radiology ?No results found. ? ?Procedures ?Procedures (including critical care time) ? ?Medications Ordered in UC ?Medications - No data to display ? ?Initial Impression / Assessment and Plan / UC Course  ?I have reviewed the triage vital signs and the nursing notes. ? ?Pertinent labs & imaging results that were available during my care of the patient  were reviewed by me and  considered in my medical decision making (see chart for details). ? ?  ?Symptoms are consistent with viral upper respiratory infection.  COVID-19 testing obtained.  Continue supportive care. Increase fluid intake with water or electrolyte solution like pedialyte. Encouraged acetaminophen as needed for fever/pain. Encouraged salt water gargling, chloraseptic spray and throat lozenges. Encouraged OTC guaifenesin. Encouraged saline sinus flushes and/or neti with humidified air.  Symptoms may last 7 to 10 days before improvement.  If symptoms worsen in the meantime or if they persist longer than 7 to 10 days without improvement, seek care. ? ?Final Clinical Impressions(s) / UC Diagnoses  ? ?Final diagnoses:  ?Viral URI  ? ? ? ?Discharge Instructions   ? ?  ?- We will let you know if your COVID-19 results are positive  ? ?Your symptoms and exam findings are most consistent with a viral upper respiratory infection. These usually run their course in 7-10 days. Some things that can make you feel better are: ?- Increased rest ?- Increasing fluid with water/sugar free electrolytes ?- Acetaminophen as needed for fever/pain.  ?- Salt water gargling, chloraseptic spray and throat lozenges ?- OTC guaifenesin (Mucinex).  ?- Saline sinus flushes or a neti pot.  ?- Humidifying the air. ? ?Please seek care if your symptoms worsen in the meantime. ? ? ? ? ?ED Prescriptions   ?None ?  ? ?PDMP not reviewed this encounter. ?  ?Valentino NoseMartinez, Yajaira Doffing A, NP ?11/20/21 2126 ? ?

## 2021-11-20 NOTE — Discharge Instructions (Addendum)
-   We will let you know if your COVID-19 results are positive  ? ?Your symptoms and exam findings are most consistent with a viral upper respiratory infection. These usually run their course in 7-10 days. Some things that can make you feel better are: ?- Increased rest ?- Increasing fluid with water/sugar free electrolytes ?- Acetaminophen as needed for fever/pain.  ?- Salt water gargling, chloraseptic spray and throat lozenges ?- OTC guaifenesin (Mucinex).  ?- Saline sinus flushes or a neti pot.  ?- Humidifying the air. ? ?Please seek care if your symptoms worsen in the meantime. ? ?

## 2021-11-20 NOTE — ED Triage Notes (Signed)
Pt presents with c/o runny nose, abdominal pain and headaches x 4 days.  ? ?Pt states she has taken Ibuprofen.  ?

## 2021-11-21 LAB — SARS CORONAVIRUS 2 (TAT 6-24 HRS): SARS Coronavirus 2: NEGATIVE

## 2022-01-08 ENCOUNTER — Other Ambulatory Visit (HOSPITAL_COMMUNITY)
Admission: RE | Admit: 2022-01-08 | Discharge: 2022-01-08 | Disposition: A | Discharge status: Home / Self Care | Payer: Medicaid Other | Source: Ambulatory Visit | Attending: Obstetrics and Gynecology | Admitting: Obstetrics and Gynecology

## 2022-01-08 ENCOUNTER — Encounter: Payer: Self-pay | Admitting: Obstetrics and Gynecology

## 2022-01-08 ENCOUNTER — Ambulatory Visit (INDEPENDENT_AMBULATORY_CARE_PROVIDER_SITE_OTHER): Payer: Medicaid Other | Admitting: Obstetrics and Gynecology

## 2022-01-08 VITALS — BP 125/88 | Temp 77.0°F | Wt 309.0 lb

## 2022-01-08 DIAGNOSIS — E1165 Type 2 diabetes mellitus with hyperglycemia: Secondary | ICD-10-CM | POA: Diagnosis not present

## 2022-01-08 DIAGNOSIS — Z3169 Encounter for other general counseling and advice on procreation: Secondary | ICD-10-CM

## 2022-01-08 DIAGNOSIS — Z113 Encounter for screening for infections with a predominantly sexual mode of transmission: Secondary | ICD-10-CM | POA: Insufficient documentation

## 2022-01-08 DIAGNOSIS — Z01419 Encounter for gynecological examination (general) (routine) without abnormal findings: Secondary | ICD-10-CM

## 2022-01-08 DIAGNOSIS — Z6841 Body Mass Index (BMI) 40.0 and over, adult: Secondary | ICD-10-CM | POA: Diagnosis not present

## 2022-01-08 DIAGNOSIS — Z794 Long term (current) use of insulin: Secondary | ICD-10-CM | POA: Diagnosis not present

## 2022-01-08 DIAGNOSIS — Z0000 Encounter for general adult medical examination without abnormal findings: Secondary | ICD-10-CM

## 2022-01-08 NOTE — Patient Instructions (Addendum)
Call office Day 1 of next cycle for Day 3 FSH and Day 21 progesterone  Please call [facility] [phone] to establish patient care.

## 2022-01-08 NOTE — Progress Notes (Signed)
GYNECOLOGY ANNUAL PREVENTATIVE CARE ENCOUNTER NOTE  History:     Monique Schmidt is a 36 y.o. [phone] female here for a routine annual gynecologic exam.  Current complaints: infertility x 8 months.  Regular menses noted.  Spouse has not had a semen analysis.  Menses last 4-5 days.  LMP was 12/26/21.  Pt is a type 2 diabetic but has not been checking her blood sugar, does not know her A1c and only takes metformin for control.    Gynecologic History Patient's last menstrual period was 12/26/2021. Contraception: none Last Pap: 11/20. Results were: normal with positive HPV Last mammogram: pt scheduled for mammogram this month Obstetric History OB History  Gravida Para Term Preterm AB Living  6 5 5   1 5   SAB IAB Ectopic Multiple Live Births  1     0 5    # Outcome Date GA Lbr Len/2nd Weight Sex Delivery Anes PTL Lv  6 Term 01/20/16 6475w3d 00:55 / 00:08 9 lb 11.7 oz (4.414 kg) F Vag-Spont None  LIV     Birth Comments: none  5 Term 12/15/13 1770w0d 06:43 / 00:02 8 lb 13.8 oz (4.02 kg) F Vag-Spont None  LIV  4 Term 06/26/12 866w1d 21:07 / 00:01 7 lb 2 oz (3.232 kg) F Vag-Spont None  LIV  3 Term 03/08/11 6766w1d 01:27 / 00:02 8 lb 9 oz (3.884 kg) F Vag-Spont None  LIV     Birth Comments: none  2 Term 05/19/09 4737w0d   F Vag-Spont None N LIV  1 SAB             Past Medical History:  Diagnosis Date   Abnormal Pap smear    Bacterial vaginosis    Gestational diabetes    likely pre-exisiting; all pregnancies glyburide started 11/4   Gestational thrombocytopenia (HCC)    Headache(784.0)    Hernia, umbilical    History of abnormal cervical Pap smear    normal 2014   Mild preeclampsia delivered 01/22/2016   Type 2 diabetes mellitus (HCC)     Past Surgical History:  Procedure Laterality Date   UMBILICAL HERNIA REPAIR N/A 11/22/2014   Procedure: LAPAROSCOPIC UMBILICAL HERNIA;  Surgeon: Axel FillerArmando Ramirez, MD;  Location: [facility];  Service: General;  Laterality: N/A;   WISDOM TOOTH EXTRACTION       Current Outpatient Medications on File Prior to Visit  Medication Sig Dispense Refill   fluconazole (DIFLUCAN) 150 MG tablet Take 1 tablet (150 mg total) by mouth every other day. (Patient not taking: Reported on 01/08/2022) 5 tablet 0   hydrocortisone cream 1 % Apply to affected area 2 times daily (Patient not taking: Reported on 01/08/2022) 15 g 0   hydrOXYzine (ATARAX/VISTARIL) 25 MG tablet Take 1 tablet (25 mg total) by mouth every 8 (eight) hours as needed. (Patient not taking: Reported on 01/08/2022) 20 tablet 0   metFORMIN (GLUCOPHAGE) 500 MG tablet Take 1 tablet (500 mg total) by mouth 2 (two) times daily with a meal. 60 tablet 1   terbinafine (LAMISIL) 250 MG tablet Take 1 tablet (250 mg total) by mouth daily. (Patient not taking: Reported on 01/08/2022) 7 tablet 0   [DISCONTINUED] medroxyPROGESTERone (PROVERA) 10 MG tablet Take 1 tablet (10 mg total) by mouth daily. 10 tablet 3   No current facility-administered medications on file prior to visit.    Allergies  Allergen Reactions   Latex Itching and Rash   Penicillins Itching and Rash    Did it involve  swelling of the face/tongue/throat, SOB, or low BP? N Did it involve sudden or severe rash/hives, skin peeling, or any reaction on the inside of your mouth or nose? Y Did you need to seek medical attention at a hospital or doctor's office? No When did it last happen?  Several  Years Ago    If all above answers are "NO", may proceed with cephalosporin use.        Social History:  reports that she has never smoked. She has never used smokeless tobacco. She reports that she does not drink alcohol and does not use drugs.  Family History  Problem Relation Age of Onset   Hypertension Father    Diabetes Father    Diabetes Mother    Seizures Brother     The following portions of the patient's history were reviewed and updated as appropriate: allergies, current medications, past family history, past medical history, past social  history, past surgical history and problem list.  Review of Systems Pertinent items noted in HPI and remainder of comprehensive ROS otherwise negative.  Physical Exam:  BP 125/88   Temp (!) 77 F (25 C)   Wt (!) 309 lb (140.2 kg)   LMP 12/26/2021   BMI 49.87 kg/m  CONSTITUTIONAL: Well-developed, well-nourished female in no acute distress.  HENT:  Normocephalic, atraumatic, External right and left ear normal. Oropharynx is clear and moist EYES: Conjunctivae and EOM are normal.  NECK: Normal range of motion, supple, no masses.  Normal thyroid.  SKIN: Skin is warm and dry. No rash noted. Not diaphoretic. No erythema. No pallor. MUSCULOSKELETAL: Normal range of motion. No tenderness.  No cyanosis, clubbing, or edema.  2+ distal pulses. NEUROLOGIC: Alert and oriented to person, place, and time. Normal reflexes, muscle tone coordination.  PSYCHIATRIC: Normal mood and affect. Normal behavior. Normal judgment and thought content. CARDIOVASCULAR: Normal heart rate noted, regular rhythm RESPIRATORY: Clear to auscultation bilaterally. Effort and breath sounds normal, no problems with respiration noted. BREASTS: deferred ABDOMEN: Soft, no distention noted.  No tenderness, rebound or guarding. Large pannus noted PELVIC: Normal appearing external genitalia and urethral meatus; normal appearing vaginal mucosa and cervix. Excessive redundant tissue noted. No abnormal discharge noted.  Pap smear obtained.  Normal uterine size, no other palpable masses, no uterine or adnexal tenderness.  Exam suboptimal due to body habitus.  Performed in the presence of a chaperone.   Assessment and Plan:    1. Women's annual routine gynecological examination Normal annual exam - Cytology - PAP( [place])  2. Routine screening for STI (sexually transmitted infection) Per pt request - Cervicovaginal ancillary only( [place])  3. Encounter for infertility counseling Pt seen.  Discussed with patient that her  spouse at some point will need a semen analysis due to female factor.  She does have regular menses, but morbid obesity is a concern as well as her age.  Will check day 3 FSH and day 21 progesterone.  Strongly advised weight loss and better control of diabetes while trying to get pregnant.  Can consider  short trial of clomid ie 3 months , likely July .  At that point recommend referral to fertility specialist due to age and health considerations.  While waiting, did discuss timed intercourse.  Will follow up results of pap smear and manage accordingly. [facility] of her diabetes.  TSH and A1c ordered today for surveillance. Routine preventative health maintenance measures emphasized.  Please refer to After Visit Summary for other counseling recommendations.     Schedule follow up after initial labs Mariel Aloe, MD, FACOG Obstetrician & Gynecologist, [facility] for [employer], [facility]

## 2022-01-09 LAB — CERVICOVAGINAL ANCILLARY ONLY
Bacterial Vaginitis (gardnerella): POSITIVE — AB
Candida Glabrata: NEGATIVE
Candida Vaginitis: NEGATIVE
Chlamydia: NEGATIVE
Comment: NEGATIVE
Comment: NEGATIVE
Comment: NEGATIVE
Comment: NEGATIVE
Comment: NEGATIVE
Comment: NORMAL
Neisseria Gonorrhea: NEGATIVE
Trichomonas: NEGATIVE

## 2022-01-09 LAB — HEMOGLOBIN A1C
Est. average glucose Bld gHb Est-mCnc: 324 mg/dL
Hgb A1c MFr Bld: 12.9 % — ABNORMAL HIGH (ref 4.8–5.6)

## 2022-01-09 LAB — TSH+FREE T4
Free T4: 1.27 ng/dL (ref 0.82–1.77)
TSH: 1.68 u[IU]/mL (ref 0.450–4.500)

## 2022-01-10 ENCOUNTER — Encounter: Payer: Self-pay | Admitting: Obstetrics and Gynecology

## 2022-01-10 ENCOUNTER — Telehealth: Payer: Self-pay | Admitting: *Deleted

## 2022-01-10 ENCOUNTER — Other Ambulatory Visit: Payer: Self-pay | Admitting: Lactation Services

## 2022-01-10 MED ORDER — METRONIDAZOLE 500 MG PO TABS: 500.0000 mg | ORAL_TABLET | Freq: Two times a day (BID) | ORAL | 0 refills | Status: DC

## 2022-01-10 NOTE — Telephone Encounter (Signed)
Patient left a message on voicemail this am at 8:40 that she is calling about results. Per chart review has also sent MyChart messages re+BV that have been answered. I called Rashea and left message I was returning her call and do see she has also sent and received messages but if she has other questions may send more messages or call again. Nancy Fetter

## 2022-01-11 ENCOUNTER — Telehealth: Payer: Self-pay

## 2022-01-11 LAB — CYTOLOGY - PAP
Comment: NEGATIVE
Diagnosis: NEGATIVE
High risk HPV: NEGATIVE

## 2022-01-11 MED ORDER — ACCU-CHEK SOFTCLIX LANCETS MISC: 12 refills | Status: DC

## 2022-01-11 MED ORDER — GLUCOSE BLOOD VI STRP: ORAL_STRIP | 12 refills | Status: DC

## 2022-01-11 NOTE — Telephone Encounter (Signed)
-----   Message from Warden Fillers, MD sent at 01/09/2022  9:39 AM EDT ----- Hgb A1c is extremely elevated, need to expedite patient to internal medicine ASAP, thyroid is normal

## 2022-01-11 NOTE — Telephone Encounter (Signed)
Call placed to pt. Spoke with pt. Pt given results and recommendations per Dr Donavan Foil. Pt verbalized understanding. Referral to internal medicine placed by Dr Donavan Foil on 5/22. Instructed pt to let us know if she hasnt heard from them for an appt in 2 weeks. Pt agreeable. Pt states needs strips and lancets to start rechecking her blood sugar. Rx sent to pharmacy on file.  Laney Pastor

## 2022-01-17 ENCOUNTER — Ambulatory Visit: Payer: Medicaid Other

## 2022-01-29 ENCOUNTER — Other Ambulatory Visit: Payer: Medicaid Other

## 2022-01-29 DIAGNOSIS — Z3169 Encounter for other general counseling and advice on procreation: Secondary | ICD-10-CM | POA: Diagnosis not present

## 2022-01-30 LAB — FOLLICLE STIMULATING HORMONE: FSH: 3.5 m[IU]/mL

## 2022-02-05 ENCOUNTER — Telehealth: Payer: Self-pay | Admitting: Family Medicine

## 2022-02-05 NOTE — Telephone Encounter (Signed)
Called pt and pt informed me that she was to get something done however she did not know when.  Per chart review pt was to have a FSH drawn on day 3 and a progesterone drawn on day 21.  I advised pt that she will need a progesterone drawn in 18 days.  Pt verbalized understanding.   Leonette Nutting  02/05/22

## 2022-02-05 NOTE — Telephone Encounter (Signed)
Patient called in and she has some questions about the message that carrie sent her on 6/14.

## 2022-02-22 ENCOUNTER — Telehealth: Payer: Self-pay

## 2022-02-22 NOTE — Telephone Encounter (Signed)
Pt call transferred from the front office and pt wanted to know if she should be scheduling her progesterone level now.  Attempted to call pt and received message stating that "person I am trying to call does not have a voicemail box that is set up."  MyChart message sent.    Monique Schmidt  02/22/22

## 2022-02-23 ENCOUNTER — Ambulatory Visit
Admission: EM | Admit: 2022-02-23 | Discharge: 2022-02-23 | Disposition: A | Discharge status: Home / Self Care | Payer: Medicaid Other | Attending: Urgent Care | Admitting: Urgent Care

## 2022-02-23 ENCOUNTER — Encounter: Payer: Self-pay | Admitting: Emergency Medicine

## 2022-02-23 DIAGNOSIS — N898 Other specified noninflammatory disorders of vagina: Secondary | ICD-10-CM | POA: Diagnosis not present

## 2022-02-23 DIAGNOSIS — N760 Acute vaginitis: Secondary | ICD-10-CM | POA: Insufficient documentation

## 2022-02-23 LAB — POCT URINE PREGNANCY: Preg Test, Ur: NEGATIVE

## 2022-02-23 MED ORDER — FLUCONAZOLE 150 MG PO TABS: 150.0000 mg | ORAL_TABLET | ORAL | 0 refills | Status: DC

## 2022-02-23 NOTE — ED Triage Notes (Signed)
Pt here with vaginal discharge and itching x 2 days.

## 2022-02-23 NOTE — ED Provider Notes (Signed)
[facility]   MRN: [id number] DOB: 1985-08-30  Subjective:   Monique Schmidt is a 36 y.o. female presenting for 2 day history of recurrent vaginal discharge and vaginal itching.  Patient was treated for bacterial vaginosis 01/08/2022 with metronidazole.  She was treated for yeast infection October of last year. Has a history of diabetes treated without insulin.  LMP was 01/27/2022.  She is sexually active, is not opposed to a urine pregnancy test or STI testing.  Her primary concern is for recurrent yeast vaginitis as this is where it normally feels like for her.  No current facility-administered medications for this encounter.  Current Outpatient Medications:    Accu-Chek Softclix Lancets lancets, Use four times daily as instructed., Disp: 100 each, Rfl: 12   glucose blood test strip, Use as instructed, Disp: 100 each, Rfl: 12   metFORMIN (GLUCOPHAGE) 500 MG tablet, Take 1 tablet (500 mg total) by mouth 2 (two) times daily with a meal., Disp: 60 tablet, Rfl: 1   Allergies  Allergen Reactions   Latex Itching and Rash   Penicillins Itching and Rash    Did it involve swelling of the face/tongue/throat, SOB, or low BP? N Did it involve sudden or severe rash/hives, skin peeling, or any reaction on the inside of your mouth or nose? Y Did you need to seek medical attention at a hospital or doctor's office? No When did it last happen?  Several  Years Ago    If all above answers are "NO", may proceed with cephalosporin use.        Past Medical History:  Diagnosis Date   Abnormal Pap smear    Bacterial vaginosis    Gestational diabetes    likely pre-exisiting; all pregnancies glyburide started 11/4   Gestational thrombocytopenia (HCC)    Headache(784.0)    Hernia, umbilical    History of abnormal cervical Pap smear    normal 2014   Mild preeclampsia delivered 01/22/2016   Type 2 diabetes mellitus (HCC)      Past Surgical History:  Procedure Laterality Date    UMBILICAL HERNIA REPAIR N/A 11/22/2014   Procedure: LAPAROSCOPIC UMBILICAL HERNIA;  Surgeon: Axel Filler, MD;  Location: [facility];  Service: General;  Laterality: N/A;   WISDOM TOOTH EXTRACTION      Family History  Problem Relation Age of Onset   Hypertension Father    Diabetes Father    Diabetes Mother    Seizures Brother     Social History   Tobacco Use   Smoking status: Never   Smokeless tobacco: Never  Vaping Use   Vaping Use: Never used  Substance Use Topics   Alcohol use: No   Drug use: No    ROS   Objective:   Vitals: BP 116/84   Pulse 82   Temp 98.6 F (37 C)   Resp 20   SpO2 98%   Physical Exam Constitutional:      General: She is not in acute distress.    Appearance: Normal appearance. She is well-developed. She is obese. She is not ill-appearing, toxic-appearing or diaphoretic.  HENT:     Head: Normocephalic and atraumatic.     Nose: Nose normal.     Mouth/Throat:     Mouth: Mucous membranes are moist.  Eyes:     General: No scleral icterus.       Right eye: No discharge.        Left eye: No discharge.  Extraocular Movements: Extraocular movements intact.  Cardiovascular:     Rate and Rhythm: Normal rate.  Pulmonary:     Effort: Pulmonary effort is normal.  Skin:    General: Skin is warm and dry.  Neurological:     General: No focal deficit present.     Mental Status: She is alert and oriented to person, place, and time.  Psychiatric:        Mood and Affect: Mood normal.        Behavior: Behavior normal.     Results for orders placed or performed during the hospital encounter of 02/23/22 (from the past 24 hour(s))  POCT urine pregnancy     Status: None   Collection Time: 02/23/22  5:02 PM  Result Value Ref Range   Preg Test, Ur Negative Negative    Assessment and Plan :   PDMP not reviewed this encounter.  1. Acute vaginitis   2. Vaginal discharge   3. Vaginal itching    Recommended empiric treatment for yeast vaginitis  with oral fluconazole.  Testing pending. Counseled patient on potential for adverse effects with medications prescribed/recommended today, ER and return-to-clinic precautions discussed, patient verbalized understanding.    Wallis Bamberg, [place] 02/23/22 [phone]

## 2022-02-26 ENCOUNTER — Telehealth (HOSPITAL_COMMUNITY): Payer: Self-pay | Admitting: Emergency Medicine

## 2022-02-26 LAB — CERVICOVAGINAL ANCILLARY ONLY
Bacterial Vaginitis (gardnerella): POSITIVE — AB
Candida Glabrata: NEGATIVE
Candida Vaginitis: POSITIVE — AB
Chlamydia: NEGATIVE
Comment: NEGATIVE
Comment: NEGATIVE
Comment: NEGATIVE
Comment: NEGATIVE
Comment: NEGATIVE
Comment: NORMAL
Neisseria Gonorrhea: NEGATIVE
Trichomonas: NEGATIVE

## 2022-02-26 MED ORDER — METRONIDAZOLE 0.75 % VA GEL: 1.0000 | Freq: Every day | VAGINAL | 0 refills | Status: AC

## 2022-03-05 ENCOUNTER — Other Ambulatory Visit: Payer: Medicaid Other

## 2022-03-09 ENCOUNTER — Ambulatory Visit
Admission: RE | Admit: 2022-03-09 | Discharge: 2022-03-09 | Disposition: A | Discharge status: Home / Self Care | Payer: Medicaid Other | Source: Ambulatory Visit | Attending: Urgent Care | Admitting: Urgent Care

## 2022-03-09 VITALS — BP 136/84 | HR 104 | Temp 98.6°F | Resp 18 | Wt 303.0 lb

## 2022-03-09 DIAGNOSIS — J4540 Moderate persistent asthma, uncomplicated: Secondary | ICD-10-CM | POA: Diagnosis not present

## 2022-03-09 DIAGNOSIS — R052 Subacute cough: Secondary | ICD-10-CM

## 2022-03-09 DIAGNOSIS — Z8709 Personal history of other diseases of the respiratory system: Secondary | ICD-10-CM

## 2022-03-09 DIAGNOSIS — E119 Type 2 diabetes mellitus without complications: Secondary | ICD-10-CM | POA: Diagnosis not present

## 2022-03-09 DIAGNOSIS — B349 Viral infection, unspecified: Secondary | ICD-10-CM | POA: Diagnosis not present

## 2022-03-09 DIAGNOSIS — Z8616 Personal history of COVID-19: Secondary | ICD-10-CM | POA: Diagnosis not present

## 2022-03-09 MED ORDER — ALBUTEROL SULFATE HFA 108 (90 BASE) MCG/ACT IN AERS: 1.0000 | INHALATION_SPRAY | Freq: Four times a day (QID) | RESPIRATORY_TRACT | 0 refills | Status: DC | PRN

## 2022-03-09 MED ORDER — CETIRIZINE HCL 10 MG PO TABS: 10.0000 mg | ORAL_TABLET | Freq: Every day | ORAL | 0 refills | Status: DC

## 2022-03-09 MED ORDER — PROMETHAZINE-DM 6.25-15 MG/5ML PO SYRP: 5.0000 mL | ORAL_SOLUTION | Freq: Three times a day (TID) | ORAL | 0 refills | Status: DC | PRN

## 2022-03-09 NOTE — Discharge Instructions (Signed)
We will notify you of your test results as they arrive and may take between 48-72 hours.  I encourage you to sign up for MyChart if you have not already done so as this can be the easiest way for [place] to communicate results to you online or through a phone app.  Generally, we only contact you if it is a positive test result.  In the meantime, if you develop worsening symptoms including fever, chest pain, shortness of breath despite our current treatment plan then please report to the emergency room as this may be a sign of worsening status from possible viral infection.  Otherwise, we will manage this as a viral syndrome. For sore throat or cough try using a honey-based tea. Use 3 teaspoons of honey with juice squeezed from half lemon. Place shaved pieces of ginger into 1/2-1 cup of water and warm over stove top. Then mix the ingredients and repeat every 4 hours as needed. Please take Tylenol 500mg -650mg  every 6 hours for aches and pains, fevers. Hydrate very well with at least 2 liters of water. Eat light meals such as soups to replenish electrolytes and soft fruits, veggies. Start an antihistamine like Zyrtec for postnasal drainage, sinus congestion.  You can take this together with the cough syrup.

## 2022-03-09 NOTE — ED Triage Notes (Signed)
The pt c/o cough, sore throat, headache, body aches for 2 days.  Home interventions: nyquil

## 2022-03-09 NOTE — ED Provider Notes (Signed)
[facility]   MRN: [id number] DOB: 1986-01-15  Subjective:   Monique Schmidt is a 36 y.o. female presenting for 2-day history of acute onset coughing, throat pain, body aches, headaches.  Patient has a history of moderate persistent asthma, type 2 diabetes treated without insulin.  She is also had COVID-19 and had a severe bout that led to respiratory failure and hospitalization.  Her diabetes is severely uncontrolled.  Patient is not a smoker.  No current facility-administered medications for this encounter.  Current Outpatient Medications:    Accu-Chek Softclix Lancets lancets, Use four times daily as instructed., Disp: 100 each, Rfl: 12   fluconazole (DIFLUCAN) 150 MG tablet, Take 1 tablet (150 mg total) by mouth once a week., Disp: 2 tablet, Rfl: 0   glucose blood test strip, Use as instructed, Disp: 100 each, Rfl: 12   metFORMIN (GLUCOPHAGE) 500 MG tablet, Take 1 tablet (500 mg total) by mouth 2 (two) times daily with a meal., Disp: 60 tablet, Rfl: 1   Allergies  Allergen Reactions   Latex Itching and Rash   Penicillins Itching and Rash    Did it involve swelling of the face/tongue/throat, SOB, or low BP? N Did it involve sudden or severe rash/hives, skin peeling, or any reaction on the inside of your mouth or nose? Y Did you need to seek medical attention at a hospital or doctor's office? No When did it last happen?  Several  Years Ago    If all above answers are "NO", may proceed with cephalosporin use.        Past Medical History:  Diagnosis Date   Abnormal Pap smear    Bacterial vaginosis    Gestational diabetes    likely pre-exisiting; all pregnancies glyburide started 11/4   Gestational thrombocytopenia (HCC)    Headache(784.0)    Hernia, umbilical    History of abnormal cervical Pap smear    normal 2014   Mild preeclampsia delivered 01/22/2016   Type 2 diabetes mellitus (HCC)      Past Surgical History:  Procedure Laterality Date    UMBILICAL HERNIA REPAIR N/A 11/22/2014   Procedure: LAPAROSCOPIC UMBILICAL HERNIA;  Surgeon: Axel Filler, MD;  Location: [facility] OR;  Service: General;  Laterality: N/A;   WISDOM TOOTH EXTRACTION      Family History  Problem Relation Age of Onset   Hypertension Father    Diabetes Father    Diabetes Mother    Seizures Brother     Social History   Tobacco Use   Smoking status: Never   Smokeless tobacco: Never  Vaping Use   Vaping Use: Never used  Substance Use Topics   Alcohol use: No   Drug use: No    ROS   Objective:   Vitals: BP 136/84 (BP Location: Left Arm)   Pulse (!) 104   Temp 98.6 F (37 C) (Oral)   Resp 18   Wt (!) 303 lb (137.4 kg)   LMP 02/25/2022 (Approximate)   SpO2 97%   BMI 48.91 kg/m   Physical Exam Constitutional:      General: She is not in acute distress.    Appearance: Normal appearance. She is well-developed. She is obese. She is not ill-appearing, toxic-appearing or diaphoretic.  HENT:     Head: Normocephalic and atraumatic.     Nose: Nose normal.     Mouth/Throat:     Mouth: Mucous membranes are moist.  Eyes:     General: No scleral icterus.  Right eye: No discharge.        Left eye: No discharge.     Extraocular Movements: Extraocular movements intact.  Cardiovascular:     Rate and Rhythm: Normal rate and regular rhythm.     Heart sounds: Normal heart sounds. No murmur heard.    No friction rub. No gallop.  Pulmonary:     Effort: Pulmonary effort is normal. No respiratory distress.     Breath sounds: No stridor. No wheezing, rhonchi or rales.  Chest:     Chest wall: No tenderness.  Skin:    General: Skin is warm and dry.  Neurological:     General: No focal deficit present.     Mental Status: She is alert and oriented to person, place, and time.  Psychiatric:        Mood and Affect: Mood normal.        Behavior: Behavior normal.     Assessment and Plan :   PDMP not reviewed this encounter.  1. Acute viral  syndrome   2. Subacute cough   3. Type 2 diabetes mellitus treated without insulin (HCC)   4. Moderate persistent asthma without complication   5. History of COVID-19   6. History of respiratory failure    Patient has a clear pulmonary exam and therefore will defer imaging.  Will manage for viral illness such as viral URI, viral syndrome, viral rhinitis, COVID-19. Recommended supportive care. Offered scripts for symptomatic relief. Testing is pending.  No history of CKD, if patient is positive for COVID-19, would be a good candidate for COVID antivirals.  Counseled patient on potential for adverse effects with medications prescribed/recommended today, ER and return-to-clinic precautions discussed, patient verbalized understanding.     Wallis Bamberg, [place] 03/09/22 1718

## 2022-03-10 LAB — NOVEL CORONAVIRUS, NAA: SARS-CoV-2, NAA: DETECTED — AB

## 2022-04-18 ENCOUNTER — Ambulatory Visit
Admission: EM | Admit: 2022-04-18 | Discharge: 2022-04-18 | Disposition: A | Discharge status: Home / Self Care | Payer: Medicaid Other | Attending: Emergency Medicine | Admitting: Emergency Medicine

## 2022-04-18 DIAGNOSIS — Z8619 Personal history of other infectious and parasitic diseases: Secondary | ICD-10-CM | POA: Diagnosis not present

## 2022-04-18 DIAGNOSIS — N760 Acute vaginitis: Secondary | ICD-10-CM | POA: Diagnosis not present

## 2022-04-18 DIAGNOSIS — E1165 Type 2 diabetes mellitus with hyperglycemia: Secondary | ICD-10-CM | POA: Insufficient documentation

## 2022-04-18 LAB — POCT URINALYSIS DIP (MANUAL ENTRY)
Glucose, UA: 500 mg/dL — AB
Nitrite, UA: NEGATIVE
Protein Ur, POC: 30 mg/dL — AB
Spec Grav, UA: >=1.03 — AB (ref 1.010–1.025)
Urobilinogen, UA: 0.2 E.U./dL
pH, UA: 5.5 (ref 5.0–8.0)

## 2022-04-18 MED ORDER — CLOTRIMAZOLE 1 % EX CREA: TOPICAL_CREAM | CUTANEOUS | 0 refills | Status: DC

## 2022-04-18 NOTE — Discharge Instructions (Addendum)
I noticed that you advised our nurse that you are no longer taking metformin for your type 2 diabetes.  If this medication causes unwanted side effects, please consider talking with your PCP about other ways to control your diabetes.  Your daily average blood sugar should be between 80 and 120.  Because your hemoglobin A1c was 12.9 when it was last checked, your daily average blood sugar is consistently above 250.  When your blood sugar levels are consistently over 250, you began to spill sugar into your urine.  When there is a lot of sugar in your urine, you are much more likely to have repeat yeast infections, bacterial vaginitis infections and urinary tract infections which can quickly become complicated because your immune system is weakened by your diabetes.  I do not think that the brand of toilet paper you are using at this time is the cause of the current vaginal infection that you are experiencing.  Your urinalysis today revealed a significant amount of sugar in your urine.  Please, please reach out to your primary care provider to discuss other alternatives for keeping your diabetes well controlled.  Your goal is to keep your blood sugar levels between 80 and 120 and to have an A1c of 6.5.  The results of the vaginal swab test that we did today will be available in the next day or 2.  Based on those results, we will provide you treatment for current symptoms.  While you are waiting for those results, I recommend that you use clotrimazole cream topically as needed.  I have sent a prescription to your pharmacy.  Thank you for visiting urgent care today.

## 2022-04-18 NOTE — ED Triage Notes (Signed)
Pt c/o vaginal itching and rash that began 2-3 days ago.

## 2022-04-18 NOTE — ED Provider Notes (Signed)
[facility]    CSN: [id number] Arrival date & time: 04/18/22  1645    HISTORY   Chief Complaint  Patient presents with   Rash   Vaginal Itching   HPI Monique Schmidt is a pleasant, 36 y.o. female who presents to urgent care today. Patient complains of vaginal irritation and itching as well as thick white vaginal discharge with a fishy odor.  Patient states she recently switched brands of toilet paper and thinks she just may be very sensitive to different brands.  Patient denies burning with urination, increased frequency of urination, increased urge to urinate, sensation of incomplete emptying, suprapubic pain, and dysuria.  Patient has a history of poorly controlled type 2 diabetes, not currently taking metformin as prescribed, last A1c was greater than 12.  Patient tested positive for and was treated for BV and yeast February 23, 2022, patient was prescribed Diflucan 150 mg tablets once weekly for 2 doses as well as metronidazole vaginal gel nightly for 5 days.   The history is provided by the patient.   Past Medical History:  Diagnosis Date   Abnormal Pap smear    Bacterial vaginosis    Gestational diabetes    likely pre-exisiting; all pregnancies glyburide started 11/4   Gestational thrombocytopenia (HCC)    Headache(784.0)    Hernia, umbilical    History of abnormal cervical Pap smear    normal 2014   Mild preeclampsia delivered 01/22/2016   Type 2 diabetes mellitus [facility])    Patient Active Problem List   Diagnosis Date Noted   Acute respiratory failure with hypoxia (HCC)    COVID-19 03/31/2020   Cervical high risk HPV (human papillomavirus) test positive 06/29/2019   History of gestational diabetes 06/26/2019   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   BMI 50.0-59.9, adult (HCC) 11/09/2013   Moderate persistent asthma 08/07/2013   Diabetes mellitus (HCC) 04/16/2011   Past Surgical History:  Procedure Laterality Date   UMBILICAL HERNIA REPAIR N/A 11/22/2014    Procedure: LAPAROSCOPIC UMBILICAL HERNIA;  Surgeon: Axel Filler, MD;  Location: [facility] OR;  Service: General;  Laterality: N/A;   WISDOM TOOTH EXTRACTION     OB History     Gravida  6   Para  5   Term  5   Preterm      AB  1   Living  5      SAB  1   IAB      Ectopic      Multiple  0   Live Births  5          Home Medications    Prior to Admission medications   Medication Sig Start Date End Date Taking? Authorizing Provider  Accu-Chek Softclix Lancets lancets Use four times daily as instructed. 01/11/22   Warden Fillers, MD  albuterol (VENTOLIN HFA) 108 (90 Base) MCG/ACT inhaler Inhale 1-2 puffs into the lungs every 6 (six) hours as needed for wheezing or shortness of breath. 03/09/22   Wallis Bamberg, PA-C  cetirizine (ZYRTEC ALLERGY) 10 MG tablet Take 1 tablet (10 mg total) by mouth daily. 03/09/22   Wallis Bamberg, PA-C  glucose blood test strip Use as instructed 01/11/22   Warden Fillers, MD  metFORMIN (GLUCOPHAGE) 500 MG tablet Take 1 tablet (500 mg total) by mouth 2 (two) times daily with a meal. 07/25/21 08/24/21  Ward, Tylene Fantasia, PA-C  medroxyPROGESTERone (PROVERA) 10 MG tablet Take 1 tablet (10 mg total) by mouth daily. 06/24/19 08/18/19  Shawnie Pons,  Shelbie Proctor, MD    Family History Family History  Problem Relation Age of Onset   Hypertension Father    Diabetes Father    Diabetes Mother    Seizures Brother    Social History Social History   Tobacco Use   Smoking status: Never   Smokeless tobacco: Never  Vaping Use   Vaping Use: Never used  Substance Use Topics   Alcohol use: No   Drug use: No   Allergies   Latex and Penicillins  Review of Systems Review of Systems Pertinent findings revealed after performing a 14 point review of systems has been noted in the history of present illness.  Physical Exam Triage Vital Signs ED Triage Vitals  Enc Vitals Group     BP 06/16/21 0827 (!) 147/82     Pulse Rate 06/16/21 0827 72     Resp 06/16/21 0827 18      Temp 06/16/21 0827 98.3 F (36.8 C)     Temp Source 06/16/21 0827 Oral     SpO2 06/16/21 0827 98 %     Weight --      Height --      Head Circumference --      Peak Flow --      Pain Score 06/16/21 0826 5     Pain Loc --      Pain Edu? --      Excl. in GC? --   No data found.  Updated Vital Signs BP 117/80 (BP Location: Left Arm)   Pulse 98   Temp 98.8 F (37.1 C) (Oral)   Resp 18   LMP 03/28/2022 (Approximate)   SpO2 95%   Physical Exam Vitals and nursing note reviewed.  Constitutional:      General: She is not in acute distress.    Appearance: Normal appearance. She is not ill-appearing.  HENT:     Head: Normocephalic and atraumatic.  Eyes:     General: Lids are normal.        Right eye: No discharge.        Left eye: No discharge.     Extraocular Movements: Extraocular movements intact.     Conjunctiva/sclera: Conjunctivae normal.     Right eye: Right conjunctiva is not injected.     Left eye: Left conjunctiva is not injected.  Neck:     Trachea: Trachea and phonation normal.  Cardiovascular:     Rate and Rhythm: Normal rate and regular rhythm.     Pulses: Normal pulses.     Heart sounds: Normal heart sounds. No murmur heard.    No friction rub. No gallop.  Pulmonary:     Effort: Pulmonary effort is normal. No accessory muscle usage, prolonged expiration or respiratory distress.     Breath sounds: Normal breath sounds. No stridor, decreased air movement or transmitted upper airway sounds. No decreased breath sounds, wheezing, rhonchi or rales.  Chest:     Chest wall: No tenderness.  Genitourinary:    Comments: Patient politely declines pelvic exam today, patient provided a vaginal swab for testing. Musculoskeletal:        General: Normal range of motion.     Cervical back: Normal range of motion and neck supple. Normal range of motion.  Lymphadenopathy:     Cervical: No cervical adenopathy.  Skin:    General: Skin is warm and dry.     Findings: No  erythema or rash.  Neurological:     General: No focal deficit present.  Mental Status: She is alert and oriented to person, place, and time.  Psychiatric:        Mood and Affect: Mood normal.        Behavior: Behavior normal.     Visual Acuity Right Eye Distance:   Left Eye Distance:   Bilateral Distance:    Right Eye Near:   Left Eye Near:    Bilateral Near:     [facility] Couse / Diagnostics / Procedures:     Radiology No results found.  Procedures Procedures (including critical care time) EKG  Pending results:  Labs Reviewed  POCT URINALYSIS DIP (MANUAL ENTRY) - Abnormal; Notable for the following components:      Result Value   Clarity, UA cloudy (*)    Glucose, UA =500 (*)    Bilirubin, UA small (*)    Ketones, POC UA trace (5) (*)    Spec Grav, UA >=1.030 (*)    Blood, UA small (*)    Protein Ur, POC =30 (*)    Leukocytes, UA Trace (*)    All other components within normal limits  CERVICOVAGINAL ANCILLARY ONLY    Medications Ordered in UC: Medications - No data to display  UC Diagnoses / Final Clinical Impressions(s)   I have reviewed the triage vital signs and the nursing notes.  Pertinent labs & imaging results that were available during my care of the patient were reviewed by me and considered in my medical decision making (see chart for details).    Final diagnoses:  Vulvovaginitis  History of candidal vulvovaginitis  Poorly controlled type 2 diabetes mellitus (HCC)   Because fluconazole only maintains therapeutic concentrations and vaginal secretions for 72 hours after the ingestion of a single 150 mg tablet, it is likely patient was undertreated for vaginal Candida as seen on her vaginal swab in July.  We will repeat STD testing today and notify patient of results once received.  Recommend that we wait on empiric treatment so that we can optimize her treatment based on her STD swab results.  Patient is immune compromised due to uncontrolled type 2  diabetes.  If patient test positive for candidiasis, recommend patient be provided with fluconazole 150 mg every 72 hours x3 doses.  Patient was provided with a prescription for clotrimazole for comfort while she waits for her results.  ED Prescriptions     Medication Sig Dispense Auth. Provider   clotrimazole (LOTRIMIN) 1 % cream Apply to affected area 2 times daily 28 g Theadora Rama Scales, PA-C      PDMP not reviewed this encounter.  Disposition Upon Discharge:  Condition: stable for discharge home  Patient presented with concern for an acute illness with associated systemic symptoms and significant discomfort requiring urgent management. In my opinion, this is a condition that a prudent lay person (someone who possesses an average knowledge of health and medicine) may potentially expect to result in complications if not addressed urgently such as respiratory distress, impairment of bodily function or dysfunction of bodily organs.   As such, the patient has been evaluated and assessed, work-up was performed and treatment was provided in alignment with urgent care protocols and evidence based medicine.  Patient/parent/caregiver has been advised that the patient may require follow up for further testing and/or treatment if the symptoms continue in spite of treatment, as clinically indicated and appropriate.  Routine symptom specific, illness specific and/or disease specific instructions were discussed with the patient and/or caregiver at length.  Prevention strategies for avoiding  STD exposure were also discussed.  The patient will follow up with their current PCP if and as advised. If the patient does not currently have a PCP we will assist them in obtaining one.   The patient may need specialty follow up if the symptoms continue, in spite of conservative treatment and management, for further workup, evaluation, consultation and treatment as clinically indicated and  appropriate.  Patient/parent/caregiver verbalized understanding and agreement of plan as discussed.  All questions were addressed during visit.  Please see discharge instructions below for further details of plan.  Discharge Instructions:   Discharge Instructions      I noticed that you advised our nurse that you are no longer taking metformin for your type 2 diabetes.  If this medication causes unwanted side effects, please consider talking with your PCP about other ways to control your diabetes.  Your daily average blood sugar should be between 80 and 120.  Because your hemoglobin A1c was 12.9 when it was last checked, your daily average blood sugar is consistently above 250.  When your blood sugar levels are consistently over 250, you began to spill sugar into your urine.  When there is a lot of sugar in your urine, you are much more likely to have repeat yeast infections, bacterial vaginitis infections and urinary tract infections which can quickly become complicated because your immune system is weakened by your diabetes.  I do not think that the brand of toilet paper you are using at this time is the cause of the current vaginal infection that you are experiencing.  Your urinalysis today revealed a significant amount of sugar in your urine.  Please, please reach out to your primary care provider to discuss other alternatives for keeping your diabetes well controlled.  Your goal is to keep your blood sugar levels between 80 and 120 and to have an A1c of 6.5.  The results of the vaginal swab test that we did today will be available in the next day or 2.  Based on those results, we will provide you treatment for current symptoms.  While you are waiting for those results, I recommend that you use clotrimazole cream topically as needed.  I have sent a prescription to your pharmacy.  Thank you for visiting urgent care today.        This office note has been dictated using Engineer, structural.  Unfortunately, this method of dictation can sometimes lead to typographical or grammatical errors.  I apologize for your inconvenience in advance if this occurs.  Please do not hesitate to reach out to me if clarification is needed.       Theadora Rama Scales, PA-C 04/18/22 1731

## 2022-04-19 LAB — CERVICOVAGINAL ANCILLARY ONLY
Bacterial Vaginitis (gardnerella): POSITIVE — AB
Candida Glabrata: NEGATIVE
Candida Vaginitis: POSITIVE — AB
Chlamydia: NEGATIVE
Comment: NEGATIVE
Comment: NEGATIVE
Comment: NEGATIVE
Comment: NEGATIVE
Comment: NEGATIVE
Comment: NORMAL
Neisseria Gonorrhea: NEGATIVE
Trichomonas: NEGATIVE

## 2022-04-20 ENCOUNTER — Telehealth (HOSPITAL_COMMUNITY): Payer: Self-pay | Admitting: Emergency Medicine

## 2022-04-20 MED ORDER — METRONIDAZOLE 500 MG PO TABS: 500.0000 mg | ORAL_TABLET | Freq: Two times a day (BID) | ORAL | 0 refills | Status: DC

## 2022-04-20 MED ORDER — FLUCONAZOLE 150 MG PO TABS: 150.0000 mg | ORAL_TABLET | Freq: Once | ORAL | 0 refills | Status: AC

## 2022-07-18 IMAGING — DX DG LUMBAR SPINE COMPLETE 4+V
5 series · 5 of 5 positions shown · non-contrast
Comparison: None.

CLINICAL DATA: Pain with percussion of lumbar vertebra. Back pain
since 3755.

EXAM:
LUMBAR SPINE - COMPLETE 4+ VIEW

[l-spine ap]
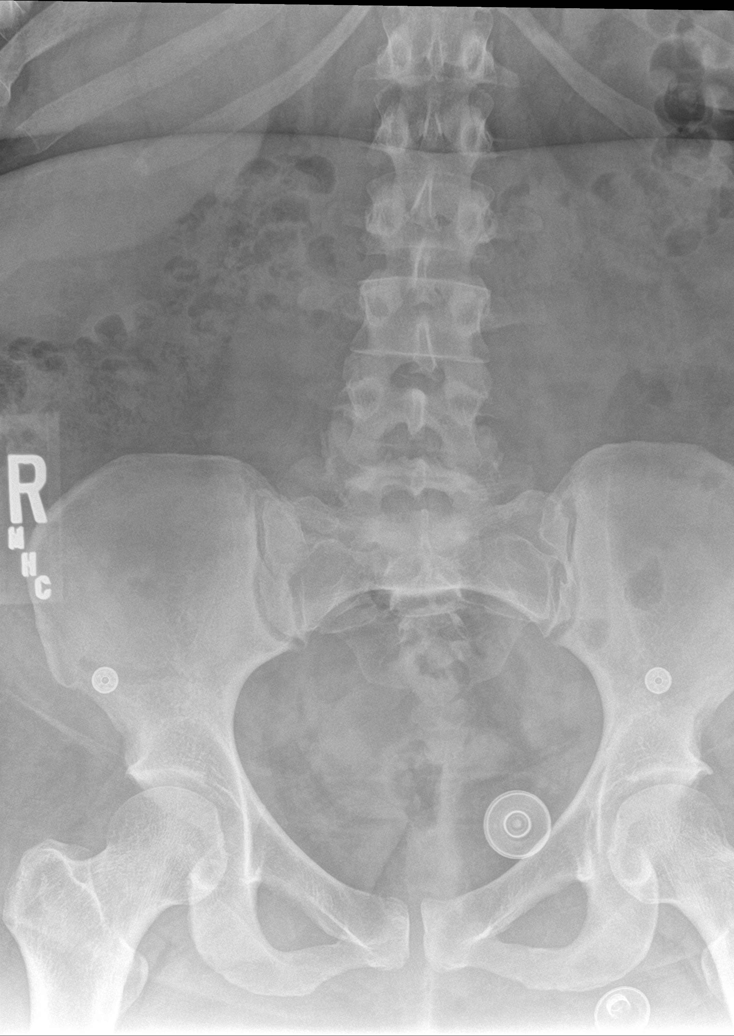

[l-spine obl (1 of 2)]
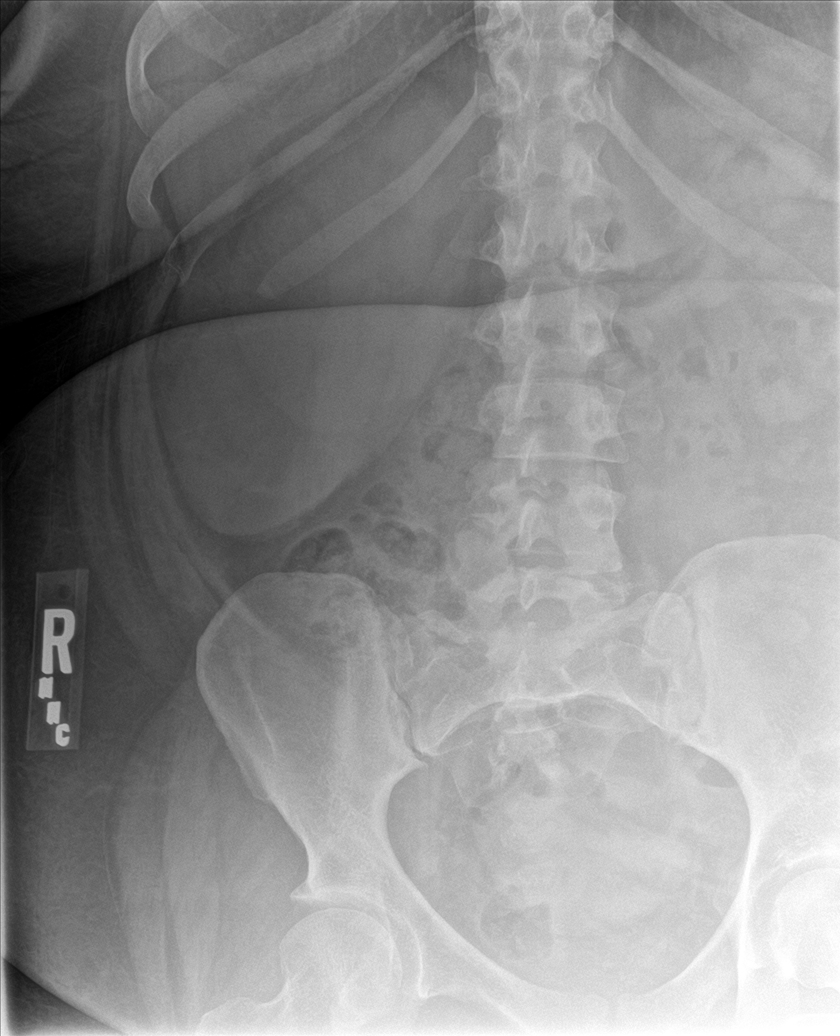

[l-spine obl (2 of 2)]
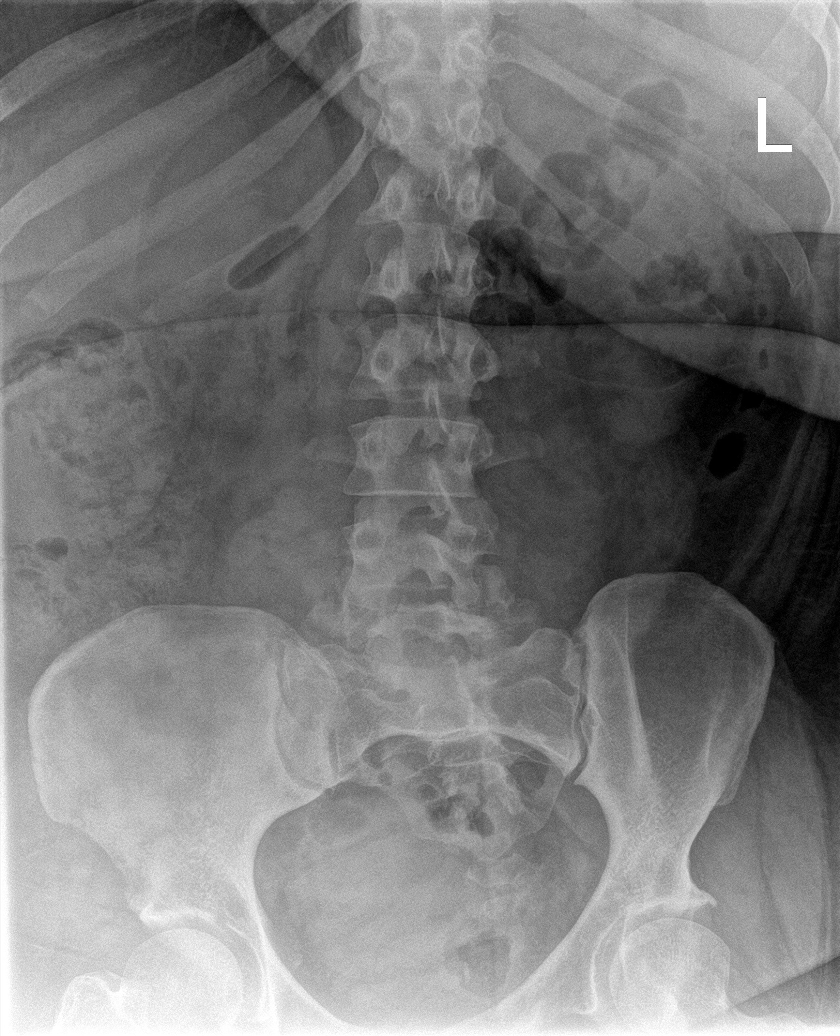

[l-spine lat]
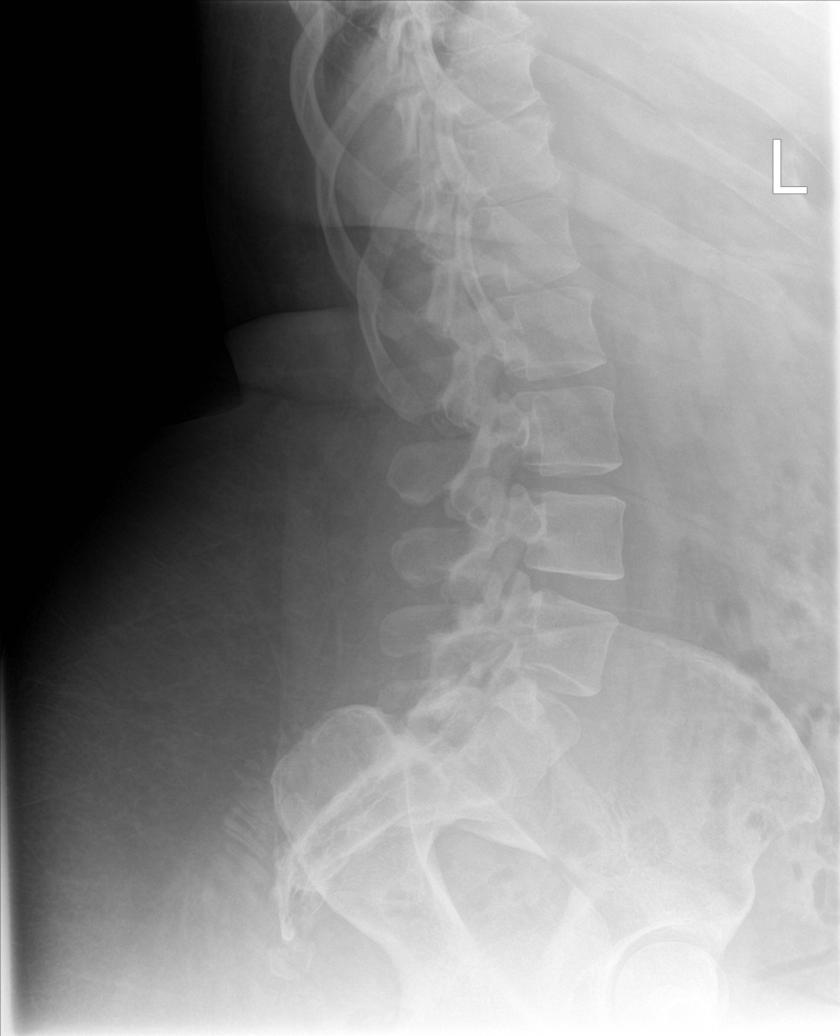

[l-spine spot]
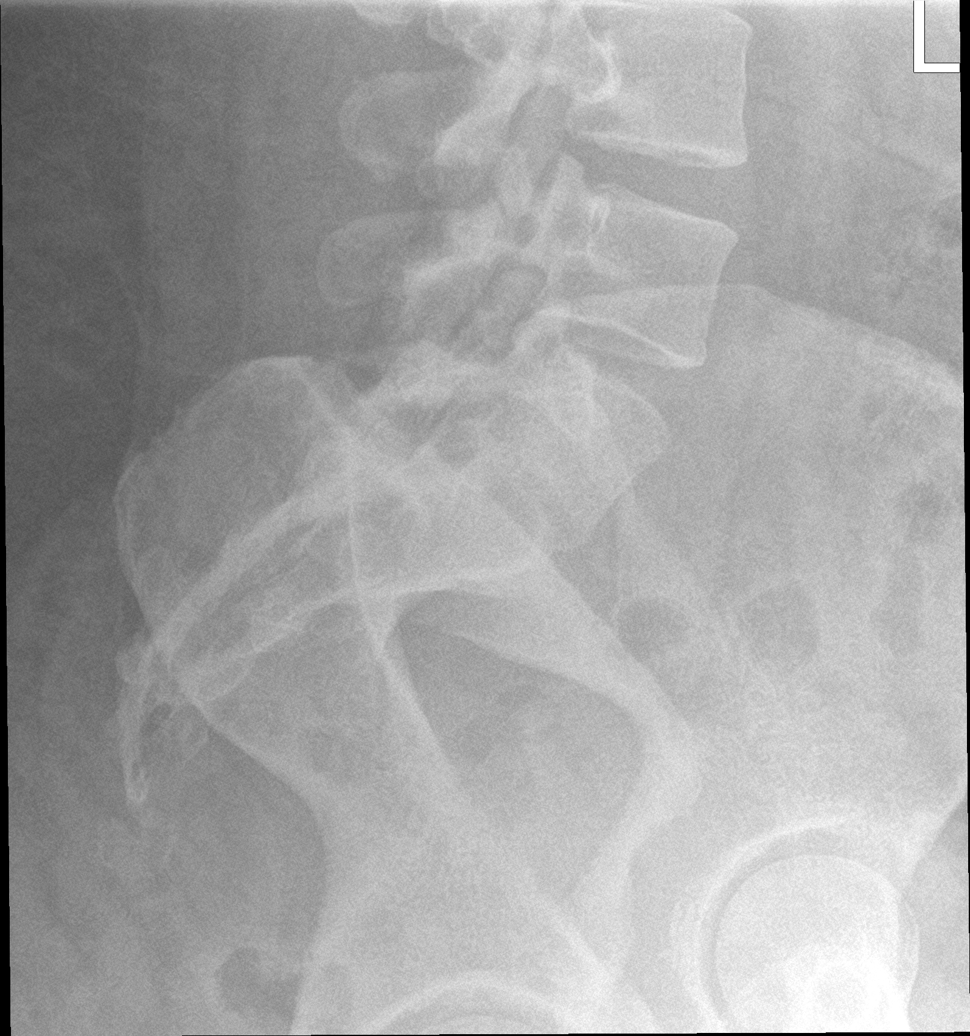

[5 of 5 positions shown; findings below may reference images not displayed]

FINDINGS: There is no evidence of lumbar spine fracture. Alignment is normal.
Intervertebral disc spaces are maintained.
IMPRESSION: Negative.

## 2022-08-02 ENCOUNTER — Ambulatory Visit (HOSPITAL_COMMUNITY)
Admission: EM | Admit: 2022-08-02 | Discharge: 2022-08-02 | Disposition: A | Discharge status: Home / Self Care | Payer: Medicaid Other | Attending: Family Medicine | Admitting: Family Medicine

## 2022-08-02 ENCOUNTER — Encounter (HOSPITAL_COMMUNITY): Payer: Self-pay | Admitting: *Deleted

## 2022-08-02 DIAGNOSIS — N760 Acute vaginitis: Secondary | ICD-10-CM | POA: Diagnosis not present

## 2022-08-02 LAB — POC URINE PREG, ED: Preg Test, Ur: NEGATIVE

## 2022-08-02 MED ORDER — FLUCONAZOLE 150 MG PO TABS: 150.0000 mg | ORAL_TABLET | ORAL | 0 refills | Status: AC

## 2022-08-02 NOTE — ED Triage Notes (Signed)
Pt states white discharge and itching X 3 days not using any meds OTC. She states her last cycle was 05/21/2022, so she is requesting a pregnancy test.

## 2022-08-02 NOTE — Discharge Instructions (Signed)
The pregnancy test was negative  Take fluconazole 150 mg--1 tablet every 3 days for 2 doses   Will notify you of any positives on the swab test

## 2022-08-02 NOTE — ED Provider Notes (Signed)
[facility]    CSN: [id number] Arrival date & time: 08/02/22  1949      History   Chief Complaint Chief Complaint  Patient presents with   Vaginal Discharge   Vaginal Itching   Possible Pregnancy    HPI Monique Schmidt is a 36 y.o. female.    Vaginal Discharge Associated symptoms: vaginal itching   Vaginal Itching  Possible Pregnancy   Here for quite vaginal discharge for a few days, which has been like cottage cheese.  No abdominal pain no fever or vomiting.  Last menstrual cycle began October 2.  She states she often is irregular  Past Medical History:  Diagnosis Date   Abnormal Pap smear    Bacterial vaginosis    Gestational diabetes    likely pre-exisiting; all pregnancies glyburide started 11/4   Gestational thrombocytopenia (HCC)    Headache(784.0)    Hernia, umbilical    History of abnormal cervical Pap smear    normal 2014   Mild preeclampsia delivered 01/22/2016   Type 2 diabetes mellitus [facility])     Patient Active Problem List   Diagnosis Date Noted   Acute respiratory failure with hypoxia (HCC)    COVID-19 03/31/2020   Cervical high risk HPV (human papillomavirus) test positive 06/29/2019   History of gestational diabetes 06/26/2019   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   BMI 50.0-59.9, adult (HCC) 11/09/2013   Moderate persistent asthma 08/07/2013   Diabetes mellitus (HCC) 04/16/2011    Past Surgical History:  Procedure Laterality Date   UMBILICAL HERNIA REPAIR N/A 11/22/2014   Procedure: LAPAROSCOPIC UMBILICAL HERNIA;  Surgeon: Axel Filler, MD;  Location: [facility] OR;  Service: General;  Laterality: N/A;   WISDOM TOOTH EXTRACTION      OB History     Gravida  6   Para  5   Term  5   Preterm      AB  1   Living  5      SAB  1   IAB      Ectopic      Multiple  0   Live Births  5            Home Medications    Prior to Admission medications   Medication Sig Start Date End Date Taking? Authorizing Provider   fluconazole (DIFLUCAN) 150 MG tablet Take 1 tablet (150 mg total) by mouth every 3 (three) days for 2 doses. 08/02/22 08/06/22 Yes Zenia Resides, MD  Accu-Chek Softclix Lancets lancets Use four times daily as instructed. 01/11/22   Warden Fillers, MD  albuterol (VENTOLIN HFA) 108 (90 Base) MCG/ACT inhaler Inhale 1-2 puffs into the lungs every 6 (six) hours as needed for wheezing or shortness of breath. 03/09/22   Wallis Bamberg, PA-C  cetirizine (ZYRTEC ALLERGY) 10 MG tablet Take 1 tablet (10 mg total) by mouth daily. 03/09/22   Wallis Bamberg, PA-C  glucose blood test strip Use as instructed 01/11/22   Warden Fillers, MD  metFORMIN (GLUCOPHAGE) 500 MG tablet Take 1 tablet (500 mg total) by mouth 2 (two) times daily with a meal. 07/25/21 08/24/21  Ward, Tylene Fantasia, PA-C  medroxyPROGESTERone (PROVERA) 10 MG tablet Take 1 tablet (10 mg total) by mouth daily. 06/24/19 08/18/19  Reva Bores, MD    Family History Family History  Problem Relation Age of Onset   Hypertension Father    Diabetes Father    Diabetes Mother    Seizures Brother     Social  History Social History   Tobacco Use   Smoking status: Never   Smokeless tobacco: Never  Vaping Use   Vaping Use: Never used  Substance Use Topics   Alcohol use: No   Drug use: No     Allergies   Latex and Penicillins   Review of Systems Review of Systems  Genitourinary:  Positive for vaginal discharge.     Physical Exam Triage Vital Signs ED Triage Vitals  Enc Vitals Group     BP 08/02/22 2119 109/86     Pulse Rate 08/02/22 2119 88     Resp 08/02/22 2119 18     Temp 08/02/22 2119 98.7 F (37.1 C)     Temp Source 08/02/22 2119 Oral     SpO2 08/02/22 2119 100 %     Weight --      Height --      Head Circumference --      Peak Flow --      Pain Score 08/02/22 2118 0     Pain Loc --      Pain Edu? --      Excl. in GC? --    No data found.  Updated Vital Signs BP 109/86 (BP Location: Right Arm)   Pulse 88   Temp 98.7  F (37.1 C) (Oral)   Resp 18   LMP 05/21/2022 (Approximate)   SpO2 100%   Visual Acuity Right Eye Distance:   Left Eye Distance:   Bilateral Distance:    Right Eye Near:   Left Eye Near:    Bilateral Near:     Physical Exam Vitals reviewed.  Constitutional:      General: She is not in acute distress.    Appearance: She is not ill-appearing, toxic-appearing or diaphoretic.  Cardiovascular:     Rate and Rhythm: Normal rate and regular rhythm.     Heart sounds: No murmur heard. Pulmonary:     Effort: Pulmonary effort is normal.     Breath sounds: Normal breath sounds.  Skin:    Coloration: Skin is not jaundiced or pale.  Neurological:     Mental Status: She is alert and oriented to person, place, and time.  Psychiatric:        Behavior: Behavior normal.      UC Treatments / Results  Labs (all labs ordered are listed, but only abnormal results are displayed) Labs Reviewed  POC URINE PREG, ED  CERVICOVAGINAL ANCILLARY ONLY    EKG   Radiology No results found.  Procedures Procedures (including critical care time)  Medications Ordered in UC Medications - No data to display  Initial Impression / Assessment and Plan / UC Course  I have reviewed the triage vital signs and the nursing notes.  Pertinent labs & imaging results that were available during my care of the patient were reviewed by me and considered in my medical decision making (see chart for details).        UPT is negative.  Diflucan is sent empirically for possible yeast vaginitis, and staff will notify her of any other positives on the swab Final Clinical Impressions(s) / UC Diagnoses   Final diagnoses:  Acute vaginitis     Discharge Instructions      The pregnancy test was negative  Take fluconazole 150 mg--1 tablet every 3 days for 2 doses   Will notify you of any positives on the swab test     ED Prescriptions     Medication Sig Dispense Auth.  Provider   fluconazole  (DIFLUCAN) 150 MG tablet Take 1 tablet (150 mg total) by mouth every 3 (three) days for 2 doses. 2 tablet Marlinda Mike Janace Aris, MD      PDMP not reviewed this encounter.   Zenia Resides, MD 08/02/22 2130

## 2022-08-03 LAB — CERVICOVAGINAL ANCILLARY ONLY
Bacterial Vaginitis (gardnerella): NEGATIVE
Candida Glabrata: POSITIVE — AB
Candida Vaginitis: POSITIVE — AB
Chlamydia: NEGATIVE
Comment: NEGATIVE
Comment: NEGATIVE
Comment: NEGATIVE
Comment: NEGATIVE
Comment: NEGATIVE
Comment: NORMAL
Neisseria Gonorrhea: NEGATIVE
Trichomonas: NEGATIVE

## 2022-08-07 ENCOUNTER — Ambulatory Visit
Admission: EM | Admit: 2022-08-07 | Discharge: 2022-08-07 | Disposition: A | Discharge status: Home / Self Care | Payer: Medicaid Other | Attending: Urgent Care | Admitting: Urgent Care

## 2022-08-07 DIAGNOSIS — B3731 Acute candidiasis of vulva and vagina: Secondary | ICD-10-CM | POA: Diagnosis not present

## 2022-08-07 MED ORDER — FLUCONAZOLE 150 MG PO TABS: 150.0000 mg | ORAL_TABLET | ORAL | 0 refills | Status: DC

## 2022-08-07 MED ORDER — CLOTRIMAZOLE 1 % VA CREA: 1.0000 | TOPICAL_CREAM | Freq: Every day | VAGINAL | 1 refills | Status: DC

## 2022-08-07 NOTE — ED Triage Notes (Signed)
Pt c/o cont'd vaginal itching-states she was seen at [facility] no improvement with rx-NAD-steady gait

## 2022-08-07 NOTE — ED Provider Notes (Signed)
[facility]  Note:  This document was prepared using Conservation officer, historic buildings and may include unintentional dictation errors.  MRN: [id number] DOB: 1986-08-20  Subjective:   LORIANN BOSSERMAN is a 36 y.o. female presenting for recheck on persistent vaginal itching.  Patient was recently seen and tested through the vaginal swab.  She tested positive for 2 types of yeast infections.  She did take oral fluconazole as prescribed but unfortunately states that she feels like her symptoms are worse.  Typically she states that she responds better to topical treatment and could be found through her chart.  No current facility-administered medications for this encounter.  Current Outpatient Medications:    Accu-Chek Softclix Lancets lancets, Use four times daily as instructed., Disp: 100 each, Rfl: 12   albuterol (VENTOLIN HFA) 108 (90 Base) MCG/ACT inhaler, Inhale 1-2 puffs into the lungs every 6 (six) hours as needed for wheezing or shortness of breath., Disp: 18 g, Rfl: 0   cetirizine (ZYRTEC ALLERGY) 10 MG tablet, Take 1 tablet (10 mg total) by mouth daily., Disp: 30 tablet, Rfl: 0   glucose blood test strip, Use as instructed, Disp: 100 each, Rfl: 12   metFORMIN (GLUCOPHAGE) 500 MG tablet, Take 1 tablet (500 mg total) by mouth 2 (two) times daily with a meal., Disp: 60 tablet, Rfl: 1   Allergies  Allergen Reactions   Latex Itching and Rash   Penicillins Itching and Rash    Did it involve swelling of the face/tongue/throat, SOB, or low BP? N Did it involve sudden or severe rash/hives, skin peeling, or any reaction on the inside of your mouth or nose? Y Did you need to seek medical attention at a hospital or doctor's office? No When did it last happen?  Several  Years Ago    If all above answers are "NO", may proceed with cephalosporin use.        Past Medical History:  Diagnosis Date   Abnormal Pap smear    Bacterial vaginosis    Gestational diabetes     likely pre-exisiting; all pregnancies glyburide started 11/4   Gestational thrombocytopenia (HCC)    Headache(784.0)    Hernia, umbilical    History of abnormal cervical Pap smear    normal 2014   Mild preeclampsia delivered 01/22/2016   Type 2 diabetes mellitus (HCC)      Past Surgical History:  Procedure Laterality Date   UMBILICAL HERNIA REPAIR N/A 11/22/2014   Procedure: LAPAROSCOPIC UMBILICAL HERNIA;  Surgeon: Axel Filler, MD;  Location: [facility];  Service: General;  Laterality: N/A;   WISDOM TOOTH EXTRACTION      Family History  Problem Relation Age of Onset   Hypertension Father    Diabetes Father    Diabetes Mother    Seizures Brother     Social History   Tobacco Use   Smoking status: Never   Smokeless tobacco: Never  Vaping Use   Vaping Use: Never used  Substance Use Topics   Alcohol use: No   Drug use: No    ROS   Objective:   Vitals: BP 113/79 (BP Location: Right Arm)   Pulse 83   Temp 98.1 F (36.7 C) (Oral)   Resp 16   LMP 05/21/2022 (Approximate) Comment: hx irreg periods  SpO2 95%   Physical Exam Constitutional:      General: She is not in acute distress.    Appearance: Normal appearance. She is well-developed. She is not ill-appearing, toxic-appearing or  diaphoretic.  HENT:     Head: Normocephalic and atraumatic.     Nose: Nose normal.     Mouth/Throat:     Mouth: Mucous membranes are moist.  Eyes:     General: No scleral icterus.       Right eye: No discharge.        Left eye: No discharge.     Extraocular Movements: Extraocular movements intact.  Cardiovascular:     Rate and Rhythm: Normal rate.  Pulmonary:     Effort: Pulmonary effort is normal.  Skin:    General: Skin is warm and dry.  Neurological:     General: No focal deficit present.     Mental Status: She is alert and oriented to person, place, and time.  Psychiatric:        Mood and Affect: Mood normal.        Behavior: Behavior normal.     Recent Results (from  the past 2160 hour(s))  POC urine preg, ED (not at [facility])     Status: None   Collection Time: 08/02/22  9:26 PM  Result Value Ref Range   Preg Test, Ur NEGATIVE NEGATIVE    Comment:        THE SENSITIVITY OF THIS METHODOLOGY IS >24 mIU/mL   Cervicovaginal ancillary only     Status: Abnormal   Collection Time: 08/02/22  9:28 PM  Result Value Ref Range   Neisseria Gonorrhea Negative    Chlamydia Negative    Trichomonas Negative    Bacterial Vaginitis (gardnerella) Negative    Candida Vaginitis Positive (A)    Candida Glabrata Positive (A)    Comment      Normal Reference Range Bacterial Vaginosis - Negative   Comment Normal Reference Range Candida Species - Negative    Comment Normal Reference Range Candida Galbrata - Negative    Comment Normal Reference Range Trichomonas - Negative    Comment Normal Reference Ranger Chlamydia - Negative    Comment      Normal Reference Range Neisseria Gonorrhea - Negative      Assessment and Plan :   PDMP not reviewed this encounter.  1. Yeast vaginitis     Patient was seen 4 days ago and reviewed the results of Candida vaginitis and Candida Glabrata.  I advised that we could do a completely different regimen to treat the latter type of infection.  Patient refused this.  She would like to trial the previous antifungal creams that she is used and therefore recommended clotrimazole which she has done and is confirmed through her chart.  I did recommend trying more oral fluconazole. Counseled patient on potential for adverse effects with medications prescribed/recommended today, ER and return-to-clinic precautions discussed, patient verbalized understanding.    Wallis Bamberg, PA-C 08/07/22 1243

## 2022-08-20 NOTE — L&D Delivery Note (Signed)
Delivery Note ROM x 4h 18m Pt reporting involuntary urge to push at ~1200. Still 6 cm, suspected LOP. Encouraged pt to limit active pushing until cervix fully dilated. Pushed x 1 hr.   At 2:16 PM a viable and healthy female was delivered via Vaginal, Spontaneous (Presentation: Left Occiput Anterior).  APGAR: 6, 9; weight 5 lb 5.4 oz (2420 g). Shoulders delivered easily. Infant placed skin-to-skin w/ mom. Pitocin bolus and TXA given immediately after delivery of infant for hemorrhage prophylaxis. Delayed cord clamping x 1 minute. Cord clamped x 2 and cut by student RN. Infant to warmer for assessment by NICU team due to Preterm and FGR. Doing well., Placed back skin-to-skin w/ mom. Placenta status: Spontaneous, Intact.  Cord: 3 vessels with the following complications: None.  Cord pH: NA  Anesthesia: Epidural Episiotomy: None Lacerations: None Suture Repair:  NA Est. Blood Loss (mL):  75  Mom to postpartum.  Baby to Couplet care / Skin to Skin. Placenta to: Path Feeding: Breast Circ: IP Contraception: Undecided  [place] 05/21/2023, 3:18 PM

## 2022-10-05 DIAGNOSIS — E1165 Type 2 diabetes mellitus with hyperglycemia: Secondary | ICD-10-CM | POA: Diagnosis not present

## 2022-10-05 DIAGNOSIS — Z880 Allergy status to penicillin: Secondary | ICD-10-CM | POA: Diagnosis not present

## 2022-10-05 DIAGNOSIS — B3731 Acute candidiasis of vulva and vagina: Secondary | ICD-10-CM | POA: Diagnosis not present

## 2022-10-05 DIAGNOSIS — R739 Hyperglycemia, unspecified: Secondary | ICD-10-CM | POA: Diagnosis not present

## 2022-10-24 ENCOUNTER — Emergency Department (HOSPITAL_COMMUNITY)
Admission: EM | Admit: 2022-10-24 | Discharge: 2022-10-25 | Disposition: A | Discharge status: Home / Self Care | Payer: Medicaid Other | Attending: Emergency Medicine | Admitting: Emergency Medicine

## 2022-10-24 ENCOUNTER — Encounter (HOSPITAL_COMMUNITY): Payer: Self-pay

## 2022-10-24 ENCOUNTER — Other Ambulatory Visit: Payer: Self-pay

## 2022-10-24 ENCOUNTER — Emergency Department (HOSPITAL_COMMUNITY): Payer: Medicaid Other

## 2022-10-24 DIAGNOSIS — Z3A01 Less than 8 weeks gestation of pregnancy: Secondary | ICD-10-CM | POA: Diagnosis not present

## 2022-10-24 DIAGNOSIS — R739 Hyperglycemia, unspecified: Secondary | ICD-10-CM

## 2022-10-24 DIAGNOSIS — O26891 Other specified pregnancy related conditions, first trimester: Secondary | ICD-10-CM | POA: Insufficient documentation

## 2022-10-24 DIAGNOSIS — O24419 Gestational diabetes mellitus in pregnancy, unspecified control: Secondary | ICD-10-CM | POA: Diagnosis not present

## 2022-10-24 DIAGNOSIS — R1032 Left lower quadrant pain: Secondary | ICD-10-CM | POA: Diagnosis not present

## 2022-10-24 DIAGNOSIS — O208 Other hemorrhage in early pregnancy: Secondary | ICD-10-CM | POA: Diagnosis not present

## 2022-10-24 DIAGNOSIS — Z91040 Latex allergy status: Secondary | ICD-10-CM | POA: Insufficient documentation

## 2022-10-24 DIAGNOSIS — Z3491 Encounter for supervision of normal pregnancy, unspecified, first trimester: Secondary | ICD-10-CM

## 2022-10-24 DIAGNOSIS — E1165 Type 2 diabetes mellitus with hyperglycemia: Secondary | ICD-10-CM | POA: Diagnosis not present

## 2022-10-24 DIAGNOSIS — Z7984 Long term (current) use of oral hypoglycemic drugs: Secondary | ICD-10-CM | POA: Diagnosis not present

## 2022-10-24 LAB — URINALYSIS, ROUTINE W REFLEX MICROSCOPIC
Bacteria, UA: NONE SEEN
Bilirubin Urine: NEGATIVE
Glucose, UA: >=500 mg/dL — AB
Hgb urine dipstick: NEGATIVE
Ketones, ur: 5 mg/dL — AB
Leukocytes,Ua: NEGATIVE
Nitrite: NEGATIVE
Protein, ur: NEGATIVE mg/dL
Specific Gravity, Urine: 1.032 — ABNORMAL HIGH (ref 1.005–1.030)
pH: 6 (ref 5.0–8.0)

## 2022-10-24 LAB — CBC WITH DIFFERENTIAL/PLATELET
Abs Immature Granulocytes: 0.03 10*3/uL (ref 0.00–0.07)
Basophils Absolute: 0 10*3/uL (ref 0.0–0.1)
Basophils Relative: 0 %
Eosinophils Absolute: 0.1 10*3/uL (ref 0.0–0.5)
Eosinophils Relative: 1 %
HCT: 40.3 % (ref 36.0–46.0)
Hemoglobin: 13.5 g/dL (ref 12.0–15.0)
Immature Granulocytes: 0 %
Lymphocytes Relative: 38 %
Lymphs Abs: 3.5 10*3/uL (ref 0.7–4.0)
MCH: 28.1 pg (ref 26.0–34.0)
MCHC: 33.5 g/dL (ref 30.0–36.0)
MCV: 84 fL (ref 80.0–100.0)
Monocytes Absolute: 0.5 10*3/uL (ref 0.1–1.0)
Monocytes Relative: 5 %
Neutro Abs: 5.2 10*3/uL (ref 1.7–7.7)
Neutrophils Relative %: 56 %
Platelets: 147 10*3/uL — ABNORMAL LOW (ref 150–400)
RBC: 4.8 MIL/uL (ref 3.87–5.11)
RDW: 14.3 % (ref 11.5–15.5)
WBC: 9.2 10*3/uL (ref 4.0–10.5)
nRBC: 0 % (ref 0.0–0.2)

## 2022-10-24 LAB — HCG, QUANTITATIVE, PREGNANCY: hCG, Beta Chain, Quant, S: 7850 m[IU]/mL — ABNORMAL HIGH (ref <5)

## 2022-10-24 LAB — COMPREHENSIVE METABOLIC PANEL
ALT: 15 U/L (ref 0–44)
AST: 19 U/L (ref 15–41)
Albumin: 3.5 g/dL (ref 3.5–5.0)
Alkaline Phosphatase: 51 U/L (ref 38–126)
Anion gap: 9 (ref 5–15)
BUN: 14 mg/dL (ref 6–20)
CO2: 22 mmol/L (ref 22–32)
Calcium: 8.5 mg/dL — ABNORMAL LOW (ref 8.9–10.3)
Chloride: 100 mmol/L (ref 98–111)
Creatinine, Ser: 0.93 mg/dL (ref 0.44–1.00)
GFR, Estimated: >60 mL/min (ref >60)
Glucose, Bld: 373 mg/dL — ABNORMAL HIGH (ref 70–99)
Potassium: 3.5 mmol/L (ref 3.5–5.1)
Sodium: 131 mmol/L — ABNORMAL LOW (ref 135–145)
Total Bilirubin: 0.4 mg/dL (ref 0.3–1.2)
Total Protein: 6.9 g/dL (ref 6.5–8.1)

## 2022-10-24 LAB — LIPASE, BLOOD: Lipase: 36 U/L (ref 11–51)

## 2022-10-24 MED ORDER — PRENATAL COMPLETE 14-0.4 MG PO TABS: 1.0000 | ORAL_TABLET | Freq: Every day | ORAL | 0 refills | Status: DC

## 2022-10-24 MED ORDER — ACETAMINOPHEN 325 MG PO TABS
650.0000 mg | ORAL_TABLET | Freq: Once | ORAL | Status: AC
  Administered 2022-10-24: 650 mg via ORAL
  Filled 2022-10-24: qty 2

## 2022-10-24 MED ORDER — SODIUM CHLORIDE 0.9 % IV BOLUS
1000.0000 mL | Freq: Once | INTRAVENOUS | Status: AC
  Administered 2022-10-24: 1000 mL via INTRAVENOUS

## 2022-10-24 NOTE — ED Provider Notes (Signed)
[place] EMERGENCY DEPARTMENT AT [facility] Provider Note   CSN: [id number] Arrival date & time: 10/24/22  2105     History  Chief Complaint  Patient presents with   Abdominal Pain    Monique Schmidt is a 37 y.o. female.  Patient is a 37 year old female with a past medical history of gestational diabetes presenting to the emergency department with abdominal pain.  Patient states for the last 3 days she has had left lower quadrant abdominal pain.  She states that it feels like a cramping type of pain and comes and goes.  She states nothing in particular brings the pain on or makes the pain go away.  She states that she did take a home pregnancy test that was positive.  She states that her LMP was the end of January.  She denies any abnormal vaginal bleeding or discharge.  She denies any dysuria or hematuria, fevers or chills, nausea, vomiting, diarrhea or constipation.  He denies any history of ectopic pregnancy.  The history is provided by the patient.  Abdominal Pain      Home Medications Prior to Admission medications   Medication Sig Start Date End Date Taking? Authorizing Provider  Accu-Chek Softclix Lancets lancets Use four times daily as instructed. 01/11/22   Griffin Basil, MD  albuterol (VENTOLIN HFA) 108 (90 Base) MCG/ACT inhaler Inhale 1-2 puffs into the lungs every 6 (six) hours as needed for wheezing or shortness of breath. 03/09/22   Jaynee Eagles, PA-C  cetirizine (ZYRTEC ALLERGY) 10 MG tablet Take 1 tablet (10 mg total) by mouth daily. 03/09/22   Jaynee Eagles, PA-C  clotrimazole (GYNE-LOTRIMIN) 1 % vaginal cream Place 1 Applicatorful vaginally at bedtime. 08/07/22   Jaynee Eagles, PA-C  fluconazole (DIFLUCAN) 150 MG tablet Take 1 tablet (150 mg total) by mouth every 3 (three) days. 08/07/22   Jaynee Eagles, PA-C  glucose blood test strip Use as instructed 01/11/22   Griffin Basil, MD  metFORMIN (GLUCOPHAGE) 500 MG tablet Take 1 tablet (500 mg total) by mouth  2 (two) times daily with a meal. 07/25/21 08/24/21  Ward, Lenise Arena, PA-C  Prenatal Vit-Fe Fumarate-FA (PRENATAL COMPLETE) 14-0.4 MG TABS Take 1 tablet by mouth daily. 10/24/22  Yes Leanord Asal K, DO  medroxyPROGESTERone (PROVERA) 10 MG tablet Take 1 tablet (10 mg total) by mouth daily. 06/24/19 08/18/19  Donnamae Jude, MD      Allergies    Latex and Penicillins    Review of Systems   Review of Systems  Gastrointestinal:  Positive for abdominal pain.    Physical Exam Updated Vital Signs BP (!) 144/113 (BP Location: Left Arm)   Pulse 81   Temp (!) 97.4 F (36.3 C) (Oral)   Resp 18   Ht '5\' 6"'$  (1.676 m)   Wt 127 kg   LMP 09/09/2022 (Approximate)   SpO2 100%   BMI 45.19 kg/m  Physical Exam Vitals and nursing note reviewed.  Constitutional:      General: She is not in acute distress.    Appearance: She is well-developed. She is obese.  HENT:     Head: Normocephalic and atraumatic.     Mouth/Throat:     Mouth: Mucous membranes are moist.     Pharynx: Oropharynx is clear.  Eyes:     Extraocular Movements: Extraocular movements intact.  Cardiovascular:     Rate and Rhythm: Normal rate and regular rhythm.     Heart sounds: Normal heart sounds.  Pulmonary:  Effort: Pulmonary effort is normal.     Breath sounds: Normal breath sounds.  Abdominal:     General: Abdomen is flat.     Palpations: Abdomen is soft.     Tenderness: There is abdominal tenderness (Mild) in the left lower quadrant. There is no guarding or rebound.  Skin:    General: Skin is warm and dry.  Neurological:     General: No focal deficit present.     Mental Status: She is alert and oriented to person, place, and time.  Psychiatric:        Mood and Affect: Mood normal.        Behavior: Behavior normal.     ED Results / Procedures / Treatments   Labs (all labs ordered are listed, but only abnormal results are displayed) Labs Reviewed  HCG, QUANTITATIVE, PREGNANCY - Abnormal; Notable for the  following components:      Result Value   hCG, Beta Chain, Quant, S 7,850 (*)    All other components within normal limits  CBC WITH DIFFERENTIAL/PLATELET - Abnormal; Notable for the following components:   Platelets 147 (*)    All other components within normal limits  COMPREHENSIVE METABOLIC PANEL - Abnormal; Notable for the following components:   Sodium 131 (*)    Glucose, Bld 373 (*)    Calcium 8.5 (*)    All other components within normal limits  URINALYSIS, ROUTINE W REFLEX MICROSCOPIC - Abnormal; Notable for the following components:   Color, Urine STRAW (*)    Specific Gravity, Urine 1.032 (*)    Glucose, UA >=500 (*)    Ketones, ur 5 (*)    All other components within normal limits  LIPASE, BLOOD    EKG None  Radiology No results found.  Procedures Procedures    Medications Ordered in ED Medications  acetaminophen (TYLENOL) tablet 650 mg (650 mg Oral Given 10/24/22 2335)    ED Course/ Medical Decision Making/ A&P Clinical Course as of 10/24/22 2343  Wed Oct 24, 2022  2343 Patient signed out to Dr. Wyvonnia Dusky pending [place]. [VK]    Clinical Course User Index [VK] Kemper Durie, DO                             Medical Decision Making This patient presents to the ED with chief complaint(s) of abdominal pain with pertinent past medical history of gestational diabetes which further complicates the presenting complaint. The complaint involves an extensive differential diagnosis and also carries with it a high risk of complications and morbidity.    The differential diagnosis includes pregnancy, ectopic, UTI, diverticulitis or other intra-abdominal infection less likely as she has no point tenderness and no other associated symptoms, STI or PID less likely as she denies any abnormal discharge  Additional history obtained: Additional history obtained from N/A Records reviewed Care Everywhere/External Records  ED Course and Reassessment: Patient was initially  evaluated by provider in triage and had low urine, labs including hCG quant performed.  Independent labs interpretation:  The following labs were independently interpreted: positive HCG, hyperglycemia, otherwise within normal range  Independent visualization of imaging: -Pending     Amount and/or Complexity of Data Reviewed Labs: ordered. Radiology: ordered.          Final Clinical Impression(s) / ED Diagnoses Final diagnoses:  Pregnant and not yet delivered in first trimester  Hyperglycemia    Rx / DC Orders ED Discharge Orders  Ordered    Prenatal Vit-Fe Fumarate-FA (PRENATAL COMPLETE) 14-0.4 MG TABS  Daily        10/24/22 2341              Kemper Durie, DO 10/24/22 2343

## 2022-10-24 NOTE — ED Triage Notes (Signed)
Pt reports intermittent lower left abdominal pain x 2 days. Denies pain/burning with urination. Pt does states that she took 2 home pregnancy tests that were positive.

## 2022-10-24 NOTE — Discharge Instructions (Signed)
You were seen in the emergency department for your abdominal pain. Your pregnancy test was positive and your ultrasound did show that your pregnancy is in your uterus. You can follow up with OB to have your symptoms rechecked and for further management of your pregnancy. Your blood sugar was high here as well and you should follow up with your OB for blood sugar management in your pregnancy. You should return to the emergency department for significantly worsening pain, vaginal bleeding through more than one pad per hour or if you have any other new or concerning symptoms.

## 2022-10-25 ENCOUNTER — Telehealth: Payer: Self-pay | Admitting: General Practice

## 2022-10-25 DIAGNOSIS — R739 Hyperglycemia, unspecified: Secondary | ICD-10-CM

## 2022-10-25 DIAGNOSIS — O3680X0 Pregnancy with inconclusive fetal viability, not applicable or unspecified: Secondary | ICD-10-CM

## 2022-10-25 MED ORDER — BLOOD GLUCOSE MONITOR KIT: PACK | 0 refills | Status: DC

## 2022-10-25 MED ORDER — METFORMIN HCL 500 MG PO TABS: 500.0000 mg | ORAL_TABLET | Freq: Two times a day (BID) | ORAL | 0 refills | Status: DC

## 2022-10-25 MED ORDER — NIFEDIPINE ER OSMOTIC RELEASE 30 MG PO TB24: 30.0000 mg | ORAL_TABLET | Freq: Every day | ORAL | 0 refills | Status: DC

## 2022-10-25 NOTE — Progress Notes (Signed)
   OB/GYN Telephone Consult  10/25/2022   Monique Schmidt is a 37 y.o. [id number] who is currently pregnant presenting to [facility] ED  I was called for a consult regarding the care of this patient by the Physician (MD/DO) caring for the patient.   The provider had the following clinical question: Pregnancy, elevated CBG and elevated BP  The provider presented the following relevant clinical information: U/s shows SIUP with yolk sac, no fetal pole  I performed a chart review on the patient and reviewed available documentation.  BP (!) 144/113 (BP Location: Left Arm)   Pulse 81   Temp (!) 97.4 F (36.3 C) (Oral)   Resp 18   Ht '5\' 6"'$  (1.676 m)   Wt 127 kg   LMP 09/09/2022 (Approximate)   SpO2 100%   BMI 45.19 kg/m   Exam- performed by consulting provider   Recommendations:  -f/u u/s in 10 days. Presence of yolk sac means IUP and further HCGs are not necessary -Metformin 500 mg BID to start -Procardia XL 30 mg daily  -Recommended MD/APP provide the patient with a referral to the [facility] (any office) for follow up in  1 week.   Thank you for this consult and if additional recommendations are needed please call [phone] for the OB/GYN attending on service at [facility].   I spent approximately 10 minutes directly consulting with the provider and verbally discussing this case. Additionally 10 minutes minutes was spent performing chart review and documentation.    Donnamae Jude, MD

## 2022-10-25 NOTE — ED Provider Notes (Signed)
Care Assumed from Dr. Maylon Peppers.  Patient with history of diabetes not on medications here with left-sided lower abdominal pain and positive pregnancy test.  Awaiting ultrasound.  Ultrasound shows intrauterine gestational sac but no fetal pole.  Small subchorionic hemorrhage.  hCG 7800.  Patient hyperglycemic to 373 with no anion gap.  She states she is not taking anything for her diabetes and states she only has "gestational diabetes".  Discussed with her obstetrician Dr. Kennon Rounds who is incidentally in the hospital today.  Dr. Kennon Rounds states patient is indeed diabetic with A1c greater than 12.  She recommends initiating metformin and glucometer.  Will need repeat ultrasound in 10 days.  No need for repeat hCG test.  Per Dr. Kennon Rounds: Recommendations:  -f/u u/s in 10 days. Presence of yolk sac means IUP and further HCGs are not necessary -Metformin 500 mg BID to start -Procardia XL 30 mg daily  Will give Patient glucometer.  Follow-up for repeat ultrasound in 10 days.  Return precaution discussed   Monique Essex, MD 10/25/22 [phone]

## 2022-10-25 NOTE — Telephone Encounter (Signed)
Per Dr Kennon Rounds, patient needs viability ultrasound in 7-10 days. Scheduled for 3/19 @ 1pm. Called and informed patient. Patient verbalized understanding.

## 2022-10-30 ENCOUNTER — Other Ambulatory Visit: Payer: Self-pay | Admitting: *Deleted

## 2022-10-30 DIAGNOSIS — O0990 Supervision of high risk pregnancy, unspecified, unspecified trimester: Secondary | ICD-10-CM

## 2022-10-30 DIAGNOSIS — O24419 Gestational diabetes mellitus in pregnancy, unspecified control: Secondary | ICD-10-CM

## 2022-11-06 ENCOUNTER — Other Ambulatory Visit: Payer: Medicaid Other

## 2022-11-06 ENCOUNTER — Ambulatory Visit: Admission: RE | Admit: 2022-11-06 | Payer: Medicaid Other | Source: Ambulatory Visit

## 2022-11-06 NOTE — Progress Notes (Deleted)
Lab Results  Component Value Date   HGBA1C 12.9 (H) 01/08/2022   Current Outpatient Medications on File Prior to Visit  Medication Sig Dispense Refill   Accu-Chek Softclix Lancets lancets Use four times daily as instructed. 100 each 12   albuterol (VENTOLIN HFA) 108 (90 Base) MCG/ACT inhaler Inhale 1-2 puffs into the lungs every 6 (six) hours as needed for wheezing or shortness of breath. 18 g 0   blood glucose meter kit and supplies KIT Dispense based on patient and insurance preference. Use up to four times daily as directed. 1 each 0   cetirizine (ZYRTEC ALLERGY) 10 MG tablet Take 1 tablet (10 mg total) by mouth daily. 30 tablet 0   clotrimazole (GYNE-LOTRIMIN) 1 % vaginal cream Place 1 Applicatorful vaginally at bedtime. 45 g 1   fluconazole (DIFLUCAN) 150 MG tablet Take 1 tablet (150 mg total) by mouth every 3 (three) days. 5 tablet 0   glucose blood test strip Use as instructed 100 each 12   metFORMIN (GLUCOPHAGE) 500 MG tablet Take 1 tablet (500 mg total) by mouth 2 (two) times daily with a meal. 60 tablet 0   NIFEdipine (PROCARDIA XL) 30 MG 24 hr tablet Take 1 tablet (30 mg total) by mouth daily. 30 tablet 0   Prenatal Vit-Fe Fumarate-FA (PRENATAL COMPLETE) 14-0.4 MG TABS Take 1 tablet by mouth daily. 60 tablet 0   Next Ob visit 11/19/22 Roselie Awkward

## 2022-11-09 ENCOUNTER — Telehealth: Payer: Self-pay | Admitting: Family Medicine

## 2022-11-09 DIAGNOSIS — O24319 Unspecified pre-existing diabetes mellitus in pregnancy, unspecified trimester: Secondary | ICD-10-CM

## 2022-11-09 DIAGNOSIS — O0990 Supervision of high risk pregnancy, unspecified, unspecified trimester: Secondary | ICD-10-CM

## 2022-11-09 NOTE — Telephone Encounter (Signed)
Patient request Prenatal Vitamins and another Meter she said it broke

## 2022-11-13 MED ORDER — ACCU-CHEK GUIDE VI STRP: ORAL_STRIP | 12 refills | Status: DC

## 2022-11-13 MED ORDER — ACCU-CHEK SOFTCLIX LANCETS MISC: 12 refills | Status: DC

## 2022-11-13 MED ORDER — ACCU-CHEK GUIDE W/DEVICE KIT: 1.0000 | PACK | Freq: Once | 0 refills | Status: AC

## 2022-11-13 NOTE — Telephone Encounter (Signed)
Per chart review patient has new ob visit 11/19/22 and [facility] education appt. I called her and she states she needs RX for glucose meter . I asked where she got meter because she has not been seen here yet. She states she got at hospital but she lost it.  I explained I can send in a new meter but insurance may not cover it since one was filled recently. I encouraged her to try to find her meter. New rx sent in. I also explained I see that she missed her diabetes education appointment and I can reschedule. She agreed to 11/15/22. I also reviewed her new ob appointment. I also send in RX for PNV per her request. Staci Acosta

## 2022-11-15 ENCOUNTER — Other Ambulatory Visit: Payer: Self-pay

## 2022-11-15 ENCOUNTER — Ambulatory Visit (INDEPENDENT_AMBULATORY_CARE_PROVIDER_SITE_OTHER): Payer: Medicaid Other | Admitting: Registered"

## 2022-11-15 ENCOUNTER — Encounter: Payer: Medicaid Other | Attending: Family Medicine | Admitting: Registered"

## 2022-11-15 DIAGNOSIS — O0991 Supervision of high risk pregnancy, unspecified, first trimester: Secondary | ICD-10-CM | POA: Diagnosis not present

## 2022-11-15 DIAGNOSIS — Z713 Dietary counseling and surveillance: Secondary | ICD-10-CM | POA: Insufficient documentation

## 2022-11-15 DIAGNOSIS — Z3A09 9 weeks gestation of pregnancy: Secondary | ICD-10-CM | POA: Diagnosis not present

## 2022-11-15 DIAGNOSIS — E1165 Type 2 diabetes mellitus with hyperglycemia: Secondary | ICD-10-CM

## 2022-11-15 DIAGNOSIS — O24419 Gestational diabetes mellitus in pregnancy, unspecified control: Secondary | ICD-10-CM | POA: Diagnosis not present

## 2022-11-15 DIAGNOSIS — Z794 Long term (current) use of insulin: Secondary | ICD-10-CM

## 2022-11-15 MED ORDER — METFORMIN HCL ER 500 MG PO TB24: 500.0000 mg | ORAL_TABLET | Freq: Two times a day (BID) | ORAL | 2 refills | Status: DC

## 2022-11-15 NOTE — Progress Notes (Signed)
Patient was seen for Diabetes in pregnancy self-management on 11/15/22  Start time 1428 and End time 1505   Estimated due date: ??; ~9 weeks (pt reported) Pt states she is having twins  new ob visit 11/19/22 with Dr. Roselie Awkward  Clinical: Medications: Metformin 500 bid (pt taking, has GI side effects) Prenatal vitamins not picked up due to recall at pharmacy Medical History: GDM will all pregnancies, has used insulin Labs:  Lab Results  Component Value Date   HGBA1C 12.9 (H) 01/08/2022     Dietary and Lifestyle History: Pt reports frequent urination and increased thirst. Pt states lost meter and has not checked BG in 3 weeks, thinks last reading was in the 300s. Gave patient a voucher for a free Accu chek meter to take to pharmacy when she picks up meter Rx.  Pt reports BM urgency with Metformin with both after breakfast and in the evening during the night. Pt states she is taking with food. Pt is interested in trying extended release  Pt states she has taken metformin with prior pregnancies as well as 2 types of insulin. Showed patient Omnipod and briefly discussed potential use in the future.  Pt states she is concerned about eating pickles due to having to take blood pressure medication.  Pt reports she likes a variety of vegetables. Pt states she uses 100% whole wheat, is eating small portions of fruit. Drinking mostly just plain water. Pt states she was told to drink milk but she is afraid it is going to make her throw up. Pt states in previous pregnancy she ate more cereal and milk.  Pt states she usually fixes dinner by 5-6 pm  Physical Activity: not assessed Stress: not assessed Sleep: not assessed  24 hr Recall:  First Meal: 3 eggs, cheese, whole wheat toast, water Snack: fruit Second meal: [place] and cheese sandwich, fruit or lays Snack: Third meal: not assessed Snack: Beverages: 8 bottles water, zero sugar flavor packets  NUTRITION INTERVENTION  Nutrition education  (E-1) on the following topics:   Initial Follow-up  []  []  Definition of Gestational Diabetes []  []  Why dietary management is important in controlling blood glucose []  []  Effects each nutrient has on blood glucose levels []  []  Simple carbohydrates vs complex carbohydrates []  []  Fluid intake [x]  []  Creating a balanced meal plan []  []  Carbohydrate counting  [x]  []  When to check blood glucose levels []  []  Proper blood glucose monitoring techniques []  []  Effect of stress and stress reduction techniques  []  []  Exercise effect on blood glucose levels, appropriate exercise during pregnancy []  []  Importance of limiting caffeine and abstaining from alcohol and smoking [x]  []  Medications used for blood sugar control during pregnancy []  []  Hypoglycemia and rule of 15 []  []  Postpartum self care  Patient lost a meter prior to visit. Plans to go to pharmacy to pick up new one.  Patient instructed to monitor glucose levels: FBS: 60 - ? 95 mg/dL; 2 hour: ? 120 mg/dL  Patient received handouts: Nutrition Diabetes and Pregnancy Carbohydrate Counting List  Patient will be seen for follow-up as needed.

## 2022-11-15 NOTE — Progress Notes (Signed)
Metformin reordered at same dose in extended release form due to GI side effects of originally ordered Metformin.

## 2022-11-19 ENCOUNTER — Other Ambulatory Visit (HOSPITAL_COMMUNITY)
Admission: RE | Admit: 2022-11-19 | Discharge: 2022-11-19 | Disposition: A | Discharge status: Home / Self Care | Payer: Medicaid Other | Source: Ambulatory Visit | Attending: Obstetrics & Gynecology | Admitting: Obstetrics & Gynecology

## 2022-11-19 ENCOUNTER — Other Ambulatory Visit: Payer: Self-pay

## 2022-11-19 ENCOUNTER — Encounter: Payer: Self-pay | Admitting: Obstetrics & Gynecology

## 2022-11-19 ENCOUNTER — Ambulatory Visit (INDEPENDENT_AMBULATORY_CARE_PROVIDER_SITE_OTHER): Payer: Medicaid Other | Admitting: Obstetrics & Gynecology

## 2022-11-19 VITALS — BP 110/77 | HR 94 | Wt 293.5 lb

## 2022-11-19 DIAGNOSIS — Z794 Long term (current) use of insulin: Secondary | ICD-10-CM

## 2022-11-19 DIAGNOSIS — Z8632 Personal history of gestational diabetes: Secondary | ICD-10-CM

## 2022-11-19 DIAGNOSIS — E1165 Type 2 diabetes mellitus with hyperglycemia: Secondary | ICD-10-CM

## 2022-11-19 DIAGNOSIS — O0990 Supervision of high risk pregnancy, unspecified, unspecified trimester: Secondary | ICD-10-CM

## 2022-11-19 DIAGNOSIS — O24019 Pre-existing diabetes mellitus, type 1, in pregnancy, unspecified trimester: Secondary | ICD-10-CM

## 2022-11-19 DIAGNOSIS — O1092 Unspecified pre-existing hypertension complicating childbirth: Secondary | ICD-10-CM

## 2022-11-19 DIAGNOSIS — O0991 Supervision of high risk pregnancy, unspecified, first trimester: Secondary | ICD-10-CM

## 2022-11-19 DIAGNOSIS — O10911 Unspecified pre-existing hypertension complicating pregnancy, first trimester: Secondary | ICD-10-CM | POA: Diagnosis not present

## 2022-11-19 DIAGNOSIS — D563 Thalassemia minor: Secondary | ICD-10-CM

## 2022-11-19 DIAGNOSIS — O09521 Supervision of elderly multigravida, first trimester: Secondary | ICD-10-CM

## 2022-11-19 DIAGNOSIS — O24011 Pre-existing diabetes mellitus, type 1, in pregnancy, first trimester: Secondary | ICD-10-CM

## 2022-11-19 DIAGNOSIS — Z3A10 10 weeks gestation of pregnancy: Secondary | ICD-10-CM

## 2022-11-19 DIAGNOSIS — O09529 Supervision of elderly multigravida, unspecified trimester: Secondary | ICD-10-CM

## 2022-11-19 MED ORDER — VITAFOL GUMMIES 3.33-0.333-34.8 MG PO CHEW: 3.0000 | CHEWABLE_TABLET | Freq: Every day | ORAL | 11 refills | Status: DC

## 2022-11-19 MED ORDER — INSULIN NPH (HUMAN) (ISOPHANE) 100 UNIT/ML ~~LOC~~ SUSP
20.0000 [IU] | Freq: Two times a day (BID) | SUBCUTANEOUS | 3 refills | Status: DC
Start: 2022-11-19 — End: 2022-12-13

## 2022-11-19 MED ORDER — BLOOD PRESSURE KIT DEVI: 1.0000 | Freq: Once | 0 refills | Status: AC

## 2022-11-19 NOTE — Progress Notes (Unsigned)
Subjective:    Monique Schmidt is a [id number] [redacted]w[redacted]d being seen today for her first obstetrical visit.  Her obstetrical history is significant for {ob risk factors:10154}. Patient {does/does not:19097} intend to breast feed. Pregnancy history fully reviewed.  Patient reports {sx:14538}.  Vitals:   11/19/22 1542 11/19/22 1551  BP: (!) 144/84 110/77  Pulse: 94   Weight: 293 lb 8 oz (133.1 kg)     HISTORY: OB History  Gravida Para Term Preterm AB Living  7 5 5   1 5   SAB IAB Ectopic Multiple Live Births  1     0 5    # Outcome Date GA Lbr Len/2nd Weight Sex Delivery Anes PTL Lv  7 Current           6 Term 01/20/16 [redacted]w[redacted]d 00:55 / 00:08 9 lb 11.7 oz (4.414 kg) F Vag-Spont None  LIV     Birth Comments: none  5 Term 12/15/13 [redacted]w[redacted]d 06:43 / 00:02 8 lb 13.8 oz (4.02 kg) F Vag-Spont None  LIV  4 Term 06/26/12 [redacted]w[redacted]d 21:07 / 00:01 7 lb 2 oz (3.232 kg) F Vag-Spont None  LIV  3 Term 03/08/11 [redacted]w[redacted]d 01:27 / 00:02 8 lb 9 oz (3.884 kg) F Vag-Spont None  LIV     Birth Comments: none  2 Term 05/19/09 [redacted]w[redacted]d   F Vag-Spont None N LIV  1 SAB            Past Medical History:  Diagnosis Date   Abnormal Pap smear    Bacterial vaginosis    Gestational diabetes    likely pre-exisiting; all pregnancies glyburide started 11/4   Gestational thrombocytopenia    Headache(784.0)    Hernia, umbilical    History of abnormal cervical Pap smear    normal 2014   Mild preeclampsia delivered 01/22/2016   Type 2 diabetes mellitus    Past Surgical History:  Procedure Laterality Date   UMBILICAL HERNIA REPAIR N/A 11/22/2014   Procedure: LAPAROSCOPIC UMBILICAL HERNIA;  Surgeon: Ralene Ok, MD;  Location: [facility] OR;  Service: General;  Laterality: N/A;   WISDOM TOOTH EXTRACTION     Family History  Problem Relation Age of Onset   Hypertension Father    Diabetes Father    Diabetes Mother    Seizures Brother      Exam    Uterus:     Pelvic Exam:    Perineum: {Exam; anus and perineum:14598}   Vulva: {vulva  exam:14487}   Vagina:  {vaginal exam:14498}   pH: ***   Cervix: {cervix exam:14595}   Adnexa: {adnexa:12223}   Bony Pelvis: {desc; pelvis:14491}  System: Breast:  {Exam; breast:13139::"normal appearance, no masses or tenderness"}   Skin: {pe skin brief OQ:3024656    Neurologic: {Exam; neuro:16375}   Extremities: {Exam; extremities:15096}   HEENT {exam; XT:2158142   Mouth/Teeth {pe mouth simple ob:314450}   Neck {Neck (ob):32092}   Cardiovascular: {HEART EXAM HEM/ONC:21750}   Respiratory:  {Exam; respiratory:5782}   Abdomen: {Exam; abdomen:16834}   Urinary: {exam; urinary female:30845}      Assessment:    Pregnancy: [id number] Patient Active Problem List   Diagnosis Date Noted   Supervision of high risk pregnancy, antepartum 11/19/2022   Acute respiratory failure with hypoxia    COVID-19 03/31/2020   Cervical high risk HPV (human papillomavirus) test positive 06/29/2019   History of gestational diabetes 06/26/2019   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   BMI 50.0-59.9, adult ([place]) 123XX123   Obesity complicating pregnancy 123XX123   Moderate persistent asthma 08/07/2013  Diabetes mellitus 04/16/2011        Plan:     Initial labs drawn. Prenatal vitamins. Problem list reviewed and updated. Genetic Screening discussed {GENETIC SCREENING TEST:22046}: {requests/ordered/declines:14581}.  Ultrasound discussed; fetal survey: {requests/ordered/declines:14581}.  Follow up in {numbers 0-4:31231} weeks. ***% of *** min visit spent on counseling and coordination of care.  ***   Emeterio Reeve 11/19/2022

## 2022-11-19 NOTE — Patient Instructions (Signed)
[facility] [address], [place], [place] [address] [phone] Hours: Sunday Closed Monday 9AM-6PM Tuesday 9AM-6PM Wednesday 9AM-6PM Thursday 9AM-6PM Friday           9AM-6PM Saturday         10AM-1PM  

## 2022-11-20 LAB — COMPREHENSIVE METABOLIC PANEL
ALT: 21 IU/L (ref 0–32)
AST: 19 IU/L (ref 0–40)
Albumin/Globulin Ratio: 1.5 (ref 1.2–2.2)
Albumin: 3.9 g/dL (ref 3.9–4.9)
Alkaline Phosphatase: 66 IU/L (ref 44–121)
BUN/Creatinine Ratio: 13 (ref 9–23)
BUN: 8 mg/dL (ref 6–20)
Bilirubin Total: 0.2 mg/dL (ref 0.0–1.2)
CO2: 22 mmol/L (ref 20–29)
Calcium: 9.5 mg/dL (ref 8.7–10.2)
Chloride: 98 mmol/L (ref 96–106)
Creatinine, Ser: 0.62 mg/dL (ref 0.57–1.00)
Globulin, Total: 2.6 g/dL (ref 1.5–4.5)
Glucose: 260 mg/dL — ABNORMAL HIGH (ref 70–99)
Potassium: 3.9 mmol/L (ref 3.5–5.2)
Sodium: 134 mmol/L (ref 134–144)
Total Protein: 6.5 g/dL (ref 6.0–8.5)
eGFR: 117 mL/min/{1.73_m2} (ref >59)

## 2022-11-20 LAB — CERVICOVAGINAL ANCILLARY ONLY
Chlamydia: NEGATIVE
Comment: NEGATIVE
Comment: NEGATIVE
Comment: NORMAL
Neisseria Gonorrhea: NEGATIVE
Trichomonas: NEGATIVE

## 2022-11-20 LAB — CBC/D/PLT+RPR+RH+ABO+RUBIGG...
Antibody Screen: NEGATIVE
Basophils Absolute: 0 10*3/uL (ref 0.0–0.2)
Basos: 0 %
EOS (ABSOLUTE): 0 10*3/uL (ref 0.0–0.4)
Eos: 1 %
HCV Ab: NONREACTIVE
HIV Screen 4th Generation wRfx: NONREACTIVE
Hematocrit: 41.9 % (ref 34.0–46.6)
Hemoglobin: 13.4 g/dL (ref 11.1–15.9)
Hepatitis B Surface Ag: NEGATIVE
Immature Grans (Abs): 0 10*3/uL (ref 0.0–0.1)
Immature Granulocytes: 0 %
Lymphocytes Absolute: 2.8 10*3/uL (ref 0.7–3.1)
Lymphs: 35 %
MCH: 27.1 pg (ref 26.6–33.0)
MCHC: 32 g/dL (ref 31.5–35.7)
MCV: 85 fL (ref 79–97)
Monocytes Absolute: 0.4 10*3/uL (ref 0.1–0.9)
Monocytes: 5 %
Neutrophils Absolute: 4.6 10*3/uL (ref 1.4–7.0)
Neutrophils: 59 %
Platelets: 168 10*3/uL (ref 150–450)
RBC: 4.95 x10E6/uL (ref 3.77–5.28)
RDW: 13.3 % (ref 11.7–15.4)
RPR Ser Ql: NONREACTIVE
Rh Factor: POSITIVE
Rubella Antibodies, IGG: 7.52 index (ref >0.99)
WBC: 7.9 10*3/uL (ref 3.4–10.8)

## 2022-11-20 LAB — PROTEIN / CREATININE RATIO, URINE
Creatinine, Urine: 52.9 mg/dL
Protein, Ur: 6.6 mg/dL
Protein/Creat Ratio: 125 mg/g creat (ref 0–200)

## 2022-11-20 LAB — HEMOGLOBIN A1C
Est. average glucose Bld gHb Est-mCnc: 318 mg/dL
Hgb A1c MFr Bld: 12.7 % — ABNORMAL HIGH (ref 4.8–5.6)

## 2022-11-20 LAB — HCV INTERPRETATION

## 2022-11-21 ENCOUNTER — Encounter: Payer: Self-pay | Admitting: *Deleted

## 2022-11-21 LAB — URINE CULTURE, OB REFLEX

## 2022-11-21 LAB — CULTURE, OB URINE

## 2022-11-26 ENCOUNTER — Other Ambulatory Visit: Payer: Self-pay | Admitting: Pharmacist

## 2022-11-26 ENCOUNTER — Encounter: Payer: Self-pay | Admitting: *Deleted

## 2022-11-26 ENCOUNTER — Telehealth: Payer: Self-pay | Admitting: Family Medicine

## 2022-11-26 NOTE — Telephone Encounter (Signed)
Patient needs authorization for medications

## 2022-11-26 NOTE — Telephone Encounter (Signed)
Patient called again and left another message she went to pick up her insulin and they said it needs a prior authorization. States she really needs her medication and asked for a call back. I called her and left a message I was returning her call and to call us back. I also talked with pharmacist in our office and she states the issue may be that her insurance does not cover flexpens. Nancy Fetter

## 2022-11-27 ENCOUNTER — Other Ambulatory Visit: Payer: Self-pay

## 2022-11-27 DIAGNOSIS — R898 Other abnormal findings in specimens from other organs, systems and tissues: Secondary | ICD-10-CM

## 2022-11-27 LAB — PANORAMA PRENATAL TEST FULL PANEL:PANORAMA TEST PLUS 5 ADDITIONAL MICRODELETIONS
FETAL FRACTION: 1.7
REPORT SUMMARY: HIGH — AB
TRIPLOIDY 13 18 RESULT TEXT: HIGH — AB

## 2022-11-27 NOTE — Telephone Encounter (Signed)
Patient concern addressed via MyChart.   Monique Schmidt

## 2022-11-28 ENCOUNTER — Telehealth: Payer: Self-pay

## 2022-11-28 DIAGNOSIS — O24019 Pre-existing diabetes mellitus, type 1, in pregnancy, unspecified trimester: Secondary | ICD-10-CM

## 2022-11-28 DIAGNOSIS — O0990 Supervision of high risk pregnancy, unspecified, unspecified trimester: Secondary | ICD-10-CM

## 2022-11-28 DIAGNOSIS — R898 Other abnormal findings in specimens from other organs, systems and tissues: Secondary | ICD-10-CM

## 2022-11-28 MED ORDER — CYCLOBENZAPRINE HCL 10 MG PO TABS: 10.0000 mg | ORAL_TABLET | Freq: Three times a day (TID) | ORAL | 0 refills | Status: DC | PRN

## 2022-11-28 NOTE — Telephone Encounter (Addendum)
Called pt and pt informed of genetic testing results.  Pt advised that she would need a genetic counseling appt that is scheduled on April 25th at 1245 in which they will explain in detail what her results mean.  Pt verbalized understanding with no further questions.   Monique Schmidt

## 2022-11-29 ENCOUNTER — Ambulatory Visit: Payer: Medicaid Other

## 2022-11-29 ENCOUNTER — Ambulatory Visit (HOSPITAL_COMMUNITY)
Admission: RE | Admit: 2022-11-29 | Discharge: 2022-11-29 | Disposition: A | Discharge status: Home / Self Care | Payer: Medicaid Other | Source: Ambulatory Visit | Attending: Family Medicine | Admitting: Family Medicine

## 2022-11-29 DIAGNOSIS — Z3689 Encounter for other specified antenatal screening: Secondary | ICD-10-CM | POA: Diagnosis not present

## 2022-11-29 DIAGNOSIS — O3680X0 Pregnancy with inconclusive fetal viability, not applicable or unspecified: Secondary | ICD-10-CM | POA: Insufficient documentation

## 2022-11-29 DIAGNOSIS — Z3A11 11 weeks gestation of pregnancy: Secondary | ICD-10-CM | POA: Diagnosis not present

## 2022-12-01 LAB — HORIZON CUSTOM: REPORT SUMMARY: POSITIVE — AB

## 2022-12-03 ENCOUNTER — Other Ambulatory Visit: Payer: Self-pay | Admitting: Lactation Services

## 2022-12-03 MED ORDER — HUMALOG JUNIOR KWIKPEN 100 UNIT/ML ~~LOC~~ SOPN: 20.0000 [IU] | PEN_INJECTOR | Freq: Two times a day (BID) | SUBCUTANEOUS | 6 refills | Status: DC

## 2022-12-03 MED ORDER — ADVOCATE INSULIN PEN NEEDLES 33G X 4 MM MISC: 1.0000 | Freq: Two times a day (BID) | 3 refills | Status: DC

## 2022-12-03 NOTE — Progress Notes (Signed)
Changed Insulin Prescription to Humalog for Insurance coverage.

## 2022-12-04 ENCOUNTER — Telehealth: Payer: Self-pay | Admitting: Registered"

## 2022-12-04 ENCOUNTER — Other Ambulatory Visit: Payer: Self-pay | Admitting: Obstetrics & Gynecology

## 2022-12-04 ENCOUNTER — Other Ambulatory Visit: Payer: Self-pay | Admitting: Lactation Services

## 2022-12-04 ENCOUNTER — Telehealth: Payer: Self-pay | Admitting: Lactation Services

## 2022-12-04 ENCOUNTER — Other Ambulatory Visit: Payer: Medicaid Other

## 2022-12-04 MED ORDER — HUMULIN N KWIKPEN 100 UNIT/ML ~~LOC~~ SUPN: 20.0000 [IU] | PEN_INJECTOR | Freq: Two times a day (BID) | SUBCUTANEOUS | 11 refills | Status: DC

## 2022-12-04 NOTE — Telephone Encounter (Signed)
Called and spoke with Vonna Kotyk at [facility]. Asked to cancel Humalog prescription and dispense Humulin instead.

## 2022-12-04 NOTE — Telephone Encounter (Signed)
Pt had an appointment at 2:15 today to go over insulin use. LVM asking patient to call and reschedule appt and/or send me a mychart message

## 2022-12-04 NOTE — Progress Notes (Signed)
Insulin reordered as Humulin not Humalog per Heywood Bene, RD

## 2022-12-08 DIAGNOSIS — D563 Thalassemia minor: Secondary | ICD-10-CM | POA: Insufficient documentation

## 2022-12-10 ENCOUNTER — Encounter: Payer: Self-pay | Admitting: Obstetrics & Gynecology

## 2022-12-10 DIAGNOSIS — O0990 Supervision of high risk pregnancy, unspecified, unspecified trimester: Secondary | ICD-10-CM

## 2022-12-10 DIAGNOSIS — O24019 Pre-existing diabetes mellitus, type 1, in pregnancy, unspecified trimester: Secondary | ICD-10-CM

## 2022-12-10 DIAGNOSIS — O24319 Unspecified pre-existing diabetes mellitus in pregnancy, unspecified trimester: Secondary | ICD-10-CM

## 2022-12-11 DIAGNOSIS — O24119 Pre-existing diabetes mellitus, type 2, in pregnancy, unspecified trimester: Secondary | ICD-10-CM | POA: Insufficient documentation

## 2022-12-12 ENCOUNTER — Telehealth: Payer: Self-pay | Admitting: Family Medicine

## 2022-12-12 ENCOUNTER — Ambulatory Visit (INDEPENDENT_AMBULATORY_CARE_PROVIDER_SITE_OTHER): Payer: Medicaid Other | Admitting: Obstetrics & Gynecology

## 2022-12-12 ENCOUNTER — Other Ambulatory Visit: Payer: Self-pay

## 2022-12-12 VITALS — BP 125/78 | HR 89 | Wt 299.4 lb

## 2022-12-12 DIAGNOSIS — O10919 Unspecified pre-existing hypertension complicating pregnancy, unspecified trimester: Secondary | ICD-10-CM | POA: Insufficient documentation

## 2022-12-12 DIAGNOSIS — I10 Essential (primary) hypertension: Secondary | ICD-10-CM | POA: Insufficient documentation

## 2022-12-12 DIAGNOSIS — O24119 Pre-existing diabetes mellitus, type 2, in pregnancy, unspecified trimester: Secondary | ICD-10-CM

## 2022-12-12 DIAGNOSIS — O10911 Unspecified pre-existing hypertension complicating pregnancy, first trimester: Secondary | ICD-10-CM

## 2022-12-12 DIAGNOSIS — Z6841 Body Mass Index (BMI) 40.0 and over, adult: Secondary | ICD-10-CM

## 2022-12-12 DIAGNOSIS — O24111 Pre-existing diabetes mellitus, type 2, in pregnancy, first trimester: Secondary | ICD-10-CM

## 2022-12-12 DIAGNOSIS — Z3A13 13 weeks gestation of pregnancy: Secondary | ICD-10-CM

## 2022-12-12 DIAGNOSIS — O0991 Supervision of high risk pregnancy, unspecified, first trimester: Secondary | ICD-10-CM

## 2022-12-12 DIAGNOSIS — O99210 Obesity complicating pregnancy, unspecified trimester: Secondary | ICD-10-CM

## 2022-12-12 DIAGNOSIS — O0990 Supervision of high risk pregnancy, unspecified, unspecified trimester: Secondary | ICD-10-CM

## 2022-12-12 DIAGNOSIS — O99211 Obesity complicating pregnancy, first trimester: Secondary | ICD-10-CM

## 2022-12-12 MED ORDER — NIFEDIPINE ER OSMOTIC RELEASE 30 MG PO TB24
30.0000 mg | ORAL_TABLET | Freq: Every day | ORAL | 6 refills | Status: AC
Start: 2022-12-12 — End: ?
  Filled 2023-03-18: qty 30, 30d supply, fill #0
  Filled 2023-04-15 – 2023-05-14 (×3): qty 30, 30d supply, fill #1

## 2022-12-12 MED ORDER — ASPIRIN 81 MG PO TBEC
162.0000 mg | DELAYED_RELEASE_TABLET | Freq: Every day | ORAL | 2 refills | Status: AC
Start: 2022-12-12 — End: ?

## 2022-12-12 NOTE — Progress Notes (Signed)
Pt reports Vaginal odor, no itching nor irritation.

## 2022-12-12 NOTE — Progress Notes (Signed)
PRENATAL VISIT NOTE  Subjective:  Monique Schmidt is a 37 y.o. [id number] at [redacted]w[redacted]d being seen today for ongoing prenatal care.  She is currently monitored for the following issues for this high-risk pregnancy and has Moderate persistent asthma; Obesity complicating pregnancy; BMI 50.0-59.9, adult (HCC); History of gestational diabetes; Supervision of high risk pregnancy, antepartum; AMA (advanced maternal age) multigravida 35+, unspecified trimester; Alpha thalassemia silent carrier; Type 2 diabetes mellitus affecting pregnancy, antepartum; and Chronic hypertension affecting pregnancy on their problem list.  Patient reports no complaints.  Contractions: Not present. Vag. Bleeding: None.  Movement: Absent. Denies leaking of fluid.   The following portions of the patient's history were reviewed and updated as appropriate: allergies, current medications, past family history, past medical history, past social history, past surgical history and problem list.   Objective:   Vitals:   12/12/22 1330  BP: 125/78  Pulse: 89  Weight: 299 lb 6.4 oz (135.8 kg)    Fetal Status: Fetal Heart Rate (bpm): 150   Movement: Absent     General:  Alert, oriented and cooperative. Patient is in no acute distress.  Skin: Skin is warm and dry. No rash noted.   Cardiovascular: Normal heart rate noted  Respiratory: Normal respiratory effort, no problems with respiration noted  Abdomen: Soft, gravid, appropriate for gestational age.  Pain/Pressure: Absent     Pelvic: Cervical exam deferred        Extremities: Normal range of motion.  Edema: None  Mental Status: Normal mood and affect. Normal behavior. Normal judgment and thought content.   Assessment and Plan:  Pregnancy: [name] at [redacted]w[redacted]d 1. Type 2 diabetes mellitus affecting pregnancy, antepartum - Discussed diabetes maintenance with patient. - Schedule appointment with diabetes educator. Will have them look at her glucose monitor due to it not working for  past ten days. - Discussed restarting her insulin therapy once glucose monitor is corrected.   2. BMI 50.0-59.9, adult [facility]) -Discussed proper diabetes control.  3. Obesity affecting pregnancy, antepartum, unspecified obesity type - Fetus was examined with [place], and a crown lump length was obtained.  4. Supervision of high risk pregnancy, antepartum - aspirin EC 81 MG tablet; Take 2 tablets (162 mg total) by mouth daily. Start taking when you are [redacted] weeks pregnant for rest of pregnancy for prevention of preeclampsia  Dispense: 300 tablet; Refill: 2 - Ultrasound & Genetic counseling appointments on 12/13/2022.  5. Chronic hypertension affecting pregnancy - NIFEdipine (PROCARDIA XL) 30 MG 24 hr tablet; Take 1 tablet (30 mg total) by mouth daily.  Dispense: 30 tablet; Refill: 6 - aspirin EC 81 MG tablet; Take 2 tablets (162 mg total) by mouth daily. Start taking when you are [redacted] weeks pregnant for rest of pregnancy for prevention of preeclampsia  Dispense: 300 tablet; Refill: 2 - Continue to monitor for any signs of Preeclampsia.  Preterm labor symptoms and general obstetric precautions including but not limited to vaginal bleeding, contractions, leaking of fluid and fetal movement were reviewed in detail with the patient. Please refer to After Visit Summary for other counseling recommendations.   Return in about 4 weeks (around 01/09/2023).  Future Appointments  Date Time Provider Department Center  12/13/2022  1:15 PM [facility] NURSE [facility]  12/13/2022  1:30 PM [facility] US2 [facility]  12/13/2022  2:30 PM [facility] GENETIC COUNSELING [facility]    Mason Minor, Student-PA .  Attestation of Attending Supervision of PA Student: Evaluation and management procedures were performed by the PA student  under my supervision and collaboration.  I have reviewed the student's note and chart, and I agree with the management and plan.  Scheryl Darter, MD, FACOG Attending Obstetrician &  Gynecologist Faculty Practice, [facility]

## 2022-12-13 ENCOUNTER — Ambulatory Visit: Payer: Medicaid Other | Attending: Obstetrics and Gynecology

## 2022-12-13 ENCOUNTER — Encounter: Payer: Self-pay | Admitting: *Deleted

## 2022-12-13 ENCOUNTER — Encounter: Payer: Self-pay | Admitting: Registered"

## 2022-12-13 ENCOUNTER — Ambulatory Visit (HOSPITAL_BASED_OUTPATIENT_CLINIC_OR_DEPARTMENT_OTHER): Payer: Medicaid Other

## 2022-12-13 ENCOUNTER — Other Ambulatory Visit: Payer: Self-pay | Admitting: *Deleted

## 2022-12-13 ENCOUNTER — Other Ambulatory Visit (HOSPITAL_COMMUNITY): Payer: Self-pay

## 2022-12-13 ENCOUNTER — Telehealth: Payer: Self-pay | Admitting: General Practice

## 2022-12-13 ENCOUNTER — Ambulatory Visit: Payer: Medicaid Other | Admitting: *Deleted

## 2022-12-13 VITALS — BP 109/68 | HR 88

## 2022-12-13 DIAGNOSIS — D563 Thalassemia minor: Secondary | ICD-10-CM | POA: Diagnosis not present

## 2022-12-13 DIAGNOSIS — O0990 Supervision of high risk pregnancy, unspecified, unspecified trimester: Secondary | ICD-10-CM

## 2022-12-13 DIAGNOSIS — O24119 Pre-existing diabetes mellitus, type 2, in pregnancy, unspecified trimester: Secondary | ICD-10-CM

## 2022-12-13 DIAGNOSIS — O281 Abnormal biochemical finding on antenatal screening of mother: Secondary | ICD-10-CM | POA: Diagnosis not present

## 2022-12-13 DIAGNOSIS — Z3A13 13 weeks gestation of pregnancy: Secondary | ICD-10-CM | POA: Diagnosis not present

## 2022-12-13 DIAGNOSIS — O10011 Pre-existing essential hypertension complicating pregnancy, first trimester: Secondary | ICD-10-CM | POA: Diagnosis not present

## 2022-12-13 DIAGNOSIS — O24111 Pre-existing diabetes mellitus, type 2, in pregnancy, first trimester: Secondary | ICD-10-CM

## 2022-12-13 DIAGNOSIS — O285 Abnormal chromosomal and genetic finding on antenatal screening of mother: Secondary | ICD-10-CM

## 2022-12-13 DIAGNOSIS — Z794 Long term (current) use of insulin: Secondary | ICD-10-CM | POA: Diagnosis not present

## 2022-12-13 DIAGNOSIS — E119 Type 2 diabetes mellitus without complications: Secondary | ICD-10-CM | POA: Diagnosis not present

## 2022-12-13 DIAGNOSIS — D259 Leiomyoma of uterus, unspecified: Secondary | ICD-10-CM | POA: Diagnosis not present

## 2022-12-13 DIAGNOSIS — O99211 Obesity complicating pregnancy, first trimester: Secondary | ICD-10-CM

## 2022-12-13 DIAGNOSIS — O3411 Maternal care for benign tumor of corpus uteri, first trimester: Secondary | ICD-10-CM | POA: Diagnosis not present

## 2022-12-13 DIAGNOSIS — O0941 Supervision of pregnancy with grand multiparity, first trimester: Secondary | ICD-10-CM

## 2022-12-13 DIAGNOSIS — O09521 Supervision of elderly multigravida, first trimester: Secondary | ICD-10-CM

## 2022-12-13 DIAGNOSIS — E669 Obesity, unspecified: Secondary | ICD-10-CM

## 2022-12-13 DIAGNOSIS — R898 Other abnormal findings in specimens from other organs, systems and tissues: Secondary | ICD-10-CM | POA: Diagnosis not present

## 2022-12-13 DIAGNOSIS — O10911 Unspecified pre-existing hypertension complicating pregnancy, first trimester: Secondary | ICD-10-CM

## 2022-12-13 MED ORDER — ADVOCATE INSULIN PEN NEEDLES 33G X 4 MM MISC: 1.0000 | Freq: Two times a day (BID) | 3 refills | Status: DC

## 2022-12-13 MED ORDER — ACCU-CHEK GUIDE VI STRP
ORAL_STRIP | 12 refills | Status: AC
Start: 2022-12-13 — End: ?

## 2022-12-13 MED ORDER — VITAFOL GUMMIES 3.33-0.333-34.8 MG PO CHEW
3.0000 | CHEWABLE_TABLET | Freq: Every day | ORAL | 11 refills | Status: DC
Start: 2022-12-13 — End: 2024-01-24

## 2022-12-13 MED ORDER — INSULIN NPH (HUMAN) (ISOPHANE) 100 UNIT/ML ~~LOC~~ SUSP
20.0000 [IU] | Freq: Two times a day (BID) | SUBCUTANEOUS | 3 refills | Status: DC
Start: 2022-12-13 — End: 2022-12-13

## 2022-12-13 MED ORDER — HUMULIN N KWIKPEN 100 UNIT/ML ~~LOC~~ SUPN: 20.0000 [IU] | PEN_INJECTOR | Freq: Two times a day (BID) | SUBCUTANEOUS | 11 refills | Status: DC

## 2022-12-13 MED ORDER — INSULIN STARTER KIT- SYRINGES (ENGLISH): 1.0000 | Freq: Once | 0 refills | Status: DC

## 2022-12-13 MED ORDER — METFORMIN HCL ER 500 MG PO TB24
500.0000 mg | ORAL_TABLET | Freq: Two times a day (BID) | ORAL | 2 refills | Status: DC
  Filled 2023-03-15: qty 60, 30d supply, fill #0

## 2022-12-13 MED ORDER — BLOOD GLUCOSE MONITOR KIT: PACK | 0 refills | Status: DC

## 2022-12-13 MED ORDER — ACCU-CHEK SOFTCLIX LANCETS MISC
12 refills | Status: AC
Start: 2022-12-13 — End: ?
  Filled 2023-03-15: qty 100, 25d supply, fill #0
  Filled 2023-05-14: qty 100, 25d supply, fill #1

## 2022-12-13 MED ORDER — CYCLOBENZAPRINE HCL 10 MG PO TABS: 10.0000 mg | ORAL_TABLET | Freq: Three times a day (TID) | ORAL | 0 refills | Status: DC | PRN

## 2022-12-13 MED ORDER — CETIRIZINE HCL 10 MG PO TABS
10.0000 mg | ORAL_TABLET | Freq: Every day | ORAL | 5 refills | Status: DC
  Filled 2023-03-15: qty 30, 30d supply, fill #0
  Filled 2023-04-25: qty 30, 30d supply, fill #1

## 2022-12-13 MED ORDER — "INSULIN SYRINGE-NEEDLE U-100 30G X 5/16"" 1 ML MISC"
1.0000 | Freq: Two times a day (BID) | 0 refills | Status: DC
  Filled 2022-12-13: qty 100, 50d supply, fill #0

## 2022-12-13 NOTE — Addendum Note (Signed)
Addended by: Faythe Casa on: 12/13/2022 04:31 PM   Modules accepted: Orders

## 2022-12-13 NOTE — Telephone Encounter (Signed)
Called patient and asked what prescriptions she has picked up regarding her insulin. Patient states she picked up [facility] vial last week but doesn't have syringes/needles. Called patient's pharmacy where Rx was sent and they state Medicaid is denying the prescription because it appears the patient has other insurance. Called the patient and she states she is getting [employer] but it isn't active until 5/1. Called [facility] who states patient can pay cash price of $8 for syringes with needles as well as complete patient teaching on how to give herself insulin since [facility] is full. Called patient back and discussed with her. Patient verbalized understanding.

## 2022-12-13 NOTE — Addendum Note (Signed)
Addended by: Isabell Jarvis on: 12/13/2022 10:11 AM   Modules accepted: Orders

## 2022-12-13 NOTE — Addendum Note (Signed)
Addended by: Isabell Jarvis on: 12/13/2022 11:19 AM   Modules accepted: Orders

## 2022-12-13 NOTE — Progress Notes (Signed)
[facility] at [facility] [address] Phone:  [phone]   Fax:  [phone]    Name: Monique Schmidt Indication: Low fetal fraction on cfDNA screen Maternal silent carrier for alpha thalassemia  DOB: [birth year] Age: 37 y.o.   EDD: 06/19/2023 LMP: 09/12/2022 Referring Provider:  Elfers Bing, MD  EGA: [redacted]w[redacted]d Genetic Counselor: Teena Dunk, MS, CGC  OB Hx: [id number] Date of Appointment: 12/13/2022  Accompanied by: Malissa Hippo Face to Face Time: 40 Minutes   Previous Testing Completed: Marycruz previously completed carrier screening. She screened to be a silent carrier for Alpha Thalassemia. She screened to not be a carrier for Cystic Fibrosis (CF), Spinal Muscular Atrophy (SMA), and Beta Hemoglobinopathies. A negative result on carrier screening reduces the likelihood of being a carrier, however, does not entirely rule out the possibility.   Medical & Family History:  Personal history of type two diabetes and chronic hypertension. Denies personal history of thyroid conditions and seizures. Denies bleeding, infections, and fevers in this pregnancy. Denies using tobacco, alcohol, or street drugs in this pregnancy. Maternal ethnicity reported as Leisure centre manager and paternal ethnicity reported as Leisure centre manager. Denies consanguinity. Vyctoria denied a family history of chromosome conditions, intellectual disability, autism, birth defects, bone/skeletal disorders, blindness, deafness, blood disorders, neuromuscular disorders, still births, early infant deaths, and 3 or more pregnancy losses for one person on her prenatal intake questionnaire.     Genetic Counseling:   Low Fetal Fraction on Cell-Free DNA Screen (cfDNA). We reviewed that cfDNA screening analyzes cell-free DNA originating from the placenta that is found in the maternal blood circulation during pregnancy. This test can provide information regarding the  presence or absence of extra fetal (placental) DNA for chromosomes 13, 18, and 21 as well as the sex chromosomes. Basha's cfDNA result is high risk due to a low fetal fraction (1.7%). Low fetal fraction has been associated with early gestational age, high maternal weight, sub-optimal sample collection, pregnancy loss, low molecular weight heparin, and certain chromosomal abnormalities. Given that there was a low fetal fraction in Anastazia's blood sample the laboratory was unable to obtain a reliable result using standard cfDNA screening methods, therefore, they performed an additional analysis incorporating fetal fraction, maternal age, maternal weight, and gestational age. Based upon the results of the additional analysis, the current pregnancy is at high risk (1 in 46) for Triploidy, Trisomy 74, or Trisomy 36. The risks for Trisomy 21 and Monosomy X are unchanged. Genetic counseling reviewed this result and information about the clinical features of Triploidy, Trisomy 9, and Trisomy 31 with [place]. We discussed that Avelina Laine will accept a repeat cfDNA sample. We also offered Evoleth prenatal diagnosis via CVS/Amniocentesis. CVS involves the withdrawal of a small amount of chorionic villi (tissue from the developing placenta). Amniocentesis involves the removal of a small amount of amniotic fluid from the sac surrounding the pregnancy. Ultrasound guidance is used throughout both procedures. Possible procedural difficulties and complications that can arise with these procedures include maternal infection, cramping, bleeding, fluid leakage, and/or pregnancy loss. The risk for pregnancy loss with a CVS/amniocentesis is 1/500. Genetic testing that could be ordered on a CVS or amniocentesis sample includes a karyotype, microarray, and testing for specific syndromes. Hiawatha was informed that diagnostic testing is the only way to definitively determine if a pregnancy is affected by aneuploidy prenatally.   Maternal  Silent Carrier for Alpha Thalassemia. Alpha Thalassemia refers to a group of autosomal recessive blood disorders that  reduce the amount of hemoglobin, the protein in red blood cells that carries oxygen to tissues throughout the body. Hemoglobin is made up of both alpha globin and beta globin proteins. Alpha Thalassemia is different in its inheritance compared to other hemoglobinopathies as there are two alpha-globin genes on each chromosome 16 (??/??). Alpha Thalassemia occurs when three or more of the four alpha-globin genes are deleted. Darrelyn is a silent carrier for alpha thalassemia (??/-?) caused by the pathogenic alpha 3.7 deletion of the HBA2 gene. With this result, we know that Brooks has three functional copies of the alpha-globin genes while her 4th alpha-globin gene is deleted. Each of Alessia's children will either inherit two functional genes (??) or one functional gene and one deletion (-?) from her.  Meilyn is not at increased risk to have a baby with fetal hydrops due to Hemoglobin Barts disease (four deleted alpha-globin genes: --/--) regardless of her reproductive partner's carrier status. Having four deleted alpha-globin genes results in severe anemia. Affected babies develop symptoms before birth and without treatment typically do not survive the newborn period. If Jalaine's reproductive partner is found to be an Alpha Thalassemia carrier in the cis configuration (two deleted alpha-globin genes on the same chromosome: ??/--) there would be a 25% risk for the current pregnancy to be affected with Hemoglobin H Disease (three deleted alpha-globin genes: --/-?). Clinical features of this condition are highly variable and generally develop in the first years of life. People with severe symptoms may have chronic anemia, liver disease, and bone changes. Some affected individuals do not require blood transfusions while others may require occasional blood transfusions throughout their  lifetime.  Given Bethzy's carrier screening result, carrier screening for her reproductive partner was offered to determine risk for the current pregnancy. If her reproductive partner is found to be a carrier for Alpha Thalassemia, given his Black/African American ancestry, it is more likely for him to be a silent carrier (??/-?) or a carrier in the trans configuration (two deleted or changed alpha-globin genes on opposite chromosomes: -?/-?), as the cis configuration (??/--) has been reported very rarely in individuals with Black/African American ancestry. If both members of a couple are known to be carriers, diagnostic testing is available to determine if the pregnancy is affected. If diagnostic testing via CVS/amniocentesis is not desired, Alpha Thalassemia testing can be completed after birth.  Birth Defects. All babies have approximately a 3-5% risk for a birth defect and a majority of these defects cannot be detected through the screening or diagnostic testing listed above. Ultrasound may detect some birth defects, but it may not detect all birth defects. About half of pregnancies with Down syndrome do not show any soft markers on ultrasound. A normal ultrasound does not guarantee a healthy pregnancy.     Patient Plan:  Proceed with: Routine prenatal care Informed consent was obtained. All questions were answered.  Declined: Repeat cfDNA screening, diagnostic testing (CVS/amniocentesis), carrier screening for Jazarah's reproductive partner for alpha thalassemia   Thank you for sharing in the care of Terrion with [place].  Please do not hesitate to contact us if you have any questions.  Teena Dunk, MS, [facility]

## 2022-12-17 ENCOUNTER — Encounter: Payer: Self-pay | Admitting: General Practice

## 2022-12-17 DIAGNOSIS — O24119 Pre-existing diabetes mellitus, type 2, in pregnancy, unspecified trimester: Secondary | ICD-10-CM

## 2022-12-20 ENCOUNTER — Encounter: Payer: Self-pay | Admitting: Obstetrics and Gynecology

## 2022-12-20 DIAGNOSIS — O3410 Maternal care for benign tumor of corpus uteri, unspecified trimester: Secondary | ICD-10-CM | POA: Insufficient documentation

## 2022-12-21 ENCOUNTER — Other Ambulatory Visit (HOSPITAL_COMMUNITY): Payer: Self-pay

## 2022-12-25 ENCOUNTER — Telehealth: Payer: Self-pay | Admitting: General Practice

## 2022-12-25 MED ORDER — INSULIN SYRINGE-NEEDLE U-100 30G X 5/16" 1 ML MISC
1.0000 | Freq: Two times a day (BID) | 0 refills | Status: DC
Start: 2022-12-25 — End: 2023-02-12

## 2022-12-25 NOTE — Telephone Encounter (Signed)
Called patient regarding mychart message. Patient states she has had an odor to her urine and didn't leave [place] a sample last time she was here but she was supposed. Asked patient if she could come for insulin teaching Thursday at 11:15 and we can do it then. Patient verbalized understanding. Asked patient to bring insulin vial and syringes to her appt. Patient verbalized understanding.

## 2022-12-27 ENCOUNTER — Other Ambulatory Visit: Payer: Medicaid Other

## 2022-12-27 ENCOUNTER — Encounter: Payer: Self-pay | Admitting: General Practice

## 2022-12-27 ENCOUNTER — Telehealth: Payer: Self-pay | Admitting: General Practice

## 2022-12-27 DIAGNOSIS — O24119 Pre-existing diabetes mellitus, type 2, in pregnancy, unspecified trimester: Secondary | ICD-10-CM

## 2022-12-27 MED ORDER — HUMULIN N KWIKPEN 100 UNIT/ML ~~LOC~~ SUPN
PEN_INJECTOR | SUBCUTANEOUS | 11 refills | Status: DC
Start: 2022-12-27 — End: 2023-01-28

## 2022-12-27 NOTE — Telephone Encounter (Signed)
Received telephone call from [facility] insurance with [name] on the phone regarding a PA needed for Novolin kwikpen. Discussed that PA wasn't needed as she already has insulin vial. Patient states she wishes to use the kwikpen instead. Asked [employer] if Humulin N was the preferred over Novolin N and they state yes. Informed them no PA was needed and the preferred would be ordered. Asked the patient if she could pick it up and bring it to her appt today for insulin teaching. Patient asked about a later appt time. Told patient that is all we have and the next appt wouldn't be for 3 weeks. Patient states the pharmacy won't have it ready in time for her appt today at 1115. Told patient I would call the pharmacy now and have them urgently get the prescription ready for her if she can be here today. Patient verbalized understanding and states that she would.

## 2022-12-27 NOTE — Progress Notes (Deleted)
Insulin Instruction  Patient was seen on *** for insulin instruction.   Start: ***     End: ***   MD orders are:   NPH *** units qhs  The following learning objectives were met by the patient during this visit:   Insulin Action of NPH and R insulins  Reviewed syringe & vial including # units per syringe and vial  Hygiene and storage  Drawing up single dose using vials  Rotation of Sites  Hypoglycemia- symptoms, causes, treatment choices  Record keeping and MD follow up  Patient demonstrated understanding of insulin administration by return demonstration.  Patient received the following handouts: Insulin Instruction Handout                                        Patient to start on insulin as Rx'd by MD

## 2023-01-01 ENCOUNTER — Inpatient Hospital Stay (HOSPITAL_COMMUNITY)
Admission: AD | Admit: 2023-01-01 | Discharge: 2023-01-02 | Disposition: A | Discharge status: Home / Self Care | Payer: Medicaid Other | Attending: Obstetrics and Gynecology | Admitting: Obstetrics and Gynecology

## 2023-01-01 DIAGNOSIS — O24112 Pre-existing diabetes mellitus, type 2, in pregnancy, second trimester: Secondary | ICD-10-CM | POA: Insufficient documentation

## 2023-01-01 DIAGNOSIS — O24119 Pre-existing diabetes mellitus, type 2, in pregnancy, unspecified trimester: Secondary | ICD-10-CM | POA: Diagnosis not present

## 2023-01-01 DIAGNOSIS — O98812 Other maternal infectious and parasitic diseases complicating pregnancy, second trimester: Secondary | ICD-10-CM | POA: Insufficient documentation

## 2023-01-01 DIAGNOSIS — Z3A16 16 weeks gestation of pregnancy: Secondary | ICD-10-CM | POA: Insufficient documentation

## 2023-01-01 DIAGNOSIS — O23592 Infection of other part of genital tract in pregnancy, second trimester: Secondary | ICD-10-CM | POA: Insufficient documentation

## 2023-01-01 DIAGNOSIS — Z7984 Long term (current) use of oral hypoglycemic drugs: Secondary | ICD-10-CM | POA: Diagnosis not present

## 2023-01-01 DIAGNOSIS — O0990 Supervision of high risk pregnancy, unspecified, unspecified trimester: Secondary | ICD-10-CM

## 2023-01-01 DIAGNOSIS — M549 Dorsalgia, unspecified: Secondary | ICD-10-CM | POA: Diagnosis present

## 2023-01-01 DIAGNOSIS — B3731 Acute candidiasis of vulva and vagina: Secondary | ICD-10-CM | POA: Insufficient documentation

## 2023-01-01 NOTE — MAU Note (Signed)
.  Monique Schmidt is a 37 y.o. at [redacted]w[redacted]d here in MAU reporting lower back pain and body aching for 2 days. Also some headaches. States she does not have her insulin and this is how she feels when her sugar is high. Urine has an odor but no dysuria  Onset of complaint: 2 days Pain score: back is 9, body aches 8, h/a 8 Vitals:   01/01/23 2351 01/01/23 2352  BP:  (!) 130/91  Pulse: 86   Resp: 18   Temp: 98.2 F (36.8 C)   SpO2: 99%      FHT:150 Lab orders placed from triage:  u/a

## 2023-01-02 ENCOUNTER — Encounter (HOSPITAL_COMMUNITY): Payer: Self-pay | Admitting: Obstetrics and Gynecology

## 2023-01-02 DIAGNOSIS — O24119 Pre-existing diabetes mellitus, type 2, in pregnancy, unspecified trimester: Secondary | ICD-10-CM

## 2023-01-02 DIAGNOSIS — Z3A16 16 weeks gestation of pregnancy: Secondary | ICD-10-CM

## 2023-01-02 DIAGNOSIS — B3731 Acute candidiasis of vulva and vagina: Secondary | ICD-10-CM

## 2023-01-02 LAB — URINALYSIS, ROUTINE W REFLEX MICROSCOPIC
Bacteria, UA: NONE SEEN
Bilirubin Urine: NEGATIVE
Glucose, UA: >=500 mg/dL — AB
Hgb urine dipstick: NEGATIVE
Ketones, ur: 5 mg/dL — AB
Leukocytes,Ua: NEGATIVE
Nitrite: NEGATIVE
Protein, ur: NEGATIVE mg/dL
Specific Gravity, Urine: 1.025 (ref 1.005–1.030)
pH: 7 (ref 5.0–8.0)

## 2023-01-02 LAB — WET PREP, GENITAL
Sperm: NONE SEEN
Trich, Wet Prep: NONE SEEN
WBC, Wet Prep HPF POC: <10 (ref <10)

## 2023-01-02 LAB — GC/CHLAMYDIA PROBE AMP (~~LOC~~) NOT AT ARMC
Chlamydia: NEGATIVE
Comment: NEGATIVE
Comment: NORMAL
Neisseria Gonorrhea: NEGATIVE

## 2023-01-02 LAB — GROUP A STREP BY PCR: Group A Strep by PCR: NOT DETECTED

## 2023-01-02 LAB — GLUCOSE, CAPILLARY: Glucose-Capillary: 195 mg/dL — ABNORMAL HIGH (ref 70–99)

## 2023-01-02 MED ORDER — TERCONAZOLE 0.4 % VA CREA: 1.0000 | TOPICAL_CREAM | Freq: Every day | VAGINAL | 0 refills | Status: DC

## 2023-01-02 MED ORDER — INSULIN ASPART 100 UNIT/ML IJ SOLN
4.0000 [IU] | Freq: Once | INTRAMUSCULAR | Status: AC
  Administered 2023-01-02: 4 [IU] via SUBCUTANEOUS

## 2023-01-02 MED ORDER — ACETAMINOPHEN 500 MG PO TABS
1000.0000 mg | ORAL_TABLET | Freq: Once | ORAL | Status: AC
  Administered 2023-01-02: 1000 mg via ORAL
  Filled 2023-01-02: qty 2

## 2023-01-02 NOTE — Discharge Instructions (Signed)

## 2023-01-02 NOTE — MAU Provider Note (Signed)
History     [id number]  Arrival date and time: 01/01/23 2322    Chief Complaint  Patient presents with   Back Pain     HPI Monique Schmidt is a 37 y.o. [id number] at [redacted]w[redacted]d by ultrasound who presents for back pain, vaginal discharge, & sore throat. States all her symptoms started over the weekend.  Reports sore throat, body aches, & bilateral ear discomfort she believes is due to her allergies. States she's been taking "allergy" medication but otherwise hasn't treated her symptoms. Denies fever or cough.  Reports bilateral low back pain & thick discharge. Unsure if she has UTI or yeast infection. Thinks her blood sugar may be high as well. Has had issues getting her insulin rx filled but plans on picking it up from the pharmacy when she leaves here. Denies n/v/d, dysuria, vaginal bleeding, or LOF.    A/Positive/-- (04/01 1634)  OB History     Gravida  7   Para  5   Term  5   Preterm      AB  1   Living  5      SAB  1   IAB      Ectopic      Multiple  0   Live Births  5           Past Medical History:  Diagnosis Date   Abnormal Pap smear    Bacterial vaginosis    Gestational diabetes    likely pre-exisiting; all pregnancies glyburide started 11/4   Gestational thrombocytopenia (HCC)    Headache(784.0)    Hernia, umbilical    History of abnormal cervical Pap smear    normal 2014   History of gestational diabetes 06/26/2019   Mild preeclampsia delivered 01/22/2016   Type 2 diabetes mellitus (HCC)     Past Surgical History:  Procedure Laterality Date   UMBILICAL HERNIA REPAIR N/A 11/22/2014   Procedure: LAPAROSCOPIC UMBILICAL HERNIA;  Surgeon: Axel Filler, MD;  Location: [facility] OR;  Service: General;  Laterality: N/A;   WISDOM TOOTH EXTRACTION      Family History  Problem Relation Age of Onset   Hypertension Father    Diabetes Father    Diabetes Mother    Seizures Brother     Social History   Socioeconomic History   Marital status: Divorced     Spouse name: Not on file   Number of children: Not on file   Years of education: Not on file   Highest education level: Not on file  Occupational History   Not on file  Tobacco Use   Smoking status: Never   Smokeless tobacco: Never  Vaping Use   Vaping Use: Never used  Substance and Sexual Activity   Alcohol use: No   Drug use: No   Sexual activity: Yes    Birth control/protection: None  Other Topics Concern   Not on file  Social History Narrative   Not on file   Social Determinants of Health   Financial Resource Strain: Not on file  Food Insecurity: Food Insecurity Present (11/19/2022)   Hunger Vital Sign    Worried About Running Out of Food in the Last Year: Sometimes true    Ran Out of Food in the Last Year: Sometimes true  Transportation Needs: No Transportation Needs (11/19/2022)   PRAPARE - Administrator, Civil Service (Medical): No    Lack of Transportation (Non-Medical): No  Physical Activity: Not on file  Stress: Not on  file  Social Connections: Not on file  Intimate Partner Violence: Not on file    Allergies  Allergen Reactions   Latex Itching and Rash   Penicillins Itching and Rash    Did it involve swelling of the face/tongue/throat, SOB, or low BP? N Did it involve sudden or severe rash/hives, skin peeling, or any reaction on the inside of your mouth or nose? Y Did you need to seek medical attention at a hospital or doctor's office? No When did it last happen?  Several  Years Ago    If all above answers are "NO", may proceed with cephalosporin use.        No current facility-administered medications on file prior to encounter.   Current Outpatient Medications on File Prior to Encounter  Medication Sig Dispense Refill   aspirin EC 81 MG tablet Take 2 tablets (162 mg total) by mouth daily. Start taking when you are [redacted] weeks pregnant for rest of pregnancy for prevention of preeclampsia 300 tablet 2   BD PEN NEEDLE NANO 2ND GEN 32G X 4 MM  MISC Inject into the skin 2 (two) times daily.     metFORMIN (GLUCOPHAGE-XR) 500 MG 24 hr tablet Take 1 tablet (500 mg total) by mouth 2 (two) times daily with a meal. 60 tablet 2   NIFEdipine (PROCARDIA XL) 30 MG 24 hr tablet Take 1 tablet (30 mg total) by mouth daily. 30 tablet 6   Prenatal Vit-Fe Phos-FA-Omega (VITAFOL GUMMIES) 3.33-0.333-34.8 MG CHEW Chew 3 tablets by mouth daily. 90 tablet 11   Accu-Chek Softclix Lancets lancets Use four times per day as instructed 100 each 12   albuterol (VENTOLIN HFA) 108 (90 Base) MCG/ACT inhaler Inhale 1-2 puffs into the lungs every 6 (six) hours as needed for wheezing or shortness of breath. (Patient not taking: Reported on 12/13/2022) 18 g 0   blood glucose meter kit and supplies KIT Dispense based on patient and insurance preference. Use up to four times daily as directed. 1 each 0   cetirizine (ZYRTEC ALLERGY) 10 MG tablet Take 1 tablet (10 mg total) by mouth daily. (Patient not taking: Reported on 12/13/2022) 30 tablet 5   cyclobenzaprine (FLEXERIL) 10 MG tablet Take 1 tablet (10 mg total) by mouth every 8 (eight) hours as needed for muscle spasms. (Patient not taking: Reported on 12/13/2022) 30 tablet 0   glucose blood (ACCU-CHEK GUIDE) test strip Use four times per day as instructed 100 each 12   Insulin NPH, Human,, Isophane, (HUMULIN N KWIKPEN) 100 UNIT/ML Kiwkpen 20 units at 8am and 20 units at 10pm 15 mL 11   Insulin Syringe-Needle U-100 (ADVOCATE INSULIN SYRINGE) 30G X 5/16" 1 ML MISC Use 2 (two) times daily to inject insulin. 100 each 0   [DISCONTINUED] medroxyPROGESTERone (PROVERA) 10 MG tablet Take 1 tablet (10 mg total) by mouth daily. 10 tablet 3     ROS Pertinent positives and negative per HPI, all others reviewed and negative  Physical Exam   BP (!) 130/91   Pulse 86   Temp 98.2 F (36.8 C)   Resp 16   Ht 5\' 6"  (1.676 m)   Wt (!) 138.3 kg   LMP 09/09/2022 (Approximate)   SpO2 99%   BMI 49.23 kg/m   Patient Vitals for the past  24 hrs:  BP Temp Pulse Resp SpO2 Height Weight  01/02/23 0225 -- -- -- 16 -- -- --  01/01/23 2352 (!) 130/91 -- -- -- -- -- --  01/01/23 2351 --  98.2 F (36.8 C) 86 18 99 % 5\' 6"  (1.676 m) (!) 138.3 kg    Physical Exam Vitals and nursing note reviewed.  Constitutional:      General: She is not in acute distress.    Appearance: She is well-developed. She is not ill-appearing.  HENT:     Head: Normocephalic and atraumatic.     Right Ear: Tympanic membrane normal.     Left Ear: Tympanic membrane normal.     Mouth/Throat:     Lips: Pink.     Mouth: Mucous membranes are moist.     Pharynx: Uvula midline. Posterior oropharyngeal erythema present. No oropharyngeal exudate or uvula swelling.  Eyes:     General: No scleral icterus.       Right eye: No discharge.        Left eye: No discharge.     Conjunctiva/sclera: Conjunctivae normal.  Cardiovascular:     Rate and Rhythm: Normal rate and regular rhythm.     Heart sounds: Normal heart sounds.  Pulmonary:     Effort: Pulmonary effort is normal. No respiratory distress.     Breath sounds: Normal breath sounds. No wheezing.  Abdominal:     Tenderness: There is no right CVA tenderness or left CVA tenderness.  Musculoskeletal:     Cervical back: Neck supple. No tenderness.  Lymphadenopathy:     Cervical: No cervical adenopathy.  Neurological:     General: No focal deficit present.     Mental Status: She is alert.  Psychiatric:        Mood and Affect: Mood normal.        Behavior: Behavior normal.      Labs Results for orders placed or performed during the hospital encounter of 01/01/23 (from the past 24 hour(s))  Urinalysis, Routine w reflex microscopic -Urine, Clean Catch     Status: Abnormal   Collection Time: 01/02/23 12:04 AM  Result Value Ref Range   Color, Urine YELLOW YELLOW   APPearance CLEAR CLEAR   Specific Gravity, Urine 1.025 1.005 - 1.030   pH 7.0 5.0 - 8.0   Glucose, UA >=500 (A) NEGATIVE mg/dL   Hgb urine  dipstick NEGATIVE NEGATIVE   Bilirubin Urine NEGATIVE NEGATIVE   Ketones, ur 5 (A) NEGATIVE mg/dL   Protein, ur NEGATIVE NEGATIVE mg/dL   Nitrite NEGATIVE NEGATIVE   Leukocytes,Ua NEGATIVE NEGATIVE   RBC / HPF 0-5 0 - 5 RBC/hpf   WBC, UA 0-5 0 - 5 WBC/hpf   Bacteria, UA NONE SEEN NONE SEEN   Squamous Epithelial / HPF 0-5 0 - 5 /HPF   Mucus PRESENT   Glucose, capillary     Status: Abnormal   Collection Time: 01/02/23 12:17 AM  Result Value Ref Range   Glucose-Capillary 195 (H) 70 - 99 mg/dL  Group A Strep by PCR     Status: None   Collection Time: 01/02/23 12:55 AM   Specimen: Throat; Sterile Swab  Result Value Ref Range   Group A Strep by PCR NOT DETECTED NOT DETECTED  Wet prep, genital     Status: Abnormal   Collection Time: 01/02/23  1:05 AM  Result Value Ref Range   Yeast Wet Prep HPF POC PRESENT (A) NONE SEEN   Trich, Wet Prep NONE SEEN NONE SEEN   Clue Cells Wet Prep HPF POC PRESENT (A) NONE SEEN   WBC, Wet Prep HPF POC <10 <10   Sperm NONE SEEN     Imaging No results found.  MAU Course  Procedures Lab Orders         Wet prep, genital         Group A Strep by PCR         Wet prep, genital         Urinalysis, Routine w reflex microscopic -Urine, Clean Catch         Glucose, capillary    Meds ordered this encounter  Medications   acetaminophen (TYLENOL) tablet 1,000 mg   insulin aspart (novoLOG) injection 4 Units   terconazole (TERAZOL 7) 0.4 % vaginal cream    Sig: Place 1 applicator vaginally at bedtime. Use for seven days    Dispense:  45 g    Refill:  0    Order Specific Question:   Supervising Provider    Answer:   CONSTANT, PEGGY [[id number]]   Imaging Orders  No imaging studies ordered today    MDM moderate  Assessment and Plan   1. Vaginal yeast infection   2. Type 2 diabetes mellitus affecting pregnancy, antepartum   3. [redacted] weeks gestation of pregnancy    -Patient presents with multiple complaints. Other than oropharyngeal erythema, she had benign  exam. Swab for strep throat is negative.  -BS tonight is 195. Hasn't been taking insulin but plans on picking it up when she leaves here. Has f/u with [facility] & [facility]. Novolog 4 units given while in MAU.  -Back pain improved with heat packs & tylenol -U/a negative for infection -Wet prep positive for yeast & clue cells. Patient declines discharge consistent with BV. Will prescribe terazol for yeast.   #FWB: FHT 155 by doppler    Dispo: discharged to home in stable condition.   Discharge Instructions     Discharge patient   Complete by: As directed    Discharge disposition: 01-Home or Self Care   Discharge patient date: 01/02/2023       Judeth Horn, NP 01/02/23 2:45 AM  Allergies as of 01/02/2023       Reactions   Latex Itching, Rash   Penicillins Itching, Rash   Did it involve swelling of the face/tongue/throat, SOB, or low BP? N Did it involve sudden or severe rash/hives, skin peeling, or any reaction on the inside of your mouth or nose? Y Did you need to seek medical attention at a hospital or doctor's office? No When did it last happen?  Several  Years Ago    If all above answers are "NO", may proceed with cephalosporin use.        Medication List     TAKE these medications    Accu-Chek Guide test strip Generic drug: glucose blood Use four times per day as instructed   Accu-Chek Softclix Lancets lancets Use four times per day as instructed   albuterol 108 (90 Base) MCG/ACT inhaler Commonly known as: VENTOLIN HFA Inhale 1-2 puffs into the lungs every 6 (six) hours as needed for wheezing or shortness of breath.   aspirin EC 81 MG tablet Take 2 tablets (162 mg total) by mouth daily. Start taking when you are [redacted] weeks pregnant for rest of pregnancy for prevention of preeclampsia   BD Pen Needle Nano 2nd Gen 32G X 4 MM Misc Generic drug: Insulin Pen Needle Inject into the skin 2 (two) times daily.   blood glucose meter kit and supplies Kit Dispense based on  patient and insurance preference. Use up to four times daily as directed.   cetirizine 10 MG tablet Commonly known  as: ZyrTEC Allergy Take 1 tablet (10 mg total) by mouth daily.   cyclobenzaprine 10 MG tablet Commonly known as: FLEXERIL Take 1 tablet (10 mg total) by mouth every 8 (eight) hours as needed for muscle spasms.   HumuLIN N KwikPen 100 UNIT/ML Kiwkpen Generic drug: Insulin NPH (Human) (Isophane) 20 units at 8am and 20 units at 10pm   Insulin Syringe-Needle U-100 30G X 5/16" 1 ML Misc Commonly known as: Advocate Insulin Syringe Use 2 (two) times daily to inject insulin.   metFORMIN 500 MG 24 hr tablet Commonly known as: GLUCOPHAGE-XR Take 1 tablet (500 mg total) by mouth 2 (two) times daily with a meal.   NIFEdipine 30 MG 24 hr tablet Commonly known as: Procardia XL Take 1 tablet (30 mg total) by mouth daily.   terconazole 0.4 % vaginal cream Commonly known as: TERAZOL 7 Place 1 applicator vaginally at bedtime. Use for seven days   Vitafol Gummies 3.33-0.333-34.8 MG Chew Chew 3 tablets by mouth daily.

## 2023-01-09 ENCOUNTER — Encounter: Payer: Self-pay | Admitting: Obstetrics and Gynecology

## 2023-01-19 ENCOUNTER — Encounter (HOSPITAL_COMMUNITY): Payer: Self-pay | Admitting: Family Medicine

## 2023-01-19 ENCOUNTER — Inpatient Hospital Stay (HOSPITAL_COMMUNITY)
Admission: AD | Admit: 2023-01-19 | Discharge: 2023-01-20 | Disposition: A | Discharge status: Home / Self Care | Payer: Medicaid Other | Attending: Family Medicine | Admitting: Family Medicine

## 2023-01-19 DIAGNOSIS — B9689 Other specified bacterial agents as the cause of diseases classified elsewhere: Secondary | ICD-10-CM

## 2023-01-19 DIAGNOSIS — O26892 Other specified pregnancy related conditions, second trimester: Secondary | ICD-10-CM | POA: Insufficient documentation

## 2023-01-19 DIAGNOSIS — J069 Acute upper respiratory infection, unspecified: Secondary | ICD-10-CM | POA: Diagnosis not present

## 2023-01-19 DIAGNOSIS — Z3A18 18 weeks gestation of pregnancy: Secondary | ICD-10-CM

## 2023-01-19 DIAGNOSIS — O09292 Supervision of pregnancy with other poor reproductive or obstetric history, second trimester: Secondary | ICD-10-CM | POA: Diagnosis present

## 2023-01-19 DIAGNOSIS — O23592 Infection of other part of genital tract in pregnancy, second trimester: Secondary | ICD-10-CM | POA: Diagnosis present

## 2023-01-19 DIAGNOSIS — O98812 Other maternal infectious and parasitic diseases complicating pregnancy, second trimester: Secondary | ICD-10-CM | POA: Insufficient documentation

## 2023-01-19 DIAGNOSIS — B3731 Acute candidiasis of vulva and vagina: Secondary | ICD-10-CM

## 2023-01-19 DIAGNOSIS — N760 Acute vaginitis: Secondary | ICD-10-CM | POA: Diagnosis not present

## 2023-01-19 DIAGNOSIS — Z1152 Encounter for screening for COVID-19: Secondary | ICD-10-CM | POA: Diagnosis not present

## 2023-01-19 DIAGNOSIS — O24112 Pre-existing diabetes mellitus, type 2, in pregnancy, second trimester: Secondary | ICD-10-CM | POA: Diagnosis present

## 2023-01-19 LAB — URINALYSIS, ROUTINE W REFLEX MICROSCOPIC
Bilirubin Urine: NEGATIVE
Glucose, UA: NEGATIVE mg/dL
Hgb urine dipstick: NEGATIVE
Ketones, ur: NEGATIVE mg/dL
Nitrite: NEGATIVE
Protein, ur: NEGATIVE mg/dL
Specific Gravity, Urine: 1.019 (ref 1.005–1.030)
pH: 7 (ref 5.0–8.0)

## 2023-01-19 LAB — GROUP A STREP BY PCR: Group A Strep by PCR: NOT DETECTED

## 2023-01-19 MED ORDER — ACETAMINOPHEN 500 MG PO TABS
1000.0000 mg | ORAL_TABLET | Freq: Four times a day (QID) | ORAL | Status: DC | PRN
  Administered 2023-01-19: 1000 mg via ORAL
  Filled 2023-01-19: qty 2

## 2023-01-19 NOTE — Discharge Instructions (Signed)

## 2023-01-19 NOTE — MAU Note (Signed)
.  Monique Schmidt is a 37 y.o. at [redacted]w[redacted]d here in MAU reporting: headache, throat soreness, back pain, burning with urination.Chills and Fever 101 at home.And nausea without vomiting. No diarrhea All started this morning, yesterday daughter came home sick with cough. Patient states she has not taken any medications for fever, pain or headache Denies contractions or cramping, vaginal bleeding or discharge. Denies SOB (only while talking), denies chest pain or pressure  Onset of complaint: 6/1 am Pain score: 9 HA                       Back                       Throat Vitals:   01/19/23 2221 01/19/23 2230  BP:  (!) 137/92  Pulse:  (!) 109  Resp:  18  Temp:  99.4 F (37.4 C)  SpO2: 98% 99%     [id number] until able to place pt supine due to maternal habitus Lab orders placed from triage:  UA

## 2023-01-19 NOTE — MAU Provider Note (Signed)
History     CSN: [id number]  Arrival date and time: 01/19/23 2211   Event Date/Time   First Provider Initiated Contact with Patient 01/19/23 2328      No chief complaint on file.  37 y.o. [id number] @18 .3 wks presenting with sore throat, fever, body aches and HA. Sx started today. Reports being exposed to her sick child yesterday. Reports temp 101 today. Has not taken any Tylenol. No cough. Some SOB when she is talking. Reports vaginal burning when she urinates. Denies urinary sx. Hx of CHTN and T2DM and she took her meds today. Reports good BS control. Rates pain 8-9/10.     OB History     Gravida  7   Para  5   Term  5   Preterm      AB  1   Living  5      SAB  1   IAB      Ectopic      Multiple  0   Live Births  5           Past Medical History:  Diagnosis Date   Abnormal Pap smear    Bacterial vaginosis    Gestational diabetes    likely pre-exisiting; all pregnancies glyburide started 11/4   Gestational thrombocytopenia (HCC)    Headache(784.0)    Hernia, umbilical    History of abnormal cervical Pap smear    normal 2014   History of gestational diabetes 06/26/2019   Mild preeclampsia delivered 01/22/2016   Type 2 diabetes mellitus (HCC)     Past Surgical History:  Procedure Laterality Date   UMBILICAL HERNIA REPAIR N/A 11/22/2014   Procedure: LAPAROSCOPIC UMBILICAL HERNIA;  Surgeon: Axel Filler, MD;  Location: [facility] OR;  Service: General;  Laterality: N/A;   WISDOM TOOTH EXTRACTION      Family History  Problem Relation Age of Onset   Hypertension Father    Diabetes Father    Diabetes Mother    Seizures Brother     Social History   Tobacco Use   Smoking status: Never   Smokeless tobacco: Never  Vaping Use   Vaping Use: Never used  Substance Use Topics   Alcohol use: No   Drug use: No    Allergies:  Allergies  Allergen Reactions   Latex Itching and Rash   Penicillins Itching and Rash    Did it involve swelling of the  face/tongue/throat, SOB, or low BP? N Did it involve sudden or severe rash/hives, skin peeling, or any reaction on the inside of your mouth or nose? Y Did you need to seek medical attention at a hospital or doctor's office? No When did it last happen?  Several  Years Ago    If all above answers are "NO", may proceed with cephalosporin use.        Medications Prior to Admission  Medication Sig Dispense Refill Last Dose   BD PEN NEEDLE NANO 2ND GEN 32G X 4 MM MISC Inject into the skin 2 (two) times daily.   01/19/2023   Insulin Syringe-Needle U-100 (ADVOCATE INSULIN SYRINGE) 30G X 5/16" 1 ML MISC Use 2 (two) times daily to inject insulin. 100 each 0 01/19/2023 at 1000   metFORMIN (GLUCOPHAGE-XR) 500 MG 24 hr tablet Take 1 tablet (500 mg total) by mouth 2 (two) times daily with a meal. 60 tablet 2 01/19/2023 at 0800   NIFEdipine (PROCARDIA XL) 30 MG 24 hr tablet Take 1 tablet (30 mg total) by mouth  daily. 30 tablet 6 01/19/2023 at 0800   Prenatal Vit-Fe Phos-FA-Omega (VITAFOL GUMMIES) 3.33-0.333-34.8 MG CHEW Chew 3 tablets by mouth daily. 90 tablet 11 01/19/2023   terconazole (TERAZOL 7) 0.4 % vaginal cream Place 1 applicator vaginally at bedtime. Use for seven days 45 g 0 Past Week   Accu-Chek Softclix Lancets lancets Use four times per day as instructed 100 each 12    albuterol (VENTOLIN HFA) 108 (90 Base) MCG/ACT inhaler Inhale 1-2 puffs into the lungs every 6 (six) hours as needed for wheezing or shortness of breath. (Patient not taking: Reported on 12/13/2022) 18 g 0    aspirin EC 81 MG tablet Take 2 tablets (162 mg total) by mouth daily. Start taking when you are [redacted] weeks pregnant for rest of pregnancy for prevention of preeclampsia 300 tablet 2    blood glucose meter kit and supplies KIT Dispense based on patient and insurance preference. Use up to four times daily as directed. 1 each 0    cetirizine (ZYRTEC ALLERGY) 10 MG tablet Take 1 tablet (10 mg total) by mouth daily. (Patient not taking:  Reported on 12/13/2022) 30 tablet 5    cyclobenzaprine (FLEXERIL) 10 MG tablet Take 1 tablet (10 mg total) by mouth every 8 (eight) hours as needed for muscle spasms. (Patient not taking: Reported on 12/13/2022) 30 tablet 0    glucose blood (ACCU-CHEK GUIDE) test strip Use four times per day as instructed 100 each 12    Insulin NPH, Human,, Isophane, (HUMULIN N KWIKPEN) 100 UNIT/ML Kiwkpen 20 units at 8am and 20 units at 10pm 15 mL 11     Review of Systems  HENT:  Positive for sore throat. Negative for congestion and rhinorrhea.   Respiratory:  Positive for shortness of breath. Negative for cough.   Cardiovascular:  Negative for chest pain.  Gastrointestinal:  Negative for abdominal pain.  Genitourinary:  Positive for vaginal discharge and vaginal pain. Negative for vaginal bleeding.  Musculoskeletal:  Positive for back pain.  Neurological:  Positive for headaches.   Physical Exam   Blood pressure (!) 135/93, pulse 99, temperature 99.4 F (37.4 C), temperature source Oral, resp. rate 18, height 5\' 6"  (1.676 m), weight (!) 137.2 kg, last menstrual period 09/09/2022, SpO2 99 %.  Physical Exam Vitals and nursing note reviewed.  Constitutional:      General: She is not in acute distress (appears comfortable).    Appearance: Normal appearance.  HENT:     Head: Normocephalic and atraumatic.     Mouth/Throat:     Lips: Pink.     Mouth: Mucous membranes are moist.     Palate: No lesions.     Pharynx: Oropharynx is clear. Uvula midline. No oropharyngeal exudate, posterior oropharyngeal erythema or uvula swelling.     Tonsils: No tonsillar exudate or tonsillar abscesses.  Cardiovascular:     Rate and Rhythm: Normal rate and regular rhythm.     Heart sounds: Normal heart sounds.  Pulmonary:     Effort: Pulmonary effort is normal. No accessory muscle usage or respiratory distress.     Breath sounds: Normal breath sounds. No stridor. No wheezing, rhonchi or rales.  Musculoskeletal:         General: Normal range of motion.     Cervical back: Normal range of motion.  Skin:    General: Skin is warm and dry.  Neurological:     General: No focal deficit present.     Mental Status: She is alert and oriented to  person, place, and time.  Psychiatric:        Mood and Affect: Mood normal.        Behavior: Behavior normal.   FHT 161  Results for orders placed or performed during the hospital encounter of 01/19/23 (from the past 24 hour(s))  Urinalysis, Routine w reflex microscopic -Urine, Clean Catch     Status: Abnormal   Collection Time: 01/19/23 10:42 PM  Result Value Ref Range   Color, Urine YELLOW YELLOW   APPearance HAZY (A) CLEAR   Specific Gravity, Urine 1.019 1.005 - 1.030   pH 7.0 5.0 - 8.0   Glucose, UA NEGATIVE NEGATIVE mg/dL   Hgb urine dipstick NEGATIVE NEGATIVE   Bilirubin Urine NEGATIVE NEGATIVE   Ketones, ur NEGATIVE NEGATIVE mg/dL   Protein, ur NEGATIVE NEGATIVE mg/dL   Nitrite NEGATIVE NEGATIVE   Leukocytes,Ua TRACE (A) NEGATIVE   RBC / HPF 6-10 0 - 5 RBC/hpf   WBC, UA 6-10 0 - 5 WBC/hpf   Bacteria, UA RARE (A) NONE SEEN   Squamous Epithelial / HPF 6-10 0 - 5 /HPF   Mucus PRESENT    MAU Course  Procedures Tylenol  MDM Labs ordered and reviewed.   Assessment and Plan  ***  Donette Larry, CNM 01/19/2023, 11:28 PM

## 2023-01-20 DIAGNOSIS — B9689 Other specified bacterial agents as the cause of diseases classified elsewhere: Secondary | ICD-10-CM | POA: Diagnosis not present

## 2023-01-20 DIAGNOSIS — N760 Acute vaginitis: Secondary | ICD-10-CM

## 2023-01-20 DIAGNOSIS — B3731 Acute candidiasis of vulva and vagina: Secondary | ICD-10-CM | POA: Diagnosis not present

## 2023-01-20 DIAGNOSIS — Z3A18 18 weeks gestation of pregnancy: Secondary | ICD-10-CM

## 2023-01-20 DIAGNOSIS — J069 Acute upper respiratory infection, unspecified: Secondary | ICD-10-CM | POA: Diagnosis not present

## 2023-01-20 LAB — WET PREP, GENITAL
Sperm: NONE SEEN
Trich, Wet Prep: NONE SEEN
WBC, Wet Prep HPF POC: >=10 — AB (ref <10)

## 2023-01-20 LAB — SARS CORONAVIRUS 2 BY RT PCR: SARS Coronavirus 2 by RT PCR: NEGATIVE

## 2023-01-20 MED ORDER — FLUCONAZOLE 150 MG PO TABS: 150.0000 mg | ORAL_TABLET | Freq: Once | ORAL | 0 refills | Status: AC

## 2023-01-20 MED ORDER — ACETAMINOPHEN 500 MG PO TABS: 1000.0000 mg | ORAL_TABLET | Freq: Four times a day (QID) | ORAL | 0 refills | Status: DC | PRN

## 2023-01-20 MED ORDER — METRONIDAZOLE 500 MG PO TABS: 500.0000 mg | ORAL_TABLET | Freq: Two times a day (BID) | ORAL | 0 refills | Status: DC

## 2023-01-21 LAB — CULTURE, OB URINE

## 2023-01-21 LAB — GC/CHLAMYDIA PROBE AMP (~~LOC~~) NOT AT ARMC
Chlamydia: NEGATIVE
Comment: NEGATIVE
Comment: NORMAL
Neisseria Gonorrhea: POSITIVE — AB

## 2023-01-22 ENCOUNTER — Ambulatory Visit (INDEPENDENT_AMBULATORY_CARE_PROVIDER_SITE_OTHER): Payer: Medicaid Other

## 2023-01-22 ENCOUNTER — Encounter: Payer: Self-pay | Admitting: Certified Nurse Midwife

## 2023-01-22 VITALS — BP 113/76 | HR 89 | Wt 295.6 lb

## 2023-01-22 DIAGNOSIS — Z3A18 18 weeks gestation of pregnancy: Secondary | ICD-10-CM

## 2023-01-22 DIAGNOSIS — O98212 Gonorrhea complicating pregnancy, second trimester: Secondary | ICD-10-CM

## 2023-01-22 DIAGNOSIS — O98219 Gonorrhea complicating pregnancy, unspecified trimester: Secondary | ICD-10-CM | POA: Insufficient documentation

## 2023-01-22 DIAGNOSIS — J4540 Moderate persistent asthma, uncomplicated: Secondary | ICD-10-CM

## 2023-01-22 DIAGNOSIS — O0990 Supervision of high risk pregnancy, unspecified, unspecified trimester: Secondary | ICD-10-CM

## 2023-01-22 LAB — CULTURE, OB URINE

## 2023-01-22 MED ORDER — CEFTRIAXONE SODIUM 500 MG IJ SOLR
500.0000 mg | Freq: Once | INTRAMUSCULAR | Status: AC
Start: 2023-01-22 — End: 2023-01-22
  Administered 2023-01-22: 500 mg via INTRAMUSCULAR

## 2023-01-22 MED ORDER — BLOOD PRESSURE KIT DEVI
1.0000 | Freq: Once | 0 refills | Status: AC
Start: 2023-01-22 — End: 2023-01-22

## 2023-01-22 MED ORDER — ALBUTEROL SULFATE HFA 108 (90 BASE) MCG/ACT IN AERS
1.0000 | INHALATION_SPRAY | Freq: Four times a day (QID) | RESPIRATORY_TRACT | 1 refills | Status: DC | PRN
Start: 2023-01-22 — End: 2023-06-10
  Filled 2023-03-15: qty 18, 25d supply, fill #0

## 2023-01-22 MED ORDER — FLUTICASONE PROPIONATE HFA 44 MCG/ACT IN AERO
2.0000 | INHALATION_SPRAY | Freq: Two times a day (BID) | RESPIRATORY_TRACT | 1 refills | Status: DC
Start: 2023-01-22 — End: 2023-04-29
  Filled 2023-03-15: qty 10.6, 30d supply, fill #0

## 2023-01-22 NOTE — Progress Notes (Deleted)
295.6   Blood pressure  Bbayscripts

## 2023-01-22 NOTE — Telephone Encounter (Signed)
Open in error

## 2023-01-22 NOTE — Progress Notes (Signed)
[name] is here for treatment of Gonorrhea at 18w 6d of pregnancy. Current weight 295.6 lbs. Rocephin 500 mg IM ordered per protocol. History of penicillin allergy reviewed with Eckstat MD. Patient has tolerated cephalexin and ceftriaxone in past with no issue, so will give Rocephin today. Injection administered. Pt monitored for 15 minutes following injection; no symptoms of reaction prior to end of visit.  Reports pain in low back and upper right leg. Encouraged regular stretching and follow up with provider at visit next week.   Reports continued shortness of breath after visit to [facility] on 01/19/23 for upper respiratory virus. Has been taking albuterol inhaler twice daily since that visit. Reviewed with Crissie Reese MD who recommends Flovent 88 mcg BID until no longer experiencing increased SOB. Refill also sent of albuterol inhaler for continued use as needed.  Marjo Bicker, RN 01/22/2023  2:56 PM

## 2023-01-22 NOTE — Patient Instructions (Signed)
[facility] [address], [place], [place] [address] [phone] Hours: Sunday Closed Monday 9AM-6PM Tuesday 9AM-6PM Wednesday 9AM-6PM Thursday 9AM-6PM Friday           9AM-6PM Saturday         10AM-1PM  

## 2023-01-23 ENCOUNTER — Encounter: Payer: Self-pay | Admitting: *Deleted

## 2023-01-24 ENCOUNTER — Other Ambulatory Visit: Payer: Medicaid Other

## 2023-01-25 ENCOUNTER — Encounter: Payer: Self-pay | Admitting: *Deleted

## 2023-01-25 ENCOUNTER — Ambulatory Visit: Payer: Medicaid Other | Admitting: *Deleted

## 2023-01-25 ENCOUNTER — Ambulatory Visit: Payer: Medicaid Other | Attending: Maternal & Fetal Medicine

## 2023-01-25 ENCOUNTER — Other Ambulatory Visit: Payer: Self-pay | Admitting: *Deleted

## 2023-01-25 VITALS — BP 112/72 | HR 97

## 2023-01-25 DIAGNOSIS — Z794 Long term (current) use of insulin: Secondary | ICD-10-CM | POA: Diagnosis not present

## 2023-01-25 DIAGNOSIS — O24111 Pre-existing diabetes mellitus, type 2, in pregnancy, first trimester: Secondary | ICD-10-CM

## 2023-01-25 DIAGNOSIS — O0990 Supervision of high risk pregnancy, unspecified, unspecified trimester: Secondary | ICD-10-CM | POA: Insufficient documentation

## 2023-01-25 DIAGNOSIS — Z3A19 19 weeks gestation of pregnancy: Secondary | ICD-10-CM | POA: Diagnosis not present

## 2023-01-25 DIAGNOSIS — O285 Abnormal chromosomal and genetic finding on antenatal screening of mother: Secondary | ICD-10-CM | POA: Diagnosis not present

## 2023-01-25 DIAGNOSIS — O98212 Gonorrhea complicating pregnancy, second trimester: Secondary | ICD-10-CM

## 2023-01-25 DIAGNOSIS — D259 Leiomyoma of uterus, unspecified: Secondary | ICD-10-CM | POA: Diagnosis not present

## 2023-01-25 DIAGNOSIS — O99212 Obesity complicating pregnancy, second trimester: Secondary | ICD-10-CM

## 2023-01-25 DIAGNOSIS — E669 Obesity, unspecified: Secondary | ICD-10-CM | POA: Diagnosis not present

## 2023-01-25 DIAGNOSIS — O10012 Pre-existing essential hypertension complicating pregnancy, second trimester: Secondary | ICD-10-CM

## 2023-01-25 DIAGNOSIS — Z362 Encounter for other antenatal screening follow-up: Secondary | ICD-10-CM

## 2023-01-25 DIAGNOSIS — O09522 Supervision of elderly multigravida, second trimester: Secondary | ICD-10-CM

## 2023-01-25 DIAGNOSIS — O10911 Unspecified pre-existing hypertension complicating pregnancy, first trimester: Secondary | ICD-10-CM | POA: Diagnosis present

## 2023-01-25 DIAGNOSIS — O0942 Supervision of pregnancy with grand multiparity, second trimester: Secondary | ICD-10-CM | POA: Diagnosis not present

## 2023-01-25 DIAGNOSIS — O24112 Pre-existing diabetes mellitus, type 2, in pregnancy, second trimester: Secondary | ICD-10-CM

## 2023-01-25 DIAGNOSIS — O3412 Maternal care for benign tumor of corpus uteri, second trimester: Secondary | ICD-10-CM | POA: Diagnosis not present

## 2023-01-25 DIAGNOSIS — O99211 Obesity complicating pregnancy, first trimester: Secondary | ICD-10-CM | POA: Diagnosis present

## 2023-01-25 DIAGNOSIS — O10912 Unspecified pre-existing hypertension complicating pregnancy, second trimester: Secondary | ICD-10-CM

## 2023-01-28 ENCOUNTER — Ambulatory Visit (INDEPENDENT_AMBULATORY_CARE_PROVIDER_SITE_OTHER): Payer: Medicaid Other | Admitting: Obstetrics & Gynecology

## 2023-01-28 ENCOUNTER — Other Ambulatory Visit: Payer: Self-pay

## 2023-01-28 ENCOUNTER — Telehealth: Payer: Self-pay | Admitting: Lactation Services

## 2023-01-28 ENCOUNTER — Other Ambulatory Visit: Payer: Self-pay | Admitting: Lactation Services

## 2023-01-28 ENCOUNTER — Encounter: Payer: Self-pay | Admitting: Obstetrics & Gynecology

## 2023-01-28 VITALS — BP 123/86 | HR 92 | Wt 296.0 lb

## 2023-01-28 DIAGNOSIS — O10912 Unspecified pre-existing hypertension complicating pregnancy, second trimester: Secondary | ICD-10-CM

## 2023-01-28 DIAGNOSIS — Z6841 Body Mass Index (BMI) 40.0 and over, adult: Secondary | ICD-10-CM

## 2023-01-28 DIAGNOSIS — O09529 Supervision of elderly multigravida, unspecified trimester: Secondary | ICD-10-CM

## 2023-01-28 DIAGNOSIS — O24112 Pre-existing diabetes mellitus, type 2, in pregnancy, second trimester: Secondary | ICD-10-CM

## 2023-01-28 DIAGNOSIS — O09522 Supervision of elderly multigravida, second trimester: Secondary | ICD-10-CM

## 2023-01-28 DIAGNOSIS — O10919 Unspecified pre-existing hypertension complicating pregnancy, unspecified trimester: Secondary | ICD-10-CM

## 2023-01-28 DIAGNOSIS — O24119 Pre-existing diabetes mellitus, type 2, in pregnancy, unspecified trimester: Secondary | ICD-10-CM

## 2023-01-28 DIAGNOSIS — O0992 Supervision of high risk pregnancy, unspecified, second trimester: Secondary | ICD-10-CM

## 2023-01-28 DIAGNOSIS — Z3A19 19 weeks gestation of pregnancy: Secondary | ICD-10-CM

## 2023-01-28 DIAGNOSIS — O0990 Supervision of high risk pregnancy, unspecified, unspecified trimester: Secondary | ICD-10-CM

## 2023-01-28 MED ORDER — HUMULIN N KWIKPEN 100 UNIT/ML ~~LOC~~ SUPN
PEN_INJECTOR | SUBCUTANEOUS | 11 refills | Status: DC
Start: 2023-01-28 — End: 2023-02-12

## 2023-01-28 NOTE — Telephone Encounter (Signed)
Received fax from Pharmacy that Novolin not covered by Insurance. Spoke with Heywood Bene and reviewed Medicaid Formulary and may need change to Humulin N instead. Please advise.

## 2023-01-28 NOTE — Progress Notes (Signed)
   PRENATAL VISIT NOTE  Subjective:  Monique Schmidt is a 37 y.o. [id number] at [redacted]w[redacted]d being seen today for ongoing prenatal care.  She is currently monitored for the following issues for this high-risk pregnancy and has BMI 50.0-59.9, adult (HCC); Supervision of high risk pregnancy, antepartum; AMA (advanced maternal age) multigravida 35+, unspecified trimester; Alpha thalassemia silent carrier; Type 2 diabetes mellitus affecting pregnancy, antepartum; Chronic hypertension affecting pregnancy; Uterine fibroid in pregnancy; and Gonorrhea affecting pregnancy on their problem list.  Patient reports no complaints.  Contractions: Not present. Vag. Bleeding: None.  Movement: Present. Denies leaking of fluid.   The following portions of the patient's history were reviewed and updated as appropriate: allergies, current medications, past family history, past medical history, past social history, past surgical history and problem list.   Objective:   Vitals:   01/28/23 1544  BP: 123/86  Pulse: 92  Weight: 296 lb (134.3 kg)    Fetal Status: Fetal Heart Rate (bpm): 154   Movement: Present     General:  Alert, oriented and cooperative. Patient is in no acute distress.  Skin: Skin is warm and dry. No rash noted.   Cardiovascular: Normal heart rate noted  Respiratory: Normal respiratory effort, no problems with respiration noted  Abdomen: Soft, gravid, appropriate for gestational age.  Pain/Pressure: Absent     Pelvic: Cervical exam deferred        Extremities: Normal range of motion.  Edema: None  Mental Status: Normal mood and affect. Normal behavior. Normal judgment and thought content.   Assessment and Plan:  Pregnancy: [id number] at [redacted]w[redacted]d 1. Type 2 diabetes mellitus affecting pregnancy, antepartum PP to 250 and fasting 160-180 - Insulin NPH, Human,, Isophane, (HUMULIN N KWIKPEN) 100 UNIT/ML Kiwkpen; 24 units at 8am and 24 units at 10pm  Dispense: 15 mL; Refill: 11 - Ambulatory referral to  Nutrition and Diabetic Education Consider Omnipod, adding short acting 2. Chronic hypertension affecting pregnancy stable  3. Supervision of high risk pregnancy, antepartum    4. BMI 50.0-59.9, adult (HCC) Body mass index is 47.78 kg/m.  5. AMA (advanced maternal age) multigravida 35+, unspecified trimester 37 y.o.   Preterm labor symptoms and general obstetric precautions including but not limited to vaginal bleeding, contractions, leaking of fluid and fetal movement were reviewed in detail with the patient. Please refer to After Visit Summary for other counseling recommendations.   Return in about 2 weeks (around 02/11/2023).  Future Appointments  Date Time Provider Department Center  03/01/2023 12:30 PM [facility] NURSE [facility]  03/01/2023 12:45 PM [facility] US4 [facility]    Scheryl Darter, MD

## 2023-01-30 ENCOUNTER — Telehealth: Payer: Self-pay | Admitting: Lactation Services

## 2023-01-30 NOTE — Telephone Encounter (Signed)
Called patient to discuss if she was able to get her Humulin N Kwikpen? It looks like it is not covered by Medicaid. I will try to get it covered, however I cannot guarantee that I can.   Thanks Jasmine December, RN

## 2023-02-05 ENCOUNTER — Telehealth: Payer: Self-pay | Admitting: Family Medicine

## 2023-02-05 NOTE — Telephone Encounter (Signed)
Want to discuss test results

## 2023-02-07 NOTE — Telephone Encounter (Signed)
Left message for pt that I am returning her call if she continue to have questions or concerns to please give the office a call back or send message through MyChart.    Leonette Nutting  02/07/23

## 2023-02-12 ENCOUNTER — Telehealth: Payer: Self-pay

## 2023-02-12 DIAGNOSIS — O24119 Pre-existing diabetes mellitus, type 2, in pregnancy, unspecified trimester: Secondary | ICD-10-CM

## 2023-02-12 MED ORDER — INSULIN NPH (HUMAN) (ISOPHANE) 100 UNIT/ML ~~LOC~~ SUSP
24.0000 [IU] | Freq: Two times a day (BID) | SUBCUTANEOUS | 6 refills | Status: DC
Start: 2023-02-12 — End: 2023-03-14

## 2023-02-12 MED ORDER — "INSULIN SYRINGE 31G X 5/16"" 1 ML MISC"
1.0000 | Freq: Two times a day (BID) | 6 refills | Status: DC
  Filled 2023-02-13: qty 100, 30d supply, fill #0
  Filled 2023-03-12 (×2): qty 100, 30d supply, fill #1
  Filled 2023-05-14: qty 100, 30d supply, fill #2

## 2023-02-12 MED ORDER — INSULIN SYRINGE-NEEDLE U-100 31G X 5/16" 0.5 ML MISC
1.0000 | Freq: Two times a day (BID) | 3 refills | Status: DC
Start: 2023-02-12 — End: 2023-02-12

## 2023-02-12 NOTE — Telephone Encounter (Signed)
(  Key: [id number])  The [employer] is reviewing your PA request. Typically an electronic response will be received within 24-72 hours. To check for an update later, open this request from your dashboard.  You may close this dialog and return to your dashboard to perform other tasks.

## 2023-02-12 NOTE — Telephone Encounter (Signed)
We received a prior authorization request for the member and product listed above. The [facility] Prior Authorization Team is not able to review this request because it is a duplicate of a previous request and contains identical clinical information. Please note, this product was denied and decision letters were sent on 01/30/2023. Refer to the original case [id number] and if there is additional clinical information to add/review, please resubmit for further consideration. If there is not any new clinical information, please review the original decision letter for alternatives and/or next steps.

## 2023-02-12 NOTE — Addendum Note (Signed)
Addended by: Marjo Bicker on: 02/12/2023 05:16 PM   Modules accepted: Orders

## 2023-02-12 NOTE — Telephone Encounter (Signed)
Patient called and spoke with front office. Pt needs to be called back to discuss medications. Called pt who states she is unable to pick up kwikpen. Was able to pick up Humulin N Kwikpen last month. Pharmacy states this was approved for a transition dose and will need PA for future refills. Humulin Rennis Petty is now listed as non-preferred on most updated [place] Medicaid Formulary (updated 01/11/23).   Reviewed with Jasmine December RN who states initial PA denied which is why PA by [facility] CMA was a duplicate. Sent Humulin N vials to pharmacy for patient to pick up and continue most recent dosage ordered by Debroah Loop MD (24 units 8 AM, 24 units 10 PM).

## 2023-02-13 ENCOUNTER — Other Ambulatory Visit (HOSPITAL_COMMUNITY): Payer: Self-pay

## 2023-02-13 ENCOUNTER — Other Ambulatory Visit: Payer: Self-pay

## 2023-02-14 ENCOUNTER — Other Ambulatory Visit: Payer: Self-pay

## 2023-02-15 ENCOUNTER — Other Ambulatory Visit: Payer: Self-pay

## 2023-02-15 DIAGNOSIS — O0990 Supervision of high risk pregnancy, unspecified, unspecified trimester: Secondary | ICD-10-CM | POA: Diagnosis not present

## 2023-02-18 ENCOUNTER — Ambulatory Visit (INDEPENDENT_AMBULATORY_CARE_PROVIDER_SITE_OTHER): Payer: Medicaid Other | Admitting: Family Medicine

## 2023-02-18 VITALS — BP 123/81 | HR 103 | Wt 300.4 lb

## 2023-02-18 DIAGNOSIS — O09522 Supervision of elderly multigravida, second trimester: Secondary | ICD-10-CM

## 2023-02-18 DIAGNOSIS — O0990 Supervision of high risk pregnancy, unspecified, unspecified trimester: Secondary | ICD-10-CM

## 2023-02-18 DIAGNOSIS — Z3A22 22 weeks gestation of pregnancy: Secondary | ICD-10-CM

## 2023-02-18 DIAGNOSIS — O10912 Unspecified pre-existing hypertension complicating pregnancy, second trimester: Secondary | ICD-10-CM

## 2023-02-18 DIAGNOSIS — O10919 Unspecified pre-existing hypertension complicating pregnancy, unspecified trimester: Secondary | ICD-10-CM

## 2023-02-18 DIAGNOSIS — O24112 Pre-existing diabetes mellitus, type 2, in pregnancy, second trimester: Secondary | ICD-10-CM | POA: Diagnosis not present

## 2023-02-18 DIAGNOSIS — O0992 Supervision of high risk pregnancy, unspecified, second trimester: Secondary | ICD-10-CM

## 2023-02-18 DIAGNOSIS — O24119 Pre-existing diabetes mellitus, type 2, in pregnancy, unspecified trimester: Secondary | ICD-10-CM

## 2023-02-18 DIAGNOSIS — O09529 Supervision of elderly multigravida, unspecified trimester: Secondary | ICD-10-CM

## 2023-02-18 NOTE — Progress Notes (Signed)
   PRENATAL VISIT NOTE  Subjective:  Monique Schmidt is a 37 y.o. G7P5015 at [redacted]w[redacted]d being seen today for ongoing prenatal care.  She is currently monitored for the following issues for this high-risk pregnancy and has BMI 50.0-59.9, adult (HCC); Supervision of high risk pregnancy, antepartum; AMA (advanced maternal age) multigravida 35+, unspecified trimester; Alpha thalassemia silent carrier; Type 2 diabetes mellitus affecting pregnancy, antepartum; Chronic hypertension affecting pregnancy; Uterine fibroid in pregnancy; and Gonorrhea affecting pregnancy on their problem list.  Patient reports no complaints.  Contractions: Irritability. Vag. Bleeding: None.  Movement: Present. Denies leaking of fluid.   The following portions of the patient's history were reviewed and updated as appropriate: allergies, current medications, past family history, past medical history, past social history, past surgical history and problem list.   Objective:   Vitals:   02/18/23 1120  BP: 123/81  Pulse: (!) 103  Weight: (!) 300 lb 6.4 oz (136.3 kg)    Fetal Status: Fetal Heart Rate (bpm): 148   Movement: Present     General:  Alert, oriented and cooperative. Patient is in no acute distress.  Skin: Skin is warm and dry. No rash noted.   Cardiovascular: Normal heart rate noted  Respiratory: Normal respiratory effort, no problems with respiration noted  Abdomen: Soft, gravid, appropriate for gestational age.  Pain/Pressure: Absent     Pelvic: Cervical exam deferred        Extremities: Normal range of motion.  Edema: None  Mental Status: Normal mood and affect. Normal behavior. Normal judgment and thought content.   Assessment and Plan:  Pregnancy: U9W1191 at [redacted]w[redacted]d 1. Chronic hypertension affecting pregnancy On Procardia -  On ASA  2. Type 2 diabetes mellitus affecting pregnancy, antepartum No sugar log.  Out of insulin x 4 days Consider CGM and insulin pump  3. Supervision of high risk pregnancy,  antepartum - AFP, Serum, Open Spina Bifida  4. AMA (advanced maternal age) multigravida 35+, unspecified trimester Nml u/s NIPT is abnormal.  Preterm labor symptoms and general obstetric precautions including but not limited to vaginal bleeding, contractions, leaking of fluid and fetal movement were reviewed in detail with the patient. Please refer to After Visit Summary for other counseling recommendations.   Return in 2 weeks (on 03/04/2023) for see diabetes educator for consideration of CGM and insulin pump.  Future Appointments  Date Time Provider Department Center  03/01/2023 12:30 PM [facility] NURSE [facility]  03/01/2023 12:45 PM [facility] US4 [facility]  03/05/2023 10:15 AM [facility]  03/14/2023 10:15 AM Reva Bores, MD [facility]    Reva Bores, MD

## 2023-02-20 LAB — AFP, SERUM, OPEN SPINA BIFIDA: Maternal Age At EDD: 39.2 yr

## 2023-02-22 LAB — AFP, SERUM, OPEN SPINA BIFIDA
AFP MoM: 1.06
AFP Value: 51.4 ng/mL
Gest. Age on Collection Date: 22.5 weeks
Test Results:: NEGATIVE
Weight: 300 [lb_av]

## 2023-02-26 DIAGNOSIS — O280 Abnormal hematological finding on antenatal screening of mother: Secondary | ICD-10-CM | POA: Insufficient documentation

## 2023-02-26 DIAGNOSIS — O285 Abnormal chromosomal and genetic finding on antenatal screening of mother: Secondary | ICD-10-CM | POA: Insufficient documentation

## 2023-02-26 HISTORY — DX: Abnormal hematological finding on antenatal screening of mother: O28.0

## 2023-03-01 ENCOUNTER — Ambulatory Visit: Payer: Medicaid Other | Attending: Obstetrics and Gynecology

## 2023-03-01 ENCOUNTER — Other Ambulatory Visit: Payer: Self-pay | Admitting: *Deleted

## 2023-03-01 ENCOUNTER — Ambulatory Visit (HOSPITAL_BASED_OUTPATIENT_CLINIC_OR_DEPARTMENT_OTHER): Payer: Medicaid Other | Admitting: Maternal & Fetal Medicine

## 2023-03-01 ENCOUNTER — Ambulatory Visit: Payer: Medicaid Other | Admitting: *Deleted

## 2023-03-01 ENCOUNTER — Other Ambulatory Visit: Payer: Self-pay | Admitting: Obstetrics and Gynecology

## 2023-03-01 VITALS — BP 132/78 | HR 94

## 2023-03-01 DIAGNOSIS — O24112 Pre-existing diabetes mellitus, type 2, in pregnancy, second trimester: Secondary | ICD-10-CM | POA: Diagnosis not present

## 2023-03-01 DIAGNOSIS — O98212 Gonorrhea complicating pregnancy, second trimester: Secondary | ICD-10-CM

## 2023-03-01 DIAGNOSIS — D563 Thalassemia minor: Secondary | ICD-10-CM | POA: Diagnosis not present

## 2023-03-01 DIAGNOSIS — Z362 Encounter for other antenatal screening follow-up: Secondary | ICD-10-CM | POA: Diagnosis present

## 2023-03-01 DIAGNOSIS — D259 Leiomyoma of uterus, unspecified: Secondary | ICD-10-CM | POA: Diagnosis not present

## 2023-03-01 DIAGNOSIS — O0942 Supervision of pregnancy with grand multiparity, second trimester: Secondary | ICD-10-CM | POA: Diagnosis not present

## 2023-03-01 DIAGNOSIS — O99012 Anemia complicating pregnancy, second trimester: Secondary | ICD-10-CM

## 2023-03-01 DIAGNOSIS — Z3A24 24 weeks gestation of pregnancy: Secondary | ICD-10-CM

## 2023-03-01 DIAGNOSIS — O99212 Obesity complicating pregnancy, second trimester: Secondary | ICD-10-CM | POA: Diagnosis not present

## 2023-03-01 DIAGNOSIS — O0990 Supervision of high risk pregnancy, unspecified, unspecified trimester: Secondary | ICD-10-CM | POA: Insufficient documentation

## 2023-03-01 DIAGNOSIS — O09522 Supervision of elderly multigravida, second trimester: Secondary | ICD-10-CM

## 2023-03-01 DIAGNOSIS — O280 Abnormal hematological finding on antenatal screening of mother: Secondary | ICD-10-CM | POA: Diagnosis present

## 2023-03-01 DIAGNOSIS — O09529 Supervision of elderly multigravida, unspecified trimester: Secondary | ICD-10-CM

## 2023-03-01 DIAGNOSIS — O24119 Pre-existing diabetes mellitus, type 2, in pregnancy, unspecified trimester: Secondary | ICD-10-CM | POA: Diagnosis present

## 2023-03-01 DIAGNOSIS — O365990 Maternal care for other known or suspected poor fetal growth, unspecified trimester, not applicable or unspecified: Secondary | ICD-10-CM | POA: Diagnosis present

## 2023-03-01 DIAGNOSIS — E1169 Type 2 diabetes mellitus with other specified complication: Secondary | ICD-10-CM

## 2023-03-01 DIAGNOSIS — O10912 Unspecified pre-existing hypertension complicating pregnancy, second trimester: Secondary | ICD-10-CM

## 2023-03-01 DIAGNOSIS — O10012 Pre-existing essential hypertension complicating pregnancy, second trimester: Secondary | ICD-10-CM

## 2023-03-01 DIAGNOSIS — O3410 Maternal care for benign tumor of corpus uteri, unspecified trimester: Secondary | ICD-10-CM | POA: Insufficient documentation

## 2023-03-01 DIAGNOSIS — E669 Obesity, unspecified: Secondary | ICD-10-CM

## 2023-03-01 DIAGNOSIS — O365920 Maternal care for other known or suspected poor fetal growth, second trimester, not applicable or unspecified: Secondary | ICD-10-CM

## 2023-03-01 DIAGNOSIS — Z794 Long term (current) use of insulin: Secondary | ICD-10-CM

## 2023-03-01 NOTE — Progress Notes (Signed)
Patient information  Patient Name: Monique Schmidt  Patient MRN:   [id number]  Referring practice: MFM Referring Provider: [facility])  MFM CONSULT  [name] is a 37 y.o. H0Q6578 at [redacted]w[redacted]d here for ultrasound and consultation.   I discussed the finding of fetal growth restriction (FGR) with the patient today. The ultrasound shows an overall growth at the 6th percentile and the abdominal circumference at the 31st percentile. The umbilical artery Dopplers are normal. I counseled her about the clinical significance of the Doppler findings and antenatal testing. I discussed the various causes of growth restriction including constitutionally small fetus, placental insufficiency, genetic problems and chronic maternal disease. Currently there is no evidence of sonographic stigmata suggesting infection or aneuploidy.  I discussed the most likely cause of her fetal growth restriction is either a constitutionally small fetus or placental insufficiency. We discussed it is often challenging to differentiate between a fetus that is constitutionally small but is fulfilling its growth potential and a fetus that is not fulfilling its growth potential because of an underlying pathologic condition. Approximately 70% of fetuses with birth weight below the 10th percentile for gestational age are constitutionally small; in the remaining 30%, the cause of the small size is pathologic, meeting intrauterine growth restriction definition. I also discussed the importance of antenatal fetal surveillance including antenatal testing and umbilical artery Doppler assessment to reduce the risk of stillbirth.  I discussed the management going forward in the pregnancy with potential alteration in the timing of delivery. Currently she feels well and denies headache, vision changes, right upper quadrant pain, contractions, vaginal bleeding or loss of fluid.  She reports good fetal movement.  The  patient reports her blood sugars are not well-controlled.  Her postprandial blood sugars are in the 180s to 250s.  Her fasting blood sugars in the 90s to 120s.  She is currently taking NPH twice daily at 24 units.  I instructed her to increase to 28 units twice daily and increase by 2 units each day that her blood sugars are not at goal.  She is to decrease by 2 units if her blood sugars are ever less than 70 especially when fasting.  I suspect she needs a significant amount more insulin.  We also discussed that she could add regular acting insulin with meals to help reduce her glucose load.   Sonographic findings Single intrauterine pregnancy. Fetal cardiac activity:  Observed and appears normal. Presentation: Cephalic. Interval fetal anatomy appears normal. Fetal biometry shows the estimated fetal weight at the  6th percentile and the abdominal circumference at the  31st percentile.  Amniotic fluid volume: Within normal limits. MVP: 6.46 cm. Placenta: Posterior. Umbilical artery dopplers findings: -S/D:3.91 which are normal at this gestational age.  -Absent end-diastolic flow: No.  -Reversed end-diastolic flow:  No. Normal movement and tone.  Assessment/Plan FGR DM2 CHTN -Increase NPH to 28 units twice daily and increase by 2 units each day that her blood sugars are not at goal.  She is to decrease by 2 units if her blood sugars are ever less than 70 especially when fasting. Consider adding regular insulin to do split-dose weight based with NPH/Regular. I would start at 6 units with breakfast and dinner. NPH should be given with breakfast and then at 9pm.  - UA dopplers every 1-2 weeks - Weekly NST to start at 25 weeks and then twice weekly at 28 weeks if still FGR. Add BPPs at 30-32 weeks.  -  Betamethasone indicated if absent or reversed flow is seen or antenatal testing is abnormal in the future  - Serial growth [place] every 3 weeks until delivery  - Delivery likley around 36 weeks or sooner  if inidcated.   Review of Systems: A review of systems was performed and was negative except per HPI   Vitals and Physical Exam    03/01/2023   12:29 PM 02/18/2023   11:20 AM 01/28/2023    3:44 PM  Vitals with BMI  Weight  300 lbs 6 oz 296 lbs  BMI  48.51 47.8  Systolic 132 123 161  Diastolic 78 81 86  Pulse 94 103 92  Sitting comfortably on the sonogram table Nonlabored breathing Normal rate and rhythm Abdomen is nontender  Past pregnancies OB History  Gravida Para Term Preterm AB Living  7 5 5   1 5   SAB IAB Ectopic Multiple Live Births  1 0 0 0 5    # Outcome Date GA Lbr Len/2nd Weight Sex Type Anes PTL Lv  7 Current           6 Term 01/20/16 [redacted]w[redacted]d 00:55 / 00:08 9 lb 11.7 oz (4.414 kg) F Vag-Spont None  LIV     Birth Comments: none  5 Term 12/15/13 [redacted]w[redacted]d 06:43 / 00:02 8 lb 13.8 oz (4.02 kg) F Vag-Spont None  LIV  4 Term 06/26/12 [redacted]w[redacted]d 21:07 / 00:01 7 lb 2 oz (3.232 kg) F Vag-Spont None  LIV  3 Term 03/08/11 [redacted]w[redacted]d 01:27 / 00:02 8 lb 9 oz (3.884 kg) F Vag-Spont None  LIV     Birth Comments: none  2 Term 05/19/09 [redacted]w[redacted]d   F Vag-Spont None N LIV  1 SAB               I spent 45 minutes reviewing the patients chart, including labs and images as well as counseling the patient about her medical conditions. Greater than 50% of the time was spent in direct face-to-face patient counseling.  Braxton Feathers  MFM, [facility]   03/01/2023  1:56 PM

## 2023-03-05 ENCOUNTER — Other Ambulatory Visit: Payer: Medicaid Other

## 2023-03-05 ENCOUNTER — Encounter: Payer: Medicaid Other | Attending: Family Medicine | Admitting: Dietician

## 2023-03-05 VITALS — Ht 66.0 in | Wt 304.0 lb

## 2023-03-05 DIAGNOSIS — O24119 Pre-existing diabetes mellitus, type 2, in pregnancy, unspecified trimester: Secondary | ICD-10-CM | POA: Insufficient documentation

## 2023-03-05 NOTE — Progress Notes (Signed)
Patient was seen for Gestational Diabetes self-management on 03/05/2023     Start time 1615 and End time 1700  Patient is here today alone.  She would like to learn about CGM and pump options. We have a Dexcom G7 which she placed in the office and set up using a phone app.  Since, she has emailed me and stated that this has fallen off.  Will message MD to obtain orders for new Sensors as well as an Omnipod insulin pump as patient is interested in this for better blood glucose control.  Instructed patient to continue to use her blood glucose meter to check her blood sugar until she is able to put a new Sensor on. She states that she is having problems eating fruit and vegetables during this pregnancy.  Estimated due date: 06/19/2023; [redacted]w[redacted]d  Clinical: Medications: NPH insulin 30 units bid, prenatal vitamin Medical History: Type 2 Diabetes, GDM, HTN A1c 12.7% 11/19/22 and 12.9% 01/08/2022   Dietary and Lifestyle History: Patient has 5 daughters.  Physical Activity: walk Stress: high Sleep: good   24 hr Recall:  First Meal: smoked sausage dog, 1%milk Snack: Second meal:  corn dog, apple Snack: Third meal:  cheese burger (no bun), potato chips, apple Snack:  potato chips Beverages:  water, zero sugar soda, 1% milk  NUTRITION INTERVENTION  Nutrition education (E-1) on the following topics:   Initial Follow-up  []  []  Definition of Gestational Diabetes []  []  Why dietary management is important in controlling blood glucose []  []  Effects each nutrient has on blood glucose levels []  []  Simple carbohydrates vs complex carbohydrates [x]  []  Fluid intake [x]  []  Creating a balanced meal plan []  []  Carbohydrate counting  [x]  []  When to check blood glucose levels []  []  Proper blood glucose monitoring techniques []  []  Effect of stress and stress reduction techniques  [x]  []  Exercise effect on blood glucose levels, appropriate exercise during pregnancy []  []  Importance of limiting caffeine and  abstaining from alcohol and smoking [x]  []  Medications used for blood sugar control during pregnancy [x]  []  Hypoglycemia and rule of 15 []  []  Postpartum self care   Patient has a meter prior to visit. Patient is testing pre breakfast and 2 hours after each meal frequently. FBS: 169 this am Postprandial: 169-183 today  Patient instructed to monitor glucose levels: FBS: 60 - ? 95 mg/dL; 2 hour: ? 629 mg/dL  Patient received handouts: Pre-existing diabetes during pregnancy  Patient will be seen for follow-up in 2 weeks and she is to call when she receives her pump for training.

## 2023-03-06 ENCOUNTER — Other Ambulatory Visit: Payer: Self-pay | Admitting: General Practice

## 2023-03-06 ENCOUNTER — Telehealth: Payer: Self-pay | Admitting: Dietician

## 2023-03-06 DIAGNOSIS — O24119 Pre-existing diabetes mellitus, type 2, in pregnancy, unspecified trimester: Secondary | ICD-10-CM

## 2023-03-06 MED ORDER — OMNIPOD DASH PODS (GEN 4) MISC
1.0000 | 5 refills | Status: DC
Start: 2023-03-06 — End: 2023-04-29
  Filled 2023-03-15 – 2023-04-15 (×5): qty 15, 30d supply, fill #0

## 2023-03-06 MED ORDER — OMNIPOD DASH INTRO (GEN 4) KIT
1.0000 | PACK | Freq: Once | 0 refills | Status: AC
Start: 2023-03-06 — End: 2023-03-06

## 2023-03-06 MED ORDER — DEXCOM G7 SENSOR MISC
5 refills | Status: DC
Start: 2023-03-06 — End: 2024-04-16
  Filled 2023-03-15 – 2023-03-27 (×3): qty 3, 30d supply, fill #0
  Filled 2023-04-08 – 2023-04-19 (×3): qty 3, 30d supply, fill #1
  Filled 2023-05-27: qty 3, 30d supply, fill #2

## 2023-03-06 NOTE — Telephone Encounter (Signed)
Patient called. Sensor that patient placed yesterday in the office fell off.  Let her know that a new sensor prescription was available to be picked up at her pharmacy.  Instructed her how to end a sensor session.  Provided patient the Dexcom support number for questions.  Instructed her that she should put the sensor on clean skin.  Oran Rein, RD, LDN, CDCES

## 2023-03-08 ENCOUNTER — Ambulatory Visit: Payer: Medicaid Other | Admitting: *Deleted

## 2023-03-08 ENCOUNTER — Ambulatory Visit: Payer: Medicaid Other | Attending: Maternal & Fetal Medicine

## 2023-03-08 VITALS — BP 134/96 | HR 88

## 2023-03-08 DIAGNOSIS — E669 Obesity, unspecified: Secondary | ICD-10-CM

## 2023-03-08 DIAGNOSIS — O0990 Supervision of high risk pregnancy, unspecified, unspecified trimester: Secondary | ICD-10-CM | POA: Insufficient documentation

## 2023-03-08 DIAGNOSIS — O99212 Obesity complicating pregnancy, second trimester: Secondary | ICD-10-CM | POA: Diagnosis not present

## 2023-03-08 DIAGNOSIS — O365920 Maternal care for other known or suspected poor fetal growth, second trimester, not applicable or unspecified: Secondary | ICD-10-CM

## 2023-03-08 DIAGNOSIS — O281 Abnormal biochemical finding on antenatal screening of mother: Secondary | ICD-10-CM

## 2023-03-08 DIAGNOSIS — O98212 Gonorrhea complicating pregnancy, second trimester: Secondary | ICD-10-CM | POA: Diagnosis present

## 2023-03-08 DIAGNOSIS — O24112 Pre-existing diabetes mellitus, type 2, in pregnancy, second trimester: Secondary | ICD-10-CM | POA: Insufficient documentation

## 2023-03-08 DIAGNOSIS — D259 Leiomyoma of uterus, unspecified: Secondary | ICD-10-CM | POA: Diagnosis not present

## 2023-03-08 DIAGNOSIS — O09522 Supervision of elderly multigravida, second trimester: Secondary | ICD-10-CM | POA: Diagnosis not present

## 2023-03-08 DIAGNOSIS — O0942 Supervision of pregnancy with grand multiparity, second trimester: Secondary | ICD-10-CM | POA: Insufficient documentation

## 2023-03-08 DIAGNOSIS — O24113 Pre-existing diabetes mellitus, type 2, in pregnancy, third trimester: Secondary | ICD-10-CM

## 2023-03-08 DIAGNOSIS — Z3A25 25 weeks gestation of pregnancy: Secondary | ICD-10-CM

## 2023-03-08 DIAGNOSIS — O3412 Maternal care for benign tumor of corpus uteri, second trimester: Secondary | ICD-10-CM | POA: Diagnosis not present

## 2023-03-08 DIAGNOSIS — E119 Type 2 diabetes mellitus without complications: Secondary | ICD-10-CM

## 2023-03-08 DIAGNOSIS — O10012 Pre-existing essential hypertension complicating pregnancy, second trimester: Secondary | ICD-10-CM

## 2023-03-08 DIAGNOSIS — O10912 Unspecified pre-existing hypertension complicating pregnancy, second trimester: Secondary | ICD-10-CM | POA: Diagnosis present

## 2023-03-12 ENCOUNTER — Other Ambulatory Visit: Payer: Self-pay

## 2023-03-12 ENCOUNTER — Other Ambulatory Visit: Payer: Self-pay | Admitting: Advanced Practice Midwife

## 2023-03-13 ENCOUNTER — Other Ambulatory Visit: Payer: Self-pay

## 2023-03-14 ENCOUNTER — Other Ambulatory Visit: Payer: Self-pay

## 2023-03-14 ENCOUNTER — Ambulatory Visit: Payer: Medicaid Other | Attending: Maternal & Fetal Medicine

## 2023-03-14 ENCOUNTER — Ambulatory Visit: Payer: Medicaid Other

## 2023-03-14 ENCOUNTER — Other Ambulatory Visit (HOSPITAL_COMMUNITY)
Admission: RE | Admit: 2023-03-14 | Discharge: 2023-03-14 | Disposition: A | Discharge status: Home / Self Care | Payer: Medicaid Other | Source: Ambulatory Visit | Attending: Family Medicine | Admitting: Family Medicine

## 2023-03-14 ENCOUNTER — Ambulatory Visit (INDEPENDENT_AMBULATORY_CARE_PROVIDER_SITE_OTHER): Payer: Medicaid Other | Admitting: Family Medicine

## 2023-03-14 ENCOUNTER — Ambulatory Visit: Payer: Medicaid Other | Admitting: *Deleted

## 2023-03-14 ENCOUNTER — Other Ambulatory Visit: Payer: Self-pay | Admitting: *Deleted

## 2023-03-14 VITALS — BP 111/78 | HR 100 | Wt 303.2 lb

## 2023-03-14 VITALS — BP 111/76 | HR 95

## 2023-03-14 DIAGNOSIS — O09522 Supervision of elderly multigravida, second trimester: Secondary | ICD-10-CM | POA: Diagnosis not present

## 2023-03-14 DIAGNOSIS — O98212 Gonorrhea complicating pregnancy, second trimester: Secondary | ICD-10-CM | POA: Diagnosis present

## 2023-03-14 DIAGNOSIS — O0990 Supervision of high risk pregnancy, unspecified, unspecified trimester: Secondary | ICD-10-CM

## 2023-03-14 DIAGNOSIS — Z3A26 26 weeks gestation of pregnancy: Secondary | ICD-10-CM

## 2023-03-14 DIAGNOSIS — O365920 Maternal care for other known or suspected poor fetal growth, second trimester, not applicable or unspecified: Secondary | ICD-10-CM

## 2023-03-14 DIAGNOSIS — O99212 Obesity complicating pregnancy, second trimester: Secondary | ICD-10-CM | POA: Insufficient documentation

## 2023-03-14 DIAGNOSIS — O10912 Unspecified pre-existing hypertension complicating pregnancy, second trimester: Secondary | ICD-10-CM | POA: Insufficient documentation

## 2023-03-14 DIAGNOSIS — O24119 Pre-existing diabetes mellitus, type 2, in pregnancy, unspecified trimester: Secondary | ICD-10-CM

## 2023-03-14 DIAGNOSIS — O0942 Supervision of pregnancy with grand multiparity, second trimester: Secondary | ICD-10-CM | POA: Diagnosis not present

## 2023-03-14 DIAGNOSIS — O3412 Maternal care for benign tumor of corpus uteri, second trimester: Secondary | ICD-10-CM | POA: Insufficient documentation

## 2023-03-14 DIAGNOSIS — O10919 Unspecified pre-existing hypertension complicating pregnancy, unspecified trimester: Secondary | ICD-10-CM

## 2023-03-14 DIAGNOSIS — O365990 Maternal care for other known or suspected poor fetal growth, unspecified trimester, not applicable or unspecified: Secondary | ICD-10-CM

## 2023-03-14 DIAGNOSIS — O24112 Pre-existing diabetes mellitus, type 2, in pregnancy, second trimester: Secondary | ICD-10-CM

## 2023-03-14 DIAGNOSIS — D259 Leiomyoma of uterus, unspecified: Secondary | ICD-10-CM | POA: Diagnosis not present

## 2023-03-14 DIAGNOSIS — O09529 Supervision of elderly multigravida, unspecified trimester: Secondary | ICD-10-CM

## 2023-03-14 MED ORDER — INSULIN ASPART 100 UNIT/ML IJ SOLN
10.0000 [IU] | Freq: Three times a day (TID) | INTRAMUSCULAR | 12 refills | Status: AC
Start: 2023-03-14 — End: ?
  Filled 2023-03-19: qty 10, 34d supply, fill #0

## 2023-03-14 MED ORDER — INSULIN NPH (HUMAN) (ISOPHANE) 100 UNIT/ML ~~LOC~~ SUSP
33.0000 [IU] | Freq: Two times a day (BID) | SUBCUTANEOUS | 6 refills | Status: DC
  Filled 2023-03-18 – 2023-03-19 (×4): qty 20, 30d supply, fill #0

## 2023-03-14 NOTE — Progress Notes (Signed)
PRENATAL VISIT NOTE  Subjective:  Monique Schmidt is a 37 y.o. G7P5015 at [redacted]w[redacted]d being seen today for ongoing prenatal care.  She is currently monitored for the following issues for this high-risk pregnancy and has BMI 50.0-59.9, adult (HCC); Supervision of high risk pregnancy, antepartum; AMA (advanced maternal age) multigravida 35+, unspecified trimester; Alpha thalassemia silent carrier; Type 2 diabetes mellitus affecting pregnancy, antepartum; Chronic hypertension affecting pregnancy; Uterine fibroid in pregnancy; Gonorrhea affecting pregnancy; Abnormal maternal serum screening test; and Fetal growth restriction antepartum on their problem list.  Patient reports headache.  Contractions: Not present. Vag. Bleeding: None.  Movement: Present. Denies leaking of fluid.   The following portions of the patient's history were reviewed and updated as appropriate: allergies, current medications, past family history, past medical history, past social history, past surgical history and problem list.   Objective:   Vitals:   03/14/23 1025  BP: 111/78  Pulse: 100  Weight: (!) 303 lb 3.2 oz (137.5 kg)    Fetal Status: Fetal Heart Rate (bpm): 152   Movement: Present     General:  Alert, oriented and cooperative. Patient is in no acute distress.  Skin: Skin is warm and dry. No rash noted.   Cardiovascular: Normal heart rate noted  Respiratory: Normal respiratory effort, no problems with respiration noted  Abdomen: Soft, gravid, appropriate for gestational age.  Pain/Pressure: Present     Pelvic: Cervical exam deferred        Extremities: Normal range of motion.     Mental Status: Normal mood and affect. Normal behavior. Normal judgment and thought content.   Assessment and Plan:  Pregnancy: V7Q4696 at [redacted]w[redacted]d 1. Gonorrhea affecting pregnancy in second trimester TOC today - Cervicovaginal ancillary only( [place])  2. Type 2 diabetes mellitus affecting pregnancy, antepartum No log book  today Reports fastings CBGs 130s 2 hour pp 159 Awaiting pump-- Increased basal NPH and added meal coverage. - insulin aspart (NOVOLOG) 100 UNIT/ML injection; Inject 10 Units into the skin 3 (three) times daily with meals.  Dispense: 10 mL; Refill: 12 - insulin NPH Human (HUMULIN N) 100 UNIT/ML injection; Inject 0.33 mLs (33 Units total) into the skin 2 (two) times daily. Administer at 24 units at 8 AM and 24 units at 10 PM.  Dispense: 20 mL; Refill: 6 and 24 units at 10 PM.  Dispense: 20 mL; Refill: 6  3. Chronic hypertension affecting pregnancy On Nifedipine - BP looks good today Headache, unlikely to be related to Pre-E at this GA, though possible. Try flexeril--return if no improvement  4. Fetal growth restriction antepartum In weekly testing with MFM  5. AMA (advanced maternal age) multigravida 35+, unspecified trimester HR NIPT - has had genetics  Preterm labor symptoms and general obstetric precautions including but not limited to vaginal bleeding, contractions, leaking of fluid and fetal movement were reviewed in detail with the patient. Please refer to After Visit Summary for other counseling recommendations.   Return in 2 weeks (on 03/28/2023).  Future Appointments  Date Time Provider Department Center  03/19/2023  3:45 PM Bonnita Levan, RD [facility]  03/20/2023 10:00 AM Jessica Priest, RN [facility]  03/22/2023  3:30 PM [facility] NURSE [facility]  03/22/2023  3:45 PM [facility] US6 [facility]  03/27/2023  9:35 AM Warden Fillers, MD [facility]  03/28/2023  1:45 PM [facility] NURSE [facility]  03/28/2023  2:00 PM [facility] US1 [facility]  03/28/2023  3:15 PM [facility] NST [facility]  04/04/2023  10:30 AM [facility] N[facility]SE [facility]  04/04/2023 10:45 AM [facility] NST [facility]  04/04/2023 11:30 AM [facility] US2 [facility]  04/11/2023  1:00 PM [facility] NURSE [facility]  04/11/2023  1:15 PM [facility] NST [facility]  04/11/2023  2:30 PM [facility] US3 [facility]    Reva Bores,  MD

## 2023-03-14 NOTE — Progress Notes (Signed)
Patient here for ongoing prenatal care. She informed me that for the past 3 days, she's been having headaches. It'll go away for a while when she takes Tylenol but return within a few hours.  Denies vision disturbances   Did not bring glucose meter today.   Patient stated that she's been having abnormal vaginal odor and would like to be tested for BV. Self swab explain and offered, patient accepted. Specimen collected.  Alesia Morin   03/14/23

## 2023-03-15 ENCOUNTER — Other Ambulatory Visit: Payer: Self-pay

## 2023-03-15 MED ORDER — TERCONAZOLE 0.8 % VA CREA: 1.0000 | TOPICAL_CREAM | Freq: Every day | VAGINAL | 0 refills | Status: DC

## 2023-03-15 NOTE — Addendum Note (Signed)
Addended by: Reva Bores on: 03/15/2023 05:42 PM   Modules accepted: Orders

## 2023-03-16 ENCOUNTER — Other Ambulatory Visit: Payer: Self-pay

## 2023-03-18 ENCOUNTER — Other Ambulatory Visit: Payer: Self-pay | Admitting: Certified Nurse Midwife

## 2023-03-18 ENCOUNTER — Other Ambulatory Visit: Payer: Self-pay

## 2023-03-18 ENCOUNTER — Other Ambulatory Visit: Payer: Self-pay | Admitting: Family Medicine

## 2023-03-19 ENCOUNTER — Encounter: Payer: Self-pay | Admitting: Family Medicine

## 2023-03-19 ENCOUNTER — Ambulatory Visit: Payer: Medicaid Other | Admitting: Dietician

## 2023-03-19 ENCOUNTER — Other Ambulatory Visit: Payer: Self-pay

## 2023-03-19 ENCOUNTER — Other Ambulatory Visit (HOSPITAL_COMMUNITY): Payer: Self-pay

## 2023-03-19 ENCOUNTER — Other Ambulatory Visit: Payer: Self-pay | Admitting: Lactation Services

## 2023-03-19 ENCOUNTER — Telehealth: Payer: Self-pay | Admitting: Dietician

## 2023-03-19 MED ORDER — INSULIN ASPART 100 UNIT/ML IJ SOLN: INTRAMUSCULAR | 6 refills | Status: DC

## 2023-03-19 NOTE — Progress Notes (Signed)
Ordered Insulin per Dr. Crissie Reese and Oran Rein, RD

## 2023-03-19 NOTE — Telephone Encounter (Signed)
Called patient to remind her of her insulin pump training appointment tomorrow at 10 am.   Patient was not available.  Left her a message with number to call for questions.  Oran Rein, RD, LDN, CDCES

## 2023-03-20 ENCOUNTER — Telehealth: Payer: Self-pay | Admitting: Lactation Services

## 2023-03-20 ENCOUNTER — Other Ambulatory Visit: Payer: Self-pay

## 2023-03-20 ENCOUNTER — Telehealth (HOSPITAL_COMMUNITY): Payer: Self-pay | Admitting: Pharmacy Technician

## 2023-03-20 ENCOUNTER — Encounter: Payer: Medicaid Other | Admitting: Nutrition

## 2023-03-20 ENCOUNTER — Other Ambulatory Visit (HOSPITAL_COMMUNITY): Payer: Self-pay

## 2023-03-20 ENCOUNTER — Encounter: Payer: Self-pay | Admitting: *Deleted

## 2023-03-20 DIAGNOSIS — O24119 Pre-existing diabetes mellitus, type 2, in pregnancy, unspecified trimester: Secondary | ICD-10-CM

## 2023-03-20 NOTE — Telephone Encounter (Signed)
Pharmacy Patient Advocate Encounter  Received notification from [facility] MEDICAID that Prior Authorization for Dexcom G7 Sensor  has been APPROVED from 03/20/2023 to 09/20/2023. Ran test claim, Copay is $4.00  PA #/Case ID/Reference #: [id number]

## 2023-03-20 NOTE — Telephone Encounter (Signed)
Pharmacy Patient Advocate Encounter   Received notification that prior authorization for Dexcom G7 Sensor is required/requested.   Insurance verification completed.   The patient is insured through [facility] .   Per test claim: PA required; PA submitted to [facility] via CoverMyMeds Key/confirmation #/EOC [facility] Status is pending

## 2023-03-20 NOTE — Telephone Encounter (Signed)
Called and spoke with pharmacy to cancel original Humulin Insulin as new RX sent for Omnipod use yesterday.

## 2023-03-21 ENCOUNTER — Telehealth: Payer: Self-pay | Admitting: Lactation Services

## 2023-03-21 ENCOUNTER — Telehealth: Payer: Self-pay | Admitting: Nutrition

## 2023-03-21 ENCOUNTER — Other Ambulatory Visit: Payer: Self-pay

## 2023-03-21 NOTE — Telephone Encounter (Signed)
LVM to call me to let me know how she is doing with her pump.

## 2023-03-21 NOTE — Telephone Encounter (Signed)
Monique Schmidt (Key: [id number]) PA Case ID #: [id number] Rx #: [id number] Need Help? Call us at [phone] Outcome N/A today by [facility] 2017 NCPDP We received a prior authorization request for the member and product listed above. The [facility] Prior Authorization Team is not able to review this request because the requested product has been previously approved under [id number]. Based on the information reviewed, the requested prescription is currently authorized for coverage by the plan until 09/20/2023. Please resubmit this request within 30 days of authorization expiration date. Drug Dexcom G7 Sensor Form [employer] Electronic Prior Authorization Form [phone] NCPDP) Original Claim Info

## 2023-03-21 NOTE — Telephone Encounter (Signed)
LVM to call me to let me know how she did last night on her pump and to answer any questions she may have.

## 2023-03-21 NOTE — Telephone Encounter (Signed)
Si Raider (Key: [id number]) PA Case ID #: [id number] Rx #: [id number] Need Help? Call us at [phone] Status Sent to Plan today Dexcom G7 Sensor Form [employer] Electronic Prior Authorization Form [phone] NCPDP)

## 2023-03-21 NOTE — Patient Instructions (Signed)
Give a  bolus (insulin) for all meals and snacks Change pod every 3 days, or when is it empty Change dexcom every 10 days. Call OmniPod help line if questions about how to use the pump Call Dexcom help line if questions about how to use the sensors.

## 2023-03-21 NOTE — Progress Notes (Signed)
Patient is here to learn how to use/start the Dash pump.  She did not take her NPH insulin this AM.  We discussed how this pump delivers insulin and the fact that she will not need this NPH insulin while on the pump.  She reported good understanding of this. Settings were put into the pump by the patient per Dr. Audie Clear orders:  Basal rate: 1.4u/hr, max basal rate: 2.8u/hr, I/C ratio: 6, ISF: 25, duration of action: 4 hours, target blood sugar: MN:  less than 95, with corrections above 95 8AM: less than 120, with correctons above 120.  Max bolus: 12, and reverse correction on.     She was trained on how to fill a pod, apply the pod and how to do correction bolus and meal and snack boluses.   She filled a pod with insulin and attached the pod to her left abdomen.  She did a correction dose at 10:45 per pump with little assistance from me.  We reviwed all topics on the pump training sheet and she signed this as indicating good understanding with no final questions.   She was given a G7 sensor and this was trained on how to use this.  She downloaded the app to her phone and readings are going there for her her to see.  Discussed goals for blood sugar readings:  acB: less thank 90, ac meals: less than 100, and 2hr. Pc less than 140.  She reports that the pharmacy said she was denied this.  They were called and pharmacy said the prior auth for this went through, and patient is able to pick this up tomorrow.  She started this at 10:30 AM.   Her blood sugar was 210, 3hrs. pcB,  She had eaten Chereos for breakfast.  Discussed need to stop all cold cereal and milk, fruit juices and sweet drinks.  Other suggestions given for breakfast choices using 15 grams of carb and 14 grams protein.  Reviewed carb counting, what carbs are, and the need to limit carbs in the AM to 15 grams    Discussed importance of eating balanced meals of carbs, proteins and fats at each meal,with many vegetables at lunch and supper, also as HS  snack.  Discussed importance of taking a bolus before these meals to prevent blood sugars from going high after eating. Discussed signes/symptoms and treatments of low blood sugar as well.  She reported good understanding of this as well. Patient had no final questions.

## 2023-03-22 ENCOUNTER — Ambulatory Visit (HOSPITAL_BASED_OUTPATIENT_CLINIC_OR_DEPARTMENT_OTHER): Payer: Medicaid Other

## 2023-03-22 ENCOUNTER — Ambulatory Visit: Payer: Medicaid Other

## 2023-03-22 ENCOUNTER — Ambulatory Visit: Payer: Medicaid Other | Attending: Maternal & Fetal Medicine | Admitting: *Deleted

## 2023-03-22 ENCOUNTER — Telehealth: Payer: Self-pay | Admitting: Dietician

## 2023-03-22 ENCOUNTER — Telehealth: Payer: Self-pay | Admitting: Nutrition

## 2023-03-22 ENCOUNTER — Encounter: Payer: Self-pay | Admitting: *Deleted

## 2023-03-22 ENCOUNTER — Inpatient Hospital Stay (HOSPITAL_COMMUNITY)
Admission: AD | Admit: 2023-03-22 | Discharge: 2023-03-22 | Disposition: A | Discharge status: Home / Self Care | Payer: Medicaid Other | Attending: Obstetrics and Gynecology | Admitting: Obstetrics and Gynecology

## 2023-03-22 VITALS — BP 108/63 | HR 94

## 2023-03-22 DIAGNOSIS — O24112 Pre-existing diabetes mellitus, type 2, in pregnancy, second trimester: Secondary | ICD-10-CM

## 2023-03-22 DIAGNOSIS — O365920 Maternal care for other known or suspected poor fetal growth, second trimester, not applicable or unspecified: Secondary | ICD-10-CM | POA: Insufficient documentation

## 2023-03-22 DIAGNOSIS — O0942 Supervision of pregnancy with grand multiparity, second trimester: Secondary | ICD-10-CM | POA: Diagnosis not present

## 2023-03-22 DIAGNOSIS — O09522 Supervision of elderly multigravida, second trimester: Secondary | ICD-10-CM | POA: Diagnosis not present

## 2023-03-22 DIAGNOSIS — D259 Leiomyoma of uterus, unspecified: Secondary | ICD-10-CM | POA: Diagnosis not present

## 2023-03-22 DIAGNOSIS — Z794 Long term (current) use of insulin: Secondary | ICD-10-CM | POA: Diagnosis not present

## 2023-03-22 DIAGNOSIS — Z3A27 27 weeks gestation of pregnancy: Secondary | ICD-10-CM

## 2023-03-22 DIAGNOSIS — Z362 Encounter for other antenatal screening follow-up: Secondary | ICD-10-CM | POA: Insufficient documentation

## 2023-03-22 DIAGNOSIS — O99212 Obesity complicating pregnancy, second trimester: Secondary | ICD-10-CM

## 2023-03-22 DIAGNOSIS — O285 Abnormal chromosomal and genetic finding on antenatal screening of mother: Secondary | ICD-10-CM

## 2023-03-22 DIAGNOSIS — O24119 Pre-existing diabetes mellitus, type 2, in pregnancy, unspecified trimester: Secondary | ICD-10-CM

## 2023-03-22 DIAGNOSIS — O10012 Pre-existing essential hypertension complicating pregnancy, second trimester: Secondary | ICD-10-CM

## 2023-03-22 DIAGNOSIS — E669 Obesity, unspecified: Secondary | ICD-10-CM | POA: Diagnosis not present

## 2023-03-22 DIAGNOSIS — T85614A Breakdown (mechanical) of insulin pump, initial encounter: Secondary | ICD-10-CM

## 2023-03-22 DIAGNOSIS — O0990 Supervision of high risk pregnancy, unspecified, unspecified trimester: Secondary | ICD-10-CM

## 2023-03-22 DIAGNOSIS — O10912 Unspecified pre-existing hypertension complicating pregnancy, second trimester: Secondary | ICD-10-CM

## 2023-03-22 DIAGNOSIS — O98212 Gonorrhea complicating pregnancy, second trimester: Secondary | ICD-10-CM

## 2023-03-22 DIAGNOSIS — O3412 Maternal care for benign tumor of corpus uteri, second trimester: Secondary | ICD-10-CM | POA: Diagnosis not present

## 2023-03-22 DIAGNOSIS — E1169 Type 2 diabetes mellitus with other specified complication: Secondary | ICD-10-CM

## 2023-03-22 DIAGNOSIS — T85624A Displacement of insulin pump, initial encounter: Secondary | ICD-10-CM

## 2023-03-22 NOTE — MAU Note (Signed)
Pt in MAU for [place],CNM to assist her to replace her Omnipod. Pt's monitor not currently working so provider just spoke with the pt for a few minutes. Per provder pt denies pain, bleeding, or any other complaints. Per provider pt does not require triage. Provider sent pt out

## 2023-03-22 NOTE — Telephone Encounter (Signed)
Left voice mail.  Called to see how patient is doing on her insulin pump.  She was not available.  Oran Rein, RD, LDN, CDCES

## 2023-03-22 NOTE — MAU Provider Note (Signed)
S:  37 y.o. [id number] at [redacted]w[redacted]d presents to MAU sent from [facility] today because her Omnipod insulin pump came off and she is not sure how to put a new one on or program the phone to recognize the new pump.  While in MAU, the phone/device that came with the pump shut down and all her programmed information was lost.    O: VS reviewed, nursing note reviewed,  Constitutional: well developed, well nourished, no distress HEENT: normocephalic CV: normal rate Pulm/chest wall: normal effort Abdomen: soft Neuro: alert and oriented x 3 Skin: warm, dry Psych: affect normal   MDM:  Consult the diabetes educator, Carollee Herter, who suggests calling the pump manager or the Omnipod customer service to retrieve the data.  If that doesn't work, pt to resume her insulin dosing from prior to [facility] for 2 days and call endocrinology or the contact for pump management on Monday. Pt has NPH and short acting insulin at home. Her previous regimen was NPH 33 units in am and pm plus short acting insulin 10 units with each meal.  She has these supplies at home and enough insulin for the weekend.  Pt has Dexcom glucose monitor and parameters/reasons to come to MAU reviewed.  A/P: 1. Type 2 diabetes mellitus affecting pregnancy, antepartum   2. Displacement of insulin pump, initial encounter   3. Mechanical breakdown of insulin pump, initial encounter   4. [redacted] weeks gestation of pregnancy      D/C home  Sharen Counter, CNM 6:20 PM

## 2023-03-22 NOTE — Telephone Encounter (Signed)
Called patient and she told me that she was at the hospital because her pod fell off today and her blood sugar was high.  Said she is there now and that someone was coming to help her put the pod back on.  She reports having taken 15u of "the fast acting insulin and blood sugar was coming down.  I reminded her that she could have called the OmniPod 800 help line if she needed help with this, or if the diabetes nurse educator needed help with this today.Told her that I will call her on Monday to schedule another appointment for review of this, and she agreed to come.  Suggested she read over the starter booklet and the 800 number is on the back if she needs to contact them again before Monday.  She agreed to do this.

## 2023-03-25 ENCOUNTER — Telehealth: Payer: Self-pay | Admitting: Nutrition

## 2023-03-25 ENCOUNTER — Telehealth: Payer: Self-pay | Admitting: Dietician

## 2023-03-25 NOTE — Telephone Encounter (Signed)
LVM that I can see her today at 11:30, and to call me to confirm she is coming and to let me know the status of her pump usage

## 2023-03-25 NOTE — Telephone Encounter (Signed)
Patient reports that she is not on her pump at this time.  I have rescheduled her to come in tomorrow to restart her pump

## 2023-03-25 NOTE — Telephone Encounter (Signed)
Called patient to check on her post insulin pump.  She was at the hospital Friday due to her pod falling off and increased blood glucose.  She was not available.  Left a message for her to return my call. She was also to see another CDCES this am and I don't see that she came.  Oran Rein, RD, LDN, CDCES

## 2023-03-26 ENCOUNTER — Other Ambulatory Visit: Payer: Self-pay

## 2023-03-26 ENCOUNTER — Other Ambulatory Visit: Payer: Self-pay | Admitting: Family Medicine

## 2023-03-26 ENCOUNTER — Ambulatory Visit: Payer: Medicaid Other | Admitting: Nutrition

## 2023-03-26 MED ORDER — TERCONAZOLE 0.8 % VA CREA: 1.0000 | TOPICAL_CREAM | Freq: Every day | VAGINAL | 0 refills | Status: DC

## 2023-03-27 ENCOUNTER — Encounter: Payer: Medicaid Other | Attending: Family Medicine | Admitting: Nutrition

## 2023-03-27 ENCOUNTER — Ambulatory Visit (INDEPENDENT_AMBULATORY_CARE_PROVIDER_SITE_OTHER): Payer: Medicaid Other | Admitting: Obstetrics and Gynecology

## 2023-03-27 ENCOUNTER — Other Ambulatory Visit: Payer: Self-pay

## 2023-03-27 VITALS — BP 108/76 | HR 97 | Wt 307.0 lb

## 2023-03-27 DIAGNOSIS — Z6841 Body Mass Index (BMI) 40.0 and over, adult: Secondary | ICD-10-CM

## 2023-03-27 DIAGNOSIS — O365930 Maternal care for other known or suspected poor fetal growth, third trimester, not applicable or unspecified: Secondary | ICD-10-CM

## 2023-03-27 DIAGNOSIS — O3413 Maternal care for benign tumor of corpus uteri, third trimester: Secondary | ICD-10-CM

## 2023-03-27 DIAGNOSIS — M25561 Pain in right knee: Secondary | ICD-10-CM | POA: Diagnosis not present

## 2023-03-27 DIAGNOSIS — O0990 Supervision of high risk pregnancy, unspecified, unspecified trimester: Secondary | ICD-10-CM | POA: Diagnosis not present

## 2023-03-27 DIAGNOSIS — O10913 Unspecified pre-existing hypertension complicating pregnancy, third trimester: Secondary | ICD-10-CM

## 2023-03-27 DIAGNOSIS — O24119 Pre-existing diabetes mellitus, type 2, in pregnancy, unspecified trimester: Secondary | ICD-10-CM

## 2023-03-27 DIAGNOSIS — O365990 Maternal care for other known or suspected poor fetal growth, unspecified trimester, not applicable or unspecified: Secondary | ICD-10-CM

## 2023-03-27 DIAGNOSIS — D563 Thalassemia minor: Secondary | ICD-10-CM

## 2023-03-27 DIAGNOSIS — O09529 Supervision of elderly multigravida, unspecified trimester: Secondary | ICD-10-CM

## 2023-03-27 DIAGNOSIS — O09523 Supervision of elderly multigravida, third trimester: Secondary | ICD-10-CM

## 2023-03-27 DIAGNOSIS — O0993 Supervision of high risk pregnancy, unspecified, third trimester: Secondary | ICD-10-CM

## 2023-03-27 DIAGNOSIS — O24113 Pre-existing diabetes mellitus, type 2, in pregnancy, third trimester: Secondary | ICD-10-CM

## 2023-03-27 DIAGNOSIS — Z3A28 28 weeks gestation of pregnancy: Secondary | ICD-10-CM

## 2023-03-27 DIAGNOSIS — O10919 Unspecified pre-existing hypertension complicating pregnancy, unspecified trimester: Secondary | ICD-10-CM

## 2023-03-27 DIAGNOSIS — D259 Leiomyoma of uterus, unspecified: Secondary | ICD-10-CM

## 2023-03-27 NOTE — Progress Notes (Signed)
   PRENATAL VISIT NOTE  Subjective:  Monique Schmidt is a 37 y.o. G7P5015 at [redacted]w[redacted]d being seen today for ongoing prenatal care.  She is currently monitored for the following issues for this high-risk pregnancy and has BMI 50.0-59.9, adult (HCC); Supervision of high risk pregnancy, antepartum; AMA (advanced maternal age) multigravida 35+, unspecified trimester; Alpha thalassemia silent carrier; Type 2 diabetes mellitus affecting pregnancy, antepartum; Chronic hypertension affecting pregnancy; Uterine fibroid in pregnancy; Gonorrhea affecting pregnancy; and Fetal growth restriction antepartum on their problem list.  Patient doing well with no acute concerns today. She reports no complaints.  Contractions: Not present. Vag. Bleeding: None.  Movement: Present. Denies leaking of fluid.   The following portions of the patient's history were reviewed and updated as appropriate: allergies, current medications, past family history, past medical history, past social history, past surgical history and problem list. Problem list updated.  Objective:   Vitals:   03/27/23 0957  BP: 108/76  Pulse: 97  Weight: (!) 307 lb (139.3 kg)    Fetal Status: Fetal Heart Rate (bpm): 142   Movement: Present     General:  Alert, oriented and cooperative. Patient is in no acute distress.  Skin: Skin is warm and dry. No rash noted.   Cardiovascular: Normal heart rate noted  Respiratory: Normal respiratory effort, no problems with respiration noted  Abdomen: Soft, gravid, appropriate for gestational age.  Pain/Pressure: Absent     Pelvic: Cervical exam deferred        Extremities: Normal range of motion.  Edema: None  Mental Status:  Normal mood and affect. Normal behavior. Normal judgment and thought content.   Assessment and Plan:  Pregnancy: G7P5015 at [redacted]w[redacted]d  1. [redacted] weeks gestation of pregnancy   2. Chronic hypertension affecting pregnancy Well controlled on procardia  3. Type 2 diabetes mellitus affecting  pregnancy, antepartum Avg daily 208, omnipod has been off, pt getting it replaced now Pt strongly advised to get device replaced and record blood glucose, if values remain elevated, pt may need inpt management to optimize control Anticipate delivery at 37 weeks  4. Uterine fibroid in pregnancy   5. BMI 50.0-59.9, adult (HCC)   6. AMA (advanced maternal age) multigravida 35+, unspecified trimester   7. Alpha thalassemia silent carrier   8. Fetal growth restriction antepartum EFW at 2.5%, growth scan tomorrow  9. Supervision of high risk pregnancy, antepartum Continue routine prenatal care  Preterm labor symptoms and general obstetric precautions including but not limited to vaginal bleeding, contractions, leaking of fluid and fetal movement were reviewed in detail with the patient.  Please refer to After Visit Summary for other counseling recommendations.   Return in about 2 weeks (around 04/10/2023) for [facility], in person.   Mariel Aloe, MD Faculty Attending Center for [facility]

## 2023-03-27 NOTE — Progress Notes (Unsigned)
Patient was not able to get her pump restarted because the PDM data was lost.  Not sure if this was user error from the hospital staff, or patient, but data was reentered into the pdm and a new pod was started at 12PM.  Pump data:  Basal rate: 1.4u/hr, I/C: 6, ISF: 25, target 90 with correction over 95 from MN to 8AM, and 100 with correction over 120 from 8AM to MN.  Timing: 4 hours, max basal: 2.8, max bolus 12u, reverse correction is on. She did not take her NPH this AM, and did not take her aspart insulin before eating breakfast 1/5 hours ago.  Dexcom read 189.  She did a correction dose and 1 extra unit was added to make up for the basal rate missed until pump can catch up.  She did a correction dose and with do this again every hour until blood sugar comes down.  Explained that the PDM may say no insulin required, or may suggest a dose of insulin.  Stressed need to

## 2023-03-28 ENCOUNTER — Ambulatory Visit (HOSPITAL_BASED_OUTPATIENT_CLINIC_OR_DEPARTMENT_OTHER): Payer: Medicaid Other | Admitting: *Deleted

## 2023-03-28 ENCOUNTER — Ambulatory Visit: Payer: Medicaid Other | Admitting: *Deleted

## 2023-03-28 ENCOUNTER — Ambulatory Visit: Payer: Medicaid Other | Attending: Obstetrics and Gynecology

## 2023-03-28 VITALS — BP 122/75 | HR 98

## 2023-03-28 DIAGNOSIS — O99213 Obesity complicating pregnancy, third trimester: Secondary | ICD-10-CM

## 2023-03-28 DIAGNOSIS — O365930 Maternal care for other known or suspected poor fetal growth, third trimester, not applicable or unspecified: Secondary | ICD-10-CM | POA: Diagnosis not present

## 2023-03-28 DIAGNOSIS — O0943 Supervision of pregnancy with grand multiparity, third trimester: Secondary | ICD-10-CM | POA: Diagnosis not present

## 2023-03-28 DIAGNOSIS — O281 Abnormal biochemical finding on antenatal screening of mother: Secondary | ICD-10-CM | POA: Diagnosis not present

## 2023-03-28 DIAGNOSIS — O24112 Pre-existing diabetes mellitus, type 2, in pregnancy, second trimester: Secondary | ICD-10-CM | POA: Insufficient documentation

## 2023-03-28 DIAGNOSIS — Z3A28 28 weeks gestation of pregnancy: Secondary | ICD-10-CM

## 2023-03-28 DIAGNOSIS — O365990 Maternal care for other known or suspected poor fetal growth, unspecified trimester, not applicable or unspecified: Secondary | ICD-10-CM | POA: Diagnosis present

## 2023-03-28 DIAGNOSIS — O3412 Maternal care for benign tumor of corpus uteri, second trimester: Secondary | ICD-10-CM | POA: Insufficient documentation

## 2023-03-28 DIAGNOSIS — O99212 Obesity complicating pregnancy, second trimester: Secondary | ICD-10-CM | POA: Diagnosis present

## 2023-03-28 DIAGNOSIS — O3413 Maternal care for benign tumor of corpus uteri, third trimester: Secondary | ICD-10-CM

## 2023-03-28 DIAGNOSIS — E119 Type 2 diabetes mellitus without complications: Secondary | ICD-10-CM | POA: Diagnosis not present

## 2023-03-28 DIAGNOSIS — E669 Obesity, unspecified: Secondary | ICD-10-CM | POA: Diagnosis not present

## 2023-03-28 DIAGNOSIS — D259 Leiomyoma of uterus, unspecified: Secondary | ICD-10-CM | POA: Insufficient documentation

## 2023-03-28 DIAGNOSIS — O10912 Unspecified pre-existing hypertension complicating pregnancy, second trimester: Secondary | ICD-10-CM | POA: Diagnosis present

## 2023-03-28 DIAGNOSIS — O98213 Gonorrhea complicating pregnancy, third trimester: Secondary | ICD-10-CM | POA: Diagnosis present

## 2023-03-28 DIAGNOSIS — O0990 Supervision of high risk pregnancy, unspecified, unspecified trimester: Secondary | ICD-10-CM | POA: Diagnosis present

## 2023-03-28 DIAGNOSIS — O09522 Supervision of elderly multigravida, second trimester: Secondary | ICD-10-CM | POA: Insufficient documentation

## 2023-03-28 DIAGNOSIS — O10013 Pre-existing essential hypertension complicating pregnancy, third trimester: Secondary | ICD-10-CM | POA: Diagnosis not present

## 2023-03-28 DIAGNOSIS — O24113 Pre-existing diabetes mellitus, type 2, in pregnancy, third trimester: Secondary | ICD-10-CM | POA: Diagnosis not present

## 2023-03-28 DIAGNOSIS — O09523 Supervision of elderly multigravida, third trimester: Secondary | ICD-10-CM

## 2023-03-28 DIAGNOSIS — O365920 Maternal care for other known or suspected poor fetal growth, second trimester, not applicable or unspecified: Secondary | ICD-10-CM | POA: Insufficient documentation

## 2023-03-28 NOTE — Procedures (Signed)
Monique Schmidt August 09, 1986 [redacted]w[redacted]d  Fetus A Non-Stress Test Interpretation for 03/28/23  NST only  Indication: IUGR  Fetal Heart Rate A Mode: External Baseline Rate (A): 140 bpm Variability: Minimal, Moderate Accelerations: 10 x 10 Decelerations: None Multiple birth?: No  Uterine Activity Mode: Palpation, Toco Contraction Frequency (min): none Resting Tone Palpated: Relaxed  Interpretation (Fetal Testing) Nonstress Test Interpretation: Reactive Overall Impression: Reassuring for gestational age Comments: Dr. Grace Bushy reviewed tracing

## 2023-03-28 NOTE — Patient Instructions (Signed)
Give boluses 10-15 minutes before meals and all snacks Do correction boluses whenever blood sugars get over 180.  Remember, the total amount that will be needed to correct the high reading may be 0, which means not extra insulin will be needed at this time.   Call the help line if questions with the PDM or with how to use the PDM.   REad over starter booklet and follow directions for next pod change.

## 2023-04-01 ENCOUNTER — Telehealth: Payer: Self-pay | Admitting: Nutrition

## 2023-04-01 ENCOUNTER — Other Ambulatory Visit: Payer: Self-pay

## 2023-04-01 NOTE — Telephone Encounter (Signed)
LVM to call me to let me know how she is doing on this pump.

## 2023-04-03 ENCOUNTER — Other Ambulatory Visit: Payer: Self-pay

## 2023-04-04 ENCOUNTER — Ambulatory Visit: Payer: Medicaid Other | Admitting: *Deleted

## 2023-04-04 ENCOUNTER — Ambulatory Visit (HOSPITAL_BASED_OUTPATIENT_CLINIC_OR_DEPARTMENT_OTHER): Payer: Medicaid Other

## 2023-04-04 ENCOUNTER — Other Ambulatory Visit: Payer: Self-pay | Admitting: *Deleted

## 2023-04-04 ENCOUNTER — Ambulatory Visit: Payer: Medicaid Other | Attending: Obstetrics and Gynecology | Admitting: *Deleted

## 2023-04-04 VITALS — BP 121/76 | HR 95

## 2023-04-04 DIAGNOSIS — O09523 Supervision of elderly multigravida, third trimester: Secondary | ICD-10-CM

## 2023-04-04 DIAGNOSIS — Z3A29 29 weeks gestation of pregnancy: Secondary | ICD-10-CM

## 2023-04-04 DIAGNOSIS — O365930 Maternal care for other known or suspected poor fetal growth, third trimester, not applicable or unspecified: Secondary | ICD-10-CM

## 2023-04-04 DIAGNOSIS — O99013 Anemia complicating pregnancy, third trimester: Secondary | ICD-10-CM | POA: Diagnosis not present

## 2023-04-04 DIAGNOSIS — O10013 Pre-existing essential hypertension complicating pregnancy, third trimester: Secondary | ICD-10-CM | POA: Diagnosis not present

## 2023-04-04 DIAGNOSIS — O24113 Pre-existing diabetes mellitus, type 2, in pregnancy, third trimester: Secondary | ICD-10-CM | POA: Diagnosis not present

## 2023-04-04 DIAGNOSIS — O0943 Supervision of pregnancy with grand multiparity, third trimester: Secondary | ICD-10-CM | POA: Insufficient documentation

## 2023-04-04 DIAGNOSIS — O10913 Unspecified pre-existing hypertension complicating pregnancy, third trimester: Secondary | ICD-10-CM

## 2023-04-04 DIAGNOSIS — E669 Obesity, unspecified: Secondary | ICD-10-CM | POA: Diagnosis not present

## 2023-04-04 DIAGNOSIS — O99213 Obesity complicating pregnancy, third trimester: Secondary | ICD-10-CM | POA: Insufficient documentation

## 2023-04-04 DIAGNOSIS — Z148 Genetic carrier of other disease: Secondary | ICD-10-CM | POA: Insufficient documentation

## 2023-04-04 DIAGNOSIS — Z362 Encounter for other antenatal screening follow-up: Secondary | ICD-10-CM | POA: Diagnosis not present

## 2023-04-04 DIAGNOSIS — O0990 Supervision of high risk pregnancy, unspecified, unspecified trimester: Secondary | ICD-10-CM

## 2023-04-04 DIAGNOSIS — O365920 Maternal care for other known or suspected poor fetal growth, second trimester, not applicable or unspecified: Secondary | ICD-10-CM

## 2023-04-04 DIAGNOSIS — O24112 Pre-existing diabetes mellitus, type 2, in pregnancy, second trimester: Secondary | ICD-10-CM

## 2023-04-04 DIAGNOSIS — O365939 Maternal care for other known or suspected poor fetal growth, third trimester, other fetus: Secondary | ICD-10-CM

## 2023-04-04 DIAGNOSIS — D563 Thalassemia minor: Secondary | ICD-10-CM | POA: Diagnosis not present

## 2023-04-04 DIAGNOSIS — E119 Type 2 diabetes mellitus without complications: Secondary | ICD-10-CM | POA: Diagnosis not present

## 2023-04-04 DIAGNOSIS — O3413 Maternal care for benign tumor of corpus uteri, third trimester: Secondary | ICD-10-CM | POA: Diagnosis not present

## 2023-04-04 DIAGNOSIS — E1169 Type 2 diabetes mellitus with other specified complication: Secondary | ICD-10-CM

## 2023-04-04 DIAGNOSIS — O99212 Obesity complicating pregnancy, second trimester: Secondary | ICD-10-CM

## 2023-04-04 DIAGNOSIS — O285 Abnormal chromosomal and genetic finding on antenatal screening of mother: Secondary | ICD-10-CM | POA: Insufficient documentation

## 2023-04-04 DIAGNOSIS — D259 Leiomyoma of uterus, unspecified: Secondary | ICD-10-CM | POA: Insufficient documentation

## 2023-04-04 DIAGNOSIS — O10912 Unspecified pre-existing hypertension complicating pregnancy, second trimester: Secondary | ICD-10-CM

## 2023-04-04 DIAGNOSIS — O98213 Gonorrhea complicating pregnancy, third trimester: Secondary | ICD-10-CM

## 2023-04-04 DIAGNOSIS — O09522 Supervision of elderly multigravida, second trimester: Secondary | ICD-10-CM

## 2023-04-04 NOTE — Procedures (Signed)
Monique Schmidt [birth year] [redacted]w[redacted]d  Fetus A Non-Stress Test Interpretation for 04/04/23-NST with UAD & limited  Indication: Chronic Hypertenstion, Diabetes, Advanced Maternal Age >40 years, and MOrbidly obese & Alpha Thal [name]  Fetal Heart Rate A Mode: External Baseline Rate (A): 140 bpm Variability: Moderate Accelerations: 10 x 10 Decelerations: None Multiple birth?: No  Uterine Activity Mode: Toco Contraction Frequency (min): none Resting Tone Palpated: Relaxed  Interpretation (Fetal Testing) Nonstress Test Interpretation: Reactive Comments: Tracing reviewed byDr.Schaible

## 2023-04-06 ENCOUNTER — Encounter (HOSPITAL_COMMUNITY): Payer: Self-pay | Admitting: Obstetrics and Gynecology

## 2023-04-06 ENCOUNTER — Inpatient Hospital Stay (HOSPITAL_COMMUNITY)
Admission: AD | Admit: 2023-04-06 | Discharge: 2023-04-06 | Disposition: A | Discharge status: Home / Self Care | Payer: Medicaid Other | Attending: Obstetrics and Gynecology | Admitting: Obstetrics and Gynecology

## 2023-04-06 DIAGNOSIS — Z3A29 29 weeks gestation of pregnancy: Secondary | ICD-10-CM | POA: Diagnosis not present

## 2023-04-06 DIAGNOSIS — Z9641 Presence of insulin pump (external) (internal): Secondary | ICD-10-CM | POA: Diagnosis not present

## 2023-04-06 DIAGNOSIS — A549 Gonococcal infection, unspecified: Secondary | ICD-10-CM | POA: Insufficient documentation

## 2023-04-06 DIAGNOSIS — E1165 Type 2 diabetes mellitus with hyperglycemia: Secondary | ICD-10-CM | POA: Diagnosis present

## 2023-04-06 DIAGNOSIS — O98213 Gonorrhea complicating pregnancy, third trimester: Secondary | ICD-10-CM | POA: Diagnosis not present

## 2023-04-06 DIAGNOSIS — O99810 Abnormal glucose complicating pregnancy: Secondary | ICD-10-CM | POA: Diagnosis not present

## 2023-04-06 DIAGNOSIS — O24119 Pre-existing diabetes mellitus, type 2, in pregnancy, unspecified trimester: Secondary | ICD-10-CM

## 2023-04-06 DIAGNOSIS — O24113 Pre-existing diabetes mellitus, type 2, in pregnancy, third trimester: Secondary | ICD-10-CM | POA: Diagnosis not present

## 2023-04-06 DIAGNOSIS — A5901 Trichomonal vulvovaginitis: Secondary | ICD-10-CM

## 2023-04-06 DIAGNOSIS — Z794 Long term (current) use of insulin: Secondary | ICD-10-CM | POA: Diagnosis not present

## 2023-04-06 DIAGNOSIS — O0990 Supervision of high risk pregnancy, unspecified, unspecified trimester: Secondary | ICD-10-CM

## 2023-04-06 LAB — CBC
HCT: 38.4 % (ref 36.0–46.0)
Hemoglobin: 12.8 g/dL (ref 12.0–15.0)
MCH: 27 pg (ref 26.0–34.0)
MCHC: 33.3 g/dL (ref 30.0–36.0)
MCV: 81 fL (ref 80.0–100.0)
Platelets: 121 10*3/uL — ABNORMAL LOW (ref 150–400)
RBC: 4.74 MIL/uL (ref 3.87–5.11)
RDW: 14.6 % (ref 11.5–15.5)
WBC: 8.9 10*3/uL (ref 4.0–10.5)
nRBC: 0 % (ref 0.0–0.2)

## 2023-04-06 LAB — COMPREHENSIVE METABOLIC PANEL
ALT: 27 U/L (ref 0–44)
AST: 29 U/L (ref 15–41)
Albumin: 2.6 g/dL — ABNORMAL LOW (ref 3.5–5.0)
Alkaline Phosphatase: 79 U/L (ref 38–126)
Anion gap: 10 (ref 5–15)
BUN: 6 mg/dL (ref 6–20)
CO2: 22 mmol/L (ref 22–32)
Calcium: 8.7 mg/dL — ABNORMAL LOW (ref 8.9–10.3)
Chloride: 101 mmol/L (ref 98–111)
Creatinine, Ser: 0.71 mg/dL (ref 0.44–1.00)
GFR, Estimated: >60 mL/min (ref >60)
Glucose, Bld: 182 mg/dL — ABNORMAL HIGH (ref 70–99)
Potassium: 3.1 mmol/L — ABNORMAL LOW (ref 3.5–5.1)
Sodium: 133 mmol/L — ABNORMAL LOW (ref 135–145)
Total Bilirubin: 0.2 mg/dL — ABNORMAL LOW (ref 0.3–1.2)
Total Protein: 6.8 g/dL (ref 6.5–8.1)

## 2023-04-06 LAB — URINALYSIS, ROUTINE W REFLEX MICROSCOPIC
Bilirubin Urine: NEGATIVE
Glucose, UA: >=500 mg/dL — AB
Hgb urine dipstick: NEGATIVE
Ketones, ur: 5 mg/dL — AB
Nitrite: NEGATIVE
Protein, ur: 30 mg/dL — AB
RBC / HPF: >50 RBC/hpf (ref 0–5)
Specific Gravity, Urine: 1.031 — ABNORMAL HIGH (ref 1.005–1.030)
WBC, UA: >50 WBC/hpf (ref 0–5)
pH: 5 (ref 5.0–8.0)

## 2023-04-06 MED ORDER — METRONIDAZOLE 500 MG PO TABS: ORAL_TABLET | ORAL | 0 refills | Status: DC

## 2023-04-06 MED ORDER — ALBUTEROL SULFATE (2.5 MG/3ML) 0.083% IN NEBU
2.5000 mg | INHALATION_SOLUTION | Freq: Once | RESPIRATORY_TRACT | Status: AC
  Administered 2023-04-06: 2.5 mg via RESPIRATORY_TRACT
  Filled 2023-04-06: qty 3

## 2023-04-06 MED ORDER — ACETAMINOPHEN 500 MG PO TABS
1000.0000 mg | ORAL_TABLET | Freq: Once | ORAL | Status: AC
  Administered 2023-04-06: 1000 mg via ORAL
  Filled 2023-04-06: qty 2

## 2023-04-06 MED ORDER — SODIUM CHLORIDE 0.9 % IV BOLUS
1000.0000 mL | Freq: Once | INTRAVENOUS | Status: AC
  Administered 2023-04-06: 1000 mL via INTRAVENOUS

## 2023-04-06 MED ORDER — FLUCONAZOLE 150 MG PO TABS
ORAL_TABLET | ORAL | 0 refills | Status: DC
Start: 2023-04-06 — End: 2023-04-25

## 2023-04-06 NOTE — MAU Note (Signed)
Pt says BS- 303 at 0040 Has A meter and Omnipod- to deliver insulin  Now in Triage- meter- told pt - BS was 275.  Now 268.  Says always happens like this . Dr Shawnie Pons manages her.  [facility]- clinic  Feels some UC's . Back hurts- started Thursday - no meds . 8-9/10.- makes her feel nauseated

## 2023-04-06 NOTE — MAU Provider Note (Signed)
Chief Complaint:  Back Pain   Event Date/Time   First Provider Initiated Contact with Patient 04/06/23 0137      HPI: Monique Schmidt is a 37 y.o. [id number] at [redacted]w[redacted]d by early ultrasound who presents to maternity admissions reporting glucose values of 300s at home today. She has an Omnipod insulin pump and Dexcom continuous glucose monitor and when she entered her glucose values, the Omnipod administered insulin. After 15-20 minutes, her glucose remained around 300 so she came in. She also reports SOB that is improved with albuterol inhaler, but reports she hasn't used her inhaler since this morning.  She reports good fetal movement, denies regular contractions or other OB symptoms.   HPI  Past Medical History: Past Medical History:  Diagnosis Date   Abnormal maternal serum screening test 02/26/2023   HR due to low FF     Abnormal Pap smear    Bacterial vaginosis    Gestational diabetes    likely pre-exisiting; all pregnancies glyburide started 11/4   Gestational thrombocytopenia (HCC)    Headache(784.0)    Hernia, umbilical    History of abnormal cervical Pap smear    normal 2014   History of gestational diabetes 06/26/2019   Mild preeclampsia delivered 01/22/2016   Moderate persistent asthma 08/07/2013   Obesity complicating pregnancy 09/21/2013   Type 2 diabetes mellitus (HCC)     Past obstetric history: OB History  Gravida Para Term Preterm AB Living  7 5 5   1 5   SAB IAB Ectopic Multiple Live Births  1 0 0 0 5    # Outcome Date GA Lbr Len/2nd Weight Sex Type Anes PTL Lv  7 Current           6 Term 01/20/16 [redacted]w[redacted]d 00:55 / 00:08 4414 g F Vag-Spont None  LIV     Birth Comments: none  5 Term 12/15/13 [redacted]w[redacted]d 06:43 / 00:02 4020 g F Vag-Spont None  LIV  4 Term 06/26/12 [redacted]w[redacted]d 21:07 / 00:01 3232 g F Vag-Spont None  LIV  3 Term 03/08/11 [redacted]w[redacted]d 01:27 / 00:02 3884 g F Vag-Spont None  LIV     Birth Comments: none  2 Term 05/19/09 [redacted]w[redacted]d   F Vag-Spont None N LIV  1 SAB              Past Surgical History: Past Surgical History:  Procedure Laterality Date   UMBILICAL HERNIA REPAIR N/A 11/22/2014   Procedure: LAPAROSCOPIC UMBILICAL HERNIA;  Surgeon: Axel Filler, MD;  Location: [facility] OR;  Service: General;  Laterality: N/A;   WISDOM TOOTH EXTRACTION      Family History: Family History  Problem Relation Age of Onset   Hypertension Father    Diabetes Father    Diabetes Mother    Seizures Brother     Social History: Social History   Tobacco Use   Smoking status: Never   Smokeless tobacco: Never  Vaping Use   Vaping status: Never Used  Substance Use Topics   Alcohol use: No   Drug use: No    Allergies:  Allergies  Allergen Reactions   Latex Itching and Rash   Penicillins Itching and Rash    Did it involve swelling of the face/tongue/throat, SOB, or low BP? N Did it involve sudden or severe rash/hives, skin peeling, or any reaction on the inside of your mouth or nose? Y Did you need to seek medical attention at a hospital or doctor's office? No When did it last happen?  Several  Years Ago    If all above answers are "NO", may proceed with cephalosporin use.        Meds:  Medications Prior to Admission  Medication Sig Dispense Refill Last Dose   aspirin EC 81 MG tablet Take 2 tablets (162 mg total) by mouth daily. Start taking when you are [redacted] weeks pregnant for rest of pregnancy for prevention of preeclampsia 300 tablet 2 04/05/2023   Insulin Disposable Pump (OMNIPOD DASH PODS, GEN 4,) MISC Use as directed every other day. 15 each 5 04/06/2023   metFORMIN (GLUCOPHAGE-XR) 500 MG 24 hr tablet Take 1 tablet (500 mg total) by mouth 2 (two) times daily with a meal. 60 tablet 2 04/05/2023   NIFEdipine (PROCARDIA XL) 30 MG 24 hr tablet Take 1 tablet (30 mg total) by mouth daily. 30 tablet 6 04/05/2023   Prenatal Vit-Fe Phos-FA-Omega (VITAFOL GUMMIES) 3.33-0.333-34.8 MG CHEW Chew 3 tablets by mouth daily. 90 tablet 11 04/05/2023   terconazole (TERAZOL 3) 0.8  % vaginal cream Place 1 applicator vaginally at bedtime. 20 g 0 Past Week   Accu-Chek Softclix Lancets lancets Use four times per day as instructed 100 each 12    acetaminophen (TYLENOL) 500 MG tablet Take 2 tablets (1,000 mg total) by mouth every 6 (six) hours as needed for fever, headache or mild pain. 30 tablet 0    albuterol (VENTOLIN HFA) 108 (90 Base) MCG/ACT inhaler Inhale 1-2 puffs into the lungs every 6 (six) hours as needed for wheezing or shortness of breath. 18 g 1    BD PEN NEEDLE NANO 2ND GEN 32G X 4 MM MISC Inject into the skin 2 (two) times daily.      blood glucose meter kit and supplies KIT Dispense based on patient and insurance preference. Use up to four times daily as directed. 1 each 0    cetirizine (ZYRTEC ALLERGY) 10 MG tablet Take 1 tablet (10 mg total) by mouth daily. 30 tablet 5    Continuous Glucose Sensor (DEXCOM G7 SENSOR) MISC Replace sensor every 10 days 3 each 5    cyclobenzaprine (FLEXERIL) 10 MG tablet Take 1 tablet (10 mg total) by mouth every 8 (eight) hours as needed for muscle spasms. 30 tablet 0    fluticasone (FLOVENT HFA) 44 MCG/ACT inhaler Inhale 2 puffs into the lungs 2 (two) times daily. 10.6 g 1    glucose blood (ACCU-CHEK GUIDE) test strip Use four times per day as instructed 100 each 12    insulin aspart (NOVOLOG) 100 UNIT/ML injection For use in Omnipod. Current TDD is 68 units daily. 30 mL 6    Insulin Syringe-Needle U-100 (INSULIN SYRINGE 1CC/31GX5/16") 31G X 5/16" 1 ML MISC Use 2 (two) times daily. 100 each 6     ROS:  Review of Systems  Constitutional:  Negative for chills, fatigue and fever.  Eyes:  Negative for visual disturbance.  Respiratory:  Positive for shortness of breath.   Cardiovascular:  Negative for chest pain.  Gastrointestinal:  Negative for abdominal pain, nausea and vomiting.  Genitourinary:  Negative for difficulty urinating, dysuria, flank pain, pelvic pain, vaginal bleeding, vaginal discharge and vaginal pain.   Neurological:  Negative for dizziness and headaches.  Psychiatric/Behavioral: Negative.       I have reviewed patient's Past Medical Hx, Surgical Hx, Family Hx, Social Hx, medications and allergies.   Physical Exam  Patient Vitals for the past 24 hrs:  BP Temp Pulse Resp SpO2 Height Weight  04/06/23 0401 -- -- -- --  100 % -- --  04/06/23 0114 113/76 -- 92 -- 99 % -- --  04/06/23 0052 127/82 98.4 F (36.9 C) 97 16 99 % 5\' 6"  (1.676 m) (!) 139.8 kg   Constitutional: Well-developed, well-nourished female in no acute distress.  HEART: RRR, no murmurs rubs/gallops RESP: clear and equal to auscultation bilaterally in all lobes  GI: Abd soft, non-tender, gravid appropriate for gestational age.  MS: Extremities nontender, no edema, normal ROM Neurologic: Alert and oriented x 4.  GU: Neg CVAT.  PELVIC EXAM: Deferred     FHT:  Baseline 135 , moderate variability, accelerations present (10 x 10), no decelerations Contractions: none on toco or to palpation   Labs: Results for orders placed or performed during the hospital encounter of 04/06/23 (from the past 24 hour(s))  Urinalysis, Routine w reflex microscopic -Urine, Clean Catch     Status: Abnormal   Collection Time: 04/06/23  1:09 AM  Result Value Ref Range   Color, Urine YELLOW YELLOW   APPearance CLOUDY (A) CLEAR   Specific Gravity, Urine 1.031 (H) 1.005 - 1.030   pH 5.0 5.0 - 8.0   Glucose, UA >=500 (A) NEGATIVE mg/dL   Hgb urine dipstick NEGATIVE NEGATIVE   Bilirubin Urine NEGATIVE NEGATIVE   Ketones, ur 5 (A) NEGATIVE mg/dL   Protein, ur 30 (A) NEGATIVE mg/dL   Nitrite NEGATIVE NEGATIVE   Leukocytes,Ua LARGE (A) NEGATIVE   RBC / HPF >50 0 - 5 RBC/hpf   WBC, UA >50 0 - 5 WBC/hpf   Bacteria, UA FEW (A) NONE SEEN   Squamous Epithelial / HPF 6-10 0 - 5 /HPF   Mucus PRESENT    Trichomonas, UA PRESENT (A) NONE SEEN  CBC     Status: Abnormal   Collection Time: 04/06/23  2:30 AM  Result Value Ref Range   WBC 8.9 4.0 -  10.5 K/uL   RBC 4.74 3.87 - 5.11 MIL/uL   Hemoglobin 12.8 12.0 - 15.0 g/dL   HCT 01.0 27.2 - 53.6 %   MCV 81.0 80.0 - 100.0 fL   MCH 27.0 26.0 - 34.0 pg   MCHC 33.3 30.0 - 36.0 g/dL   RDW 64.4 03.4 - 74.2 %   Platelets 121 (L) 150 - 400 K/uL   nRBC 0.0 0.0 - 0.2 %  Comprehensive metabolic panel     Status: Abnormal   Collection Time: 04/06/23  2:30 AM  Result Value Ref Range   Sodium 133 (L) 135 - 145 mmol/L   Potassium 3.1 (L) 3.5 - 5.1 mmol/L   Chloride 101 98 - 111 mmol/L   CO2 22 22 - 32 mmol/L   Glucose, Bld 182 (H) 70 - 99 mg/dL   BUN 6 6 - 20 mg/dL   Creatinine, Ser 5.95 0.44 - 1.00 mg/dL   Calcium 8.7 (L) 8.9 - 10.3 mg/dL   Total Protein 6.8 6.5 - 8.1 g/dL   Albumin 2.6 (L) 3.5 - 5.0 g/dL   AST 29 15 - 41 U/L   ALT 27 0 - 44 U/L   Alkaline Phosphatase 79 38 - 126 U/L   Total Bilirubin 0.2 (L) 0.3 - 1.2 mg/dL   GFR, Estimated >63 >87 mL/min   Anion gap 10 5 - 15   A/Positive/-- (04/01 1634)  Imaging:   MAU Course/MDM: Orders Placed This Encounter  Procedures   Urinalysis, Routine w reflex microscopic -Urine, Clean Catch   CBC   Comprehensive metabolic panel   Discharge patient    Meds  ordered this encounter  Medications   sodium chloride 0.9 % bolus 1,000 mL   sodium chloride 0.9 % bolus 1,000 mL   acetaminophen (TYLENOL) tablet 1,000 mg   albuterol (PROVENTIL) (2.5 MG/3ML) 0.083% nebulizer solution 2.5 mg     NST reviewed and appropriate for gestational age IV fluids given, labs ordered No evidence of DKA with normal anion gap and glucose values down to 180s with Omnipod delivered insulin and IV fluids. No additional insulin given in MAU. Nebulizer tx given in MAU and pt reports improved SOB Trichomonas noted on UA today, discussed with pt Rx for Flagyl (single dose per pt preference) and Diflucan due to recurrent yeast infections and abx course Pt declines partner therapy and reports she will not be sexually active with the partner now Consult Dr  Para March with assessment and findings D/C home, continue low carb diet, Omnipod/Dexcom management/tx of glucose values Keep scheduled appt in 1 week Return to MAU for emergencies   Assessment: 1. Supervision of high risk pregnancy, antepartum   2. Gonorrhea affecting pregnancy in third trimester   3. Hyperglycemia during pregnancy   4. Type 2 diabetes mellitus affecting pregnancy, antepartum   5. [redacted] weeks gestation of pregnancy     Plan: Discharge home Labor precautions and fetal kick counts  Follow-up Information     Center for [facility] Follow up.   Specialty: Obstetrics and Gynecology Why: As scheduled Contact information: [address] [place] [address] [phone]        [facility] Follow up.   Specialty: Obstetrics and Gynecology Why: As needed for emergencies Contact information: [address] [place] [address] [phone]               Allergies as of 04/06/2023       Reactions   Latex Itching, Rash   Penicillins Itching, Rash   Did it involve swelling of the face/tongue/throat, SOB, or low BP? N Did it involve sudden or severe rash/hives, skin peeling, or any reaction on the inside of your mouth or nose? Y Did you need to seek medical attention at a hospital or doctor's office? No When did it last happen?  Several  Years Ago    If all above answers are "NO", may proceed with cephalosporin use.        Medication List     TAKE these medications    Accu-Chek Guide test strip Generic drug: glucose blood Use four times per day as instructed   Accu-Chek Softclix Lancets lancets Use four times per day as instructed   acetaminophen 500 MG tablet Commonly known as: TYLENOL Take 2 tablets (1,000 mg total) by mouth every 6 (six) hours as needed for fever, headache or mild pain.   aspirin EC 81 MG tablet Take 2 tablets (162 mg total) by  mouth daily. Start taking when you are [redacted] weeks pregnant for rest of pregnancy for prevention of preeclampsia   BD Insulin Syringe U/F 31G X 5/16" 1 ML Misc Generic drug: Insulin Syringe-Needle U-100 Use 2 (two) times daily.   BD Pen Needle Nano 2nd Gen 32G X 4 MM Misc Generic drug: Insulin Pen Needle Inject into the skin 2 (two) times daily.   blood glucose meter kit and supplies Kit Dispense based on patient and insurance preference. Use up to four times daily as directed.   cetirizine 10 MG tablet Commonly known as: ZyrTEC Allergy Take 1 tablet (  10 mg total) by mouth daily.   cyclobenzaprine 10 MG tablet Commonly known as: FLEXERIL Take 1 tablet (10 mg total) by mouth every 8 (eight) hours as needed for muscle spasms.   Dexcom G7 Sensor Misc Replace sensor every 10 days   fluticasone 44 MCG/ACT inhaler Commonly known as: Flovent HFA Inhale 2 puffs into the lungs 2 (two) times daily.   insulin aspart 100 UNIT/ML injection Commonly known as: novoLOG For use in Omnipod. Current TDD is 68 units daily.   metFORMIN 500 MG 24 hr tablet Commonly known as: GLUCOPHAGE-XR Take 1 tablet (500 mg total) by mouth 2 (two) times daily with a meal.   NIFEdipine 30 MG 24 hr tablet Commonly known as: Procardia XL Take 1 tablet (30 mg total) by mouth daily.   Omnipod DASH Pods (Gen 4) Misc Use as directed every other day.   terconazole 0.8 % vaginal cream Commonly known as: TERAZOL 3 Place 1 applicator vaginally at bedtime.   Ventolin HFA 108 (90 Base) MCG/ACT inhaler Generic drug: albuterol Inhale 1-2 puffs into the lungs every 6 (six) hours as needed for wheezing or shortness of breath.   Vitafol Gummies 3.33-0.333-34.8 MG Chew Chew 3 tablets by mouth daily.        Sharen Counter Certified Nurse-Midwife 04/06/2023 4:19 AM

## 2023-04-08 ENCOUNTER — Other Ambulatory Visit (HOSPITAL_BASED_OUTPATIENT_CLINIC_OR_DEPARTMENT_OTHER): Payer: Self-pay

## 2023-04-08 ENCOUNTER — Other Ambulatory Visit: Payer: Self-pay

## 2023-04-08 ENCOUNTER — Other Ambulatory Visit: Payer: Self-pay | Admitting: Certified Nurse Midwife

## 2023-04-09 ENCOUNTER — Encounter: Payer: Medicaid Other | Admitting: Family Medicine

## 2023-04-11 ENCOUNTER — Other Ambulatory Visit: Payer: Self-pay

## 2023-04-11 ENCOUNTER — Ambulatory Visit (INDEPENDENT_AMBULATORY_CARE_PROVIDER_SITE_OTHER): Payer: Medicaid Other | Admitting: Obstetrics and Gynecology

## 2023-04-11 ENCOUNTER — Other Ambulatory Visit: Payer: Self-pay | Admitting: *Deleted

## 2023-04-11 ENCOUNTER — Ambulatory Visit: Payer: Medicaid Other | Attending: Obstetrics and Gynecology

## 2023-04-11 ENCOUNTER — Ambulatory Visit: Payer: Medicaid Other | Admitting: *Deleted

## 2023-04-11 ENCOUNTER — Telehealth: Payer: Self-pay | Admitting: Dietician

## 2023-04-11 ENCOUNTER — Ambulatory Visit: Payer: Medicaid Other

## 2023-04-11 VITALS — BP 111/79 | HR 87 | Wt 307.5 lb

## 2023-04-11 VITALS — BP 136/86 | HR 101

## 2023-04-11 DIAGNOSIS — O99113 Other diseases of the blood and blood-forming organs and certain disorders involving the immune mechanism complicating pregnancy, third trimester: Secondary | ICD-10-CM

## 2023-04-11 DIAGNOSIS — O10013 Pre-existing essential hypertension complicating pregnancy, third trimester: Secondary | ICD-10-CM | POA: Diagnosis not present

## 2023-04-11 DIAGNOSIS — O0990 Supervision of high risk pregnancy, unspecified, unspecified trimester: Secondary | ICD-10-CM

## 2023-04-11 DIAGNOSIS — Z3A30 30 weeks gestation of pregnancy: Secondary | ICD-10-CM

## 2023-04-11 DIAGNOSIS — O3410 Maternal care for benign tumor of corpus uteri, unspecified trimester: Secondary | ICD-10-CM

## 2023-04-11 DIAGNOSIS — O99213 Obesity complicating pregnancy, third trimester: Secondary | ICD-10-CM

## 2023-04-11 DIAGNOSIS — O09523 Supervision of elderly multigravida, third trimester: Secondary | ICD-10-CM

## 2023-04-11 DIAGNOSIS — O3412 Maternal care for benign tumor of corpus uteri, second trimester: Secondary | ICD-10-CM | POA: Diagnosis present

## 2023-04-11 DIAGNOSIS — O365930 Maternal care for other known or suspected poor fetal growth, third trimester, not applicable or unspecified: Secondary | ICD-10-CM | POA: Insufficient documentation

## 2023-04-11 DIAGNOSIS — E669 Obesity, unspecified: Secondary | ICD-10-CM

## 2023-04-11 DIAGNOSIS — O0943 Supervision of pregnancy with grand multiparity, third trimester: Secondary | ICD-10-CM

## 2023-04-11 DIAGNOSIS — Z6841 Body Mass Index (BMI) 40.0 and over, adult: Secondary | ICD-10-CM | POA: Diagnosis not present

## 2023-04-11 DIAGNOSIS — O24113 Pre-existing diabetes mellitus, type 2, in pregnancy, third trimester: Secondary | ICD-10-CM | POA: Diagnosis not present

## 2023-04-11 DIAGNOSIS — O98213 Gonorrhea complicating pregnancy, third trimester: Secondary | ICD-10-CM

## 2023-04-11 DIAGNOSIS — O365920 Maternal care for other known or suspected poor fetal growth, second trimester, not applicable or unspecified: Secondary | ICD-10-CM | POA: Insufficient documentation

## 2023-04-11 DIAGNOSIS — O10919 Unspecified pre-existing hypertension complicating pregnancy, unspecified trimester: Secondary | ICD-10-CM

## 2023-04-11 DIAGNOSIS — O09522 Supervision of elderly multigravida, second trimester: Secondary | ICD-10-CM | POA: Insufficient documentation

## 2023-04-11 DIAGNOSIS — D696 Thrombocytopenia, unspecified: Secondary | ICD-10-CM | POA: Diagnosis not present

## 2023-04-11 DIAGNOSIS — O24112 Pre-existing diabetes mellitus, type 2, in pregnancy, second trimester: Secondary | ICD-10-CM | POA: Insufficient documentation

## 2023-04-11 DIAGNOSIS — D259 Leiomyoma of uterus, unspecified: Secondary | ICD-10-CM | POA: Insufficient documentation

## 2023-04-11 DIAGNOSIS — E1169 Type 2 diabetes mellitus with other specified complication: Secondary | ICD-10-CM | POA: Diagnosis not present

## 2023-04-11 DIAGNOSIS — O10912 Unspecified pre-existing hypertension complicating pregnancy, second trimester: Secondary | ICD-10-CM | POA: Insufficient documentation

## 2023-04-11 DIAGNOSIS — O10913 Unspecified pre-existing hypertension complicating pregnancy, third trimester: Secondary | ICD-10-CM

## 2023-04-11 DIAGNOSIS — O285 Abnormal chromosomal and genetic finding on antenatal screening of mother: Secondary | ICD-10-CM

## 2023-04-11 DIAGNOSIS — O365990 Maternal care for other known or suspected poor fetal growth, unspecified trimester, not applicable or unspecified: Secondary | ICD-10-CM

## 2023-04-11 DIAGNOSIS — O99212 Obesity complicating pregnancy, second trimester: Secondary | ICD-10-CM | POA: Insufficient documentation

## 2023-04-11 DIAGNOSIS — D563 Thalassemia minor: Secondary | ICD-10-CM

## 2023-04-11 DIAGNOSIS — O0993 Supervision of high risk pregnancy, unspecified, third trimester: Secondary | ICD-10-CM

## 2023-04-11 DIAGNOSIS — A5901 Trichomonal vulvovaginitis: Secondary | ICD-10-CM

## 2023-04-11 DIAGNOSIS — O99013 Anemia complicating pregnancy, third trimester: Secondary | ICD-10-CM | POA: Diagnosis not present

## 2023-04-11 DIAGNOSIS — O24119 Pre-existing diabetes mellitus, type 2, in pregnancy, unspecified trimester: Secondary | ICD-10-CM

## 2023-04-11 DIAGNOSIS — O09529 Supervision of elderly multigravida, unspecified trimester: Secondary | ICD-10-CM

## 2023-04-11 LAB — GLUCOSE, CAPILLARY: Glucose-Capillary: 148 mg/dL — ABNORMAL HIGH (ref 70–99)

## 2023-04-11 NOTE — Procedures (Signed)
Monique Schmidt May 27, 1986 [redacted]w[redacted]d  Fetus A Non-Stress Test Interpretation for 04/11/23  Indication: IUGR, Chronic Hypertenstion, Diabetes, and Advanced Maternal Age >40 years  Fetal Heart Rate A Mode: External Baseline Rate (A): 140 bpm Variability: Moderate Accelerations: 10 x 10 Decelerations: None Multiple birth?: No  Uterine Activity Mode: Toco Contraction Frequency (min): none Resting Tone Palpated: Relaxed  Interpretation (Fetal Testing) Nonstress Test Interpretation: Non-reactive (unable to finish NST) Comments: Tracing reviewed by Dr. Parke Poisson

## 2023-04-11 NOTE — Progress Notes (Signed)
PRENATAL VISIT NOTE  Subjective:  Monique Schmidt is a 37 y.o. [id number] at [redacted]w[redacted]d being seen today for ongoing prenatal care.  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  Patient reports no complaints.  Contractions: Not present. Vag. Bleeding: None.  Movement: Present. Denies leaking of fluid.   The following portions of the patient's history were reviewed and updated as appropriate: allergies, current medications, past family history, past medical history, past social history, past surgical history and problem list.   Objective:   Vitals:   04/11/23 1559  BP: 111/79  Pulse: 87  Weight: (!) 307 lb 8 oz (139.5 kg)    Fetal Status: Fetal Heart Rate (bpm): 141   Movement: Present     General:  Alert, oriented and cooperative. Patient is in no acute distress.  Skin: Skin is warm and dry. No rash noted.   Cardiovascular: Normal heart rate noted  Respiratory: Normal respiratory effort, no problems with respiration noted  Abdomen: Soft, gravid, appropriate for gestational age.  Pain/Pressure: Absent     Pelvic: Cervical exam deferred        Extremities: Normal range of motion.  Edema: None  Mental Status: Normal mood and affect. Normal behavior. Normal judgment and thought content.   Assessment and Plan:  Pregnancy: [id number] at [redacted]w[redacted]d 1. AMA (advanced maternal age) multigravida 35+, unspecified trimester HR T13/18 on panorma.  AFP negative.  Seen  by [facility] on 11/2022 and declined any other testing  2. Abnormal genetic test during pregnancy See above  3. Supervision of high risk pregnancy, antepartum Follow up re: birth control next visit Continue low dose ASA  4. [redacted] weeks gestation of pregnancy  5. Fetal growth restriction antepartum Resolved 8/22: 21%, 1426gm, ac 88% but with FL/BPD/HC all <1%, afi 18, UA dopplers wnl, bpp 8/8 MFM to continue with weekly monitoring  6. Chronic hypertension affecting pregnancy Continue procardia 30 qday  7. BMI 50.0-59.9, adult (HCC) Weight stable  8. Obesity in pregnancy, antepartum, third trimester  9. Trichomonas affecting pregnancy in third trimester Treated 8/17 in mau Test of cure mid to late sept  10. Type 2 diabetes mellitus affecting pregnancy, antepartum  insulin, oral New OB A1c 12.7. Patient has dexcom and omnipod. Patient sometimes that dexcom sometimes gives odd readings with recent 8/17 MAU visit for readings in the 300s that was brought down with just the omnipod and no additional insulin. CBG 148 in the clinic and showing 180s on the dexcom. A1c ordered for today. If still poorly controlled next week, then may need inpatient admission Message sent to Oran Rein and Cristy Folks re: helping patient with omnipod settings and may need to do SQ with meals  Pt to start qwk testing on 8/30 with MFM  11. Uterine fibroid in pregnancy Not seen on 8/22  12. Benign gestational thrombocytopenia in third trimester [facility]) Repeat q2wks for now     Latest Ref Rng & Units 04/06/2023    2:30 AM 03/27/2023   10:23 AM  CBC  WBC 3.4 - 10.8 x10E3/uL 8.9  8.7   Hemoglobin 11.1 -  15.9 g/dL 82.9  56.2   Hematocrit 34.0 - 46.6 % 38.4  37.6   Platelets 150 - 450 x10E3/uL 121  111   - Hemoglobin A1c - Comprehensive metabolic panel - CBC  Preterm labor symptoms and general obstetric precautions including but not limited to vaginal bleeding, contractions, leaking of fluid and fetal movement  were reviewed in detail with the patient. Please refer to After Visit Summary for other counseling recommendations.   Return in 8 days (on 04/19/2023) for in person, high risk ob, md visit.  Future Appointments  Date Time Provider Department Center  04/19/2023 10:30 AM [facility] NURSE [facility]  04/19/2023 10:45 AM [facility] NST [facility]  04/19/2023 11:30 AM [facility] US2 [facility]  04/25/2023  2:15 PM [place] Bing, MD [facility]  04/25/2023  3:30 PM [facility] NURSE [facility]  04/25/2023  3:45 PM [facility] US4 [facility]  05/02/2023  9:15 AM [facility] NURSE [facility]  05/02/2023  9:30 AM [facility] US3 [facility]  05/09/2023 10:15 AM Reva Bores, MD [facility]  05/20/2023 10:15 AM Adam Phenix, MD [facility]    [place] Bing, MD

## 2023-04-11 NOTE — Telephone Encounter (Signed)
Message received from Dr. Vergie Living as patient's insulin pump settings need to be adjusted to obtain improved blood glucose control.  Called patient who was not available.  Left message for her to return my call.  She has been very difficult to reach via phone as well post pump training.  Will leave her a my chart message.  Oran Rein, RD, LDN, CDCES

## 2023-04-11 NOTE — Progress Notes (Signed)
21%, 1426g, ac 88%, bpp 8/8, afi 18.5, UA wnl  [ ]  delivery at 37w [ ]  continue weekly testing  7/1 fetal echo wnl

## 2023-04-12 LAB — COMPREHENSIVE METABOLIC PANEL
ALT: 24 IU/L (ref 0–32)
AST: 21 IU/L (ref 0–40)
Albumin: 3.6 g/dL — ABNORMAL LOW (ref 3.9–4.9)
Alkaline Phosphatase: 94 IU/L (ref 44–121)
BUN/Creatinine Ratio: 15 (ref 9–23)
BUN: 7 mg/dL (ref 6–20)
Bilirubin Total: <0.2 mg/dL (ref 0.0–1.2)
CO2: 21 mmol/L (ref 20–29)
Calcium: 9 mg/dL (ref 8.7–10.2)
Chloride: 99 mmol/L (ref 96–106)
Creatinine, Ser: 0.47 mg/dL — ABNORMAL LOW (ref 0.57–1.00)
Globulin, Total: 3 g/dL (ref 1.5–4.5)
Glucose: 138 mg/dL — ABNORMAL HIGH (ref 70–99)
Potassium: 3.6 mmol/L (ref 3.5–5.2)
Sodium: 136 mmol/L (ref 134–144)
Total Protein: 6.6 g/dL (ref 6.0–8.5)
eGFR: 124 mL/min/{1.73_m2} (ref >59)

## 2023-04-12 LAB — CBC
Hematocrit: 43 % (ref 34.0–46.6)
Hemoglobin: 13.9 g/dL (ref 11.1–15.9)
MCH: 26.4 pg — ABNORMAL LOW (ref 26.6–33.0)
MCHC: 32.3 g/dL (ref 31.5–35.7)
MCV: 82 fL (ref 79–97)
Platelets: 133 10*3/uL — ABNORMAL LOW (ref 150–450)
RBC: 5.26 x10E6/uL (ref 3.77–5.28)
RDW: 14.7 % (ref 11.7–15.4)
WBC: 8.8 10*3/uL (ref 3.4–10.8)

## 2023-04-12 LAB — HEMOGLOBIN A1C
Est. average glucose Bld gHb Est-mCnc: 226 mg/dL
Hgb A1c MFr Bld: 9.5 % — ABNORMAL HIGH (ref 4.8–5.6)

## 2023-04-14 DIAGNOSIS — A5901 Trichomonal vulvovaginitis: Secondary | ICD-10-CM | POA: Insufficient documentation

## 2023-04-15 ENCOUNTER — Other Ambulatory Visit: Payer: Self-pay

## 2023-04-15 ENCOUNTER — Other Ambulatory Visit (HOSPITAL_BASED_OUTPATIENT_CLINIC_OR_DEPARTMENT_OTHER): Payer: Self-pay

## 2023-04-15 MED ORDER — ACETAMINOPHEN 500 MG PO TABS
1000.0000 mg | ORAL_TABLET | Freq: Four times a day (QID) | ORAL | 0 refills | Status: DC | PRN
  Filled 2023-04-15: qty 30, 4d supply, fill #0

## 2023-04-16 ENCOUNTER — Other Ambulatory Visit: Payer: Self-pay | Admitting: *Deleted

## 2023-04-16 DIAGNOSIS — O99213 Obesity complicating pregnancy, third trimester: Secondary | ICD-10-CM

## 2023-04-16 DIAGNOSIS — O10913 Unspecified pre-existing hypertension complicating pregnancy, third trimester: Secondary | ICD-10-CM

## 2023-04-16 DIAGNOSIS — O24113 Pre-existing diabetes mellitus, type 2, in pregnancy, third trimester: Secondary | ICD-10-CM

## 2023-04-17 ENCOUNTER — Telehealth: Payer: Self-pay | Admitting: Dietician

## 2023-04-17 NOTE — Telephone Encounter (Signed)
Dexcom Clarity report reviewed.  Fasting blood glucose 166 this am and 214 currently.  She has remained high all afternoon. Glucose improved after last pump adjustment but remains high. Message received from MD and will make pump adjustments today. Called patient and she was able to make the following changes.    Basal rate increased from 1.5 to 1.65  I:C ratio changed from 5 to 4.5.  Today's lunch consisted of bologna sandwich, tangerine and peach for a snack.  Beverage is water. Patient stated that she is bolusing correctly and counted carbohydrate correctly based on meal intake. She states that it seems that the PDM is losing charge faster than it should.  She states that she is always wearing a POD.  Patient to call for questions.  Oran Rein, RD, LDN, CDCES

## 2023-04-19 ENCOUNTER — Ambulatory Visit: Payer: Medicaid Other

## 2023-04-24 ENCOUNTER — Telehealth: Payer: Self-pay | Admitting: Dietician

## 2023-04-24 ENCOUNTER — Other Ambulatory Visit: Payer: Self-pay

## 2023-04-24 NOTE — Telephone Encounter (Signed)
04/12/2023  Called patient to make insulin pump adjustments.  Her blood glucose is not well controlled at this time and frequently is above 180 after meals.  I am also noting some low blood glucose.   Patient states that she is bolusing for carbs and blood glucose appropriately. She states that her pod is now out of insulin and needs changed.  She was unaware of this earlier.  Discussed that each POD should be filled with 200 units of insulin which should last 3 days.     Instructed patient how to share her Dexcom data with our office.  I can now view her data. Instructed patient to changed the basal rate from 1.4 to 1.5 units/hour. Instructed patient to change the Insulin to Carb ratio from 1:6 to 1:5. Patient was able to make these pump changes and verbalized this back to me.   Other pump settings include ISF (Insulin sensitivity factor) of 25 Target 90 Correct above 95   I will continue to monitor patient. Patient has my office and personal number to call if questions. She also has the [employer] number as well.   Oran Rein, RD, LDN, CDCES

## 2023-04-24 NOTE — Telephone Encounter (Signed)
Dexcom Clairity report reviewed.   Called patient.  She has not worn her Dexcom since 04/13/2023 and states that it is ready for pick up and she will get this today. Discussed that I cannot make changes to her pump as significant glucose data is not known.  I am also concerned about how patient is using her pump.  Discussed that patient should see me in person on 05/02/2023. I am out of the office this week.  Oran Rein, RD, LDN, CDCES

## 2023-04-25 ENCOUNTER — Ambulatory Visit (HOSPITAL_BASED_OUTPATIENT_CLINIC_OR_DEPARTMENT_OTHER): Payer: Medicaid Other

## 2023-04-25 ENCOUNTER — Other Ambulatory Visit (HOSPITAL_BASED_OUTPATIENT_CLINIC_OR_DEPARTMENT_OTHER): Payer: Self-pay

## 2023-04-25 ENCOUNTER — Other Ambulatory Visit: Payer: Self-pay

## 2023-04-25 ENCOUNTER — Ambulatory Visit: Payer: Medicaid Other | Attending: Maternal & Fetal Medicine | Admitting: *Deleted

## 2023-04-25 ENCOUNTER — Ambulatory Visit (INDEPENDENT_AMBULATORY_CARE_PROVIDER_SITE_OTHER): Payer: Medicaid Other | Admitting: Obstetrics and Gynecology

## 2023-04-25 VITALS — BP 133/85 | HR 111 | Wt 313.0 lb

## 2023-04-25 VITALS — BP 102/69 | HR 112

## 2023-04-25 DIAGNOSIS — Z7984 Long term (current) use of oral hypoglycemic drugs: Secondary | ICD-10-CM | POA: Diagnosis not present

## 2023-04-25 DIAGNOSIS — O0993 Supervision of high risk pregnancy, unspecified, third trimester: Secondary | ICD-10-CM | POA: Diagnosis not present

## 2023-04-25 DIAGNOSIS — Z91040 Latex allergy status: Secondary | ICD-10-CM | POA: Diagnosis not present

## 2023-04-25 DIAGNOSIS — Z9641 Presence of insulin pump (external) (internal): Secondary | ICD-10-CM | POA: Insufficient documentation

## 2023-04-25 DIAGNOSIS — O0943 Supervision of pregnancy with grand multiparity, third trimester: Secondary | ICD-10-CM

## 2023-04-25 DIAGNOSIS — Z794 Long term (current) use of insulin: Secondary | ICD-10-CM | POA: Insufficient documentation

## 2023-04-25 DIAGNOSIS — O99013 Anemia complicating pregnancy, third trimester: Secondary | ICD-10-CM

## 2023-04-25 DIAGNOSIS — O10013 Pre-existing essential hypertension complicating pregnancy, third trimester: Secondary | ICD-10-CM

## 2023-04-25 DIAGNOSIS — Z3A32 32 weeks gestation of pregnancy: Secondary | ICD-10-CM | POA: Insufficient documentation

## 2023-04-25 DIAGNOSIS — D696 Thrombocytopenia, unspecified: Secondary | ICD-10-CM

## 2023-04-25 DIAGNOSIS — O99213 Obesity complicating pregnancy, third trimester: Secondary | ICD-10-CM

## 2023-04-25 DIAGNOSIS — O09523 Supervision of elderly multigravida, third trimester: Secondary | ICD-10-CM

## 2023-04-25 DIAGNOSIS — A5901 Trichomonal vulvovaginitis: Secondary | ICD-10-CM

## 2023-04-25 DIAGNOSIS — O10913 Unspecified pre-existing hypertension complicating pregnancy, third trimester: Secondary | ICD-10-CM | POA: Insufficient documentation

## 2023-04-25 DIAGNOSIS — E669 Obesity, unspecified: Secondary | ICD-10-CM

## 2023-04-25 DIAGNOSIS — O328XX0 Maternal care for other malpresentation of fetus, not applicable or unspecified: Secondary | ICD-10-CM | POA: Insufficient documentation

## 2023-04-25 DIAGNOSIS — O09529 Supervision of elderly multigravida, unspecified trimester: Secondary | ICD-10-CM

## 2023-04-25 DIAGNOSIS — Z79899 Other long term (current) drug therapy: Secondary | ICD-10-CM | POA: Diagnosis not present

## 2023-04-25 DIAGNOSIS — O99113 Other diseases of the blood and blood-forming organs and certain disorders involving the immune mechanism complicating pregnancy, third trimester: Secondary | ICD-10-CM

## 2023-04-25 DIAGNOSIS — O24119 Pre-existing diabetes mellitus, type 2, in pregnancy, unspecified trimester: Secondary | ICD-10-CM

## 2023-04-25 DIAGNOSIS — Z7982 Long term (current) use of aspirin: Secondary | ICD-10-CM | POA: Diagnosis not present

## 2023-04-25 DIAGNOSIS — D563 Thalassemia minor: Secondary | ICD-10-CM | POA: Diagnosis not present

## 2023-04-25 DIAGNOSIS — O24113 Pre-existing diabetes mellitus, type 2, in pregnancy, third trimester: Secondary | ICD-10-CM

## 2023-04-25 DIAGNOSIS — O368320 Maternal care for abnormalities of the fetal heart rate or rhythm, second trimester, not applicable or unspecified: Secondary | ICD-10-CM | POA: Diagnosis present

## 2023-04-25 DIAGNOSIS — O98213 Gonorrhea complicating pregnancy, third trimester: Secondary | ICD-10-CM

## 2023-04-25 DIAGNOSIS — Z362 Encounter for other antenatal screening follow-up: Secondary | ICD-10-CM | POA: Insufficient documentation

## 2023-04-25 DIAGNOSIS — O3413 Maternal care for benign tumor of corpus uteri, third trimester: Secondary | ICD-10-CM

## 2023-04-25 DIAGNOSIS — O365930 Maternal care for other known or suspected poor fetal growth, third trimester, not applicable or unspecified: Secondary | ICD-10-CM

## 2023-04-25 DIAGNOSIS — D259 Leiomyoma of uterus, unspecified: Secondary | ICD-10-CM

## 2023-04-25 DIAGNOSIS — O0990 Supervision of high risk pregnancy, unspecified, unspecified trimester: Secondary | ICD-10-CM

## 2023-04-25 DIAGNOSIS — O23593 Infection of other part of genital tract in pregnancy, third trimester: Secondary | ICD-10-CM

## 2023-04-25 DIAGNOSIS — O99513 Diseases of the respiratory system complicating pregnancy, third trimester: Secondary | ICD-10-CM | POA: Diagnosis not present

## 2023-04-25 DIAGNOSIS — J45909 Unspecified asthma, uncomplicated: Secondary | ICD-10-CM | POA: Diagnosis not present

## 2023-04-25 MED ORDER — NOVOLOG FLEXPEN 100 UNIT/ML ~~LOC~~ SOPN: 28.0000 [IU] | PEN_INJECTOR | Freq: Three times a day (TID) | SUBCUTANEOUS | 11 refills | Status: DC

## 2023-04-25 MED ORDER — HUMULIN N KWIKPEN 100 UNIT/ML ~~LOC~~ SUPN: 21.0000 [IU] | PEN_INJECTOR | Freq: Two times a day (BID) | SUBCUTANEOUS | 11 refills | Status: DC

## 2023-04-25 NOTE — Progress Notes (Signed)
PRENATAL VISIT NOTE  Subjective:  Monique DANEHY is a 37 y.o. [id number] at [redacted]w[redacted]d being seen today for ongoing prenatal care.  She is currently monitored for the following issues for this high-risk pregnancy and has BMI 50.0-59.9, adult (HCC); Supervision of high risk pregnancy, antepartum; AMA (advanced maternal age) multigravida 35+, unspecified trimester; Alpha thalassemia silent carrier; Type 2 diabetes mellitus affecting pregnancy, antepartum  insulin, oral; Chronic hypertension affecting pregnancy; Uterine fibroid in pregnancy; Gonorrhea affecting pregnancy; T13/T18+ on Panorama; Fetal growth restriction antepartum; Benign gestational thrombocytopenia in third trimester [facility]); and Trichomonal vaginitis during pregnancy in third trimester on their problem list.  Patient reports  sugars are elevated .  Contractions: Irritability. Vag. Bleeding: None.  Movement: Present. Denies leaking of fluid.   The following portions of the patient's history were reviewed and updated as appropriate: allergies, current medications, past family history, past medical history, past social history, past surgical history and problem list.   Objective:   Vitals:   04/25/23 1419  BP: 133/85  Pulse: (!) 111  Weight: (!) 313 lb (142 kg)    Fetal Status: Fetal Heart Rate (bpm): 151   Movement: Present     General:  Alert, oriented and cooperative. Patient is in no acute distress.  Skin: Skin is warm and dry. No rash noted.   Cardiovascular: Normal heart rate noted  Respiratory: Normal respiratory effort, no problems with respiration noted  Abdomen: Soft, gravid, appropriate for gestational age.  Pain/Pressure: Present     Pelvic: Cervical exam deferred        Extremities: Normal range of motion.  Edema: None  Mental Status: Normal mood and affect. Normal behavior. Normal judgment and thought content.   Assessment and Plan:  Pregnancy: [id number] at [redacted]w[redacted]d 1. AMA (advanced maternal age) multigravida 35+,  unspecified trimester HR T13/18 on panorma.  AFP negative.  Seen by [facility] on 11/2022 and declined any other testing   2. Abnormal genetic test during pregnancy See above   3. Supervision of high risk pregnancy, antepartum Not sure about birth control  Continue low dose ASA   4. [redacted] weeks gestation of pregnancy   5. Fetal growth restriction antepartum Resolved although likely from large [facility].  8/22: 21%, 1426gm, ac 88% but with FL/BPD/HC all <1%, afi 18, UA dopplers wnl, bpp 8/8 MFM to continue with weekly monitoring; next one later today   6. Chronic hypertension affecting pregnancy Continue procardia 30 qday   7. BMI 50.0-59.9, adult (HCC) 6lbs in past two weeks, 33lbs total weight gain. Continue to follow   8. Obesity in pregnancy, antepartum, third trimester   9. Trichomonas affecting pregnancy in third trimester Treated 8/17 in mau Test of cure mid to late sept   10. Type 2 diabetes mellitus affecting pregnancy, antepartum  insulin, oral Patient on dexcom and omnipod and has been in touch with DM education and adjusting settings but still elevated. AM fasting this morning in the 170s and post prandials in the low 200s.  Patient was on nph prior to getting on omnipod and I told her I recommend stopping the omnipod and going back on SQ insulin.  Message sent to Oran Rein about transitioning her off omnipod to SQ bid NPH (21 with breakfast and 21 with dinner) and tidac aspart (28/28/28) Patient would like to hold off on return OB on 9/12. I told her if sugars are still elevated then can add her on after her DM visit.    11. Uterine fibroid in pregnancy  Not seen on 8/22   12. Benign gestational thrombocytopenia in third trimester [facility]) Repeat mid to late sept    Latest Ref Rng & Units 04/11/2023    4:52 PM 04/06/2023    2:30 AM 03/27/2023   10:23 AM  CBC  WBC 3.4 - 10.8 x10E3/uL 8.8  8.9  8.7   Hemoglobin 11.1 - 15.9 g/dL 13.2  44.0  10.2   Hematocrit 34.0 - 46.6 % 43.0  38.4   37.6   Platelets 150 - 450 x10E3/uL 133  121  111     Preterm labor symptoms and general obstetric precautions including but not limited to vaginal bleeding, contractions, leaking of fluid and fetal movement were reviewed in detail with the patient. Please refer to After Visit Summary for other counseling recommendations.   No follow-ups on file.  Future Appointments  Date Time Provider Department Center  04/25/2023  3:30 PM [facility] NURSE [facility]  04/25/2023  3:45 PM [facility] US4 [facility]  05/02/2023  9:15 AM [facility] NURSE [facility]  05/02/2023  9:30 AM [facility] US3 [facility]  05/02/2023 12:00 PM Bonnita Levan, RD [facility]  05/09/2023 10:15 AM Reva Bores, MD [facility]  05/20/2023 10:15 AM Adam Phenix, MD [facility]    Bartlett Bing, MD

## 2023-04-26 ENCOUNTER — Other Ambulatory Visit: Payer: Self-pay

## 2023-04-26 ENCOUNTER — Observation Stay (HOSPITAL_COMMUNITY)
Admission: AD | Admit: 2023-04-26 | Discharge: 2023-04-29 | Disposition: A | Discharge status: Home / Self Care | Payer: Medicaid Other | Attending: Obstetrics and Gynecology | Admitting: Obstetrics and Gynecology

## 2023-04-26 ENCOUNTER — Encounter (HOSPITAL_COMMUNITY): Payer: Self-pay | Admitting: Obstetrics & Gynecology

## 2023-04-26 DIAGNOSIS — Z7982 Long term (current) use of aspirin: Secondary | ICD-10-CM | POA: Insufficient documentation

## 2023-04-26 DIAGNOSIS — Z794 Long term (current) use of insulin: Secondary | ICD-10-CM

## 2023-04-26 DIAGNOSIS — O328XX0 Maternal care for other malpresentation of fetus, not applicable or unspecified: Secondary | ICD-10-CM | POA: Diagnosis present

## 2023-04-26 DIAGNOSIS — Z7984 Long term (current) use of oral hypoglycemic drugs: Secondary | ICD-10-CM | POA: Insufficient documentation

## 2023-04-26 DIAGNOSIS — O09529 Supervision of elderly multigravida, unspecified trimester: Secondary | ICD-10-CM

## 2023-04-26 DIAGNOSIS — O365930 Maternal care for other known or suspected poor fetal growth, third trimester, not applicable or unspecified: Secondary | ICD-10-CM | POA: Diagnosis present

## 2023-04-26 DIAGNOSIS — Z3A32 32 weeks gestation of pregnancy: Secondary | ICD-10-CM | POA: Diagnosis not present

## 2023-04-26 DIAGNOSIS — O24113 Pre-existing diabetes mellitus, type 2, in pregnancy, third trimester: Principal | ICD-10-CM | POA: Insufficient documentation

## 2023-04-26 DIAGNOSIS — I10 Essential (primary) hypertension: Secondary | ICD-10-CM | POA: Diagnosis present

## 2023-04-26 DIAGNOSIS — Z79899 Other long term (current) drug therapy: Secondary | ICD-10-CM | POA: Diagnosis not present

## 2023-04-26 DIAGNOSIS — Z880 Allergy status to penicillin: Secondary | ICD-10-CM | POA: Diagnosis not present

## 2023-04-26 DIAGNOSIS — O368320 Maternal care for abnormalities of the fetal heart rate or rhythm, second trimester, not applicable or unspecified: Secondary | ICD-10-CM | POA: Diagnosis not present

## 2023-04-26 DIAGNOSIS — O99513 Diseases of the respiratory system complicating pregnancy, third trimester: Secondary | ICD-10-CM | POA: Insufficient documentation

## 2023-04-26 DIAGNOSIS — O0990 Supervision of high risk pregnancy, unspecified, unspecified trimester: Secondary | ICD-10-CM

## 2023-04-26 DIAGNOSIS — J4540 Moderate persistent asthma, uncomplicated: Secondary | ICD-10-CM | POA: Diagnosis present

## 2023-04-26 DIAGNOSIS — Z148 Genetic carrier of other disease: Secondary | ICD-10-CM | POA: Diagnosis not present

## 2023-04-26 DIAGNOSIS — Z91040 Latex allergy status: Secondary | ICD-10-CM

## 2023-04-26 DIAGNOSIS — J45909 Unspecified asthma, uncomplicated: Secondary | ICD-10-CM | POA: Insufficient documentation

## 2023-04-26 DIAGNOSIS — O99213 Obesity complicating pregnancy, third trimester: Secondary | ICD-10-CM | POA: Diagnosis present

## 2023-04-26 DIAGNOSIS — E1169 Type 2 diabetes mellitus with other specified complication: Secondary | ICD-10-CM | POA: Diagnosis not present

## 2023-04-26 DIAGNOSIS — O10913 Unspecified pre-existing hypertension complicating pregnancy, third trimester: Secondary | ICD-10-CM | POA: Diagnosis present

## 2023-04-26 DIAGNOSIS — O10919 Unspecified pre-existing hypertension complicating pregnancy, unspecified trimester: Secondary | ICD-10-CM | POA: Diagnosis present

## 2023-04-26 DIAGNOSIS — O0993 Supervision of high risk pregnancy, unspecified, third trimester: Secondary | ICD-10-CM | POA: Insufficient documentation

## 2023-04-26 DIAGNOSIS — Z6841 Body Mass Index (BMI) 40.0 and over, adult: Secondary | ICD-10-CM

## 2023-04-26 DIAGNOSIS — O365990 Maternal care for other known or suspected poor fetal growth, unspecified trimester, not applicable or unspecified: Secondary | ICD-10-CM | POA: Diagnosis present

## 2023-04-26 DIAGNOSIS — E669 Obesity, unspecified: Secondary | ICD-10-CM | POA: Diagnosis not present

## 2023-04-26 DIAGNOSIS — E1165 Type 2 diabetes mellitus with hyperglycemia: Secondary | ICD-10-CM | POA: Diagnosis present

## 2023-04-26 DIAGNOSIS — O24119 Pre-existing diabetes mellitus, type 2, in pregnancy, unspecified trimester: Secondary | ICD-10-CM | POA: Diagnosis present

## 2023-04-26 DIAGNOSIS — Z7951 Long term (current) use of inhaled steroids: Secondary | ICD-10-CM | POA: Diagnosis not present

## 2023-04-26 LAB — CBC
HCT: 38.9 % (ref 36.0–46.0)
Hemoglobin: 12.9 g/dL (ref 12.0–15.0)
MCH: 27.5 pg (ref 26.0–34.0)
MCHC: 33.2 g/dL (ref 30.0–36.0)
MCV: 82.9 fL (ref 80.0–100.0)
Platelets: 119 10*3/uL — ABNORMAL LOW (ref 150–400)
RBC: 4.69 MIL/uL (ref 3.87–5.11)
RDW: 14.9 % (ref 11.5–15.5)
WBC: 7.3 10*3/uL (ref 4.0–10.5)
nRBC: 0 % (ref 0.0–0.2)

## 2023-04-26 LAB — TYPE AND SCREEN
ABO/RH(D): A POS
Antibody Screen: NEGATIVE

## 2023-04-26 LAB — GLUCOSE, CAPILLARY
Glucose-Capillary: 180 mg/dL — ABNORMAL HIGH (ref 70–99)
Glucose-Capillary: 220 mg/dL — ABNORMAL HIGH (ref 70–99)
Glucose-Capillary: 203 mg/dL — ABNORMAL HIGH (ref 70–99)
Glucose-Capillary: 231 mg/dL — ABNORMAL HIGH (ref 70–99)
Glucose-Capillary: 168 mg/dL — ABNORMAL HIGH (ref 70–99)

## 2023-04-26 MED ORDER — DEXTROSE 50 % IV SOLN: 0.0000 mL | INTRAVENOUS | Status: DC | PRN

## 2023-04-26 MED ORDER — DOCUSATE SODIUM 100 MG PO CAPS
100.0000 mg | ORAL_CAPSULE | Freq: Every day | ORAL | Status: DC
  Administered 2023-04-27 – 2023-04-29 (×3): 100 mg via ORAL
  Filled 2023-04-26 (×3): qty 1

## 2023-04-26 MED ORDER — NIFEDIPINE ER OSMOTIC RELEASE 30 MG PO TB24
30.0000 mg | ORAL_TABLET | Freq: Every day | ORAL | Status: DC
  Administered 2023-04-27 – 2023-04-29 (×3): 30 mg via ORAL
  Filled 2023-04-26 (×3): qty 1

## 2023-04-26 MED ORDER — LACTATED RINGERS IV SOLN: INTRAVENOUS | Status: DC

## 2023-04-26 MED ORDER — CYCLOBENZAPRINE HCL 10 MG PO TABS
10.0000 mg | ORAL_TABLET | Freq: Three times a day (TID) | ORAL | Status: DC | PRN
  Administered 2023-04-27 – 2023-04-29 (×3): 10 mg via ORAL
  Filled 2023-04-26 (×3): qty 1

## 2023-04-26 MED ORDER — INSULIN REGULAR(HUMAN) IN NACL 100-0.9 UT/100ML-% IV SOLN
INTRAVENOUS | Status: AC
  Administered 2023-04-26: 7 [IU]/h via INTRAVENOUS
  Administered 2023-04-27: 3.2 [IU]/h via INTRAVENOUS
  Filled 2023-04-26 (×2): qty 100

## 2023-04-26 MED ORDER — LACTATED RINGERS IV SOLN: 125.0000 mL/h | INTRAVENOUS | Status: AC

## 2023-04-26 MED ORDER — DEXTROSE IN LACTATED RINGERS 5 % IV SOLN: INTRAVENOUS | Status: DC

## 2023-04-26 MED ORDER — CALCIUM CARBONATE ANTACID 500 MG PO CHEW: 2.0000 | CHEWABLE_TABLET | ORAL | Status: DC | PRN

## 2023-04-26 MED ORDER — VITAFOL GUMMIES 3.33-0.333-34.8 MG PO CHEW: 3.0000 | CHEWABLE_TABLET | Freq: Every day | ORAL | Status: DC

## 2023-04-26 MED ORDER — PRENATAL MULTIVITAMIN CH
1.0000 | ORAL_TABLET | Freq: Every day | ORAL | Status: DC
  Administered 2023-04-27 – 2023-04-29 (×3): 1 via ORAL
  Filled 2023-04-26 (×3): qty 1

## 2023-04-26 MED ORDER — ACETAMINOPHEN 325 MG PO TABS
650.0000 mg | ORAL_TABLET | ORAL | Status: DC | PRN
  Administered 2023-04-28 – 2023-04-29 (×2): 650 mg via ORAL
  Filled 2023-04-26 (×2): qty 2

## 2023-04-26 MED ORDER — ASPIRIN 81 MG PO TBEC
162.0000 mg | DELAYED_RELEASE_TABLET | Freq: Every day | ORAL | Status: DC
  Administered 2023-04-27 – 2023-04-29 (×3): 162 mg via ORAL
  Filled 2023-04-26 (×3): qty 2

## 2023-04-26 MED ORDER — ALBUTEROL SULFATE (2.5 MG/3ML) 0.083% IN NEBU: 2.5000 mg | INHALATION_SOLUTION | Freq: Four times a day (QID) | RESPIRATORY_TRACT | Status: DC | PRN

## 2023-04-26 NOTE — H&P (Signed)
FACULTY PRACTICE ANTEPARTUM ADMISSION HISTORY AND PHYSICAL NOTE   History of Present Illness: Monique Schmidt is a 37 y.o. [id number] at [redacted]w[redacted]d with poorly controlled T2DM admitted for glycemic control.  Also has CHTN, morbid obesity with a BMI 50.   Patient reports the fetal movement as active. Patient reports uterine contraction  activity as none. Patient reports  vaginal bleeding as none. Patient describes fluid per vagina as none. Fetal presentation is  footling breech as of 04/25/23 ultrasound .  Patient Active Problem List   Diagnosis Date Noted   Uncontrolled diabetes mellitus with hyperglycemia (HCC) 04/26/2023   Trichomonal vaginitis during pregnancy in third trimester 04/14/2023   Benign gestational thrombocytopenia in third trimester (HCC) 04/11/2023   Fetal growth restriction antepartum 03/01/2023   T13/T18+ on Panorama 02/26/2023   Gonorrhea affecting pregnancy 01/22/2023   Uterine fibroid in pregnancy 12/20/2022   Chronic hypertension affecting pregnancy 12/12/2022   Type 2 diabetes mellitus affecting pregnancy, antepartum  insulin, oral 12/11/2022   Alpha thalassemia silent carrier 12/08/2022   Supervision of high risk pregnancy, antepartum 11/19/2022   AMA (advanced maternal age) multigravida 35+, unspecified trimester 11/19/2022   BMI 50.0-59.9, adult (HCC) 11/09/2013    Past Medical History:  Diagnosis Date   Abnormal maternal serum screening test 02/26/2023   HR due to low FF     Abnormal Pap smear    Bacterial vaginosis    Gestational diabetes    likely pre-exisiting; all pregnancies glyburide started 11/4   Gestational thrombocytopenia (HCC)    Headache(784.0)    Hernia, umbilical    History of abnormal cervical Pap smear    normal 2014   History of gestational diabetes 06/26/2019   Mild preeclampsia delivered 01/22/2016   Moderate persistent asthma 08/07/2013   Obesity complicating pregnancy 09/21/2013   Type 2 diabetes mellitus (HCC)     Past Surgical  History:  Procedure Laterality Date   UMBILICAL HERNIA REPAIR N/A 11/22/2014   Procedure: LAPAROSCOPIC UMBILICAL HERNIA;  Surgeon: Axel Filler, MD;  Location: [facility] OR;  Service: General;  Laterality: N/A;   WISDOM TOOTH EXTRACTION      OB History  Gravida Para Term Preterm AB Living  7 5 5   1 5   SAB IAB Ectopic Multiple Live Births  1 0 0 0 5    # Outcome Date GA Lbr Len/2nd Weight Sex Type Anes PTL Lv  7 Current           6 Term 01/20/16 106w3d 00:55 / 00:08 4414 g F Vag-Spont None  LIV     Birth Comments: none  5 Term 12/15/13 [redacted]w[redacted]d 06:43 / 00:02 4020 g F Vag-Spont None  LIV  4 Term 06/26/12 [redacted]w[redacted]d 21:07 / 00:01 3232 g F Vag-Spont None  LIV  3 Term 03/08/11 [redacted]w[redacted]d 01:27 / 00:02 3884 g F Vag-Spont None  LIV     Birth Comments: none  2 Term 05/19/09 [redacted]w[redacted]d   F Vag-Spont None N LIV  1 SAB             Social History   Socioeconomic History   Marital status: Divorced    Spouse name: Not on file   Number of children: Not on file   Years of education: Not on file   Highest education level: Not on file  Occupational History   Not on file  Tobacco Use   Smoking status: Never   Smokeless tobacco: Never  Vaping Use   Vaping status: Never Used  Substance and Sexual Activity  Alcohol use: No   Drug use: No   Sexual activity: Not Currently    Birth control/protection: None  Other Topics Concern   Not on file  Social History Narrative   Not on file   Social Determinants of Health   Financial Resource Strain: Not on file  Food Insecurity: Food Insecurity Present (04/26/2023)   Hunger Vital Sign    Worried About Running Out of Food in the Last Year: Sometimes true    Ran Out of Food in the Last Year: Sometimes true  Transportation Needs: No Transportation Needs (04/26/2023)   PRAPARE - Administrator, Civil Service (Medical): No    Lack of Transportation (Non-Medical): No  Physical Activity: Not on file  Stress: Not on file  Social Connections: Unknown (10/05/2022)    [facility] from [facility]   Social Network    Social Network: Not on file    Family History  Problem Relation Age of Onset   Hypertension Father    Diabetes Father    Diabetes Mother    Seizures Brother     Allergies  Allergen Reactions   Latex Itching and Rash   Penicillins Itching and Rash    Did it involve swelling of the face/tongue/throat, SOB, or low BP? N Did it involve sudden or severe rash/hives, skin peeling, or any reaction on the inside of your mouth or nose? Y Did you need to seek medical attention at a hospital or doctor's office? No When did it last happen?  Several  Years Ago    If all above answers are "NO", may proceed with cephalosporin use.        Medications Prior to Admission  Medication Sig Dispense Refill Last Dose   aspirin EC 81 MG tablet Take 2 tablets (162 mg total) by mouth daily. Start taking when you are [redacted] weeks pregnant for rest of pregnancy for prevention of preeclampsia 300 tablet 2 04/26/2023   metFORMIN (GLUCOPHAGE-XR) 500 MG 24 hr tablet Take 1 tablet (500 mg total) by mouth 2 (two) times daily with a meal. 60 tablet 2 04/25/2023   NIFEdipine (PROCARDIA XL) 30 MG 24 hr tablet Take 1 tablet (30 mg total) by mouth daily. 30 tablet 6 04/26/2023   Accu-Chek Softclix Lancets lancets Use four times per day as instructed 100 each 12    acetaminophen (TYLENOL) 500 MG tablet Take 2 tablets (1,000 mg total) by mouth every 6 (six) hours as needed for fever, headache or mild pain. 30 tablet 0    albuterol (VENTOLIN HFA) 108 (90 Base) MCG/ACT inhaler Inhale 1-2 puffs into the lungs every 6 (six) hours as needed for wheezing or shortness of breath. 18 g 1    BD PEN NEEDLE NANO 2ND GEN 32G X 4 MM MISC Inject into the skin 2 (two) times daily.      blood glucose meter kit and supplies KIT Dispense based on patient and insurance preference. Use up to four times daily as directed. 1 each 0    cetirizine (ZYRTEC ALLERGY) 10 MG tablet Take 1 tablet (10 mg total)  by mouth daily. 30 tablet 5    Continuous Glucose Sensor (DEXCOM G7 SENSOR) MISC Replace sensor every 10 days 3 each 5    cyclobenzaprine (FLEXERIL) 10 MG tablet Take 1 tablet (10 mg total) by mouth every 8 (eight) hours as needed for muscle spasms. 30 tablet 0    fluticasone (FLOVENT HFA) 44 MCG/ACT inhaler Inhale 2 puffs into the lungs 2 (two)  times daily. 10.6 g 1    glucose blood (ACCU-CHEK GUIDE) test strip Use four times per day as instructed 100 each 12    insulin aspart (NOVOLOG FLEXPEN) 100 UNIT/ML FlexPen Inject 28 Units into the skin 3 (three) times daily with meals. 15 mL 11    Insulin Disposable Pump (OMNIPOD DASH PODS, GEN 4,) MISC Use as directed every other day. 15 each 5    Insulin NPH, Human,, Isophane, (HUMULIN N KWIKPEN) 100 UNIT/ML Kiwkpen Inject 21 Units into the skin 2 (two) times daily with a meal. Breakfast and dinner 15 mL 11    Insulin Syringe-Needle U-100 (INSULIN SYRINGE 1CC/31GX5/16") 31G X 5/16" 1 ML MISC Use 2 (two) times daily. 100 each 6    Prenatal Vit-Fe Phos-FA-Omega (VITAFOL GUMMIES) 3.33-0.333-34.8 MG CHEW Chew 3 tablets by mouth daily. 90 tablet 11     Review of Systems - Negative except what is in HPI  Vitals:  BP 125/75   Pulse 100   Temp 98.4 F (36.9 C) (Oral)   Resp 18   Ht 5\' 6"  (1.676 m)   Wt (!) 142.7 kg   LMP 09/09/2022 (Approximate)   SpO2 100%   BMI 50.79 kg/m  Physical Examination: CONSTITUTIONAL: Well-developed, well-nourished female in no acute distress.  HENT:  Normocephalic, atraumatic, External right and left ear normal. Oropharynx is clear and moist EYES: Conjunctivae and EOM are normal. Pupils are equal, round, and reactive to light. No scleral icterus.  NECK: Normal range of motion, supple, no masses SKIN: Skin is warm and dry. No rash noted. Not diaphoretic. No erythema. No pallor. NEUROLOGIC: Alert and oriented to person, place, and time. Normal reflexes, muscle tone coordination. No cranial nerve deficit  noted. PSYCHIATRIC: Normal mood and affect. Normal behavior. Normal judgment and thought content. CARDIOVASCULAR: Normal heart rate noted, regular rhythm RESPIRATORY: Effort and breath sounds normal, no problems with respiration noted ABDOMEN: Soft, nontender, nondistended, gravid. MUSCULOSKELETAL: Normal range of motion. No edema and no tenderness. 2+ distal pulses.  Cervix:Deferred. Membranes:intact Fetal Monitoring:Baseline: 140 bpm, Variability: Moderate, Accelerations: Reactive, and Decelerations: Absent Tocometer: Flat  Labs:  Results for orders placed or performed during the hospital encounter of 04/26/23 (from the past 24 hour(s))  Type and screen [facility]   Collection Time: 04/26/23  2:57 PM  Result Value Ref Range   ABO/RH(D) A POS    Antibody Screen NEG    Sample Expiration      04/29/2023,2359 Performed at [facility], [address], [place], [place] [address]   CBC   Collection Time: 04/26/23  3:04 PM  Result Value Ref Range   WBC 7.3 4.0 - 10.5 K/uL   RBC 4.69 3.87 - 5.11 MIL/uL   Hemoglobin 12.9 12.0 - 15.0 g/dL   HCT 60.4 54.0 - 98.1 %   MCV 82.9 80.0 - 100.0 fL   MCH 27.5 26.0 - 34.0 pg   MCHC 33.2 30.0 - 36.0 g/dL   RDW 19.1 47.8 - 29.5 %   Platelets 119 (L) 150 - 400 K/uL   nRBC 0.0 0.0 - 0.2 %  Glucose, capillary   Collection Time: 04/26/23  3:42 PM  Result Value Ref Range   Glucose-Capillary 180 (H) 70 - 99 mg/dL  Glucose, capillary   Collection Time: 04/26/23  4:56 PM  Result Value Ref Range   Glucose-Capillary 220 (H) 70 - 99 mg/dL  Glucose, capillary   Collection Time: 04/26/23  5:32 PM  Result Value Ref Range  Glucose-Capillary 231 (H) 70 - 99 mg/dL  Glucose, capillary   Collection Time: 04/26/23  6:04 PM  Result Value Ref Range   Glucose-Capillary 203 (H) 70 - 99 mg/dL    Imaging Studies: [place] MFM FETAL BPP WO NON STRESS  Result Date:  04/25/2023 ----------------------------------------------------------------------  OBSTETRICS REPORT                       (Signed Final 04/25/2023 05:06 pm) ---------------------------------------------------------------------- Patient Info  ID #:       [id number]                          D.O.B.:  1986-02-08 (37 yrs)  Name:       Monique Schmidt                Visit Date: 04/25/2023 03:48 pm ---------------------------------------------------------------------- Performed By  Attending:        Noralee Space MD        Ref. Address:     [address]                                                             [place], [place]                                                             [address]  Performed By:     Eden Lathe BS      [facility]                    RD[facility] RVT                                 [facility]  Referred By:      [facility] ---------------------------------------------------------------------- Orders  #  Description                           Code        Ordered By  1  [place] MFM FETAL BPP WO NON  78295.62    BURK SCHAIBLE     STRESS  2  [place] MFM UA CORD DOPPLER                13086.57    [facility] ----------------------------------------------------------------------  #  Order #                     Accession #                Episode #  1  [id number]                   [id number]                 [id number]  2  [id number]                   [id number]                 [id number] ---------------------------------------------------------------------- Indications  Obesity complicating pregnancy, third          O99.213  trimester (pregravid BMI 48)  Pre-existing diabetes, type 2, in pregnancy,   O24.113  third trimester  Abnormal chromosomal and genetic finding       O28.5  on antenatal screening of mother  (HIGH  RISK due to fetal DNA fraction--> repeat  normal))  Hypertension - Chronic/Pre-existing            O10.019  (nifedipine)  Advanced maternal age multigravida 49+,        O39.523  third trimester  Grand multiparity, antepartum                  O09.40  Uterine fibroids affecting pregnancy in third  O34.13, D25.9  trimester, antepartum  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 Cord Insertion:      Previously seen  Amniotic Fluid  AFI FV:      Within normal limits  AFI Sum(cm)     %Tile       Largest Pocket(cm)  16.8            61          6.47  RUQ(cm)       RLQ(cm)       LUQ(cm)        LLQ(cm)  2.89          4.31          3.13           6.47 ---------------------------------------------------------------------- Biophysical Evaluation  Amniotic F.V:   Within normal limits       F. Tone:        Observed  F. Movement:    Observed                   Score:  8/8  F. Breathing:   Observed ---------------------------------------------------------------------- Biometry  LV:        5.8  mm ---------------------------------------------------------------------- OB History  Gravidity:    7         Term:   5 ---------------------------------------------------------------------- Gestational Age  LMP:           32w 4d        Date:  09/09/22                  EDD:   06/16/23  Best:          32w 1d     Det. ByMarcella Dubs         EDD:   06/19/23                                      (11/29/22) ---------------------------------------------------------------------- Anatomy  Ventricles:            Appears normal          Kidneys:                Appear normal  Stomach:               Appears normal, left   Bladder:                Appears normal                         sided ---------------------------------------------------------------------- Doppler - Fetal Vessels  Umbilical Artery   S/D     %tile      RI    %tile      PI    %tile     PSV    ADFV    RDFV                                                     (cm/s)   2.81       59    0.64       63    1.01       72    41.43      No      No ---------------------------------------------------------------------- Impression  Amniotic fluid is normal and good fetal activity is seen  .Antenatal testing is reassuring. BPP 8/8.  Footling breech  presentation.  Patient reports her fasting levels are in the 170s and  postprandial levels are above 200 mg/dL.  She is on  OmniPod and Dexcom.  Chronic hypertension.  Blood pressure today at our office is  102/69 mmHg.  She takes nifedipine.  I counseled the patient that poorly controlled diabetes is  associated with increased risk of stillbirth.  I recommended  inpatient management to control diabetes.  Patient will discuss with the family and may consider  admission on Monday (04/29/2023). ---------------------------------------------------------------------- Recommendations  -Patient is considering admission. ----------------------------------------------------------------------                 Noralee Space, MD Electronically Signed Final Report   04/25/2023 05:06 pm ----------------------------------------------------------------------   [place] MFM UA CORD DOPPLER  Result Date: 04/25/2023 ----------------------------------------------------------------------  OBSTETRICS REPORT                       (  Signed Final 04/25/2023 05:06 pm) ---------------------------------------------------------------------- Patient Info  ID #:       [id number]                          D.O.B.:  29-Jun-1986 (37 yrs)  Name:       Monique Schmidt                Visit Date:  04/25/2023 03:48 pm ---------------------------------------------------------------------- Performed By  Attending:        Noralee Space MD        Ref. Address:     [address]                                                             [place], [place]                                                             [address]  Performed By:     Eden Lathe BS      Location[facility]                    RD[facility] RVT                                 [facility]  Referred By:      [facility] ---------------------------------------------------------------------- Orders  #  Description                           Code        Ordered By  1  [place] MFM FETAL BPP WO NON               76819.01    BURK SCHAIBLE     STRESS  2  [place] MFM UA CORD DOPPLER                59563.87    BURK SCHAIBLE ----------------------------------------------------------------------  #  Order #  Accession #                Episode #  1  [id number]                   [id number]                 [id number]  2  [id number]                   [id number]                 [id number] ---------------------------------------------------------------------- Indications  Obesity complicating pregnancy, third          O99.213  trimester (pregravid BMI 48)  Pre-existing diabetes, type 2, in pregnancy,   O24.113  third trimester  Abnormal chromosomal and genetic finding       O28.5  on antenatal screening of mother (HIGH  RISK due to fetal DNA fraction--> repeat  normal))  Hypertension - Chronic/Pre-existing            O10.019  (nifedipine)  Advanced maternal age multigravida 53+,        O4.523  third trimester  Grand multiparity, antepartum                  O09.40  Uterine fibroids affecting pregnancy in third  O34.13, D25.9  trimester, antepartum  Genetic carrier (SILENT CARRIER  for Alpha-     Z14.8  Thalassemia)  Encounter for other antenatal screening        Z36.2  follow-up  [redacted] weeks gestation of pregnancy                Z3A.32 ---------------------------------------------------------------------- Fetal Evaluation  Num Of Fetuses:         1  Fetal Heart Rate(bpm):  124  Cardiac Activity:       Observed  Presentation:           Breech, footling  Placenta:               Posterior  P. Cord Insertion:      Previously seen  Amniotic Fluid  AFI FV:      Within normal limits  AFI Sum(cm)     %Tile       Largest Pocket(cm)  16.8            61          6.47  RUQ(cm)       RLQ(cm)       LUQ(cm)        LLQ(cm)  2.89          4.31          3.13           6.47 ---------------------------------------------------------------------- Biophysical Evaluation  Amniotic F.V:   Within normal limits       F. Tone:        Observed  F. Movement:    Observed                   Score:          8/8  F. Breathing:   Observed ---------------------------------------------------------------------- Biometry  LV:        5.8  mm ---------------------------------------------------------------------- OB History  Gravidity:    7         Term:   5 ---------------------------------------------------------------------- Gestational Age  LMP:           32w 4d  Date:  09/09/22                  EDD:   06/16/23  Best:          32w 1d     Det. ByMarcella Dubs         EDD:   06/19/23                                      (11/29/22) ---------------------------------------------------------------------- Anatomy  Ventricles:            Appears normal         Kidneys:                Appear normal  Stomach:               Appears normal, left   Bladder:                Appears normal                         sided ---------------------------------------------------------------------- Doppler - Fetal Vessels  Umbilical Artery   S/D     %tile      RI    %tile      PI    %tile     PSV    ADFV    RDFV                                                      (cm/s)   2.81       59    0.64       63    1.01       72    41.43      No      No ---------------------------------------------------------------------- Impression  Amniotic fluid is normal and good fetal activity is seen  .Antenatal testing is reassuring. BPP 8/8.  Footling breech  presentation.  Patient reports her fasting levels are in the 170s and  postprandial levels are above 200 mg/dL.  She is on  OmniPod and Dexcom.  Chronic hypertension.  Blood pressure today at our office is  102/69 mmHg.  She takes nifedipine.  I counseled the patient that poorly controlled diabetes is  associated with increased risk of stillbirth.  I recommended  inpatient management to control diabetes.  Patient will discuss with the family and may consider  admission on Monday (04/29/2023). ---------------------------------------------------------------------- Recommendations  -Patient is considering admission. ----------------------------------------------------------------------                 Noralee Space, MD Electronically Signed Final Report   04/25/2023 05:06 pm ----------------------------------------------------------------------   [place] MFM OB FOLLOW UP  Result Date: 04/11/2023 ----------------------------------------------------------------------  OBSTETRICS REPORT                       (Signed Final 04/11/2023 03:49 pm) ---------------------------------------------------------------------- Patient Info  ID #:       [id number]                          D.O.B.:  10-31-1985 (37 yrs)  Name:       Monique Schmidt  Visit Date: 04/11/2023 02:44 pm ---------------------------------------------------------------------- Performed By  Attending:        Ma Rings MD         Ref. Address:     [address]                                                             [place], [place]                                                             [address]  Performed By:     Marcellina Millin       [facility]                    RD[facility]                                     [facility]  Referred By:      [facility] ---------------------------------------------------------------------- Orders  #  Description                           Code        Ordered By  1  [place] MFM OB FOLLOW UP                   [id number]    RAVI [facility]  2  [place] MFM UA CORD DOPPLER                76820.02    RAVI SHANKAR  3  [place] MFM FETAL BPP                      82956.2     YU FANG     W/NONSTRESS ----------------------------------------------------------------------  #  Order #                     Accession #                Episode #  1  [id number]  [id number]                 [id number]  2  [id number]                   [id number]                 [id number]  3  [id number]                   [id number]                 [id number] ---------------------------------------------------------------------- Indications  Obesity complicating pregnancy, third          O99.213  trimester (pregravid BMI 48)  Pre-existing diabetes, type 2, in pregnancy,   O24.113  third trimester  Abnormal chromosomal and genetic finding       O28.5  on antenatal screening of mother (HIGH  RISK due to fetal DNA fraction--> repeat  normal))  Hypertension - Chronic/Pre-existing            O10.019  (nifedipine)  Advanced maternal age multigravida 41+,        O54.523  third trimester  Grand multiparity, antepartum                  O09.40  Uterine fibroids affecting pregnancy in third  O34.13, D25.9  trimester, antepartum  Genetic carrier (SILENT CARRIER for Alpha-     Z14.8  Thalassemia)  Encounter for other antenatal screening        Z36.2  follow-up  [redacted] weeks gestation of pregnancy                Z3A.30 ---------------------------------------------------------------------- Vital Signs  BP:           136/86 ---------------------------------------------------------------------- Fetal Evaluation  Num Of Fetuses:         1  Fetal Heart Rate(bpm):  144  Cardiac Activity:       Observed  Presentation:           Variable  Placenta:               Posterior  P. Cord Insertion:      Previously seen  Amniotic Fluid  AFI FV:      Within normal limits  AFI Sum(cm)     %Tile       Largest Pocket(cm)  18.53           70          5.47  RUQ(cm)       RLQ(cm)       LUQ(cm)        LLQ(cm)  5.47          4.64          3.33           5.09 ---------------------------------------------------------------------- Biophysical Evaluation  Amniotic F.V:   Pocket => 2 cm             F. Tone:        Observed  F. Movement:    Observed                   Score:          8/8  F. Breathing:   Observed ---------------------------------------------------------------------- Biometry  BPD:      66.8  mm     G. Age:  26w 6d        < 1  %    CI:  67.78   %    70 - 86                                                          FL/HC:      19.6   %    19.2 - 21.4  HC:      259.6  mm     G. Age:  28w 2d        < 1  %    HC/AC:      0.94        0.99 - 1.21  AC:      277.1  mm     G. Age:  31w 5d         88  %    FL/BPD:     76.0   %    71 - 87  FL:       50.8  mm     G. Age:  27w 2d        < 1  %    FL/AC:      18.3   %    20 - 24  Est. FW:    1426  gm      3 lb 2 oz     21  % ---------------------------------------------------------------------- OB History  Gravidity:    7         Term:   5 ---------------------------------------------------------------------- Gestational Age  LMP:           30w 4d        Date:  09/09/22                  EDD:   06/16/23  U/S Today:     28w 4d                                        EDD:   06/30/23  Best:          30w 1d     Det. ByMarcella Dubs         EDD:   06/19/23                                      (11/29/22) ---------------------------------------------------------------------- Anatomy  Cranium:                Appears normal         Aortic Arch:            Previously seen  Cavum:                 Previously seen        Ductal Arch:            Previously seen  Ventricles:            Previously seen        Diaphragm:              Previously seen  Choroid Plexus:        Previously seen        Stomach:  Appears normal, left                                                                        sided  Cerebellum:            Previously seen        Abdomen:                Appears normal  Posterior Fossa:       Previously seen        Abdominal Wall:         Previously seen  Nuchal Fold:           Previously seen        Cord Vessels:           Previously seen  Face:                  Orbits and profile     Kidneys:                Previously seen                         previously seen  Lips:                  Previously seen        Bladder:                Appears normal  Thoracic:              Previously seen        Spine:                  Previously seen  Heart:                 Previously seen        Upper Extremities:      Previously seen  RVOT:                  Previously seen        Lower Extremities:      Previously seen  LVOT:                  Previously seen  Other:  3VV, Hands and feet/heels previously visualized. Female gender          previously seen. Technically difficult due to maternal habitus and fetal          position. ---------------------------------------------------------------------- Doppler - Fetal Vessels  Umbilical Artery   S/D     %tile      RI    %tile      PI    %tile     PSV    ADFV    RDFV                                                     (cm/s)   2.91       55    0.66       61  0.98       56     53.1      No      No ---------------------------------------------------------------------- Comments  This patient was seen due to IUGR.  Her pregnancy has also  been complicated by pregestational diabetes, maternal  obesity, and chronic hypertension.  She denies any problems  since her last  exam and reports feeling vigorous fetal  movements throughout the day.  On today's exam, the EFW of 3 pounds 2 ounces measures  at the 21st percentile for her gestational age.  The fetus has  grown over 1 pound over the past 3 weeks.  The total AFI was 18.53 cm (within normal limits).  A BPP performed today was 8 out of 8.  The NST was unable  to be obtained today due to maternal body habitus and fetal  movements.  Doppler studies of the umbilical arteries showed a normal  S/D ratio of 2.91 .  There were no signs of absent or reversed  end-diastolic flow.  Due to her underlying medical conditions and borderline  IUGR, delivery is recommended at around 37 weeks.  She  should continue weekly fetal testing until delivery.  She will return in 1 week for another BPP and umbilical artery  Doppler study. ----------------------------------------------------------------------                   Ma Rings, MD Electronically Signed Final Report   04/11/2023 03:49 pm ----------------------------------------------------------------------   [place] MFM UA CORD DOPPLER  Result Date: 04/11/2023 ----------------------------------------------------------------------  OBSTETRICS REPORT                       (Signed Final 04/11/2023 03:49 pm) ---------------------------------------------------------------------- Patient Info  ID #:       [id number]                          D.O.B.:  [birth year] (37 yrs)  Name:       Monique Schmidt                Visit Date: 04/11/2023 02:44 pm ---------------------------------------------------------------------- Performed By  Attending:        Ma Rings MD         Ref. Address:     [address]                                                             [place], [place]                                                             [address]  Performed By:     Marcellina Millin       [facility]  Referred By:      [facility] ---------------------------------------------------------------------- Orders  #  Description                           Code        Ordered By  1  [place] MFM OB FOLLOW UP                   [phone]    RAVI SHANKAR  2  [place] MFM UA CORD DOPPLER                [id number]    RAVI SHANKAR  3  [place] MFM FETAL BPP                      95621.3     YU FANG     W/NONSTRESS ----------------------------------------------------------------------  #  Order #                     Accession #                Episode #  1  [id number]                   [id number]                 [id number]  2  [id number]                   [id number]                 [id number]  3  [id number]                   [id number]                 [id number] ---------------------------------------------------------------------- Indications  Obesity complicating pregnancy, third          O99.213  trimester (pregravid BMI 48)  Pre-existing diabetes, type 2, in pregnancy,   O24.113  third trimester  Abnormal chromosomal and genetic finding       O28.5  on antenatal screening of mother (HIGH  RISK due to fetal DNA fraction--> repeat  normal))  Hypertension - Chronic/Pre-existing            O10.019  (nifedipine)  Advanced maternal age multigravida 50+,        O35.523  third trimester  Grand multiparity, antepartum                  O09.40  Uterine fibroids affecting pregnancy in third  O34.13, D25.9  trimester,  antepartum  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 Cord Insertion:      Previously seen  Amniotic Fluid  AFI FV:      Within normal limits  AFI Sum(cm)     %Tile       Largest Pocket(cm)  18.53           70          5.47  RUQ(cm)       RLQ(cm)       LUQ(cm)        LLQ(cm)  5.47          4.64          3.33           5.09 ---------------------------------------------------------------------- Biophysical Evaluation  Amniotic F.V:   Pocket => 2 cm             F. Tone:        Observed  F. Movement:    Observed                   Score:          8/8  F. Breathing:   Observed ---------------------------------------------------------------------- Biometry  BPD:      66.8  mm     G. Age:  26w 6d        < 1  %    CI:        67.78   %    70 - 86                                                          FL/HC:      19.6   %    19.2 - 21.4  HC:      259.6  mm     G. Age:  28w 2d        < 1  %    HC/AC:      0.94        0.99 - 1.21  AC:      277.1  mm     G. Age:  31w 5d         88  %    FL/BPD:     76.0   %    71 - 87  FL:       50.8  mm     G. Age:  27w 2d        < 1  %    FL/AC:      18.3   %    20 -  24  Est. FW:    1426  gm      3 lb 2 oz     21  % ---------------------------------------------------------------------- OB History  Gravidity:    7         Term:   5 ---------------------------------------------------------------------- Gestational Age  LMP:           30w 4d        Date:  09/09/22                  EDD:   06/16/23  U/S Today:     28w 4d                                        EDD:   06/30/23  Best:          30w 1d     Det. ByMarcella Dubs         EDD:   06/19/23                                      (11/29/22) ---------------------------------------------------------------------- Anatomy  Cranium:               Appears normal         Aortic Arch:            Previously seen  Cavum:                 Previously  seen        Ductal Arch:            Previously seen  Ventricles:            Previously seen        Diaphragm:              Previously seen  Choroid Plexus:        Previously seen        Stomach:                Appears normal, left                                                                        sided  Cerebellum:            Previously seen        Abdomen:                Appears normal  Posterior Fossa:       Previously seen        Abdominal Wall:         Previously seen  Nuchal Fold:           Previously seen        Cord Vessels:           Previously seen  Face:                  Orbits and profile     Kidneys:  Previously seen                         previously seen  Lips:                  Previously seen        Bladder:                Appears normal  Thoracic:              Previously seen        Spine:                  Previously seen  Heart:                 Previously seen        Upper Extremities:      Previously seen  RVOT:                  Previously seen        Lower Extremities:      Previously seen  LVOT:                  Previously seen  Other:  3VV, Hands and feet/heels previously visualized. Female gender          previously seen. Technically difficult due to maternal habitus and fetal          position. ---------------------------------------------------------------------- Doppler - Fetal Vessels  Umbilical Artery   S/D     %tile      RI    %tile      PI    %tile     PSV    ADFV    RDFV                                                     (cm/s)   2.91       55    0.66       61    0.98       56     53.1      No      No ---------------------------------------------------------------------- Comments  This patient was seen due to IUGR.  Her pregnancy has also  been complicated by pregestational diabetes, maternal  obesity, and chronic hypertension.  She denies any problems  since her last exam and reports feeling vigorous fetal  movements throughout the day.  On today's exam, the EFW of 3  pounds 2 ounces measures  at the 21st percentile for her gestational age.  The fetus has  grown over 1 pound over the past 3 weeks.  The total AFI was 18.53 cm (within normal limits).  A BPP performed today was 8 out of 8.  The NST was unable  to be obtained today due to maternal body habitus and fetal  movements.  Doppler studies of the umbilical arteries showed a normal  S/D ratio of 2.91 .  There were no signs of absent or reversed  end-diastolic flow.  Due to her underlying medical conditions and borderline  IUGR, delivery is recommended at around 37 weeks.  She  should continue weekly fetal testing until delivery.  She will return in 1 week for another BPP and umbilical artery  Doppler study. ----------------------------------------------------------------------  Ma Rings, MD Electronically Signed Final Report   04/11/2023 03:49 pm ----------------------------------------------------------------------   [place] MFM FETAL BPP W/NONSTRESS  Result Date: 04/11/2023 ----------------------------------------------------------------------  OBSTETRICS REPORT                       (Signed Final 04/11/2023 03:49 pm) ---------------------------------------------------------------------- Patient Info  ID #:       [id number]                          D.O.B.:  [birth year] (37 yrs)  Name:       Monique Schmidt                Visit Date: 04/11/2023 02:44 pm ---------------------------------------------------------------------- Performed By  Attending:        Ma Rings MD         Ref. Address:     [address]                                                             [place], [place]                                                             [address]  Performed By:     Marcellina Millin       Location[facility]                                     [facility]  Referred By:      [facility]                    [facility] ---------------------------------------------------------------------- Orders  #  Description                           Code        Ordered By  1  [place] MFM OB FOLLOW UP                   [phone]    RAVI [facility]  2  [place] MFM UA CORD DOPPLER                76820.02    RAVI SHANKAR  3  [place] MFM FETAL BPP                      09811.9     Rosana Hoes     W/NONSTRESS ----------------------------------------------------------------------  #  Order #                     Accession #                Episode #  1  [id number]                   [id number]                 [id number]  2  [id number]                   [id number]                 [id number]  3  [id number]                   [id number]                 [id number] ---------------------------------------------------------------------- Indications  Obesity complicating pregnancy, third          O99.213  trimester (pregravid BMI 48)  Pre-existing diabetes, type 2, in pregnancy,   O24.113  third trimester  Abnormal chromosomal and genetic finding       O28.5  on antenatal screening of mother (HIGH  RISK due to fetal DNA fraction--> repeat  normal))  Hypertension - Chronic/Pre-existing            O10.019  (nifedipine)  Advanced maternal age multigravida 78+,        O74.523  third trimester  Grand multiparity, antepartum                  O09.40  Uterine fibroids affecting pregnancy in third  O34.13, D25.9  trimester, antepartum  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 Cord Insertion:      Previously seen  Amniotic Fluid  AFI FV:      Within normal limits  AFI Sum(cm)     %Tile       Largest Pocket(cm)  18.53           70          5.47  RUQ(cm)       RLQ(cm)       LUQ(cm)        LLQ(cm)  5.47          4.64  3.33           5.09 ---------------------------------------------------------------------- Biophysical Evaluation  Amniotic F.V:   Pocket => 2 cm             F. Tone:        Observed  F. Movement:    Observed                   Score:          8/8  F. Breathing:   Observed ---------------------------------------------------------------------- Biometry  BPD:      66.8  mm     G. Age:  26w 6d        < 1  %    CI:        67.78   %    70 - 86                                                          FL/HC:      19.6   %    19.2 - 21.4  HC:      259.6  mm     G. Age:  28w 2d        < 1  %    HC/AC:      0.94        0.99 - 1.21  AC:      277.1  mm     G. Age:  31w 5d         88  %    FL/BPD:     76.0   %    71 - 87  FL:       50.8  mm     G. Age:  27w 2d        < 1  %    FL/AC:      18.3   %    20 - 24  Est. FW:    1426  gm      3 lb 2 oz     21  % ---------------------------------------------------------------------- OB History  Gravidity:    7         Term:   5 ---------------------------------------------------------------------- Gestational Age  LMP:           30w 4d        Date:  09/09/22                  EDD:   06/16/23  U/S Today:     28w 4d                                        EDD:   06/30/23  Best:          30w 1d     Det. ByMarcella Dubs         EDD:   06/19/23                                      (11/29/22) ---------------------------------------------------------------------- Anatomy  Cranium:               Appears normal  Aortic Arch:            Previously seen  Cavum:                 Previously seen        Ductal Arch:            Previously seen  Ventricles:            Previously seen         Diaphragm:              Previously seen  Choroid Plexus:        Previously seen        Stomach:                Appears normal, left                                                                        sided  Cerebellum:            Previously seen        Abdomen:                Appears normal  Posterior Fossa:       Previously seen        Abdominal Wall:         Previously seen  Nuchal Fold:           Previously seen        Cord Vessels:           Previously seen  Face:                  Orbits and profile     Kidneys:                Previously seen                         previously seen  Lips:                  Previously seen        Bladder:                Appears normal  Thoracic:              Previously seen        Spine:                  Previously seen  Heart:                 Previously seen        Upper Extremities:      Previously seen  RVOT:                  Previously seen        Lower Extremities:      Previously seen  LVOT:                  Previously seen  Other:  3VV, Hands and feet/heels previously visualized. Female gender          previously seen. Technically difficult due to maternal habitus and fetal  position. ---------------------------------------------------------------------- Doppler - Fetal Vessels  Umbilical Artery   S/D     %tile      RI    %tile      PI    %tile     PSV    ADFV    RDFV                                                     (cm/s)   2.91       55    0.66       61    0.98       56     53.1      No      No ---------------------------------------------------------------------- Comments  This patient was seen due to IUGR.  Her pregnancy has also  been complicated by pregestational diabetes, maternal  obesity, and chronic hypertension.  She denies any problems  since her last exam and reports feeling vigorous fetal  movements throughout the day.  On today's exam, the EFW of 3 pounds 2 ounces measures  at the 21st percentile for her gestational age.  The fetus has  grown over  1 pound over the past 3 weeks.  The total AFI was 18.53 cm (within normal limits).  A BPP performed today was 8 out of 8.  The NST was unable  to be obtained today due to maternal body habitus and fetal  movements.  Doppler studies of the umbilical arteries showed a normal  S/D ratio of 2.91 .  There were no signs of absent or reversed  end-diastolic flow.  Due to her underlying medical conditions and borderline  IUGR, delivery is recommended at around 37 weeks.  She  should continue weekly fetal testing until delivery.  She will return in 1 week for another BPP and umbilical artery  Doppler study. ----------------------------------------------------------------------                   Ma Rings, MD Electronically Signed Final Report   04/11/2023 03:49 pm ----------------------------------------------------------------------   [place] MFM UA CORD DOPPLER  Result Date: 04/04/2023 ----------------------------------------------------------------------  OBSTETRICS REPORT                       (Signed Final 04/04/2023 12:23 pm) ---------------------------------------------------------------------- Patient Info  ID #:       [id number]                          D.O.B.:  10-03-1985 (37 yrs)  Name:       Monique Schmidt                Visit Date: 04/04/2023 11:58 am ---------------------------------------------------------------------- Performed By  Attending:        Braxton Feathers DO       Ref. Address:     [address]                                                             [place], [place]  [address]  Performed By:     Eden Lathe BS      [facility] RVT                                 [facility]  Referred By:      [facility]  ---------------------------------------------------------------------- Orders  #  Description                           Code        Ordered By  1  [place] MFM UA CORD DOPPLER                76820.02    RAVI [facility]  2  [place] MFM OB LIMITED                     [id number]    RAVI [facility] ----------------------------------------------------------------------  #  Order #                     Accession #                Episode #  1  [id number]                   [id number]                 [id number]  2  [id number]                   [id number]                 [id number] ---------------------------------------------------------------------- Indications  Obesity complicating pregnancy, third          O99.213  trimester (pregravid BMI 48)  Pre-existing diabetes, type 2, in pregnancy,   O24.113  third trimester  Abnormal chromosomal and genetic finding       O28.5  on antenatal screening of mother (HIGH  RISK due to fetal DNA fraction--> repeat  normal))  Hypertension - Chronic/Pre-existing            O10.019  (nifedipine)  Advanced maternal age multigravida 59+,        O37.523  third trimester  Grand multiparity, antepartum                  O09.40  Uterine fibroids affecting pregnancy in third  O34.13, D25.9  trimester, antepartum  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 Cord Insertion:      Previously seen  Amniotic Fluid  AFI FV:      Within normal limits  AFI Sum(cm)     %Tile       Largest Pocket(cm)  16.02           58          7.96  RUQ(cm)       RLQ(cm)       LUQ(cm)        LLQ(cm)  3.77          7.96          3.31            0.98 ---------------------------------------------------------------------- OB History  Gravidity:    7         Term:   5 ---------------------------------------------------------------------- Gestational Age  LMP:           29w 4d        Date:  09/09/22                  EDD:   06/16/23  Best:          29w 1d     Det. ByMarcella Dubs         EDD:   06/19/23                                      (11/29/22) ---------------------------------------------------------------------- Anatomy  Stomach:               Appears normal, left   Bladder:                Appears normal                         sided  Kidneys:               Appear normal ---------------------------------------------------------------------- Doppler - Fetal Vessels  Umbilical Artery   S/D     %tile      RI    %tile      PI    %tile     PSV    ADFV    RDFV                                                     (cm/s)   3.06       58    0.67       61    1.09  74    52.63      No      No ---------------------------------------------------------------------- Comments  The patient is here for a follow-up ultrasound for FGR at 29w  1d. EDD: 06/19/2023. Dating: Early Ultrasound  (11/29/22).  She has no concerns today.  Sonographic findings  Single intrauterine pregnancy.  Fetal cardiac activity:  Observed and appears normal.  Presentation: Transverse, head to maternal right.  Interval fetal anatomy appears normal.  Amniotic fluid volume: Within normal limits. AFI: 16.02 cm.  MVP: 7.96 cm.  Placenta: Posterior.  Umbilical artery dopplers findings:  -S/D:3.06 which are normal at this gestational age.  -Absent end-diastolic flow: No.  -Reversed end-diastolic flow:  No.  NST was reactive.  Recommendations  - Continue weekly UA dopplers and antenatal testing until  delivery.  - Serial growth [place] every 3 weeks until delivery.  - Delivery likely around 38 to 39 weeks or sooner if indicated.  There are limitations of prenatal ultrasound such as the   inability to detect certain abnormalities due to poor  visualization. Various factors such as fetal position,  gestational age and maternal body habitus may increase the  difficulty in visualizing the fetal anatomy. ----------------------------------------------------------------------                  Braxton Feathers, DO Electronically Signed Final Report   04/04/2023 12:23 pm ----------------------------------------------------------------------   [place] MFM OB LIMITED  Result Date: 04/04/2023 ----------------------------------------------------------------------  OBSTETRICS REPORT                       (Signed Final 04/04/2023 12:23 pm) ---------------------------------------------------------------------- Patient Info  ID #:       [id number]                          D.O.B.:  [birth year] (37 yrs)  Name:       Monique Schmidt                Visit Date: 04/04/2023 11:58 am ---------------------------------------------------------------------- Performed By  Attending:        Braxton Feathers DO       Ref. Address:     [address]                                                             [place], [place]                                                             [address]  Performed By:     Eden Lathe BS      [facility]  Referred By:      [facility] ---------------------------------------------------------------------- Orders  #  Description                           Code        Ordered By  1  [place] MFM UA CORD DOPPLER                [phone]    Noralee Space  2  [place] MFM OB LIMITED                     [id number]    RAVI SHANKAR ----------------------------------------------------------------------  #  Order #                     Accession #                 Episode #  1  [id number]                   [id number]                 [id number]  2  [id number]                   [id number]                 [id number] ---------------------------------------------------------------------- Indications  Obesity complicating pregnancy, third          O99.213  trimester (pregravid BMI 48)  Pre-existing diabetes, type 2, in pregnancy,   O24.113  third trimester  Abnormal chromosomal and genetic finding       O28.5  on antenatal screening of mother (HIGH  RISK due to fetal DNA fraction--> repeat  normal))  Hypertension - Chronic/Pre-existing            O10.019  (nifedipine)  Advanced maternal age multigravida 52+,        O37.523  third trimester  Grand multiparity, antepartum                  O09.40  Uterine fibroids affecting pregnancy in third  O34.13, D25.9  trimester, antepartum  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 Cord Insertion:      Previously seen  Amniotic Fluid  AFI FV:      Within  normal limits  AFI Sum(cm)     %Tile       Largest Pocket(cm)  16.02           58          7.96  RUQ(cm)       RLQ(cm)       LUQ(cm)        LLQ(cm)  3.77          7.96          3.31           0.98 ---------------------------------------------------------------------- OB History  Gravidity:    7         Term:   5 ---------------------------------------------------------------------- Gestational Age  LMP:           29w 4d        Date:  09/09/22                  EDD:   06/16/23  Best:          29w 1d     Det. ByMarcella Dubs         EDD:   06/19/23                                       (11/29/22) ---------------------------------------------------------------------- Anatomy  Stomach:               Appears normal, left   Bladder:                Appears normal                         sided  Kidneys:               Appear normal ---------------------------------------------------------------------- Doppler - Fetal Vessels  Umbilical Artery   S/D     %tile      RI    %tile      PI    %tile     PSV    ADFV    RDFV                                                     (cm/s)   3.06       58    0.67       61    1.09       74    52.63      No      No ---------------------------------------------------------------------- Comments  The patient is here for a follow-up ultrasound for FGR at 29w  1d. EDD: 06/19/2023. Dating: Early Ultrasound  (11/29/22).  She has no concerns today.  Sonographic findings  Single intrauterine pregnancy.  Fetal cardiac activity:  Observed and appears normal.  Presentation: Transverse, head to maternal right.  Interval fetal anatomy appears normal.  Amniotic fluid volume: Within normal limits. AFI: 16.02 cm.  MVP: 7.96 cm.  Placenta: Posterior.  Umbilical artery dopplers findings:  -S/D:3.06 which are normal at this gestational age.  -Absent end-diastolic flow: No.  -Reversed end-diastolic flow:  No.  NST was reactive.  Recommendations  - Continue weekly UA dopplers and antenatal testing until  delivery.  - Serial growth [place] every 3  weeks until delivery.  - Delivery likely around 38 to 39 weeks or sooner if indicated.  There are limitations of prenatal ultrasound such as the  inability to detect certain abnormalities due to poor  visualization. Various factors such as fetal position,  gestational age and maternal body habitus may increase the  difficulty in visualizing the fetal anatomy. ----------------------------------------------------------------------                  Braxton Feathers, DO Electronically Signed Final Report   04/04/2023 12:23 pm  ----------------------------------------------------------------------   [place] MFM UA CORD DOPPLER  Result Date: 03/28/2023 ----------------------------------------------------------------------  OBSTETRICS REPORT                       (Signed Final 03/28/2023 05:43 pm) ---------------------------------------------------------------------- Patient Info  ID #:       [id number]                          D.O.B.:  09-27-1985 (37 yrs)  Name:       Monique Schmidt                Visit Date: 03/28/2023 02:16 pm ---------------------------------------------------------------------- Performed By  Attending:        Lin Landsman      Ref. Address:     [address]                    [place]                                                             [place], [place]                                                             [address]  Performed By:     Alain Marion     Location[facility]                    RD[facility]                                     [facility]  Referred By:      [facility] ---------------------------------------------------------------------- Orders  #  Description                           Code        Ordered By  1  [place] MFM UA CORD DOPPLER                76820.02    RAVI SHANKAR  2  [place] MFM OB LIMITED                     [id number]    RAVI SHANKAR ----------------------------------------------------------------------  #  Order #                     Accession #                Episode #  1  [id number]                   [id number]                 [id number]  2  [id number]                   [id number]                 [id number] ---------------------------------------------------------------------- Indications  Obesity complicating pregnancy, second         O99.212  trimester (BMI 48)  Pre-existing diabetes, type 2, in pregnancy,    O24.112  second trimester  Abnormal chromosomal and genetic finding       O28.5  on antenatal screening of mother (HIGH  RISK due to fetal DNA fraction))  Pre-existing essential hypertension            O10.012  complicating pregnancy, second trimester  (nifedipine)  Advanced maternal age multigravida 87+,        O10.522  second trimester (37 yo)  Grand multiparity, antepartum                  O09.40  [redacted] weeks gestation of pregnancy                Z3A.28  Uterine fibroids affecting pregnancy in        O34.12, D25.9  second trimester, antepartum  Genetic carrier (SILENT CARRIER for Alpha-     Z14.8  Thalassemia)  Encounter for other antenatal screening        Z36.2  follow-up ---------------------------------------------------------------------- Fetal Evaluation  Num Of Fetuses:         1  Fetal Heart Rate(bpm):  154  Cardiac Activity:       Observed  Presentation:           Cephalic  Placenta:               Anterior  P. Cord Insertion:      Previously seen  AFI Sum(cm)     %Tile       Largest Pocket(cm)  17.47           66          6.04  RUQ(cm)       RLQ(cm)       LUQ(cm)        LLQ(cm)  2.37          6.04          4.71           4.35 ---------------------------------------------------------------------- OB History  Gravidity:    7         Term:   5 ---------------------------------------------------------------------- Gestational Age  LMP:           28w 4d        Date:  09/09/22                  EDD:   06/16/23  Best:          28w 1d     Det. ByMarcella Dubs         EDD:   06/19/23                                      (11/29/22) ---------------------------------------------------------------------- Doppler - Fetal Vessels  Umbilical Artery   S/D     %tile      RI    %tile      PI    %tile     PSV    ADFV    RDFV                                                     (cm/s)   3.04       52    0.67       56    1.04       58    50.15      No      No  ---------------------------------------------------------------------- Impression  Antenatal testing due to IUGR with an EFW 2.5%  NST reactive with good fetal movement and amniotic fluid  volume  UA Dopplers are normal with no evidence of AEDF or REDF ---------------------------------------------------------------------- Recommendations  Continue weekly testing and serial growth as previously  scheduled. ----------------------------------------------------------------------              Lin Landsman, MD Electronically Signed Final Report   03/28/2023 05:43 pm ----------------------------------------------------------------------   [place] MFM OB LIMITED  Result Date: 03/28/2023 ----------------------------------------------------------------------  OBSTETRICS REPORT                       (Signed Final 03/28/2023 05:43 pm) ---------------------------------------------------------------------- Patient Info  ID #:       [id number]                          D.O.B.:  May 31, 1986 (37 yrs)  Name:       Monique Schmidt                Visit Date: 03/28/2023 02:16 pm ---------------------------------------------------------------------- Performed By  Attending:        Lin Landsman      Ref. Address:     [address]                    [place]                                                             [place], [place]  [address]  Performed By:     Alain Marion     [facility]                    RD[facility]                                     [facility] at                                                             [facility]  Referred By:      [facility] ---------------------------------------------------------------------- Orders  #  Description                           Code        Ordered By  1  [place] MFM UA CORD DOPPLER                76820.02     RAVI [facility]  2  [place] MFM OB LIMITED                     [id number]    RAVI [facility] ----------------------------------------------------------------------  #  Order #                     Accession #                Episode #  1  [id number]                   [id number]                 [id number]  2  [id number]                   [id number]                 [id number] ---------------------------------------------------------------------- Indications  Obesity complicating pregnancy, second         O99.212  trimester (BMI 48)  Pre-existing diabetes, type 2, in pregnancy,   O24.112  second trimester  Abnormal chromosomal and genetic finding       O28.5  on antenatal screening of mother (HIGH  RISK due to fetal DNA fraction))  Pre-existing essential hypertension            O10.012  complicating pregnancy, second trimester  (nifedipine)  Advanced maternal age multigravida 75+,        O43.522  second trimester (37 yo)  Grand multiparity, antepartum                  O09.40  [redacted] weeks gestation of pregnancy  Z3A.28  Uterine fibroids affecting pregnancy in        O34.12, D25.9  second trimester, antepartum  Genetic carrier (SILENT CARRIER for Alpha-     Z14.8  Thalassemia)  Encounter for other antenatal screening        Z36.2  follow-up ---------------------------------------------------------------------- Fetal Evaluation  Num Of Fetuses:         1  Fetal Heart Rate(bpm):  154  Cardiac Activity:       Observed  Presentation:           Cephalic  Placenta:               Anterior  P. Cord Insertion:      Previously seen  AFI Sum(cm)     %Tile       Largest Pocket(cm)  17.47           66          6.04  RUQ(cm)       RLQ(cm)       LUQ(cm)        LLQ(cm)  2.37          6.04          4.71           4.35 ---------------------------------------------------------------------- OB History  Gravidity:    7         Term:   5 ---------------------------------------------------------------------- Gestational Age  LMP:           28w 4d         Date:  09/09/22                  EDD:   06/16/23  Best:          28w 1d     Det. ByMarcella Dubs         EDD:   06/19/23                                      (11/29/22) ---------------------------------------------------------------------- Doppler - Fetal Vessels  Umbilical Artery   S/D     %tile      RI    %tile      PI    %tile     PSV    ADFV    RDFV                                                     (cm/s)   3.04       52    0.67       56    1.04       58    50.15      No      No ---------------------------------------------------------------------- Impression  Antenatal testing due to IUGR with an EFW 2.5%  NST reactive with good fetal movement and amniotic fluid  volume  UA Dopplers are normal with no evidence of AEDF or REDF ---------------------------------------------------------------------- Recommendations  Continue weekly testing and serial growth as previously  scheduled. ----------------------------------------------------------------------              Lin Landsman, MD Electronically Signed Final Report   03/28/2023 05:43 pm ----------------------------------------------------------------------     Assessment and Plan: Patient Active Problem List  Diagnosis Date Noted   Uncontrolled diabetes mellitus with hyperglycemia (HCC) 04/26/2023   Trichomonal vaginitis during pregnancy in third trimester 04/14/2023   Benign gestational thrombocytopenia in third trimester (HCC) 04/11/2023   Fetal growth restriction antepartum 03/01/2023   T13/T18+ on Panorama 02/26/2023   Gonorrhea affecting pregnancy 01/22/2023   Uterine fibroid in pregnancy 12/20/2022   Chronic hypertension affecting pregnancy 12/12/2022   Type 2 diabetes mellitus affecting pregnancy, antepartum  insulin, oral 12/11/2022   Alpha thalassemia silent carrier 12/08/2022   Supervision of high risk pregnancy, antepartum 11/19/2022   AMA (advanced maternal age) multigravida 35+, unspecified trimester 11/19/2022   BMI  50.0-59.9, adult (HCC) 11/09/2013   Admit to Antenatal Started on Endotool as per DM Coordinator recommendations, with goal blood sugars of 90-120 mg/dL. Plan is to maintain IV insulin overnight and transition to SQ insulin tomorrow. Continue Procardia for CHTN. No signs or sum,ptoms of superimposed preeclampsia for now. Category 1 FHR tracing.  8/22 EFW 1426 g/21 %.  9/5 BPP 8/8, nml AFV, footling breech. Continue NST BID.  Routine antenatal care  Jaynie Collins, MD, FACOG Attending Obstetrician & Gynecologist Faculty Practice, [facility]

## 2023-04-26 NOTE — Inpatient Diabetes Management (Addendum)
Inpatient Diabetes Program Recommendations  ADA Standards of Care 2023 Diabetes in Pregnancy Target Glucose Ranges:  Fasting: 70 - 95 mg/dL 1 hr postprandial:  161 - 140mg /dL (from first bite of meal) 2 hr postprandial:  100 - 120 mg/dL (from first bit of meal)    Lab Results  Component Value Date   GLUCAP 180 (H) 04/26/2023   HGBA1C 9.5 (H) 04/11/2023    Review of Glycemic Control  Latest Reference Range & Units 04/26/23 15:42  Glucose-Capillary 70 - 99 mg/dL 096 (H)   Diabetes history: Type 2 DM/ in pregnancy Outpatient Diabetes medications:  Prior to starting insulin pump patient was taking NPH 33 units bid plus Novolog 10 units tid meal coverage (per documentation on 03/14/23)  Omnipod DASH insulin pump (started pump on 03/20/23) Basal rate- 1.5 units/ hr CHO ratio - 1 unit/5 grams of CHO Sensitivity=25 with target of 90 mg/dL  Current orders for Inpatient glycemic control:  IV insulin  Inpatient Diabetes Program Recommendations:    Note patient admitted and will be placed on IV insulin using Endotool with goal blood sugars of 90-120 mg/dL.  Plan is to maintain IV insulin overnight and transition to SQ insulin tomorrow (04-27-23).   DM coordinator will review patient remotely tomorrow for recommendations.   Thanks,  Beryl Meager, RN, BC-ADM Inpatient Diabetes Coordinator Pager [phone]  (8a-5p)

## 2023-04-27 DIAGNOSIS — O368320 Maternal care for abnormalities of the fetal heart rate or rhythm, second trimester, not applicable or unspecified: Secondary | ICD-10-CM | POA: Diagnosis not present

## 2023-04-27 LAB — GLUCOSE, CAPILLARY
Glucose-Capillary: 177 mg/dL — ABNORMAL HIGH (ref 70–99)
Glucose-Capillary: 183 mg/dL — ABNORMAL HIGH (ref 70–99)

## 2023-04-27 LAB — CBC
HCT: 35.1 % — ABNORMAL LOW (ref 36.0–46.0)
Hemoglobin: 11.7 g/dL — ABNORMAL LOW (ref 12.0–15.0)
MCH: 27.1 pg (ref 26.0–34.0)
MCHC: 33.3 g/dL (ref 30.0–36.0)
MCV: 81.3 fL (ref 80.0–100.0)
Platelets: 101 10*3/uL — ABNORMAL LOW (ref 150–400)
RBC: 4.32 MIL/uL (ref 3.87–5.11)
RDW: 14.8 % (ref 11.5–15.5)
WBC: 7.9 10*3/uL (ref 4.0–10.5)
nRBC: 0 % (ref 0.0–0.2)

## 2023-04-27 LAB — BASIC METABOLIC PANEL WITH GFR
Anion gap: 9 (ref 5–15)
BUN: 7 mg/dL (ref 6–20)
CO2: 22 mmol/L (ref 22–32)
Calcium: 8.3 mg/dL — ABNORMAL LOW (ref 8.9–10.3)
Chloride: 103 mmol/L (ref 98–111)
Creatinine, Ser: 0.45 mg/dL (ref 0.44–1.00)
GFR, Estimated: >60 mL/min
Glucose, Bld: 102 mg/dL — ABNORMAL HIGH (ref 70–99)
Potassium: 3.1 mmol/L — ABNORMAL LOW (ref 3.5–5.1)
Sodium: 134 mmol/L — ABNORMAL LOW (ref 135–145)

## 2023-04-27 MED ORDER — INSULIN ASPART 100 UNIT/ML IJ SOLN: 0.0000 [IU] | Freq: Three times a day (TID) | INTRAMUSCULAR | Status: DC

## 2023-04-27 MED ORDER — INSULIN NPH (HUMAN) (ISOPHANE) 100 UNIT/ML ~~LOC~~ SUSP
40.0000 [IU] | Freq: Two times a day (BID) | SUBCUTANEOUS | Status: DC
  Administered 2023-04-27 – 2023-04-28 (×3): 40 [IU] via SUBCUTANEOUS
  Filled 2023-04-27: qty 10

## 2023-04-27 MED ORDER — INSULIN ASPART 100 UNIT/ML IJ SOLN
15.0000 [IU] | Freq: Three times a day (TID) | INTRAMUSCULAR | Status: DC
  Administered 2023-04-27 – 2023-04-28 (×2): 15 [IU] via SUBCUTANEOUS

## 2023-04-27 MED ORDER — INSULIN ASPART 100 UNIT/ML IJ SOLN
0.0000 [IU] | Freq: Three times a day (TID) | INTRAMUSCULAR | Status: DC
  Administered 2023-04-27 (×2): 4 [IU] via SUBCUTANEOUS
  Administered 2023-04-28: 3 [IU] via SUBCUTANEOUS
  Administered 2023-04-28 – 2023-04-29 (×3): 4 [IU] via SUBCUTANEOUS
  Administered 2023-04-29: 3 [IU] via SUBCUTANEOUS

## 2023-04-27 NOTE — Inpatient Diabetes Management (Signed)
Inpatient Diabetes Program Recommendations  AACE/ADA: New Consensus Statement on Inpatient Glycemic Control (2015)  Target Ranges:  Prepandial:   less than 140 mg/dL      Peak postprandial:   less than 180 mg/dL (1-2 hours)      Critically ill patients:  140 - 180 mg/dL   Lab Results  Component Value Date   GLUCAP 168 (H) 04/26/2023   HGBA1C 9.5 (H) 04/11/2023    Review of Glycemic Control  Diabetes history: Type 2 DM/ in pregnancy Outpatient Diabetes medications:  Prior to starting insulin pump patient was taking NPH 33 units bid plus Novolog 10 units tid meal coverage (per documentation on 03/14/23) Current: IV insulin (glucose trends not showing up on flowsheet but are in Endotool; she is wearing a CGM)  Inpatient Diabetes Program Recommendations:    When MD is ready to transition to SQ insulin, please consider:  NPH 40 units BID (give first dose 2 hrs prior to discontinuing IV insulin) Novolog 15 units TID with meals Novolog 0-14 units TID 2 hrs after meals  Per RN, patient is wearing a CGM.  Please wrench in CGM in flowsheet and manually enter glucose so it shows up on our flowsheet.  Will continue to follow while inpatient.  Thank you, Dulce Sellar, MSN, CDCES Diabetes Coordinator Inpatient Diabetes Program [phone] (team pager from 8a-5p)
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
## 2023-04-28 DIAGNOSIS — O368320 Maternal care for abnormalities of the fetal heart rate or rhythm, second trimester, not applicable or unspecified: Secondary | ICD-10-CM | POA: Diagnosis not present

## 2023-04-28 DIAGNOSIS — E1165 Type 2 diabetes mellitus with hyperglycemia: Secondary | ICD-10-CM | POA: Diagnosis not present

## 2023-04-28 DIAGNOSIS — Z3A32 32 weeks gestation of pregnancy: Secondary | ICD-10-CM | POA: Diagnosis not present

## 2023-04-28 LAB — BASIC METABOLIC PANEL
Anion gap: 9 (ref 5–15)
BUN: 6 mg/dL (ref 6–20)
CO2: 24 mmol/L (ref 22–32)
Calcium: 8.4 mg/dL — ABNORMAL LOW (ref 8.9–10.3)
Chloride: 102 mmol/L (ref 98–111)
Creatinine, Ser: 0.51 mg/dL (ref 0.44–1.00)
GFR, Estimated: >60 mL/min (ref >60)
Glucose, Bld: 128 mg/dL — ABNORMAL HIGH (ref 70–99)
Potassium: 3.3 mmol/L — ABNORMAL LOW (ref 3.5–5.1)
Sodium: 135 mmol/L (ref 135–145)

## 2023-04-28 LAB — GLUCOSE, CAPILLARY
Glucose-Capillary: 117 mg/dL — ABNORMAL HIGH (ref 70–99)
Glucose-Capillary: 125 mg/dL — ABNORMAL HIGH (ref 70–99)

## 2023-04-28 MED ORDER — INSULIN ASPART 100 UNIT/ML IJ SOLN
20.0000 [IU] | Freq: Three times a day (TID) | INTRAMUSCULAR | Status: DC
  Administered 2023-04-28 – 2023-04-29 (×4): 20 [IU] via SUBCUTANEOUS

## 2023-04-28 MED ORDER — INSULIN NPH (HUMAN) (ISOPHANE) 100 UNIT/ML ~~LOC~~ SUSP
6.0000 [IU] | Freq: Once | SUBCUTANEOUS | Status: AC
  Administered 2023-04-28: 6 [IU] via SUBCUTANEOUS

## 2023-04-28 MED ORDER — LACTATED RINGERS IV BOLUS
500.0000 mL | Freq: Once | INTRAVENOUS | Status: AC
  Administered 2023-04-28: 500 mL via INTRAVENOUS

## 2023-04-28 MED ORDER — INSULIN NPH (HUMAN) (ISOPHANE) 100 UNIT/ML ~~LOC~~ SUSP
46.0000 [IU] | Freq: Two times a day (BID) | SUBCUTANEOUS | Status: DC
  Administered 2023-04-28 – 2023-04-29 (×2): 46 [IU] via SUBCUTANEOUS
  Filled 2023-04-28: qty 10

## 2023-04-28 MED ORDER — SODIUM CHLORIDE 0.9% FLUSH
3.0000 mL | Freq: Two times a day (BID) | INTRAVENOUS | Status: DC
  Administered 2023-04-28 (×2): 3 mL via INTRAVENOUS

## 2023-04-28 NOTE — Progress Notes (Signed)
Patient ID: Monique Schmidt, female   DOB: 08-03-1986, 37 y.o.   MRN: [id number] FACULTY PRACTICE ANTEPARTUM(COMPREHENSIVE) NOTE  YAELIS PEPI is a 37 y.o. [id number] with Estimated Date of Delivery: 06/19/23   By  early ultrasound [redacted]w[redacted]d  who is admitted for diabetic patterning.    Fetal presentation is unsure. Length of Stay:  2  Days  Date of admission:04/26/2023  Subjective: No complaints, she reports good FM Patient reports the fetal movement as active. Patient reports uterine contraction  activity as none. Patient reports  vaginal bleeding as none. Patient describes fluid per vagina as None.  Vitals:  Blood pressure (!) 134/93, pulse 85, temperature 98 F (36.7 C), temperature source Oral, resp. rate 18, height 5\' 6"  (1.676 m), weight (!) 142.7 kg, last menstrual period 09/09/2022, SpO2 98%. Vitals:   04/27/23 2001 04/27/23 2328 04/28/23 0423 04/28/23 0737  BP: (!) 147/93 137/69 130/66 (!) 134/93  Pulse: 90 84 89 85  Resp: 18 17 16 18   Temp: 98.8 F (37.1 C) 98.6 F (37 C) 98.5 F (36.9 C) 98 F (36.7 C)  TempSrc: Oral Oral Oral Oral  SpO2: 100% 99% 96% 98%  Weight:      Height:       Physical Examination:  General appearance - alert, well appearing, and in no distress Abdomen - soft, nontender, nondistended, no masses or organomegaly Fundal Height:  size equals dates Pelvic Exam:  examination not indicated Cervical Exam: Not evaluated. Extremities: extremities normal, atraumatic, no cyanosis or edema with DTRs 2+ bilaterally Membranes:intact  Fetal Monitoring:  Baseline: 140s bpm, Variability: Good {> 6 bpm), Accelerations: not yet reactive this am but continues on the monitor, and Decelerations: had 1 variable when on back    equivocal -->still on the monitor  Labs:  Results for orders placed or performed during the hospital encounter of 04/26/23 (from the past 24 hour(s))  Glucose, capillary   Collection Time: 04/27/23  8:35 PM  Result Value Ref Range    Glucose-Capillary 183 (H) 70 - 99 mg/dL   Comment 1 Notify RN   Basic metabolic panel   Collection Time: 04/28/23  4:21 AM  Result Value Ref Range   Sodium 135 135 - 145 mmol/L   Potassium 3.3 (L) 3.5 - 5.1 mmol/L   Chloride 102 98 - 111 mmol/L   CO2 24 22 - 32 mmol/L   Glucose, Bld 128 (H) 70 - 99 mg/dL   BUN 6 6 - 20 mg/dL   Creatinine, Ser 2.95 0.44 - 1.00 mg/dL   Calcium 8.4 (L) 8.9 - 10.3 mg/dL   GFR, Estimated >62 >13 mL/min   Anion gap 9 5 - 15  Glucose, capillary   Collection Time: 04/28/23  7:35 AM  Result Value Ref Range   Glucose-Capillary 117 (H) 70 - 99 mg/dL    Imaging Studies:    [place] MFM FETAL BPP WO NON STRESS  Result Date: 04/25/2023 ----------------------------------------------------------------------  OBSTETRICS REPORT                       (Signed Final 04/25/2023 05:06 pm) ---------------------------------------------------------------------- Patient Info  ID #:       [id number]                          D.O.B.:  03/04/1986 (37 yrs)  Name:       Monique Schmidt  Visit Date: 04/25/2023 03:48 pm ---------------------------------------------------------------------- Performed By  Attending:        Noralee Space MD        Ref. Address:     [address]                                                             [place], [place]                                                             [address]  Performed By:     Eden Lathe BS      [facility]                    RD[facility] RVT                                 [facility]  Referred By:      [facility] ---------------------------------------------------------------------- Orders  #  Description                           Code        Ordered By  1  [place] MFM FETAL BPP WO NON               76819.01    BURK SCHAIBLE     STRESS  2  [place]  MFM UA CORD DOPPLER                76820.02    [facility] ----------------------------------------------------------------------  #  Order #                     Accession #                Episode #  1  [id number]                   [id number]                 [id number]  2  [id number]  [id number]                 [id number] ---------------------------------------------------------------------- Indications  Obesity complicating pregnancy, third          O99.213  trimester (pregravid BMI 48)  Pre-existing diabetes, type 2, in pregnancy,   O24.113  third trimester  Abnormal chromosomal and genetic finding       O28.5  on antenatal screening of mother (HIGH  RISK due to fetal DNA fraction--> repeat  normal))  Hypertension - Chronic/Pre-existing            O10.019  (nifedipine)  Advanced maternal age multigravida 63+,        O24.523  third trimester  Grand multiparity, antepartum                  O09.40  Uterine fibroids affecting pregnancy in third  O34.13, D25.9  trimester, antepartum  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 Cord Insertion:      Previously seen  Amniotic Fluid  AFI FV:      Within normal limits  AFI Sum(cm)     %Tile       Largest Pocket(cm)  16.8            61          6.47  RUQ(cm)       RLQ(cm)       LUQ(cm)        LLQ(cm)  2.89          4.31          3.13           6.47 ---------------------------------------------------------------------- Biophysical Evaluation  Amniotic F.V:   Within normal limits       F. Tone:        Observed  F. Movement:    Observed                   Score:           8/8  F. Breathing:   Observed ---------------------------------------------------------------------- Biometry  LV:        5.8  mm ---------------------------------------------------------------------- OB History  Gravidity:    7         Term:   5 ---------------------------------------------------------------------- Gestational Age  LMP:           32w 4d        Date:  09/09/22                  EDD:   06/16/23  Best:          32w 1d     Det. ByMarcella Dubs         EDD:   06/19/23                                      (  11/29/22) ---------------------------------------------------------------------- Anatomy  Ventricles:            Appears normal         Kidneys:                Appear normal  Stomach:               Appears normal, left   Bladder:                Appears normal                         sided ---------------------------------------------------------------------- Doppler - Fetal Vessels  Umbilical Artery   S/D     %tile      RI    %tile      PI    %tile     PSV    ADFV    RDFV                                                     (cm/s)   2.81       59    0.64       63    1.01       72    41.43      No      No ---------------------------------------------------------------------- Impression  Amniotic fluid is normal and good fetal activity is seen  .Antenatal testing is reassuring. BPP 8/8.  Footling breech  presentation.  Patient reports her fasting levels are in the 170s and  postprandial levels are above 200 mg/dL.  She is on  OmniPod and Dexcom.  Chronic hypertension.  Blood pressure today at our office is  102/69 mmHg.  She takes nifedipine.  I counseled the patient that poorly controlled diabetes is  associated with increased risk of stillbirth.  I recommended  inpatient management to control diabetes.  Patient will discuss with the family and may consider  admission on Monday (04/29/2023). ---------------------------------------------------------------------- Recommendations  -Patient is  considering admission. ----------------------------------------------------------------------                 Noralee Space, MD Electronically Signed Final Report   04/25/2023 05:06 pm ----------------------------------------------------------------------   [place] MFM UA CORD DOPPLER  Result Date: 04/25/2023 ----------------------------------------------------------------------  OBSTETRICS REPORT                       (Signed Final 04/25/2023 05:06 pm) ---------------------------------------------------------------------- Patient Info  ID #:       [id number]                          D.O.B.:  1985/10/19 (37 yrs)  Name:       JAZMINE PETROSIAN                Visit Date: 04/25/2023 03:48 pm ---------------------------------------------------------------------- Performed By  Attending:        Noralee Space MD        Ref. Address:     [address]  [place], [place]                                                             [address]  Performed By:     Eden Lathe BS      Location:         [facility]                    RDMS RVT                                 [facility]  Referred By:      [facility] ---------------------------------------------------------------------- Orders  #  Description                           Code        Ordered By  1  [place] MFM FETAL BPP WO NON               76819.01    BURK SCHAIBLE     STRESS  2  [place] MFM UA CORD DOPPLER                76820.02    [facility] ----------------------------------------------------------------------  #  Order #                     Accession #                Episode #  1  [id number]                   [id number]                 [id number]  2  [id number]                   [id number]                 [id number]  ---------------------------------------------------------------------- Indications  Obesity complicating pregnancy, third          O99.213  trimester (pregravid BMI 48)  Pre-existing diabetes, type 2, in pregnancy,   O24.113  third trimester  Abnormal chromosomal and genetic finding       O28.5  on antenatal screening of mother (HIGH  RISK due to fetal DNA fraction--> repeat  normal))  Hypertension - Chronic/Pre-existing  O10.019  (nifedipine)  Advanced maternal age multigravida 55+,        O20.523  third trimester  Grand multiparity, antepartum                  O09.40  Uterine fibroids affecting pregnancy in third  O34.13, D25.9  trimester, antepartum  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 Cord Insertion:      Previously seen  Amniotic Fluid  AFI FV:      Within normal limits  AFI Sum(cm)     %Tile       Largest Pocket(cm)  16.8            61          6.47  RUQ(cm)       RLQ(cm)       LUQ(cm)        LLQ(cm)  2.89          4.31          3.13           6.47 ---------------------------------------------------------------------- Biophysical Evaluation  Amniotic F.V:   Within normal limits       F. Tone:        Observed  F. Movement:    Observed                   Score:          8/8  F. Breathing:   Observed ---------------------------------------------------------------------- Biometry  LV:        5.8  mm ---------------------------------------------------------------------- OB History  Gravidity:    7         Term:   5 ---------------------------------------------------------------------- Gestational Age   LMP:           32w 4d        Date:  09/09/22                  EDD:   06/16/23  Best:          32w 1d     Det. ByMarcella Dubs         EDD:   06/19/23                                      (11/29/22) ---------------------------------------------------------------------- Anatomy  Ventricles:            Appears normal         Kidneys:                Appear normal  Stomach:               Appears normal, left   Bladder:                Appears normal  sided ---------------------------------------------------------------------- Doppler - Fetal Vessels  Umbilical Artery   S/D     %tile      RI    %tile      PI    %tile     PSV    ADFV    RDFV                                                     (cm/s)   2.81       59    0.64       63    1.01       72    41.43      No      No ---------------------------------------------------------------------- Impression  Amniotic fluid is normal and good fetal activity is seen  .Antenatal testing is reassuring. BPP 8/8.  Footling breech  presentation.  Patient reports her fasting levels are in the 170s and  postprandial levels are above 200 mg/dL.  She is on  OmniPod and Dexcom.  Chronic hypertension.  Blood pressure today at our office is  102/69 mmHg.  She takes nifedipine.  I counseled the patient that poorly controlled diabetes is  associated with increased risk of stillbirth.  I recommended  inpatient management to control diabetes.  Patient will discuss with the family and may consider  admission on Monday (04/29/2023). ---------------------------------------------------------------------- Recommendations  -Patient is considering admission. ----------------------------------------------------------------------                 Noralee Space, MD Electronically Signed Final Report   04/25/2023 05:06 pm ----------------------------------------------------------------------     Medications:  Scheduled  aspirin EC  162 mg Oral Daily   docusate sodium  100 mg  Oral Daily   insulin aspart  0-20 Units Subcutaneous TID PC   insulin aspart  20 Units Subcutaneous TID WC   insulin NPH Human  46 Units Subcutaneous BID AC & HS   NIFEdipine  30 mg Oral Daily   prenatal multivitamin  1 tablet Oral Q1200   sodium chloride flush  3 mL Intravenous Q12H   I have reviewed the patient's current medications.  ASSESSMENT: [id number] [redacted]w[redacted]d Estimated Date of Delivery: 06/19/23  Patient Active Problem List   Diagnosis Date Noted   Uncontrolled diabetes mellitus with hyperglycemia (HCC) 04/26/2023   Trichomonal vaginitis during pregnancy in third trimester 04/14/2023   Benign gestational thrombocytopenia in third trimester (HCC) 04/11/2023   Fetal growth restriction antepartum 03/01/2023   T13/T18+ on Panorama 02/26/2023   Gonorrhea affecting pregnancy 01/22/2023   Uterine fibroid in pregnancy 12/20/2022   Chronic hypertension affecting pregnancy 12/12/2022   Type 2 diabetes mellitus affecting pregnancy, antepartum  insulin, oral 12/11/2022   Alpha thalassemia silent carrier 12/08/2022   Supervision of high risk pregnancy, antepartum 11/19/2022   AMA (advanced maternal age) multigravida 35+, unspecified trimester 11/19/2022   BMI 50.0-59.9, adult (HCC) 11/09/2013    PLAN: >continue to pattern with her insulin, still sub optimal control but much better>^46 u NPH + ^20  u novolog with meals +ss >Continue nifedipine xl 30mg   Anticipate discharge tomorrow  Lazaro Arms 04/28/2023,10:45 AM

## 2023-04-29 ENCOUNTER — Inpatient Hospital Stay (HOSPITAL_COMMUNITY): Payer: Medicaid Other

## 2023-04-29 ENCOUNTER — Telehealth: Payer: Self-pay | Admitting: Dietician

## 2023-04-29 DIAGNOSIS — O24113 Pre-existing diabetes mellitus, type 2, in pregnancy, third trimester: Secondary | ICD-10-CM

## 2023-04-29 DIAGNOSIS — O09523 Supervision of elderly multigravida, third trimester: Secondary | ICD-10-CM | POA: Diagnosis not present

## 2023-04-29 DIAGNOSIS — Z3A32 32 weeks gestation of pregnancy: Secondary | ICD-10-CM

## 2023-04-29 DIAGNOSIS — E669 Obesity, unspecified: Secondary | ICD-10-CM | POA: Diagnosis not present

## 2023-04-29 DIAGNOSIS — O99213 Obesity complicating pregnancy, third trimester: Secondary | ICD-10-CM | POA: Diagnosis not present

## 2023-04-29 DIAGNOSIS — E1165 Type 2 diabetes mellitus with hyperglycemia: Secondary | ICD-10-CM | POA: Diagnosis not present

## 2023-04-29 DIAGNOSIS — E1169 Type 2 diabetes mellitus with other specified complication: Secondary | ICD-10-CM

## 2023-04-29 DIAGNOSIS — O368320 Maternal care for abnormalities of the fetal heart rate or rhythm, second trimester, not applicable or unspecified: Secondary | ICD-10-CM | POA: Diagnosis not present

## 2023-04-29 LAB — BASIC METABOLIC PANEL
Anion gap: 12 (ref 5–15)
BUN: 10 mg/dL (ref 6–20)
CO2: 21 mmol/L — ABNORMAL LOW (ref 22–32)
Calcium: 8.9 mg/dL (ref 8.9–10.3)
Chloride: 103 mmol/L (ref 98–111)
Creatinine, Ser: 0.52 mg/dL (ref 0.44–1.00)
GFR, Estimated: >60 mL/min (ref >60)
Glucose, Bld: 98 mg/dL (ref 70–99)
Potassium: 3.1 mmol/L — ABNORMAL LOW (ref 3.5–5.1)
Sodium: 136 mmol/L (ref 135–145)

## 2023-04-29 LAB — GLUCOSE, CAPILLARY: Glucose-Capillary: 98 mg/dL (ref 70–99)

## 2023-04-29 MED ORDER — INSULIN NPH (HUMAN) (ISOPHANE) 100 UNIT/ML ~~LOC~~ SUSP: 46.0000 [IU] | Freq: Two times a day (BID) | SUBCUTANEOUS | 11 refills | Status: DC

## 2023-04-29 MED ORDER — NOVOLOG FLEXPEN 100 UNIT/ML ~~LOC~~ SOPN: 20.0000 [IU] | PEN_INJECTOR | Freq: Three times a day (TID) | SUBCUTANEOUS | 11 refills | Status: DC

## 2023-04-29 NOTE — Progress Notes (Signed)
FACULTY PRACTICE ANTEPARTUM PROGRESS NOTE  Monique Schmidt is a 37 y.o. A6T0160 at [redacted]w[redacted]d who is admitted for diabetic patterning.  Estimated Date of Delivery: 06/19/23 Fetal presentation is  footling breech .  Length of Stay:  3 Days. Admitted 04/26/2023  Subjective: Pt seen and is doing well.  No acute complaints. Patient reports normal fetal movement.  She denies uterine contractions, denies bleeding and leaking of fluid per vagina.  Vitals:  Blood pressure 110/72, pulse 88, temperature 98.2 F (36.8 C), temperature source Oral, resp. rate 18, height 5\' 6"  (1.676 m), weight (!) 142.7 kg, last menstrual period 09/09/2022, SpO2 97%. Physical Examination: CONSTITUTIONAL: Well-developed, morbidly obese, well-nourished female in no acute distress.  HENT:  Normocephalic, atraumatic, External right and left ear normal. Oropharynx is clear and moist EYES: Conjunctivae and EOM are normal.  NECK: Normal range of motion, supple, no masses. SKIN: Skin is warm and dry. No rash noted. Not diaphoretic. No erythema. No pallor. Multiple acne like lesions on face NEUROLGIC: Alert and oriented to person, place, and time. Normal reflexes, muscle tone coordination. No cranial nerve deficit noted. PSYCHIATRIC: Normal mood and affect. Normal behavior. Normal judgment and thought content. CARDIOVASCULAR: Normal heart rate noted, regular rhythm RESPIRATORY: Effort and breath sounds normal, no problems with respiration noted MUSCULOSKELETAL: Normal range of motion. No edema and no tenderness. ABDOMEN: Soft, nontender, nondistended, gravid. CERVIX: deferred  Fetal monitoring: FHR: 13os bpm, Variability: moderate, Accelerations: Present, Decelerations: Absent  Uterine activity: irritability  Results for orders placed or performed during the hospital encounter of 04/26/23 (from the past 48 hour(s))  Glucose, capillary     Status: Abnormal   Collection Time: 04/27/23  8:35 PM  Result Value Ref Range    Glucose-Capillary 183 (H) 70 - 99 mg/dL    Comment: Glucose reference range applies only to samples taken after fasting for at least 8 hours.   Comment 1 Notify RN   Basic metabolic panel     Status: Abnormal   Collection Time: 04/28/23  4:21 AM  Result Value Ref Range   Sodium 135 135 - 145 mmol/L   Potassium 3.3 (L) 3.5 - 5.1 mmol/L   Chloride 102 98 - 111 mmol/L   CO2 24 22 - 32 mmol/L   Glucose, Bld 128 (H) 70 - 99 mg/dL    Comment: Glucose reference range applies only to samples taken after fasting for at least 8 hours.   BUN 6 6 - 20 mg/dL   Creatinine, Ser 1.09 0.44 - 1.00 mg/dL   Calcium 8.4 (L) 8.9 - 10.3 mg/dL   GFR, Estimated >32 >35 mL/min    Comment: (NOTE) Calculated using the CKD-EPI Creatinine Equation (2021)    Anion gap 9 5 - 15    Comment: Performed at [facility], [address] [place], [place] [address]  Glucose, capillary     Status: Abnormal   Collection Time: 04/28/23  7:35 AM  Result Value Ref Range   Glucose-Capillary 117 (H) 70 - 99 mg/dL    Comment: Glucose reference range applies only to samples taken after fasting for at least 8 hours.  Glucose, capillary     Status: Abnormal   Collection Time: 04/28/23  9:19 PM  Result Value Ref Range   Glucose-Capillary 125 (H) 70 - 99 mg/dL    Comment: Glucose reference range applies only to samples taken after fasting for at least 8 hours.  Basic metabolic panel     Status: Abnormal   Collection Time:  04/29/23  5:15 AM  Result Value Ref Range   Sodium 136 135 - 145 mmol/L   Potassium 3.1 (L) 3.5 - 5.1 mmol/L   Chloride 103 98 - 111 mmol/L   CO2 21 (L) 22 - 32 mmol/L   Glucose, Bld 98 70 - 99 mg/dL    Comment: Glucose reference range applies only to samples taken after fasting for at least 8 hours.   BUN 10 6 - 20 mg/dL   Creatinine, Ser 2.13 0.44 - 1.00 mg/dL   Calcium 8.9 8.9 - 08.6 mg/dL   GFR, Estimated >57 >84 mL/min    Comment: (NOTE) Calculated using the CKD-EPI Creatinine Equation (2021)     Anion gap 12 5 - 15    Comment: Performed at [facility], [address], [place], [place] [address]  Glucose, capillary     Status: None   Collection Time: 04/29/23  7:46 AM  Result Value Ref Range   Glucose-Capillary 98 70 - 99 mg/dL    Comment: Glucose reference range applies only to samples taken after fasting for at least 8 hours.  Fasting blood sugar is 98, postprandial blood sugar was 125 last night  I have reviewed the patient's current medications.  ASSESSMENT: Principal Problem:   Uncontrolled diabetes mellitus with hyperglycemia (HCC) Active Problems:   BMI 50.0-59.9, adult (HCC)   Supervision of high risk pregnancy, antepartum   AMA (advanced maternal age) multigravida 35+, unspecified trimester   Type 2 diabetes mellitus affecting pregnancy, antepartum  insulin, oral   Chronic hypertension affecting pregnancy   Fetal growth restriction antepartum   PLAN:  Type 2 diabetes:  NPH now at 46 units BID with 20 units regular insulin at meals.  Much improved over previous.  Pt has MFM scan on 9/12.Can attempt discharge today with close follow up this week. Chronic HTN: well controlled with procardia FGR: MFM growth scan later this week, continue weekly BPP  Anticipate discharge home today  Continue routine antenatal care.   Mariel Aloe, MD [facility] Faculty Attending, [facility] 04/29/2023 11:13 AM

## 2023-04-29 NOTE — Telephone Encounter (Signed)
Called patient. She is currently in the hospital since 04/26/2023 due to hyperglycemia. The insulin pump has been discontinued due to poor blood glucose control. Patient states that she is now taking 46 units of NPH with breakfast and dinner and 20 units of  insulin aspart before meals. She states that her insurance currently is not wanting to cover the NPH and staff is working on this problem. She states that she may be discharged today.  Instructed patient to keep her appointment with this RD this week. Challenges include patient being out of CGM and Omnipod supplies at times.  Oran Rein, RD, LDN, CDCES

## 2023-04-29 NOTE — Discharge Summary (Signed)
Antenatal Physician Discharge Summary  Patient ID: Monique Schmidt MRN: [id number] DOB/AGE: 02/19/1986 37 y.o.  Admit date: 04/26/2023 Discharge date: 04/29/2023  Admission Diagnoses:  Discharge Diagnoses:   Prenatal Procedures: NST and BPP  Consults: Maternal Fetal Medicine (curbside), Inpatient diabetes management  Hospital Course:  Monique Schmidt is a 37 y.o. [phone] with IUP at [redacted]w[redacted]d admitted for inpatient management of uncontrolled diabetes. With the help of inpatient diabetes management,  insulin dosages were adjusted and blood sugars were seen to greatly improve.  NST on 04/29/23 was category 1 and reasssuring but not reactive, follow up BPP was 8/8 and pt was discharged home.  She was deemed stable for discharge to home with outpatient follow up.  Discharge Exam: Temp:  [98 F (36.7 C)-98.7 F (37.1 C)] 98.2 F (36.8 C) (09/09 1504) Pulse Rate:  [81-88] 81 (09/09 1504) Resp:  [17-20] 17 (09/09 1504) BP: (110-135)/(70-93) 129/93 (09/09 1504) SpO2:  [97 %-99 %] 99 % (09/09 1504) Physical Examination: CONSTITUTIONAL: Well-developed, obese, well-nourished female in no acute distress.  HENT:  Normocephalic, atraumatic, External right and left ear normal. Oropharynx is clear and moist EYES: Conjunctivae and EOM are normal.  NECK: Normal range of motion, supple, no masses SKIN: Skin is warm and dry. No rash noted. Not diaphoretic. No erythema. No pallor. NEUROLGIC: Alert and oriented to person, place, and time. Normal reflexes, muscle tone coordination. No cranial nerve deficit noted. PSYCHIATRIC: Normal mood and affect. Normal behavior. Normal judgment and thought content. CARDIOVASCULAR: Normal heart rate noted, regular rhythm RESPIRATORY: Effort and breath sounds normal, no problems with respiration noted MUSCULOSKELETAL: Normal range of motion. No edema and no tenderness. 2+ distal pulses. ABDOMEN: Soft, nontender, nondistended, gravid. CERVIX:  deferred  Significant  Diagnostic Studies:  Results for orders placed or performed during the hospital encounter of 04/26/23 (from the past 168 hour(s))  Type and screen [facility]   Collection Time: 04/26/23  2:57 PM  Result Value Ref Range   ABO/RH(D) A POS    Antibody Screen NEG    Sample Expiration      04/29/2023,2359 Performed at [facility], [address], [place], [place] [address]   CBC   Collection Time: 04/26/23  3:04 PM  Result Value Ref Range   WBC 7.3 4.0 - 10.5 K/uL   RBC 4.69 3.87 - 5.11 MIL/uL   Hemoglobin 12.9 12.0 - 15.0 g/dL   HCT 62.9 52.8 - 41.3 %   MCV 82.9 80.0 - 100.0 fL   MCH 27.5 26.0 - 34.0 pg   MCHC 33.2 30.0 - 36.0 g/dL   RDW 24.4 01.0 - 27.2 %   Platelets 119 (L) 150 - 400 K/uL   nRBC 0.0 0.0 - 0.2 %  Glucose, capillary   Collection Time: 04/26/23  3:42 PM  Result Value Ref Range   Glucose-Capillary 180 (H) 70 - 99 mg/dL  Glucose, capillary   Collection Time: 04/26/23  4:56 PM  Result Value Ref Range   Glucose-Capillary 220 (H) 70 - 99 mg/dL  Glucose, capillary   Collection Time: 04/26/23  5:32 PM  Result Value Ref Range   Glucose-Capillary 231 (H) 70 - 99 mg/dL  Glucose, capillary   Collection Time: 04/26/23  6:04 PM  Result Value Ref Range   Glucose-Capillary 203 (H) 70 - 99 mg/dL  Glucose, capillary   Collection Time: 04/26/23 10:27 PM  Result Value Ref Range   Glucose-Capillary 168 (H) 70 - 99 mg/dL  Basic metabolic panel  Collection Time: 04/27/23  5:31 AM  Result Value Ref Range   Sodium 134 (L) 135 - 145 mmol/L   Potassium 3.1 (L) 3.5 - 5.1 mmol/L   Chloride 103 98 - 111 mmol/L   CO2 22 22 - 32 mmol/L   Glucose, Bld 102 (H) 70 - 99 mg/dL   BUN 7 6 - 20 mg/dL   Creatinine, Ser 1.61 0.44 - 1.00 mg/dL   Calcium 8.3 (L) 8.9 - 10.3 mg/dL   GFR, Estimated >09 >60 mL/min   Anion gap 9 5 - 15  CBC   Collection Time: 04/27/23  5:31 AM  Result Value Ref Range   WBC 7.9 4.0 - 10.5 K/uL   RBC 4.32 3.87 - 5.11 MIL/uL    Hemoglobin 11.7 (L) 12.0 - 15.0 g/dL   HCT 45.4 (L) 09.8 - 11.9 %   MCV 81.3 80.0 - 100.0 fL   MCH 27.1 26.0 - 34.0 pg   MCHC 33.3 30.0 - 36.0 g/dL   RDW 14.7 82.9 - 56.2 %   Platelets 101 (L) 150 - 400 K/uL   nRBC 0.0 0.0 - 0.2 %  Glucose, capillary   Collection Time: 04/27/23 10:04 AM  Result Value Ref Range   Glucose-Capillary 177 (H) 70 - 99 mg/dL  Glucose, capillary   Collection Time: 04/27/23  8:35 PM  Result Value Ref Range   Glucose-Capillary 183 (H) 70 - 99 mg/dL   Comment 1 Notify RN   Basic metabolic panel   Collection Time: 04/28/23  4:21 AM  Result Value Ref Range   Sodium 135 135 - 145 mmol/L   Potassium 3.3 (L) 3.5 - 5.1 mmol/L   Chloride 102 98 - 111 mmol/L   CO2 24 22 - 32 mmol/L   Glucose, Bld 128 (H) 70 - 99 mg/dL   BUN 6 6 - 20 mg/dL   Creatinine, Ser 1.30 0.44 - 1.00 mg/dL   Calcium 8.4 (L) 8.9 - 10.3 mg/dL   GFR, Estimated >86 >57 mL/min   Anion gap 9 5 - 15  Glucose, capillary   Collection Time: 04/28/23  7:35 AM  Result Value Ref Range   Glucose-Capillary 117 (H) 70 - 99 mg/dL  Glucose, capillary   Collection Time: 04/28/23  9:19 PM  Result Value Ref Range   Glucose-Capillary 125 (H) 70 - 99 mg/dL  Basic metabolic panel   Collection Time: 04/29/23  5:15 AM  Result Value Ref Range   Sodium 136 135 - 145 mmol/L   Potassium 3.1 (L) 3.5 - 5.1 mmol/L   Chloride 103 98 - 111 mmol/L   CO2 21 (L) 22 - 32 mmol/L   Glucose, Bld 98 70 - 99 mg/dL   BUN 10 6 - 20 mg/dL   Creatinine, Ser 8.46 0.44 - 1.00 mg/dL   Calcium 8.9 8.9 - 96.2 mg/dL   GFR, Estimated >95 >28 mL/min   Anion gap 12 5 - 15  Glucose, capillary   Collection Time: 04/29/23  7:46 AM  Result Value Ref Range   Glucose-Capillary 98 70 - 99 mg/dL   [place] MFM FETAL BPP WO NON STRESS  Result Date: 04/29/2023 ----------------------------------------------------------------------  OBSTETRICS REPORT                       (Signed Final 04/29/2023 03:55 pm)  ---------------------------------------------------------------------- Patient Info  ID #:       [id number]  D.O.B.:  November 04, 1985 (37 yrs)  Name:       Monique Schmidt                Visit Date: 04/29/2023 01:57 pm ---------------------------------------------------------------------- Performed By  Attending:        Ma Rings MD         Ref[facility] Address[facility]     [facility]                                                             [facility]  Performed By:     Marcellina Millin          [facility]  Referred By:      Warden Fillers MD ---------------------------------------------------------------------- Orders  #  Description                           Code        Ordered By  1  [place] MFM FETAL BPP WO NON               76819.01    Aniqua Briere     STRESS  2  [place] MFM UA CORD DOPPLER                76820.02    Mariel Aloe ----------------------------------------------------------------------  #  Order #                     Accession #                Episode #  1  [id number]                   [id number]                 [id number]  2  [id number]                   [id number]                 [id number] ---------------------------------------------------------------------- Indications  Pre-existing diabetes, type 2, in pregnancy,   O24.113  third trimester  [redacted] weeks gestation of pregnancy                Z3A.32  Obesity complicating pregnancy, third          O99.213  trimester (pregravid BMI 48)  Hypertension - Chronic/Pre-existing  O10.019  (nifedipine)  Advanced maternal age multigravida 6+,        O19.523  third trimester ---------------------------------------------------------------------- Fetal Evaluation  Num Of Fetuses:         1  Fetal Heart Rate(bpm):  138  Cardiac Activity:       Observed  Presentation:            Cephalic  Placenta:               Posterior  Amniotic Fluid  AFI FV:      Within normal limits  AFI Sum(cm)     %Tile       Largest Pocket(cm)  13.9            47          4.3  RUQ(cm)       RLQ(cm)       LUQ(cm)        LLQ(cm)  4             4.3           2.8            2.8 ---------------------------------------------------------------------- Biophysical Evaluation  Amniotic F.V:   Within normal limits       F. Tone:        Observed  F. Movement:    Observed                   Score:          8/8  F. Breathing:   Observed ---------------------------------------------------------------------- OB History  Gravidity:    7         Term:   5 ---------------------------------------------------------------------- Gestational Age  LMP:           33w 1d        Date:  09/09/22                  EDD:   06/16/23  Best:          Armida Sans 5d     Det. ByMarcella Dubs         EDD:   06/19/23                                      (11/29/22) ---------------------------------------------------------------------- Anatomy  Stomach:               Appears normal, left   Bladder:                Appears normal                         sided  Kidneys:               Appear normal ---------------------------------------------------------------------- Doppler - Fetal Vessels  Umbilical Artery   S/D     %tile      RI    %tile      PI    %tile     PSV    ADFV    RDFV                                                     (cm/s)    2.4  35     0.6       49     0.8       31     56.1      No      No ---------------------------------------------------------------------- Comments  This patient has been hospitalized for glycemic control.  Her  pregnancy has also been complicated by maternal obesity  and chronic hypertension treated with nifedipine.  A biophysical profile performed today was 8/8.  The AFI was 13.9 cm (within normal limits).  She will return to the MFM office in 3 days for another  ultrasound exam.  ----------------------------------------------------------------------                  Ma Rings, MD Electronically Signed Final Report   04/29/2023 03:55 pm ----------------------------------------------------------------------   [place] MFM UA CORD DOPPLER  Result Date: 04/29/2023 ----------------------------------------------------------------------  OBSTETRICS REPORT                       (Signed Final 04/29/2023 03:55 pm) ---------------------------------------------------------------------- Patient Info  ID #:       [id number]                          D.O.B.:  12-28-1985 (37 yrs)  Name:       Monique Schmidt                Visit Date: 04/29/2023 01:57 pm ---------------------------------------------------------------------- Performed By  Attending:        Ma Rings MD         Ref. Address:     [facility]                                                             Healthcare  Performed By:     Marcellina Millin          [facility]  Referred By:      Warden Fillers MD ---------------------------------------------------------------------- Orders  #  Description                           Code        Ordered By  1  [place] MFM FETAL BPP WO NON               25366.44    Chihiro Frey     STRESS  2  [place] MFM UA CORD DOPPLER                76820.02    Mariel Aloe ----------------------------------------------------------------------  #  Order #                     Accession #                Episode #  1  [id number]                   [id number]                 [id number]  2  [id number]                   [id number]                 [id number] ---------------------------------------------------------------------- Indications  Pre-existing diabetes, type 2, in pregnancy,   O24.113  third trimester  [redacted] weeks gestation of pregnancy                Z3A.32   Obesity complicating pregnancy, third          O99.213  trimester (pregravid BMI 48)  Hypertension - Chronic/Pre-existing            O10.019  (nifedipine)  Advanced maternal age multigravida 61+,        O29.523  third trimester ---------------------------------------------------------------------- Fetal Evaluation  Num Of Fetuses:         1  Fetal Heart Rate(bpm):  138  Cardiac Activity:       Observed  Presentation:           Cephalic  Placenta:               Posterior  Amniotic Fluid  AFI FV:      Within normal limits  AFI Sum(cm)     %Tile       Largest Pocket(cm)  13.9            47          4.3  RUQ(cm)       RLQ(cm)       LUQ(cm)        LLQ(cm)  4             4.3           2.8            2.8 ---------------------------------------------------------------------- Biophysical Evaluation  Amniotic F.V:   Within normal limits       F. Tone:        Observed  F. Movement:    Observed                   Score:          8/8  F. Breathing:   Observed ---------------------------------------------------------------------- OB History  Gravidity:    7         Term:   5 ---------------------------------------------------------------------- Gestational Age  LMP:           33w 1d        Date:  09/09/22                  EDD:   06/16/23  Best:          Armida Sans 5d     Det. ByMarcella Dubs         EDD:   06/19/23                                      (  11/29/22) ---------------------------------------------------------------------- Anatomy  Stomach:               Appears normal, left   Bladder:                Appears normal                         sided  Kidneys:               Appear normal ---------------------------------------------------------------------- Doppler - Fetal Vessels  Umbilical Artery   S/D     %tile      RI    %tile      PI    %tile     PSV    ADFV    RDFV                                                     (cm/s)    2.4       35     0.6       49     0.8       31     56.1      No      No  ---------------------------------------------------------------------- Comments  This patient has been hospitalized for glycemic control.  Her  pregnancy has also been complicated by maternal obesity  and chronic hypertension treated with nifedipine.  A biophysical profile performed today was 8/8.  The AFI was 13.9 cm (within normal limits).  She will return to the MFM office in 3 days for another  ultrasound exam. ----------------------------------------------------------------------                  Ma Rings, MD Electronically Signed Final Report   04/29/2023 03:55 pm ----------------------------------------------------------------------   [place] MFM FETAL BPP WO NON STRESS  Result Date: 04/25/2023 ----------------------------------------------------------------------  OBSTETRICS REPORT                       (Signed Final 04/25/2023 05:06 pm) ---------------------------------------------------------------------- Patient Info  ID #:       [id number]                          D.O.B.:  [birth year] (37 yrs)  Name:       Monique Schmidt                Visit Date: 04/25/2023 03:48 pm ---------------------------------------------------------------------- Performed By  Attending:        Noralee Space MD        Ref. Address:     [address]                                                             [place], [place]  [address]  Performed By:     Eden Lathe BS      [facility]  Referred By:      [facility] ---------------------------------------------------------------------- Orders  #  Description                           Code        Ordered By  1  [place] MFM FETAL BPP WO NON               76819.01    BURK  SCHAIBLE     STRESS  2  [place] MFM UA CORD DOPPLER                76820.02    [facility] ----------------------------------------------------------------------  #  Order #                     Accession #                Episode #  1  [id number]                   [id number]                 [id number]  2  [id number]                   [id number]                 [id number] ---------------------------------------------------------------------- Indications  Obesity complicating pregnancy, third          O99.213  trimester (pregravid BMI 48)  Pre-existing diabetes, type 2, in pregnancy,   O24.113  third trimester  Abnormal chromosomal and genetic finding       O28.5  on antenatal screening of mother (HIGH  RISK due to fetal DNA fraction--> repeat  normal))  Hypertension - Chronic/Pre-existing            O10.019  (nifedipine)  Advanced maternal age multigravida 6+,        O24.523  third trimester  Grand multiparity, antepartum                  O09.40  Uterine fibroids affecting pregnancy in third  O34.13,  D25.9  trimester, antepartum  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 Cord Insertion:      Previously seen  Amniotic Fluid  AFI FV:      Within normal limits  AFI Sum(cm)     %Tile       Largest Pocket(cm)  16.8            61          6.47  RUQ(cm)       RLQ(cm)       LUQ(cm)        LLQ(cm)  2.89          4.31          3.13           6.47 ---------------------------------------------------------------------- Biophysical Evaluation  Amniotic F.V:   Within normal limits       F. Tone:        Observed  F. Movement:    Observed                    Score:          8/8  F. Breathing:   Observed ---------------------------------------------------------------------- Biometry  LV:        5.8  mm ---------------------------------------------------------------------- OB History  Gravidity:    7         Term:   5 ---------------------------------------------------------------------- Gestational Age  LMP:           32w 4d        Date:  09/09/22                  EDD:   06/16/23  Best:          32w 1d     Det. By:  Marcella Dubs         EDD:   06/19/23                                      (11/29/22) ---------------------------------------------------------------------- Anatomy  Ventricles:            Appears normal         Kidneys:                Appear normal  Stomach:               Appears normal, left   Bladder:                Appears normal                         sided ---------------------------------------------------------------------- Doppler - Fetal Vessels  Umbilical Artery   S/D     %tile      RI    %tile  PI    %tile     PSV    ADFV    RDFV                                                     (cm/s)   2.81       59    0.64       63    1.01       72    41.43      No      No ---------------------------------------------------------------------- Impression  Amniotic fluid is normal and good fetal activity is seen  .Antenatal testing is reassuring. BPP 8/8.  Footling breech  presentation.  Patient reports her fasting levels are in the 170s and  postprandial levels are above 200 mg/dL.  She is on  OmniPod and Dexcom.  Chronic hypertension.  Blood pressure today at our office is  102/69 mmHg.  She takes nifedipine.  I counseled the patient that poorly controlled diabetes is  associated with increased risk of stillbirth.  I recommended  inpatient management to control diabetes.  Patient will discuss with the family and may consider  admission on Monday (04/29/2023). ----------------------------------------------------------------------  Recommendations  -Patient is considering admission. ----------------------------------------------------------------------                 Noralee Space, MD Electronically Signed Final Report   04/25/2023 05:06 pm ----------------------------------------------------------------------   [place] MFM UA CORD DOPPLER  Result Date: 04/25/2023 ----------------------------------------------------------------------  OBSTETRICS REPORT                       (Signed Final 04/25/2023 05:06 pm) ---------------------------------------------------------------------- Patient Info  ID #:       [id number]                          D.O.B.:  15-Jan-1986 (37 yrs)  Name:       Monique Schmidt                Visit Date: 04/25/2023 03:48 pm ---------------------------------------------------------------------- Performed By  Attending:        Noralee Space MD        Ref. Address:     [address]                                                             [place], [place]                                                             [address]  Performed By:     Eden Lathe BS      [facility]  Referred By:      [facility] ---------------------------------------------------------------------- Orders  #  Description                           Code        Ordered By  1  [place] MFM FETAL BPP WO NON               76819.01    BURK SCHAIBLE     STRESS  2  [place] MFM UA CORD DOPPLER                76820.02    [facility] ----------------------------------------------------------------------  #  Order #                     Accession #                Episode #  1  [id number]                   [id number]                 [id number]  2  [id number]                   [id number]                 [id number]  ---------------------------------------------------------------------- Indications  Obesity complicating pregnancy, third          O99.213  trimester (pregravid BMI 48)  Pre-existing diabetes, type 2, in pregnancy,   O24.113  third trimester  Abnormal chromosomal and genetic finding       O28.5  on antenatal screening of mother (HIGH  RISK due to fetal DNA fraction--> repeat  normal))  Hypertension - Chronic/Pre-existing            O10.019  (nifedipine)  Advanced maternal age multigravida 15+,        O18.523  third trimester  Grand multiparity, antepartum                  O09.40  Uterine fibroids affecting pregnancy in third  O34.13, D25.9  trimester, antepartum  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 Cord Insertion:      Previously seen  Amniotic Fluid  AFI FV:      Within normal limits  AFI Sum(cm)     %Tile       Largest Pocket(cm)  16.8            61          6.47  RUQ(cm)       RLQ(cm)       LUQ(cm)        LLQ(cm)  2.89          4.31          3.13           6.47 ---------------------------------------------------------------------- Biophysical Evaluation  Amniotic F.V:   Within normal limits       F. Tone:        Observed  F. Movement:    Observed                   Score:          8/8  F. Breathing:   Observed ---------------------------------------------------------------------- Biometry  LV:        5.8  mm ---------------------------------------------------------------------- OB History  Gravidity:    7         Term:   5 ---------------------------------------------------------------------- Gestational Age   LMP:           32w 4d        Date:  09/09/22                  EDD:   06/16/23  Best:          32w 1d     Det. ByMarcella Dubs         EDD:   06/19/23                                      (11/29/22) ---------------------------------------------------------------------- Anatomy  Ventricles:            Appears normal         Kidneys:                Appear normal  Stomach:               Appears normal, left   Bladder:                Appears normal                         sided ---------------------------------------------------------------------- Doppler - Fetal Vessels  Umbilical Artery   S/D     %tile      RI    %tile      PI    %tile     PSV    ADFV    RDFV                                                     (cm/s)   2.81       59  0.64       63    1.01       72    41.43      No      No ---------------------------------------------------------------------- Impression  Amniotic fluid is normal and good fetal activity is seen  .Antenatal testing is reassuring. BPP 8/8.  Footling breech  presentation.  Patient reports her fasting levels are in the 170s and  postprandial levels are above 200 mg/dL.  She is on  OmniPod and Dexcom.  Chronic hypertension.  Blood pressure today at our office is  102/69 mmHg.  She takes nifedipine.  I counseled the patient that poorly controlled diabetes is  associated with increased risk of stillbirth.  I recommended  inpatient management to control diabetes.  Patient will discuss with the family and may consider  admission on Monday (04/29/2023). ---------------------------------------------------------------------- Recommendations  -Patient is considering admission. ----------------------------------------------------------------------                 Noralee Space, MD Electronically Signed Final Report   04/25/2023 05:06 pm ----------------------------------------------------------------------   [place] MFM OB FOLLOW UP  Result Date:  04/11/2023 ----------------------------------------------------------------------  OBSTETRICS REPORT                       (Signed Final 04/11/2023 03:49 pm) ---------------------------------------------------------------------- Patient Info  ID #:       [id number]                          D.O.B.:  Dec 25, 1985 (37 yrs)  Name:       Monique Schmidt                Visit Date: 04/11/2023 02:44 pm ---------------------------------------------------------------------- Performed By  Attending:        Ma Rings MD         Ref. Address:     [address]                                                             [place], [place]                                                             [address]  Performed By:     Marcellina Millin       [facility]  Referred By:      [facility] ---------------------------------------------------------------------- Orders  #  Description                           Code        Ordered By  1  [place] MFM OB FOLLOW UP                   [phone]    [facility]  2  [place] MFM UA CORD DOPPLER                [id number]    RAVI SHANKAR  3  [place] MFM FETAL BPP                      [id number]     YU FANG     W/NONSTRESS ----------------------------------------------------------------------  #  Order #                     Accession #                Episode #  1  [id number]                   [id number]                 [id number]  2  [id number]                   [id number]                 [id number]  3  [id number]                   [id number]                 [id number] ---------------------------------------------------------------------- Indications  Obesity complicating pregnancy, third          O99.213  trimester (pregravid BMI 48)  Pre-existing diabetes, type  2, in pregnancy,   O24.113  third trimester  Abnormal chromosomal and genetic finding       O28.5  on antenatal screening of mother (HIGH  RISK due to fetal DNA fraction--> repeat  normal))  Hypertension - Chronic/Pre-existing            O10.019  (nifedipine)  Advanced maternal age multigravida 15+,        O27.523  third trimester  Grand multiparity, antepartum                  O09.40  Uterine fibroids affecting pregnancy in third  O34.13, D25.9  trimester, antepartum  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 Cord Insertion:      Previously seen  Amniotic Fluid  AFI FV:      Within normal limits  AFI Sum(cm)     %Tile       Largest Pocket(cm)  18.53           70          5.47  RUQ(cm)       RLQ(cm)       LUQ(cm)        LLQ(cm)  5.47          4.64          3.33           5.09 ---------------------------------------------------------------------- Biophysical Evaluation  Amniotic F.V:   Pocket => 2 cm             F. Tone:        Observed  F. Movement:    Observed                   Score:          8/8  F. Breathing:   Observed ---------------------------------------------------------------------- Biometry  BPD:      66.8  mm     G. Age:  26w 6d        < 1  %    CI:        67.78   %    70 - 86                                                          FL/HC:      19.6   %    19.2 - 21.4  HC:      259.6  mm     G. Age:  28w 2d        < 1  %    HC/AC:      0.94        0.99 - 1.21  AC:      277.1  mm     G. Age:  31w 5d         88  %     FL/BPD:     76.0   %    71 - 87  FL:       50.8  mm     G. Age:  27w 2d        < 1  %    FL/AC:      18.3   %    20 - 24  Est. FW:    1426  gm      3 lb 2 oz     21  % ---------------------------------------------------------------------- OB History  Gravidity:    7         Term:   5 ---------------------------------------------------------------------- Gestational Age  LMP:           30w 4d  Date:  09/09/22                  EDD:   06/16/23  U/S Today:     28w 4d                                        EDD:   06/30/23  Best:          30w 1d     Det. ByMarcella Dubs         EDD:   06/19/23                                      (11/29/22) ---------------------------------------------------------------------- Anatomy  Cranium:               Appears normal         Aortic Arch:            Previously seen  Cavum:                 Previously seen        Ductal Arch:            Previously seen  Ventricles:            Previously seen        Diaphragm:              Previously seen  Choroid Plexus:        Previously seen        Stomach:                Appears normal, left                                                                        sided  Cerebellum:            Previously seen        Abdomen:                Appears normal  Posterior Fossa:       Previously seen        Abdominal Wall:         Previously seen  Nuchal Fold:           Previously seen        Cord Vessels:           Previously seen  Face:                  Orbits and profile     Kidneys:                Previously seen                         previously seen  Lips:                  Previously seen        Bladder:  Appears normal  Thoracic:              Previously seen        Spine:                  Previously seen  Heart:                 Previously seen        Upper Extremities:      Previously seen  RVOT:                  Previously seen        Lower Extremities:      Previously seen  LVOT:                  Previously seen  Other:   3VV, Hands and feet/heels previously visualized. Female gender          previously seen. Technically difficult due to maternal habitus and fetal          position. ---------------------------------------------------------------------- Doppler - Fetal Vessels  Umbilical Artery   S/D     %tile      RI    %tile      PI    %tile     PSV    ADFV    RDFV                                                     (cm/s)   2.91       55    0.66       61    0.98       56     53.1      No      No ---------------------------------------------------------------------- Comments  This patient was seen due to IUGR.  Her pregnancy has also  been complicated by pregestational diabetes, maternal  obesity, and chronic hypertension.  She denies any problems  since her last exam and reports feeling vigorous fetal  movements throughout the day.  On today's exam, the EFW of 3 pounds 2 ounces measures  at the 21st percentile for her gestational age.  The fetus has  grown over 1 pound over the past 3 weeks.  The total AFI was 18.53 cm (within normal limits).  A BPP performed today was 8 out of 8.  The NST was unable  to be obtained today due to maternal body habitus and fetal  movements.  Doppler studies of the umbilical arteries showed a normal  S/D ratio of 2.91 .  There were no signs of absent or reversed  end-diastolic flow.  Due to her underlying medical conditions and borderline  IUGR, delivery is recommended at around 37 weeks.  She  should continue weekly fetal testing until delivery.  She will return in 1 week for another BPP and umbilical artery  Doppler study. ----------------------------------------------------------------------                   Ma Rings, MD Electronically Signed Final Report   04/11/2023 03:49 pm ----------------------------------------------------------------------   [place] MFM UA CORD DOPPLER  Result Date: 04/11/2023 ----------------------------------------------------------------------  OBSTETRICS REPORT                        (Signed Final 04/11/2023 03:49 pm) ----------------------------------------------------------------------  Patient Info  ID #:       [id number]                          D.O.B.:  1986-05-07 (37 yrs)  Name:       Monique Schmidt                Visit Date: 04/11/2023 02:44 pm ---------------------------------------------------------------------- Performed By  Attending:        Ma Rings MD         Ref. Address:     [address]                                                             [place], [place]                                                             [address]  Performed By:     Marcellina Millin       [facility]  Referred By:      [facility] ---------------------------------------------------------------------- Orders  #  Description                           Code        Ordered By  1  [place] MFM OB FOLLOW UP                   [id number]    RAVI [facility]  2  [place] MFM UA CORD DOPPLER                76820.02    RAVI SHANKAR  3  [place] MFM FETAL BPP                      [id number]  Rosana Hoes     W/NONSTRESS ----------------------------------------------------------------------  #  Order #                     Accession #                Episode #  1  [id number]                   [id number]                 [id number]  2  [id number]                   [id number]                 [id number]  3  [id number]                   [id number]                 [id number] ---------------------------------------------------------------------- Indications  Obesity complicating pregnancy, third          O99.213  trimester (pregravid BMI 48)  Pre-existing diabetes, type 2, in pregnancy,   O24.113  third trimester  Abnormal chromosomal and genetic finding       O28.5  on  antenatal screening of mother (HIGH  RISK due to fetal DNA fraction--> repeat  normal))  Hypertension - Chronic/Pre-existing            O10.019  (nifedipine)  Advanced maternal age multigravida 10+,        O80.523  third trimester  Grand multiparity, antepartum                  O09.40  Uterine fibroids affecting pregnancy in third  O34.13, D25.9  trimester, antepartum  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 Cord Insertion:      Previously seen  Amniotic Fluid  AFI FV:      Within normal limits  AFI Sum(cm)     %Tile       Largest Pocket(cm)  18.53           70          5.47  RUQ(cm)       RLQ(cm)       LUQ(cm)        LLQ(cm)  5.47          4.64          3.33           5.09 ---------------------------------------------------------------------- Biophysical Evaluation  Amniotic F.V:   Pocket => 2 cm             F. Tone:        Observed  F. Movement:    Observed  Score:          8/8  F. Breathing:   Observed ---------------------------------------------------------------------- Biometry  BPD:      66.8  mm     G. Age:  26w 6d        < 1  %    CI:        67.78   %    70 - 86                                                          FL/HC:      19.6   %    19.2 - 21.4  HC:      259.6  mm     G. Age:  28w 2d        < 1  %    HC/AC:      0.94        0.99 - 1.21  AC:      277.1  mm     G. Age:  31w 5d         88  %    FL/BPD:     76.0   %    71 - 87  FL:       50.8  mm     G. Age:  27w 2d        < 1  %    FL/AC:       18.3   %    20 - 24  Est. FW:    1426  gm      3 lb 2 oz     21  % ---------------------------------------------------------------------- OB History  Gravidity:    7         Term:   5 ---------------------------------------------------------------------- Gestational Age  LMP:           30w 4d        Date:  09/09/22                  EDD:   06/16/23  U/S Today:     28w 4d                                        EDD:   06/30/23  Best:          30w 1d     Det. ByMarcella Dubs         EDD:   06/19/23                                      (11/29/22) ---------------------------------------------------------------------- Anatomy  Cranium:               Appears normal         Aortic Arch:            Previously seen  Cavum:                 Previously seen        Ductal Arch:            Previously seen  Ventricles:  Previously seen        Diaphragm:              Previously seen  Choroid Plexus:        Previously seen        Stomach:                Appears normal, left                                                                        sided  Cerebellum:            Previously seen        Abdomen:                Appears normal  Posterior Fossa:       Previously seen        Abdominal Wall:         Previously seen  Nuchal Fold:           Previously seen        Cord Vessels:           Previously seen  Face:                  Orbits and profile     Kidneys:                Previously seen                         previously seen  Lips:                  Previously seen        Bladder:                Appears normal  Thoracic:              Previously seen        Spine:                  Previously seen  Heart:                 Previously seen        Upper Extremities:      Previously seen  RVOT:                  Previously seen        Lower Extremities:      Previously seen  LVOT:                  Previously seen  Other:  3VV, Hands and feet/heels previously visualized. Female gender          previously seen. Technically  difficult due to maternal habitus and fetal          position. ---------------------------------------------------------------------- Doppler - Fetal Vessels  Umbilical Artery   S/D     %tile      RI    %tile      PI    %tile     PSV    ADFV    RDFV                                                     (  cm/s)   2.91       55    0.66       61    0.98       56     53.1      No      No ---------------------------------------------------------------------- Comments  This patient was seen due to IUGR.  Her pregnancy has also  been complicated by pregestational diabetes, maternal  obesity, and chronic hypertension.  She denies any problems  since her last exam and reports feeling vigorous fetal  movements throughout the day.  On today's exam, the EFW of 3 pounds 2 ounces measures  at the 21st percentile for her gestational age.  The fetus has  grown over 1 pound over the past 3 weeks.  The total AFI was 18.53 cm (within normal limits).  A BPP performed today was 8 out of 8.  The NST was unable  to be obtained today due to maternal body habitus and fetal  movements.  Doppler studies of the umbilical arteries showed a normal  S/D ratio of 2.91 .  There were no signs of absent or reversed  end-diastolic flow.  Due to her underlying medical conditions and borderline  IUGR, delivery is recommended at around 37 weeks.  She  should continue weekly fetal testing until delivery.  She will return in 1 week for another BPP and umbilical artery  Doppler study. ----------------------------------------------------------------------                   Ma Rings, MD Electronically Signed Final Report   04/11/2023 03:49 pm ----------------------------------------------------------------------   [place] MFM FETAL BPP W/NONSTRESS  Result Date: 04/11/2023 ----------------------------------------------------------------------  OBSTETRICS REPORT                       (Signed Final 04/11/2023 03:49 pm)  ---------------------------------------------------------------------- Patient Info  ID #:       [id number]                          D.O.B.:  August 05, 1986 (37 yrs)  Name:       Monique Schmidt                Visit Date: 04/11/2023 02:44 pm ---------------------------------------------------------------------- Performed By  Attending:        Ma Rings MD         Ref. Address:     [address]                                                             [place], [place]                                                             [address]  Performed By:     Marcellina Millin       [facility]  Referred By:      [facility] ---------------------------------------------------------------------- Orders  #  Description                           Code        Ordered By  1  [place] MFM OB FOLLOW UP                   [phone]    RAVI SHANKAR  2  [place] MFM UA CORD DOPPLER                [id number]    RAVI SHANKAR  3  [place] MFM FETAL BPP                      82956.2     YU FANG     W/NONSTRESS ----------------------------------------------------------------------  #  Order #                     Accession #                Episode #  1  [id number]                   [id number]                 [id number]  2  [id number]                   [id number]                 [id number]  3  [id number]                   [id number]                 [id number] ---------------------------------------------------------------------- Indications  Obesity complicating pregnancy, third          O99.213  trimester (pregravid BMI 48)  Pre-existing diabetes, type 2, in pregnancy,   O24.113  third trimester  Abnormal chromosomal and genetic finding       O28.5  on antenatal screening of mother (HIGH  RISK due to fetal  DNA fraction--> repeat  normal))  Hypertension - Chronic/Pre-existing            O10.019  (nifedipine)  Advanced maternal age multigravida 68+,        O28.523  third trimester  Grand multiparity, antepartum                  O09.40  Uterine fibroids affecting pregnancy in third  O34.13, D25.9  trimester,  antepartum  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 Cord Insertion:      Previously seen  Amniotic Fluid  AFI FV:      Within normal limits  AFI Sum(cm)     %Tile       Largest Pocket(cm)  18.53           70          5.47  RUQ(cm)       RLQ(cm)       LUQ(cm)        LLQ(cm)  5.47          4.64          3.33           5.09 ---------------------------------------------------------------------- Biophysical Evaluation  Amniotic F.V:   Pocket => 2 cm             F. Tone:        Observed  F. Movement:    Observed                   Score:          8/8  F. Breathing:   Observed ---------------------------------------------------------------------- Biometry  BPD:      66.8  mm     G. Age:  26w 6d        < 1  %    CI:        67.78   %    70 - 86                                                          FL/HC:      19.6   %    19.2 - 21.4  HC:      259.6  mm     G. Age:  28w 2d        < 1  %    HC/AC:      0.94        0.99 - 1.21  AC:      277.1  mm     G. Age:  31w 5d         88  %    FL/BPD:     76.0   %    71 - 87  FL:       50.8  mm     G. Age:  27w 2d        < 1  %    FL/AC:      18.3   %    20 -  24  Est. FW:    1426  gm      3 lb 2  oz     21  % ---------------------------------------------------------------------- OB History  Gravidity:    7         Term:   5 ---------------------------------------------------------------------- Gestational Age  LMP:           30w 4d        Date:  09/09/22                  EDD:   06/16/23  U/S Today:     28w 4d                                        EDD:   06/30/23  Best:          30w 1d     Det. ByMarcella Dubs         EDD:   06/19/23                                      (11/29/22) ---------------------------------------------------------------------- Anatomy  Cranium:               Appears normal         Aortic Arch:            Previously seen  Cavum:                 Previously seen        Ductal Arch:            Previously seen  Ventricles:            Previously seen        Diaphragm:              Previously seen  Choroid Plexus:        Previously seen        Stomach:                Appears normal, left                                                                        sided  Cerebellum:            Previously seen        Abdomen:                Appears normal  Posterior Fossa:       Previously seen        Abdominal Wall:         Previously seen  Nuchal Fold:           Previously seen        Cord Vessels:           Previously seen  Face:                  Orbits and profile     Kidneys:  Previously seen                         previously seen  Lips:                  Previously seen        Bladder:                Appears normal  Thoracic:              Previously seen        Spine:                  Previously seen  Heart:                 Previously seen        Upper Extremities:      Previously seen  RVOT:                  Previously seen        Lower Extremities:      Previously seen  LVOT:                  Previously seen  Other:  3VV, Hands and feet/heels previously visualized. Female gender          previously seen. Technically difficult due to maternal habitus and fetal           position. ---------------------------------------------------------------------- Doppler - Fetal Vessels  Umbilical Artery   S/D     %tile      RI    %tile      PI    %tile     PSV    ADFV    RDFV                                                     (cm/s)   2.91       55    0.66       61    0.98       56     53.1      No      No ---------------------------------------------------------------------- Comments  This patient was seen due to IUGR.  Her pregnancy has also  been complicated by pregestational diabetes, maternal  obesity, and chronic hypertension.  She denies any problems  since her last exam and reports feeling vigorous fetal  movements throughout the day.  On today's exam, the EFW of 3 pounds 2 ounces measures  at the 21st percentile for her gestational age.  The fetus has  grown over 1 pound over the past 3 weeks.  The total AFI was 18.53 cm (within normal limits).  A BPP performed today was 8 out of 8.  The NST was unable  to be obtained today due to maternal body habitus and fetal  movements.  Doppler studies of the umbilical arteries showed a normal  S/D ratio of 2.91 .  There were no signs of absent or reversed  end-diastolic flow.  Due to her underlying medical conditions and borderline  IUGR, delivery is recommended at around 37 weeks.  She  should continue weekly fetal testing until delivery.  She will return in 1 week for another BPP and umbilical artery  Doppler study. ----------------------------------------------------------------------  Ma Rings, MD Electronically Signed Final Report   04/11/2023 03:49 pm ----------------------------------------------------------------------   [place] MFM UA CORD DOPPLER  Result Date: 04/04/2023 ----------------------------------------------------------------------  OBSTETRICS REPORT                       (Signed Final 04/04/2023 12:23 pm) ---------------------------------------------------------------------- Patient Info  ID #:        [id number]                          D.O.B.:  10/31/1985 (37 yrs)  Name:       Monique Schmidt                Visit Date: 04/04/2023 11:58 am ---------------------------------------------------------------------- Performed By  Attending:        Braxton Feathers DO       Ref. Address:     [address]                                                             [place], [place]                                                             [address]  Performed By:     Eden Lathe BS      [facility]                    RD[facility] RV[facility]                                 [facility]  Referred By:      [facility] ---------------------------------------------------------------------- Orders  #  Description                           Code        Ordered By  1  [place] MFM UA CORD DOPPLER                76820.02    RAVI SHANKAR  2  [place] MFM OB LIMITED  08657.84    RAVI [facility] ----------------------------------------------------------------------  #  Order #                     Accession #                Episode #  1  [id number]                   [id number]                 [id number]  2  [id number]                   [id number]                 [id number] ---------------------------------------------------------------------- Indications  Obesity complicating pregnancy, third          O99.213  trimester (pregravid BMI 48)  Pre-existing diabetes, type 2, in pregnancy,   O24.113  third trimester  Abnormal chromosomal and genetic finding       O28.5  on antenatal screening of mother (HIGH  RISK due to fetal DNA fraction--> repeat  normal))  Hypertension - Chronic/Pre-existing            O10.019  (nifedipine)  Advanced maternal age multigravida 39+,        O4.523  third trimester  Grand multiparity, antepartum                  O09.40  Uterine  fibroids affecting pregnancy in third  O34.13, D25.9  trimester, antepartum  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 Cord Insertion:      Previously seen  Amniotic Fluid  AFI FV:      Within normal limits  AFI Sum(cm)     %Tile       Largest Pocket(cm)  16.02           58          7.96  RUQ(cm)       RLQ(cm)       LUQ(cm)        LLQ(cm)  3.77          7.96          3.31           0.98 ---------------------------------------------------------------------- OB History  Gravidity:    7         Term:   5 ---------------------------------------------------------------------- Gestational Age  LMP:           29w 4d        Date:  09/09/22                  EDD:   06/16/23  Best:          29w 1d     Det. By:  Marcella Dubs  EDD:   06/19/23                                      (11/29/22) ---------------------------------------------------------------------- Anatomy  Stomach:               Appears normal, left   Bladder:                Appears normal                         sided  Kidneys:               Appear normal ---------------------------------------------------------------------- Doppler - Fetal Vessels  Umbilical Artery   S/D     %tile      RI    %tile      PI    %tile     PSV    ADFV    RDFV                                                     (cm/s)   3.06       58    0.67       61    1.09       74    52.63      No      No ---------------------------------------------------------------------- Comments  The patient is here for a follow-up ultrasound for FGR at 29w  1d. EDD: 06/19/2023. Dating:  Early Ultrasound  (11/29/22).  She has no concerns today.  Sonographic findings  Single intrauterine pregnancy.  Fetal cardiac activity:  Observed and appears normal.  Presentation: Transverse, head to maternal right.  Interval fetal anatomy appears normal.  Amniotic fluid volume: Within normal limits. AFI: 16.02 cm.  MVP: 7.96 cm.  Placenta: Posterior.  Umbilical artery dopplers findings:  -S/D:3.06 which are normal at this gestational age.  -Absent end-diastolic flow: No.  -Reversed end-diastolic flow:  No.  NST was reactive.  Recommendations  - Continue weekly UA dopplers and antenatal testing until  delivery.  - Serial growth [place] every 3 weeks until delivery.  - Delivery likely around 38 to 39 weeks or sooner if indicated.  There are limitations of prenatal ultrasound such as the  inability to detect certain abnormalities due to poor  visualization. Various factors such as fetal position,  gestational age and maternal body habitus may increase the  difficulty in visualizing the fetal anatomy. ----------------------------------------------------------------------                  Braxton Feathers, DO Electronically Signed Final Report   04/04/2023 12:23 pm ----------------------------------------------------------------------   [place] MFM OB LIMITED  Result Date: 04/04/2023 ----------------------------------------------------------------------  OBSTETRICS REPORT                       (Signed Final 04/04/2023 12:23 pm) ---------------------------------------------------------------------- Patient Info  ID #:       [id number]                          D.O.B.:  02-19-1986 (37 yrs)  Name:       Monique Schmidt  Visit Date: 04/04/2023 11:58 am ---------------------------------------------------------------------- Performed By  Attending:        Braxton Feathers DO       Ref. Address:     [address]                                                             [place], [place]                                                              [address]  Performed By:     Eden Lathe BS      Location[facility]         [facility]                    RDMS RVT                                 [facility]  Referred By:      [facility] ---------------------------------------------------------------------- Orders  #  Description                           Code        Ordered By  1  [place] MFM UA CORD DOPPLER                76820.02    RAVI [facility]  2  [place] MFM OB LIMITED                     [id number]    RAVI [facility] ----------------------------------------------------------------------  #  Order #                     Accession #                Episode #  1  [id number]                   [id number]                 [id number]  2  [id number]  [id number]                 [id number] ---------------------------------------------------------------------- Indications  Obesity complicating pregnancy, third          O99.213  trimester (pregravid BMI 48)  Pre-existing diabetes, type 2, in pregnancy,   O24.113  third trimester  Abnormal chromosomal and genetic finding       O28.5  on antenatal screening of mother (HIGH  RISK due to fetal DNA fraction--> repeat  normal))  Hypertension - Chronic/Pre-existing            O10.019  (nifedipine)  Advanced maternal age multigravida 76+,        O72.523  third trimester  Grand multiparity, antepartum                  O09.40  Uterine fibroids affecting pregnancy in third  O34.13, D25.9  trimester, antepartum  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 Cord Insertion:      Previously seen  Amniotic Fluid  AFI FV:      Within normal limits  AFI Sum(cm)     %Tile       Largest Pocket(cm)  16.02           58          7.96  RUQ(cm)       RLQ(cm)       LUQ(cm)        LLQ(cm)  3.77          7.96          3.31           0.98 ---------------------------------------------------------------------- OB History  Gravidity:    7         Term:   5 ---------------------------------------------------------------------- Gestational Age  LMP:           29w 4d        Date:  09/09/22                  EDD:   06/16/23  Best:          29w 1d     Det. ByMarcella Dubs         EDD:   06/19/23                                      (11/29/22) ---------------------------------------------------------------------- Anatomy  Stomach:               Appears normal, left   Bladder:                Appears normal                         sided  Kidneys:               Appear normal ---------------------------------------------------------------------- Doppler - Fetal Vessels  Umbilical Artery   S/D     %tile      RI    %tile      PI    %tile     PSV    ADFV    RDFV                                                     (cm/s)   3.06       58    0.67       61    1.09       74    52.63      No      No ---------------------------------------------------------------------- Comments  The patient is here for a follow-up ultrasound for FGR at 29w  1d. EDD: 06/19/2023. Dating: Early Ultrasound  (11/29/22).  She has no concerns today.  Sonographic findings  Single intrauterine pregnancy.  Fetal cardiac activity:  Observed and appears normal.  Presentation: Transverse, head to maternal right.  Interval fetal anatomy appears normal.  Amniotic fluid volume: Within normal limits. AFI: 16.02 cm.  MVP: 7.96 cm.  Placenta: Posterior.  Umbilical artery dopplers findings:  -S/D:3.06 which are  normal at this gestational age.  -Absent end-diastolic flow: No.  -Reversed end-diastolic flow:  No.  NST was reactive.  Recommendations  - Continue weekly UA dopplers and antenatal testing until  delivery.  - Serial growth [place] every 3 weeks until delivery.  - Delivery likely around 38 to 39 weeks or sooner if indicated.  There are limitations of prenatal ultrasound such as the  inability to detect certain abnormalities due to poor  visualization. Various factors such as fetal position,  gestational age and maternal body habitus may increase the  difficulty in visualizing the fetal anatomy. ----------------------------------------------------------------------                  Braxton Feathers, DO Electronically Signed Final Report   04/04/2023 12:23 pm ----------------------------------------------------------------------    Future Appointments  Date Time Provider Department Center  05/02/2023  9:15 AM [facility] NURSE [facility]  05/02/2023  9:30 AM [facility] US3 [facility]  05/02/2023 12:00 PM Bonnita Levan, RD [facility] NDM  05/09/2023 10:15 AM Reva Bores, MD [facility] [place] [facility]  05/20/2023 10:15 AM Adam Phenix, MD [facility]    Discharge Condition: Stable  Discharge disposition: 01-Home or Self Care       Discharge Instructions     Discharge activity:  No Restrictions   Complete by: As directed    Discharge diet:   Complete by: As directed    Carb controlled diet   Fetal Kick Count:  Lie on our left side for one hour after a meal, and count the number of times your baby kicks.  If it is less than 5 times, get up, move around and drink some juice.  Repeat the test 30 minutes later.  If it is still less than 5 kicks in an hour, notify your doctor.   Complete by: As directed    No sexual activity restrictions   Complete by: As directed    Notify physician for a general feeling that "something is not right"   Complete by: As directed  Notify physician for increase or change in vaginal  discharge   Complete by: As directed    Notify physician for intestinal cramps, with or without diarrhea, sometimes described as "gas pain"   Complete by: As directed    Notify physician for leaking of fluid   Complete by: As directed    Notify physician for low, dull backache, unrelieved by heat or Tylenol   Complete by: As directed    Notify physician for menstrual like cramps   Complete by: As directed    Notify physician for pelvic pressure   Complete by: As directed    Notify physician for uterine contractions.  These may be painless and feel like the uterus is tightening or the baby is  "balling up"   Complete by: As directed    Notify physician for vaginal bleeding   Complete by: As directed    PRETERM LABOR:  Includes any of the follwing symptoms that occur between 20 - [redacted] weeks gestation.  If these symptoms are not stopped, preterm labor can result in preterm delivery, placing your baby at risk   Complete by: As directed       Allergies as of 04/29/2023       Reactions   Latex Itching, Rash   Penicillins Itching, Rash   Did it involve swelling of the face/tongue/throat, SOB, or low BP? N Did it involve sudden or severe rash/hives, skin peeling, or any reaction on the inside of your mouth or nose? Y Did you need to seek medical attention at a hospital or doctor's office? No When did it last happen?  Several  Years Ago    If all above answers are "NO", may proceed with cephalosporin use.        Medication List     STOP taking these medications    cetirizine 10 MG tablet Commonly known as: ZyrTEC Allergy   fluticasone 44 MCG/ACT inhaler Commonly known as: Flovent HFA   HumuLIN N KwikPen 100 UNIT/ML KwikPen Generic drug: Insulin NPH (Human) (Isophane) Replaced by: insulin NPH Human 100 UNIT/ML injection   metFORMIN 500 MG 24 hr tablet Commonly known as: GLUCOPHAGE-XR   Omnipod DASH Pods (Gen 4) Misc       TAKE these medications    Accu-Chek Guide test  strip Generic drug: glucose blood Use four times per day as instructed   Accu-Chek Softclix Lancets lancets Use four times per day as instructed   acetaminophen 500 MG tablet Commonly known as: TYLENOL Take 2 tablets (1,000 mg total) by mouth every 6 (six) hours as needed for fever, headache or mild pain.   aspirin EC 81 MG tablet Take 2 tablets (162 mg total) by mouth daily. Start taking when you are [redacted] weeks pregnant for rest of pregnancy for prevention of preeclampsia   BD Insulin Syringe U/F 31G X 5/16" 1 ML Misc Generic drug: Insulin Syringe-Needle U-100 Use 2 (two) times daily.   BD Pen Needle Nano 2nd Gen 32G X 4 MM Misc Generic drug: Insulin Pen Needle Inject into the skin 2 (two) times daily.   blood glucose meter kit and supplies Kit Dispense based on patient and insurance preference. Use up to four times daily as directed.   cyclobenzaprine 10 MG tablet Commonly known as: FLEXERIL Take 1 tablet (10 mg total) by mouth every 8 (eight) hours as needed for muscle spasms.   Dexcom G7 Sensor Misc Replace sensor every 10 days   insulin NPH Human 100 UNIT/ML injection Commonly known  as: NOVOLIN N Inject 0.46 mLs (46 Units total) into the skin 2 (two) times daily at 8 am and 10 pm. Replaces: HumuLIN N KwikPen 100 UNIT/ML KwikPen   NIFEdipine 30 MG 24 hr tablet Commonly known as: Procardia XL Take 1 tablet (30 mg total) by mouth daily.   NovoLOG FlexPen 100 UNIT/ML FlexPen Generic drug: insulin aspart Inject 20 Units into the skin 3 (three) times daily with meals. What changed: how much to take   Ventolin HFA 108 (90 Base) MCG/ACT inhaler Generic drug: albuterol Inhale 1-2 puffs into the lungs every 6 (six) hours as needed for wheezing or shortness of breath.   Vitafol Gummies 3.33-0.333-34.8 MG Chew Chew 3 tablets by mouth daily.        Follow-up Information     [facility] MATERNAL FETAL ULTRASOUND .   Specialty: Radiology Contact information: [address] [place] [address] [phone]        Reva Bores, MD Follow up on 05/09/2023.   Specialty: Obstetrics and Gynecology Why: routine OB visit Contact information: [address] [place] [address] [phone]                 Total discharge time: 30 minutes   Signed: Warden Fillers M.D. 04/29/2023, 5:47 PM

## 2023-04-29 NOTE — Progress Notes (Signed)
   04/29/23 1630  Departure Condition  Departure Condition Good  Mobility at [facility]  Patient/Caregiver Teaching Teach Back Method Used;Discharge instructions reviewed;Prescriptions reviewed;Follow-up care reviewed;Pain management discussed;Medications discussed;Patient/caregiver verbalized understanding;Educated about hypertension in pregnancy  Departure Mode By self  Was procedural sedation performed on this patient during this visit? No   Patient alert and oriented x4, and VS and pain stable.

## 2023-04-30 ENCOUNTER — Other Ambulatory Visit: Payer: Self-pay

## 2023-05-02 ENCOUNTER — Ambulatory Visit: Payer: Medicaid Other | Admitting: *Deleted

## 2023-05-02 ENCOUNTER — Other Ambulatory Visit: Payer: Self-pay

## 2023-05-02 ENCOUNTER — Other Ambulatory Visit: Payer: Self-pay | Admitting: *Deleted

## 2023-05-02 ENCOUNTER — Ambulatory Visit: Payer: Medicaid Other | Attending: Maternal & Fetal Medicine

## 2023-05-02 ENCOUNTER — Telehealth: Payer: Self-pay | Admitting: Dietician

## 2023-05-02 ENCOUNTER — Encounter: Payer: Medicaid Other | Attending: Family Medicine | Admitting: Dietician

## 2023-05-02 VITALS — BP 126/91 | HR 94

## 2023-05-02 DIAGNOSIS — O0990 Supervision of high risk pregnancy, unspecified, unspecified trimester: Secondary | ICD-10-CM

## 2023-05-02 DIAGNOSIS — O24113 Pre-existing diabetes mellitus, type 2, in pregnancy, third trimester: Secondary | ICD-10-CM

## 2023-05-02 DIAGNOSIS — O98213 Gonorrhea complicating pregnancy, third trimester: Secondary | ICD-10-CM | POA: Insufficient documentation

## 2023-05-02 DIAGNOSIS — Z794 Long term (current) use of insulin: Secondary | ICD-10-CM

## 2023-05-02 DIAGNOSIS — O10013 Pre-existing essential hypertension complicating pregnancy, third trimester: Secondary | ICD-10-CM

## 2023-05-02 DIAGNOSIS — O99213 Obesity complicating pregnancy, third trimester: Secondary | ICD-10-CM | POA: Insufficient documentation

## 2023-05-02 DIAGNOSIS — O10913 Unspecified pre-existing hypertension complicating pregnancy, third trimester: Secondary | ICD-10-CM | POA: Insufficient documentation

## 2023-05-02 DIAGNOSIS — Z3A33 33 weeks gestation of pregnancy: Secondary | ICD-10-CM | POA: Diagnosis not present

## 2023-05-02 DIAGNOSIS — Z713 Dietary counseling and surveillance: Secondary | ICD-10-CM | POA: Diagnosis not present

## 2023-05-02 DIAGNOSIS — O09523 Supervision of elderly multigravida, third trimester: Secondary | ICD-10-CM | POA: Diagnosis not present

## 2023-05-02 DIAGNOSIS — E1169 Type 2 diabetes mellitus with other specified complication: Secondary | ICD-10-CM

## 2023-05-02 DIAGNOSIS — O24414 Gestational diabetes mellitus in pregnancy, insulin controlled: Secondary | ICD-10-CM | POA: Diagnosis not present

## 2023-05-02 DIAGNOSIS — O24119 Pre-existing diabetes mellitus, type 2, in pregnancy, unspecified trimester: Secondary | ICD-10-CM

## 2023-05-02 DIAGNOSIS — O365930 Maternal care for other known or suspected poor fetal growth, third trimester, not applicable or unspecified: Secondary | ICD-10-CM | POA: Diagnosis present

## 2023-05-02 DIAGNOSIS — E669 Obesity, unspecified: Secondary | ICD-10-CM

## 2023-05-02 NOTE — Progress Notes (Signed)
Patient was seen for Diabetes self-management on 03/05/2023     Start time 1615 and End time 1700  Patient is here today alone.  She has had close follow up with MD and CDCES regarding blood glucose.  Her insulin pump was discontinued last week due to poorly controlled blood glucose.  She is now taking daily injections.  Problems with always picking up Dexcom sensors and PODs which have caused inconsistency of use.  CGM report reviewed.  Blood glucose remains high.  Patient states that the insulin pump was easier.  Glucose high on both treatment options.  MD messaged regarding high blood glucose. She states that she is having problems eating fruit and vegetables during this pregnancy.  Estimated due date: 06/19/2023; [redacted]w[redacted]d  Clinical: Medications: prenatal vitamin, NPH 46 units at 8 am and 10 pm, 20 units Novolog before each meal Medical History: Type 2 Diabetes, GDM, HTN A1c 12.7% 11/19/22 and 12.9% 01/08/2022   Dietary and Lifestyle History: Patient has 5 daughters.  Physical Activity: walk Stress: high Sleep: good   24 hr Recall:  First Meal: egg and cheese sandwich on [employer] (dry toast), 1% milk Snack: Second meal:  Vienna sausage, graham crackers with PB crackers Snack: Third meal:   baked chicken, green beans,  Beverages:  water, zero sugar soda, 1% milk  NUTRITION INTERVENTION  Nutrition education (E-1) on the following topics:   Initial Follow-up  []  []  Definition of Gestational Diabetes []  [x]  Why dietary management is important in controlling blood glucose []  []  Effects each nutrient has on blood glucose levels []  []  Simple carbohydrates vs complex carbohydrates [x]  []  Fluid intake [x]  [x]  Creating a balanced meal plan []  [x]  Carbohydrate counting  [x]  []  When to check blood glucose levels []  []  Proper blood glucose monitoring techniques []  [x]  Effect of stress and stress reduction techniques  [x]  [x]  Exercise effect on blood glucose levels, appropriate exercise during  pregnancy []  []  Importance of limiting caffeine and abstaining from alcohol and smoking [x]  [x]  Medications used for blood sugar control during pregnancy [x]  []  Hypoglycemia and rule of 15 []  []  Postpartum self care   Patient instructed to monitor glucose levels: FBS: 60 - <= 95 mg/dL; 2 hour: <= 563 mg/dL  Patient received handouts: Pre-existing diabetes during pregnancy

## 2023-05-03 ENCOUNTER — Encounter: Payer: Self-pay | Admitting: Obstetrics and Gynecology

## 2023-05-09 ENCOUNTER — Other Ambulatory Visit (HOSPITAL_COMMUNITY)
Admission: RE | Admit: 2023-05-09 | Discharge: 2023-05-09 | Disposition: A | Discharge status: Home / Self Care | Payer: Medicaid Other | Source: Ambulatory Visit | Attending: Family Medicine | Admitting: Family Medicine

## 2023-05-09 ENCOUNTER — Ambulatory Visit: Payer: Medicaid Other

## 2023-05-09 ENCOUNTER — Other Ambulatory Visit: Payer: Self-pay

## 2023-05-09 ENCOUNTER — Ambulatory Visit (INDEPENDENT_AMBULATORY_CARE_PROVIDER_SITE_OTHER): Payer: Medicaid Other | Admitting: Family Medicine

## 2023-05-09 ENCOUNTER — Ambulatory Visit: Payer: Medicaid Other | Attending: Obstetrics

## 2023-05-09 VITALS — BP 108/80 | HR 93 | Wt 318.7 lb

## 2023-05-09 DIAGNOSIS — O365930 Maternal care for other known or suspected poor fetal growth, third trimester, not applicable or unspecified: Secondary | ICD-10-CM | POA: Diagnosis not present

## 2023-05-09 DIAGNOSIS — O09529 Supervision of elderly multigravida, unspecified trimester: Secondary | ICD-10-CM

## 2023-05-09 DIAGNOSIS — A5901 Trichomonal vulvovaginitis: Secondary | ICD-10-CM

## 2023-05-09 DIAGNOSIS — O24113 Pre-existing diabetes mellitus, type 2, in pregnancy, third trimester: Secondary | ICD-10-CM | POA: Insufficient documentation

## 2023-05-09 DIAGNOSIS — O10913 Unspecified pre-existing hypertension complicating pregnancy, third trimester: Secondary | ICD-10-CM | POA: Insufficient documentation

## 2023-05-09 DIAGNOSIS — O24119 Pre-existing diabetes mellitus, type 2, in pregnancy, unspecified trimester: Secondary | ICD-10-CM

## 2023-05-09 DIAGNOSIS — Z3A34 34 weeks gestation of pregnancy: Secondary | ICD-10-CM

## 2023-05-09 DIAGNOSIS — O09523 Supervision of elderly multigravida, third trimester: Secondary | ICD-10-CM

## 2023-05-09 DIAGNOSIS — O0990 Supervision of high risk pregnancy, unspecified, unspecified trimester: Secondary | ICD-10-CM

## 2023-05-09 DIAGNOSIS — O10013 Pre-existing essential hypertension complicating pregnancy, third trimester: Secondary | ICD-10-CM | POA: Diagnosis not present

## 2023-05-09 DIAGNOSIS — O0993 Supervision of high risk pregnancy, unspecified, third trimester: Secondary | ICD-10-CM

## 2023-05-09 DIAGNOSIS — Z3A36 36 weeks gestation of pregnancy: Secondary | ICD-10-CM

## 2023-05-09 DIAGNOSIS — O365990 Maternal care for other known or suspected poor fetal growth, unspecified trimester, not applicable or unspecified: Secondary | ICD-10-CM

## 2023-05-09 DIAGNOSIS — O23593 Infection of other part of genital tract in pregnancy, third trimester: Secondary | ICD-10-CM

## 2023-05-09 MED ORDER — NOVOLOG FLEXPEN 100 UNIT/ML ~~LOC~~ SOPN: 28.0000 [IU] | PEN_INJECTOR | Freq: Three times a day (TID) | SUBCUTANEOUS | 11 refills | Status: DC

## 2023-05-09 MED ORDER — INSULIN NPH (HUMAN) (ISOPHANE) 100 UNIT/ML ~~LOC~~ SUSP
48.0000 [IU] | Freq: Two times a day (BID) | SUBCUTANEOUS | 11 refills | Status: AC
Start: 2023-05-09 — End: ?

## 2023-05-09 NOTE — Progress Notes (Signed)
PRENATAL VISIT NOTE  Subjective:  Monique Schmidt is a 37 y.o. [id number] at [redacted]w[redacted]d being seen today for ongoing prenatal care.  She is currently monitored for the following issues for this high-risk pregnancy and has BMI 50.0-59.9, adult (HCC); Supervision of high risk pregnancy, antepartum; AMA (advanced maternal age) multigravida 35+, unspecified trimester; Alpha thalassemia silent carrier; Type 2 diabetes mellitus affecting pregnancy, antepartum  insulin, oral; Chronic hypertension affecting pregnancy; Uterine fibroid in pregnancy; Gonorrhea affecting pregnancy; T13/T18+ on Panorama; Fetal growth restriction antepartum; Benign gestational thrombocytopenia in third trimester (HCC); Trichomonal vaginitis during pregnancy in third trimester; and Uncontrolled diabetes mellitus with hyperglycemia (HCC) on their problem list.  Patient reports no complaints.  Contractions: Irritability. Vag. Bleeding: None.  Movement: Present. Denies leaking of fluid.   The following portions of the patient's history were reviewed and updated as appropriate: allergies, current medications, past family history, past medical history, past social history, past surgical history and problem list.   Objective:   Vitals:   05/09/23 1048  BP: 108/80  Pulse: 93  Weight: (!) 318 lb 11.2 oz (144.6 kg)    Fetal Status: Fetal Heart Rate (bpm): 138   Movement: Present     General:  Alert, oriented and cooperative. Patient is in no acute distress.  Skin: Skin is warm and dry. No rash noted.   Cardiovascular: Normal heart rate noted  Respiratory: Normal respiratory effort, no problems with respiration noted  Abdomen: Soft, gravid, appropriate for gestational age.  Pain/Pressure: Present     Pelvic: Cervical exam deferred        Extremities: Normal range of motion.  Edema: None  Mental Status: Normal mood and affect. Normal behavior. Normal judgment and thought content.   Assessment and Plan:  Pregnancy: [id number] at  [redacted]w[redacted]d 1. AMA (advanced maternal age) multigravida 35+, unspecified trimester Repeat labs done - CBC - Cervicovaginal ancillary only  2. Supervision of high risk pregnancy, antepartum   3. Type 2 diabetes mellitus affecting pregnancy, antepartum  insulin, oral Reports her BS are 78-117 2 hour pp 100-184 Increase her insulin IOL booked @ 37 weeks - insulin aspart (NOVOLOG FLEXPEN) 100 UNIT/ML FlexPen; Inject 28-30 Units into the skin 3 (three) times daily with meals.  Dispense: 15 mL; Refill: 11 - insulin NPH Human (NOVOLIN N) 100 UNIT/ML injection; Inject 0.48 mLs (48 Units total) into the skin 2 (two) times daily at 8 am and 10 pm.  Dispense: 10 mL; Refill: 11 - Ambulatory referral to Ophthalmology  4. Fetal growth restriction antepartum EFW 10%  5. Trichomonal vaginitis during pregnancy in third trimester TOC today  6. 34 weeks  Preterm labor symptoms and general obstetric precautions including but not limited to vaginal bleeding, contractions, leaking of fluid and fetal movement were reviewed in detail with the patient. Please refer to After Visit Summary for other counseling recommendations.   Return in 2 weeks (on 05/23/2023).  Future Appointments  Date Time Provider Department Center  05/13/2023 10:30 AM [facility] NURSE [facility]  05/13/2023 10:45 AM [facility] NST [facility]  05/16/2023  9:15 AM [facility] NURSE [facility]  05/16/2023  9:30 AM [facility] US6 [facility]  05/16/2023 12:00 PM Bonnita Levan, RD [facility] NDM  05/20/2023  8:30 AM [facility] NURSE [facility]  05/20/2023  8:45 AM [facility] NST [facility]  05/20/2023 10:15 AM Adam Phenix, MD [facility]  05/23/2023  8:15 AM [facility] NURSE [facility]  05/23/2023  8:30 AM [facility] US1 [facility]  05/27/2023 10:30 AM  [facility] NURSE [facility]  05/27/2023 10:45 AM [facility] NST [facility]  05/29/2023  7:15 AM MC-LD SCHED ROOM [facility]-INDC None  05/30/2023  9:15 AM [facility] NURSE [facility]  05/30/2023  9:30 AM [facility] US5  [facility]    Reva Bores, MD

## 2023-05-10 LAB — CERVICOVAGINAL ANCILLARY ONLY
Bacterial Vaginitis (gardnerella): NEGATIVE
Candida Glabrata: NEGATIVE
Candida Vaginitis: NEGATIVE
Chlamydia: NEGATIVE
Comment: NEGATIVE
Comment: NEGATIVE
Comment: NEGATIVE
Comment: NEGATIVE
Comment: NEGATIVE
Comment: NORMAL
Neisseria Gonorrhea: NEGATIVE
Trichomonas: NEGATIVE

## 2023-05-11 ENCOUNTER — Encounter: Payer: Self-pay | Admitting: Family Medicine

## 2023-05-11 LAB — CBC
Hematocrit: 41.1 % (ref 34.0–46.6)
Hemoglobin: 13.3 g/dL (ref 11.1–15.9)
MCH: 27.4 pg (ref 26.6–33.0)
MCHC: 32.4 g/dL (ref 31.5–35.7)
MCV: 85 fL (ref 79–97)
Platelets: 89 10*3/uL — CL (ref 150–450)
RBC: 4.86 x10E6/uL (ref 3.77–5.28)
RDW: 14.3 % (ref 11.7–15.4)
WBC: 7.9 10*3/uL (ref 3.4–10.8)

## 2023-05-13 ENCOUNTER — Ambulatory Visit: Payer: Medicaid Other | Admitting: *Deleted

## 2023-05-13 ENCOUNTER — Ambulatory Visit: Payer: Medicaid Other | Attending: Maternal & Fetal Medicine | Admitting: *Deleted

## 2023-05-13 VITALS — BP 107/82 | HR 94

## 2023-05-13 DIAGNOSIS — E1169 Type 2 diabetes mellitus with other specified complication: Secondary | ICD-10-CM | POA: Diagnosis not present

## 2023-05-13 DIAGNOSIS — O365930 Maternal care for other known or suspected poor fetal growth, third trimester, not applicable or unspecified: Secondary | ICD-10-CM

## 2023-05-13 DIAGNOSIS — Z3A34 34 weeks gestation of pregnancy: Secondary | ICD-10-CM | POA: Diagnosis not present

## 2023-05-13 DIAGNOSIS — O99213 Obesity complicating pregnancy, third trimester: Secondary | ICD-10-CM | POA: Diagnosis not present

## 2023-05-13 DIAGNOSIS — O98213 Gonorrhea complicating pregnancy, third trimester: Secondary | ICD-10-CM

## 2023-05-13 DIAGNOSIS — O0990 Supervision of high risk pregnancy, unspecified, unspecified trimester: Secondary | ICD-10-CM

## 2023-05-13 DIAGNOSIS — O09523 Supervision of elderly multigravida, third trimester: Secondary | ICD-10-CM

## 2023-05-13 DIAGNOSIS — O24113 Pre-existing diabetes mellitus, type 2, in pregnancy, third trimester: Secondary | ICD-10-CM

## 2023-05-13 DIAGNOSIS — O10913 Unspecified pre-existing hypertension complicating pregnancy, third trimester: Secondary | ICD-10-CM

## 2023-05-13 DIAGNOSIS — O10013 Pre-existing essential hypertension complicating pregnancy, third trimester: Secondary | ICD-10-CM | POA: Diagnosis not present

## 2023-05-13 NOTE — Procedures (Cosign Needed Addendum)
Monique Schmidt 16-Nov-1985 [redacted]w[redacted]d  Fetus A Non-Stress Test Interpretation for 05/13/23 (NST only)  Indication: IUGR, Chronic Hypertenstion, Diabetes, Advanced Maternal Age >40 years, and Morbid Obesity  Fetal Heart Rate A Mode: External Baseline Rate (A): 130 bpm Variability: Moderate Accelerations: 15 x 15 Decelerations: None Multiple birth?: No  Uterine Activity Mode: Palpation, Toco Contraction Frequency (min): Occas UI Resting Tone Palpated: Relaxed Resting Time: Adequate  Interpretation (Fetal Testing) Nonstress Test Interpretation: Reactive Comments: Dr. Darra Lis reviewed tracing.

## 2023-05-14 ENCOUNTER — Other Ambulatory Visit: Payer: Self-pay

## 2023-05-15 ENCOUNTER — Other Ambulatory Visit: Payer: Self-pay

## 2023-05-16 ENCOUNTER — Ambulatory Visit: Payer: Medicaid Other | Admitting: Dietician

## 2023-05-16 ENCOUNTER — Ambulatory Visit: Payer: Medicaid Other | Admitting: *Deleted

## 2023-05-16 ENCOUNTER — Other Ambulatory Visit: Payer: Self-pay

## 2023-05-16 ENCOUNTER — Ambulatory Visit: Payer: Medicaid Other | Attending: Obstetrics

## 2023-05-16 VITALS — BP 129/82 | HR 96

## 2023-05-16 DIAGNOSIS — O365930 Maternal care for other known or suspected poor fetal growth, third trimester, not applicable or unspecified: Secondary | ICD-10-CM | POA: Insufficient documentation

## 2023-05-16 DIAGNOSIS — E669 Obesity, unspecified: Secondary | ICD-10-CM

## 2023-05-16 DIAGNOSIS — O10013 Pre-existing essential hypertension complicating pregnancy, third trimester: Secondary | ICD-10-CM | POA: Diagnosis not present

## 2023-05-16 DIAGNOSIS — O98213 Gonorrhea complicating pregnancy, third trimester: Secondary | ICD-10-CM

## 2023-05-16 DIAGNOSIS — E1165 Type 2 diabetes mellitus with hyperglycemia: Secondary | ICD-10-CM | POA: Diagnosis not present

## 2023-05-16 DIAGNOSIS — Z3A35 35 weeks gestation of pregnancy: Secondary | ICD-10-CM

## 2023-05-16 DIAGNOSIS — O10913 Unspecified pre-existing hypertension complicating pregnancy, third trimester: Secondary | ICD-10-CM | POA: Diagnosis present

## 2023-05-16 DIAGNOSIS — O0990 Supervision of high risk pregnancy, unspecified, unspecified trimester: Secondary | ICD-10-CM | POA: Insufficient documentation

## 2023-05-16 DIAGNOSIS — O24113 Pre-existing diabetes mellitus, type 2, in pregnancy, third trimester: Secondary | ICD-10-CM | POA: Insufficient documentation

## 2023-05-16 DIAGNOSIS — O99213 Obesity complicating pregnancy, third trimester: Secondary | ICD-10-CM | POA: Diagnosis not present

## 2023-05-16 DIAGNOSIS — O09523 Supervision of elderly multigravida, third trimester: Secondary | ICD-10-CM

## 2023-05-16 DIAGNOSIS — Z794 Long term (current) use of insulin: Secondary | ICD-10-CM | POA: Diagnosis not present

## 2023-05-17 ENCOUNTER — Encounter: Payer: Self-pay | Admitting: *Deleted

## 2023-05-17 ENCOUNTER — Other Ambulatory Visit: Payer: Self-pay

## 2023-05-19 ENCOUNTER — Encounter (HOSPITAL_COMMUNITY): Payer: Self-pay | Admitting: Obstetrics & Gynecology

## 2023-05-19 ENCOUNTER — Inpatient Hospital Stay (EMERGENCY_DEPARTMENT_HOSPITAL)
Admission: AD | Admit: 2023-05-19 | Discharge: 2023-05-19 | Disposition: A | Discharge status: Home / Self Care | Payer: Medicaid Other | Source: Home / Self Care | Attending: Obstetrics & Gynecology | Admitting: Obstetrics & Gynecology

## 2023-05-19 DIAGNOSIS — O1203 Gestational edema, third trimester: Secondary | ICD-10-CM | POA: Insufficient documentation

## 2023-05-19 DIAGNOSIS — Z794 Long term (current) use of insulin: Secondary | ICD-10-CM | POA: Insufficient documentation

## 2023-05-19 DIAGNOSIS — O24113 Pre-existing diabetes mellitus, type 2, in pregnancy, third trimester: Secondary | ICD-10-CM | POA: Insufficient documentation

## 2023-05-19 DIAGNOSIS — I10 Essential (primary) hypertension: Secondary | ICD-10-CM

## 2023-05-19 DIAGNOSIS — O10113 Pre-existing hypertensive heart disease complicating pregnancy, third trimester: Secondary | ICD-10-CM | POA: Insufficient documentation

## 2023-05-19 DIAGNOSIS — G4489 Other headache syndrome: Secondary | ICD-10-CM | POA: Diagnosis not present

## 2023-05-19 DIAGNOSIS — Z3A35 35 weeks gestation of pregnancy: Secondary | ICD-10-CM | POA: Insufficient documentation

## 2023-05-19 DIAGNOSIS — O479 False labor, unspecified: Secondary | ICD-10-CM | POA: Diagnosis not present

## 2023-05-19 LAB — URINALYSIS, ROUTINE W REFLEX MICROSCOPIC
Bilirubin Urine: NEGATIVE
Glucose, UA: NEGATIVE mg/dL
Hgb urine dipstick: NEGATIVE
Ketones, ur: NEGATIVE mg/dL
Leukocytes,Ua: NEGATIVE
Nitrite: NEGATIVE
Protein, ur: NEGATIVE mg/dL
Specific Gravity, Urine: 1.018 (ref 1.005–1.030)
pH: 7 (ref 5.0–8.0)

## 2023-05-19 LAB — CBC
HCT: 39.7 % (ref 36.0–46.0)
Hemoglobin: 13.2 g/dL (ref 12.0–15.0)
MCH: 27.8 pg (ref 26.0–34.0)
MCHC: 33.2 g/dL (ref 30.0–36.0)
MCV: 83.6 fL (ref 80.0–100.0)
Platelets: 98 10*3/uL — ABNORMAL LOW (ref 150–400)
RBC: 4.75 MIL/uL (ref 3.87–5.11)
RDW: 14.9 % (ref 11.5–15.5)
WBC: 8.4 10*3/uL (ref 4.0–10.5)
nRBC: 0 % (ref 0.0–0.2)

## 2023-05-19 LAB — COMPREHENSIVE METABOLIC PANEL
ALT: 19 U/L (ref 0–44)
AST: 26 U/L (ref 15–41)
Albumin: 2.3 g/dL — ABNORMAL LOW (ref 3.5–5.0)
Alkaline Phosphatase: 94 U/L (ref 38–126)
Anion gap: 8 (ref 5–15)
BUN: 10 mg/dL (ref 6–20)
CO2: 25 mmol/L (ref 22–32)
Calcium: 8.5 mg/dL — ABNORMAL LOW (ref 8.9–10.3)
Chloride: 103 mmol/L (ref 98–111)
Creatinine, Ser: 0.55 mg/dL (ref 0.44–1.00)
GFR, Estimated: >60 mL/min (ref >60)
Glucose, Bld: 96 mg/dL (ref 70–99)
Potassium: 3.3 mmol/L — ABNORMAL LOW (ref 3.5–5.1)
Sodium: 136 mmol/L (ref 135–145)
Total Bilirubin: 0.5 mg/dL (ref 0.3–1.2)
Total Protein: 6.2 g/dL — ABNORMAL LOW (ref 6.5–8.1)

## 2023-05-19 LAB — PROTEIN / CREATININE RATIO, URINE
Creatinine, Urine: 99 mg/dL
Protein Creatinine Ratio: 0.1 mg/mg{creat} (ref 0.00–0.15)
Total Protein, Urine: 10 mg/dL

## 2023-05-19 MED ORDER — ACETAMINOPHEN 500 MG PO TABS
1000.0000 mg | ORAL_TABLET | Freq: Once | ORAL | Status: AC
  Administered 2023-05-19: 1000 mg via ORAL
  Filled 2023-05-19: qty 2

## 2023-05-19 NOTE — Discharge Instructions (Addendum)
For compression socks, go to [email or web address]

## 2023-05-19 NOTE — MAU Note (Signed)
..  Monique Schmidt is a 37 y.o. at [redacted]w[redacted]d here in MAU reporting: took blood pressure at home and was elevated. EMS reports blood pressure on truck 140/106 Headache that began 2 days ago has not taken anything for it  Swelling that began yesterday  No visual changes or epigastric +FM Contractions irregular  Irregular contractions  Vitals:   05/19/23 0058 05/19/23 0100  BP: 128/78 128/78  Pulse: 94 98  Resp: 18   Temp: 98.7 F (37.1 C)   SpO2: 100%      ZOX:[id number] in room  Lab orders placed from triage:  UA

## 2023-05-19 NOTE — MAU Provider Note (Signed)
Chief Complaint:  Headache and Hypertension   Event Date/Time   First Provider Initiated Contact with Patient 05/19/23 0130      HPI: Monique Schmidt is a 37 y.o. [id number] at [redacted]w[redacted]d who presents to maternity admissions reporting elevated BP at home, 170s/100s, retake 150s/100s. She reports h/a x 2 days. She has not tried any treatments for the h/a.  She has swelling in her ankles, feet that started yesterday.  There is mild abdominal cramping x several weeks, unchanged today.  She reports good fetal movement.    HPI  Past Medical History: Past Medical History:  Diagnosis Date   Abnormal maternal serum screening test 02/26/2023   HR due to low FF     Abnormal Pap smear    Bacterial vaginosis    Gestational diabetes    likely pre-exisiting; all pregnancies glyburide started 11/4   Gestational thrombocytopenia (HCC)    Headache(784.0)    Hernia, umbilical    History of abnormal cervical Pap smear    normal 2014   History of gestational diabetes 06/26/2019   Mild preeclampsia delivered 01/22/2016   Moderate persistent asthma 08/07/2013   Obesity complicating pregnancy 09/21/2013   Type 2 diabetes mellitus (HCC)     Past obstetric history: OB History  Gravida Para Term Preterm AB Living  7 5 5   1 5   SAB IAB Ectopic Multiple Live Births  1 0 0 0 5    # Outcome Date GA Lbr Len/2nd Weight Sex Type Anes PTL Lv  7 Current           6 Term 01/20/16 [redacted]w[redacted]d 00:55 / 00:08 4414 g F Vag-Spont None  LIV     Birth Comments: none  5 Term 12/15/13 [redacted]w[redacted]d 06:43 / 00:02 4020 g F Vag-Spont None  LIV  4 Term 06/26/12 [redacted]w[redacted]d 21:07 / 00:01 3232 g F Vag-Spont None  LIV  3 Term 03/08/11 [redacted]w[redacted]d 01:27 / 00:02 3884 g F Vag-Spont None  LIV     Birth Comments: none  2 Term 05/19/09 [redacted]w[redacted]d   F Vag-Spont None N LIV  1 SAB             Past Surgical History: Past Surgical History:  Procedure Laterality Date   UMBILICAL HERNIA REPAIR N/A 11/22/2014   Procedure: LAPAROSCOPIC UMBILICAL HERNIA;  Surgeon:  Axel Filler, MD;  Location: [facility] OR;  Service: General;  Laterality: N/A;   WISDOM TOOTH EXTRACTION      Family History: Family History  Problem Relation Age of Onset   Hypertension Father    Diabetes Father    Diabetes Mother    Seizures Brother     Social History: Social History   Tobacco Use   Smoking status: Never   Smokeless tobacco: Never  Vaping Use   Vaping status: Never Used  Substance Use Topics   Alcohol use: No   Drug use: No    Allergies:  Allergies  Allergen Reactions   Latex Itching and Rash   Penicillins Itching and Rash    Did it involve swelling of the face/tongue/throat, SOB, or low BP? N Did it involve sudden or severe rash/hives, skin peeling, or any reaction on the inside of your mouth or nose? Y Did you need to seek medical attention at a hospital or doctor's office? No When did it last happen?  Several  Years Ago    If all above answers are "NO", may proceed with cephalosporin use.        Meds:  Medications Prior to Admission  Medication Sig Dispense Refill Last Dose   Accu-Chek Softclix Lancets lancets Use four times per day as instructed 100 each 12    acetaminophen (TYLENOL) 500 MG tablet Take 2 tablets (1,000 mg total) by mouth every 6 (six) hours as needed for fever, headache or mild pain. 30 tablet 0    albuterol (VENTOLIN HFA) 108 (90 Base) MCG/ACT inhaler Inhale 1-2 puffs into the lungs every 6 (six) hours as needed for wheezing or shortness of breath. 18 g 1    aspirin EC 81 MG tablet Take 2 tablets (162 mg total) by mouth daily. Start taking when you are [redacted] weeks pregnant for rest of pregnancy for prevention of preeclampsia 300 tablet 2    BD PEN NEEDLE NANO 2ND GEN 32G X 4 MM MISC Inject into the skin 2 (two) times daily.      blood glucose meter kit and supplies KIT Dispense based on patient and insurance preference. Use up to four times daily as directed. 1 each 0    Continuous Glucose Sensor (DEXCOM G7 SENSOR) MISC Replace  sensor every 10 days 3 each 5    cyclobenzaprine (FLEXERIL) 10 MG tablet Take 1 tablet (10 mg total) by mouth every 8 (eight) hours as needed for muscle spasms. (Patient not taking: Reported on 05/09/2023) 30 tablet 0    glucose blood (ACCU-CHEK GUIDE) test strip Use four times per day as instructed 100 each 12    insulin aspart (NOVOLOG FLEXPEN) 100 UNIT/ML FlexPen Inject 28-30 Units into the skin 3 (three) times daily with meals. 15 mL 11    insulin NPH Human (NOVOLIN N) 100 UNIT/ML injection Inject 0.48 mLs (48 Units total) into the skin 2 (two) times daily at 8 am and 10 pm. 10 mL 11    Insulin Syringe-Needle U-100 (INSULIN SYRINGE 1CC/31GX5/16") 31G X 5/16" 1 ML MISC Use 2 (two) times daily. 100 each 6    NIFEdipine (PROCARDIA XL) 30 MG 24 hr tablet Take 1 tablet (30 mg total) by mouth daily. 30 tablet 6    Prenatal Vit-Fe Phos-FA-Omega (VITAFOL GUMMIES) 3.33-0.333-34.8 MG CHEW Chew 3 tablets by mouth daily. 90 tablet 11     ROS:  Review of Systems  Constitutional:  Negative for chills, fatigue and fever.  Eyes:  Negative for visual disturbance.  Respiratory:  Negative for shortness of breath.   Cardiovascular:  Negative for chest pain.  Gastrointestinal:  Negative for abdominal pain, nausea and vomiting.  Genitourinary:  Negative for difficulty urinating, dysuria, flank pain, pelvic pain, vaginal bleeding, vaginal discharge and vaginal pain.  Neurological:  Positive for headaches. Negative for dizziness.  Psychiatric/Behavioral: Negative.       I have reviewed patient's Past Medical Hx, Surgical Hx, Family Hx, Social Hx, medications and allergies.   Physical Exam  Patient Vitals for the past 24 hrs:  BP Temp Temp src Pulse Resp SpO2  05/19/23 0250 -- -- -- -- -- 100 %  05/19/23 0247 (!) 117/54 -- -- 90 -- --  05/19/23 0245 -- -- -- -- -- 99 %  05/19/23 0240 -- -- -- -- -- 99 %  05/19/23 0235 -- -- -- -- -- 98 %  05/19/23 0232 123/74 -- -- 92 -- --  05/19/23 0217 109/63 -- -- 87  -- --  05/19/23 0216 109/63 -- -- 87 -- --  05/19/23 0210 -- -- -- -- -- 97 %  05/19/23 0206 -- -- -- -- -- 99 %  05/19/23 0205 -- -- -- -- --  99 %  05/19/23 0202 128/70 -- -- 91 -- --  05/19/23 0200 -- -- -- -- -- 98 %  05/19/23 0155 -- -- -- -- -- 100 %  05/19/23 0150 -- -- -- -- -- 100 %  05/19/23 0147 135/74 -- -- 88 -- --  05/19/23 0145 -- -- -- -- -- 99 %  05/19/23 0141 -- -- -- -- -- 100 %  05/19/23 0140 -- -- -- -- -- 100 %  05/19/23 0135 -- -- -- -- -- 100 %  05/19/23 0132 129/74 -- -- 92 -- --  05/19/23 0130 -- -- -- -- -- 99 %  05/19/23 0125 -- -- -- -- -- 99 %  05/19/23 0120 -- -- -- -- -- 100 %  05/19/23 0116 132/72 -- -- 91 -- --  05/19/23 0115 -- -- -- -- -- 99 %  05/19/23 0110 -- -- -- -- -- 99 %  05/19/23 0105 -- -- -- -- -- 98 %  05/19/23 0100 128/78 -- -- 98 -- 99 %  05/19/23 0058 128/78 98.7 F (37.1 C) Oral 94 18 100 %   Constitutional: Well-developed, well-nourished female in no acute distress.  Cardiovascular: normal rate Respiratory: normal effort GI: Abd soft, non-tender, gravid appropriate for gestational age.  MS: Extremities nontender, no edema, normal ROM Neurologic: Alert and oriented x 4.  GU: Neg CVAT.  PELVIC EXAM: Deferred     FHT:  Baseline 135 , moderate variability, accelerations present, no decelerations Contractions: none on toco or to palpation   Labs: Results for orders placed or performed during the hospital encounter of 05/19/23 (from the past 24 hour(s))  Protein / creatinine ratio, urine     Status: None   Collection Time: 05/19/23  2:51 AM  Result Value Ref Range   Creatinine, Urine 99 mg/dL   Total Protein, Urine 10 mg/dL   Protein Creatinine Ratio 0.10 0.00 - 0.15 mg/mg[Cre]  Urinalysis, Routine w reflex microscopic -Urine, Clean Catch     Status: None   Collection Time: 05/19/23  2:51 AM  Result Value Ref Range   Color, Urine YELLOW YELLOW   APPearance CLEAR CLEAR   Specific Gravity, Urine 1.018 1.005 - 1.030   pH  7.0 5.0 - 8.0   Glucose, UA NEGATIVE NEGATIVE mg/dL   Hgb urine dipstick NEGATIVE NEGATIVE   Bilirubin Urine NEGATIVE NEGATIVE   Ketones, ur NEGATIVE NEGATIVE mg/dL   Protein, ur NEGATIVE NEGATIVE mg/dL   Nitrite NEGATIVE NEGATIVE   Leukocytes,Ua NEGATIVE NEGATIVE  CBC     Status: Abnormal   Collection Time: 05/19/23  2:59 AM  Result Value Ref Range   WBC 8.4 4.0 - 10.5 K/uL   RBC 4.75 3.87 - 5.11 MIL/uL   Hemoglobin 13.2 12.0 - 15.0 g/dL   HCT 96.2 95.2 - 84.1 %   MCV 83.6 80.0 - 100.0 fL   MCH 27.8 26.0 - 34.0 pg   MCHC 33.2 30.0 - 36.0 g/dL   RDW 32.4 40.1 - 02.7 %   Platelets 98 (L) 150 - 400 K/uL   nRBC 0.0 0.0 - 0.2 %  Comprehensive metabolic panel     Status: Abnormal   Collection Time: 05/19/23  2:59 AM  Result Value Ref Range   Sodium 136 135 - 145 mmol/L   Potassium 3.3 (L) 3.5 - 5.1 mmol/L   Chloride 103 98 - 111 mmol/L   CO2 25 22 - 32 mmol/L   Glucose, Bld 96 70 - 99 mg/dL   BUN 10  6 - 20 mg/dL   Creatinine, Ser 1.61 0.44 - 1.00 mg/dL   Calcium 8.5 (L) 8.9 - 10.3 mg/dL   Total Protein 6.2 (L) 6.5 - 8.1 g/dL   Albumin 2.3 (L) 3.5 - 5.0 g/dL   AST 26 15 - 41 U/L   ALT 19 0 - 44 U/L   Alkaline Phosphatase 94 38 - 126 U/L   Total Bilirubin 0.5 0.3 - 1.2 mg/dL   GFR, Estimated >09 >60 mL/min   Anion gap 8 5 - 15   --/--/A POS (09/06 1457)  Imaging:   MAU Course/MDM: Orders Placed This Encounter  Procedures   CBC   Comprehensive metabolic panel   Protein / creatinine ratio, urine   Urinalysis, Routine w reflex microscopic -Urine, Clean Catch   Discharge patient    Meds ordered this encounter  Medications   acetaminophen (TYLENOL) tablet 1,000 mg     NST reviewed and reactive Pt normotensive in MAU, PEC labs done given elevated BPs at home and are wnl, except Plts are 98 (up from 89 on 9/19) H/a improved with Tylenol D/C home with PEC precautions Keep scheduled appts at [facility] Recommend elevation, compression for edema Return to MAU as needed for  emergencies   Assessment: 1. Chronic hypertension   2. Edema during pregnancy in third trimester   3. [redacted] weeks gestation of pregnancy     Plan: Discharge home Labor precautions and fetal kick counts  Follow-up Information     [facility] Follow up.   Specialty: Obstetrics and Gynecology Why: As scheduled Contact information: [address] [place] [address] [phone]        [facility] Follow up.   Specialty: Obstetrics and Gynecology Contact information: [address] [place] [address] [phone]               Allergies as of 05/19/2023       Reactions   Latex Itching, Rash   Penicillins Itching, Rash   Did it involve swelling of the face/tongue/throat, SOB, or low BP? N Did it involve sudden or severe rash/hives, skin peeling, or any reaction on the inside of your mouth or nose? Y Did you need to seek medical attention at a hospital or doctor's office? No When did it last happen?  Several  Years Ago    If all above answers are "NO", may proceed with cephalosporin use.        Medication List     TAKE these medications    Accu-Chek Guide test strip Generic drug: glucose blood Use four times per day as instructed   Accu-Chek Softclix Lancets lancets Use four times per day as instructed   acetaminophen 500 MG tablet Commonly known as: TYLENOL Take 2 tablets (1,000 mg total) by mouth every 6 (six) hours as needed for fever, headache or mild pain.   aspirin EC 81 MG tablet Take 2 tablets (162 mg total) by mouth daily. Start taking when you are [redacted] weeks pregnant for rest of pregnancy for prevention of preeclampsia   BD Insulin Syringe U/F 31G X 5/16" 1 ML Misc Generic drug: Insulin Syringe-Needle U-100 Use 2 (two) times daily.   BD Pen Needle Nano 2nd Gen 32G X 4 MM Misc Generic drug: Insulin Pen Needle Inject into the skin 2  (two) times daily.   blood glucose meter kit and supplies Kit Dispense based on patient and insurance preference. Use  up to four times daily as directed.   cyclobenzaprine 10 MG tablet Commonly known as: FLEXERIL Take 1 tablet (10 mg total) by mouth every 8 (eight) hours as needed for muscle spasms.   Dexcom G7 Sensor Misc Replace sensor every 10 days   insulin NPH Human 100 UNIT/ML injection Commonly known as: NOVOLIN N Inject 0.48 mLs (48 Units total) into the skin 2 (two) times daily at 8 am and 10 pm.   NIFEdipine 30 MG 24 hr tablet Commonly known as: Procardia XL Take 1 tablet (30 mg total) by mouth daily.   NovoLOG FlexPen 100 UNIT/ML FlexPen Generic drug: insulin aspart Inject 28-30 Units into the skin 3 (three) times daily with meals.   Ventolin HFA 108 (90 Base) MCG/ACT inhaler Generic drug: albuterol Inhale 1-2 puffs into the lungs every 6 (six) hours as needed for wheezing or shortness of breath.   Vitafol Gummies 3.33-0.333-34.8 MG Chew Chew 3 tablets by mouth daily.        Sharen Counter Certified Nurse-Midwife 05/19/2023 4:10 AM

## 2023-05-20 ENCOUNTER — Ambulatory Visit: Payer: Medicaid Other | Admitting: *Deleted

## 2023-05-20 ENCOUNTER — Inpatient Hospital Stay (HOSPITAL_COMMUNITY): Payer: Medicaid Other

## 2023-05-20 ENCOUNTER — Inpatient Hospital Stay (HOSPITAL_COMMUNITY)
Admission: AD | Admit: 2023-05-20 | Discharge: 2023-05-23 | DRG: 805 | Disposition: A | Discharge status: Home / Self Care | Payer: Medicaid Other | Attending: Obstetrics and Gynecology | Admitting: Obstetrics and Gynecology

## 2023-05-20 ENCOUNTER — Encounter (HOSPITAL_COMMUNITY): Payer: Self-pay | Admitting: Obstetrics & Gynecology

## 2023-05-20 ENCOUNTER — Other Ambulatory Visit: Payer: Self-pay

## 2023-05-20 ENCOUNTER — Ambulatory Visit: Payer: Medicaid Other | Attending: Obstetrics | Admitting: *Deleted

## 2023-05-20 ENCOUNTER — Encounter: Payer: Self-pay | Admitting: Family Medicine

## 2023-05-20 ENCOUNTER — Encounter: Payer: Medicaid Other | Admitting: Obstetrics & Gynecology

## 2023-05-20 ENCOUNTER — Ambulatory Visit: Payer: Medicaid Other | Attending: Maternal & Fetal Medicine | Admitting: Maternal & Fetal Medicine

## 2023-05-20 VITALS — BP 124/75

## 2023-05-20 VITALS — BP 131/86 | HR 91

## 2023-05-20 DIAGNOSIS — O99214 Obesity complicating childbirth: Secondary | ICD-10-CM | POA: Diagnosis present

## 2023-05-20 DIAGNOSIS — O09523 Supervision of elderly multigravida, third trimester: Secondary | ICD-10-CM | POA: Insufficient documentation

## 2023-05-20 DIAGNOSIS — O10113 Pre-existing hypertensive heart disease complicating pregnancy, third trimester: Secondary | ICD-10-CM | POA: Diagnosis present

## 2023-05-20 DIAGNOSIS — O24119 Pre-existing diabetes mellitus, type 2, in pregnancy, unspecified trimester: Secondary | ICD-10-CM

## 2023-05-20 DIAGNOSIS — O288 Other abnormal findings on antenatal screening of mother: Secondary | ICD-10-CM | POA: Diagnosis not present

## 2023-05-20 DIAGNOSIS — O9952 Diseases of the respiratory system complicating childbirth: Secondary | ICD-10-CM | POA: Diagnosis present

## 2023-05-20 DIAGNOSIS — Z3A35 35 weeks gestation of pregnancy: Secondary | ICD-10-CM

## 2023-05-20 DIAGNOSIS — E119 Type 2 diabetes mellitus without complications: Secondary | ICD-10-CM | POA: Diagnosis present

## 2023-05-20 DIAGNOSIS — Z6841 Body Mass Index (BMI) 40.0 and over, adult: Secondary | ICD-10-CM

## 2023-05-20 DIAGNOSIS — O2412 Pre-existing diabetes mellitus, type 2, in childbirth: Secondary | ICD-10-CM | POA: Diagnosis present

## 2023-05-20 DIAGNOSIS — O9912 Other diseases of the blood and blood-forming organs and certain disorders involving the immune mechanism complicating childbirth: Secondary | ICD-10-CM | POA: Diagnosis present

## 2023-05-20 DIAGNOSIS — O24424 Gestational diabetes mellitus in childbirth, insulin controlled: Secondary | ICD-10-CM | POA: Diagnosis not present

## 2023-05-20 DIAGNOSIS — O1203 Gestational edema, third trimester: Secondary | ICD-10-CM | POA: Diagnosis present

## 2023-05-20 DIAGNOSIS — O10013 Pre-existing essential hypertension complicating pregnancy, third trimester: Secondary | ICD-10-CM | POA: Insufficient documentation

## 2023-05-20 DIAGNOSIS — Z794 Long term (current) use of insulin: Secondary | ICD-10-CM | POA: Diagnosis not present

## 2023-05-20 DIAGNOSIS — J4540 Moderate persistent asthma, uncomplicated: Secondary | ICD-10-CM | POA: Diagnosis present

## 2023-05-20 DIAGNOSIS — Z91040 Latex allergy status: Secondary | ICD-10-CM | POA: Diagnosis not present

## 2023-05-20 DIAGNOSIS — O368390 Maternal care for abnormalities of the fetal heart rate or rhythm, unspecified trimester, not applicable or unspecified: Secondary | ICD-10-CM

## 2023-05-20 DIAGNOSIS — O1092 Unspecified pre-existing hypertension complicating childbirth: Secondary | ICD-10-CM | POA: Diagnosis present

## 2023-05-20 DIAGNOSIS — E669 Obesity, unspecified: Secondary | ICD-10-CM

## 2023-05-20 DIAGNOSIS — Z79899 Other long term (current) drug therapy: Secondary | ICD-10-CM

## 2023-05-20 DIAGNOSIS — Z820 Family history of epilepsy and other diseases of the nervous system: Secondary | ICD-10-CM | POA: Diagnosis not present

## 2023-05-20 DIAGNOSIS — O365930 Maternal care for other known or suspected poor fetal growth, third trimester, not applicable or unspecified: Secondary | ICD-10-CM | POA: Insufficient documentation

## 2023-05-20 DIAGNOSIS — O99213 Obesity complicating pregnancy, third trimester: Secondary | ICD-10-CM

## 2023-05-20 DIAGNOSIS — D696 Thrombocytopenia, unspecified: Secondary | ICD-10-CM | POA: Diagnosis present

## 2023-05-20 DIAGNOSIS — O368130 Decreased fetal movements, third trimester, not applicable or unspecified: Secondary | ICD-10-CM | POA: Diagnosis not present

## 2023-05-20 DIAGNOSIS — O24113 Pre-existing diabetes mellitus, type 2, in pregnancy, third trimester: Secondary | ICD-10-CM | POA: Insufficient documentation

## 2023-05-20 DIAGNOSIS — O10919 Unspecified pre-existing hypertension complicating pregnancy, unspecified trimester: Secondary | ICD-10-CM | POA: Diagnosis present

## 2023-05-20 DIAGNOSIS — Z148 Genetic carrier of other disease: Secondary | ICD-10-CM | POA: Diagnosis not present

## 2023-05-20 DIAGNOSIS — Z8249 Family history of ischemic heart disease and other diseases of the circulatory system: Secondary | ICD-10-CM | POA: Diagnosis not present

## 2023-05-20 DIAGNOSIS — O0990 Supervision of high risk pregnancy, unspecified, unspecified trimester: Secondary | ICD-10-CM

## 2023-05-20 DIAGNOSIS — O98213 Gonorrhea complicating pregnancy, third trimester: Secondary | ICD-10-CM

## 2023-05-20 DIAGNOSIS — Z823 Family history of stroke: Secondary | ICD-10-CM | POA: Diagnosis not present

## 2023-05-20 DIAGNOSIS — I10 Essential (primary) hypertension: Secondary | ICD-10-CM | POA: Diagnosis not present

## 2023-05-20 DIAGNOSIS — Z833 Family history of diabetes mellitus: Secondary | ICD-10-CM

## 2023-05-20 DIAGNOSIS — O10913 Unspecified pre-existing hypertension complicating pregnancy, third trimester: Secondary | ICD-10-CM

## 2023-05-20 DIAGNOSIS — Z880 Allergy status to penicillin: Secondary | ICD-10-CM

## 2023-05-20 DIAGNOSIS — O1002 Pre-existing essential hypertension complicating childbirth: Secondary | ICD-10-CM | POA: Diagnosis not present

## 2023-05-20 DIAGNOSIS — O365990 Maternal care for other known or suspected poor fetal growth, unspecified trimester, not applicable or unspecified: Secondary | ICD-10-CM | POA: Diagnosis present

## 2023-05-20 LAB — URINALYSIS, ROUTINE W REFLEX MICROSCOPIC
Bilirubin Urine: NEGATIVE
Glucose, UA: NEGATIVE mg/dL
Hgb urine dipstick: NEGATIVE
Ketones, ur: NEGATIVE mg/dL
Nitrite: NEGATIVE
Protein, ur: NEGATIVE mg/dL
Specific Gravity, Urine: 1.013 (ref 1.005–1.030)
pH: 6 (ref 5.0–8.0)

## 2023-05-20 LAB — CBC WITH DIFFERENTIAL/PLATELET
Abs Immature Granulocytes: 0 10*3/uL (ref 0.00–0.07)
Basophils Absolute: 0 10*3/uL (ref 0.0–0.1)
Basophils Relative: 0 %
Eosinophils Absolute: 0.4 10*3/uL (ref 0.0–0.5)
Eosinophils Relative: 4 %
HCT: 42.1 % (ref 36.0–46.0)
Hemoglobin: 14.1 g/dL (ref 12.0–15.0)
Lymphocytes Relative: 35 %
Lymphs Abs: 3.2 10*3/uL (ref 0.7–4.0)
MCH: 27.9 pg (ref 26.0–34.0)
MCHC: 33.5 g/dL (ref 30.0–36.0)
MCV: 83.2 fL (ref 80.0–100.0)
Monocytes Absolute: 0.5 10*3/uL (ref 0.1–1.0)
Monocytes Relative: 5 %
Neutro Abs: 5 10*3/uL (ref 1.7–7.7)
Neutrophils Relative %: 56 %
Platelets: 104 10*3/uL — ABNORMAL LOW (ref 150–400)
RBC: 5.06 MIL/uL (ref 3.87–5.11)
RDW: 15.1 % (ref 11.5–15.5)
WBC: 9 10*3/uL (ref 4.0–10.5)
nRBC: 0 % (ref 0.0–0.2)
nRBC: 0 /100{WBCs}

## 2023-05-20 LAB — CBC
HCT: 40.3 % (ref 36.0–46.0)
Hemoglobin: 13.2 g/dL (ref 12.0–15.0)
MCH: 26.8 pg (ref 26.0–34.0)
MCHC: 32.8 g/dL (ref 30.0–36.0)
MCV: 81.9 fL (ref 80.0–100.0)
Platelets: 106 10*3/uL — ABNORMAL LOW (ref 150–400)
RBC: 4.92 MIL/uL (ref 3.87–5.11)
RDW: 15 % (ref 11.5–15.5)
WBC: 7.9 10*3/uL (ref 4.0–10.5)
nRBC: 0 % (ref 0.0–0.2)

## 2023-05-20 LAB — GLUCOSE, CAPILLARY
Glucose-Capillary: 126 mg/dL — ABNORMAL HIGH (ref 70–99)
Glucose-Capillary: 89 mg/dL (ref 70–99)

## 2023-05-20 MED ORDER — ONDANSETRON HCL 4 MG/2ML IJ SOLN
4.0000 mg | Freq: Four times a day (QID) | INTRAMUSCULAR | Status: DC | PRN
  Administered 2023-05-21: 4 mg via INTRAVENOUS
  Filled 2023-05-20: qty 2

## 2023-05-20 MED ORDER — CEFAZOLIN SODIUM-DEXTROSE 2-4 GM/100ML-% IV SOLN
2.0000 g | Freq: Once | INTRAVENOUS | Status: AC
  Administered 2023-05-20: 2 g via INTRAVENOUS
  Filled 2023-05-20: qty 100

## 2023-05-20 MED ORDER — MISOPROSTOL 50MCG HALF TABLET: 50.0000 ug | ORAL_TABLET | Freq: Once | ORAL | Status: DC

## 2023-05-20 MED ORDER — DIPHENHYDRAMINE HCL 50 MG/ML IJ SOLN: 12.5000 mg | Freq: Once | INTRAMUSCULAR | Status: AC

## 2023-05-20 MED ORDER — INSULIN ASPART 100 UNIT/ML FLEXPEN
15.0000 [IU] | PEN_INJECTOR | Freq: Three times a day (TID) | SUBCUTANEOUS | Status: DC
  Filled 2023-05-20 (×2): qty 3

## 2023-05-20 MED ORDER — FLEET ENEMA RE ENEM: 1.0000 | ENEMA | RECTAL | Status: DC | PRN

## 2023-05-20 MED ORDER — LACTATED RINGERS IV SOLN: INTRAVENOUS | Status: DC

## 2023-05-20 MED ORDER — TERBUTALINE SULFATE 1 MG/ML IJ SOLN: 0.2500 mg | Freq: Once | INTRAMUSCULAR | Status: DC | PRN

## 2023-05-20 MED ORDER — INSULIN ASPART 100 UNIT/ML IJ SOLN: 15.0000 [IU] | Freq: Three times a day (TID) | INTRAMUSCULAR | Status: DC

## 2023-05-20 MED ORDER — DIPHENHYDRAMINE HCL 50 MG/ML IJ SOLN
12.5000 mg | Freq: Once | INTRAMUSCULAR | Status: AC
  Administered 2023-05-20: 12.5 mg via INTRAVENOUS
  Filled 2023-05-20: qty 1

## 2023-05-20 MED ORDER — SOD CITRATE-CITRIC ACID 500-334 MG/5ML PO SOLN: 30.0000 mL | ORAL | Status: DC | PRN

## 2023-05-20 MED ORDER — LACTATED RINGERS IV BOLUS
1000.0000 mL | Freq: Once | INTRAVENOUS | Status: AC
  Administered 2023-05-20: 1000 mL via INTRAVENOUS

## 2023-05-20 MED ORDER — INSULIN NPH (HUMAN) (ISOPHANE) 100 UNIT/ML ~~LOC~~ SUSP
24.0000 [IU] | Freq: Two times a day (BID) | SUBCUTANEOUS | Status: DC
  Administered 2023-05-20: 24 [IU] via SUBCUTANEOUS
  Filled 2023-05-20: qty 10

## 2023-05-20 MED ORDER — ACETAMINOPHEN 325 MG PO TABS: 650.0000 mg | ORAL_TABLET | ORAL | Status: DC | PRN

## 2023-05-20 MED ORDER — FENTANYL-BUPIVACAINE-NACL 0.5-0.125-0.9 MG/250ML-% EP SOLN
12.0000 mL/h | EPIDURAL | Status: DC | PRN
  Administered 2023-05-21: 12 mL/h via EPIDURAL
  Filled 2023-05-20: qty 250

## 2023-05-20 MED ORDER — LIDOCAINE HCL (PF) 1 % IJ SOLN: 30.0000 mL | INTRAMUSCULAR | Status: DC | PRN

## 2023-05-20 MED ORDER — OXYTOCIN BOLUS FROM INFUSION
333.0000 mL | Freq: Once | INTRAVENOUS | Status: AC
  Administered 2023-05-21: 333 mL via INTRAVENOUS

## 2023-05-20 MED ORDER — MISOPROSTOL 25 MCG QUARTER TABLET
25.0000 ug | ORAL_TABLET | Freq: Once | ORAL | Status: DC
  Filled 2023-05-20: qty 1

## 2023-05-20 MED ORDER — CEFAZOLIN SODIUM-DEXTROSE 1-4 GM/50ML-% IV SOLN
1.0000 g | Freq: Three times a day (TID) | INTRAVENOUS | Status: DC
  Administered 2023-05-20 – 2023-05-21 (×2): 1 g via INTRAVENOUS
  Filled 2023-05-20 (×3): qty 50

## 2023-05-20 MED ORDER — PHENYLEPHRINE 80 MCG/ML (10ML) SYRINGE FOR IV PUSH (FOR BLOOD PRESSURE SUPPORT)
80.0000 ug | PREFILLED_SYRINGE | INTRAVENOUS | Status: AC | PRN
  Administered 2023-05-21 (×3): 80 ug via INTRAVENOUS

## 2023-05-20 MED ORDER — DIPHENHYDRAMINE HCL 50 MG/ML IJ SOLN
INTRAMUSCULAR | Status: AC
  Administered 2023-05-20: 12.5 mg via INTRAVENOUS
  Filled 2023-05-20: qty 1

## 2023-05-20 MED ORDER — LACTATED RINGERS IV SOLN
500.0000 mL | INTRAVENOUS | Status: DC | PRN
  Administered 2023-05-20 – 2023-05-21 (×2): 500 mL via INTRAVENOUS

## 2023-05-20 MED ORDER — PHENYLEPHRINE 80 MCG/ML (10ML) SYRINGE FOR IV PUSH (FOR BLOOD PRESSURE SUPPORT)
80.0000 ug | PREFILLED_SYRINGE | INTRAVENOUS | Status: DC | PRN
  Filled 2023-05-20: qty 10

## 2023-05-20 MED ORDER — NIFEDIPINE ER OSMOTIC RELEASE 30 MG PO TB24
30.0000 mg | ORAL_TABLET | Freq: Every day | ORAL | Status: DC
  Administered 2023-05-20 – 2023-05-21 (×2): 30 mg via ORAL
  Filled 2023-05-20 (×2): qty 1

## 2023-05-20 MED ORDER — OXYTOCIN-SODIUM CHLORIDE 30-0.9 UT/500ML-% IV SOLN
1.0000 m[IU]/min | INTRAVENOUS | Status: DC
  Administered 2023-05-20: 2 m[IU]/min via INTRAVENOUS

## 2023-05-20 MED ORDER — EPHEDRINE 5 MG/ML INJ
10.0000 mg | INTRAVENOUS | Status: DC | PRN
  Filled 2023-05-20: qty 5

## 2023-05-20 MED ORDER — NIFEDIPINE ER OSMOTIC RELEASE 30 MG PO TB24: 30.0000 mg | ORAL_TABLET | Freq: Every day | ORAL | Status: DC

## 2023-05-20 MED ORDER — DIPHENHYDRAMINE HCL 50 MG/ML IJ SOLN
12.5000 mg | INTRAMUSCULAR | Status: DC | PRN
  Administered 2023-05-21: 12.5 mg via INTRAVENOUS
  Filled 2023-05-20: qty 1

## 2023-05-20 MED ORDER — FENTANYL CITRATE (PF) 100 MCG/2ML IJ SOLN
50.0000 ug | INTRAMUSCULAR | Status: DC | PRN
  Administered 2023-05-20 – 2023-05-21 (×4): 100 ug via INTRAVENOUS
  Filled 2023-05-20 (×4): qty 2

## 2023-05-20 MED ORDER — EPHEDRINE 5 MG/ML INJ
10.0000 mg | INTRAVENOUS | Status: DC | PRN
  Administered 2023-05-21: 10 mg via INTRAVENOUS

## 2023-05-20 MED ORDER — OXYTOCIN-SODIUM CHLORIDE 30-0.9 UT/500ML-% IV SOLN
2.5000 [IU]/h | INTRAVENOUS | Status: DC
  Filled 2023-05-20 (×2): qty 500

## 2023-05-20 MED ORDER — LACTATED RINGERS IV SOLN
500.0000 mL | Freq: Once | INTRAVENOUS | Status: AC
  Administered 2023-05-21: 500 mL via INTRAVENOUS

## 2023-05-20 NOTE — MAU Provider Note (Signed)
History     CSN: [id number]  Arrival date and time: 05/20/23 1015   Event Date/Time   First Provider Initiated Contact with Patient 05/20/23 1047      Chief Complaint  Patient presents with   Decreased Fetal Movement   HPI  Patient presents from MFM office for non-reactive stress test. Pregnancy c/b cHTN, T2DM, FGR. Patient notes DFM for past few hours as well. Otherwise has felt her usual. Denies HA, vision changes, RUQ pain, marked increase in extremity swelling, VB, LOF. States sugars well-controlled and no elevated Bps at home.  Dr. Bryn Gulling requests extended monitoring.  Past Medical History:  Diagnosis Date   Abnormal maternal serum screening test 02/26/2023   HR due to low FF     Abnormal Pap smear    Bacterial vaginosis    Gestational diabetes    likely pre-exisiting; all pregnancies glyburide started 11/4   Gestational thrombocytopenia (HCC)    Headache(784.0)    Hernia, umbilical    History of abnormal cervical Pap smear    normal 2014   History of gestational diabetes 06/26/2019   Mild preeclampsia delivered 01/22/2016   Moderate persistent asthma 08/07/2013   Obesity complicating pregnancy 09/21/2013   Type 2 diabetes mellitus (HCC)     Past Surgical History:  Procedure Laterality Date   UMBILICAL HERNIA REPAIR N/A 11/22/2014   Procedure: LAPAROSCOPIC UMBILICAL HERNIA;  Surgeon: Axel Filler, MD;  Location: [facility] OR;  Service: General;  Laterality: N/A;   WISDOM TOOTH EXTRACTION      Family History  Problem Relation Age of Onset   Hypertension Father    Diabetes Father    Diabetes Mother    Seizures Brother     Social History   Tobacco Use   Smoking status: Never   Smokeless tobacco: Never  Vaping Use   Vaping status: Never Used  Substance Use Topics   Alcohol use: No   Drug use: No    Allergies:  Allergies  Allergen Reactions   Latex Itching and Rash   Penicillins Itching and Rash    Did it involve swelling of the face/tongue/throat,  SOB, or low BP? N Did it involve sudden or severe rash/hives, skin peeling, or any reaction on the inside of your mouth or nose? Y Did you need to seek medical attention at a hospital or doctor's office? No When did it last happen?  Several  Years Ago    If all above answers are "NO", may proceed with cephalosporin use.        Medications Prior to Admission  Medication Sig Dispense Refill Last Dose   aspirin EC 81 MG tablet Take 2 tablets (162 mg total) by mouth daily. Start taking when you are [redacted] weeks pregnant for rest of pregnancy for prevention of preeclampsia 300 tablet 2 05/20/2023   insulin aspart (NOVOLOG FLEXPEN) 100 UNIT/ML FlexPen Inject 28-30 Units into the skin 3 (three) times daily with meals. 15 mL 11 05/20/2023   insulin NPH Human (NOVOLIN N) 100 UNIT/ML injection Inject 0.48 mLs (48 Units total) into the skin 2 (two) times daily at 8 am and 10 pm. 10 mL 11 05/20/2023   Prenatal Vit-Fe Phos-FA-Omega (VITAFOL GUMMIES) 3.33-0.333-34.8 MG CHEW Chew 3 tablets by mouth daily. 90 tablet 11 05/20/2023   Accu-Chek Softclix Lancets lancets Use four times per day as instructed 100 each 12    acetaminophen (TYLENOL) 500 MG tablet Take 2 tablets (1,000 mg total) by mouth every 6 (six) hours as needed for fever,  headache or mild pain. 30 tablet 0    albuterol (VENTOLIN HFA) 108 (90 Base) MCG/ACT inhaler Inhale 1-2 puffs into the lungs every 6 (six) hours as needed for wheezing or shortness of breath. 18 g 1    BD PEN NEEDLE NANO 2ND GEN 32G X 4 MM MISC Inject into the skin 2 (two) times daily.      blood glucose meter kit and supplies KIT Dispense based on patient and insurance preference. Use up to four times daily as directed. 1 each 0    Continuous Glucose Sensor (DEXCOM G7 SENSOR) MISC Replace sensor every 10 days 3 each 5    cyclobenzaprine (FLEXERIL) 10 MG tablet Take 1 tablet (10 mg total) by mouth every 8 (eight) hours as needed for muscle spasms. (Patient not taking: Reported on  05/09/2023) 30 tablet 0    glucose blood (ACCU-CHEK GUIDE) test strip Use four times per day as instructed 100 each 12    Insulin Syringe-Needle U-100 (INSULIN SYRINGE 1CC/31GX5/16") 31G X 5/16" 1 ML MISC Use 2 (two) times daily. 100 each 6    NIFEdipine (PROCARDIA XL) 30 MG 24 hr tablet Take 1 tablet (30 mg total) by mouth daily. 30 tablet 6     Review of Systems  See HPI.  Physical Exam   Blood pressure 124/80, pulse (!) 101, temperature 98 F (36.7 C), temperature source Oral, resp. rate 20, last menstrual period 09/09/2022, SpO2 98%.  Physical Exam Constitutional:      General: She is not in acute distress.    Appearance: She is obese. She is not ill-appearing.  Cardiovascular:     Rate and Rhythm: Normal rate and regular rhythm.  Pulmonary:     Effort: Pulmonary effort is normal.  Abdominal:     Comments: Gravid  Musculoskeletal:     Comments: Mild b/l LEE  Neurological:     Mental Status: Mental status is at baseline.  Psychiatric:        Mood and Affect: Mood normal.    FWB: Cat II. FHT 130s/moderate variability/+accels/2 borderline prolonged decelerations of 1-2 minutes on prolonged monitoring  Results for orders placed or performed during the hospital encounter of 05/20/23 (from the past 24 hour(s))  Urinalysis, Routine w reflex microscopic -Urine, Clean Catch     Status: Abnormal   Collection Time: 05/20/23 10:23 AM  Result Value Ref Range   Color, Urine YELLOW YELLOW   APPearance CLEAR CLEAR   Specific Gravity, Urine 1.013 1.005 - 1.030   pH 6.0 5.0 - 8.0   Glucose, UA NEGATIVE NEGATIVE mg/dL   Hgb urine dipstick NEGATIVE NEGATIVE   Bilirubin Urine NEGATIVE NEGATIVE   Ketones, ur NEGATIVE NEGATIVE mg/dL   Protein, ur NEGATIVE NEGATIVE mg/dL   Nitrite NEGATIVE NEGATIVE   Leukocytes,Ua TRACE (A) NEGATIVE   RBC / HPF 0-5 0 - 5 RBC/hpf   WBC, UA 0-5 0 - 5 WBC/hpf   Bacteria, UA RARE (A) NONE SEEN   Squamous Epithelial / HPF 0-5 0 - 5 /HPF   Mucus PRESENT    Glucose, capillary     Status: Abnormal   Collection Time: 05/20/23 10:42 AM  Result Value Ref Range   Glucose-Capillary 126 (H) 70 - 99 mg/dL     MAU Course  Procedures  MDM Patient presents for extended monitoring in setting of non-reactive NST and DFM from MFM visit today. Initial FHT with borderline minimal variability, so BPP ordered. Reassuringly 8/8.  While on continuous monitoring, patient had 2 borderline prolonged  decelerations that resolved with fluids and position changes. Rare contractions. Given comorbidity of cHTN, GDM, FGR, decision was made to admit for IOL by Drs. Anyanwu and Schiable.  Assessment and Plan   #Non-reassuring FHT: Admit for IOL  Joanne Gavel 05/20/2023, 1:43 PM

## 2023-05-20 NOTE — Progress Notes (Signed)
Notified NICU Charge RN of plan to [facility] just before 1900 when adequately treated for GBS. Pt has gone quickly to delivery with previous babies that were larger.

## 2023-05-20 NOTE — H&P (Signed)
LABOR AND DELIVERY ADMISSION HISTORY AND PHYSICAL NOTE  Monique Schmidt is a 37 y.o. female [phone] with IUP at [redacted]w[redacted]d presenting for IOL for non-reassuring fetal status in setting of decreased fetal movement and multiple comorbidities.   Patient seen at MFM earlier today for weekly BPP due to IUGR, cHTN on meds, and GDMA2 on insulin. At that visit NST was non-reactive and patient reported decreased fetal movement so she was sent to MAU for extended monitoring. While in MAU she had two prolonged decelerations and decision was made to proceed with delivery.   Patient reports the fetal movement as decreased . Patient reports uterine contraction  activity as none. Patient reports  vaginal bleeding as none. Patient describes fluid per vagina as None.   She plans on breast feeding feeding. Her contraception plan is: no method.  Prenatal History/Complications: PNC at [facility]  Sono:  @[redacted]w[redacted]d , CWD, normal anatomy, cephalic presentation, posterior placenta, 10%ile, EFW 1831g  Pregnancy complications:  Patient Active Problem List   Diagnosis Date Noted   Type 2 diabetes mellitus affecting pregnancy in third trimester, antepartum 05/20/2023   Uncontrolled diabetes mellitus with hyperglycemia (HCC) 04/26/2023   Trichomonal vaginitis during pregnancy in third trimester 04/14/2023   Gestational thrombocytopenia (HCC) 04/11/2023   Fetal growth restriction antepartum 03/01/2023   T13/T18+ on Panorama 02/26/2023   Gonorrhea affecting pregnancy 01/22/2023   Uterine fibroid in pregnancy 12/20/2022   Chronic hypertension affecting pregnancy 12/12/2022   Type 2 diabetes mellitus affecting pregnancy, antepartum  insulin, oral 12/11/2022   Alpha thalassemia silent carrier 12/08/2022   Supervision of high risk pregnancy, antepartum 11/19/2022   AMA (advanced maternal age) multigravida 35+, unspecified trimester 11/19/2022   BMI 50.0-59.9, adult (HCC) 11/09/2013    Past Medical History: Past Medical History:   Diagnosis Date   Abnormal maternal serum screening test 02/26/2023   HR due to low FF     Abnormal Pap smear    Bacterial vaginosis    Gestational diabetes    likely pre-exisiting; all pregnancies glyburide started 11/4   Gestational thrombocytopenia (HCC)    Headache(784.0)    Hernia, umbilical    History of abnormal cervical Pap smear    normal 2014   History of gestational diabetes 06/26/2019   Mild preeclampsia delivered 01/22/2016   Moderate persistent asthma 08/07/2013   Obesity complicating pregnancy 09/21/2013   Type 2 diabetes mellitus (HCC)     Past Surgical History: Past Surgical History:  Procedure Laterality Date   UMBILICAL HERNIA REPAIR N/A 11/22/2014   Procedure: LAPAROSCOPIC UMBILICAL HERNIA;  Surgeon: Axel Filler, MD;  Location: [facility] OR;  Service: General;  Laterality: N/A;   WISDOM TOOTH EXTRACTION      Obstetrical History: OB History     Gravida  7   Para  5   Term  5   Preterm      AB  1   Living  5      SAB  1   IAB  0   Ectopic  0   Multiple  0   Live Births  5           Social History: Social History   Socioeconomic History   Marital status: Divorced    Spouse name: Not on file   Number of children: Not on file   Years of education: Not on file   Highest education level: Not on file  Occupational History   Not on file  Tobacco Use   Smoking status: Never   Smokeless  tobacco: Never  Vaping Use   Vaping status: Never Used  Substance and Sexual Activity   Alcohol use: No   Drug use: No   Sexual activity: Not Currently    Birth control/protection: None  Other Topics Concern   Not on file  Social History Narrative   Not on file   Social Determinants of Health   Financial Resource Strain: Not on file  Food Insecurity: No Food Insecurity (05/20/2023)   Hunger Vital Sign    Worried About Running Out of Food in the Last Year: Never true    Ran Out of Food in the Last Year: Never true  Recent Concern: Food  Insecurity - Food Insecurity Present (04/26/2023)   Hunger Vital Sign    Worried About Running Out of Food in the Last Year: Sometimes true    Ran Out of Food in the Last Year: Sometimes true  Transportation Needs: No Transportation Needs (05/20/2023)   PRAPARE - Administrator, Civil Service (Medical): No    Lack of Transportation (Non-Medical): No  Physical Activity: Not on file  Stress: Not on file  Social Connections: Unknown (10/05/2022)   Received from [facility]   Social Network    Social Network: Not on file    Family History: Family History  Problem Relation Age of Onset   Stroke Mother    Diabetes Mother    Hypertension Father    Diabetes Father    Seizures Brother     Allergies: Allergies  Allergen Reactions   Latex Itching and Rash   Penicillins Itching and Rash    Did it involve swelling of the face/tongue/throat, SOB, or low BP? N Did it involve sudden or severe rash/hives, skin peeling, or any reaction on the inside of your mouth or nose? Y Did you need to seek medical attention at a hospital or doctor's office? No When did it last happen?  Several  Years Ago    If all above answers are "NO", may proceed with cephalosporin use.        Medications Prior to Admission  Medication Sig Dispense Refill Last Dose   albuterol (VENTOLIN HFA) 108 (90 Base) MCG/ACT inhaler Inhale 1-2 puffs into the lungs every 6 (six) hours as needed for wheezing or shortness of breath. 18 g 1 05/20/2023   aspirin EC 81 MG tablet Take 2 tablets (162 mg total) by mouth daily. Start taking when you are [redacted] weeks pregnant for rest of pregnancy for prevention of preeclampsia 300 tablet 2 05/20/2023   insulin aspart (NOVOLOG FLEXPEN) 100 UNIT/ML FlexPen Inject 28-30 Units into the skin 3 (three) times daily with meals. 15 mL 11 05/20/2023   insulin NPH Human (NOVOLIN N) 100 UNIT/ML injection Inject 0.48 mLs (48 Units total) into the skin 2 (two) times daily at 8 am and 10 pm. 10 mL  11 05/20/2023   NIFEdipine (PROCARDIA XL) 30 MG 24 hr tablet Take 1 tablet (30 mg total) by mouth daily. 30 tablet 6 Past Week   Prenatal Vit-Fe Phos-FA-Omega (VITAFOL GUMMIES) 3.33-0.333-34.8 MG CHEW Chew 3 tablets by mouth daily. 90 tablet 11 05/20/2023   Accu-Chek Softclix Lancets lancets Use four times per day as instructed 100 each 12    acetaminophen (TYLENOL) 500 MG tablet Take 2 tablets (1,000 mg total) by mouth every 6 (six) hours as needed for fever, headache or mild pain. 30 tablet 0 Unknown   BD PEN NEEDLE NANO 2ND GEN 32G X 4 MM MISC Inject into  the skin 2 (two) times daily.      blood glucose meter kit and supplies KIT Dispense based on patient and insurance preference. Use up to four times daily as directed. 1 each 0    Continuous Glucose Sensor (DEXCOM G7 SENSOR) MISC Replace sensor every 10 days 3 each 5    cyclobenzaprine (FLEXERIL) 10 MG tablet Take 1 tablet (10 mg total) by mouth every 8 (eight) hours as needed for muscle spasms. (Patient not taking: Reported on 05/09/2023) 30 tablet 0 Unknown   glucose blood (ACCU-CHEK GUIDE) test strip Use four times per day as instructed 100 each 12    Insulin Syringe-Needle U-100 (INSULIN SYRINGE 1CC/31GX5/16") 31G X 5/16" 1 ML MISC Use 2 (two) times daily. 100 each 6      Review of Systems  All systems reviewed and negative except as stated in HPI  Physical Exam BP 124/80   Pulse (!) 101   Temp 98 F (36.7 C) (Oral)   Resp 20   LMP 09/09/2022 (Approximate)   SpO2 98%   Physical Exam Constitutional:      General: She is not in acute distress.    Appearance: Normal appearance. She is not ill-appearing.  HENT:     Head: Atraumatic.  Eyes:     General: No scleral icterus.    Conjunctiva/sclera: Conjunctivae normal.  Pulmonary:     Effort: Pulmonary effort is normal.  Skin:    General: Skin is warm and dry.     Coloration: Skin is not jaundiced or pale.  Neurological:     Mental Status: She is alert.     Coordination:  Coordination normal.  Psychiatric:        Mood and Affect: Mood normal.        Behavior: Behavior normal.     Presentation: cephalic by [place] at MFM earlier today  Fetal monitoring: Baseline: 130 bpm, Variability: Good {> 6 bpm), Accelerations: Reactive, and Decelerations: currently absent Uterine activity: None     Prenatal labs: ABO, Rh: --/--/PENDING (09/30 1112) Antibody: PENDING (09/30 1112) Rubella: 7.52 (04/01 1634) RPR: Non Reactive (08/07 1023)  HBsAg: Negative (04/01 1634)  HIV: Non Reactive (08/07 1023)  GC/Chlamydia:  Neisseria Gonorrhea  Date Value Ref Range Status  05/09/2023 Negative  Final   Chlamydia  Date Value Ref Range Status  05/09/2023 Negative  Final   GBS:      Prenatal Transfer Tool  Maternal Diabetes: Yes:  Diabetes Type:  Insulin/Medication controlled Genetic Screening: Abnormal:  Results: Elevated risk of Trisomy 18, Other:Elevated T13 risk. Declined amnio. Maternal Ultrasounds/Referrals: IUGR Fetal Ultrasounds or other Referrals:  None Maternal Substance Abuse:  No Significant Maternal Medications:  Meds include: Other: Insulin, procardia Significant Maternal Lab Results: Other: GBS unknown  Results for orders placed or performed during the hospital encounter of 05/20/23 (from the past 24 hour(s))  Urinalysis, Routine w reflex microscopic -Urine, Clean Catch   Collection Time: 05/20/23 10:23 AM  Result Value Ref Range   Color, Urine YELLOW YELLOW   APPearance CLEAR CLEAR   Specific Gravity, Urine 1.013 1.005 - 1.030   pH 6.0 5.0 - 8.0   Glucose, UA NEGATIVE NEGATIVE mg/dL   Hgb urine dipstick NEGATIVE NEGATIVE   Bilirubin Urine NEGATIVE NEGATIVE   Ketones, ur NEGATIVE NEGATIVE mg/dL   Protein, ur NEGATIVE NEGATIVE mg/dL   Nitrite NEGATIVE NEGATIVE   Leukocytes,Ua TRACE (A) NEGATIVE   RBC / HPF 0-5 0 - 5 RBC/hpf   WBC, UA 0-5 0 - 5  WBC/hpf   Bacteria, UA RARE (A) NONE SEEN   Squamous Epithelial / HPF 0-5 0 - 5 /HPF   Mucus PRESENT    Glucose, capillary   Collection Time: 05/20/23 10:42 AM  Result Value Ref Range   Glucose-Capillary 126 (H) 70 - 99 mg/dL  Type and screen   Collection Time: 05/20/23 11:12 AM  Result Value Ref Range   ABO/RH(D) PENDING    Antibody Screen PENDING    Sample Expiration      05/23/2023,2359 Performed at [facility], [address], [place], [place] [address]   Glucose, capillary   Collection Time: 05/20/23  1:50 PM  Result Value Ref Range   Glucose-Capillary 89 70 - 99 mg/dL    Monique Schmidt is a 38 y.o. [id number] at [redacted]w[redacted]d here for IOL for non-reassuring fetal status in setting of IUGR, DFM, cHTN on meds, and GDMA2.  #Labor: will assess cervix once adequately prophylaxed for GBS, hopefully AROM/pit. She reports rapid labor progression with ROM in the past #Pain: IV pain meds PRN, epidural upon request #FHT: Category I #GBS/ID: Unknown. Allergy of rash/itching to penicillin. Has tolerated numerous cephalosporins in the past. Start Ancef.  #MOF: breast feeding #MOC: no method #Circ: Yes  #cHTN: continue Procardia 30 XL daily  #T2DM: reduce insulin by half home regimen. NPH 24u BID, aspart TID with meals 15u  #Abnormal NIPT #Iatrogenic preterm delivery Peds notified of delivery, NICU currently has space available if needed.    Venora Maples, MD/MPH Attending Family Medicine Physician, [facility] for [facility], [facility]  05/20/2023, 2:51 PM

## 2023-05-20 NOTE — Procedures (Signed)
KAMEE BOBST [birth year] [redacted]w[redacted]d  Fetus A Non-Stress Test Interpretation for 05/20/23  Indication: IUGR, Chronic Hypertenstion, Diabetes, and AMA, obesity  Fetal Heart Rate A Mode: External Baseline Rate (A): 135 bpm Variability: Moderate Accelerations: 10 x 10 (15X15 x1) Decelerations: None Multiple birth?: No  Uterine Activity Mode: Palpation, Toco Contraction Frequency (min): 1 UC Contraction Duration (sec): 60 Contraction Quality: Mild Resting Tone Palpated: Relaxed Resting Time: Adequate  Interpretation (Fetal Testing) Nonstress Test Interpretation: Non-reactive Comments: Dr. Darra Lis reviewed tracing. After extended EFM, pt. advised to go to MAU for prolonged EFM.

## 2023-05-20 NOTE — Progress Notes (Signed)
Patient Vitals for the past 4 hrs:  BP Temp Temp src Pulse Resp SpO2  05/20/23 2315 (!) 156/90 -- -- 80 18 --  05/20/23 2215 (!) 142/90 -- -- 86 -- --  05/20/23 2056 134/79 -- -- 87 -- --  05/20/23 2053 -- 98.3 F (36.8 C) Oral -- 15 --  05/20/23 2021 -- -- -- -- -- 99 %  05/20/23 2016 -- -- -- -- -- 99 %  05/20/23 2011 -- -- -- -- -- 99 %  05/20/23 1947 (!) 156/87 -- -- 93 -- --   Balloon still in, feels about 2cms at os.  FHR Cat 1.  BS <100.  Pitocin at 8 mu/min. Difficult to tract ctx, pt feels them irregular and mild. Didn't take procardia this am. Will give now. Continue present mgt.

## 2023-05-20 NOTE — Plan of Care (Signed)
MFM Plan of Care  Dr. Macon Large notified me regarding the patients concerning fetal tracing. Since arriving to the MAU the fetus has shown multiple decelerations.  It appears the patient is also contracting.  This is concerning for intrauterine fetal hypoxia despite a biophysical of 8 out of 8.  Due to her fetal growth restriction as well as multiple comorbidities and concerning for intrauterine hypoxia it is recommended that she undergo delivery.

## 2023-05-20 NOTE — Progress Notes (Signed)
Patient Vitals for the past 4 hrs:  BP Temp Temp src Pulse Resp  05/20/23 1947 (!) 156/87 -- -- 93 --  05/20/23 1837 (!) 147/71 99.1 F (37.3 C) Oral 83 16  05/20/23 1723 (!) 135/90 -- -- 95 --  05/20/23 1606 134/88 -- -- 89 --   Vtx confirmed by [place].  Cx 1/50/ballottable, no BBOW felt.  Cooks inserted and inflated w/45cc H20.  Plan Pitocin, AROM when able.

## 2023-05-20 NOTE — Progress Notes (Signed)
   Patient information  Patient Name: Monique Schmidt  Patient MRN:   [id number]  Referring practice: MFM Referring Provider: [facility])  MFM CONSULT  Monique Schmidt is a 37 y.o. [id number] at [redacted]w[redacted]d here for ultrasound and consultation.   The patient was here for nonstress test that was not reactive.  She reports that her blood sugars have been around 110-120 when fasting and up to 120-180 when 2 hours postprandial.  I discussed my concern for potential fetal hypoxia due to a nonreactive tracing and the need for prolonged monitoring.  I recommend that she go to the hospital for at least 2 hours of fetal monitoring.  I discussed that the management will be based on the tracing and an ultrasound will potentially be ordered as well.  I discussed that she would likely have her blood pressure checked and her labs assessed as well.  The patient verbalized understanding and will go straight to the MAU for assessment.  Assessment - Non-reactive NST -Type 2 DM - Obesity - CHTN - FGR (10%) Plan -Sent to [facility] triage for nonreassuring fetal status based on her nonstress test today.  Recommend at least 2 hours of fetal monitoring.  If in 2 hours the tracing is reactive without decelerations and she can be discharged and follow-up Thursday.  If there is concern on the fetal tracing for decelerations or for minimal variability and she should be admitted for prolonged monitoring. -I have called an notified the OB provider on call (Dr. Earlene Plater)   Review of Systems: A review of systems was performed and was negative except per HPI   Vitals and Physical Exam    05/20/2023   10:46 AM 05/20/2023   10:30 AM 05/20/2023    9:59 AM  Vitals with BMI  Systolic 124 127 308  Diastolic 80 82 75  Pulse 101 96   Sitting comfortably on the NST bed Nonlabored breathing Normal rate and rhythm Abdomen is nontender  Past pregnancies OB History  Gravida Para Term Preterm AB Living  7 5 5   1  5   SAB IAB Ectopic Multiple Live Births  1 0 0 0 5    # Outcome Date GA Lbr Len/2nd Weight Sex Type Anes PTL Lv  7 Current           6 Term 01/20/16 [redacted]w[redacted]d 00:55 / 00:08 9 lb 11.7 oz (4.414 kg) F Vag-Spont None  LIV     Birth Comments: none  5 Term 12/15/13 [redacted]w[redacted]d 06:43 / 00:02 8 lb 13.8 oz (4.02 kg) F Vag-Spont None  LIV  4 Term 06/26/12 [redacted]w[redacted]d 21:07 / 00:01 7 lb 2 oz (3.232 kg) F Vag-Spont None  LIV  3 Term 03/08/11 [redacted]w[redacted]d 01:27 / 00:02 8 lb 9 oz (3.884 kg) F Vag-Spont None  LIV     Birth Comments: none  2 Term 05/19/09 [redacted]w[redacted]d   F Vag-Spont None N LIV  1 SAB              I spent 20 minutes reviewing the patients chart, including labs and images as well as counseling the patient about her medical conditions. Greater than 50% of the time was spent in direct face-to-face patient counseling.  Braxton Feathers  MFM, [facility]   05/20/2023  12:51 PM

## 2023-05-20 NOTE — MAU Note (Signed)
..  Monique Schmidt is a 37 y.o. at [redacted]w[redacted]d here in MAU reporting: sent over from MFM for prolonged monitoring due not NRNST for 40 mins. Denies VB or LOF. DFM that started today.   Pain score: 0 Vitals:   05/20/23 1030  BP: 127/82  Pulse: 96  Resp: 20  Temp: 98 F (36.7 C)  SpO2: 100%     FHT:134 Lab orders placed from triage: UA

## 2023-05-21 ENCOUNTER — Inpatient Hospital Stay (HOSPITAL_COMMUNITY): Payer: Medicaid Other | Admitting: Anesthesiology

## 2023-05-21 DIAGNOSIS — O365930 Maternal care for other known or suspected poor fetal growth, third trimester, not applicable or unspecified: Secondary | ICD-10-CM

## 2023-05-21 DIAGNOSIS — Z3A00 Weeks of gestation of pregnancy not specified: Secondary | ICD-10-CM | POA: Diagnosis not present

## 2023-05-21 DIAGNOSIS — O9912 Other diseases of the blood and blood-forming organs and certain disorders involving the immune mechanism complicating childbirth: Secondary | ICD-10-CM

## 2023-05-21 DIAGNOSIS — O1002 Pre-existing essential hypertension complicating childbirth: Secondary | ICD-10-CM

## 2023-05-21 DIAGNOSIS — O368130 Decreased fetal movements, third trimester, not applicable or unspecified: Secondary | ICD-10-CM

## 2023-05-21 DIAGNOSIS — Z3A35 35 weeks gestation of pregnancy: Secondary | ICD-10-CM

## 2023-05-21 DIAGNOSIS — O24424 Gestational diabetes mellitus in childbirth, insulin controlled: Secondary | ICD-10-CM

## 2023-05-21 LAB — COMPREHENSIVE METABOLIC PANEL
ALT: 20 U/L (ref 0–44)
AST: 26 U/L (ref 15–41)
Albumin: 2.4 g/dL — ABNORMAL LOW (ref 3.5–5.0)
Alkaline Phosphatase: 107 U/L (ref 38–126)
Anion gap: 10 (ref 5–15)
BUN: <5 mg/dL — ABNORMAL LOW (ref 6–20)
CO2: 24 mmol/L (ref 22–32)
Calcium: 8.3 mg/dL — ABNORMAL LOW (ref 8.9–10.3)
Chloride: 100 mmol/L (ref 98–111)
Creatinine, Ser: 0.66 mg/dL (ref 0.44–1.00)
GFR, Estimated: >60 mL/min (ref >60)
Glucose, Bld: 113 mg/dL — ABNORMAL HIGH (ref 70–99)
Potassium: 3 mmol/L — ABNORMAL LOW (ref 3.5–5.1)
Sodium: 134 mmol/L — ABNORMAL LOW (ref 135–145)
Total Bilirubin: 0.4 mg/dL (ref 0.3–1.2)
Total Protein: 6.4 g/dL — ABNORMAL LOW (ref 6.5–8.1)

## 2023-05-21 LAB — CBC
HCT: 39.4 % (ref 36.0–46.0)
HCT: 39.9 % (ref 36.0–46.0)
Hemoglobin: 13.2 g/dL (ref 12.0–15.0)
Hemoglobin: 13.6 g/dL (ref 12.0–15.0)
MCH: 27.7 pg (ref 26.0–34.0)
MCH: 28.1 pg (ref 26.0–34.0)
MCHC: 33.5 g/dL (ref 30.0–36.0)
MCHC: 34.1 g/dL (ref 30.0–36.0)
MCV: 82.6 fL (ref 80.0–100.0)
MCV: 82.4 fL (ref 80.0–100.0)
Platelets: 100 10*3/uL — ABNORMAL LOW (ref 150–400)
Platelets: 104 10*3/uL — ABNORMAL LOW (ref 150–400)
RBC: 4.77 MIL/uL (ref 3.87–5.11)
RBC: 4.84 MIL/uL (ref 3.87–5.11)
RDW: 15 % (ref 11.5–15.5)
RDW: 15 % (ref 11.5–15.5)
WBC: 8.4 10*3/uL (ref 4.0–10.5)
WBC: 13 10*3/uL — ABNORMAL HIGH (ref 4.0–10.5)
nRBC: 0 % (ref 0.0–0.2)
nRBC: 0 % (ref 0.0–0.2)

## 2023-05-21 LAB — GLUCOSE, CAPILLARY
Glucose-Capillary: 144 mg/dL — ABNORMAL HIGH (ref 70–99)
Glucose-Capillary: 113 mg/dL — ABNORMAL HIGH (ref 70–99)
Glucose-Capillary: 244 mg/dL — ABNORMAL HIGH (ref 70–99)

## 2023-05-21 LAB — CREATININE, SERUM
Creatinine, Ser: 0.58 mg/dL (ref 0.44–1.00)
GFR, Estimated: >60 mL/min (ref >60)

## 2023-05-21 LAB — RPR: RPR Ser Ql: NONREACTIVE

## 2023-05-21 MED ORDER — NALBUPHINE HCL 10 MG/ML IJ SOLN
2.0000 mg | Freq: Four times a day (QID) | INTRAMUSCULAR | Status: DC | PRN
  Administered 2023-05-21: 2 mg via INTRAVENOUS
  Filled 2023-05-21: qty 1

## 2023-05-21 MED ORDER — PRENATAL MULTIVITAMIN CH
1.0000 | ORAL_TABLET | Freq: Every day | ORAL | Status: DC
  Administered 2023-05-22 – 2023-05-23 (×2): 1 via ORAL
  Filled 2023-05-21 (×2): qty 1

## 2023-05-21 MED ORDER — DIPHENHYDRAMINE HCL 25 MG PO CAPS
25.0000 mg | ORAL_CAPSULE | Freq: Four times a day (QID) | ORAL | Status: DC | PRN
  Administered 2023-05-22 (×2): 25 mg via ORAL
  Filled 2023-05-21 (×2): qty 1

## 2023-05-21 MED ORDER — MAGNESIUM HYDROXIDE 400 MG/5ML PO SUSP: 30.0000 mL | ORAL | Status: DC | PRN

## 2023-05-21 MED ORDER — TRANEXAMIC ACID-NACL 1000-0.7 MG/100ML-% IV SOLN: 1000.0000 mg | INTRAVENOUS | Status: DC

## 2023-05-21 MED ORDER — FUROSEMIDE 20 MG PO TABS
20.0000 mg | ORAL_TABLET | Freq: Two times a day (BID) | ORAL | Status: DC
  Administered 2023-05-21 – 2023-05-23 (×4): 20 mg via ORAL
  Filled 2023-05-21 (×4): qty 1

## 2023-05-21 MED ORDER — OXYCODONE-ACETAMINOPHEN 5-325 MG PO TABS
2.0000 | ORAL_TABLET | ORAL | Status: DC | PRN
  Administered 2023-05-21 – 2023-05-22 (×4): 2 via ORAL
  Filled 2023-05-21 (×4): qty 2

## 2023-05-21 MED ORDER — TETANUS-DIPHTH-ACELL PERTUSSIS 5-2.5-18.5 LF-MCG/0.5 IM SUSY: 0.5000 mL | PREFILLED_SYRINGE | Freq: Once | INTRAMUSCULAR | Status: DC

## 2023-05-21 MED ORDER — COCONUT OIL OIL
1.0000 | TOPICAL_OIL | Status: DC | PRN
  Administered 2023-05-21: 1 via TOPICAL

## 2023-05-21 MED ORDER — ENOXAPARIN SODIUM 80 MG/0.8ML IJ SOSY
70.0000 mg | PREFILLED_SYRINGE | INTRAMUSCULAR | Status: DC
  Administered 2023-05-22 – 2023-05-23 (×2): 70 mg via SUBCUTANEOUS
  Filled 2023-05-21 (×2): qty 0.8

## 2023-05-21 MED ORDER — DEXTROSE 50 % IV SOLN: 0.0000 mL | INTRAVENOUS | Status: DC | PRN

## 2023-05-21 MED ORDER — NIFEDIPINE ER OSMOTIC RELEASE 30 MG PO TB24
60.0000 mg | ORAL_TABLET | Freq: Every day | ORAL | Status: DC
  Administered 2023-05-22 – 2023-05-23 (×2): 60 mg via ORAL
  Filled 2023-05-21 (×2): qty 2

## 2023-05-21 MED ORDER — DIBUCAINE (PERIANAL) 1 % EX OINT: 1.0000 | TOPICAL_OINTMENT | CUTANEOUS | Status: DC | PRN

## 2023-05-21 MED ORDER — LIDOCAINE HCL (PF) 1 % IJ SOLN
INTRAMUSCULAR | Status: DC | PRN
  Administered 2023-05-21 (×2): 4 mL via EPIDURAL

## 2023-05-21 MED ORDER — TRANEXAMIC ACID-NACL 1000-0.7 MG/100ML-% IV SOLN
INTRAVENOUS | Status: AC
  Administered 2023-05-21: 1000 mg via INTRAVENOUS
  Filled 2023-05-21: qty 100

## 2023-05-21 MED ORDER — ONDANSETRON HCL 4 MG PO TABS: 4.0000 mg | ORAL_TABLET | ORAL | Status: DC | PRN

## 2023-05-21 MED ORDER — METFORMIN HCL 500 MG PO TABS
500.0000 mg | ORAL_TABLET | Freq: Two times a day (BID) | ORAL | Status: DC
  Administered 2023-05-21: 500 mg via ORAL
  Filled 2023-05-21 (×2): qty 1

## 2023-05-21 MED ORDER — NALBUPHINE HCL 10 MG/ML IJ SOLN: 2.0000 mg | INTRAMUSCULAR | Status: DC | PRN

## 2023-05-21 MED ORDER — LACTATED RINGERS IV SOLN: INTRAVENOUS | Status: DC

## 2023-05-21 MED ORDER — DEXTROSE IN LACTATED RINGERS 5 % IV SOLN: INTRAVENOUS | Status: DC

## 2023-05-21 MED ORDER — ONDANSETRON HCL 4 MG/2ML IJ SOLN: 4.0000 mg | INTRAMUSCULAR | Status: DC | PRN

## 2023-05-21 MED ORDER — BENZOCAINE-MENTHOL 20-0.5 % EX AERO: 1.0000 | INHALATION_SPRAY | CUTANEOUS | Status: DC | PRN

## 2023-05-21 MED ORDER — WITCH HAZEL-GLYCERIN EX PADS: 1.0000 | MEDICATED_PAD | CUTANEOUS | Status: DC | PRN

## 2023-05-21 MED ORDER — SIMETHICONE 80 MG PO CHEW: 80.0000 mg | CHEWABLE_TABLET | ORAL | Status: DC | PRN

## 2023-05-21 MED ORDER — ACETAMINOPHEN 325 MG PO TABS
ORAL_TABLET | ORAL | Status: AC
  Filled 2023-05-21: qty 2

## 2023-05-21 MED ORDER — POTASSIUM CHLORIDE 20 MEQ PO PACK
20.0000 meq | PACK | Freq: Two times a day (BID) | ORAL | Status: DC
  Administered 2023-05-21 – 2023-05-23 (×4): 20 meq via ORAL
  Filled 2023-05-21 (×6): qty 1

## 2023-05-21 MED ORDER — ACETAMINOPHEN 325 MG PO TABS
650.0000 mg | ORAL_TABLET | ORAL | Status: DC | PRN
  Administered 2023-05-21: 650 mg via ORAL

## 2023-05-21 MED ORDER — OXYCODONE-ACETAMINOPHEN 5-325 MG PO TABS: 1.0000 | ORAL_TABLET | ORAL | Status: DC | PRN

## 2023-05-21 MED ORDER — INSULIN GLARGINE-YFGN 100 UNIT/ML ~~LOC~~ SOLN
10.0000 [IU] | Freq: Every day | SUBCUTANEOUS | Status: DC
  Administered 2023-05-21 – 2023-05-22 (×2): 10 [IU] via SUBCUTANEOUS
  Filled 2023-05-21 (×3): qty 0.1

## 2023-05-21 MED ORDER — IBUPROFEN 600 MG PO TABS
600.0000 mg | ORAL_TABLET | Freq: Four times a day (QID) | ORAL | Status: DC
  Administered 2023-05-21 – 2023-05-22 (×4): 600 mg via ORAL
  Filled 2023-05-21 (×4): qty 1

## 2023-05-21 MED ORDER — INSULIN REGULAR(HUMAN) IN NACL 100-0.9 UT/100ML-% IV SOLN
INTRAVENOUS | Status: DC
  Administered 2023-05-21: 3 [IU]/h via INTRAVENOUS
  Filled 2023-05-21 (×2): qty 100

## 2023-05-21 MED ORDER — MEASLES, MUMPS & RUBELLA VAC IJ SOLR: 0.5000 mL | Freq: Once | INTRAMUSCULAR | Status: DC

## 2023-05-21 NOTE — Progress Notes (Signed)
Patient ID: Monique Schmidt, female   DOB: 05/08/1986, 37 y.o.   MRN: [id number] CTBS for increased vaginal bleeding and pt declining Metformin, IBU and Lovenox. CNM at [facility]. Scant active bleeding. FF, NT. VSS. Pt had been having severe cramping and was very hungry. Received 2 Percocet and ate dinner. Feeling much better. Asked what pt preferred for DM management and she prefers insulin. Will D/C Metformin and start Lantus 10U. Agreeable to Lovenox after discussing indication and OK w/ IBU.  CBC in am.   Dorathy Kinsman, CNM 05/21/2023 8:39 PM

## 2023-05-21 NOTE — Progress Notes (Signed)
Endotool suggests safe to switch to subcutaneous insulin with CBG of 113. CNM agrees to stop Endotool and switch to other plans for CBG management.

## 2023-05-21 NOTE — Lactation Note (Signed)
This note was copied from a baby's chart. Lactation Consultation Note  Patient Name: Boy Lakera Viall [id number] Date: 05/21/2023 Age:39 hours  Reason for consult: Primapara;1st time breastfeeding;Initial assessment;Late-preterm 34-36.6wks  P6, LPI, [redacted]w[redacted]d, <6 lbs, GDM ( Insulin)  Initial visit to see mother of LPI. Mother states she has experience with breastfeeding and fed her other 5 girls for 1 year each.   Discussed Early term, low birth weight infant feeding behavior and feeding plan.   Discussed a triple feeding approach: breastfeed with feeding cues and when baby is actively sucking/ feeding at breast. Stop when baby shows fatigue. Supplement with mother's expressed milk and/or formula. Refer to feeding guidelines based on age/ weight left at bedside Pump for 15 min every 3 hours in initiation phase to stimulate milk production   Mother receptive to pumping and fitted with a 24 flange. Instructed mother on the use, frequency, cleaning of breast pump and storage of breast milk.   Mother has having uterine discomfort and teaching may need to be reinforced.  Maternal Data Has patient been taught Hand Expression?: No Does the patient have breastfeeding experience prior to this delivery?: Yes How long did the patient breastfeed?: 1 year with each baby  Feeding Mother's Current Feeding Choice: Breast Milk and Formula Nipple Type: Slow - flow    Lactation Tools Discussed/Used Tools: Pump;Flanges Flange Size: 21 Breast pump type: Double-Electric Breast Pump;Manual Pump Education: Setup, frequency, and cleaning;Milk Storage Reason for Pumping: LPI Pumping frequency: every 3 hours for 15 min  I Consult Status Consult Status: Follow-up Date: 05/22/23 Follow-up type: In-patient    Christella Hartigan M 05/21/2023, 6:35 PM

## 2023-05-21 NOTE — Progress Notes (Signed)
Hold off on Endotool for right now until next CBG per CNM.

## 2023-05-21 NOTE — Anesthesia Preprocedure Evaluation (Signed)
Anesthesia Evaluation  Patient identified by MRN, date of birth, ID band Patient awake    Reviewed: Allergy & Precautions, NPO status , Patient's Chart, lab work & pertinent test results  Airway Mallampati: III  TM Distance: >3 FB Neck ROM: Full    Dental   Pulmonary asthma    Pulmonary exam normal breath sounds clear to auscultation       Cardiovascular hypertension, Pt. on medications  Rhythm:Regular Rate:Normal     Neuro/Psych  Headaches    GI/Hepatic negative GI ROS,,,  Endo/Other  diabetes, Type 2, Insulin Dependent  Morbid obesity  Renal/GU negative Renal ROS     Musculoskeletal   Abdominal  (+) + obese  Peds  Hematology  (+) Blood dyscrasia (gestational thrombocytopenia, alpha thalassemia silent carrier) Lab Results      Component                Value               Date                      WBC                      9.0                 05/20/2023                HGB                      14.1                05/20/2023                HCT                      42.1                05/20/2023                MCV                      83.2                05/20/2023                PLT                      104 (L)             05/20/2023              Anesthesia Other Findings   Reproductive/Obstetrics (+) Pregnancy                             Anesthesia Physical Anesthesia Plan  ASA: 3  Anesthesia Plan: Epidural   Post-op Pain Management:    Induction:   PONV Risk Score and Plan:   Airway Management Planned:   Additional Equipment:   Intra-op Plan:   Post-operative Plan:   Informed Consent: I have reviewed the patients History and Physical, chart, labs and discussed the procedure including the risks, benefits and alternatives for the proposed anesthesia with the patient or authorized representative who has indicated his/her understanding and acceptance.       Plan Discussed  with: Anesthesiologist  Anesthesia Plan Comments: (I have discussed risks  of neuraxial anesthesia including but not limited to infection, bleeding, nerve injury, back pain, headache, seizures, and failure of block. Patient denies bleeding disorders and is not currently anticoagulated. Labs have been reviewed. Risks and benefits discussed. All patient's questions answered.   Discussed with nurse that patient will need a repeat CBC prior to epidural removal.)       Anesthesia Quick Evaluation

## 2023-05-21 NOTE — Anesthesia Procedure Notes (Signed)
Epidural Patient location during procedure: OB Start time: 05/21/2023 2:15 AM End time: 05/21/2023 2:21 AM  Staffing Anesthesiologist: Linton Rump, MD Performed: anesthesiologist   Preanesthetic Checklist Completed: patient identified, IV checked, site marked, risks and benefits discussed, surgical consent, monitors and equipment checked, pre-op evaluation and timeout performed  Epidural Patient position: sitting Prep: DuraPrep and site prepped and draped Patient monitoring: continuous pulse ox and blood pressure Approach: midline Location: L3-L4 Injection technique: LOR saline  Needle:  Needle type: Tuohy  Needle gauge: 17 G Needle length: 9 cm and 9 Needle insertion depth: 8 cm Catheter type: closed end flexible Catheter size: 19 Gauge Catheter at skin depth: 13 cm Test dose: negative  Assessment Events: blood not aspirated, no cerebrospinal fluid, injection not painful, no injection resistance, no paresthesia and negative IV test  Additional Notes The patient has requested an epidural for labor pain management. Risks and benefits including, but not limited to, infection, bleeding, local anesthetic toxicity, headache, hypotension, back pain, block failure, etc. were discussed with the patient. The patient expressed understanding and consented to the procedure. I confirmed that the patient has no bleeding disorders and is not taking blood thinners. I confirmed the patient's last platelet count with the nurse. A time-out was performed immediately prior to the procedure. Please see nursing documentation for vital signs. Sterile technique was used throughout the whole procedure. Once LOR achieved, the epidural catheter threaded easily without resistance. Aspiration of the catheter was negative for blood and CSF. The epidural was dosed slowly and an infusion was started.  1 attempt(s)Reason for block:procedure for pain

## 2023-05-21 NOTE — Inpatient Diabetes Management (Signed)
Inpatient Diabetes Program Recommendations  Diabetes Treatment Program Recommendations  ADA Standards of Care Diabetes in Pregnancy Target Glucose Ranges:  Fasting: 70 - 95 mg/dL 1 hr postprandial: Less than 140mg /dL (from first bite of meal) 2 hr postprandial: Less than 120 mg/dL (from first bite of meal)    Lab Results  Component Value Date   GLUCAP 144 (H) 05/21/2023   HGBA1C 9.5 (H) 04/11/2023    Review of Glycemic Control  Latest Reference Range & Units 05/20/23 13:50 05/21/23 05:30  Glucose-Capillary 70 - 99 mg/dL 89 102 (H)  (H): Data is abnormally high Diabetes history: Type 2 DM  Outpatient Diabetes medications: NPH 46 units BID, Novolog 20 units TID Current orders for Inpatient glycemic control: IV insulin  Inpatient Diabetes Program Recommendations:    Noted plan to start IV insulin. In agreement.   At time of delivery consider: - NPH 16 units BID - Novolog 10 units TID (assuming patient consuming >50% of meals) - Novolog 0-9 units TID & HS  Thanks, Lujean Rave, MSN, RNC-OB Diabetes Coordinator [phone] (8a-5p)

## 2023-05-21 NOTE — Discharge Summary (Signed)
Postpartum Discharge Summary  Date of Service updated: 05/22/23     Patient Name: Monique Schmidt DOB: 1985/10/03 MRN: [id number]  Date of admission: 05/20/2023 Delivery date:05/21/2023 Delivering provider: Dorathy Kinsman Date of discharge: 05/22/2023  Admitting diagnosis: Type 2 diabetes mellitus affecting pregnancy in third trimester, antepartum [O24.113] Intrauterine pregnancy: [redacted]w[redacted]d     Secondary diagnosis:  Principal Problem:   Type 2 diabetes mellitus affecting pregnancy in third trimester, antepartum Active Problems:   BMI 50.0-59.9, adult (HCC)   Chronic hypertension affecting pregnancy   Fetal growth restriction antepartum   Gestational thrombocytopenia (HCC)   Vaginal delivery  Additional problems: NA    Discharge diagnosis: Preterm Pregnancy Delivered, CHTN, Type 2 DM, and Thrombocytopenia                                               Post partum procedures: NA Augmentation: AROM, Pitocin, and IP Foley Complications: None  Hospital course: Induction of Labor With Vaginal Delivery   37 y.o. yo [id number] at [redacted]w[redacted]d was admitted to the hospital 05/20/2023 for induction of labor.  Indication for induction: TYPE 2 DM and non-reassuring fetal status on antenatal testing and FGR .  Patient had an labor course complicated by protracted latent phase. Endotool for Type 2 DM Membrane Rupture Time/Date: 9:30 AM,05/21/2023  Delivery Method:Vaginal, Spontaneous Operative Delivery:N/A Episiotomy: None Lacerations:  None Details of delivery can be found in separate delivery note.  Patient had a unremarkable postpartum course. She was started on Lasix and Procardia for BP control. She was started on Lantus for glucose control as well. She responded well to each of these interventions. She progressed to ambulating, voiding, + flatus, tolerating diet and good oral pain control. Felt amendable for discharge home on PPD # 2. Discharge instructions, medications and follow up reviewed with pt.  She verbalized understanding. Patient is discharged home 05/22/23.  Newborn Data: Birth date:05/21/2023 Birth time:2:16 PM Gender:Female Living status:Living Apgars:6 ,9  Weight:2420 g  Magnesium Sulfate received: No BMZ received: No Rhophylac:N/A MMR:N/A T-DaP: NA Flu: N/A RSV Vaccine received: No Transfusion:No  Immunizations received: Immunization History  Administered Date(s) Administered   Tdap 06/27/2012, 11/09/2013, 12/12/2015    Physical exam  Vitals:   05/21/23 1715 05/21/23 2112 05/22/23 0115 05/22/23 0520  BP: (!) 137/96 123/83 121/78 129/81  Pulse: 97 92  89  Resp: 18 18 17 18   Temp: 98.3 F (36.8 C) 98.2 F (36.8 C) 98.5 F (36.9 C) 98.3 F (36.8 C)  TempSrc: Oral  Oral Oral  SpO2: 99% 99% 99% 99%  Weight:      Height:       General: alert Lochia: appropriate Uterine Fundus: firm Incision: N/A DVT Evaluation: No evidence of DVT seen on physical exam. Labs: Lab Results  Component Value Date   WBC 8.7 05/22/2023   HGB 11.1 (L) 05/22/2023   HCT 33.4 (L) 05/22/2023   MCV 84.1 05/22/2023   PLT 89 (L) 05/22/2023      Latest Ref Rng & Units 05/21/2023    4:41 PM  CMP  Creatinine 0.44 - 1.00 mg/dL 1.30    Edinburgh Score:    05/21/2023    9:17 PM  Edinburgh Postnatal Depression Scale Screening Tool  I have been able to laugh and see the funny side of things. --   No data recorded  After visit meds:  Allergies as of 05/22/2023       Reactions   Latex Itching, Rash   Penicillins Itching, Rash   Did it involve swelling of the face/tongue/throat, SOB, or low BP? N Did it involve sudden or severe rash/hives, skin peeling, or any reaction on the inside of your mouth or nose? Y Did you need to seek medical attention at a hospital or doctor's office? No When did it last happen?  Several  Years Ago    If all above answers are "NO", may proceed with cephalosporin use.        Medication List     STOP taking these medications     acetaminophen 500 MG tablet Commonly known as: TYLENOL   aspirin EC 81 MG tablet   BD Insulin Syringe U/F 31G X 5/16" 1 ML Misc Generic drug: Insulin Syringe-Needle U-100   cyclobenzaprine 10 MG tablet Commonly known as: FLEXERIL   Dexcom G7 Sensor Misc   insulin NPH Human 100 UNIT/ML injection Commonly known as: NOVOLIN N   NovoLOG FlexPen 100 UNIT/ML FlexPen Generic drug: insulin aspart       TAKE these medications    Accu-Chek Guide test strip Generic drug: glucose blood Use four times per day as instructed   Accu-Chek Softclix Lancets lancets Use four times per day as instructed   BD Pen Needle Nano 2nd Gen 32G X 4 MM Misc Generic drug: Insulin Pen Needle Inject into the skin 2 (two) times daily.   blood glucose meter kit and supplies Kit Dispense based on patient and insurance preference. Use up to four times daily as directed.   furosemide 20 MG tablet Commonly known as: LASIX Take 1 tablet (20 mg total) by mouth 2 (two) times daily.   ibuprofen 600 MG tablet Commonly known as: ADVIL Take 1 tablet (600 mg total) by mouth every 6 (six) hours.   insulin glargine-yfgn 100 UNIT/ML injection Commonly known as: SEMGLEE Inject 0.1 mLs (10 Units total) into the skin at bedtime.   NIFEdipine 60 MG 24 hr tablet Commonly known as: ADALAT CC Take 1 tablet (60 mg total) by mouth daily. What changed:  medication strength how much to take   oxyCODONE-acetaminophen 5-325 MG tablet Commonly known as: PERCOCET/ROXICET Take 1 tablet by mouth every 4 (four) hours as needed (pain scale 4-7).   Ventolin HFA 108 (90 Base) MCG/ACT inhaler Generic drug: albuterol Inhale 1-2 puffs into the lungs every 6 (six) hours as needed for wheezing or shortness of breath.   Vitafol Gummies 3.33-0.333-34.8 MG Chew Chew 3 tablets by mouth daily.         Discharge home in stable condition Infant Feeding: Breast Infant Disposition:home with mother Discharge instruction:  per After Visit Summary and Postpartum booklet. Activity: Advance as tolerated. Pelvic rest for 6 weeks.  Diet: carb modified diet Future Appointments: Future Appointments  Date Time Provider Department Center  05/23/2023  8:15 AM [facility] NURSE [facility]  05/23/2023  8:30 AM [facility] US1 [facility]  05/27/2023 10:30 AM [facility] NURSE [facility]  05/27/2023 10:45 AM [facility] NST [facility]  05/30/2023  9:15 AM [facility] NURSE [facility]  05/30/2023  9:30 AM [facility] US5 [facility]   Follow up Visit:  Follow-up Information     [facility] Follow up.   Specialty: Obstetrics and Gynecology Contact information: [address] [place] [address] [phone]  Please schedule this patient for a In person postpartum visit in 6 weeks with the following provider: Any provider. Additional Postpartum F/U:BP check 2-3 days  High risk pregnancy complicated by: GDM and HTN Delivery mode:  Vaginal, Spontaneous Anticipated Birth Control:  Unsure   05/22/2023 Hermina Staggers, MD

## 2023-05-21 NOTE — Progress Notes (Addendum)
Monique Schmidt is a 37 y.o. [id number] at [redacted]w[redacted]d.  Subjective: Not feeling contractions.   Objective: BP (!) 152/90   Pulse 97   Temp 98.3 F (36.8 C) (Oral)   Resp 18   Ht 5\' 6"  (1.676 m)   Wt (!) 141.1 kg   LMP 09/09/2022 (Approximate)   SpO2 96%   BMI 50.20 kg/m    Patient Vitals for the past 24 hrs:  BP Temp Temp src Pulse Resp SpO2 Height Weight  05/21/23 0950 (!) 152/90 -- -- 97 -- -- -- --  05/21/23 0910 (!) 140/73 -- -- 77 -- -- -- --  05/21/23 0832 120/70 -- -- 75 -- -- -- --  05/21/23 0800 (!) 140/91 -- -- 79 -- -- -- --  05/21/23 0740 -- 98.3 F (36.8 C) Oral -- -- -- -- --   .vshosp  FHT:  FHR: 145 bpm, variability: mod,  accelerations:  10x10,  decelerations:  few variables UC:   ~Q 3-4 minutes, minutes, mod. Very difficult to assess due to maternal body habitus. Monitors adjusted frequently.  Dilation: 5 Effacement (%): 80 Station: -3 Presentation: Vertex Exam by:: Dorathy Kinsman, CNM AROM mod amount of light meconium stained fluid. IUPC placed w/out difficulty.   Labs: Results for orders placed or performed during the hospital encounter of 05/20/23 (from the past 24 hour(s))  Urinalysis, Routine w reflex microscopic -Urine, Clean Catch     Status: Abnormal   Collection Time: 05/20/23 10:23 AM  Result Value Ref Range   Color, Urine YELLOW YELLOW   APPearance CLEAR CLEAR   Specific Gravity, Urine 1.013 1.005 - 1.030   pH 6.0 5.0 - 8.0   Glucose, UA NEGATIVE NEGATIVE mg/dL   Hgb urine dipstick NEGATIVE NEGATIVE   Bilirubin Urine NEGATIVE NEGATIVE   Ketones, ur NEGATIVE NEGATIVE mg/dL   Protein, ur NEGATIVE NEGATIVE mg/dL   Nitrite NEGATIVE NEGATIVE   Leukocytes,Ua TRACE (A) NEGATIVE   RBC / HPF 0-5 0 - 5 RBC/hpf   WBC, UA 0-5 0 - 5 WBC/hpf   Bacteria, UA RARE (A) NONE SEEN   Squamous Epithelial / HPF 0-5 0 - 5 /HPF   Mucus PRESENT   Glucose, capillary     Status: Abnormal   Collection Time: 05/20/23 10:42 AM  Result Value Ref Range    Glucose-Capillary 126 (H) 70 - 99 mg/dL  Type and screen     Status: None (Preliminary result)   Collection Time: 05/20/23 11:12 AM  Result Value Ref Range   ABO/RH(D) A POS    Antibody Screen POS    Sample Expiration 05/23/2023,2359    Antibody Identification ANTI M    PT AG Type NEGATIVE FOR M ANTIGEN    Unit Number [id number]    Blood Component Type RED CELLS,LR    Unit division 00    Status of Unit ALLOCATED    Donor AG Type NEGATIVE FOR M ANTIGEN    Transfusion Status OK TO TRANSFUSE    Crossmatch Result COMPATIBLE    Unit Number [id number]    Blood Component Type RED CELLS,LR    Unit division 00    Status of Unit ALLOCATED    Donor AG Type NEGATIVE FOR M ANTIGEN    Transfusion Status OK TO TRANSFUSE    Crossmatch Result COMPATIBLE   CBC     Status: Abnormal   Collection Time: 05/20/23 11:13 AM  Result Value Ref Range   WBC 7.9 4.0 - 10.5 K/uL   RBC 4.92 3.87 -  5.11 MIL/uL   Hemoglobin 13.2 12.0 - 15.0 g/dL   HCT 16.1 09.6 - 04.5 %   MCV 81.9 80.0 - 100.0 fL   MCH 26.8 26.0 - 34.0 pg   MCHC 32.8 30.0 - 36.0 g/dL   RDW 40.9 81.1 - 91.4 %   Platelets 106 (L) 150 - 400 K/uL   nRBC 0.0 0.0 - 0.2 %  RPR     Status: None   Collection Time: 05/20/23 11:13 AM  Result Value Ref Range   RPR Ser Ql NON REACTIVE NON REACTIVE  Glucose, capillary     Status: None   Collection Time: 05/20/23  1:50 PM  Result Value Ref Range   Glucose-Capillary 89 70 - 99 mg/dL  CBC with Differential/Platelet     Status: Abnormal   Collection Time: 05/20/23  8:48 PM  Result Value Ref Range   WBC 9.0 4.0 - 10.5 K/uL   RBC 5.06 3.87 - 5.11 MIL/uL   Hemoglobin 14.1 12.0 - 15.0 g/dL   HCT 78.2 95.6 - 21.3 %   MCV 83.2 80.0 - 100.0 fL   MCH 27.9 26.0 - 34.0 pg   MCHC 33.5 30.0 - 36.0 g/dL   RDW 08.6 57.8 - 46.9 %   Platelets 104 (L) 150 - 400 K/uL   nRBC 0.0 0.0 - 0.2 %   Neutrophils Relative % 56 %   Neutro Abs 5.0 1.7 - 7.7 K/uL   Lymphocytes Relative 35 %   Lymphs Abs 3.2 0.7 -  4.0 K/uL   Monocytes Relative 5 %   Monocytes Absolute 0.5 0.1 - 1.0 K/uL   Eosinophils Relative 4 %   Eosinophils Absolute 0.4 0.0 - 0.5 K/uL   Basophils Relative 0 %   Basophils Absolute 0.0 0.0 - 0.1 K/uL   nRBC 0 0 /100 WBC   Abs Immature Granulocytes 0.00 0.00 - 0.07 K/uL  Glucose, capillary     Status: Abnormal   Collection Time: 05/21/23  5:30 AM  Result Value Ref Range   Glucose-Capillary 144 (H) 70 - 99 mg/dL    Assessment / Plan: [redacted]w[redacted]d week IUP Labor: Early, Now AROM'd w/ IUPC. Titrate pitocin to achieve adequate labor.  Fetal Wellbeing:  Category I Pain Control:  Epidural Anticipated MOD:  SVD Type 2 DM (Insulin): Mild hyperglycemia. Start Endotool due to moving into active labor, not eating.  CHTN: No evidence of Pre-E. Continue Procardia  Katrinka Blazing, [place], [place] 05/21/2023 9:55 AM

## 2023-05-21 NOTE — Progress Notes (Signed)
Patient Vitals for the past 4 hrs:  BP Temp Temp src Pulse Resp SpO2 Height Weight  05/21/23 0320 (!) 91/48 -- -- 77 -- -- -- --  05/21/23 0315 (!) 118/100 -- -- 89 -- -- -- --  05/21/23 0312 (!) 88/33 -- -- 85 -- -- -- --  05/21/23 0251 126/86 -- -- 82 -- 96 % -- --  05/21/23 0247 (!) 95/55 -- -- (!) 112 -- -- -- --  05/21/23 0246 -- -- -- -- -- 99 % -- --  05/21/23 0242 113/69 -- -- (!) 120 -- -- -- --  05/21/23 0241 -- -- -- -- -- 98 % -- --  05/21/23 0236 122/71 -- -- (!) 104 -- 98 % -- --  05/21/23 0232 (!) 121/99 -- -- (!) 113 -- -- -- --  05/21/23 0231 -- -- -- -- -- 99 % -- --  05/21/23 0227 (!) 152/114 -- -- 97 -- -- -- --  05/21/23 0226 -- -- -- -- -- 99 % -- --  05/21/23 0222 (!) 156/106 -- -- 90 -- -- -- --  05/21/23 0221 -- -- -- -- -- 99 % -- --  05/21/23 0216 -- -- -- -- -- 99 % -- --  05/21/23 0144 -- -- -- -- -- -- 5\' 6"  (1.676 m) (!) 141.1 kg  05/21/23 0106 (!) 145/90 -- -- 88 19 -- -- --  05/20/23 2355 130/83 97.8 F (36.6 C) Oral 85 17 -- -- --   Balloon out, wanted epidural prior to AROM. Comfortable w/epidural.  Had some BP related decels after epidural, responded to position changes, IVF, phenylephrine. Cx 5cms, but vtx (reconfirmed w/US) not reachable.  Pitocin at 30mu/min, will try again when head comes down in pelvis.

## 2023-05-22 LAB — CULTURE, BETA STREP (GROUP B ONLY)

## 2023-05-22 LAB — CBC
HCT: 33.4 % — ABNORMAL LOW (ref 36.0–46.0)
Hemoglobin: 11.1 g/dL — ABNORMAL LOW (ref 12.0–15.0)
MCH: 28 pg (ref 26.0–34.0)
MCHC: 33.2 g/dL (ref 30.0–36.0)
MCV: 84.1 fL (ref 80.0–100.0)
Platelets: 89 10*3/uL — ABNORMAL LOW (ref 150–400)
RBC: 3.97 MIL/uL (ref 3.87–5.11)
RDW: 15.1 % (ref 11.5–15.5)
WBC: 8.7 10*3/uL (ref 4.0–10.5)
nRBC: 0 % (ref 0.0–0.2)

## 2023-05-22 MED ORDER — FUROSEMIDE 20 MG PO TABS: 20.0000 mg | ORAL_TABLET | Freq: Two times a day (BID) | ORAL | 0 refills | Status: DC

## 2023-05-22 MED ORDER — IBUPROFEN 800 MG PO TABS
400.0000 mg | ORAL_TABLET | Freq: Four times a day (QID) | ORAL | Status: DC
  Administered 2023-05-22 – 2023-05-23 (×3): 400 mg via ORAL
  Filled 2023-05-22 (×3): qty 1

## 2023-05-22 MED ORDER — OXYCODONE-ACETAMINOPHEN 5-325 MG PO TABS: 1.0000 | ORAL_TABLET | ORAL | 0 refills | Status: DC | PRN

## 2023-05-22 MED ORDER — IBUPROFEN 600 MG PO TABS: 600.0000 mg | ORAL_TABLET | Freq: Four times a day (QID) | ORAL | 1 refills | Status: DC

## 2023-05-22 MED ORDER — INSULIN GLARGINE-YFGN 100 UNIT/ML ~~LOC~~ SOLN: 10.0000 [IU] | Freq: Every day | SUBCUTANEOUS | 11 refills | Status: DC

## 2023-05-22 MED ORDER — NIFEDIPINE ER 60 MG PO TB24: 60.0000 mg | ORAL_TABLET | Freq: Every day | ORAL | 1 refills | Status: DC

## 2023-05-22 NOTE — Lactation Note (Signed)
This note was copied from a baby's chart. Lactation Consultation Note  Patient Name: Monique Schmidt [id number] Date: 05/22/2023 Age:39 hours  Reason for consult: Follow-up assessment;Late-preterm 34-36.6wks;Infant < 6lbs  P6, ETI, [redacted]w[redacted]d, <1% weight loss  Baby has been bottle feeding well. SLP has started baby on Dr. Theora Gianotti bottle. Mother states she hasn't started pumping yet and may start that tonight.    Feeding Mother's Current Feeding Choice: Breast Milk and Formula Nipple Type: Dr. Lorne Skeens   Consult Status Consult Status: Follow-up Date: 05/23/23    Omar Person 05/22/2023, 7:11 PM

## 2023-05-22 NOTE — Progress Notes (Signed)
Patient ID: Monique Schmidt, female   DOB: 1986/05/24, 37 y.o.   MRN: [id number] Attending Circumcision Counseling Progress Note  Patient desires circumcision for her female infant.  Circumcision procedure details discussed, risks and benefits of procedure were also discussed.  These include but are not limited to: Benefits of circumcision in men include reduction in the rates of urinary tract infection (UTI), penile cancer, some sexually transmitted infections, penile inflammatory and retractile disorders, as well as easier hygiene.  Risks include bleeding , infection, injury of glans which may lead to penile deformity or urinary tract issues, unsatisfactory cosmetic appearance and other potential complications related to the procedure.  It was emphasized that this is an elective procedure.  Patient wants to proceed with circumcision; written informed consent obtained.  Will do circumcision soon, routine circumcision and post circumcision care ordered for the infant.  Lisaann Atha L. Alysia Penna, M.D. 05/22/2023 9:12 AM

## 2023-05-22 NOTE — Progress Notes (Signed)
    Faculty Practice OB/GYN Attending Note  Subjective:  Was told that patient no longer wanted to go home as planned earlier given baby is staying one more day. No concerning symptoms.    Objective:  Blood pressure 129/81, pulse 89, temperature 98.3 F (36.8 C), temperature source Oral, resp. rate 18, height 5\' 6"  (1.676 m), weight (!) 141.1 kg, last menstrual period 09/09/2022, SpO2 99%. Exam done earlier by Dr. Alysia Penna General: alert Lochia: appropriate Uterine Fundus: firm Incision: N/A DVT Evaluation: No evidence of DVT seen on physical exam.  Assessment & Plan:  37 y.o. [id number] PPD#1 s/p SVD at [redacted]w[redacted]d in the setting of NRFHT, IUGR, CHTN, T2DM. - Continue Nifedipine XL 60 mg daily for BP control, also Lasix 20 mg bid - Continue Semglee 10 units at bedtime for T2DM and  Metformin 500 mg  po bid - Routine postpartum care   Jaynie Collins, MD, FACOG Obstetrician & Gynecologist, [facility] [employer], [facility]

## 2023-05-22 NOTE — Anesthesia Postprocedure Evaluation (Signed)
Anesthesia Post Note  Patient: SOILA PRINTUP  Procedure(s) Performed: AN AD HOC LABOR EPIDURAL     Patient location during evaluation: Mother Baby Anesthesia Type: Epidural Level of consciousness: awake, oriented and awake and alert Pain management: pain level controlled Vital Signs Assessment: post-procedure vital signs reviewed and stable Respiratory status: spontaneous breathing, respiratory function stable and nonlabored ventilation Cardiovascular status: stable Postop Assessment: no headache, adequate PO intake, patient able to bend at knees, able to ambulate and no apparent nausea or vomiting Anesthetic complications: no   No notable events documented.  Last Vitals:  Vitals:   05/22/23 0115 05/22/23 0520  BP: 121/78 129/81  Pulse:  89  Resp: 17 18  Temp: 36.9 C 36.8 C  SpO2: 99% 99%    Last Pain:  Vitals:   05/22/23 0553  TempSrc:   PainSc: 0-No pain   Pain Goal: Patients Stated Pain Goal: 2 (05/21/23 1715)                 Carnie Bruemmer

## 2023-05-23 ENCOUNTER — Other Ambulatory Visit (HOSPITAL_COMMUNITY): Payer: Self-pay

## 2023-05-23 ENCOUNTER — Other Ambulatory Visit: Payer: Self-pay

## 2023-05-23 ENCOUNTER — Ambulatory Visit: Payer: Medicaid Other

## 2023-05-23 LAB — CBC
HCT: 35.9 % — ABNORMAL LOW (ref 36.0–46.0)
Hemoglobin: 11.7 g/dL — ABNORMAL LOW (ref 12.0–15.0)
MCH: 27 pg (ref 26.0–34.0)
MCHC: 32.6 g/dL (ref 30.0–36.0)
MCV: 82.9 fL (ref 80.0–100.0)
Platelets: 104 10*3/uL — ABNORMAL LOW (ref 150–400)
RBC: 4.33 MIL/uL (ref 3.87–5.11)
RDW: 15.1 % (ref 11.5–15.5)
WBC: 9.2 10*3/uL (ref 4.0–10.5)
nRBC: 0 % (ref 0.0–0.2)

## 2023-05-23 MED ORDER — INSULIN PEN NEEDLE 32G X 4 MM MISC
1.0000 | 4 refills | Status: DC | PRN
Start: 2023-05-23 — End: 2024-04-16
  Filled 2023-05-23 (×2): qty 100, 25d supply, fill #0
  Filled 2023-05-23: qty 100, fill #0

## 2023-05-23 MED ORDER — INSULIN GLARGINE 100 UNIT/ML SOLOSTAR PEN
10.0000 [IU] | PEN_INJECTOR | Freq: Every day | SUBCUTANEOUS | 6 refills | Status: DC
  Filled 2023-05-23: qty 3, 28d supply, fill #0

## 2023-05-23 MED ORDER — NOVOLOG FLEXPEN 100 UNIT/ML ~~LOC~~ SOPN
10.0000 [IU] | PEN_INJECTOR | Freq: Three times a day (TID) | SUBCUTANEOUS | 11 refills | Status: DC
  Filled 2023-05-23: qty 15, 50d supply, fill #0

## 2023-05-23 NOTE — Progress Notes (Signed)
Patient ID: Monique Schmidt, female   DOB: 12-Jan-1986, 37 y.o.   MRN: [id number]  POSTPARTUM PROGRESS NOTE  Postpartum Day 2  Subjective:  Monique Schmidt is a 37 y.o. [id number] s/p SVD at [redacted]w[redacted]d.  No acute events overnight.  Pt denies problems with ambulating, voiding or po intake.  She denies nausea or vomiting.  Pain is well controlled.  She has had flatus. She has had bowel movement.  Lochia Small.   Objective: Blood pressure (!) 135/96, pulse 99, temperature (!) 97.5 F (36.4 C), temperature source Oral, resp. rate 18, height 5\' 6"  (1.676 m), weight (!) 311 lb (141.1 kg), last menstrual period 09/09/2022, SpO2 97%.  Physical Exam:  General: alert, cooperative and no distress Chest: no respiratory distress Heart:regular rate, distal pulses intact Abdomen: soft, nontender,  Uterine Fundus: firm, appropriately tender DVT Evaluation: No calf swelling or tenderness Extremities: trace edema Skin: warm, dry  Recent Labs    05/22/23 0509 05/23/23 0429  HGB 11.1* 11.7*  HCT 33.4* 35.9*   Assessment/Plan: Monique Schmidt is a 37 y.o. [id number] s/p SVD at [redacted]w[redacted]d   PPD#2 - Doing well Contraception: declined Feeding: formula and pumped breastmilk Dispo: Plan for discharge today, discharge summary already filled out. Changed insulin to CGM with 10u long-acting at night and 10u short acting meal coverage with insulin pens per Dr. Shawnie Pons. All meds sent to [facility] pharmacy and confirmed [employer] enrollment.   LOS: 3 days   Osborne Oman, CNM 05/23/2023, 12:18 PM

## 2023-05-24 ENCOUNTER — Other Ambulatory Visit: Payer: Self-pay

## 2023-05-24 LAB — TYPE AND SCREEN
ABO/RH(D): A POS
Antibody Screen: POSITIVE
Donor AG Type: NEGATIVE
Donor AG Type: NEGATIVE
PT AG Type: NEGATIVE
Unit division: 0
Unit division: 0

## 2023-05-24 LAB — BPAM RBC
Blood Product Expiration Date: 202410162359
Blood Product Expiration Date: 202410292359
ISSUE DATE / TIME: 202410021754
Unit Type and Rh: 6200
Unit Type and Rh: 6200

## 2023-05-27 ENCOUNTER — Ambulatory Visit: Payer: Medicaid Other

## 2023-05-27 LAB — SURGICAL PATHOLOGY

## 2023-05-28 ENCOUNTER — Other Ambulatory Visit: Payer: Self-pay

## 2023-05-29 ENCOUNTER — Ambulatory Visit: Payer: Medicaid Other

## 2023-05-29 ENCOUNTER — Inpatient Hospital Stay (HOSPITAL_COMMUNITY): Admit: 2023-05-29 | Payer: Medicaid Other | Admitting: Obstetrics & Gynecology

## 2023-05-29 ENCOUNTER — Inpatient Hospital Stay (HOSPITAL_COMMUNITY): Payer: Medicaid Other

## 2023-05-29 DIAGNOSIS — R456 Violent behavior: Secondary | ICD-10-CM | POA: Diagnosis not present

## 2023-05-30 ENCOUNTER — Other Ambulatory Visit: Payer: Self-pay | Admitting: Obstetrics and Gynecology

## 2023-05-30 ENCOUNTER — Ambulatory Visit: Payer: Medicaid Other

## 2023-05-31 ENCOUNTER — Other Ambulatory Visit: Payer: Self-pay | Admitting: Obstetrics and Gynecology

## 2023-06-07 ENCOUNTER — Other Ambulatory Visit: Payer: Self-pay

## 2023-06-10 ENCOUNTER — Ambulatory Visit
Admission: EM | Admit: 2023-06-10 | Discharge: 2023-06-10 | Disposition: A | Discharge status: Home / Self Care | Payer: Medicaid Other | Attending: Internal Medicine | Admitting: Internal Medicine

## 2023-06-10 DIAGNOSIS — B9789 Other viral agents as the cause of diseases classified elsewhere: Secondary | ICD-10-CM

## 2023-06-10 DIAGNOSIS — J988 Other specified respiratory disorders: Secondary | ICD-10-CM

## 2023-06-10 DIAGNOSIS — J4540 Moderate persistent asthma, uncomplicated: Secondary | ICD-10-CM | POA: Diagnosis not present

## 2023-06-10 MED ORDER — PROMETHAZINE-DM 6.25-15 MG/5ML PO SYRP
5.0000 mL | ORAL_SOLUTION | Freq: Three times a day (TID) | ORAL | 0 refills | Status: DC | PRN
Start: 2023-06-10 — End: 2023-06-28

## 2023-06-10 MED ORDER — ALBUTEROL SULFATE HFA 108 (90 BASE) MCG/ACT IN AERS: 1.0000 | INHALATION_SPRAY | Freq: Four times a day (QID) | RESPIRATORY_TRACT | 1 refills | Status: DC | PRN

## 2023-06-10 MED ORDER — PREDNISONE 10 MG PO TABS
30.0000 mg | ORAL_TABLET | Freq: Every day | ORAL | 0 refills | Status: DC
Start: 2023-06-10 — End: 2023-06-28

## 2023-06-10 NOTE — ED Triage Notes (Signed)
Pt c/o nonprod cough x 3-4 days-denies known fever-pt states she id 20 days postpartum-NAD-steady gait

## 2023-06-10 NOTE — ED Provider Notes (Signed)
[facility]  Note:  This document was prepared using Conservation officer, historic buildings and may include unintentional dictation errors.  MRN: [id number] DOB: July 08, 1986  Subjective:   Monique Schmidt is a 37 y.o. female presenting for 4-day history of a persistent dry cough, chest tightness, shortness of breath and wheezing.  Patient would like a refill of her albuterol inhaler.  She is 2 weeks postpartum, is not breast-feeding.  Does not want a COVID test.  Has had multiple sick contacts at home with her children.  She is a diabetic, managed with insulin.  Has type 2 diabetes.  No current facility-administered medications for this encounter.  Current Outpatient Medications:    albuterol (VENTOLIN HFA) 108 (90 Base) MCG/ACT inhaler, Inhale 1-2 puffs into the lungs every 6 (six) hours as needed for wheezing or shortness of breath., Disp: 18 g, Rfl: 1   Insulin Pen Needle 32G X 4 MM MISC, Use 1 each as directed for injection into the skin as needed., Disp: 100 each, Rfl: 4   Continuous Glucose Sensor (DEXCOM G7 SENSOR) MISC, Replace sensor every 10 days, Disp: 3 each, Rfl: 5   furosemide (LASIX) 20 MG tablet, TAKE 1 TABLET(20 MG) BY MOUTH TWICE DAILY, Disp: 5 tablet, Rfl: 0   ibuprofen (ADVIL) 600 MG tablet, Take 1 tablet (600 mg total) by mouth every 6 (six) hours., Disp: 30 tablet, Rfl: 1   insulin aspart (NOVOLOG FLEXPEN) 100 UNIT/ML FlexPen, Inject 10 Units into the skin 3 (three) times daily with meals., Disp: 15 mL, Rfl: 11   insulin glargine (LANTUS) 100 UNIT/ML Solostar Pen, Inject 10 Units into the skin daily. Expires 28 days after first use, Disp: 30 mL, Rfl: 6   NIFEdipine (ADALAT CC) 60 MG 24 hr tablet, Take 1 tablet (60 mg total) by mouth daily., Disp: 30 tablet, Rfl: 1   oxyCODONE-acetaminophen (PERCOCET/ROXICET) 5-325 MG tablet, Take 1 tablet by mouth every 4 (four) hours as needed (pain scale 4-7)., Disp: 30 tablet, Rfl: 0   Prenatal Vit-Fe Phos-FA-Omega  (VITAFOL GUMMIES) 3.33-0.333-34.8 MG CHEW, Chew 3 tablets by mouth daily., Disp: 90 tablet, Rfl: 11   Allergies  Allergen Reactions   Latex Itching and Rash   Penicillins Itching and Rash    Did it involve swelling of the face/tongue/throat, SOB, or low BP? N Did it involve sudden or severe rash/hives, skin peeling, or any reaction on the inside of your mouth or nose? Y Did you need to seek medical attention at a hospital or doctor's office? No When did it last happen?  Several  Years Ago    If all above answers are "NO", may proceed with cephalosporin use.        Past Medical History:  Diagnosis Date   Abnormal maternal serum screening test 02/26/2023   HR due to low FF     Abnormal Pap smear    Bacterial vaginosis    Gestational diabetes    likely pre-exisiting; all pregnancies glyburide started 11/4   Gestational thrombocytopenia (HCC)    Headache(784.0)    Hernia, umbilical    History of abnormal cervical Pap smear    normal 2014   History of gestational diabetes 06/26/2019   Mild preeclampsia delivered 01/22/2016   Moderate persistent asthma 08/07/2013   Obesity complicating pregnancy 09/21/2013   Type 2 diabetes mellitus [facility])      Past Surgical History:  Procedure Laterality Date   UMBILICAL HERNIA REPAIR N/A 11/22/2014   Procedure: LAPAROSCOPIC UMBILICAL HERNIA;  Surgeon: Axel Filler, MD;  Location: [facility] OR;  Service: General;  Laterality: N/A;   WISDOM TOOTH EXTRACTION      Family History  Problem Relation Age of Onset   Stroke Mother    Diabetes Mother    Hypertension Father    Diabetes Father    Seizures Brother     Social History   Tobacco Use   Smoking status: Never   Smokeless tobacco: Never  Vaping Use   Vaping status: Never Used  Substance Use Topics   Alcohol use: No   Drug use: No    ROS   Objective:   Vitals: BP 103/66 (BP Location: Left Arm)   Pulse (!) 105   Temp 98.9 F (37.2 C) (Oral)   Resp 20   LMP 09/09/2022  (Approximate)   SpO2 95%   Breastfeeding No   Physical Exam Constitutional:      General: She is not in acute distress.    Appearance: Normal appearance. She is well-developed and normal weight. She is not ill-appearing, toxic-appearing or diaphoretic.  HENT:     Head: Normocephalic and atraumatic.     Right Ear: Tympanic membrane, ear canal and external ear normal. No drainage or tenderness. No middle ear effusion. There is no impacted cerumen. Tympanic membrane is not erythematous or bulging.     Left Ear: Tympanic membrane, ear canal and external ear normal. No drainage or tenderness.  No middle ear effusion. There is no impacted cerumen. Tympanic membrane is not erythematous or bulging.     Nose: Nose normal. No congestion or rhinorrhea.     Mouth/Throat:     Mouth: Mucous membranes are moist. No oral lesions.     Pharynx: No pharyngeal swelling, oropharyngeal exudate, posterior oropharyngeal erythema or uvula swelling.     Tonsils: No tonsillar exudate or tonsillar abscesses.  Eyes:     General: No scleral icterus.       Right eye: No discharge.        Left eye: No discharge.     Extraocular Movements: Extraocular movements intact.     Right eye: Normal extraocular motion.     Left eye: Normal extraocular motion.     Conjunctiva/sclera: Conjunctivae normal.  Cardiovascular:     Rate and Rhythm: Normal rate and regular rhythm.     Heart sounds: Normal heart sounds. No murmur heard.    No friction rub. No gallop.  Pulmonary:     Effort: Pulmonary effort is normal. No respiratory distress.     Breath sounds: No stridor. Wheezing (mild) present. No rhonchi or rales.  Chest:     Chest wall: No tenderness.  Musculoskeletal:     Cervical back: Normal range of motion and neck supple.  Lymphadenopathy:     Cervical: No cervical adenopathy.  Skin:    General: Skin is warm and dry.  Neurological:     General: No focal deficit present.     Mental Status: She is alert and oriented  to person, place, and time.  Psychiatric:        Mood and Affect: Mood normal.        Behavior: Behavior normal.     Assessment and Plan :   PDMP not reviewed this encounter.  1. Viral respiratory infection   2. Moderate persistent asthma, uncomplicated   3. Moderate persistent asthma, unspecified whether complicated    In the context of her asthma, respiratory symptoms I refilled her albuterol and recommended prednisone.  Otherwise, suspect viral  URI, viral syndrome. Physical exam findings reassuring and vital signs stable for discharge.  Will defer imaging for now.  Advised supportive care, offered symptomatic relief. Counseled patient on potential for adverse effects with medications prescribed/recommended today, ER and return-to-clinic precautions discussed, patient verbalized understanding.     Wallis Bamberg, PA-C 06/10/23 1758

## 2023-06-10 NOTE — Discharge Instructions (Signed)
We will manage this as a viral respiratory infection. For sore throat or cough try using a honey-based tea. Use 3 teaspoons of honey with juice squeezed from half lemon. Place shaved pieces of ginger into 1/2-1 cup of water and warm over stove top. Then mix the ingredients and repeat every 4 hours as needed. Please take Tylenol 500mg -650mg  once every 6 hours for fevers, aches and pains. Hydrate very well with at least 2 liters (64 ounces) of water. Eat light meals such as soups (chicken and noodles, chicken wild rice, vegetable).  Do not eat any foods that you are allergic to.  Start an antihistamine like Zyrtec (10mg  daily) for postnasal drainage, sinus congestion.  You can take this together with prednisone and albuterol. Make sure you monitor your blood sugars and if they go above 180 then stop the prednisone. Use cough syrup as needed.

## 2023-06-18 ENCOUNTER — Encounter (HOSPITAL_COMMUNITY): Payer: Self-pay | Admitting: Family Medicine

## 2023-06-18 ENCOUNTER — Telehealth (HOSPITAL_COMMUNITY): Payer: Self-pay | Admitting: *Deleted

## 2023-06-18 NOTE — Telephone Encounter (Signed)
06/18/2023  Name: MAYLIAH MERONEY MRN: [id number] DOB: 1986/04/03  Reason for Call:  Transition of Care Hospital Discharge Call  Contact Status: Patient Contact Status: Complete  Language assistant needed: Interpreter Mode: Interpreter Not Needed        Follow-Up Questions: Do You Have Any Concerns About Your Health As You Heal From Delivery?: No Do You Have Any Concerns About Your Infants Health?: Yes What Concerns Do You Have About Your Baby?: Baby had some congestion and she took him to ER for evaluation.  Baby was discharged home from ER, did not require admission.  Wonders if baby needs a different formula because he vomits frequently after feeds.  She plans to call pediatrician for advice.  Encouraged her to call.  Edinburgh Postnatal Depression Scale:  In the Past 7 Days: I have been able to laugh and see the funny side of things.: As much as I always could I have looked forward with enjoyment to things.: As much as I ever did I have blamed myself unnecessarily when things went wrong.: No, never I have been anxious or worried for no good reason.: No, not at all I have felt scared or panicky for no good reason.: No, not at all Things have been getting on top of me.: No, I have been coping as well as ever I have been so unhappy that I have had difficulty sleeping.: Not at all I have felt sad or miserable.: No, not at all I have been so unhappy that I have been crying.: No, never The thought of harming myself has occurred to me.: Never Inocente Salles Postnatal Depression Scale Total: 0  PHQ2-9 Depression Scale:     Discharge Follow-up: Edinburgh score requires follow up?: No Patient was advised of the following resources:: Breastfeeding Support Group, Support Group  Post-discharge interventions: Reviewed Newborn Safe Sleep Practices  Salena Saner, RN 06/18/2023 09:48

## 2023-06-28 ENCOUNTER — Ambulatory Visit: Payer: Medicaid Other | Admitting: Family Medicine

## 2023-06-28 ENCOUNTER — Other Ambulatory Visit (HOSPITAL_COMMUNITY)
Admission: RE | Admit: 2023-06-28 | Discharge: 2023-06-28 | Disposition: A | Discharge status: Home / Self Care | Payer: Medicaid Other | Source: Ambulatory Visit | Attending: Family Medicine | Admitting: Family Medicine

## 2023-06-28 ENCOUNTER — Encounter: Payer: Self-pay | Admitting: Family Medicine

## 2023-06-28 VITALS — BP 126/84 | HR 89 | Wt 306.0 lb

## 2023-06-28 DIAGNOSIS — B3731 Acute candidiasis of vulva and vagina: Secondary | ICD-10-CM | POA: Diagnosis not present

## 2023-06-28 DIAGNOSIS — M545 Low back pain, unspecified: Secondary | ICD-10-CM | POA: Diagnosis not present

## 2023-06-28 DIAGNOSIS — G8929 Other chronic pain: Secondary | ICD-10-CM | POA: Diagnosis not present

## 2023-06-28 NOTE — Progress Notes (Signed)
Post Partum Visit Note  Monique Schmidt is a 37 y.o. [phone] female who presents for a postpartum visit. She is 5 weeks postpartum following a normal spontaneous vaginal delivery.  I have fully reviewed the prenatal and intrapartum course. The delivery was at [redacted]w[redacted]d gestational weeks.  Anesthesia: epidural. Postpartum course has been uneventful. Baby is doing well. Baby is feeding by bottle - Simliac . Bleeding no bleeding. Bowel function is normal. Bladder function is normal. Patient is sexually active. Contraception method is none. Postpartum depression screening: negative.   The pregnancy intention screening data noted above was reviewed. Potential methods of contraception were discussed. The patient elected to proceed with No data recorded.   Edinburgh Postnatal Depression Scale - 06/28/23 1025       Edinburgh Postnatal Depression Scale:  In the Past 7 Days   I have been able to laugh and see the funny side of things. 0    I have looked forward with enjoyment to things. 0    I have blamed myself unnecessarily when things went wrong. 0    I have been anxious or worried for no good reason. 0    I have felt scared or panicky for no good reason. 0    Things have been getting on top of me. 0    I have been so unhappy that I have had difficulty sleeping. 0    I have felt sad or miserable. 0    I have been so unhappy that I have been crying. 0    The thought of harming myself has occurred to me. 0    Edinburgh Postnatal Depression Scale Total 0             Health Maintenance Due  Topic Date Due   FOOT EXAM  Never done   OPHTHALMOLOGY EXAM  Never done   COVID-19 Vaccine (1 - 2023-24 season) Never done    The following portions of the patient's history were reviewed and updated as appropriate: allergies, current medications, past family history, past medical history, past social history, past surgical history, and problem list.  Review of Systems Pertinent items noted in HPI  and remainder of comprehensive ROS otherwise negative.  Objective:  BP 126/84   Pulse 89   Wt (!) 306 lb (138.8 kg)   LMP 09/09/2022 (Approximate)   Breastfeeding No   BMI 49.39 kg/m    General:  alert, cooperative, and appears stated age   Breasts:  not indicated  Lungs: Comfortalbe on room air  Wound N/a  GU exam:  not indicated        Assessment:   Vaginal yeast infection - Plan: Cervicovaginal ancillary only( [place])  Normal postpartum exam.   Plan:   Essential components of care per ACOG recommendations:  1.  Mood and well being: Patient with negative depression screening today. Reviewed local resources for support.  - Patient tobacco use? No.   - hx of drug use? No.    2. Infant care and feeding:  -Patient currently breastmilk feeding? No.  -Social determinants of health (SDOH) reviewed in EPIC. No concerns  3. Sexuality, contraception and birth spacing - Patient does not know if she wants a pregnancy in the next year.  Desired family size is unsure number of children.  - Reviewed reproductive life planning. Reviewed contraceptive methods based on pt preferences and effectiveness.  Patient desired No Method - No Contraceptive Precautions today.   - Discussed birth spacing of 18 months  4. Sleep and fatigue -Encouraged family/partner/community support of 4 hrs of uninterrupted sleep to help with mood and fatigue  5. Physical Recovery  - Discussed patients delivery and complications. She describes her labor as good. - Patient had a Vaginal, no problems at delivery. Patient had no laceration. Perineal healing reviewed. Patient expressed understanding - Patient has urinary incontinence? No. - Patient is safe to resume physical and sexual activity  6.  Health Maintenance - HM due items addressed No - up to date - Last pap smear  Diagnosis  Date Value Ref Range Status  01/08/2022   Final   - Negative for intraepithelial lesion or malignancy (NILM)   Pap  smear not done at today's visit.  -Breast Cancer screening indicated? No.   7. Chronic Disease/Pregnancy Condition follow up: Hypertension and Gestational Diabetes - BP well controlled today on Nifed 60 XL. Recommended she continue. Shown how to schedule new PCP visit through MyChart as she does not have one currently. - DM2: follow up with PCP   Venora Maples, MD, MPH, FAAFP Attending Family Medicine Physician, [facility] Center for [facility], [facility]

## 2023-07-01 LAB — CERVICOVAGINAL ANCILLARY ONLY
Bacterial Vaginitis (gardnerella): NEGATIVE
Candida Glabrata: NEGATIVE
Candida Vaginitis: POSITIVE — AB
Chlamydia: NEGATIVE
Comment: NEGATIVE
Comment: NEGATIVE
Comment: NEGATIVE
Comment: NEGATIVE
Comment: NEGATIVE
Comment: NORMAL
Neisseria Gonorrhea: NEGATIVE
Trichomonas: NEGATIVE

## 2023-07-01 MED ORDER — FLUCONAZOLE 150 MG PO TABS: 150.0000 mg | ORAL_TABLET | Freq: Once | ORAL | 0 refills | Status: AC

## 2023-07-01 NOTE — Addendum Note (Signed)
Addended by: Merian Capron on: 07/01/2023 04:23 PM   Modules accepted: Orders

## 2023-07-18 ENCOUNTER — Other Ambulatory Visit: Payer: Self-pay

## 2023-07-18 ENCOUNTER — Emergency Department (HOSPITAL_COMMUNITY): Payer: Medicaid Other

## 2023-07-18 ENCOUNTER — Encounter (HOSPITAL_COMMUNITY): Payer: Self-pay

## 2023-07-18 ENCOUNTER — Emergency Department (HOSPITAL_COMMUNITY)
Admission: EM | Admit: 2023-07-18 | Discharge: 2023-07-18 | Disposition: A | Discharge status: Home / Self Care | Payer: Medicaid Other | Attending: Emergency Medicine | Admitting: Emergency Medicine

## 2023-07-18 DIAGNOSIS — S1093XA Contusion of unspecified part of neck, initial encounter: Secondary | ICD-10-CM | POA: Diagnosis not present

## 2023-07-18 DIAGNOSIS — M79604 Pain in right leg: Secondary | ICD-10-CM | POA: Insufficient documentation

## 2023-07-18 DIAGNOSIS — E119 Type 2 diabetes mellitus without complications: Secondary | ICD-10-CM | POA: Insufficient documentation

## 2023-07-18 DIAGNOSIS — R070 Pain in throat: Secondary | ICD-10-CM | POA: Diagnosis not present

## 2023-07-18 DIAGNOSIS — Z91040 Latex allergy status: Secondary | ICD-10-CM | POA: Insufficient documentation

## 2023-07-18 DIAGNOSIS — Z794 Long term (current) use of insulin: Secondary | ICD-10-CM | POA: Insufficient documentation

## 2023-07-18 DIAGNOSIS — J45909 Unspecified asthma, uncomplicated: Secondary | ICD-10-CM | POA: Diagnosis not present

## 2023-07-18 DIAGNOSIS — M25561 Pain in right knee: Secondary | ICD-10-CM | POA: Insufficient documentation

## 2023-07-18 DIAGNOSIS — R69 Illness, unspecified: Secondary | ICD-10-CM | POA: Diagnosis not present

## 2023-07-18 DIAGNOSIS — T71193A Asphyxiation due to mechanical threat to breathing due to other causes, assault, initial encounter: Secondary | ICD-10-CM

## 2023-07-18 DIAGNOSIS — S199XXA Unspecified injury of neck, initial encounter: Secondary | ICD-10-CM | POA: Diagnosis present

## 2023-07-18 LAB — I-STAT CHEM 8, ED
BUN: 12 mg/dL (ref 6–20)
Calcium, Ion: 1.22 mmol/L (ref 1.15–1.40)
Chloride: 98 mmol/L (ref 98–111)
Creatinine, Ser: 0.6 mg/dL (ref 0.44–1.00)
Glucose, Bld: 335 mg/dL — ABNORMAL HIGH (ref 70–99)
HCT: 48 % — ABNORMAL HIGH (ref 36.0–46.0)
Hemoglobin: 16.3 g/dL — ABNORMAL HIGH (ref 12.0–15.0)
Potassium: 3.8 mmol/L (ref 3.5–5.1)
Sodium: 137 mmol/L (ref 135–145)
TCO2: 27 mmol/L (ref 22–32)

## 2023-07-18 LAB — WET PREP, GENITAL
Clue Cells Wet Prep HPF POC: NONE SEEN
Sperm: NONE SEEN
Trich, Wet Prep: NONE SEEN
WBC, Wet Prep HPF POC: <10 (ref <10)
Yeast Wet Prep HPF POC: NONE SEEN

## 2023-07-18 LAB — PREGNANCY, URINE: Preg Test, Ur: NEGATIVE

## 2023-07-18 MED ORDER — NAPROXEN 500 MG PO TBEC: 500.0000 mg | DELAYED_RELEASE_TABLET | Freq: Two times a day (BID) | ORAL | 0 refills | Status: AC

## 2023-07-18 MED ORDER — IOHEXOL 350 MG/ML SOLN
75.0000 mL | Freq: Once | INTRAVENOUS | Status: AC | PRN
  Administered 2023-07-18: 75 mL via INTRAVENOUS

## 2023-07-18 MED ORDER — HYDROCODONE-ACETAMINOPHEN 5-325 MG PO TABS
1.0000 | ORAL_TABLET | ORAL | 0 refills | Status: DC | PRN
Start: 2023-07-18 — End: 2024-03-04

## 2023-07-18 MED ORDER — OXYCODONE-ACETAMINOPHEN 5-325 MG PO TABS
1.0000 | ORAL_TABLET | Freq: Once | ORAL | Status: AC
  Administered 2023-07-18: 1 via ORAL
  Filled 2023-07-18: qty 1
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
## 2023-07-19 LAB — GC/CHLAMYDIA PROBE AMP (~~LOC~~) NOT AT ARMC
Chlamydia: NEGATIVE
Comment: NEGATIVE
Comment: NORMAL
Neisseria Gonorrhea: NEGATIVE

## 2023-07-22 DIAGNOSIS — Z0289 Encounter for other administrative examinations: Secondary | ICD-10-CM

## 2023-07-24 ENCOUNTER — Ambulatory Visit (HOSPITAL_COMMUNITY)
Admission: EM | Admit: 2023-07-24 | Discharge: 2023-07-24 | Disposition: A | Discharge status: Home / Self Care | Payer: Medicaid Other | Attending: Internal Medicine | Admitting: Internal Medicine

## 2023-07-24 ENCOUNTER — Encounter (HOSPITAL_COMMUNITY): Payer: Self-pay

## 2023-07-24 DIAGNOSIS — N898 Other specified noninflammatory disorders of vagina: Secondary | ICD-10-CM | POA: Diagnosis not present

## 2023-07-24 DIAGNOSIS — M25561 Pain in right knee: Secondary | ICD-10-CM

## 2023-07-24 DIAGNOSIS — M542 Cervicalgia: Secondary | ICD-10-CM | POA: Diagnosis not present

## 2023-07-24 MED ORDER — HYDROCORTISONE 0.5 % EX CREA: 1.0000 | TOPICAL_CREAM | Freq: Two times a day (BID) | CUTANEOUS | 0 refills | Status: DC
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
## 2023-07-24 NOTE — Discharge Instructions (Addendum)
Please follow-up with orthopedist at provided phone number for neck pain and knee pain.  I have prescribed a topical cream to apply to outside of vaginal area for irritation.

## 2023-07-26 ENCOUNTER — Ambulatory Visit: Payer: Medicaid Other | Admitting: Nurse Practitioner

## 2023-08-12 ENCOUNTER — Ambulatory Visit: Payer: Medicaid Other | Admitting: Nurse Practitioner

## 2023-08-21 DIAGNOSIS — F4322 Adjustment disorder with anxiety: Secondary | ICD-10-CM | POA: Diagnosis not present

## 2023-08-21 DIAGNOSIS — F419 Anxiety disorder, unspecified: Secondary | ICD-10-CM | POA: Diagnosis not present

## 2023-08-25 DIAGNOSIS — F4322 Adjustment disorder with anxiety: Secondary | ICD-10-CM | POA: Diagnosis not present

## 2023-08-25 DIAGNOSIS — F419 Anxiety disorder, unspecified: Secondary | ICD-10-CM | POA: Diagnosis not present

## 2023-09-04 DIAGNOSIS — F4322 Adjustment disorder with anxiety: Secondary | ICD-10-CM | POA: Diagnosis not present

## 2023-09-04 DIAGNOSIS — F419 Anxiety disorder, unspecified: Secondary | ICD-10-CM | POA: Diagnosis not present

## 2023-09-07 DIAGNOSIS — F4322 Adjustment disorder with anxiety: Secondary | ICD-10-CM | POA: Diagnosis not present

## 2023-09-07 DIAGNOSIS — F419 Anxiety disorder, unspecified: Secondary | ICD-10-CM | POA: Diagnosis not present

## 2023-09-12 ENCOUNTER — Other Ambulatory Visit: Payer: Self-pay

## 2023-09-15 DIAGNOSIS — F419 Anxiety disorder, unspecified: Secondary | ICD-10-CM | POA: Diagnosis not present

## 2023-09-15 DIAGNOSIS — F4322 Adjustment disorder with anxiety: Secondary | ICD-10-CM | POA: Diagnosis not present

## 2023-09-17 DIAGNOSIS — F4322 Adjustment disorder with anxiety: Secondary | ICD-10-CM | POA: Diagnosis not present

## 2023-09-17 DIAGNOSIS — F419 Anxiety disorder, unspecified: Secondary | ICD-10-CM | POA: Diagnosis not present

## 2023-09-21 DIAGNOSIS — F4322 Adjustment disorder with anxiety: Secondary | ICD-10-CM | POA: Diagnosis not present

## 2023-09-21 DIAGNOSIS — F419 Anxiety disorder, unspecified: Secondary | ICD-10-CM | POA: Diagnosis not present

## 2023-09-22 ENCOUNTER — Emergency Department (HOSPITAL_COMMUNITY)
Admission: EM | Admit: 2023-09-22 | Discharge: 2023-09-23 | Disposition: A | Discharge status: Home / Self Care | Payer: Medicaid Other | Attending: Student | Admitting: Student

## 2023-09-22 ENCOUNTER — Other Ambulatory Visit: Payer: Self-pay

## 2023-09-22 ENCOUNTER — Encounter (HOSPITAL_COMMUNITY): Payer: Self-pay

## 2023-09-22 DIAGNOSIS — R112 Nausea with vomiting, unspecified: Secondary | ICD-10-CM | POA: Diagnosis not present

## 2023-09-22 DIAGNOSIS — R197 Diarrhea, unspecified: Secondary | ICD-10-CM | POA: Diagnosis not present

## 2023-09-22 DIAGNOSIS — Z20822 Contact with and (suspected) exposure to covid-19: Secondary | ICD-10-CM | POA: Insufficient documentation

## 2023-09-22 LAB — CBC
HCT: 41.7 % (ref 36.0–46.0)
Hemoglobin: 13.6 g/dL (ref 12.0–15.0)
MCH: 26.8 pg (ref 26.0–34.0)
MCHC: 32.6 g/dL (ref 30.0–36.0)
MCV: 82.2 fL (ref 80.0–100.0)
Platelets: 134 10*3/uL — ABNORMAL LOW (ref 150–400)
RBC: 5.07 MIL/uL (ref 3.87–5.11)
RDW: 14.4 % (ref 11.5–15.5)
WBC: 5.9 10*3/uL (ref 4.0–10.5)
nRBC: 0 % (ref 0.0–0.2)

## 2023-09-22 LAB — COMPREHENSIVE METABOLIC PANEL
ALT: 13 U/L (ref 0–44)
AST: 18 U/L (ref 15–41)
Albumin: 3.3 g/dL — ABNORMAL LOW (ref 3.5–5.0)
Alkaline Phosphatase: 66 U/L (ref 38–126)
Anion gap: 11 (ref 5–15)
BUN: 11 mg/dL (ref 6–20)
CO2: 26 mmol/L (ref 22–32)
Calcium: 9 mg/dL (ref 8.9–10.3)
Chloride: 100 mmol/L (ref 98–111)
Creatinine, Ser: 0.48 mg/dL (ref 0.44–1.00)
GFR, Estimated: >60 mL/min (ref >60)
Glucose, Bld: 415 mg/dL — ABNORMAL HIGH (ref 70–99)
Potassium: 3.3 mmol/L — ABNORMAL LOW (ref 3.5–5.1)
Sodium: 137 mmol/L (ref 135–145)
Total Bilirubin: 0.4 mg/dL (ref 0.0–1.2)
Total Protein: 6.7 g/dL (ref 6.5–8.1)

## 2023-09-22 LAB — HCG, SERUM, QUALITATIVE: Preg, Serum: NEGATIVE

## 2023-09-22 LAB — LIPASE, BLOOD: Lipase: 26 U/L (ref 11–51)

## 2023-09-22 MED ORDER — ACETAMINOPHEN 500 MG PO TABS
1000.0000 mg | ORAL_TABLET | Freq: Once | ORAL | Status: AC
  Administered 2023-09-22: 1000 mg via ORAL
  Filled 2023-09-22: qty 2

## 2023-09-22 MED ORDER — SODIUM CHLORIDE 0.9 % IV BOLUS
1000.0000 mL | Freq: Once | INTRAVENOUS | Status: AC
  Administered 2023-09-22: 1000 mL via INTRAVENOUS

## 2023-09-22 NOTE — ED Triage Notes (Signed)
Patient arrives POV c/o nausea and diarrhea x3 days. States that she also feels like she has a yeast infection from wiping too hard.

## 2023-09-22 NOTE — ED Provider Notes (Signed)
[place] [facility] AT [facility] Provider Note   CSN: [id number] Arrival date & time: 09/22/23  2119     History {Add pertinent medical, surgical, social history, OB history to HPI:1} Chief Complaint  Patient presents with   Nausea    Monique Schmidt is a 38 y.o. female.  Patient to ED with nausea, vomiting and diarrhea that started 3 days ago. No fever. She reports abdominal cramping type pain that is off and on, no pain currently. She vomited x 1 and had 2 episodes diarrhea today.   The history is provided by the patient. No language interpreter was used.       Home Medications Prior to Admission medications   Medication Sig Start Date End Date Taking? Authorizing Provider  albuterol (VENTOLIN HFA) 108 (90 Base) MCG/ACT inhaler Inhale 1-2 puffs into the lungs every 6 (six) hours as needed for wheezing or shortness of breath. Patient not taking: Reported on 06/28/2023 06/10/23   Wallis Bamberg, PA-C  Continuous Glucose Sensor (DEXCOM G7 SENSOR) MISC Replace sensor every 10 days Patient not taking: Reported on 06/28/2023 03/06/23   Reva Bores, MD  HYDROcodone-acetaminophen (NORCO/VICODIN) 5-325 MG tablet Take 1 tablet by mouth every 4 (four) hours as needed. 07/18/23   Clark, Meghan R, PA-C  hydrocortisone cream 0.5 % Apply 1 Application topically 2 (two) times daily. Apply to outer portion of vaginal area.  Do not apply to inner parts of vaginal area. 07/24/23   Gustavus Bryant, FNP  insulin aspart (NOVOLOG FLEXPEN) 100 UNIT/ML FlexPen Inject 10 Units into the skin 3 (three) times daily with meals. 05/23/23   Bernerd Limbo, CNM  insulin glargine (LANTUS) 100 UNIT/ML Solostar Pen Inject 10 Units into the skin daily. Expires 28 days after first use 05/23/23   Edd Arbour R, CNM  Insulin Pen Needle 32G X 4 MM MISC Use 1 each as directed for injection into the skin as needed. 05/23/23   Bernerd Limbo, CNM  NIFEdipine (ADALAT CC) 60 MG 24 hr tablet Take 1  tablet (60 mg total) by mouth daily. 05/22/23   Hermina Staggers, MD  Prenatal Vit-Fe Phos-FA-Omega (VITAFOL GUMMIES) 3.33-0.333-34.8 MG CHEW Chew 3 tablets by mouth daily. 12/13/22   Reva Bores, MD  insulin aspart (NOVOLOG) 100 UNIT/ML injection For use in Omnipod. Current TDD is 68 units daily. Patient not taking: Reported on 04/25/2023 03/19/23   Venora Maples, MD  medroxyPROGESTERone (PROVERA) 10 MG tablet Take 1 tablet (10 mg total) by mouth daily. 06/24/19 08/18/19  Reva Bores, MD      Allergies    Latex and Penicillins    Review of Systems   Review of Systems  Physical Exam Updated Vital Signs BP (!) 144/88 (BP Location: Right Arm)   Pulse 94   Temp 98.5 F (36.9 C) (Oral)   Resp 16   SpO2 98%  Physical Exam Constitutional:      General: She is not in acute distress.    Appearance: Normal appearance. She is well-developed. She is obese. She is not ill-appearing.  Pulmonary:     Effort: Pulmonary effort is normal.  Abdominal:     Palpations: Abdomen is soft.     Tenderness: There is no abdominal tenderness.  Musculoskeletal:        General: Normal range of motion.     Cervical back: Normal range of motion.  Skin:    General: Skin is warm and dry.  Neurological:  Mental Status: She is alert and oriented to person, place, and time.     ED Results / Procedures / Treatments   Labs (all labs ordered are listed, but only abnormal results are displayed) Labs Reviewed  RESP PANEL BY RT-PCR (RSV, FLU A&B, COVID)  RVPGX2  LIPASE, BLOOD  COMPREHENSIVE METABOLIC PANEL  CBC  URINALYSIS, ROUTINE W REFLEX MICROSCOPIC  HCG, SERUM, QUALITATIVE    EKG None  Radiology No results found.  Procedures Procedures  {Document cardiac monitor, telemetry assessment procedure when appropriate:1}  Medications Ordered in ED Medications  sodium chloride 0.9 % bolus 1,000 mL (has no administration in time range)    ED Course/ Medical Decision Making/ A&P   {    Click here for ABCD2, HEART and other calculatorsREFRESH Note before signing :1}                              Medical Decision Making  ***  {Document critical care time when appropriate:1} {Document review of labs and clinical decision tools ie heart score, Chads2Vasc2 etc:1}  {Document your independent review of radiology images, and any outside records:1} {Document your discussion with family members, caretakers, and with consultants:1} {Document social determinants of health affecting pt's care:1} {Document your decision making why or why not admission, treatments were needed:1} Final Clinical Impression(s) / ED Diagnoses Final diagnoses:  None    Rx / DC Orders ED Discharge Orders     None

## 2023-09-23 LAB — URINALYSIS, ROUTINE W REFLEX MICROSCOPIC
Bilirubin Urine: NEGATIVE
Glucose, UA: >=500 mg/dL — AB
Hgb urine dipstick: NEGATIVE
Ketones, ur: NEGATIVE mg/dL
Nitrite: NEGATIVE
Protein, ur: NEGATIVE mg/dL
Specific Gravity, Urine: 1.031 — ABNORMAL HIGH (ref 1.005–1.030)
pH: 5 (ref 5.0–8.0)

## 2023-09-23 LAB — RESP PANEL BY RT-PCR (RSV, FLU A&B, COVID)  RVPGX2
Influenza A by PCR: NEGATIVE
Influenza B by PCR: NEGATIVE
Resp Syncytial Virus by PCR: NEGATIVE
SARS Coronavirus 2 by RT PCR: NEGATIVE

## 2023-09-23 LAB — CBG MONITORING, ED: Glucose-Capillary: 329 mg/dL — ABNORMAL HIGH (ref 70–99)

## 2023-09-23 MED ORDER — FLUCONAZOLE 150 MG PO TABS
150.0000 mg | ORAL_TABLET | Freq: Once | ORAL | Status: AC
  Administered 2023-09-23: 150 mg via ORAL
  Filled 2023-09-23: qty 1

## 2023-09-23 MED ORDER — ONDANSETRON 4 MG PO TBDP: 4.0000 mg | ORAL_TABLET | Freq: Three times a day (TID) | ORAL | 0 refills | Status: DC | PRN

## 2023-09-23 NOTE — ED Notes (Signed)
Pt given PO fluids per provider order

## 2023-09-23 NOTE — Discharge Instructions (Signed)
Take zofran as needed every 8 hours for nausea. Drink lots of fluids to avoid dehydration.   Your blood sugar has been elevated here, last level at 329. Watch this closely at home and follow up with your doctor if it remains consistently elevated.

## 2023-09-26 DIAGNOSIS — F419 Anxiety disorder, unspecified: Secondary | ICD-10-CM | POA: Diagnosis not present

## 2023-09-26 DIAGNOSIS — F4322 Adjustment disorder with anxiety: Secondary | ICD-10-CM | POA: Diagnosis not present

## 2023-09-29 DIAGNOSIS — F419 Anxiety disorder, unspecified: Secondary | ICD-10-CM | POA: Diagnosis not present

## 2023-09-29 DIAGNOSIS — F4322 Adjustment disorder with anxiety: Secondary | ICD-10-CM | POA: Diagnosis not present

## 2023-10-03 ENCOUNTER — Ambulatory Visit
Admission: EM | Admit: 2023-10-03 | Discharge: 2023-10-03 | Disposition: A | Discharge status: Home / Self Care | Payer: Medicaid Other | Attending: Family Medicine | Admitting: Family Medicine

## 2023-10-03 DIAGNOSIS — B349 Viral infection, unspecified: Secondary | ICD-10-CM | POA: Diagnosis not present

## 2023-10-03 DIAGNOSIS — Z113 Encounter for screening for infections with a predominantly sexual mode of transmission: Secondary | ICD-10-CM | POA: Insufficient documentation

## 2023-10-03 DIAGNOSIS — N898 Other specified noninflammatory disorders of vagina: Secondary | ICD-10-CM | POA: Insufficient documentation

## 2023-10-03 DIAGNOSIS — R300 Dysuria: Secondary | ICD-10-CM | POA: Insufficient documentation

## 2023-10-03 LAB — POCT INFLUENZA A/B
Influenza A, POC: NEGATIVE
Influenza B, POC: NEGATIVE

## 2023-10-03 LAB — POCT URINALYSIS DIP (MANUAL ENTRY)
Bilirubin, UA: NEGATIVE
Glucose, UA: 500 mg/dL — AB
Leukocytes, UA: NEGATIVE
Nitrite, UA: NEGATIVE
Protein Ur, POC: 30 mg/dL — AB
Spec Grav, UA: 1.02 (ref 1.010–1.025)
Urobilinogen, UA: NEGATIVE U/dL — AB
pH, UA: 7 (ref 5.0–8.0)

## 2023-10-03 LAB — POCT URINE PREGNANCY: Preg Test, Ur: NEGATIVE

## 2023-10-03 MED ORDER — IBUPROFEN 800 MG PO TABS: 800.0000 mg | ORAL_TABLET | Freq: Three times a day (TID) | ORAL | 0 refills | Status: DC | PRN

## 2023-10-03 MED ORDER — FLUCONAZOLE 150 MG PO TABS
150.0000 mg | ORAL_TABLET | Freq: Every day | ORAL | 0 refills | Status: AC
Start: 2023-10-03 — End: 2023-10-06

## 2023-10-03 NOTE — ED Triage Notes (Signed)
Pt presents with c/o headaches, cough x 2 days. Ptt reports her children recently had the flu. States she has vaginal discharge X 3 days, c/o back pain.

## 2023-10-03 NOTE — Discharge Instructions (Addendum)
The clinic will contact you with results of the testing done today.  Start Diflucan as prescribed.  You may take ibuprofen every 8 hours as needed for body aches.  Lots of fluids and rest.  Please follow-up with your PCP if your symptoms do not improve.  Please go to the ER for any worsening symptoms.  Hope you feel better soon!

## 2023-10-03 NOTE — ED Provider Notes (Signed)
[facility]    CSN: [id number] Arrival date & time: 10/03/23  1249      History   Chief Complaint No chief complaint on file.   HPI Monique Schmidt is a 38 y.o. female  presents for evaluation of URI symptoms for 2 days. Patient reports associated symptoms of cough, congestion headaches, body aches. Denies N/V/D, fevers, sore throat, ear pain, shortness of breath. Patient does not have a hx of asthma. Patient is not an active smoker.   Reports 3 of her kids have the flu.  In addition she reports 3 days of a malodorous pruritic discharge.  Also has some dysuria without hematuria or flank pain.  Would like STD screening but no known exposure.  Was seen at [facility], ER on February 2 for flulike symptoms.  Had a negative respiratory panel.  She was discharged home after some fluids and also given 1 dose of Diflucan as she reported a yeast infection.  Does not appear any vaginal testing was sent at that time.  Pt has taken nothing OTC for symptoms. Pt has no other concerns at this time.   HPI  Past Medical History:  Diagnosis Date   Abnormal maternal serum screening test 02/26/2023   HR due to low FF     Abnormal Pap smear    Bacterial vaginosis    Gestational diabetes    likely pre-exisiting; all pregnancies glyburide started 11/4   Gestational thrombocytopenia (HCC)    Headache(784.0)    Hernia, umbilical    History of abnormal cervical Pap smear    normal 2014   History of gestational diabetes 06/26/2019   Mild preeclampsia delivered 01/22/2016   Moderate persistent asthma 08/07/2013   Obesity complicating pregnancy 09/21/2013   Type 2 diabetes mellitus [facility])     Patient Active Problem List   Diagnosis Date Noted   Type 2 diabetes mellitus affecting pregnancy in third trimester, antepartum 05/20/2023   Uncontrolled diabetes mellitus with hyperglycemia (HCC) 04/26/2023   Trichomonal vaginitis during pregnancy in third trimester 04/14/2023   Gestational  thrombocytopenia (HCC) 04/11/2023   Fetal growth restriction antepartum 03/01/2023   T13/T18+ on Panorama 02/26/2023   Uterine fibroid in pregnancy 12/20/2022   Chronic hypertension affecting pregnancy 12/12/2022   Type 2 diabetes mellitus affecting pregnancy, antepartum  insulin, oral 12/11/2022   Alpha thalassemia silent carrier 12/08/2022   AMA (advanced maternal age) multigravida 35+, unspecified trimester 11/19/2022   BMI 50.0-59.9, adult (HCC) 11/09/2013    Past Surgical History:  Procedure Laterality Date   UMBILICAL HERNIA REPAIR N/A 11/22/2014   Procedure: LAPAROSCOPIC UMBILICAL HERNIA;  Surgeon: Axel Filler, MD;  Location: [facility] OR;  Service: General;  Laterality: N/A;   WISDOM TOOTH EXTRACTION      OB History     Gravida  7   Para  6   Term  5   Preterm  1   AB  1   Living  6      SAB  1   IAB  0   Ectopic  0   Multiple  0   Live Births  6            Home Medications    Prior to Admission medications   Medication Sig Start Date End Date Taking? Authorizing Provider  fluconazole (DIFLUCAN) 150 MG tablet Take 1 tablet (150 mg total) by mouth daily for 3 doses. Take 1 tablet today and repeat on day 3 and 6 if symptoms persist 10/03/23 10/06/23 Yes  Radford Pax, NP  ibuprofen (ADVIL) 800 MG tablet Take 1 tablet (800 mg total) by mouth every 8 (eight) hours as needed. 10/03/23  Yes Radford Pax, NP  albuterol (VENTOLIN HFA) 108 (90 Base) MCG/ACT inhaler Inhale 1-2 puffs into the lungs every 6 (six) hours as needed for wheezing or shortness of breath. Patient not taking: Reported on 06/28/2023 06/10/23   Wallis Bamberg, PA-C  Continuous Glucose Sensor (DEXCOM G7 SENSOR) MISC Replace sensor every 10 days Patient not taking: Reported on 06/28/2023 03/06/23   Reva Bores, MD  HYDROcodone-acetaminophen (NORCO/VICODIN) 5-325 MG tablet Take 1 tablet by mouth every 4 (four) hours as needed. 07/18/23   Clark, Meghan R, PA-C  hydrocortisone cream 0.5 % Apply 1  Application topically 2 (two) times daily. Apply to outer portion of vaginal area.  Do not apply to inner parts of vaginal area. 07/24/23   Gustavus Bryant, FNP  insulin aspart (NOVOLOG FLEXPEN) 100 UNIT/ML FlexPen Inject 10 Units into the skin 3 (three) times daily with meals. 05/23/23   Bernerd Limbo, CNM  insulin glargine (LANTUS) 100 UNIT/ML Solostar Pen Inject 10 Units into the skin daily. Expires 28 days after first use 05/23/23   Edd Arbour R, CNM  Insulin Pen Needle 32G X 4 MM MISC Use 1 each as directed for injection into the skin as needed. 05/23/23   Bernerd Limbo, CNM  NIFEdipine (ADALAT CC) 60 MG 24 hr tablet Take 1 tablet (60 mg total) by mouth daily. 05/22/23   Hermina Staggers, MD  ondansetron (ZOFRAN-ODT) 4 MG disintegrating tablet Take 1 tablet (4 mg total) by mouth every 8 (eight) hours as needed for nausea or vomiting. 09/23/23   Elpidio Anis, PA-C  Prenatal Vit-Fe Phos-FA-Omega (VITAFOL GUMMIES) 3.33-0.333-34.8 MG CHEW Chew 3 tablets by mouth daily. 12/13/22   Reva Bores, MD  insulin aspart (NOVOLOG) 100 UNIT/ML injection For use in Omnipod. Current TDD is 68 units daily. Patient not taking: Reported on 04/25/2023 03/19/23   Venora Maples, MD  medroxyPROGESTERone (PROVERA) 10 MG tablet Take 1 tablet (10 mg total) by mouth daily. 06/24/19 08/18/19  Reva Bores, MD    Family History Family History  Problem Relation Age of Onset   Stroke Mother    Diabetes Mother    Hypertension Father    Diabetes Father    Seizures Brother     Social History Social History   Tobacco Use   Smoking status: Never   Smokeless tobacco: Never  Vaping Use   Vaping status: Never Used  Substance Use Topics   Alcohol use: No   Drug use: No     Allergies   Latex and Penicillins   Review of Systems Review of Systems  HENT:  Positive for congestion.   Respiratory:  Positive for cough.   Genitourinary:  Positive for dysuria and vaginal discharge.  Musculoskeletal:   Positive for myalgias.  Neurological:  Positive for headaches.     Physical Exam Triage Vital Signs ED Triage Vitals  Encounter Vitals Group     BP 10/03/23 1320 (!) 133/92     Systolic BP Percentile --      Diastolic BP Percentile --      Pulse Rate 10/03/23 1320 83     Resp 10/03/23 1320 17     Temp 10/03/23 1320 97.8 F (36.6 C)     Temp Source 10/03/23 1320 Oral     SpO2 10/03/23 1320 94 %  Weight --      Height --      Head Circumference --      Peak Flow --      Pain Score 10/03/23 1319 7     Pain Loc --      Pain Education --      Exclude from Growth Chart --    No data found.  Updated Vital Signs BP (!) 133/92 (BP Location: Right Arm)   Pulse 83   Temp 97.8 F (36.6 C) (Oral)   Resp 17   LMP 08/26/2023 (Exact Date)   SpO2 94%   Breastfeeding No   Visual Acuity Right Eye Distance:   Left Eye Distance:   Bilateral Distance:    Right Eye Near:   Left Eye Near:    Bilateral Near:     Physical Exam Vitals and nursing note reviewed.  Constitutional:      General: She is not in acute distress.    Appearance: She is well-developed. She is not ill-appearing.  HENT:     Head: Normocephalic and atraumatic.     Right Ear: Tympanic membrane and ear canal normal.     Left Ear: Tympanic membrane and ear canal normal.     Nose: Congestion present.     Mouth/Throat:     Mouth: Mucous membranes are moist.     Pharynx: Oropharynx is clear. Uvula midline. No oropharyngeal exudate or posterior oropharyngeal erythema.     Tonsils: No tonsillar exudate or tonsillar abscesses.  Eyes:     Conjunctiva/sclera: Conjunctivae normal.     Pupils: Pupils are equal, round, and reactive to light.  Cardiovascular:     Rate and Rhythm: Normal rate and regular rhythm.     Heart sounds: Normal heart sounds.  Pulmonary:     Effort: Pulmonary effort is normal.     Breath sounds: Normal breath sounds.  Abdominal:     Tenderness: There is no right CVA tenderness or left CVA  tenderness.  Musculoskeletal:     Cervical back: Normal range of motion and neck supple.  Lymphadenopathy:     Cervical: No cervical adenopathy.  Skin:    General: Skin is warm and dry.  Neurological:     General: No focal deficit present.     Mental Status: She is alert and oriented to person, place, and time.  Psychiatric:        Mood and Affect: Mood normal.        Behavior: Behavior normal.      UC Treatments / Results  Labs (all labs ordered are listed, but only abnormal results are displayed) Labs Reviewed  POCT URINALYSIS DIP (MANUAL ENTRY) - Abnormal; Notable for the following components:      Result Value   Clarity, UA cloudy (*)    Glucose, UA =500 (*)    Ketones, POC UA moderate (40) (*)    Blood, UA trace-intact (*)    Protein Ur, POC =30 (*)    Urobilinogen, UA negative (*)    All other components within normal limits  URINE CULTURE  POCT URINE PREGNANCY  POCT INFLUENZA A/B  CERVICOVAGINAL ANCILLARY ONLY    EKG   Radiology No results found.  Procedures Procedures (including critical care time)  Medications Ordered in UC Medications - No data to display  Initial Impression / Assessment and Plan / UC Course  I have reviewed the triage vital signs and the nursing notes.  Pertinent labs & imaging results that were available during  my care of the patient were reviewed by me and considered in my medical decision making (see chart for details).     Reviewed exam and symptoms with patient.  No red flags.  Urine negative for UTI, will culture given symptoms.  Vaginal swab is ordered we will contact for any positive results.  Will start Diflucan as patient reports symptoms consistent with previous yeast infections.  Negative rapid flu.  Discussed viral upper respiratory illness and symptomatic treatment.  Patient requested Rx for ibuprofen, sent to pharmacy.  Discussed fluids and rest.  PCP follow-up if symptoms do not improve.  ER precautions reviewed and  patient verbalized understanding. Final Clinical Impressions(s) / UC Diagnoses   Final diagnoses:  Vaginal discharge  Screening examination for STD (sexually transmitted disease)  Dysuria  Viral illness     Discharge Instructions      The clinic will contact you with results of the testing done today.  Start Diflucan as prescribed.  You may take ibuprofen every 8 hours as needed for body aches.  Lots of fluids and rest.  Please follow-up with your PCP if your symptoms do not improve.  Please go to the ER for any worsening symptoms.  Hope you feel better soon!     ED Prescriptions     Medication Sig Dispense Auth. Provider   fluconazole (DIFLUCAN) 150 MG tablet Take 1 tablet (150 mg total) by mouth daily for 3 doses. Take 1 tablet today and repeat on day 3 and 6 if symptoms persist 3 tablet Radford Pax, NP   ibuprofen (ADVIL) 800 MG tablet Take 1 tablet (800 mg total) by mouth every 8 (eight) hours as needed. 15 tablet Radford Pax, NP      PDMP not reviewed this encounter.   Radford Pax, NP 10/03/23 1355

## 2023-10-04 LAB — CERVICOVAGINAL ANCILLARY ONLY
Bacterial Vaginitis (gardnerella): POSITIVE — AB
Candida Glabrata: NEGATIVE
Candida Vaginitis: POSITIVE — AB
Chlamydia: NEGATIVE
Comment: NEGATIVE
Comment: NEGATIVE
Comment: NEGATIVE
Comment: NEGATIVE
Comment: NEGATIVE
Comment: NORMAL
Neisseria Gonorrhea: NEGATIVE
Trichomonas: NEGATIVE

## 2023-10-04 LAB — URINE CULTURE: Culture: <10000 — AB

## 2023-10-07 ENCOUNTER — Telehealth: Payer: Self-pay

## 2023-10-07 MED ORDER — METRONIDAZOLE 500 MG PO TABS
500.0000 mg | ORAL_TABLET | Freq: Two times a day (BID) | ORAL | 0 refills | Status: AC
Start: 2023-10-07 — End: 2023-10-14

## 2023-10-07 NOTE — Telephone Encounter (Signed)
 Per protocol, pt requires tx with metronidazole. Rx sent to pharmacy on file.

## 2023-10-23 ENCOUNTER — Ambulatory Visit
Admission: EM | Admit: 2023-10-23 | Discharge: 2023-10-23 | Disposition: A | Discharge status: Home / Self Care | Attending: Family Medicine | Admitting: Family Medicine

## 2023-10-23 DIAGNOSIS — N760 Acute vaginitis: Secondary | ICD-10-CM

## 2023-10-23 DIAGNOSIS — Z794 Long term (current) use of insulin: Secondary | ICD-10-CM | POA: Diagnosis not present

## 2023-10-23 DIAGNOSIS — E1165 Type 2 diabetes mellitus with hyperglycemia: Secondary | ICD-10-CM

## 2023-10-23 LAB — POCT URINE PREGNANCY: Preg Test, Ur: NEGATIVE

## 2023-10-23 MED ORDER — METRONIDAZOLE 500 MG PO TABS
500.0000 mg | ORAL_TABLET | Freq: Two times a day (BID) | ORAL | 0 refills | Status: DC
Start: 2023-10-23 — End: 2023-12-03

## 2023-10-23 MED ORDER — FLUCONAZOLE 150 MG PO TABS: 300.0000 mg | ORAL_TABLET | ORAL | 0 refills | Status: DC

## 2023-10-23 MED ORDER — CLOTRIMAZOLE 1 % VA CREA: 1.0000 | TOPICAL_CREAM | Freq: Every day | VAGINAL | 0 refills | Status: DC

## 2023-10-23 NOTE — ED Triage Notes (Signed)
 Pt c/o vaginal d/c and irritation x 5 days-NAD-steady gait

## 2023-10-23 NOTE — ED Provider Notes (Signed)
 [facility]  Note:  This document was prepared using Conservation officer, historic buildings and may include unintentional dictation errors.  MRN: [id number] DOB: 02-16-1986  Subjective:   Monique Schmidt is a 38 y.o. female presenting for 5 day history of acute onset vaginal discharge and irritation. Gets severe yeast infections. Has a history of BV and yeast infections. She is uncontrolled DM 2 treated with insulin. Sexually active without protection. Last LMP was January 2025.   No current facility-administered medications for this encounter.  Current Outpatient Medications:    albuterol (VENTOLIN HFA) 108 (90 Base) MCG/ACT inhaler, Inhale 1-2 puffs into the lungs every 6 (six) hours as needed for wheezing or shortness of breath. (Patient not taking: Reported on 06/28/2023), Disp: 18 g, Rfl: 1   Continuous Glucose Sensor (DEXCOM G7 SENSOR) MISC, Replace sensor every 10 days (Patient not taking: Reported on 06/28/2023), Disp: 3 each, Rfl: 5   HYDROcodone-acetaminophen (NORCO/VICODIN) 5-325 MG tablet, Take 1 tablet by mouth every 4 (four) hours as needed., Disp: 12 tablet, Rfl: 0   hydrocortisone cream 0.5 %, Apply 1 Application topically 2 (two) times daily. Apply to outer portion of vaginal area.  Do not apply to inner parts of vaginal area., Disp: 30 g, Rfl: 0   ibuprofen (ADVIL) 800 MG tablet, Take 1 tablet (800 mg total) by mouth every 8 (eight) hours as needed., Disp: 15 tablet, Rfl: 0   insulin aspart (NOVOLOG FLEXPEN) 100 UNIT/ML FlexPen, Inject 10 Units into the skin 3 (three) times daily with meals., Disp: 15 mL, Rfl: 11   insulin glargine (LANTUS) 100 UNIT/ML Solostar Pen, Inject 10 Units into the skin daily. Expires 28 days after first use, Disp: 30 mL, Rfl: 6   Insulin Pen Needle 32G X 4 MM MISC, Use 1 each as directed for injection into the skin as needed., Disp: 100 each, Rfl: 4   NIFEdipine (ADALAT CC) 60 MG 24 hr tablet, Take 1 tablet (60 mg total) by mouth  daily., Disp: 30 tablet, Rfl: 1   ondansetron (ZOFRAN-ODT) 4 MG disintegrating tablet, Take 1 tablet (4 mg total) by mouth every 8 (eight) hours as needed for nausea or vomiting., Disp: 20 tablet, Rfl: 0   Prenatal Vit-Fe Phos-FA-Omega (VITAFOL GUMMIES) 3.33-0.333-34.8 MG CHEW, Chew 3 tablets by mouth daily., Disp: 90 tablet, Rfl: 11   Allergies  Allergen Reactions   Latex Itching and Rash   Penicillins Itching and Rash    Did it involve swelling of the face/tongue/throat, SOB, or low BP? N Did it involve sudden or severe rash/hives, skin peeling, or any reaction on the inside of your mouth or nose? Y Did you need to seek medical attention at a hospital or doctor's office? No When did it last happen?  Several  Years Ago    If all above answers are "NO", may proceed with cephalosporin use.        Past Medical History:  Diagnosis Date   Abnormal maternal serum screening test 02/26/2023   HR due to low FF     Abnormal Pap smear    Bacterial vaginosis    Gestational diabetes    likely pre-exisiting; all pregnancies glyburide started 11/4   Gestational thrombocytopenia (HCC)    Headache(784.0)    Hernia, umbilical    History of abnormal cervical Pap smear    normal 2014   History of gestational diabetes 06/26/2019   Mild preeclampsia delivered 01/22/2016   Moderate persistent asthma 08/07/2013   Obesity complicating  pregnancy 09/21/2013   Type 2 diabetes mellitus (HCC)      Past Surgical History:  Procedure Laterality Date   UMBILICAL HERNIA REPAIR N/A 11/22/2014   Procedure: LAPAROSCOPIC UMBILICAL HERNIA;  Surgeon: Axel Filler, MD;  Location: [facility] OR;  Service: General;  Laterality: N/A;   WISDOM TOOTH EXTRACTION      Family History  Problem Relation Age of Onset   Stroke Mother    Diabetes Mother    Hypertension Father    Diabetes Father    Seizures Brother     Social History   Tobacco Use   Smoking status: Never   Smokeless tobacco: Never  Vaping Use    Vaping status: Never Used  Substance Use Topics   Alcohol use: No   Drug use: No    ROS   Objective:   Vitals: BP 138/84 (BP Location: Right Arm)   Pulse 78   Temp 97.8 F (36.6 C) (Oral)   Resp 18   LMP 08/22/2023   SpO2 96%   Physical Exam Constitutional:      General: She is not in acute distress.    Appearance: Normal appearance. She is well-developed. She is not ill-appearing, toxic-appearing or diaphoretic.  HENT:     Head: Normocephalic and atraumatic.     Nose: Nose normal.     Mouth/Throat:     Mouth: Mucous membranes are moist.  Eyes:     General: No scleral icterus.       Right eye: No discharge.        Left eye: No discharge.     Extraocular Movements: Extraocular movements intact.  Cardiovascular:     Rate and Rhythm: Normal rate.  Pulmonary:     Effort: Pulmonary effort is normal.  Skin:    General: Skin is warm and dry.  Neurological:     General: No focal deficit present.     Mental Status: She is alert and oriented to person, place, and time.  Psychiatric:        Mood and Affect: Mood normal.        Behavior: Behavior normal.    Results for orders placed or performed during the hospital encounter of 10/23/23 (from the past 24 hours)  POCT urine pregnancy     Status: Normal   Collection Time: 10/23/23  7:59 PM  Result Value Ref Range   Preg Test, Ur Negative     Assessment and Plan :   PDMP not reviewed this encounter.  1. Acute vaginitis   2. Type 2 diabetes mellitus with hyperglycemia, with long-term current use of insulin (HCC)    We will treat patient empirically for bacterial vaginosis with Flagyl and for yeast vaginitis with fluconazole.  Patient requested topical treatment and has typically done better with aggressive management. Labs pending. Counseled patient on potential for adverse effects with medications prescribed/recommended today, ER and return-to-clinic precautions discussed, patient verbalized understanding.    Wallis Bamberg, [place] 10/24/23 [phone]

## 2023-10-24 LAB — CERVICOVAGINAL ANCILLARY ONLY
Bacterial Vaginitis (gardnerella): NEGATIVE
Candida Glabrata: NEGATIVE
Candida Vaginitis: POSITIVE — AB
Chlamydia: NEGATIVE
Comment: NEGATIVE
Comment: NEGATIVE
Comment: NEGATIVE
Comment: NEGATIVE
Comment: NEGATIVE
Comment: NORMAL
Neisseria Gonorrhea: NEGATIVE
Trichomonas: NEGATIVE

## 2023-11-27 ENCOUNTER — Other Ambulatory Visit: Payer: Self-pay | Admitting: Family Medicine

## 2023-11-27 ENCOUNTER — Ambulatory Visit: Admitting: Family Medicine

## 2023-11-29 ENCOUNTER — Other Ambulatory Visit (INDEPENDENT_AMBULATORY_CARE_PROVIDER_SITE_OTHER): Payer: Self-pay

## 2023-11-29 ENCOUNTER — Encounter: Payer: Self-pay | Admitting: Physician Assistant

## 2023-11-29 ENCOUNTER — Ambulatory Visit (INDEPENDENT_AMBULATORY_CARE_PROVIDER_SITE_OTHER): Admitting: Physician Assistant

## 2023-11-29 DIAGNOSIS — G8929 Other chronic pain: Secondary | ICD-10-CM

## 2023-11-29 DIAGNOSIS — M25561 Pain in right knee: Secondary | ICD-10-CM

## 2023-11-29 MED ORDER — MELOXICAM 15 MG PO TABS
15.0000 mg | ORAL_TABLET | Freq: Every day | ORAL | 0 refills | Status: DC
Start: 2023-11-29 — End: 2024-04-16

## 2023-11-29 NOTE — Addendum Note (Signed)
 Addended by: Michaele Offer on: 11/29/2023 02:45 PM   Modules accepted: Orders
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
## 2023-12-03 ENCOUNTER — Ambulatory Visit
Admission: EM | Admit: 2023-12-03 | Discharge: 2023-12-03 | Disposition: A | Discharge status: Home / Self Care | Attending: Nurse Practitioner | Admitting: Nurse Practitioner

## 2023-12-03 ENCOUNTER — Encounter: Payer: Self-pay | Admitting: Emergency Medicine

## 2023-12-03 ENCOUNTER — Other Ambulatory Visit: Payer: Self-pay

## 2023-12-03 DIAGNOSIS — N898 Other specified noninflammatory disorders of vagina: Secondary | ICD-10-CM | POA: Diagnosis not present

## 2023-12-03 LAB — POCT URINE PREGNANCY: Preg Test, Ur: NEGATIVE

## 2023-12-03 MED ORDER — METRONIDAZOLE 500 MG PO TABS: 500.0000 mg | ORAL_TABLET | Freq: Two times a day (BID) | ORAL | 0 refills | Status: DC

## 2023-12-03 MED ORDER — FLUCONAZOLE 150 MG PO TABS: 150.0000 mg | ORAL_TABLET | ORAL | 0 refills | Status: AC

## 2023-12-03 NOTE — ED Provider Notes (Signed)
 [facility]    CSN: [id number] Arrival date & time: 12/03/23  1837      History   Chief Complaint No chief complaint on file.   HPI Monique Schmidt is a 38 y.o. female.    Monique Schmidt is a 38 year old female presents with a 3-day history of vaginal itching, irritation, discharge, pain, and odor. She describes the discharge as white and believes it may be thick in consistency. She denies dysuria or urinary frequency. Throughout the encounter, the patient was focused on her phone but verbally rated her pain as 10 out of 10. Her last menstrual cycle began on 11/24/2023 and was described as different from usual, lasting only 3 days and accompanied by large clots. She is sexually active with one female partner in the past 3 months, does not use condoms, and is not on birth control.  The following portions of the patient's history were reviewed and updated as appropriate: allergies, current medications, past family history, past medical history, past social history, past surgical history, and problem list.      Past Medical History:  Diagnosis Date   Abnormal maternal serum screening test 02/26/2023   HR due to low FF     Abnormal Pap smear    Bacterial vaginosis    Gestational diabetes    likely pre-exisiting; all pregnancies glyburide started 11/4   Gestational thrombocytopenia (HCC)    Headache(784.0)    Hernia, umbilical    History of abnormal cervical Pap smear    normal 2014   History of gestational diabetes 06/26/2019   Mild preeclampsia delivered 01/22/2016   Moderate persistent asthma 08/07/2013   Obesity complicating pregnancy 09/21/2013   Type 2 diabetes mellitus (HCC)     Patient Active Problem List   Diagnosis Date Noted   Pain in right knee 11/29/2023   Type 2 diabetes mellitus affecting pregnancy in third trimester, antepartum 05/20/2023   Uncontrolled diabetes mellitus with hyperglycemia (HCC) 04/26/2023   Trichomonal vaginitis during pregnancy  in third trimester 04/14/2023   Gestational thrombocytopenia (HCC) 04/11/2023   Fetal growth restriction antepartum 03/01/2023   T13/T18+ on Panorama 02/26/2023   Uterine fibroid in pregnancy 12/20/2022   Chronic hypertension affecting pregnancy 12/12/2022   Type 2 diabetes mellitus affecting pregnancy, antepartum  insulin, oral 12/11/2022   Alpha thalassemia silent carrier 12/08/2022   AMA (advanced maternal age) multigravida 35+, unspecified trimester 11/19/2022   BMI 50.0-59.9, adult (HCC) 11/09/2013    Past Surgical History:  Procedure Laterality Date   UMBILICAL HERNIA REPAIR N/A 11/22/2014   Procedure: LAPAROSCOPIC UMBILICAL HERNIA;  Surgeon: Axel Filler, MD;  Location: [facility] OR;  Service: General;  Laterality: N/A;   WISDOM TOOTH EXTRACTION      OB History     Gravida  7   Para  6   Term  5   Preterm  1   AB  1   Living  6      SAB  1   IAB  0   Ectopic  0   Multiple  0   Live Births  6            Home Medications    Prior to Admission medications   Medication Sig Start Date End Date Taking? Authorizing Provider  fluconazole (DIFLUCAN) 150 MG tablet Take 1 tablet (150 mg total) by mouth every 3 (three) days for 2 doses. 12/03/23 12/07/23 Yes Lurline Idol, FNP  metroNIDAZOLE (FLAGYL) 500 MG tablet Take 1 tablet (500 mg total)  by mouth 2 (two) times daily. 12/03/23  Yes Lurline Idol, FNP  albuterol (VENTOLIN HFA) 108 (90 Base) MCG/ACT inhaler Inhale 1-2 puffs into the lungs every 6 (six) hours as needed for wheezing or shortness of breath. Patient not taking: Reported on 06/28/2023 06/10/23   Wallis Bamberg, PA-C  clotrimazole (GYNE-LOTRIMIN) 1 % vaginal cream Place 1 Applicatorful vaginally at bedtime. 10/23/23   Wallis Bamberg, PA-C  Continuous Glucose Sensor (DEXCOM G7 SENSOR) MISC Replace sensor every 10 days Patient not taking: Reported on 06/28/2023 03/06/23   Reva Bores, MD  HYDROcodone-acetaminophen (NORCO/VICODIN) 5-325 MG tablet Take 1  tablet by mouth every 4 (four) hours as needed. 07/18/23   Clark, Meghan R, PA-C  hydrocortisone cream 0.5 % Apply 1 Application topically 2 (two) times daily. Apply to outer portion of vaginal area.  Do not apply to inner parts of vaginal area. 07/24/23   Gustavus Bryant, FNP  ibuprofen (ADVIL) 800 MG tablet Take 1 tablet (800 mg total) by mouth every 8 (eight) hours as needed. 10/03/23   Radford Pax, NP  insulin aspart (NOVOLOG FLEXPEN) 100 UNIT/ML FlexPen Inject 10 Units into the skin 3 (three) times daily with meals. 05/23/23   Bernerd Limbo, CNM  insulin glargine (LANTUS) 100 UNIT/ML Solostar Pen Inject 10 Units into the skin daily. Expires 28 days after first use 05/23/23   Edd Arbour R, CNM  Insulin Pen Needle 32G X 4 MM MISC Use 1 each as directed for injection into the skin as needed. 05/23/23   Bernerd Limbo, CNM  meloxicam (MOBIC) 15 MG tablet Take 1 tablet (15 mg total) by mouth daily. 11/29/23   Persons, [place], PA  NIFEdipine (ADALAT CC) 60 MG 24 hr tablet Take 1 tablet (60 mg total) by mouth daily. 05/22/23   Hermina Staggers, MD  ondansetron (ZOFRAN-ODT) 4 MG disintegrating tablet Take 1 tablet (4 mg total) by mouth every 8 (eight) hours as needed for nausea or vomiting. 09/23/23   Elpidio Anis, PA-C  Prenatal Vit-Fe Phos-FA-Omega (VITAFOL GUMMIES) 3.33-0.333-34.8 MG CHEW Chew 3 tablets by mouth daily. 12/13/22   Reva Bores, MD  insulin aspart (NOVOLOG) 100 UNIT/ML injection For use in Omnipod. Current TDD is 68 units daily. Patient not taking: Reported on 04/25/2023 03/19/23   Venora Maples, MD  medroxyPROGESTERone (PROVERA) 10 MG tablet Take 1 tablet (10 mg total) by mouth daily. 06/24/19 08/18/19  Reva Bores, MD    Family History Family History  Problem Relation Age of Onset   Stroke Mother    Diabetes Mother    Hypertension Father    Diabetes Father    Seizures Brother     Social History Social History   Tobacco Use   Smoking status: Never    Smokeless tobacco: Never  Vaping Use   Vaping status: Never Used  Substance Use Topics   Alcohol use: No   Drug use: No     Allergies   Latex and Penicillins   Review of Systems Review of Systems  Constitutional:  Negative for fever.  Gastrointestinal:  Negative for abdominal pain, nausea and vomiting.  Genitourinary:  Positive for vaginal discharge and vaginal pain. Negative for dysuria.  Musculoskeletal:  Negative for back pain.  All other systems reviewed and are negative.    Physical Exam Triage Vital Signs ED Triage Vitals [12/03/23 1923]  Encounter Vitals Group     BP 126/84     Systolic BP Percentile  Diastolic BP Percentile      Pulse Rate 80     Resp 15     Temp 97.8 F (36.6 C)     Temp Source Oral     SpO2 98 %     Weight      Height      Head Circumference      Peak Flow      Pain Score 10     Pain Loc      Pain Education      Exclude from Growth Chart    No data found.  Updated Vital Signs BP 126/84 (BP Location: Left Arm)   Pulse 80   Temp 97.8 F (36.6 C) (Oral)   Resp 15   SpO2 98%   Visual Acuity Right Eye Distance:   Left Eye Distance:   Bilateral Distance:    Right Eye Near:   Left Eye Near:    Bilateral Near:     Physical Exam Vitals reviewed.  Constitutional:      General: She is not in acute distress.    Appearance: Normal appearance. She is well-developed. She is morbidly obese. She is not ill-appearing, toxic-appearing or diaphoretic.     Comments: Patient demonstrated limited eye contact with the provider and remained on her phone throughout the entire encounter. She was brief and minimally communicative in her responses, offering only short answers to questions asked.  HENT:     Head: Normocephalic.     Nose: Nose normal.     Mouth/Throat:     Mouth: Mucous membranes are moist.  Eyes:     Conjunctiva/sclera: Conjunctivae normal.  Cardiovascular:     Rate and Rhythm: Normal rate.  Pulmonary:     Effort:  Pulmonary effort is normal.  Abdominal:     Palpations: Abdomen is soft.  Genitourinary:    Comments: Deferred; patient performed self-swab for Aptima testing  Musculoskeletal:        General: Normal range of motion.     Cervical back: Normal range of motion and neck supple.  Skin:    General: Skin is warm and dry.  Neurological:     General: No focal deficit present.     Mental Status: She is alert and oriented to person, place, and time.  Psychiatric:        Mood and Affect: Mood normal.        Behavior: Behavior normal.      UC Treatments / Results  Labs (all labs ordered are listed, but only abnormal results are displayed) Labs Reviewed  POCT URINE PREGNANCY - Normal  CERVICOVAGINAL ANCILLARY ONLY    EKG   Radiology No results found.  Procedures Procedures (including critical care time)  Medications Ordered in UC Medications - No data to display  Initial Impression / Assessment and Plan / UC Course  I have reviewed the triage vital signs and the nursing notes.  Pertinent labs & imaging results that were available during my care of the patient were reviewed by me and considered in my medical decision making (see chart for details).    38 year old female presenting with a 3-day history of vaginal itching, irritation, discharge, pain, and odor. She denies dysuria or urinary frequency. Urine pregnancy test is negative. GU exam was deferred, and the patient performed a self-swab for Aptima testing. Results for gonorrhea, chlamydia, trichomonas, bacterial vaginosis, and yeast are pending.  Will empirically start treating with Flagyl and Diflucan.  Patient was advised to abstain from any  sexual activity until all results are received, appropriate treatment has been completed, and symptoms have fully resolved.  Today's evaluation has revealed no signs of a dangerous process. Discussed diagnosis with patient and/or guardian. Patient and/or guardian aware of their diagnosis,  possible red flag symptoms to watch out for and need for close follow up. Patient and/or guardian understands verbal and written discharge instructions. Patient and/or guardian comfortable with plan and disposition.  Patient and/or guardian has a clear mental status at this time, good insight into illness (after discussion and teaching) and has clear judgment to make decisions regarding their care  Documentation was completed with the aid of voice recognition software. Transcription may contain typographical errors. Final Clinical Impressions(s) / UC Diagnoses   Final diagnoses:  Vaginal itching  Vaginal discharge     Discharge Instructions      Testing for bacterial infections, yeast, gonorrhea, chlamydia, and trichomonas is currently pending. You should avoid any sexual activity until you receive the results and your symptoms have resolved. In the meantime, we are starting treatment for possible bacterial and yeast infections with Flagyl and Diflucan based on your symptoms. You will only be contacted if any of the test results are positive. You may also check your results online through MyChart.     ED Prescriptions     Medication Sig Dispense Auth. Provider   metroNIDAZOLE (FLAGYL) 500 MG tablet Take 1 tablet (500 mg total) by mouth 2 (two) times daily. 14 tablet Maryruth Sol, FNP   fluconazole (DIFLUCAN) 150 MG tablet Take 1 tablet (150 mg total) by mouth every 3 (three) days for 2 doses. 2 tablet Maryruth Sol, FNP      PDMP not reviewed this encounter.   Maryruth Sol, [place] 12/03/23 2017

## 2023-12-03 NOTE — ED Triage Notes (Signed)
Pt c/o yeast infection x3 days  

## 2023-12-03 NOTE — Discharge Instructions (Addendum)
 Testing for bacterial infections, yeast, gonorrhea, chlamydia, and trichomonas is currently pending. You should avoid any sexual activity until you receive the results and your symptoms have resolved. In the meantime, we are starting treatment for possible bacterial and yeast infections with Flagyl and Diflucan based on your symptoms. You will only be contacted if any of the test results are positive. You may also check your results online through MyChart.

## 2023-12-04 LAB — CERVICOVAGINAL ANCILLARY ONLY
Bacterial Vaginitis (gardnerella): NEGATIVE
Candida Glabrata: NEGATIVE
Candida Vaginitis: POSITIVE — AB
Chlamydia: NEGATIVE
Comment: NEGATIVE
Comment: NEGATIVE
Comment: NEGATIVE
Comment: NEGATIVE
Comment: NEGATIVE
Comment: NORMAL
Neisseria Gonorrhea: NEGATIVE
Trichomonas: NEGATIVE

## 2023-12-15 ENCOUNTER — Inpatient Hospital Stay: Admission: RE | Admit: 2023-12-15 | Source: Ambulatory Visit

## 2023-12-24 ENCOUNTER — Ambulatory Visit: Admitting: Physician Assistant

## 2024-01-07 ENCOUNTER — Other Ambulatory Visit: Payer: Self-pay

## 2024-01-07 ENCOUNTER — Emergency Department (HOSPITAL_COMMUNITY)
Admission: EM | Admit: 2024-01-07 | Discharge: 2024-01-07 | Disposition: A | Discharge status: Home / Self Care | Attending: Emergency Medicine | Admitting: Emergency Medicine

## 2024-01-07 DIAGNOSIS — U071 COVID-19: Secondary | ICD-10-CM | POA: Diagnosis not present

## 2024-01-07 DIAGNOSIS — Z794 Long term (current) use of insulin: Secondary | ICD-10-CM | POA: Diagnosis not present

## 2024-01-07 DIAGNOSIS — J4540 Moderate persistent asthma, uncomplicated: Secondary | ICD-10-CM | POA: Insufficient documentation

## 2024-01-07 DIAGNOSIS — E119 Type 2 diabetes mellitus without complications: Secondary | ICD-10-CM | POA: Diagnosis not present

## 2024-01-07 DIAGNOSIS — N3001 Acute cystitis with hematuria: Secondary | ICD-10-CM | POA: Insufficient documentation

## 2024-01-07 DIAGNOSIS — R059 Cough, unspecified: Secondary | ICD-10-CM | POA: Diagnosis present

## 2024-01-07 DIAGNOSIS — Z7951 Long term (current) use of inhaled steroids: Secondary | ICD-10-CM | POA: Diagnosis not present

## 2024-01-07 DIAGNOSIS — G4489 Other headache syndrome: Secondary | ICD-10-CM | POA: Diagnosis not present

## 2024-01-07 DIAGNOSIS — B3731 Acute candidiasis of vulva and vagina: Secondary | ICD-10-CM | POA: Insufficient documentation

## 2024-01-07 LAB — URINALYSIS, ROUTINE W REFLEX MICROSCOPIC
Bilirubin Urine: NEGATIVE
Glucose, UA: >=500 mg/dL — AB
Ketones, ur: 20 mg/dL — AB
Nitrite: NEGATIVE
Protein, ur: 100 mg/dL — AB
Specific Gravity, Urine: 1.024 (ref 1.005–1.030)
WBC, UA: >50 WBC/hpf (ref 0–5)
pH: 6 (ref 5.0–8.0)

## 2024-01-07 LAB — RESP PANEL BY RT-PCR (RSV, FLU A&B, COVID)  RVPGX2
Influenza A by PCR: NEGATIVE
Influenza B by PCR: NEGATIVE
Resp Syncytial Virus by PCR: NEGATIVE
SARS Coronavirus 2 by RT PCR: POSITIVE — AB

## 2024-01-07 LAB — GC/CHLAMYDIA PROBE AMP (~~LOC~~) NOT AT ARMC
Chlamydia: NEGATIVE
Comment: NEGATIVE
Comment: NORMAL
Neisseria Gonorrhea: NEGATIVE

## 2024-01-07 LAB — WET PREP, GENITAL
Clue Cells Wet Prep HPF POC: NONE SEEN
Sperm: NONE SEEN
Trich, Wet Prep: NONE SEEN
WBC, Wet Prep HPF POC: <10 (ref <10)

## 2024-01-07 LAB — PREGNANCY, URINE: Preg Test, Ur: NEGATIVE

## 2024-01-07 LAB — GROUP A STREP BY PCR: Group A Strep by PCR: NOT DETECTED

## 2024-01-07 MED ORDER — ONDANSETRON 4 MG PO TBDP: 4.0000 mg | ORAL_TABLET | Freq: Three times a day (TID) | ORAL | 0 refills | Status: DC | PRN

## 2024-01-07 MED ORDER — ONDANSETRON 4 MG PO TBDP
4.0000 mg | ORAL_TABLET | Freq: Once | ORAL | Status: AC
  Administered 2024-01-07: 4 mg via ORAL
  Filled 2024-01-07: qty 1

## 2024-01-07 MED ORDER — SULFAMETHOXAZOLE-TRIMETHOPRIM 800-160 MG PO TABS: 1.0000 | ORAL_TABLET | Freq: Two times a day (BID) | ORAL | 0 refills | Status: AC

## 2024-01-07 MED ORDER — FLUCONAZOLE 150 MG PO TABS
150.0000 mg | ORAL_TABLET | Freq: Once | ORAL | Status: AC
Start: 2024-01-07 — End: 2024-01-07
  Administered 2024-01-07: 150 mg via ORAL
  Filled 2024-01-07: qty 1

## 2024-01-07 MED ORDER — ACETAMINOPHEN 325 MG PO TABS
650.0000 mg | ORAL_TABLET | Freq: Once | ORAL | Status: AC
  Administered 2024-01-07: 650 mg via ORAL
  Filled 2024-01-07: qty 2

## 2024-01-07 NOTE — ED Triage Notes (Signed)
 Pt BIB [facility] from home for flulike sx x2 days. Cough, sore throat, ear pain. Endorses some nausea. Kids at home are sick. Tylenol  10p last night for fever. Also concerned for yeast infection or uti, reports itching and frequency  138 palp Hr 105 94% ra

## 2024-01-07 NOTE — Discharge Instructions (Signed)
 Your workup tonight shows that you have a COVID infection.  You should take Tylenol  or ibuprofen  at home for fever and pain control.  Be sure to drink plenty of fluids.  You also had signs of a urinary tract infection.  Please take the prescribed antibiotics until they are complete.  You were treated here in the emergency department for yeast infection.  Follow-up with your primary care provider as needed.

## 2024-01-07 NOTE — ED Notes (Signed)
Sent urine culture.  

## 2024-01-07 NOTE — ED Provider Notes (Signed)
 [place] EMERGENCY DEPARTMENT AT [facility] Provider Note   CSN: [id number] Arrival date & time: 01/07/24  0400     History  Chief Complaint  Patient presents with   Cough    Monique Schmidt is a 38 y.o. female.  Patient with past medical history seen for type II DM, moderate persistent asthma presents the emergency room via EMS with multiple complaints.  Patient reports 2 days of cough, sore throat, ear discomfort.  She also endorses mild nausea with no vomiting.  The patient states that her children at home are sick with similar symptoms.  She took Tylenol  at 10 PM for a fever at home.  Patient also reports possible UTI with urinary frequency and reports vaginal itching and is concerned about possible yeast infection.  She denies abdominal pain, chest pain, shortness of breath.   Cough      Home Medications Prior to Admission medications   Medication Sig Start Date End Date Taking? Authorizing Provider  ondansetron  (ZOFRAN -ODT) 4 MG disintegrating tablet Take 1 tablet (4 mg total) by mouth every 8 (eight) hours as needed for nausea or vomiting. 01/07/24  Yes Abelardo Hoehn B, PA-C  sulfamethoxazole -trimethoprim  (BACTRIM  DS) 800-160 MG tablet Take 1 tablet by mouth 2 (two) times daily for 7 days. 01/07/24 01/14/24 Yes Elisa Guest, PA-C  albuterol  (VENTOLIN  HFA) 108 (90 Base) MCG/ACT inhaler Inhale 1-2 puffs into the lungs every 6 (six) hours as needed for wheezing or shortness of breath. Patient not taking: Reported on 06/28/2023 06/10/23   Adolph Hoop, PA-C  clotrimazole  (GYNE-LOTRIMIN ) 1 % vaginal cream Place 1 Applicatorful vaginally at bedtime. 10/23/23   Adolph Hoop, PA-C  Continuous Glucose Sensor (DEXCOM G7 SENSOR) MISC Replace sensor every 10 days Patient not taking: Reported on 06/28/2023 03/06/23   Granville Layer, MD  HYDROcodone -acetaminophen  (NORCO/VICODIN) 5-325 MG tablet Take 1 tablet by mouth every 4 (four) hours as needed. 07/18/23   Clark, Meghan R,  PA-C  hydrocortisone  cream 0.5 % Apply 1 Application topically 2 (two) times daily. Apply to outer portion of vaginal area.  Do not apply to inner parts of vaginal area. 07/24/23   Dodson Freestone, FNP  ibuprofen  (ADVIL ) 800 MG tablet Take 1 tablet (800 mg total) by mouth every 8 (eight) hours as needed. 10/03/23   Mayer, Jodi R, NP  insulin  aspart (NOVOLOG  FLEXPEN) 100 UNIT/ML FlexPen Inject 10 Units into the skin 3 (three) times daily with meals. 05/23/23   Derick Fleeting, CNM  insulin  glargine (LANTUS ) 100 UNIT/ML Solostar Pen Inject 10 Units into the skin daily. Expires 28 days after first use 05/23/23   Walker, Jamilla R, CNM  Insulin  Pen Needle 32G X 4 MM MISC Use 1 each as directed for injection into the skin as needed. 05/23/23   Derick Fleeting, CNM  meloxicam  (MOBIC ) 15 MG tablet Take 1 tablet (15 mg total) by mouth daily. 11/29/23   Persons, Norma Beckers, PA  metroNIDAZOLE  (FLAGYL ) 500 MG tablet Take 1 tablet (500 mg total) by mouth 2 (two) times daily. 12/03/23   Maryruth Sol, FNP  NIFEdipine  (ADALAT  CC) 60 MG 24 hr tablet Take 1 tablet (60 mg total) by mouth daily. 05/22/23   Ervin, Michael L, MD  Prenatal Vit-Fe Phos-FA-Omega (VITAFOL  GUMMIES) 3.33-0.333-34.8 MG CHEW Chew 3 tablets by mouth daily. 12/13/22   [place] Layer, MD  insulin  aspart (NOVOLOG ) 100 UNIT/ML injection For use in Omnipod. Current TDD is 68 units daily. Patient not taking: Reported on  04/25/2023 03/19/23   Teena Feast, MD  medroxyPROGESTERone  (PROVERA ) 10 MG tablet Take 1 tablet (10 mg total) by mouth daily. 06/24/19 08/18/19  Granville Layer, MD      Allergies    Latex and Penicillins    Review of Systems   Review of Systems  Respiratory:  Positive for cough.     Physical Exam Updated Vital Signs BP 123/79 (BP Location: Right Leg)   Pulse 99   Temp 99.9 F (37.7 C) (Oral)   Resp 18   LMP  (LMP Unknown)   SpO2 90%  Physical Exam Vitals and nursing note reviewed.  HENT:     Head: Normocephalic and  atraumatic.  Eyes:     Conjunctiva/sclera: Conjunctivae normal.  Cardiovascular:     Rate and Rhythm: Normal rate and regular rhythm.  Pulmonary:     Effort: Pulmonary effort is normal. No respiratory distress.     Breath sounds: Normal breath sounds.  Abdominal:     Palpations: Abdomen is soft.     Tenderness: There is no abdominal tenderness.  Genitourinary:    Comments: Deferred Musculoskeletal:        General: No signs of injury.     Cervical back: Normal range of motion.  Skin:    General: Skin is dry.  Neurological:     Mental Status: She is alert.  Psychiatric:        Speech: Speech normal.        Behavior: Behavior normal.     ED Results / Procedures / Treatments   Labs (all labs ordered are listed, but only abnormal results are displayed) Labs Reviewed  RESP PANEL BY RT-PCR (RSV, FLU A&B, COVID)  RVPGX2 - Abnormal; Notable for the following components:      Result Value   SARS Coronavirus 2 by RT PCR POSITIVE (*)    All other components within normal limits  WET PREP, GENITAL - Abnormal; Notable for the following components:   Yeast Wet Prep HPF POC PRESENT (*)    All other components within normal limits  URINALYSIS, ROUTINE W REFLEX MICROSCOPIC - Abnormal; Notable for the following components:   APPearance CLOUDY (*)    Glucose, UA >=500 (*)    Hgb urine dipstick MODERATE (*)    Ketones, ur 20 (*)    Protein, ur 100 (*)    Leukocytes,Ua LARGE (*)    Bacteria, UA RARE (*)    All other components within normal limits  GROUP A STREP BY PCR  PREGNANCY, URINE  GC/CHLAMYDIA PROBE AMP ([place]) NOT AT [facility]    EKG None  Radiology No results found.  Procedures Procedures    Medications Ordered in ED Medications  fluconazole  (DIFLUCAN ) tablet 150 mg (has no administration in time range)  ondansetron  (ZOFRAN -ODT) disintegrating tablet 4 mg (4 mg Oral Given 01/07/24 0448)  acetaminophen  (TYLENOL ) tablet 650 mg (650 mg Oral Given 01/07/24 0522)     ED Course/ Medical Decision Making/ A&P                                 Medical Decision Making Amount and/or Complexity of Data Reviewed Labs: ordered.  Risk OTC drugs. Prescription drug management.   This patient presents to the ED for concern of upper respiratory symptoms, urinary frequency, vaginal itching, this involves an extensive number of treatment options, and is a complaint that carries with it a high risk of complications  and morbidity.  The differential diagnosis includes COVID, influenza, other respiratory viruses.  Differential for the urinary frequency includes UTI, bladder spasms, others.  Differential for vaginal itching includes STIs, BV, Candida, others   Co morbidities that complicate the patient evaluation  History of BV   Additional history obtained:  Additional history obtained from EMS External records from outside source obtained and reviewed including urgent care notes with visits for vaginal itching   Lab Tests:  I Ordered, and personally interpreted labs.  The pertinent results include: Positive COVID test, negative pregnancy test, negative strep test, UA with large leukocytes, moderate hemoglobin, rare bacteria, greater than 50 WBC   Problem List / ED Course / Critical interventions / Medication management   I ordered medication including Zofran  for nausea, Diflucan  for Candida infection Reevaluation of the patient after these medicines showed that the patient improved I have reviewed the patients home medicines and have made adjustments as needed   Social Determinants of Health:  Patient has Medicaid for primary health transplant   Test / Admission - Considered:  Patient with COVID infection.  This is consistent with her presentation.  Patient also has evidence of UTI.  Will treat with Keflex .  Wet prep concerning for Candida.  Treated with Diflucan .  No indication for further emergent workup or admission.  Patient stable for  discharge home.         Final Clinical Impression(s) / ED Diagnoses Final diagnoses:  COVID-19  Candida vaginitis  Acute cystitis with hematuria    Rx / DC Orders ED Discharge Orders          Ordered    sulfamethoxazole -trimethoprim  (BACTRIM  DS) 800-160 MG tablet  2 times daily        01/07/24 0556    ondansetron  (ZOFRAN -ODT) 4 MG disintegrating tablet  Every 8 hours PRN        01/07/24 0556              Elisa Guest, PA-C 01/07/24 9678    Onetha Bile, MD 01/08/24 0010

## 2024-01-19 ENCOUNTER — Other Ambulatory Visit: Payer: Self-pay | Admitting: Family Medicine

## 2024-01-19 DIAGNOSIS — O0990 Supervision of high risk pregnancy, unspecified, unspecified trimester: Secondary | ICD-10-CM

## 2024-01-24 ENCOUNTER — Other Ambulatory Visit: Payer: Self-pay | Admitting: Lactation Services

## 2024-01-24 DIAGNOSIS — O0990 Supervision of high risk pregnancy, unspecified, unspecified trimester: Secondary | ICD-10-CM

## 2024-01-24 MED ORDER — VITAFOL GUMMIES 3.33-0.333-34.8 MG PO CHEW
3.0000 | CHEWABLE_TABLET | Freq: Every day | ORAL | 11 refills | Status: DC
Start: 2024-01-24 — End: 2024-04-16

## 2024-02-11 DIAGNOSIS — F419 Anxiety disorder, unspecified: Secondary | ICD-10-CM | POA: Diagnosis not present

## 2024-02-11 DIAGNOSIS — F4322 Adjustment disorder with anxiety: Secondary | ICD-10-CM | POA: Diagnosis not present

## 2024-02-13 ENCOUNTER — Other Ambulatory Visit: Payer: Self-pay

## 2024-02-13 ENCOUNTER — Encounter (HOSPITAL_COMMUNITY): Payer: Self-pay | Admitting: Emergency Medicine

## 2024-02-13 ENCOUNTER — Ambulatory Visit (HOSPITAL_COMMUNITY)
Admission: EM | Admit: 2024-02-13 | Discharge: 2024-02-13 | Disposition: A | Discharge status: Home / Self Care | Attending: Family Medicine | Admitting: Family Medicine

## 2024-02-13 DIAGNOSIS — N760 Acute vaginitis: Secondary | ICD-10-CM | POA: Insufficient documentation

## 2024-02-13 DIAGNOSIS — B372 Candidiasis of skin and nail: Secondary | ICD-10-CM | POA: Insufficient documentation

## 2024-02-13 DIAGNOSIS — N898 Other specified noninflammatory disorders of vagina: Secondary | ICD-10-CM | POA: Insufficient documentation

## 2024-02-13 LAB — POCT URINE PREGNANCY: Preg Test, Ur: NEGATIVE

## 2024-02-13 MED ORDER — FLUCONAZOLE 150 MG PO TABS: 150.0000 mg | ORAL_TABLET | ORAL | 0 refills | Status: DC

## 2024-02-13 MED ORDER — MICONAZOLE NITRATE 2 % POWD: 0 refills | Status: DC

## 2024-02-13 NOTE — Discharge Instructions (Signed)
 Apply miconazole  powder to underneath both breasts every 12 hours for the next 10 to 14 days.  Wear a supportive bra to avoid excessive moisture buildup to the inferior breast region.  I suspect that your  symptoms are due to suspected vaginal infection.  Take medication as prescribed.  Your swab has been sent for testing, staff will call you in 2-3 days if you test positive for any other infections and will provide treatment at that time.  Return to urgent care as needed and follow-up with your primary care provider for further evaluation and management of your symptoms..  I hope you feel better!

## 2024-02-13 NOTE — ED Provider Notes (Signed)
 [facility]    CSN: [id number] Arrival date & time: 02/13/24  1211      History   Chief Complaint Chief Complaint  Patient presents with   Rash    HPI Monique Schmidt is a 38 y.o. female.   Monique Schmidt is a 38 y.o. female with history of type 2 diabetes presenting for chief complaint of vaginal itching and rash to the skin underneath the breasts that started a few days ago.  History of frequent vaginal yeast infections and fungal infection underneath both breasts when it gets hot outside.  She states she has been prescribed powder for yeast infection to the skin under the breast in the past that has helped significantly.  Denies drainage from the rash underneath the breasts, fever, chills, and recent changes in body washes/detergents.  Rash is itchy.  Additionally reports vaginal itching with thin white vaginal discharge.  History of BV and yeast, states this feels very much like a vaginal yeast infection.  She would also like to be tested for STDs and pregnancy today.  Recently sexually active unprotected with female partner.  No known exposures to STDs.  Last menstrual cycle was on Dec 24, 2023 (6 weeks ago).  She has not attempted use of any over-the-counter medications to help with symptoms PTA.   Rash   Past Medical History:  Diagnosis Date   Abnormal maternal serum screening test 02/26/2023   HR due to low FF     Abnormal Pap smear    Bacterial vaginosis    Gestational diabetes    likely pre-exisiting; all pregnancies glyburide  started 11/4   Gestational thrombocytopenia (HCC)    Headache(784.0)    Hernia, umbilical    History of abnormal cervical Pap smear    normal 2014   History of gestational diabetes 06/26/2019   Mild preeclampsia delivered 01/22/2016   Moderate persistent asthma 08/07/2013   Obesity complicating pregnancy 09/21/2013   Type 2 diabetes mellitus [facility])     Patient Active Problem List   Diagnosis Date Noted   Pain in right knee  11/29/2023   Type 2 diabetes mellitus affecting pregnancy in third trimester, antepartum 05/20/2023   Uncontrolled diabetes mellitus with hyperglycemia (HCC) 04/26/2023   Trichomonal vaginitis during pregnancy in third trimester 04/14/2023   Gestational thrombocytopenia (HCC) 04/11/2023   Fetal growth restriction antepartum 03/01/2023   T13/T18+ on Panorama 02/26/2023   Uterine fibroid in pregnancy 12/20/2022   Chronic hypertension affecting pregnancy 12/12/2022   Type 2 diabetes mellitus affecting pregnancy, antepartum  insulin , oral 12/11/2022   Alpha thalassemia silent carrier 12/08/2022   AMA (advanced maternal age) multigravida 35+, unspecified trimester 11/19/2022   BMI 50.0-59.9, adult (HCC) 11/09/2013    Past Surgical History:  Procedure Laterality Date   UMBILICAL HERNIA REPAIR N/A 11/22/2014   Procedure: LAPAROSCOPIC UMBILICAL HERNIA;  Surgeon: Lynda Leos, MD;  Location: [facility] OR;  Service: General;  Laterality: N/A;   WISDOM TOOTH EXTRACTION      OB History     Gravida  7   Para  6   Term  5   Preterm  1   AB  1   Living  6      SAB  1   IAB  0   Ectopic  0   Multiple  0   Live Births  6            Home Medications    Prior to Admission medications   Medication Sig Start Date  End Date Taking? Authorizing Provider  Miconazole  Nitrate 2 % POWD Apply powder to underneath both breasts every 12 hours for the next 10 to 14 days. 02/13/24  Yes Enedelia Dorna HERO, FNP  albuterol  (VENTOLIN  HFA) 108 (90 Base) MCG/ACT inhaler Inhale 1-2 puffs into the lungs every 6 (six) hours as needed for wheezing or shortness of breath. Patient not taking: Reported on 06/28/2023 06/10/23   Christopher Savannah, PA-C  clotrimazole  (GYNE-LOTRIMIN ) 1 % vaginal cream Place 1 Applicatorful vaginally at bedtime. 10/23/23   Christopher Savannah, PA-C  Continuous Glucose Sensor (DEXCOM G7 SENSOR) MISC Replace sensor every 10 days Patient not taking: Reported on 06/28/2023 03/06/23   Fredirick Glenys RAMAN, MD  HYDROcodone -acetaminophen  (NORCO/VICODIN) 5-325 MG tablet Take 1 tablet by mouth every 4 (four) hours as needed. 07/18/23   Clark, Meghan R, PA-C  hydrocortisone  cream 0.5 % Apply 1 Application topically 2 (two) times daily. Apply to outer portion of vaginal area.  Do not apply to inner parts of vaginal area. 07/24/23   Hazen Darryle BRAVO, FNP  ibuprofen  (ADVIL ) 800 MG tablet Take 1 tablet (800 mg total) by mouth every 8 (eight) hours as needed. 10/03/23   Mayer, Jodi R, NP  insulin  aspart (NOVOLOG  FLEXPEN) 100 UNIT/ML FlexPen Inject 10 Units into the skin 3 (three) times daily with meals. 05/23/23   Vannie Cornell SAUNDERS, CNM  insulin  glargine (LANTUS ) 100 UNIT/ML Solostar Pen Inject 10 Units into the skin daily. Expires 28 days after first use 05/23/23   Walker, Jamilla R, CNM  Insulin  Pen Needle 32G X 4 MM MISC Use 1 each as directed for injection into the skin as needed. 05/23/23   Vannie Cornell SAUNDERS, CNM  meloxicam  (MOBIC ) 15 MG tablet Take 1 tablet (15 mg total) by mouth daily. 11/29/23   Persons, Ronal Dragon, PA  metroNIDAZOLE  (FLAGYL ) 500 MG tablet Take 1 tablet (500 mg total) by mouth 2 (two) times daily. 12/03/23   Iola Lukes, FNP  NIFEdipine  (ADALAT  CC) 60 MG 24 hr tablet Take 1 tablet (60 mg total) by mouth daily. 05/22/23   Ervin, Michael L, MD  ondansetron  (ZOFRAN -ODT) 4 MG disintegrating tablet Take 1 tablet (4 mg total) by mouth every 8 (eight) hours as needed for nausea or vomiting. 01/07/24   Logan Ubaldo NOVAK, PA-C  Prenatal Vit-Fe Phos-FA-Omega (VITAFOL  GUMMIES) 3.33-0.333-34.8 MG CHEW CHEW AND SWALLOW 3 GUMMIES BY MOUTH DAILY 02/07/24   Fredirick Glenys RAMAN, MD  Prenatal Vit-Fe Phos-FA-Omega (VITAFOL  GUMMIES) 3.33-0.333-34.8 MG CHEW Chew 3 tablets by mouth daily. 01/24/24   Fredirick Glenys RAMAN, MD  insulin  aspart (NOVOLOG ) 100 UNIT/ML injection For use in Omnipod. Current TDD is 68 units daily. Patient not taking: Reported on 04/25/2023 03/19/23   Lola Donnice HERO, MD  medroxyPROGESTERone  (PROVERA ) 10  MG tablet Take 1 tablet (10 mg total) by mouth daily. 06/24/19 08/18/19  Fredirick Glenys RAMAN, MD    Family History Family History  Problem Relation Age of Onset   Stroke Mother    Diabetes Mother    Hypertension Father    Diabetes Father    Seizures Brother     Social History Social History   Tobacco Use   Smoking status: Never   Smokeless tobacco: Never  Vaping Use   Vaping status: Never Used  Substance Use Topics   Alcohol  use: No   Drug use: No     Allergies   Latex and Penicillins   Review of Systems Review of Systems  Skin:  Positive for rash.  Per  HPI   Physical Exam Triage Vital Signs ED Triage Vitals  Encounter Vitals Group     BP 02/13/24 1303 105/75     Girls Systolic BP Percentile --      Girls Diastolic BP Percentile --      Boys Systolic BP Percentile --      Boys Diastolic BP Percentile --      Pulse Rate 02/13/24 1303 93     Resp 02/13/24 1303 20     Temp 02/13/24 1303 98.6 F (37 C)     Temp Source 02/13/24 1303 Oral     SpO2 02/13/24 1303 95 %     Weight --      Height --      Head Circumference --      Peak Flow --      Pain Score 02/13/24 1259 9     Pain Loc --      Pain Education --      Exclude from Growth Chart --    No data found.  Updated Vital Signs BP 105/75 (BP Location: Left Arm) Comment (BP Location): large cuff  Pulse 93   Temp 98.6 F (37 C) (Oral)   Resp 20   LMP 12/24/2023 (Approximate)   SpO2 95%   Visual Acuity Right Eye Distance:   Left Eye Distance:   Bilateral Distance:    Right Eye Near:   Left Eye Near:    Bilateral Near:     Physical Exam Vitals and nursing note reviewed.  Constitutional:      Appearance: She is not ill-appearing or toxic-appearing.  HENT:     Head: Normocephalic and atraumatic.     Right Ear: Hearing and external ear normal.     Left Ear: Hearing and external ear normal.     Nose: Nose normal.     Mouth/Throat:     Lips: Pink.   Eyes:     General: Lids are normal. Vision  grossly intact. Gaze aligned appropriately.     Extraocular Movements: Extraocular movements intact.     Conjunctiva/sclera: Conjunctivae normal.   Pulmonary:     Effort: Pulmonary effort is normal.  Genitourinary:    Comments: Deferred.  Musculoskeletal:     Cervical back: Neck supple.   Skin:    General: Skin is warm and dry.     Capillary Refill: Capillary refill takes less than 2 seconds.     Findings: Rash (Raised and minimally erythematous maculopapular rash with well-demarcated borders to the inframammary regions bilaterally.  No drainage from the rash.  No signs of skin excoriation.  Nontender.) present.   Neurological:     General: No focal deficit present.     Mental Status: She is alert and oriented to person, place, and time. Mental status is at baseline.     Cranial Nerves: No dysarthria or facial asymmetry.   Psychiatric:        Mood and Affect: Mood normal.        Speech: Speech normal.        Behavior: Behavior normal.        Thought Content: Thought content normal.        Judgment: Judgment normal.      UC Treatments / Results  Labs (all labs ordered are listed, but only abnormal results are displayed) Labs Reviewed - No data to display  EKG   Radiology No results found.  Procedures Procedures (including critical care time)  Medications Ordered in UC Medications -  No data to display  Initial Impression / Assessment and Plan / UC Course  I have reviewed the triage vital signs and the nursing notes.  Pertinent labs & imaging results that were available during my care of the patient were reviewed by me and considered in my medical decision making (see chart for details).   1. Acute vaginitis, vaginal itching, skin yeast infection High clinical suspicion for acute yeast vaginitis, therefore will treat with diflucan  empirically.  Vaginal swab pending, will treat for other positive infections per protocol when labs result. Discussed tips to prevent  recurrent yeast vaginitis infections. Safe sex precautions discussed.   Urine pregnancy test is negative.  2.  Skin yeast infection Inframammary skin yeast infection present on exam. Miconazole  powder twice daily for 10 to 14 days recommended. Advised to wear supportive bra and avoid excessive moisture buildup to the area. Follow-up with PCP.   Counseled patient on potential for adverse effects with medications prescribed/recommended today, strict ER and return-to-clinic precautions discussed, patient verbalized understanding.    Final Clinical Impressions(s) / UC Diagnoses   Final diagnoses:  Acute vaginitis  Vaginal itching  Skin yeast infection   Discharge Instructions   None    ED Prescriptions     Medication Sig Dispense Auth. Provider   Miconazole  Nitrate 2 % POWD Apply powder to underneath both breasts every 12 hours for the next 10 to 14 days. 100 g Enedelia Dorna HERO, FNP      PDMP not reviewed this encounter.   Enedelia Dorna HERO, [place] 02/13/24 1402

## 2024-02-13 NOTE — ED Triage Notes (Signed)
 Patient has vaginal yeast concerns and reports yeast like rash under breasts.

## 2024-02-14 LAB — CERVICOVAGINAL ANCILLARY ONLY
Bacterial Vaginitis (gardnerella): NEGATIVE
Candida Glabrata: NEGATIVE
Candida Vaginitis: POSITIVE — AB
Chlamydia: NEGATIVE
Comment: NEGATIVE
Comment: NEGATIVE
Comment: NEGATIVE
Comment: NEGATIVE
Comment: NEGATIVE
Comment: NORMAL
Neisseria Gonorrhea: NEGATIVE
Trichomonas: NEGATIVE

## 2024-02-17 ENCOUNTER — Ambulatory Visit (HOSPITAL_COMMUNITY): Payer: Self-pay

## 2024-03-04 ENCOUNTER — Encounter (HOSPITAL_COMMUNITY): Payer: Self-pay

## 2024-03-04 ENCOUNTER — Ambulatory Visit (HOSPITAL_COMMUNITY)
Admission: EM | Admit: 2024-03-04 | Discharge: 2024-03-04 | Disposition: A | Discharge status: Home / Self Care | Attending: Physician Assistant | Admitting: Physician Assistant

## 2024-03-04 DIAGNOSIS — G44209 Tension-type headache, unspecified, not intractable: Secondary | ICD-10-CM | POA: Insufficient documentation

## 2024-03-04 DIAGNOSIS — N760 Acute vaginitis: Secondary | ICD-10-CM | POA: Insufficient documentation

## 2024-03-04 DIAGNOSIS — B372 Candidiasis of skin and nail: Secondary | ICD-10-CM | POA: Insufficient documentation

## 2024-03-04 LAB — POCT URINE PREGNANCY: Preg Test, Ur: NEGATIVE

## 2024-03-04 MED ORDER — FLUCONAZOLE 150 MG PO TABS: 150.0000 mg | ORAL_TABLET | ORAL | 0 refills | Status: DC

## 2024-03-04 MED ORDER — CLOTRIMAZOLE 1 % VA CREA: 1.0000 | TOPICAL_CREAM | Freq: Every day | VAGINAL | 0 refills | Status: DC

## 2024-03-04 MED ORDER — NIFEDIPINE ER 60 MG PO TB24: 60.0000 mg | ORAL_TABLET | Freq: Every day | ORAL | 0 refills | Status: DC

## 2024-03-04 MED ORDER — NYSTATIN 100000 UNIT/GM EX POWD
1.0000 | Freq: Two times a day (BID) | CUTANEOUS | 0 refills | Status: DC
Start: 2024-03-04 — End: 2024-04-16

## 2024-03-04 MED ORDER — IBUPROFEN 600 MG PO TABS: 600.0000 mg | ORAL_TABLET | Freq: Three times a day (TID) | ORAL | 0 refills | Status: DC | PRN

## 2024-03-04 NOTE — Discharge Instructions (Signed)
 Your pregnancy test was negative in clinic.  I have called in a yeast infection cream as well as the Diflucan .  As we discussed, I think this is because your blood sugar is elevated.  Make sure you are drinking plenty fluid and monitor your blood sugars so that you are primary care can adjust the medication.  If we need to make any additional treatment plans based on your swab we will contact you.  Keep the area under your breast clean and dry.  Apply nystatin  up to twice a day.  Your blood pressure was normal after we rechecked it.  I do not think you need to start your blood pressure medications do not pick up the nifedipine .  I have called in ibuprofen  to help with your pain.  Do not take additional NSAIDs with this medication including aspirin , ibuprofen /Advil , naproxen /Aleve .

## 2024-03-04 NOTE — ED Triage Notes (Signed)
 Patient presents with vaginal itching and discharge.

## 2024-03-04 NOTE — ED Provider Notes (Signed)
 [facility]    CSN: [id number] Arrival date & time: 03/04/24  1511      History   Chief Complaint Chief Complaint  Patient presents with   Vaginal Discharge   Vaginal Itching    HPI Monique Schmidt is a 38 y.o. female.   Patient presents today with several concerns.  Her primary concern today is a 2-day history of recurrent vaginal irritation.  She was seen by clinic on 02/17/2024 at which point she had a recurrent vaginal yeast infection that resolved with Diflucan  until recurrence recently.  She does have a history of diabetes and reports that she has not been monitoring her blood sugars since this has been elevated.  She has been taking her medication.  She denies any changes to personal hygiene products including soaps or detergents.  She has not tried any over-the-counter medications.  She is interested in STI screening as well.  In addition, she was seen by our clinic on 02/17/2024 at which point she was prescribed nystatin  powder to treat skin yeast infection.  She reports that this has been improving but continues to be bothersome and so is requesting this medication be resent to the pharmacy she was never able to pick it up initially.  She reports that the rash is irritating and itchy.  Denies spread of rash since she was last evaluated or additional lesions.  In addition, she reports intermittent headaches.  These are her typical headaches and she is requesting a refill of the ibuprofen  that she has used in the past to manage these.  Denies any nausea, vomiting, visual disturbance, focal weakness, dysarthria.  Denies any recent head trauma.  She is not currently experiencing headache but would like a refill of this medication to have on hand.  She  reports her blood pressure has been elevated and wants to restart her medication.  She has previously been prescribed nifedipine  but has not taken this in several months as her blood pressure had been better.     Past  Medical History:  Diagnosis Date   Abnormal maternal serum screening test 02/26/2023   HR due to low FF     Abnormal Pap smear    Bacterial vaginosis    Gestational diabetes    likely pre-exisiting; all pregnancies glyburide  started 11/4   Gestational thrombocytopenia (HCC)    Headache(784.0)    Hernia, umbilical    History of abnormal cervical Pap smear    normal 2014   History of gestational diabetes 06/26/2019   Mild preeclampsia delivered 01/22/2016   Moderate persistent asthma 08/07/2013   Obesity complicating pregnancy 09/21/2013   Type 2 diabetes mellitus [facility])     Patient Active Problem List   Diagnosis Date Noted   Pain in right knee 11/29/2023   Type 2 diabetes mellitus affecting pregnancy in third trimester, antepartum 05/20/2023   Uncontrolled diabetes mellitus with hyperglycemia (HCC) 04/26/2023   Trichomonal vaginitis during pregnancy in third trimester 04/14/2023   Gestational thrombocytopenia (HCC) 04/11/2023   Fetal growth restriction antepartum 03/01/2023   T13/T18+ on Panorama 02/26/2023   Uterine fibroid in pregnancy 12/20/2022   Chronic hypertension affecting pregnancy 12/12/2022   Type 2 diabetes mellitus affecting pregnancy, antepartum  insulin , oral 12/11/2022   Alpha thalassemia silent carrier 12/08/2022   AMA (advanced maternal age) multigravida 35+, unspecified trimester 11/19/2022   BMI 50.0-59.9, adult (HCC) 11/09/2013    Past Surgical History:  Procedure Laterality Date   UMBILICAL HERNIA REPAIR N/A 11/22/2014   Procedure: LAPAROSCOPIC  UMBILICAL HERNIA;  Surgeon: Lynda Leos, MD;  Location: [facility] OR;  Service: General;  Laterality: N/A;   WISDOM TOOTH EXTRACTION      OB History     Gravida  7   Para  6   Term  5   Preterm  1   AB  1   Living  6      SAB  1   IAB  0   Ectopic  0   Multiple  0   Live Births  6            Home Medications    Prior to Admission medications   Medication Sig Start Date End Date  Taking? Authorizing Provider  ibuprofen  (ADVIL ) 600 MG tablet Take 1 tablet (600 mg total) by mouth every 8 (eight) hours as needed. 03/04/24  Yes Winnifred Dufford K, PA-C  nystatin  (MYCOSTATIN /NYSTOP ) powder Apply 1 Application topically 2 (two) times daily. 03/04/24  Yes Itxel Wickard K, PA-C  albuterol  (VENTOLIN  HFA) 108 (90 Base) MCG/ACT inhaler Inhale 1-2 puffs into the lungs every 6 (six) hours as needed for wheezing or shortness of breath. Patient not taking: Reported on 06/28/2023 06/10/23   Christopher Savannah, PA-C  clotrimazole  (GYNE-LOTRIMIN ) 1 % vaginal cream Place 1 Applicatorful vaginally at bedtime. 03/04/24   Kendal Ghazarian K, PA-C  Continuous Glucose Sensor (DEXCOM G7 SENSOR) MISC Replace sensor every 10 days Patient not taking: Reported on 06/28/2023 03/06/23   Fredirick Glenys RAMAN, MD  fluconazole  (DIFLUCAN ) 150 MG tablet Take 1 tablet (150 mg total) by mouth every 3 (three) days. 03/04/24   Keygan Dumond K, PA-C  hydrocortisone  cream 0.5 % Apply 1 Application topically 2 (two) times daily. Apply to outer portion of vaginal area.  Do not apply to inner parts of vaginal area. 07/24/23   Hazen Darryle BRAVO, FNP  insulin  aspart (NOVOLOG  FLEXPEN) 100 UNIT/ML FlexPen Inject 10 Units into the skin 3 (three) times daily with meals. 05/23/23   Vannie Cornell SAUNDERS, CNM  insulin  glargine (LANTUS ) 100 UNIT/ML Solostar Pen Inject 10 Units into the skin daily. Expires 28 days after first use 05/23/23   Walker, Jamilla R, CNM  Insulin  Pen Needle 32G X 4 MM MISC Use 1 each as directed for injection into the skin as needed. 05/23/23   Vannie Cornell SAUNDERS, CNM  meloxicam  (MOBIC ) 15 MG tablet Take 1 tablet (15 mg total) by mouth daily. 11/29/23   Persons, Ronal Dragon, PA  metroNIDAZOLE  (FLAGYL ) 500 MG tablet Take 1 tablet (500 mg total) by mouth 2 (two) times daily. 12/03/23   Iola Lukes, FNP  Miconazole  Nitrate 2 % POWD Apply powder to underneath both breasts every 12 hours for the next 10 to 14 days. 02/13/24   Enedelia Dorna HERO,  FNP  Prenatal Vit-Fe Phos-FA-Omega (VITAFOL  GUMMIES) 3.33-0.333-34.8 MG CHEW CHEW AND SWALLOW 3 GUMMIES BY MOUTH DAILY 02/07/24   Fredirick Glenys RAMAN, MD  Prenatal Vit-Fe Phos-FA-Omega (VITAFOL  GUMMIES) 3.33-0.333-34.8 MG CHEW Chew 3 tablets by mouth daily. 01/24/24   Fredirick Glenys RAMAN, MD  insulin  aspart (NOVOLOG ) 100 UNIT/ML injection For use in Omnipod. Current TDD is 68 units daily. Patient not taking: Reported on 04/25/2023 03/19/23   Lola Donnice HERO, MD  medroxyPROGESTERone  (PROVERA ) 10 MG tablet Take 1 tablet (10 mg total) by mouth daily. 06/24/19 08/18/19  Fredirick Glenys RAMAN, MD    Family History Family History  Problem Relation Age of Onset   Stroke Mother    Diabetes Mother    Hypertension Father  Diabetes Father    Seizures Brother     Social History Social History   Tobacco Use   Smoking status: Never   Smokeless tobacco: Never  Vaping Use   Vaping status: Never Used  Substance Use Topics   Alcohol  use: No   Drug use: No     Allergies   Latex and Penicillins   Review of Systems Review of Systems  Constitutional:  Positive for activity change. Negative for appetite change, fatigue and fever.  Eyes:  Negative for visual disturbance.  Respiratory:  Negative for shortness of breath.   Cardiovascular:  Negative for chest pain.  Gastrointestinal:  Negative for abdominal pain, diarrhea, nausea and vomiting.  Genitourinary:  Positive for vaginal discharge and vaginal pain. Negative for dysuria, frequency, urgency and vaginal bleeding.  Skin:  Positive for rash.  Neurological:  Positive for headaches. Negative for dizziness and light-headedness.     Physical Exam Triage Vital Signs ED Triage Vitals [03/04/24 1540]  Encounter Vitals Group     BP (!) 119/99     Girls Systolic BP Percentile      Girls Diastolic BP Percentile      Boys Systolic BP Percentile      Boys Diastolic BP Percentile      Pulse Rate (!) 103     Resp 20     Temp 97.7 F (36.5 C)     Temp Source  Oral     SpO2 100 %     Weight      Height      Head Circumference      Peak Flow      Pain Score      Pain Loc      Pain Education      Exclude from Growth Chart    No data found.  Updated Vital Signs BP 104/75 (BP Location: Right Arm)   Pulse 95   Temp 98.2 F (36.8 C) (Oral)   Resp 20   LMP 01/27/2024 (Approximate)   SpO2 97%   Visual Acuity Right Eye Distance:   Left Eye Distance:   Bilateral Distance:    Right Eye Near:   Left Eye Near:    Bilateral Near:     Physical Exam Vitals reviewed.  Constitutional:      General: She is awake. She is not in acute distress.    Appearance: Normal appearance. She is well-developed. She is not ill-appearing.     Comments: Very pleasant female appears stated age in no acute distress sitting comfortably in exam room  HENT:     Head: Normocephalic and atraumatic.  Cardiovascular:     Rate and Rhythm: Normal rate and regular rhythm.     Heart sounds: Normal heart sounds, S1 normal and S2 normal. No murmur heard. Pulmonary:     Effort: Pulmonary effort is normal.     Breath sounds: Normal breath sounds. No wheezing, rhonchi or rales.     Comments: Clear to auscultation bilaterally Abdominal:     Palpations: Abdomen is soft.     Tenderness: There is no abdominal tenderness. There is no right CVA tenderness, left CVA tenderness, guarding or rebound.  Skin:    Findings: Rash present. Rash is macular and papular.     Comments: Erythematous maculopapular rash with scattered satellite lesions noted inframammary region  Psychiatric:        Behavior: Behavior is cooperative.      UC Treatments / Results  Labs (all labs ordered are listed,  but only abnormal results are displayed) Labs Reviewed  POCT URINE PREGNANCY  CERVICOVAGINAL ANCILLARY ONLY    EKG   Radiology No results found.  Procedures Procedures (including critical care time)  Medications Ordered in UC Medications - No data to display  Initial  Impression / Assessment and Plan / UC Course  I have reviewed the triage vital signs and the nursing notes.  Pertinent labs & imaging results that were available during my care of the patient were reviewed by me and considered in my medical decision making (see chart for details).     Patient is well-appearing, afebrile, nontoxic.  She was initially mildly hypertensive and tachycardic but this improved after sitting quietly.  She initially was interested in restarting her medication but we discussed that given her repeat blood pressure and her normal readings at most recent visits I do not think this is necessary.  This was initially called in but then we called and try to cancel this with the pharmacy and left a voicemail for them not to fill the nifedipine .  She was also instructed on her paperwork not to start this medication but just to monitor her blood pressure at home.  I suspect her headaches are related to tension headaches as she has had these in the past.  She was given prescription ibuprofen  per her request to help manage the symptoms.  Discussed that she is not to take additional NSAIDs with this medication but can use Tylenol  for breakthrough pain.  Recommended that she rest and push fluids.  If she has any severe headache, worsening of her life, visual disturbance she is to be seen immediately.  Recommend close follow-up with her primary care.  We discussed that her skin yeast infection and acute vaginitis is likely related to hyperglycemia.  Recommend she monitor her blood sugar and follow-up with her primary care to determine if she needs to adjust her diabetes medication.  Will treat for yeast vaginitis with Diflucan  as well as topical clotrimazole .  She was given nystatin  for skin yeast infection under breasts with instruction to keep the area clean and dry as much as possible.  STI swab was collected and is pending.  We will contact her if this requires any additional treatment.  We  discussed that if anything worsens or changes she should return for reevaluation.  Strict return precautions given.  All questions were answered to patient satisfaction.  Final Clinical Impressions(s) / UC Diagnoses   Final diagnoses:  Acute vaginitis  Tension headache  Skin yeast infection     Discharge Instructions      Your pregnancy test was negative in clinic.  I have called in a yeast infection cream as well as the Diflucan .  As we discussed, I think this is because your blood sugar is elevated.  Make sure you are drinking plenty fluid and monitor your blood sugars so that you are primary care can adjust the medication.  If we need to make any additional treatment plans based on your swab we will contact you.  Keep the area under your breast clean and dry.  Apply nystatin  up to twice a day.  Your blood pressure was normal after we rechecked it.  I do not think you need to start your blood pressure medications do not pick up the nifedipine .  I have called in ibuprofen  to help with your pain.  Do not take additional NSAIDs with this medication including aspirin , ibuprofen /Advil , naproxen /Aleve .     ED Prescriptions  Medication Sig Dispense Auth. Provider   fluconazole  (DIFLUCAN ) 150 MG tablet Take 1 tablet (150 mg total) by mouth every 3 (three) days. 3 tablet Natalie Mceuen K, PA-C   NIFEdipine  (ADALAT  CC) 60 MG 24 hr tablet  (Status: Discontinued) Take 1 tablet (60 mg total) by mouth daily. 30 tablet Jeilyn Reznik K, PA-C   clotrimazole  (GYNE-LOTRIMIN ) 1 % vaginal cream Place 1 Applicatorful vaginally at bedtime. 45 g Jed Kutch K, PA-C   nystatin  (MYCOSTATIN /NYSTOP ) powder Apply 1 Application topically 2 (two) times daily. 60 g Paullette Mckain K, PA-C   ibuprofen  (ADVIL ) 600 MG tablet Take 1 tablet (600 mg total) by mouth every 8 (eight) hours as needed. 30 tablet Alekai Pocock K, PA-C      PDMP not reviewed this encounter.   Sherrell Rocky POUR, PA-C 03/04/24 1733

## 2024-03-05 LAB — CERVICOVAGINAL ANCILLARY ONLY
Bacterial Vaginitis (gardnerella): NEGATIVE
Candida Glabrata: NEGATIVE
Candida Vaginitis: POSITIVE — AB
Chlamydia: NEGATIVE
Comment: NEGATIVE
Comment: NEGATIVE
Comment: NEGATIVE
Comment: NEGATIVE
Comment: NEGATIVE
Comment: NORMAL
Neisseria Gonorrhea: NEGATIVE
Trichomonas: NEGATIVE

## 2024-03-06 ENCOUNTER — Ambulatory Visit (HOSPITAL_COMMUNITY): Payer: Self-pay

## 2024-03-13 DIAGNOSIS — F4322 Adjustment disorder with anxiety: Secondary | ICD-10-CM | POA: Diagnosis not present

## 2024-03-17 DIAGNOSIS — F419 Anxiety disorder, unspecified: Secondary | ICD-10-CM | POA: Diagnosis not present

## 2024-03-17 DIAGNOSIS — F4322 Adjustment disorder with anxiety: Secondary | ICD-10-CM | POA: Diagnosis not present

## 2024-03-19 DIAGNOSIS — F419 Anxiety disorder, unspecified: Secondary | ICD-10-CM | POA: Diagnosis not present

## 2024-03-19 DIAGNOSIS — F4322 Adjustment disorder with anxiety: Secondary | ICD-10-CM | POA: Diagnosis not present

## 2024-03-24 DIAGNOSIS — F4322 Adjustment disorder with anxiety: Secondary | ICD-10-CM | POA: Diagnosis not present

## 2024-03-24 DIAGNOSIS — F419 Anxiety disorder, unspecified: Secondary | ICD-10-CM | POA: Diagnosis not present

## 2024-03-31 DIAGNOSIS — F4322 Adjustment disorder with anxiety: Secondary | ICD-10-CM | POA: Diagnosis not present

## 2024-03-31 DIAGNOSIS — F419 Anxiety disorder, unspecified: Secondary | ICD-10-CM | POA: Diagnosis not present

## 2024-04-07 DIAGNOSIS — F4322 Adjustment disorder with anxiety: Secondary | ICD-10-CM | POA: Diagnosis not present

## 2024-04-07 DIAGNOSIS — F419 Anxiety disorder, unspecified: Secondary | ICD-10-CM | POA: Diagnosis not present

## 2024-04-09 DIAGNOSIS — F419 Anxiety disorder, unspecified: Secondary | ICD-10-CM | POA: Diagnosis not present

## 2024-04-09 DIAGNOSIS — F4322 Adjustment disorder with anxiety: Secondary | ICD-10-CM | POA: Diagnosis not present

## 2024-04-14 DIAGNOSIS — F419 Anxiety disorder, unspecified: Secondary | ICD-10-CM | POA: Diagnosis not present

## 2024-04-14 DIAGNOSIS — F4322 Adjustment disorder with anxiety: Secondary | ICD-10-CM | POA: Diagnosis not present

## 2024-04-16 ENCOUNTER — Encounter (HOSPITAL_COMMUNITY): Payer: Self-pay

## 2024-04-16 ENCOUNTER — Ambulatory Visit (HOSPITAL_COMMUNITY)
Admission: EM | Admit: 2024-04-16 | Discharge: 2024-04-16 | Disposition: A | Discharge status: Home / Self Care | Attending: Nurse Practitioner | Admitting: Nurse Practitioner

## 2024-04-16 DIAGNOSIS — N760 Acute vaginitis: Secondary | ICD-10-CM | POA: Diagnosis not present

## 2024-04-16 DIAGNOSIS — E1165 Type 2 diabetes mellitus with hyperglycemia: Secondary | ICD-10-CM | POA: Insufficient documentation

## 2024-04-16 DIAGNOSIS — N3001 Acute cystitis with hematuria: Secondary | ICD-10-CM | POA: Insufficient documentation

## 2024-04-16 DIAGNOSIS — R81 Glycosuria: Secondary | ICD-10-CM | POA: Diagnosis not present

## 2024-04-16 DIAGNOSIS — Z3202 Encounter for pregnancy test, result negative: Secondary | ICD-10-CM

## 2024-04-16 DIAGNOSIS — R21 Rash and other nonspecific skin eruption: Secondary | ICD-10-CM | POA: Diagnosis not present

## 2024-04-16 DIAGNOSIS — R300 Dysuria: Secondary | ICD-10-CM | POA: Insufficient documentation

## 2024-04-16 LAB — COMPREHENSIVE METABOLIC PANEL WITH GFR
ALT: 12 U/L (ref 0–44)
AST: 18 U/L (ref 15–41)
Albumin: 3.3 g/dL — ABNORMAL LOW (ref 3.5–5.0)
Alkaline Phosphatase: 65 U/L (ref 38–126)
Anion gap: 13 (ref 5–15)
BUN: 6 mg/dL (ref 6–20)
CO2: 24 mmol/L (ref 22–32)
Calcium: 9.1 mg/dL (ref 8.9–10.3)
Chloride: 97 mmol/L — ABNORMAL LOW (ref 98–111)
Creatinine, Ser: 0.47 mg/dL (ref 0.44–1.00)
GFR, Estimated: >60 mL/min (ref >60)
Glucose, Bld: 337 mg/dL — ABNORMAL HIGH (ref 70–99)
Potassium: 3.8 mmol/L (ref 3.5–5.1)
Sodium: 134 mmol/L — ABNORMAL LOW (ref 135–145)
Total Bilirubin: 0.3 mg/dL (ref 0.0–1.2)
Total Protein: 6.8 g/dL (ref 6.5–8.1)

## 2024-04-16 LAB — POCT URINALYSIS DIP (MANUAL ENTRY)
Bilirubin, UA: NEGATIVE
Glucose, UA: >=1000 mg/dL — AB
Ketones, POC UA: NEGATIVE mg/dL
Nitrite, UA: NEGATIVE
Protein Ur, POC: NEGATIVE mg/dL
Spec Grav, UA: 1.01 (ref 1.010–1.025)
Urobilinogen, UA: 0.2 U/dL
pH, UA: 6 (ref 5.0–8.0)

## 2024-04-16 LAB — POCT URINE PREGNANCY: Preg Test, Ur: NEGATIVE

## 2024-04-16 LAB — POCT FASTING CBG KUC MANUAL ENTRY: POCT Glucose (KUC): 337 mg/dL — AB (ref 70–99)

## 2024-04-16 MED ORDER — TRIAMCINOLONE ACETONIDE 0.1 % EX CREA
1.0000 | TOPICAL_CREAM | Freq: Two times a day (BID) | CUTANEOUS | 0 refills | Status: AC
Start: 2024-04-16 — End: ?

## 2024-04-16 MED ORDER — METRONIDAZOLE 500 MG PO TABS: 500.0000 mg | ORAL_TABLET | Freq: Two times a day (BID) | ORAL | 0 refills | Status: DC

## 2024-04-16 MED ORDER — NITROFURANTOIN MONOHYD MACRO 100 MG PO CAPS: 100.0000 mg | ORAL_CAPSULE | Freq: Two times a day (BID) | ORAL | 0 refills | Status: DC

## 2024-04-16 MED ORDER — FLUCONAZOLE 150 MG PO TABS: 150.0000 mg | ORAL_TABLET | ORAL | 0 refills | Status: AC

## 2024-04-16 NOTE — ED Triage Notes (Signed)
 Patient presenting with rash in the face, neck and groin 2 weeks ago. Also having vaginal itching and white discharge onset 2 days ago.  Prescriptions or OTC medications tried: Yes- ibuprofen     with no relief

## 2024-04-16 NOTE — ED Provider Notes (Signed)
 [facility]    CSN: [id number] Arrival date & time: 04/16/24  1520      History   Chief Complaint Chief Complaint  Patient presents with   Rash   Vaginal Discharge    HPI Monique Schmidt is a 38 y.o. female.   Discussed the use of AI scribe software for clinical note transcription with the patient, who gave verbal consent to proceed.   Patient presents with urinary burning, vaginal itching, and white vaginal discharge. She also reports nausea, vomiting, and lower back pain. The patient states she is experiencing burning when urinating and vaginal itching. She reports a white vaginal discharge. She denies any odor associated with the discharge. She reports increased urinary frequency but denies blood in her urine. She denies any fevers. The patient mentions she has not had a period this month and is sexually active with a female partner.   The patient reports a history of diabetes and states she is not currently taking any medication for it. She mentions she was previously on insulin  during a recent pregnancy and had taken metformin  about a year ago for approximately 7 months before discovering she was pregnant. She reports that the metformin  caused stomach discomfort. The patient states she has six children.  Additionally, the patient mentions a skin issue on her face. She denies using any new soaps, lotions, or detergents.  She has not tried anything for this rash.  The following portions of the patient's history were reviewed and updated as appropriate: allergies, current medications, past family history, past medical history, past social history, past surgical history, and problem list.        Past Medical History:  Diagnosis Date   Abnormal maternal serum screening test 02/26/2023   HR due to low FF     Abnormal Pap smear    Bacterial vaginosis    Gestational diabetes    likely pre-exisiting; all pregnancies glyburide  started 11/4   Gestational  thrombocytopenia (HCC)    Headache(784.0)    Hernia, umbilical    History of abnormal cervical Pap smear    normal 2014   History of gestational diabetes 06/26/2019   Mild preeclampsia delivered 01/22/2016   Moderate persistent asthma 08/07/2013   Obesity complicating pregnancy 09/21/2013   Type 2 diabetes mellitus (HCC)     Patient Active Problem List   Diagnosis Date Noted   Pain in right knee 11/29/2023   Type 2 diabetes mellitus affecting pregnancy in third trimester, antepartum 05/20/2023   Uncontrolled diabetes mellitus with hyperglycemia (HCC) 04/26/2023   Trichomonal vaginitis during pregnancy in third trimester 04/14/2023   Gestational thrombocytopenia (HCC) 04/11/2023   Fetal growth restriction antepartum 03/01/2023   T13/T18+ on Panorama 02/26/2023   Uterine fibroid in pregnancy 12/20/2022   Chronic hypertension affecting pregnancy 12/12/2022   Type 2 diabetes mellitus affecting pregnancy, antepartum  insulin , oral 12/11/2022   Alpha thalassemia silent carrier 12/08/2022   AMA (advanced maternal age) multigravida 35+, unspecified trimester 11/19/2022   BMI 50.0-59.9, adult (HCC) 11/09/2013    Past Surgical History:  Procedure Laterality Date   UMBILICAL HERNIA REPAIR N/A 11/22/2014   Procedure: LAPAROSCOPIC UMBILICAL HERNIA;  Surgeon: Lynda Leos, MD;  Location: [facility] OR;  Service: General;  Laterality: N/A;   WISDOM TOOTH EXTRACTION      OB History     Gravida  7   Para  6   Term  5   Preterm  1   AB  1   Living  6  SAB  1   IAB  0   Ectopic  0   Multiple  0   Live Births  6            Home Medications    Prior to Admission medications   Medication Sig Start Date End Date Taking? Authorizing Provider  fluconazole  (DIFLUCAN ) 150 MG tablet Take 1 tablet (150 mg total) by mouth every 3 (three) days for 2 doses. 04/16/24 04/20/24 Yes Iola Lukes, FNP  metroNIDAZOLE  (FLAGYL ) 500 MG tablet Take 1 tablet (500 mg total) by mouth 2  (two) times daily. 04/16/24  Yes Iola Lukes, FNP  nitrofurantoin , macrocrystal-monohydrate, (MACROBID ) 100 MG capsule Take 1 capsule (100 mg total) by mouth 2 (two) times daily. 04/16/24  Yes Iola Lukes, FNP  triamcinolone  cream (KENALOG ) 0.1 % Apply 1 Application topically 2 (two) times daily. Apply to affected areas two times a day until symptoms resolve for up to 2 weeks 04/16/24  Yes Iola Lukes, FNP  insulin  aspart (NOVOLOG ) 100 UNIT/ML injection For use in Omnipod. Current TDD is 68 units daily. Patient not taking: Reported on 04/25/2023 03/19/23   Lola Donnice HERO, MD  medroxyPROGESTERone  (PROVERA ) 10 MG tablet Take 1 tablet (10 mg total) by mouth daily. 06/24/19 08/18/19  Fredirick Glenys RAMAN, MD    Family History Family History  Problem Relation Age of Onset   Stroke Mother    Diabetes Mother    Hypertension Father    Diabetes Father    Seizures Brother     Social History Social History   Tobacco Use   Smoking status: Never   Smokeless tobacco: Never  Vaping Use   Vaping status: Never Used  Substance Use Topics   Alcohol  use: No   Drug use: No     Allergies   Latex and Penicillins   Review of Systems Review of Systems  Constitutional:  Negative for fever.  Gastrointestinal:  Positive for nausea and vomiting.  Genitourinary:  Positive for dysuria, frequency and vaginal discharge (white, thick). Negative for hematuria and menstrual problem (LMP around 02/23/24).       Vaginal itching and odor    Musculoskeletal:  Positive for back pain (lower).  Skin:  Positive for rash.  All other systems reviewed and are negative.    Physical Exam Triage Vital Signs ED Triage Vitals  Encounter Vitals Group     BP 04/16/24 1559 106/76     Girls Systolic BP Percentile --      Girls Diastolic BP Percentile --      Boys Systolic BP Percentile --      Boys Diastolic BP Percentile --      Pulse Rate 04/16/24 1559 89     Resp 04/16/24 1559 18     Temp 04/16/24  1559 98.1 F (36.7 C)     Temp Source 04/16/24 1559 Oral     SpO2 04/16/24 1559 96 %     Weight 04/16/24 1558 272 lb (123.4 kg)     Height 04/16/24 1558 5' 6 (1.676 m)     Head Circumference --      Peak Flow --      Pain Score 04/16/24 1556 9     Pain Loc --      Pain Education --      Exclude from Growth Chart --    No data found.  Updated Vital Signs BP 106/76 (BP Location: Left Wrist)   Pulse 89   Temp 98.1 F (36.7 C) (Oral)  Resp 18   Ht 5' 6 (1.676 m)   Wt 272 lb (123.4 kg)   LMP 03/03/2024 (Approximate)   SpO2 96%   Breastfeeding No   BMI 43.90 kg/m   Visual Acuity Right Eye Distance:   Left Eye Distance:   Bilateral Distance:    Right Eye Near:   Left Eye Near:    Bilateral Near:     Physical Exam Constitutional:      General: She is not in acute distress.    Appearance: Normal appearance. She is obese. She is not ill-appearing, toxic-appearing or diaphoretic.  HENT:     Head: Normocephalic.     Nose: Nose normal.     Mouth/Throat:     Mouth: Mucous membranes are moist.  Eyes:     Conjunctiva/sclera: Conjunctivae normal.  Cardiovascular:     Rate and Rhythm: Normal rate.  Pulmonary:     Effort: Pulmonary effort is normal.  Abdominal:     Palpations: Abdomen is soft.  Genitourinary:    Comments: Deferred; patient performed self-swab for Aptima testing  Musculoskeletal:        General: Normal range of motion.     Cervical back: Normal range of motion and neck supple.  Skin:    General: Skin is warm and dry.     Findings: Rash present.     Comments: Hyperpigmented macular rash noted to the face  Neurological:     General: No focal deficit present.     Mental Status: She is alert and oriented to person, place, and time.  Psychiatric:        Mood and Affect: Mood normal.        Behavior: Behavior normal.      UC Treatments / Results  Labs (all labs ordered are listed, but only abnormal results are displayed) Labs Reviewed  POCT  URINALYSIS DIP (MANUAL ENTRY) - Abnormal; Notable for the following components:      Result Value   Clarity, UA cloudy (*)    Glucose, UA >=1,000 (*)    Blood, UA trace-lysed (*)    Leukocytes, UA Small (1+) (*)    All other components within normal limits  POCT FASTING CBG [facility] MANUAL ENTRY - Abnormal; Notable for the following components:   POCT Glucose ([facility]) 337 (*)    All other components within normal limits  COMPREHENSIVE METABOLIC PANEL WITH GFR  HEMOGLOBIN A1C  POCT URINE PREGNANCY  CERVICOVAGINAL ANCILLARY ONLY    EKG   Radiology No results found.  Procedures Procedures (including critical care time)  Medications Ordered in UC Medications - No data to display  Initial Impression / Assessment and Plan / UC Course  I have reviewed the triage vital signs and the nursing notes.  Pertinent labs & imaging results that were available during my care of the patient were reviewed by me and considered in my medical decision making (see chart for details).     Patient is a 38 year old female with a history of type 2 diabetes mellitus presenting with multiple concerns. Her diabetes is uncontrolled, as she has not taken medication for approximately nine months since her last pregnancy. Prior therapies included insulin  and metformin , though she discontinued metformin  due to gastrointestinal intolerance. Current glucose is 337 mg/dL with glycosuria. She also reports recurrent yeast infections, likely related to poor glycemic control, and there is concern for possible kidney complications from prolonged uncontrolled diabetes. Labs including CMP and hemoglobin A1C were ordered, and education was provided on the importance  of carbohydrate monitoring, small frequent meals, and long-term management. Patient prefers to restart insulin  therapy rather than oral medications and was given information to establish care with a primary care provider for ongoing diabetes management and insulin   initiation.  She also presents with acute vaginitis, reporting burning with urination, vaginal itching, and white discharge. Swabs were obtained for BV, yeast, trichomonas, gonorrhea, and chlamydia testing. She was prescribed fluconazole  and metronidazole , with instructions to review results on MyChart and return if symptoms worsen or fail to improve.  Urinalysis findings were consistent with acute cystitis with hematuria. A urine culture was sent, and Macrobid  was prescribed pending final results.  Additionally, she reports new facial dermatitis following recent hair dye use. Given her uncontrolled diabetes, oral steroids were not prescribed. A topical steroid cream was initiated, and she was advised to avoid potential irritants and follow up if symptoms persist.  She was counseled on the importance of establishing primary care for long-term diabetes control, monitoring for kidney complications, and ongoing management of her current conditions. Emergency precautions were discussed, including return for uncontrolled hyperglycemia, persistent vomiting, altered mental status, chest pain, shortness of breath, worsening infection, or other acute concerns.  Today's evaluation has revealed no signs of a dangerous process. Discussed diagnosis with patient and/or guardian. Patient and/or guardian aware of their diagnosis, possible red flag symptoms to watch out for and need for close follow up. Patient and/or guardian understands verbal and written discharge instructions. Patient and/or guardian comfortable with plan and disposition.  Patient and/or guardian has a clear mental status at this time, good insight into illness (after discussion and teaching) and has clear judgment to make decisions regarding their care  Documentation was completed with the aid of voice recognition software. Transcription may contain typographical errors.  Final Clinical Impressions(s) / UC Diagnoses   Final diagnoses:   Glucosuria  Dysuria  Acute cystitis with hematuria  Vaginitis and vulvovaginitis  Uncontrolled type 2 diabetes mellitus with hyperglycemia (HCC)  Rash and nonspecific skin eruption     Discharge Instructions      You were seen today for several concerns, including uncontrolled diabetes, vaginal symptoms, urinary tract infection, and a new facial rash.  Your blood sugar today was very high at 337 mg/dL, and sugar was found in your urine. This is a sign that your diabetes is not well controlled. You have not been on medication for about nine months, and at this time you prefer to restart insulin  rather than oral medicines. Blood tests were ordered, including a hemoglobin A1C and metabolic panel, to check your kidney function and how well your diabetes has been controlled over time. You were given information to help you establish care with a primary care provider who can prescribe insulin  and follow you long-term. Until you are seen, you should work on eating smaller, more frequent meals, limiting foods high in sugar and refined carbohydrates, and drinking water throughout the day. Poor blood sugar control can also increase your risk for kidney problems, so follow up as soon as possible is important.  For your vaginal symptoms, a swab was collected to test for yeast, bacterial vaginosis, trichomonas, gonorrhea, and chlamydia. While awaiting results, you were prescribed fluconazole  for yeast and metronidazole  for possible bacterial vaginosis. Please check your MyChart account for results and you will only be contacted if additional treatment is needed.  Your urine showed signs of a bladder infection with blood present. A urine culture was sent, and you were prescribed Macrobid  to start today. Drink  plenty of water to keep your urine light yellow and try to empty your bladder fully each time you urinate.  For your facial rash you were prescribed a topical steroid cream. Apply only as directed, and  avoid other new products on your skin until the rash improves.  Go to the emergency department right away if you have very high blood sugars with nausea, vomiting, confusion, chest pain, shortness of breath, signs of severe infection such as fever and chills, inability to urinate, blood in your urine that worsens, spreading rash with swelling, or any other concerning changes in your health.     ED Prescriptions     Medication Sig Dispense Auth. Provider   metroNIDAZOLE  (FLAGYL ) 500 MG tablet Take 1 tablet (500 mg total) by mouth 2 (two) times daily. 14 tablet Congetta Odriscoll, [place], FNP   fluconazole  (DIFLUCAN ) 150 MG tablet Take 1 tablet (150 mg total) by mouth every 3 (three) days for 2 doses. 2 tablet Iola Lukes, FNP   triamcinolone  cream (KENALOG ) 0.1 % Apply 1 Application topically 2 (two) times daily. Apply to affected areas two times a day until symptoms resolve for up to 2 weeks 30 g Mackinzee Roszak, [place], FNP   nitrofurantoin , macrocrystal-monohydrate, (MACROBID ) 100 MG capsule Take 1 capsule (100 mg total) by mouth 2 (two) times daily. 10 capsule Iola Lukes, FNP      PDMP not reviewed this encounter.   Iola Lukes, [place] 04/16/24 [phone]

## 2024-04-16 NOTE — Discharge Instructions (Addendum)
 You were seen today for several concerns, including uncontrolled diabetes, vaginal symptoms, urinary tract infection, and a new facial rash.  Your blood sugar today was very high at 337 mg/dL, and sugar was found in your urine. This is a sign that your diabetes is not well controlled. You have not been on medication for about nine months, and at this time you prefer to restart insulin  rather than oral medicines. Blood tests were ordered, including a hemoglobin A1C and metabolic panel, to check your kidney function and how well your diabetes has been controlled over time. You were given information to help you establish care with a primary care provider who can prescribe insulin  and follow you long-term. Until you are seen, you should work on eating smaller, more frequent meals, limiting foods high in sugar and refined carbohydrates, and drinking water throughout the day. Poor blood sugar control can also increase your risk for kidney problems, so follow up as soon as possible is important.  For your vaginal symptoms, a swab was collected to test for yeast, bacterial vaginosis, trichomonas, gonorrhea, and chlamydia. While awaiting results, you were prescribed fluconazole  for yeast and metronidazole  for possible bacterial vaginosis. Please check your MyChart account for results and you will only be contacted if additional treatment is needed.  Your urine showed signs of a bladder infection with blood present. A urine culture was sent, and you were prescribed Macrobid  to start today. Drink plenty of water to keep your urine light yellow and try to empty your bladder fully each time you urinate.  For your facial rash you were prescribed a topical steroid cream. Apply only as directed, and avoid other new products on your skin until the rash improves.  Go to the emergency department right away if you have very high blood sugars with nausea, vomiting, confusion, chest pain, shortness of breath, signs of severe  infection such as fever and chills, inability to urinate, blood in your urine that worsens, spreading rash with swelling, or any other concerning changes in your health.

## 2024-04-17 ENCOUNTER — Ambulatory Visit (HOSPITAL_COMMUNITY): Payer: Self-pay

## 2024-04-17 LAB — CERVICOVAGINAL ANCILLARY ONLY
Bacterial Vaginitis (gardnerella): POSITIVE — AB
Candida Glabrata: NEGATIVE
Candida Vaginitis: POSITIVE — AB
Chlamydia: NEGATIVE
Comment: NEGATIVE
Comment: NEGATIVE
Comment: NEGATIVE
Comment: NEGATIVE
Comment: NEGATIVE
Comment: NORMAL
Neisseria Gonorrhea: NEGATIVE
Trichomonas: NEGATIVE

## 2024-04-19 LAB — HEMOGLOBIN A1C
Hgb A1c MFr Bld: 13.8 % — ABNORMAL HIGH (ref 4.8–5.6)
Mean Plasma Glucose: 349 mg/dL

## 2024-04-22 ENCOUNTER — Encounter (HOSPITAL_COMMUNITY): Payer: Self-pay | Admitting: Pharmacist

## 2024-04-22 ENCOUNTER — Other Ambulatory Visit: Payer: Self-pay

## 2024-04-22 ENCOUNTER — Other Ambulatory Visit (HOSPITAL_COMMUNITY): Payer: Self-pay

## 2024-04-22 DIAGNOSIS — F4322 Adjustment disorder with anxiety: Secondary | ICD-10-CM | POA: Diagnosis not present

## 2024-04-22 DIAGNOSIS — F419 Anxiety disorder, unspecified: Secondary | ICD-10-CM | POA: Diagnosis not present

## 2024-04-22 NOTE — Progress Notes (Signed)
 A1C significantly elevated. Renal function normal. Patient has history of diabetes and haven't been on medications for quite awhile. She should follow-up with a primary care provider as soon as possible for diabetes management.

## 2024-04-24 DIAGNOSIS — F419 Anxiety disorder, unspecified: Secondary | ICD-10-CM | POA: Diagnosis not present

## 2024-04-24 DIAGNOSIS — F4322 Adjustment disorder with anxiety: Secondary | ICD-10-CM | POA: Diagnosis not present

## 2024-04-27 DIAGNOSIS — F4322 Adjustment disorder with anxiety: Secondary | ICD-10-CM | POA: Diagnosis not present

## 2024-04-27 DIAGNOSIS — F419 Anxiety disorder, unspecified: Secondary | ICD-10-CM | POA: Diagnosis not present

## 2024-04-28 DIAGNOSIS — F419 Anxiety disorder, unspecified: Secondary | ICD-10-CM | POA: Diagnosis not present

## 2024-04-28 DIAGNOSIS — F4322 Adjustment disorder with anxiety: Secondary | ICD-10-CM | POA: Diagnosis not present

## 2024-05-06 DIAGNOSIS — F4322 Adjustment disorder with anxiety: Secondary | ICD-10-CM | POA: Diagnosis not present

## 2024-05-06 DIAGNOSIS — F419 Anxiety disorder, unspecified: Secondary | ICD-10-CM | POA: Diagnosis not present

## 2024-05-12 DIAGNOSIS — F4322 Adjustment disorder with anxiety: Secondary | ICD-10-CM | POA: Diagnosis not present

## 2024-05-12 DIAGNOSIS — F419 Anxiety disorder, unspecified: Secondary | ICD-10-CM | POA: Diagnosis not present

## 2024-05-13 DIAGNOSIS — F4322 Adjustment disorder with anxiety: Secondary | ICD-10-CM | POA: Diagnosis not present

## 2024-05-13 DIAGNOSIS — F419 Anxiety disorder, unspecified: Secondary | ICD-10-CM | POA: Diagnosis not present

## 2024-05-15 ENCOUNTER — Ambulatory Visit (HOSPITAL_COMMUNITY): Payer: Self-pay

## 2024-05-19 ENCOUNTER — Encounter (HOSPITAL_COMMUNITY): Payer: Self-pay

## 2024-05-19 ENCOUNTER — Ambulatory Visit (HOSPITAL_COMMUNITY)

## 2024-05-19 ENCOUNTER — Ambulatory Visit (HOSPITAL_COMMUNITY)
Admission: EM | Admit: 2024-05-19 | Discharge: 2024-05-19 | Disposition: A | Discharge status: Home / Self Care | Attending: Emergency Medicine | Admitting: Emergency Medicine

## 2024-05-19 DIAGNOSIS — E1165 Type 2 diabetes mellitus with hyperglycemia: Secondary | ICD-10-CM | POA: Insufficient documentation

## 2024-05-19 DIAGNOSIS — F419 Anxiety disorder, unspecified: Secondary | ICD-10-CM | POA: Diagnosis not present

## 2024-05-19 DIAGNOSIS — N926 Irregular menstruation, unspecified: Secondary | ICD-10-CM | POA: Diagnosis not present

## 2024-05-19 DIAGNOSIS — L281 Prurigo nodularis: Secondary | ICD-10-CM | POA: Diagnosis not present

## 2024-05-19 DIAGNOSIS — Z3202 Encounter for pregnancy test, result negative: Secondary | ICD-10-CM

## 2024-05-19 DIAGNOSIS — F4322 Adjustment disorder with anxiety: Secondary | ICD-10-CM | POA: Diagnosis not present

## 2024-05-19 LAB — POCT FASTING CBG KUC MANUAL ENTRY: POCT Glucose (KUC): 374 mg/dL — AB (ref 70–99)

## 2024-05-19 LAB — POCT URINE PREGNANCY: Preg Test, Ur: NEGATIVE

## 2024-05-19 MED ORDER — HYDROXYZINE HCL 25 MG PO TABS: 25.0000 mg | ORAL_TABLET | Freq: Four times a day (QID) | ORAL | 0 refills | Status: DC | PRN

## 2024-05-19 NOTE — Discharge Instructions (Addendum)
 Your pregnancy test is negative. Please follow up with your PCP as soon as possible.  You were offered Metformin  to help with diabetes, but declined use at present. Drink plenty of water Avoid pastas, cookies,cakes pies,soda,etc as your blood sugar is out of control.  Avoid heat hot water as it makes rashes worse, do not scratch or pick at skin, may take atarax  for itching, drowsiness precautions Check my chart for results, if you test positive and additional medications are needed we will call in treatment for you, if results are negative you will not be notified. Most liekly until you get your blood sugar under control you will have rash,itching, more prone to yeast infections.  If you have new or worsening symptoms go to Er for further evaluation(nausea,vomiting, confusion,etc)  Please call today to see if you can be worked in sooner for diabetes management.

## 2024-05-19 NOTE — ED Triage Notes (Addendum)
 Patient reports that she began having a rash to her back x 1 month and has now spread to her face and chest area as well.   Patient also c/o vaginal itching and a white vaginal discharge x 4 days. Patient denies any abdominal pain or urinary symptoms.  Patient states she has been Using Kenalog  cream to the ras on 04/16/24.  Pataient added that she has not had a period since August 1st and feels like she may be pregnant.

## 2024-05-19 NOTE — ED Provider Notes (Signed)
 [facility]    CSN: [id number] Arrival date & time: 05/19/24  1031      History   Chief Complaint Chief Complaint  Patient presents with   Rash   Vaginal Itching   Vaginal Discharge   Possible Pregnancy    HPI Monique Schmidt is a 38 y.o. female.   38 year old female, Monique Schmidt, presents to urgent care for evaluation of rash to back, neck and face x 1 month,  patient states areas itch and she has been scratching them.  Patient is also complaining of vaginal itching and white discharge for 4 days, patient denies any abdominal pain or urinary symptoms.  Patient states she has used Kenalog  cream for her rash since 04/16/2024.  Patient also states she has not her period is not as first and feels like she may be pregnant. Pt denies any new soaps or lotions or meds. Is aware she needs to see her PCP for diabetes management.  Pt has regular diabetes soda and crackers on counter.  The history is provided by the patient. No language interpreter was used.    Past Medical History:  Diagnosis Date   Abnormal maternal serum screening test 02/26/2023   HR due to low FF     Abnormal Pap smear    Bacterial vaginosis    Gestational diabetes    likely pre-exisiting; all pregnancies glyburide  started 11/4   Gestational thrombocytopenia    Headache(784.0)    Hernia, umbilical    History of abnormal cervical Pap smear    normal 2014   History of gestational diabetes 06/26/2019   Mild preeclampsia delivered 01/22/2016   Moderate persistent asthma 08/07/2013   Obesity complicating pregnancy 09/21/2013   Type 2 diabetes mellitus (HCC)     Patient Active Problem List   Diagnosis Date Noted   Missed period 05/19/2024   Picker's papule 05/19/2024   Pain in right knee 11/29/2023   Type 2 diabetes mellitus affecting pregnancy in third trimester, antepartum 05/20/2023   Uncontrolled diabetes mellitus with hyperglycemia (HCC) 04/26/2023   Trichomonal vaginitis during  pregnancy in third trimester 04/14/2023   Gestational thrombocytopenia 04/11/2023   Fetal growth restriction antepartum 03/01/2023   T13/T18+ on Panorama 02/26/2023   Uterine fibroid in pregnancy 12/20/2022   Chronic hypertension affecting pregnancy 12/12/2022   Type 2 diabetes mellitus affecting pregnancy, antepartum  insulin , oral 12/11/2022   Alpha thalassemia silent carrier 12/08/2022   AMA (advanced maternal age) multigravida 35+, unspecified trimester 11/19/2022   BMI 50.0-59.9, adult (HCC) 11/09/2013    Past Surgical History:  Procedure Laterality Date   UMBILICAL HERNIA REPAIR N/A 11/22/2014   Procedure: LAPAROSCOPIC UMBILICAL HERNIA;  Surgeon: Lynda Leos, MD;  Location: [facility];  Service: General;  Laterality: N/A;   WISDOM TOOTH EXTRACTION      OB History     Gravida  7   Para  6   Term  5   Preterm  1   AB  1   Living  6      SAB  1   IAB  0   Ectopic  0   Multiple  0   Live Births  6            Home Medications    Prior to Admission medications   Medication Sig Start Date End Date Taking? Authorizing Provider  hydrOXYzine  (ATARAX ) 25 MG tablet Take 1 tablet (25 mg total) by mouth every 6 (six) hours as needed for itching. 05/19/24  Yes Lajarvis Italiano, Rilla, NP  metroNIDAZOLE  (FLAGYL ) 500 MG tablet Take 1 tablet (500 mg total) by mouth 2 (two) times daily. Patient not taking: Reported on 05/19/2024 04/16/24   Murrill, Samantha, FNP  nitrofurantoin , macrocrystal-monohydrate, (MACROBID ) 100 MG capsule Take 1 capsule (100 mg total) by mouth 2 (two) times daily. Patient not taking: Reported on 05/19/2024 04/16/24   Iola Lukes, FNP  triamcinolone  cream (KENALOG ) 0.1 % Apply 1 Application topically 2 (two) times daily. Apply to affected areas two times a day until symptoms resolve for up to 2 weeks Patient not taking: Reported on 05/19/2024 04/16/24   Iola Lukes, FNP  insulin  aspart (NOVOLOG ) 100 UNIT/ML injection For use in Omnipod. Current  TDD is 68 units daily. Patient not taking: Reported on 04/25/2023 03/19/23   Lola Donnice HERO, MD  medroxyPROGESTERone  (PROVERA ) 10 MG tablet Take 1 tablet (10 mg total) by mouth daily. 06/24/19 08/18/19  Fredirick Glenys RAMAN, MD    Family History Family History  Problem Relation Age of Onset   Stroke Mother    Diabetes Mother    Hypertension Father    Diabetes Father    Seizures Brother     Social History Social History   Tobacco Use   Smoking status: Never   Smokeless tobacco: Never  Vaping Use   Vaping status: Never Used  Substance Use Topics   Alcohol  use: No   Drug use: No     Allergies   Latex and Penicillins   Review of Systems Review of Systems  Constitutional:  Negative for fever.  Genitourinary:  Positive for vaginal discharge.  Skin:  Positive for rash.  All other systems reviewed and are negative.    Physical Exam Triage Vital Signs ED Triage Vitals [05/19/24 1132]  Encounter Vitals Group     BP 128/88     Girls Systolic BP Percentile      Girls Diastolic BP Percentile      Boys Systolic BP Percentile      Boys Diastolic BP Percentile      Pulse Rate 84     Resp 16     Temp 98.5 F (36.9 C)     Temp Source Oral     SpO2 96 %     Weight      Height      Head Circumference      Peak Flow      Pain Score 0     Pain Loc      Pain Education      Exclude from Growth Chart    No data found.  Updated Vital Signs BP 128/88 (BP Location: Left Arm)   Pulse 84   Temp 98.5 F (36.9 C) (Oral)   Resp 16   LMP 03/20/2024 (Approximate)   SpO2 96%   Visual Acuity Right Eye Distance:   Left Eye Distance:   Bilateral Distance:    Right Eye Near:   Left Eye Near:    Bilateral Near:     Physical Exam Vitals and nursing note reviewed.  Constitutional:      Appearance: She is well-developed. She is morbidly obese.  Cardiovascular:     Rate and Rhythm: Normal rate and regular rhythm.     Heart sounds: Normal heart sounds.  Pulmonary:      Effort: Pulmonary effort is normal.     Comments: Rash is isolated to exposed areas(upper back,neck,face) Skin:    Findings: Lesion and rash present.  Neurological:     General: No  focal deficit present.     Mental Status: She is alert and oriented to person, place, and time.     GCS: GCS eye subscore is 4. GCS verbal subscore is 5. GCS motor subscore is 6.  Psychiatric:        Attention and Perception: Attention normal.        Mood and Affect: Mood normal.        Speech: Speech normal.        Behavior: Behavior normal.      UC Treatments / Results  Labs (all labs ordered are listed, but only abnormal results are displayed) Labs Reviewed  POCT FASTING CBG [facility] MANUAL ENTRY - Abnormal; Notable for the following components:      Result Value   POCT Glucose ([facility]) 374 (*)    All other components within normal limits  POCT URINE PREGNANCY  CERVICOVAGINAL ANCILLARY ONLY    EKG   Radiology No results found.  Procedures Procedures (including critical care time)  Medications Ordered in UC Medications - No data to display  Initial Impression / Assessment and Plan / UC Course  I have reviewed the triage vital signs and the nursing notes.  Pertinent labs & imaging results that were available during my care of the patient were reviewed by me and considered in my medical decision making (see chart for details).    Discussed exam findings and plan of care with patient, patient offered metformin  declines as it causes her to have a cast , patient is aware that she needs to get in as soon as possible to see her PCP for diabetes management , most likely needs insulin  , diabetes education , Atarax  given for itching , strict go to ER precautions given.   Patient verbalized understanding to this provider.  Ddx: With hyperglycemia, pickers papules, missed menses Final Clinical Impressions(s) / UC Diagnoses   Final diagnoses:  Uncontrolled type 2 diabetes mellitus with hyperglycemia (HCC)   Missed period  Picker's papule     Discharge Instructions      Your pregnancy test is negative. Please follow up with your PCP as soon as possible.  You were offered Metformin  to help with diabetes, but declined use at present. Drink plenty of water Avoid pastas, cookies,cakes pies,soda,etc as your blood sugar is out of control.  Avoid heat hot water as it makes rashes worse, do not scratch or pick at skin, may take atarax  for itching, drowsiness precautions Check my chart for results, if you test positive and additional medications are needed we will call in treatment for you, if results are negative you will not be notified. Most liekly until you get your blood sugar under control you will have rash,itching, more prone to yeast infections.  If you have new or worsening symptoms go to Er for further evaluation(nausea,vomiting, confusion,etc)  Please call today to see if you can be worked in sooner for diabetes management.       ED Prescriptions     Medication Sig Dispense Auth. Provider   hydrOXYzine  (ATARAX ) 25 MG tablet Take 1 tablet (25 mg total) by mouth every 6 (six) hours as needed for itching. 12 tablet Karnisha Lefebre, Rilla, NP      PDMP not reviewed this encounter.   Aminta Rilla, NP 05/19/24 [phone]

## 2024-05-20 ENCOUNTER — Ambulatory Visit (HOSPITAL_COMMUNITY): Payer: Self-pay

## 2024-05-20 DIAGNOSIS — F4322 Adjustment disorder with anxiety: Secondary | ICD-10-CM | POA: Diagnosis not present

## 2024-05-20 DIAGNOSIS — F419 Anxiety disorder, unspecified: Secondary | ICD-10-CM | POA: Diagnosis not present

## 2024-05-20 LAB — CERVICOVAGINAL ANCILLARY ONLY
Bacterial Vaginitis (gardnerella): NEGATIVE
Candida Glabrata: NEGATIVE
Candida Vaginitis: POSITIVE — AB
Chlamydia: NEGATIVE
Comment: NEGATIVE
Comment: NEGATIVE
Comment: NEGATIVE
Comment: NEGATIVE
Comment: NEGATIVE
Comment: NORMAL
Neisseria Gonorrhea: NEGATIVE
Trichomonas: NEGATIVE

## 2024-05-20 MED ORDER — FLUCONAZOLE 150 MG PO TABS: 150.0000 mg | ORAL_TABLET | Freq: Once | ORAL | 0 refills | Status: AC

## 2024-05-26 ENCOUNTER — Ambulatory Visit
Admission: EM | Admit: 2024-05-26 | Discharge: 2024-05-26 | Disposition: A | Discharge status: Home / Self Care | Attending: Family Medicine | Admitting: Family Medicine

## 2024-05-26 DIAGNOSIS — N760 Acute vaginitis: Secondary | ICD-10-CM | POA: Diagnosis not present

## 2024-05-26 DIAGNOSIS — E1165 Type 2 diabetes mellitus with hyperglycemia: Secondary | ICD-10-CM

## 2024-05-26 DIAGNOSIS — B369 Superficial mycosis, unspecified: Secondary | ICD-10-CM | POA: Diagnosis not present

## 2024-05-26 MED ORDER — FLUCONAZOLE 150 MG PO TABS: 150.0000 mg | ORAL_TABLET | ORAL | 0 refills | Status: AC

## 2024-05-26 MED ORDER — CLOTRIMAZOLE 1 % VA CREA: 1.0000 | TOPICAL_CREAM | Freq: Every day | VAGINAL | 0 refills | Status: DC

## 2024-05-26 NOTE — ED Triage Notes (Signed)
 Pt reports vaginal itchy x 2-3 days. Diflucan  gives no relief.

## 2024-05-26 NOTE — Discharge Instructions (Addendum)
 Please start fluconazole  once every 3 days to help with fungal skin infections, vaginal yeast infection.  You can also use clotrimazole  vaginally.  It is extremely important that you work with your PCP to manage her diabetes much more carefully.

## 2024-05-26 NOTE — ED Provider Notes (Signed)
 [facility]  Note:  This document was prepared using Conservation officer, historic buildings and may include unintentional dictation errors.  MRN: [id number] DOB: 03-May-1986  Subjective:   Monique Schmidt is a 38 y.o. female presenting for 3-day history of recurrent vaginal itching and irritation, discharge.  Patient has also had malodorous hyperpigmentation under the breasts and the left pannus.  Patient is a severely uncontrolled diabetic.  States that she cannot tolerate her diabetic medications.  She does have an appointment for follow-up with her PCP and OB/GYN within 1 month.    No current facility-administered medications for this encounter.  Current Outpatient Medications:    hydrOXYzine  (ATARAX ) 25 MG tablet, Take 1 tablet (25 mg total) by mouth every 6 (six) hours as needed for itching., Disp: 12 tablet, Rfl: 0   metroNIDAZOLE  (FLAGYL ) 500 MG tablet, Take 1 tablet (500 mg total) by mouth 2 (two) times daily. (Patient not taking: Reported on 05/19/2024), Disp: 14 tablet, Rfl: 0   nitrofurantoin , macrocrystal-monohydrate, (MACROBID ) 100 MG capsule, Take 1 capsule (100 mg total) by mouth 2 (two) times daily. (Patient not taking: Reported on 05/19/2024), Disp: 10 capsule, Rfl: 0   triamcinolone  cream (KENALOG ) 0.1 %, Apply 1 Application topically 2 (two) times daily. Apply to affected areas two times a day until symptoms resolve for up to 2 weeks (Patient not taking: Reported on 05/19/2024), Disp: 30 g, Rfl: 0   Allergies  Allergen Reactions   Latex Itching and Rash   Penicillins Itching and Rash    Did it involve swelling of the face/tongue/throat, SOB, or low BP? N Did it involve sudden or severe rash/hives, skin peeling, or any reaction on the inside of your mouth or nose? Y Did you need to seek medical attention at a hospital or doctor's office? No When did it last happen?  Several  Years Ago    If all above answers are NO, may proceed with cephalosporin  use.        Past Medical History:  Diagnosis Date   Abnormal maternal serum screening test 02/26/2023   HR due to low FF     Abnormal Pap smear    Bacterial vaginosis    Gestational diabetes    likely pre-exisiting; all pregnancies glyburide  started 11/4   Gestational thrombocytopenia    Headache(784.0)    Hernia, umbilical    History of abnormal cervical Pap smear    normal 2014   History of gestational diabetes 06/26/2019   Mild preeclampsia delivered 01/22/2016   Moderate persistent asthma 08/07/2013   Obesity complicating pregnancy 09/21/2013   Type 2 diabetes mellitus (HCC)      Past Surgical History:  Procedure Laterality Date   UMBILICAL HERNIA REPAIR N/A 11/22/2014   Procedure: LAPAROSCOPIC UMBILICAL HERNIA;  Surgeon: Lynda Leos, MD;  Location: [facility];  Service: General;  Laterality: N/A;   WISDOM TOOTH EXTRACTION      Family History  Problem Relation Age of Onset   Stroke Mother    Diabetes Mother    Hypertension Father    Diabetes Father    Seizures Brother     Social History   Tobacco Use   Smoking status: Never   Smokeless tobacco: Never  Vaping Use   Vaping status: Never Used  Substance Use Topics   Alcohol  use: No   Drug use: No    ROS   Objective:   Vitals: BP 103/76 (BP Location: Right Arm)   Pulse 77   Temp 97.8  F (36.6 C) (Oral)   Resp 18   LMP 03/20/2024 (Exact Date)   SpO2 95%   Breastfeeding No   Physical Exam Constitutional:      General: She is not in acute distress.    Appearance: Normal appearance. She is well-developed. She is not ill-appearing, toxic-appearing or diaphoretic.  HENT:     Head: Normocephalic and atraumatic.     Nose: Nose normal.     Mouth/Throat:     Mouth: Mucous membranes are moist.  Eyes:     General: No scleral icterus.       Right eye: No discharge.        Left eye: No discharge.     Extraocular Movements: Extraocular movements intact.  Cardiovascular:     Rate and Rhythm: Normal  rate.  Pulmonary:     Effort: Pulmonary effort is normal.  Skin:    General: Skin is warm and dry.     Findings: Rash (hyperpigmented rash with satellite lesions over the left abdominal pannus) present.  Neurological:     General: No focal deficit present.     Mental Status: She is alert and oriented to person, place, and time.  Psychiatric:        Mood and Affect: Mood normal.        Behavior: Behavior normal.     Recent Results (from the past 2160 hours)  Cervicovaginal ancillary only     Status: Abnormal   Collection Time: 03/04/24  4:58 PM  Result Value Ref Range   Neisseria Gonorrhea Negative    Chlamydia Negative    Trichomonas Negative    Bacterial Vaginitis (gardnerella) Negative    Candida Vaginitis Positive (A)    Candida Glabrata Negative    Comment      Normal Reference Range Bacterial Vaginosis - Negative   Comment Normal Reference Range Candida Species - Negative    Comment Normal Reference Range Candida Galbrata - Negative    Comment Normal Reference Range Trichomonas - Negative    Comment Normal Reference Ranger Chlamydia - Negative    Comment      Normal Reference Range Neisseria Gonorrhea - Negative  POCT urine pregnancy     Status: None   Collection Time: 03/04/24  5:06 PM  Result Value Ref Range   Preg Test, Ur Negative Negative  POC urinalysis dipstick     Status: Abnormal   Collection Time: 04/16/24  4:23 PM  Result Value Ref Range   Color, UA yellow yellow   Clarity, UA cloudy (A) clear   Glucose, UA >=1,000 (A) negative mg/dL   Bilirubin, UA negative negative   Ketones, POC UA negative negative mg/dL   Spec Grav, UA 8.989 8.989 - 1.025   Blood, UA trace-lysed (A) negative   pH, UA 6.0 5.0 - 8.0   Protein Ur, POC negative negative mg/dL   Urobilinogen, UA 0.2 0.2 or 1.0 E.U./dL   Nitrite, UA Negative Negative   Leukocytes, UA Small (1+) (A) Negative  POCT urine pregnancy     Status: None   Collection Time: 04/16/24  4:23 PM  Result Value  Ref Range   Preg Test, Ur Negative Negative  Cervicovaginal ancillary only     Status: Abnormal   Collection Time: 04/16/24  5:32 PM  Result Value Ref Range   Neisseria Gonorrhea Negative    Chlamydia Negative    Trichomonas Negative    Bacterial Vaginitis (gardnerella) Positive (A)    Candida Vaginitis Positive (A)  Candida Glabrata Negative    Comment Normal Reference Range Candida Species - Negative    Comment Normal Reference Range Candida Galbrata - Negative    Comment Normal Reference Range Trichomonas - Negative    Comment      Normal Reference Range Bacterial Vaginosis - Negative   Comment Normal Reference Ranger Chlamydia - Negative    Comment      Normal Reference Range Neisseria Gonorrhea - Negative  POC CBG monitoring     Status: Abnormal   Collection Time: 04/16/24  5:37 PM  Result Value Ref Range   POCT Glucose ([facility]) 337 (A) 70 - 99 mg/dL  Comprehensive metabolic panel     Status: Abnormal   Collection Time: 04/16/24  5:39 PM  Result Value Ref Range   Sodium 134 (L) 135 - 145 mmol/L   Potassium 3.8 3.5 - 5.1 mmol/L   Chloride 97 (L) 98 - 111 mmol/L   CO2 24 22 - 32 mmol/L   Glucose, Bld 337 (H) 70 - 99 mg/dL    Comment: Glucose reference range applies only to samples taken after fasting for at least 8 hours.   BUN 6 6 - 20 mg/dL   Creatinine, Ser 9.52 0.44 - 1.00 mg/dL   Calcium  9.1 8.9 - 10.3 mg/dL   Total Protein 6.8 6.5 - 8.1 g/dL   Albumin 3.3 (L) 3.5 - 5.0 g/dL   AST 18 15 - 41 U/L   ALT 12 0 - 44 U/L   Alkaline Phosphatase 65 38 - 126 U/L   Total Bilirubin 0.3 0.0 - 1.2 mg/dL   GFR, Estimated >39 >39 mL/min    Comment: (NOTE) Calculated using the CKD-EPI Creatinine Equation (2021)    Anion gap 13 5 - 15    Comment: Performed at [facility], [address] [place], [place] [address]  Hemoglobin A1c     Status: Abnormal   Collection Time: 04/16/24  5:39 PM  Result Value Ref Range   Hgb A1c MFr Bld 13.8 (H) 4.8 - 5.6 %    Comment: (NOTE)          Prediabetes: 5.7 - 6.4         Diabetes: >6.4         Glycemic control for adults with diabetes: <7.0    Mean Plasma Glucose 349 mg/dL    Comment: (NOTE) Performed At: [facility] [address] [place], [place] [id number] Jennette Shorter MD Ey:[id number]   POC CBG monitoring     Status: Abnormal   Collection Time: 05/19/24 12:06 PM  Result Value Ref Range   POCT Glucose ([facility]) 374 (A) 70 - 99 mg/dL  Cervicovaginal ancillary only     Status: Abnormal   Collection Time: 05/19/24 12:19 PM  Result Value Ref Range   Neisseria Gonorrhea Negative    Chlamydia Negative    Trichomonas Negative    Bacterial Vaginitis (gardnerella) Negative    Candida Vaginitis Positive (A)    Candida Glabrata Negative    Comment      Normal Reference Range Bacterial Vaginosis - Negative   Comment Normal Reference Range Candida Species - Negative    Comment Normal Reference Range Candida Galbrata - Negative    Comment Normal Reference Range Trichomonas - Negative    Comment Normal Reference Ranger Chlamydia - Negative    Comment      Normal Reference Range Neisseria Gonorrhea - Negative  POCT urine pregnancy     Status: None   Collection  Time: 05/19/24 12:22 PM  Result Value Ref Range   Preg Test, Ur Negative Negative     Assessment and Plan :   PDMP not reviewed this encounter.  1. Acute vaginitis   2. Fungal skin infection   3. Uncontrolled type 2 diabetes mellitus with hyperglycemia (HCC)     Patient is in need of an extended course of fluconazole  and therefore provided this for her.  Emphasized need for better diabetic control and close follow-up with her PCP.  Patient deferred testing.  Counseled patient on potential for adverse effects with medications prescribed/recommended today, ER and return-to-clinic precautions discussed, patient verbalized understanding.    Christopher Savannah, [place] 05/26/24 [id number]

## 2024-06-05 DIAGNOSIS — F4322 Adjustment disorder with anxiety: Secondary | ICD-10-CM | POA: Diagnosis not present

## 2024-06-05 DIAGNOSIS — F419 Anxiety disorder, unspecified: Secondary | ICD-10-CM | POA: Diagnosis not present

## 2024-06-08 ENCOUNTER — Other Ambulatory Visit (HOSPITAL_COMMUNITY): Payer: Self-pay

## 2024-06-10 ENCOUNTER — Other Ambulatory Visit: Payer: Self-pay

## 2024-06-10 ENCOUNTER — Encounter: Payer: Self-pay | Admitting: Family Medicine

## 2024-06-10 ENCOUNTER — Ambulatory Visit (INDEPENDENT_AMBULATORY_CARE_PROVIDER_SITE_OTHER): Admitting: Family Medicine

## 2024-06-10 ENCOUNTER — Other Ambulatory Visit (HOSPITAL_COMMUNITY)
Admission: RE | Admit: 2024-06-10 | Discharge: 2024-06-10 | Disposition: A | Source: Ambulatory Visit | Attending: Family Medicine | Admitting: Family Medicine

## 2024-06-10 VITALS — BP 121/90 | HR 88 | Wt 288.0 lb

## 2024-06-10 DIAGNOSIS — Z1231 Encounter for screening mammogram for malignant neoplasm of breast: Secondary | ICD-10-CM | POA: Diagnosis not present

## 2024-06-10 DIAGNOSIS — Z1331 Encounter for screening for depression: Secondary | ICD-10-CM

## 2024-06-10 DIAGNOSIS — Z01419 Encounter for gynecological examination (general) (routine) without abnormal findings: Secondary | ICD-10-CM | POA: Diagnosis not present

## 2024-06-10 DIAGNOSIS — Z124 Encounter for screening for malignant neoplasm of cervix: Secondary | ICD-10-CM | POA: Diagnosis present

## 2024-06-10 DIAGNOSIS — R21 Rash and other nonspecific skin eruption: Secondary | ICD-10-CM | POA: Diagnosis not present

## 2024-06-10 DIAGNOSIS — Z3202 Encounter for pregnancy test, result negative: Secondary | ICD-10-CM

## 2024-06-10 DIAGNOSIS — N912 Amenorrhea, unspecified: Secondary | ICD-10-CM

## 2024-06-10 DIAGNOSIS — F4322 Adjustment disorder with anxiety: Secondary | ICD-10-CM | POA: Diagnosis not present

## 2024-06-10 DIAGNOSIS — E1165 Type 2 diabetes mellitus with hyperglycemia: Secondary | ICD-10-CM

## 2024-06-10 DIAGNOSIS — F419 Anxiety disorder, unspecified: Secondary | ICD-10-CM | POA: Diagnosis not present

## 2024-06-10 LAB — POCT PREGNANCY, URINE: Preg Test, Ur: NEGATIVE

## 2024-06-10 MED ORDER — LANCET DEVICE MISC: 1.0000 | 0 refills | Status: AC

## 2024-06-10 MED ORDER — BLOOD GLUCOSE MONITORING SUPPL DEVI: 1.0000 | 0 refills | Status: DC

## 2024-06-10 MED ORDER — LANCETS MISC: 1.0000 | 0 refills | Status: AC

## 2024-06-10 MED ORDER — BLOOD GLUCOSE TEST VI STRP: 1.0000 | ORAL_STRIP | 0 refills | Status: AC

## 2024-06-10 NOTE — Assessment & Plan Note (Signed)
 00785 - Worsening  Sent in testing supplies Check A1C Other labs Would like to get back on her omnipod--but would need help of diabetes education +/- PCP

## 2024-06-10 NOTE — Progress Notes (Signed)
 Subjective:     Monique Schmidt is a 38 y.o. female and is here for a comprehensive physical exam. The patient reports problems - no diabetes care.Patient reports no meter, no CBG monitoring, on no meds. She has had normal menstrual cycles, but none x 2 months. Neg UPT early October. Reports rash on face. Got rx for steroid cream, which did not help, from UC. Rash there x 1 year, since birth of last baby.     The following portions of the patient's history were reviewed and updated as appropriate: allergies, current medications, past family history, past medical history, past social history, past surgical history, and problem list.  Review of Systems Pertinent items noted in HPI and remainder of comprehensive ROS otherwise negative.   Objective:  Chaperone present for exam   BP (!) 121/90   Pulse 88   Wt 288 lb (130.6 kg)   LMP 03/20/2024 (Exact Date)   BMI 46.48 kg/m  General appearance: alert, cooperative, and appears stated age Head: Normocephalic, without obvious abnormality, atraumatic Neck: no adenopathy, supple, symmetrical, trachea midline, and thyroid  not enlarged, symmetric, no tenderness/mass/nodules Lungs: clear to auscultation bilaterally Breasts: normal appearance, no masses or tenderness large pendulous breasts Heart: regular rate and rhythm, S1, S2 normal, no murmur, click, rub or gallop Abdomen: soft, non-tender; bowel sounds normal; no masses,  no organomegaly Pelvic: cervix normal in appearance, external genitalia normal, no adnexal masses or tenderness, no cervical motion tenderness, uterus normal size, shape, and consistency, and vagina normal without discharge Extremities: extremities normal, atraumatic, no cyanosis or edema Pulses: 2+ and symmetric Skin: Hyperpigmentation on face,neck, upper back, flat plaques, see photo Lymph nodes: Cervical, supraclavicular, and axillary nodes normal. Neurologic: Grossly normal         06/10/2024   10:46 AM 11/19/2022     3:18 PM 11/30/2019    2:42 PM  GAD 7 : Generalized Anxiety Score  Nervous, Anxious, on Edge 0 0 0  Control/stop worrying 0 0 0  Worry too much - different things 0 0 0  Trouble relaxing 0 0 1  Restless 0 0 0  Easily annoyed or irritable 0 0 0  Afraid - awful might happen 0 0 0  Total GAD 7 Score 0 0 1    Flowsheet Row Office Visit from 06/10/2024 in [facility] [employer] [facility]  PHQ-9 Total Score 0    Assessment:    GYN female exam.      Plan:   Problem List Items Addressed This Visit       Unprioritized   Uncontrolled diabetes mellitus with hyperglycemia (HCC)   99214 - Worsening  Sent in testing supplies Check A1C Other labs Would like to get back on her omnipod--but would need help of diabetes education +/- PCP      Relevant Medications   Blood Glucose Monitoring Suppl DEVI   Glucose Blood (BLOOD GLUCOSE TEST STRIPS) STRP   Lancet Device MISC   Lancets MISC   Other Relevant Orders   Comprehensive metabolic panel with GFR   Rash and nonspecific skin eruption   99214 - Worsening Unclear etiology Continue steroid cream Refer to derm      Relevant Orders   Ambulatory referral to Dermatology   Other Visit Diagnoses       Screening for malignant neoplasm of cervix    -  Primary   Pap smear today   Relevant Orders   Cytology - PAP( [facility])  Encounter for gynecological examination without abnormal finding       annual labs, declined flu, GAD7 and PHQ 9 scores reveiwed     Amenorrhea       Check TSH, PRL, FSH and HCG - she is worried she might be pregnant   Relevant Orders   TSH   CBC   Prolactin   Beta hCG quant (ref lab)     Encounter for screening mammogram for malignant neoplasm of breast       send for mammogram.   Relevant Orders   MM 3D SCREENING MAMMOGRAM BILATERAL BREAST      Return in 1 year (on 06/10/2025).    See After Visit Summary for Counseling Recommendations

## 2024-06-10 NOTE — Patient Instructions (Signed)

## 2024-06-10 NOTE — Assessment & Plan Note (Signed)
 00785 - Worsening Unclear etiology Continue steroid cream Refer to derm

## 2024-06-11 ENCOUNTER — Encounter: Payer: Self-pay | Admitting: *Deleted

## 2024-06-11 ENCOUNTER — Ambulatory Visit: Payer: Self-pay | Admitting: Family Medicine

## 2024-06-11 DIAGNOSIS — E1165 Type 2 diabetes mellitus with hyperglycemia: Secondary | ICD-10-CM

## 2024-06-11 LAB — PROLACTIN: Prolactin: 7.2 ng/mL (ref 4.8–33.4)

## 2024-06-11 LAB — CBC
Hematocrit: 44.2 % (ref 34.0–46.6)
Hemoglobin: 13.9 g/dL (ref 11.1–15.9)
MCH: 27.1 pg (ref 26.6–33.0)
MCHC: 31.4 g/dL — ABNORMAL LOW (ref 31.5–35.7)
MCV: 86 fL (ref 79–97)
Platelets: 149 x10E3/uL — ABNORMAL LOW (ref 150–450)
RBC: 5.12 x10E6/uL (ref 3.77–5.28)
RDW: 13.2 % (ref 11.7–15.4)
WBC: 5.6 x10E3/uL (ref 3.4–10.8)

## 2024-06-11 LAB — COMPREHENSIVE METABOLIC PANEL WITH GFR

## 2024-06-11 LAB — TSH: TSH: 0.78 u[IU]/mL (ref 0.450–4.500)

## 2024-06-11 LAB — BETA HCG QUANT (REF LAB): hCG Quant: <1 m[IU]/mL

## 2024-06-12 LAB — COMPREHENSIVE METABOLIC PANEL WITH GFR
ALT: 11 IU/L (ref 0–32)
AST: 12 IU/L (ref 0–40)
Albumin: 3.9 g/dL (ref 3.9–4.9)
Alkaline Phosphatase: 93 IU/L (ref 41–116)
BUN/Creatinine Ratio: 7 — ABNORMAL LOW (ref 9–23)
BUN: 5 mg/dL — ABNORMAL LOW (ref 6–24)
Bilirubin Total: 0.2 mg/dL (ref 0.0–1.2)
CO2: 22 mmol/L (ref 20–29)
Calcium: 9.3 mg/dL (ref 8.7–10.2)
Chloride: 100 mmol/L (ref 96–106)
Creatinine, Ser: 0.67 mg/dL (ref 0.57–1.00)
Globulin, Total: 2.9 g/dL (ref 1.5–4.5)
Glucose: 377 mg/dL — ABNORMAL HIGH (ref 70–99)
Potassium: 4 mmol/L (ref 3.5–5.2)
Sodium: 138 mmol/L (ref 134–144)
Total Protein: 6.8 g/dL (ref 6.0–8.5)
eGFR: 113 mL/min/1.73 (ref >59)

## 2024-06-12 LAB — SPECIMEN STATUS REPORT

## 2024-06-13 DIAGNOSIS — F419 Anxiety disorder, unspecified: Secondary | ICD-10-CM | POA: Diagnosis not present

## 2024-06-13 DIAGNOSIS — F4322 Adjustment disorder with anxiety: Secondary | ICD-10-CM | POA: Diagnosis not present

## 2024-06-15 NOTE — Progress Notes (Signed)
 HPV add on order form faxed to [facility].  Fax confirmation received.   [place], RN

## 2024-06-16 ENCOUNTER — Other Ambulatory Visit

## 2024-06-16 ENCOUNTER — Other Ambulatory Visit: Payer: Self-pay

## 2024-06-16 DIAGNOSIS — E1165 Type 2 diabetes mellitus with hyperglycemia: Secondary | ICD-10-CM

## 2024-06-16 LAB — CYTOLOGY - PAP
Adequacy: ABSENT
Comment: NEGATIVE
Diagnosis: NEGATIVE
High risk HPV: NEGATIVE

## 2024-06-16 MED ORDER — ACCU-CHEK GUIDE W/DEVICE KIT: 1.0000 | PACK | 0 refills | Status: AC | PRN

## 2024-06-17 ENCOUNTER — Ambulatory Visit: Payer: Self-pay | Admitting: Family Medicine

## 2024-06-17 DIAGNOSIS — E1165 Type 2 diabetes mellitus with hyperglycemia: Secondary | ICD-10-CM

## 2024-06-17 LAB — COMPREHENSIVE METABOLIC PANEL WITH GFR
ALT: 12 IU/L (ref 0–32)
AST: 12 IU/L (ref 0–40)
Albumin: 3.9 g/dL (ref 3.9–4.9)
Alkaline Phosphatase: 78 IU/L (ref 41–116)
BUN/Creatinine Ratio: 13 (ref 9–23)
BUN: 10 mg/dL (ref 6–24)
Bilirubin Total: 0.3 mg/dL (ref 0.0–1.2)
CO2: 23 mmol/L (ref 20–29)
Calcium: 9 mg/dL (ref 8.7–10.2)
Chloride: 97 mmol/L (ref 96–106)
Creatinine, Ser: 0.77 mg/dL (ref 0.57–1.00)
Globulin, Total: 2.9 g/dL (ref 1.5–4.5)
Glucose: 372 mg/dL — ABNORMAL HIGH (ref 70–99)
Potassium: 3.9 mmol/L (ref 3.5–5.2)
Sodium: 136 mmol/L (ref 134–144)
Total Protein: 6.8 g/dL (ref 6.0–8.5)
eGFR: 100 mL/min/1.73 (ref >59)

## 2024-06-17 LAB — HEMOGLOBIN A1C
Est. average glucose Bld gHb Est-mCnc: 355 mg/dL
Hgb A1c MFr Bld: 14 % — ABNORMAL HIGH (ref 4.8–5.6)

## 2024-06-22 ENCOUNTER — Encounter: Payer: Self-pay | Admitting: Radiology

## 2024-06-24 NOTE — Telephone Encounter (Signed)
 Patient has Diabetes appointment scheduled 07/09/24 with other provider.

## 2024-06-25 DIAGNOSIS — F4322 Adjustment disorder with anxiety: Secondary | ICD-10-CM | POA: Diagnosis not present

## 2024-06-25 DIAGNOSIS — F419 Anxiety disorder, unspecified: Secondary | ICD-10-CM | POA: Diagnosis not present

## 2024-06-30 ENCOUNTER — Ambulatory Visit
Admission: RE | Admit: 2024-06-30 | Discharge: 2024-06-30 | Disposition: A | Discharge status: Home / Self Care | Source: Ambulatory Visit | Attending: Family Medicine | Admitting: Family Medicine

## 2024-06-30 DIAGNOSIS — Z1231 Encounter for screening mammogram for malignant neoplasm of breast: Secondary | ICD-10-CM | POA: Diagnosis not present

## 2024-07-02 ENCOUNTER — Ambulatory Visit
Admission: EM | Admit: 2024-07-02 | Discharge: 2024-07-02 | Disposition: A | Discharge status: Home / Self Care | Attending: Student | Admitting: Student

## 2024-07-02 ENCOUNTER — Encounter: Payer: Self-pay | Admitting: Emergency Medicine

## 2024-07-02 DIAGNOSIS — F419 Anxiety disorder, unspecified: Secondary | ICD-10-CM | POA: Diagnosis not present

## 2024-07-02 DIAGNOSIS — J029 Acute pharyngitis, unspecified: Secondary | ICD-10-CM | POA: Diagnosis not present

## 2024-07-02 DIAGNOSIS — F4322 Adjustment disorder with anxiety: Secondary | ICD-10-CM | POA: Diagnosis not present

## 2024-07-02 LAB — POCT RAPID STREP A (OFFICE): Rapid Strep A Screen: NEGATIVE

## 2024-07-02 MED ORDER — AZELASTINE HCL 0.1 % NA SOLN: 2.0000 | Freq: Two times a day (BID) | NASAL | 2 refills | Status: AC

## 2024-07-02 MED ORDER — PROMETHAZINE-DM 6.25-15 MG/5ML PO SYRP: 5.0000 mL | ORAL_SOLUTION | Freq: Four times a day (QID) | ORAL | 0 refills | Status: AC | PRN

## 2024-07-02 NOTE — ED Notes (Addendum)
 Pt on phone. Unable to complete triage at this time. Will return in a few minutes

## 2024-07-02 NOTE — ED Triage Notes (Signed)
 Pt c/o sore throat that comes and goes, bilateral ear pain, and cough for 2-3days

## 2024-07-02 NOTE — ED Provider Notes (Addendum)
 [facility]    CSN: [id number] Arrival date & time: 07/02/24  1435      History   Chief Complaint Chief Complaint  Patient presents with   Sore Throat   Otalgia    HPI Monique Schmidt is a 38 y.o. female presenting with sore throat and cough x2 days. Pt c/o sore throat that comes and goes, bilateral ear pain, and cough for 2-3 days.  Denies shortness of breath, chest tightness, fevers.  Has not attempted medications for the symptoms at home.  LMP 1 week ago  HPI  Past Medical History:  Diagnosis Date   Abnormal maternal serum screening test 02/26/2023   HR due to low FF     Abnormal Pap smear    Bacterial vaginosis    Gestational diabetes    likely pre-exisiting; all pregnancies glyburide  started 11/4   Gestational thrombocytopenia    Headache(784.0)    Hernia, umbilical    History of abnormal cervical Pap smear    normal 2014   History of gestational diabetes 06/26/2019   Mild preeclampsia delivered 01/22/2016   Moderate persistent asthma 08/07/2013   Obesity complicating pregnancy 09/21/2013   Type 2 diabetes mellitus [facility])     Patient Active Problem List   Diagnosis Date Noted   Rash and nonspecific skin eruption 06/10/2024   Missed period 05/19/2024   Picker's papule 05/19/2024   Pain in right knee 11/29/2023   Uncontrolled diabetes mellitus with hyperglycemia (HCC) 04/26/2023   Trichomonal vaginitis during pregnancy in third trimester 04/14/2023   Gestational thrombocytopenia 04/11/2023   Fetal growth restriction antepartum 03/01/2023   T13/T18+ on Panorama 02/26/2023   Uterine fibroid in pregnancy 12/20/2022   Benign essential hypertension 12/12/2022   Alpha thalassemia silent carrier 12/08/2022   AMA (advanced maternal age) multigravida 35+, unspecified trimester 11/19/2022   BMI 50.0-59.9, adult (HCC) 11/09/2013    Past Surgical History:  Procedure Laterality Date   UMBILICAL HERNIA REPAIR N/A 11/22/2014   Procedure: LAPAROSCOPIC  UMBILICAL HERNIA;  Surgeon: Lynda Leos, MD;  Location: [facility] OR;  Service: General;  Laterality: N/A;   WISDOM TOOTH EXTRACTION      OB History     Gravida  7   Para  6   Term  5   Preterm  1   AB  1   Living  6      SAB  1   IAB  0   Ectopic  0   Multiple  0   Live Births  6            Home Medications    Prior to Admission medications   Medication Sig Start Date End Date Taking? Authorizing Provider  azelastine (ASTELIN) 0.1 % nasal spray Place 2 sprays into both nostrils 2 (two) times daily. Use in each nostril as directed 07/02/24  Yes Arlyss Leita BRAVO, PA-C  promethazine -dextromethorphan (PROMETHAZINE -DM) 6.25-15 MG/5ML syrup Take 5 mLs by mouth 4 (four) times daily as needed for cough. 07/02/24  Yes Jacobi Ryant E, PA-C  Blood Glucose Monitoring Suppl (ACCU-CHEK GUIDE) w/Device KIT 1 Device by Does not apply route as needed. 06/16/24   Fredirick Glenys RAMAN, MD  fluconazole  (DIFLUCAN ) 150 MG tablet Take 1 tablet (150 mg total) by mouth every 3 (three) days. 05/26/24   Christopher Savannah, PA-C  Glucose Blood (BLOOD GLUCOSE TEST STRIPS) STRP 1 each by Does not apply route as directed. Dispense based on patient and insurance preference. Use up to four times daily as directed. (  FOR ICD-10 E10.9, E11.9). 06/10/24   Fredirick Glenys RAMAN, MD  Lancet Device MISC 1 each by Does not apply route as directed. Dispense based on patient and insurance preference. Use up to four times daily as directed. (FOR ICD-10 E10.9, E11.9). 06/10/24   Fredirick Glenys RAMAN, MD  Lancets MISC 1 each by Does not apply route as directed. Dispense based on patient and insurance preference. Use up to four times daily as directed. (FOR ICD-10 E10.9, E11.9). 06/10/24   Fredirick Glenys RAMAN, MD  triamcinolone  cream (KENALOG ) 0.1 % Apply 1 Application topically 2 (two) times daily. Apply to affected areas two times a day until symptoms resolve for up to 2 weeks Patient not taking: Reported on 06/10/2024 04/16/24   Iola Lukes, FNP  insulin  aspart (NOVOLOG ) 100 UNIT/ML injection For use in Omnipod. Current TDD is 68 units daily. Patient not taking: Reported on 04/25/2023 03/19/23   Lola Donnice HERO, MD  medroxyPROGESTERone  (PROVERA ) 10 MG tablet Take 1 tablet (10 mg total) by mouth daily. 06/24/19 08/18/19  Fredirick Glenys RAMAN, MD    Family History Family History  Problem Relation Age of Onset   Stroke Mother    Diabetes Mother    Hypertension Father    Diabetes Father    Seizures Brother     Social History Social History   Tobacco Use   Smoking status: Never   Smokeless tobacco: Never  Vaping Use   Vaping status: Never Used  Substance Use Topics   Alcohol  use: No   Drug use: No     Allergies   Latex and Penicillins   Review of Systems Review of Systems  Constitutional:  Negative for appetite change, chills and fever.  HENT:  Positive for congestion and sore throat. Negative for ear pain, rhinorrhea, sinus pressure and sinus pain.   Eyes:  Negative for redness and visual disturbance.  Respiratory:  Positive for cough. Negative for chest tightness, shortness of breath and wheezing.   Cardiovascular:  Negative for chest pain and palpitations.  Gastrointestinal:  Negative for abdominal pain, constipation, diarrhea, nausea and vomiting.  Genitourinary:  Negative for dysuria, frequency and urgency.  Musculoskeletal:  Negative for myalgias.  Neurological:  Negative for dizziness, weakness and headaches.  Psychiatric/Behavioral:  Negative for confusion.   All other systems reviewed and are negative.    Physical Exam Triage Vital Signs ED Triage Vitals  Encounter Vitals Group     BP 07/02/24 1520 (!) 130/97     Girls Systolic BP Percentile --      Girls Diastolic BP Percentile --      Boys Systolic BP Percentile --      Boys Diastolic BP Percentile --      Pulse Rate 07/02/24 1520 86     Resp 07/02/24 1520 17     Temp 07/02/24 1520 98.1 F (36.7 C)     Temp Source 07/02/24 1520 Oral      SpO2 07/02/24 1520 96 %     Weight --      Height --      Head Circumference --      Peak Flow --      Pain Score 07/02/24 1519 3     Pain Loc --      Pain Education --      Exclude from Growth Chart --    No data found.  Updated Vital Signs BP (!) 130/97 (BP Location: Right Arm)   Pulse 86   Temp 98.1 F (36.7  C) (Oral)   Resp 17   SpO2 96%   Breastfeeding No   Visual Acuity Right Eye Distance:   Left Eye Distance:   Bilateral Distance:    Right Eye Near:   Left Eye Near:    Bilateral Near:     Physical Exam Vitals reviewed.  Constitutional:      General: She is not in acute distress.    Appearance: Normal appearance. She is not ill-appearing.  HENT:     Head: Normocephalic and atraumatic.     Right Ear: Tympanic membrane, ear canal and external ear normal. No tenderness. No middle ear effusion. There is no impacted cerumen. Tympanic membrane is not perforated, erythematous, retracted or bulging.     Left Ear: Tympanic membrane, ear canal and external ear normal. No tenderness.  No middle ear effusion. There is no impacted cerumen. Tympanic membrane is not perforated, erythematous, retracted or bulging.     Nose: Nose normal. No congestion.     Mouth/Throat:     Mouth: Mucous membranes are moist.     Pharynx: Uvula midline. No oropharyngeal exudate or posterior oropharyngeal erythema.     Tonsils: No tonsillar exudate. 0 on the right. 0 on the left.  Eyes:     Extraocular Movements: Extraocular movements intact.     Pupils: Pupils are equal, round, and reactive to light.  Cardiovascular:     Rate and Rhythm: Normal rate and regular rhythm.     Heart sounds: Normal heart sounds.  Pulmonary:     Effort: Pulmonary effort is normal.     Breath sounds: Normal breath sounds. No decreased breath sounds, wheezing, rhonchi or rales.  Abdominal:     Palpations: Abdomen is soft.     Tenderness: There is no abdominal tenderness. There is no guarding or rebound.   Lymphadenopathy:     Cervical: No cervical adenopathy.     Right cervical: No superficial cervical adenopathy.    Left cervical: No superficial cervical adenopathy.  Neurological:     General: No focal deficit present.     Mental Status: She is alert and oriented to person, place, and time.  Psychiatric:        Mood and Affect: Mood normal.        Behavior: Behavior normal.        Thought Content: Thought content normal.        Judgment: Judgment normal.      UC Treatments / Results  Labs (all labs ordered are listed, but only abnormal results are displayed) Labs Reviewed  CULTURE, GROUP A STREP [facility])  POCT RAPID STREP A (OFFICE)    EKG   Radiology No results found.  Procedures Procedures (including critical care time)  Medications Ordered in UC Medications - No data to display  Initial Impression / Assessment and Plan / UC Course  I have reviewed the triage vital signs and the nursing notes.  Pertinent labs & imaging results that were available during my care of the patient were reviewed by me and considered in my medical decision making (see chart for details).      Patient is a 38 year old female presenting with viral URI with cough The patient is afebrile and nontachycardic.  Antipyretic has not been administered today.  Strep negative, culture sent. LMP 1 week ago, states she is not pregnant.  Promethazine  DM, azelastine, and over-the-counter medications as below.   Final Clinical Impressions(s) / UC Diagnoses   Final diagnoses:  Viral pharyngitis  Discharge Instructions      -Your strep test was negative. -Promethazine  DM cough syrup for congestion/cough. This could make you drowsy, so take at night before bed. - Use the azelastine nasal spray for nasal congestion, 2 sprays twice daily while symptoms persist. -Your cough should slowly get better instead of worse. If you develop a cough productive of dark or red sputum, new shortness of  breath, new chest tightness, new fevers, etc - seek additional care.      ED Prescriptions     Medication Sig Dispense Auth. Provider   promethazine -dextromethorphan (PROMETHAZINE -DM) 6.25-15 MG/5ML syrup Take 5 mLs by mouth 4 (four) times daily as needed for cough. 118 mL Rainie Crenshaw E, PA-C   azelastine (ASTELIN) 0.1 % nasal spray Place 2 sprays into both nostrils 2 (two) times daily. Use in each nostril as directed 30 mL Arlyss Leita BRAVO, PA-C      PDMP not reviewed this encounter.   Arlyss Leita BRAVO, PA-C 07/02/24 1546    Arlyss Leita BRAVO, PA-C 07/02/24 [id number]

## 2024-07-02 NOTE — Discharge Instructions (Addendum)
-  Your strep test was negative. -Promethazine  DM cough syrup for congestion/cough. This could make you drowsy, so take at night before bed. - Use the azelastine nasal spray for nasal congestion, 2 sprays twice daily while symptoms persist. -Your cough should slowly get better instead of worse. If you develop a cough productive of dark or red sputum, new shortness of breath, new chest tightness, new fevers, etc - seek additional care.

## 2024-07-05 LAB — CULTURE, GROUP A STREP (THRC)

## 2024-07-06 ENCOUNTER — Ambulatory Visit (HOSPITAL_COMMUNITY): Payer: Self-pay

## 2024-07-06 ENCOUNTER — Ambulatory Visit (INDEPENDENT_AMBULATORY_CARE_PROVIDER_SITE_OTHER)

## 2024-07-06 DIAGNOSIS — L281 Prurigo nodularis: Secondary | ICD-10-CM

## 2024-07-06 MED ORDER — TACROLIMUS 0.1 % EX OINT
TOPICAL_OINTMENT | CUTANEOUS | 1 refills | Status: AC
Start: 2024-07-06 — End: ?

## 2024-07-06 MED ORDER — CLOBETASOL PROPIONATE 0.05 % EX OINT: TOPICAL_OINTMENT | CUTANEOUS | 1 refills | Status: AC

## 2024-07-06 NOTE — Patient Instructions (Signed)

## 2024-07-06 NOTE — Progress Notes (Signed)
    Subjective   Monique Schmidt is a 38 y.o. female who presents for the following: Rash on her face and back for ~ 7 months leaving discoloration on her skin. Patient report skin sometimes itch.  Patient is new patient   Review of Systems:    No other skin or systemic complaints except as noted in HPI or Assessment and Plan.  The following portions of the chart were reviewed this encounter and updated as appropriate: medications, allergies, medical history  Relevant Medical History:     Objective  (SKPE) Well appearing patient in no apparent distress; mood and affect are within normal limits. Examination was performed of the: Focused Exam of: face,chest,back    Examination notable for: - Nodular lesion with hyperpigmentation and slight scale. 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 Condition is symptomatic and bothersome to patient. Patient is flaring and not currently at treatment goal.  - Discussed diagnosis, typical course, and treatment options for this condition - Discussed itch scratch cycle and risk of kobenerization - Advised against excoriation (discussed itch-scratch cycle), keep nails short - Mosturization with Eucerin or Vaseline (purchase Cerave if desired), lukewarm showers, Dove sensitive skin soap to axilla/groin - Start clobetasol 0.05% ointment to nodules up to BID on body  -start Tacrolimus ointment apply to face up to bid  - RTC in 4 weeks - recommended  Dupixent, nemolizumab if not imrpvoed      Procedures, orders, diagnosis for this visit:    There are no diagnoses linked to this encounter.  Return to clinic: Return in about 6 weeks (around 08/17/2024) for Prurigo nodularis .  IFay Kirks, CMA, am acting as scribe for Lauraine JAYSON Kanaris, MD .   Documentation: I have reviewed the above  documentation for accuracy and completeness, and I agree with the above.  Lauraine JAYSON Kanaris, MD

## 2024-07-08 ENCOUNTER — Encounter: Payer: Self-pay | Admitting: *Deleted

## 2024-07-09 ENCOUNTER — Ambulatory Visit: Payer: Self-pay | Admitting: Family Medicine

## 2024-07-09 DIAGNOSIS — F4322 Adjustment disorder with anxiety: Secondary | ICD-10-CM | POA: Diagnosis not present

## 2024-07-09 DIAGNOSIS — F419 Anxiety disorder, unspecified: Secondary | ICD-10-CM | POA: Diagnosis not present

## 2024-07-14 DIAGNOSIS — F4322 Adjustment disorder with anxiety: Secondary | ICD-10-CM | POA: Diagnosis not present

## 2024-07-14 DIAGNOSIS — F419 Anxiety disorder, unspecified: Secondary | ICD-10-CM | POA: Diagnosis not present

## 2024-07-22 DIAGNOSIS — F419 Anxiety disorder, unspecified: Secondary | ICD-10-CM | POA: Diagnosis not present

## 2024-07-22 DIAGNOSIS — F4322 Adjustment disorder with anxiety: Secondary | ICD-10-CM | POA: Diagnosis not present

## 2024-07-25 DIAGNOSIS — F419 Anxiety disorder, unspecified: Secondary | ICD-10-CM | POA: Diagnosis not present

## 2024-07-25 DIAGNOSIS — F4322 Adjustment disorder with anxiety: Secondary | ICD-10-CM | POA: Diagnosis not present

## 2024-08-17 ENCOUNTER — Ambulatory Visit

## 2024-09-03 ENCOUNTER — Ambulatory Visit

## 2024-09-10 ENCOUNTER — Ambulatory Visit: Admitting: Family Medicine

## 2025-01-26 ENCOUNTER — Ambulatory Visit: Admitting: Dermatology
